# Patient Record
Sex: Male | Born: 1940
Health system: Southern US, Community
[De-identification: ages and names within clinical notes are randomized; demographics above are authoritative.]

## PROBLEM LIST (undated history)

## (undated) DIAGNOSIS — M1990 Unspecified osteoarthritis, unspecified site: Secondary | ICD-10-CM

## (undated) DIAGNOSIS — I3139 Other pericardial effusion (noninflammatory): Secondary | ICD-10-CM

## (undated) DIAGNOSIS — C61 Malignant neoplasm of prostate: Secondary | ICD-10-CM

## (undated) DIAGNOSIS — I471 Supraventricular tachycardia, unspecified: Secondary | ICD-10-CM

## (undated) DIAGNOSIS — I10 Essential (primary) hypertension: Secondary | ICD-10-CM

## (undated) DIAGNOSIS — E119 Type 2 diabetes mellitus without complications: Secondary | ICD-10-CM

## (undated) DIAGNOSIS — H409 Unspecified glaucoma: Secondary | ICD-10-CM

## (undated) DIAGNOSIS — Z87442 Personal history of urinary calculi: Secondary | ICD-10-CM

## (undated) DIAGNOSIS — R519 Headache, unspecified: Secondary | ICD-10-CM

## (undated) DIAGNOSIS — I480 Paroxysmal atrial fibrillation: Secondary | ICD-10-CM

## (undated) DIAGNOSIS — I313 Pericardial effusion (noninflammatory): Secondary | ICD-10-CM

## (undated) DIAGNOSIS — M316 Other giant cell arteritis: Secondary | ICD-10-CM

## (undated) DIAGNOSIS — I499 Cardiac arrhythmia, unspecified: Secondary | ICD-10-CM

## (undated) DIAGNOSIS — I2510 Atherosclerotic heart disease of native coronary artery without angina pectoris: Secondary | ICD-10-CM

## (undated) DIAGNOSIS — I214 Non-ST elevation (NSTEMI) myocardial infarction: Secondary | ICD-10-CM

## (undated) DIAGNOSIS — R51 Headache: Secondary | ICD-10-CM

## (undated) DIAGNOSIS — K219 Gastro-esophageal reflux disease without esophagitis: Secondary | ICD-10-CM

## (undated) DIAGNOSIS — M109 Gout, unspecified: Secondary | ICD-10-CM

## (undated) DIAGNOSIS — D649 Anemia, unspecified: Secondary | ICD-10-CM

## (undated) HISTORY — PX: FRACTURE SURGERY: SHX138

## (undated) HISTORY — DX: Pericardial effusion (noninflammatory): I31.3

## (undated) HISTORY — PX: KNEE ARTHROSCOPY: SHX127

## (undated) HISTORY — DX: Supraventricular tachycardia, unspecified: I47.10

## (undated) HISTORY — DX: Essential (primary) hypertension: I10

## (undated) HISTORY — DX: Paroxysmal atrial fibrillation: I48.0

## (undated) HISTORY — DX: Malignant neoplasm of prostate: C61

## (undated) HISTORY — DX: Other giant cell arteritis: M31.6

## (undated) HISTORY — DX: Other pericardial effusion (noninflammatory): I31.39

## (undated) HISTORY — DX: Atherosclerotic heart disease of native coronary artery without angina pectoris: I25.10

## (undated) HISTORY — DX: Supraventricular tachycardia: I47.1

---

## 2001-03-27 HISTORY — PX: ORIF TIBIA & FIBULA FRACTURES: SHX2131

## 2005-03-27 DIAGNOSIS — C61 Malignant neoplasm of prostate: Secondary | ICD-10-CM

## 2005-03-27 HISTORY — PX: INSERTION PROSTATE RADIATION SEED: SUR718

## 2005-03-27 HISTORY — PX: PERICARDIOCENTESIS: SHX2215

## 2005-03-27 HISTORY — DX: Malignant neoplasm of prostate: C61

## 2005-08-11 ENCOUNTER — Ambulatory Visit: Admission: RE | Admit: 2005-08-11 | Discharge: 2005-11-09 | Payer: Self-pay | Admitting: Radiation Oncology

## 2005-09-21 ENCOUNTER — Encounter: Admission: RE | Admit: 2005-09-21 | Discharge: 2005-09-21 | Payer: Self-pay | Admitting: Urology

## 2005-11-01 ENCOUNTER — Ambulatory Visit (HOSPITAL_BASED_OUTPATIENT_CLINIC_OR_DEPARTMENT_OTHER): Admission: RE | Admit: 2005-11-01 | Discharge: 2005-11-01 | Payer: Self-pay | Admitting: Urology

## 2005-11-23 ENCOUNTER — Ambulatory Visit: Admission: RE | Admit: 2005-11-23 | Discharge: 2005-12-29 | Payer: Self-pay | Admitting: Radiation Oncology

## 2006-03-14 ENCOUNTER — Ambulatory Visit: Payer: Self-pay | Admitting: Cardiology

## 2006-04-10 ENCOUNTER — Ambulatory Visit: Payer: Self-pay | Admitting: Cardiology

## 2006-04-27 ENCOUNTER — Ambulatory Visit: Payer: Self-pay | Admitting: Cardiology

## 2006-05-31 ENCOUNTER — Ambulatory Visit: Payer: Self-pay | Admitting: Cardiology

## 2007-02-08 ENCOUNTER — Ambulatory Visit: Payer: Self-pay | Admitting: Cardiology

## 2007-02-25 HISTORY — PX: PERICARDIAL WINDOW: SHX2213

## 2007-03-07 ENCOUNTER — Observation Stay (HOSPITAL_COMMUNITY): Admission: AD | Admit: 2007-03-07 | Discharge: 2007-03-09 | Payer: Self-pay | Admitting: Internal Medicine

## 2007-03-07 ENCOUNTER — Ambulatory Visit: Payer: Self-pay | Admitting: Internal Medicine

## 2007-03-07 ENCOUNTER — Ambulatory Visit: Payer: Self-pay

## 2007-03-07 ENCOUNTER — Encounter: Payer: Self-pay | Admitting: Internal Medicine

## 2007-03-08 ENCOUNTER — Encounter: Payer: Self-pay | Admitting: Cardiology

## 2007-03-12 ENCOUNTER — Ambulatory Visit: Payer: Self-pay | Admitting: Cardiology

## 2007-03-18 ENCOUNTER — Inpatient Hospital Stay (HOSPITAL_COMMUNITY): Admission: AD | Admit: 2007-03-18 | Discharge: 2007-03-26 | Payer: Self-pay | Admitting: Cardiology

## 2007-03-18 ENCOUNTER — Ambulatory Visit: Payer: Self-pay | Admitting: Internal Medicine

## 2007-03-18 ENCOUNTER — Ambulatory Visit: Payer: Self-pay | Admitting: Cardiology

## 2007-03-19 ENCOUNTER — Encounter: Payer: Self-pay | Admitting: Cardiology

## 2007-03-19 ENCOUNTER — Ambulatory Visit: Payer: Self-pay | Admitting: Cardiothoracic Surgery

## 2007-03-20 ENCOUNTER — Encounter: Payer: Self-pay | Admitting: Cardiothoracic Surgery

## 2007-03-20 ENCOUNTER — Encounter: Payer: Self-pay | Admitting: Cardiology

## 2007-03-22 ENCOUNTER — Encounter: Payer: Self-pay | Admitting: Cardiothoracic Surgery

## 2007-04-05 ENCOUNTER — Encounter: Admission: RE | Admit: 2007-04-05 | Discharge: 2007-04-05 | Payer: Self-pay | Admitting: Cardiothoracic Surgery

## 2007-04-05 ENCOUNTER — Ambulatory Visit: Payer: Self-pay | Admitting: Cardiology

## 2007-04-05 ENCOUNTER — Ambulatory Visit: Payer: Self-pay | Admitting: Cardiothoracic Surgery

## 2007-04-09 ENCOUNTER — Encounter: Payer: Self-pay | Admitting: Cardiology

## 2007-04-17 ENCOUNTER — Ambulatory Visit: Payer: Self-pay | Admitting: Internal Medicine

## 2007-06-19 ENCOUNTER — Ambulatory Visit: Payer: Self-pay | Admitting: Cardiology

## 2007-06-19 ENCOUNTER — Inpatient Hospital Stay (HOSPITAL_COMMUNITY): Admission: AD | Admit: 2007-06-19 | Discharge: 2007-06-20 | Payer: Self-pay | Admitting: Cardiovascular Disease

## 2007-06-19 ENCOUNTER — Ambulatory Visit: Payer: Self-pay | Admitting: Cardiothoracic Surgery

## 2007-06-19 ENCOUNTER — Ambulatory Visit: Payer: Self-pay | Admitting: Cardiovascular Disease

## 2007-06-19 ENCOUNTER — Encounter: Payer: Self-pay | Admitting: Cardiovascular Disease

## 2007-06-19 ENCOUNTER — Ambulatory Visit: Payer: Self-pay | Admitting: Oncology

## 2007-06-24 ENCOUNTER — Ambulatory Visit: Payer: Self-pay | Admitting: Cardiology

## 2007-07-10 ENCOUNTER — Ambulatory Visit: Payer: Self-pay | Admitting: Cardiology

## 2007-08-05 ENCOUNTER — Ambulatory Visit: Payer: Self-pay | Admitting: Cardiology

## 2007-09-12 ENCOUNTER — Ambulatory Visit: Payer: Self-pay | Admitting: Cardiology

## 2008-03-27 HISTORY — PX: FRACTURE SURGERY: SHX138

## 2008-05-04 ENCOUNTER — Ambulatory Visit: Payer: Self-pay | Admitting: Cardiology

## 2008-05-04 ENCOUNTER — Encounter: Payer: Self-pay | Admitting: Physician Assistant

## 2008-06-10 ENCOUNTER — Encounter: Payer: Self-pay | Admitting: Cardiology

## 2008-06-10 ENCOUNTER — Ambulatory Visit: Payer: Self-pay | Admitting: Cardiology

## 2008-11-19 ENCOUNTER — Encounter: Payer: Self-pay | Admitting: Cardiology

## 2008-11-23 ENCOUNTER — Encounter: Payer: Self-pay | Admitting: Cardiology

## 2008-12-25 ENCOUNTER — Ambulatory Visit: Payer: Self-pay | Admitting: Cardiology

## 2008-12-25 DIAGNOSIS — I10 Essential (primary) hypertension: Secondary | ICD-10-CM | POA: Insufficient documentation

## 2008-12-25 DIAGNOSIS — R002 Palpitations: Secondary | ICD-10-CM | POA: Insufficient documentation

## 2008-12-29 ENCOUNTER — Telehealth (INDEPENDENT_AMBULATORY_CARE_PROVIDER_SITE_OTHER): Payer: Self-pay | Admitting: *Deleted

## 2009-01-05 ENCOUNTER — Ambulatory Visit: Payer: Self-pay | Admitting: Cardiology

## 2009-01-06 ENCOUNTER — Encounter: Payer: Self-pay | Admitting: Cardiology

## 2009-01-08 ENCOUNTER — Encounter: Payer: Self-pay | Admitting: Physician Assistant

## 2009-02-03 ENCOUNTER — Encounter: Payer: Self-pay | Admitting: Cardiology

## 2009-07-08 ENCOUNTER — Encounter: Payer: Self-pay | Admitting: Cardiology

## 2009-08-18 ENCOUNTER — Ambulatory Visit: Payer: Self-pay | Admitting: Cardiology

## 2010-01-31 ENCOUNTER — Telehealth (INDEPENDENT_AMBULATORY_CARE_PROVIDER_SITE_OTHER): Payer: Self-pay | Admitting: *Deleted

## 2010-04-17 ENCOUNTER — Encounter: Payer: Self-pay | Admitting: Cardiothoracic Surgery

## 2010-04-26 NOTE — Letter (Signed)
Summary: Appointment- Rescheduled  [facility] [address]   [place], [place] [address]   Phone: [phone]  Fax: [phone]     July 08, 2009 MRN: [id number]     [name] [name] [address] [place], [place]  [address]     Dear [name],   Due to a change in our office schedule, your appointment on July 12, 2009 at  3:15pm must be changed.    Your new appointment will be Aug 18, 2009 @ 9:45am.  We look forward to participating in your health care needs.    Sincerely,  Glass blower/designer

## 2010-04-26 NOTE — Progress Notes (Signed)
Summary: fluid  Phone Note Other Incoming   Caller: [facility] Summary of Call: "Discuss my fluid."  Spoke with pt and thinks he may have issue with fluid.  C/O soreness in chest.  States not sure how to explain.  States that he does have OV with Dr. Reuel Boom tomorrow at 3:30.  Advised him to address issue with PMD first, if he feels need cardiac evaluation - can have him call for OV.  Patient verbalized understanding.  Initial call taken by: Hoover Brunette, LPN,  January 31, 2010 2:37 PM

## 2010-04-26 NOTE — Assessment & Plan Note (Signed)
Summary: 6 month fu -recv reminder vs   Visit Type:  Follow-up Primary Provider:  Dr. Garner Nash   History of Present Illness: the patient is a 70 year old male with history of idiopathic pericardial effusion as well as is her supraventricular tachycardia. The patient had an allergy to amiodarone and this was discontinued. He has not been placed on verapamil and has respondedd nicely to this therapy.the Holter monitor was obtained earlier this year which demonstrated normal sinus rhythm with no recurrent atrial arrhythmias or supraventricular tachycardia. The patient also has had no recurrence of his pericardial effusion.  He states is doing quite well. He denies any chest pain shortness of breath orthopnea PND. He reports no palpitations. He's wondering if he could have restless leg syndrome and reports some cramping at night. I pointed out to the patient that he has significant varicosities which are likely to improve his symptoms. His blood pressure is also somewhat poorly controlled in the office today.  Preventive Screening-Counseling & Management  Alcohol-Tobacco     Smoking Status: quit     Year Quit: 1967  Current Problems (verified): 1)  Pericardial Effusion  (ICD-423.9) 2)  Palpitations, Occasional  (ICD-785.1) 3)  Essential Hypertension, Benign  (ICD-401.1)  Current Medications (verified): 1)  Benazepril Hcl 20 Mg Tabs (Benazepril Hcl) .... Take 1 Tablet By Mouth Once A Day 2)  Metoprolol Succinate 50 Mg Xr24h-Tab (Metoprolol Succinate) .... Take 1 Tablet By Mouth Once A Day 3)  Uroxatral 10 Mg Xr24h-Tab (Alfuzosin Hcl) .... Take 1 Tablet By Mouth Once A Day At Bedtime 4)  Chlorthalidone 25 Mg Tabs (Chlorthalidone) .... Take 1 Tablet By Mouth Once A Day 5)  Multivitamins  Tabs (Multiple Vitamin) .... Take 1 Tablet By Mouth Once A Day 6)  Verapamil Hcl Cr 180 Mg Cr-Tabs (Verapamil Hcl) .... Take 1 Tablet By Mouth Once A Day 7)  Aspir-Low 81 Mg Tbec (Aspirin) .... Take 1 Tablet By  Mouth Once A Day 8)  Fish Oil Triple Strength 1360 Mg Caps (Omega-3 Fatty Acids) .... Take 1 Tablet By Mouth Once A Day 1400mg  Capsule  Allergies (verified): 1)  ! Amiodarone Hcl 2)  ! Asa  Comments:  Nurse/Medical Assistant: The patient's medications and allergies were reviewed with the patient and were updated in the Medication and Allergy Lists. List reviewed.  Past History:  Family History: Last updated: 08/18/2009 noncontributory  Social History: Last updated: 08/18/2009 nonsmoker  Risk Factors: Smoking Status: quit (08/18/2009)  Past Medical History: Reviewed history from 12/25/2008 and no changes required. 1. History of idiopathic, recurrent pericardial effusion.     a.     Status post pericardial window.     b.     Moderate-sized effusion by echocardiogram, June 2009. 2. History of supraventricular tachycardia, quiescent. 3. Normal LVF. 4. AMIODARONE allergy, secondary to rash. 5. ASPIRIN allergy, secondary to hives. 6. Hypertension.   Family History: Reviewed history and no changes required. noncontributory  Social History: Reviewed history and no changes required. nonsmoker  Vital Signs:  Patient profile:   70 year old male Height:      72 inches Weight:      225 pounds Pulse rate:   57 / minute BP sitting:   152 / 79  (left arm) Cuff size:   large  Vitals Entered By: Carlye Grippe (Aug 18, 2009 9:44 AM)   Physical Exam  Additional Exam:  General: Well-developed, well-nourished in no distress head: Normocephalic and atraumatic eyes PERRLA/EOMI intact, conjunctiva and lids normal nose: No  deformity or lesions mouth normal dentition, normal posterior pharynx neck: Supple, no JVD.  No masses, thyromegaly or abnormal cervical nodes lungs: Normal breath sounds bilaterally without wheezing.  Normal percussion heart: regular rate and rhythm with normal S1 and S2, no S3 or S4.  PMI is normal.  No pathological murmurs abdomen: Normal bowel sounds,  abdomen is soft and nontender without masses, organomegaly or hernias noted.  No hepatosplenomegaly musculoskeletal: Back normal, normal gait muscle strength and tone normal pulsus: Pulse is normal in all 4 extremities Extremities: No peripheral pitting edema neurologic: Alert and oriented x 3 skin: Intact without lesions or rashes cervical nodes: No significant adenopathy psychologic: Normal affect    Impression & Recommendations:  Problem # 1:  PERICARDIAL EFFUSION (ICD-423.9) no recurrence His updated medication list for this problem includes:    Benazepril Hcl 20 Mg Tabs (Benazepril hcl) .Marland Kitchen... Take 1 tablet by mouth once a day    Chlorthalidone 25 Mg Tabs (Chlorthalidone) .Marland Kitchen... Take 1 tablet by mouth once a day  Problem # 2:  PALPITATIONS, OCCASIONAL (ICD-785.1) patient is doing well on verapamil and will continue this. No indication for dose up titration. His updated medication list for this problem includes:    Benazepril Hcl 20 Mg Tabs (Benazepril hcl) .Marland Kitchen... Take 1 tablet by mouth once a day    Metoprolol Succinate 50 Mg Xr24h-tab (Metoprolol succinate) .Marland Kitchen... Take 1 tablet by mouth once a day    Verapamil Hcl Cr 180 Mg Cr-tabs (Verapamil hcl) .Marland Kitchen... Take 1 tablet by mouth once a day    Aspir-low 81 Mg Tbec (Aspirin) .Marland Kitchen... Take 1 tablet by mouth once a day  Problem # 3:  ESSENTIAL HYPERTENSION, BENIGN (ICD-401.1) I recommend to the patient to discontinue Lasix as he does not have any evidence of volume overload and really has no congestive heart failure. We will switch this to chlorthalidone which is a better antihypertensive agent and Lasix in the setting or tingling conjunction with  Benzapril and Verapamil His updated medication list for this problem includes:    Benazepril Hcl 20 Mg Tabs (Benazepril hcl) .Marland Kitchen... Take 1 tablet by mouth once a day    Metoprolol Succinate 50 Mg Xr24h-tab (Metoprolol succinate) .Marland Kitchen... Take 1 tablet by mouth once a day    Chlorthalidone 25 Mg Tabs  (Chlorthalidone) .Marland Kitchen... Take 1 tablet by mouth once a day    Verapamil Hcl Cr 180 Mg Cr-tabs (Verapamil hcl) .Marland Kitchen... Take 1 tablet by mouth once a day    Aspir-low 81 Mg Tbec (Aspirin) .Marland Kitchen... Take 1 tablet by mouth once a day  Patient Instructions: 1)  Change Lasix to Chlorthalidone 25mg  daily 2)  Follow up in  1 year Prescriptions: CHLORTHALIDONE 25 MG TABS (CHLORTHALIDONE) Take 1 tablet by mouth once a day  #30 x 6   Entered by:   Hoover Brunette, LPN   Authorized by:   Lewayne Bunting, MD, [facility]   Signed by:   Hoover Brunette, LPN on 02/77/4128   Method used:   Electronically to        [facility] (retail)       [address]       [place], [place]  [address]       Ph: [phone]       Fax: [phone]   RxID:   [id number]   Handout requested.

## 2010-07-30 ENCOUNTER — Other Ambulatory Visit: Payer: Self-pay | Admitting: Cardiology

## 2010-08-09 NOTE — Assessment & Plan Note (Signed)
[facility]                          [facility] NOTE   LEVEON, [name] [name]                        MRN:          [id number]  DATE:05/04/2008                            DOB:          Aug 21, 1940    REASON FOR VISIT:  Scheduled followup.   Mr. Rote reports no interim development of exertional angina pectoris,  dyspnea, or tachypalpitations.  He has done extremely well since his  last visit here in May of last year.  At that time, Dr. Andee Lineman referred  him for a surveillance echocardiogram, given his history of recurrent  pericardial effusion.  This echo yielded evidence of a moderate  pericardial effusion, but with no evidence of tamponade.  Left  ventricular function remained normal (EF 60-65%).   CURRENT MEDICATIONS:  1. Lasix 20 daily.  2. Metoprolol ER 50 daily.  3. Benazepril 10 daily.  4. Uroxatral 10 at bedtime.   PHYSICAL EXAMINATION:  VITAL SIGNS:  Blood pressure 152/73, pulse 66,  regular, weight 231 (up 5).  GENERAL:  A 70 year old male, sitting upright, no distress.  HEENT:  Normocephalic, atraumatic.  NECK:  Palpable bilateral carotid pulse without bruits; no JVD at 90  degrees.  LUNGS:  Clear to auscultation in all fields.  HEART:  Regular rate and rhythm.  No significant murmurs.  No rubs.  ABDOMEN:  Benign.  EXTREMITIES:  No edema.  NEURO:  No focal deficit.   IMPRESSION:  1. History of idiopathic, recurrent pericardial effusion.      a.     Status post pericardial window.      b.     Moderate-sized effusion by echocardiogram, June 2009.  2. History of supraventricular tachycardia, quiescent.  3. Normal LVF.  4. AMIODARONE allergy, secondary to rash.  5. ASPIRIN allergy, secondary to hives.  6. Hypertension.   PLAN:  1. Surveillance 2-D echocardiogram for reassessment of his pericardial      effusion, to ensure that this has decreased in size.  If, however,      this suggests a similar or larger sized effusion,  then we will need      to consider further evaluation.  2. The patient is advised to maintain regular followup with Dr.      Reuel Boom, his primary physician, particularly with respect to his      hypertension.  He was recently started on an ACE inhibitor,      and we will defer to Dr. Reuel Boom as to whether or not this needs to      be increased.  3. Schedule return clinic followup with myself and Dr. Andee Lineman in 6      months.      Gene Serpe, PA-C  Electronically Signed      Learta Codding, MD,FACC  Electronically Signed   GS/MedQ  DD: 05/04/2008  DT: 05/05/2008  Job #: [id number]   cc:   Donzetta Sprung

## 2010-08-09 NOTE — Cardiovascular Report (Signed)
[name] [name], [name] [name]                 ACCOUNT NO.:  [id number]   MEDICAL RECORD NO.:  [id number]          PATIENT TYPE:  INP   LOCATION:  2014                         FACILITY:  [facility]   PHYSICIAN:  Noralyn Pick. Eden Emms, MD, FACCDATE OF BIRTH:  05/20/1940   DATE OF PROCEDURE:  06/19/2007  DATE OF DISCHARGE:                            CARDIAC CATHETERIZATION   INDICATIONS:  Large pericardial effusion.  Check right heart pressures,  rule out tamponade.   [name] [name] is a 70 year old patient who has had a previous large  pericardial effusion with pericardiocentesis by Dr. Juanda Chance, I believe in  December 2008.  He subsequently had recurrence of his pericardial  effusion and had a pericardial window by Dr. Donata Clay.  He was  transferred down from [facility] for recurrence of his  pericardial effusion.  Dr. Andee Lineman requested right heart catheterization  to be done to assess right heart pressures.   We initially tried to do a right IJ catheter insertion, however, our  wire continually went out the right subclavian and axillary vein.  We  therefore put a 7-French sheath in right femoral vein.   The mean right atrial pressure was 7, RV pressure was 34/7, PA pressure  was 30/6, mean pulmonary capillary wedge pressure was 12.  At the time  of this dictation, Fick cardiac output was calculated at 5.08 with a  cardiac index of 2.25.   The patient received 4 mg Versed during procedure.   IMPRESSION:  The patient's right heart pressures are quite low.  During  his last pericardiocentesis, the post tap right atrial pressure was 10,  pretap was 16.  There does not appear to be any evidence of significant  tamponade at this point.  These findings will be discussed with Dr.  Andee Lineman and Dr. Donata Clay.      Noralyn Pick. Eden Emms, MD, [facility]  Electronically Signed     PCN/MEDQ  D:  06/19/2007  T:  06/20/2007  Job:  [id number]

## 2010-08-09 NOTE — Assessment & Plan Note (Signed)
[facility]                          [facility] NOTE   [name], [name]                        MRN:          [id number]  DATE:08/05/2007                            DOB:          April 05, 1940    REFERRING PHYSICIAN:  Donzetta Sprung   HISTORY OF PRESENT ILLNESS:  The patient is a 70 year old male with a  history of recurrent pericardial effusion.  The patient had a followup  echocardiogram done,which showed no evidence of tamponade physiology and  with some decrease in the effusion size.  The patient states that he is  overall doing extremely well.  He denies any chest pain, shortness of  breath, orthopnea, PND.  He has no palpitations or syncope.   MEDICATIONS:  1. Uroxatral 10 mg p.o. nightly.  2. Lasix 20 mg p.o. daily.  3. Metoprolol ER 50mg  p.o. daily.   PHYSICAL EXAMINATION:  VITAL SIGNS:  Blood pressure 139/77.  Heart rate  is 80 beats per minute.  Weight is 225 pounds.  NECK:  Normal carotid upstroke.  No carotid bruits.  LUNGS:  Clear breath sounds bilaterally.  HEART:  Regular rate and rhythm  with normal S1, S2.  ABDOMEN:  Soft.  EXTREMITY:  No cyanosis, clubbing or edema.   PROBLEM:  1. History of supraventricular tachycardia, quiescent.  2. Idiopathic recurrent pericardial effusion status post pericardial      window.  3. Normal left ventricular function.  4. Temporal arteritis.  5. History of rash secondary to amiodarone.   PLAN:  1. The patient is doing extremely well.  His echocardiographic study      appears to have improved.  2. I told him we will follow up with one more echocardiogram in one      month.  3. Patient can continue on Lasix, which he states has helped him quite      a bit.  4. Further followup will be Dr. Reuel Boom.    Learta Codding, MD,FACC  Electronically Signed   GED/MedQ  DD: 08/05/2007  DT: 08/05/2007  Job #: [id number]   cc:   Donzetta Sprung

## 2010-08-09 NOTE — Op Note (Signed)
[name], [name] [name]                 ACCOUNT NO.:  [id number]   MEDICAL RECORD NO.:  [id number]          PATIENT TYPE:  INP   LOCATION:  2550                         FACILITY:  [facility]   PHYSICIAN:  Guadalupe Maple, M.D.  DATE OF BIRTH:  1940/10/20   DATE OF PROCEDURE:  03/20/2007  DATE OF DISCHARGE:                               OPERATIVE REPORT   PROCEDURE:  Intraoperative transesophageal echocardiography.   [name] is a 70 year old white male with a history of idiopathic  pericarditis who is now scheduled to undergo pericardial window and  drainage by Kerin Perna, M.D.  The patient was brought to the  operating room at [facility].  General anesthesia was induced  without difficulty.  The trachea was intubated without difficulty.  The  transesophageal echocardiography probe was then inserted into the  esophagus without difficulty.   IMPRESSION:  1. Pericardium.  There was a moderate pericardial effusion which did      not appear to be producing any significant hemodynamic disturbance.      The largest extent of the effusion was 1.75 cm in diameter in the      anterior lateral aspects of the left ventricle, the fluid had      smaller depth in other areas.  There did not appear to be any      diastolic collapse of the right atrium, right ventricle.  2. Left ventricle.  The left ventricular contractility appeared      vigorous.  The ventricular cavity appeared small and slightly      underfilled, but there was vigorous contractility in all segments      interrogated with ejection fraction estimated at 60-65%.  3. Right ventricle.  The right ventricle appeared to be within normal      limits of size with good contractility of the right ventricular      free wall and no apparent diastolic collapse.  There was some      degree at times of invagination of the left ventricular free wall      slightly __________ , but no evidence of collapse.  4. Right atrium.  There was  no evidence of right atrial collapse.  5. Interatrial septum.  The interatrial septum was intact without      evidence of patent foramen ovale or atrial septum defect.  6. Left atrium.  There was no thrombus noted in the left atrium and      left atrial appendage.  7. Aortic valve.  The aortic valve was trileaflet and opened normally      without evidence of aortic insufficiency.  8. Mitral valve.  The mitral valve showed leaflets.  The leaflets do      not appear to be thickened.  There was trace mitral insufficiency      and there was some evidence of systolic anterior motion of the      mitral leaflets consistent with underfilling of the left ventricle.           ______________________________  Guadalupe Maple, M.D.     DCJ/MEDQ  D:  03/20/2007  T:  03/20/2007  Job:  [id number]   cc:   Guadalupe Maple, M.D.

## 2010-08-09 NOTE — Op Note (Signed)
NAMEMOISHE, SCHELLENBERG                 ACCOUNT NO.:  [id number]   MEDICAL RECORD NO.:  [id number]          PATIENT TYPE:  INP   LOCATION:  3305                         FACILITY:  [facility]   PHYSICIAN:  Duke Salvia, MD, FACCDATE OF BIRTH:  1940-10-30   DATE OF PROCEDURE:  03/25/2007  DATE OF DISCHARGE:  03/26/2007                               OPERATIVE REPORT   PREOPERATIVE DIAGNOSIS:  Supraventricular tachycardia.   POSTOPERATIVE DIAGNOSIS:  Atrial fibrillation.   Following obtaining informed consent, the patient was brought to the  electrophysiology laboratory and placed on the fluoroscopic table in  supine position.  After routine prep and drape, cardiac catheterization  was performed with local anesthesia and conscious sedation.  Noninvasive  blood pressure monitoring, transcutaneous oxygen saturation monitoring  and end-tidal CO2 monitoring were performed.   Following the procedure the catheters were removed, hemostasis was  obtained and the patient was transferred to the floor in stable  condition.   CATHETERS:  A 5-French quadripolar catheter was inserted via the femoral  vein to the AV junction.  A 5-French quadripolar catheter was inserted via the femoral vein to the  right ventricular apex.  A 6-French octapolar catheter was inserted via the right femoral vein to  the coronary sinus.   Surface leads 1, aVF and V1 were monitored continuously throughout the  procedure.  Initial measurements demonstrated sinus rhythm with an RR of  interval 72 and 9 msec, a PR interval of 105 msec, a QRS duration of 107  msec, a QT interval 374 msec, P-wave duration of 79 msec, the HV  interval was 45, the HV interval was 51.   During ventricular programmed stimulation to begin to look at retrograde  conduction, we found VA Wenckebach 300 msec.  However, the patient then  developed atrial fibrillation, which was not all that much of a surprise  in the context his pericarditis.  However,  following cardioversion he  had recurrence of atrial fibrillation precluding further evaluation and  the procedure was terminated at that point.  The catheters were removed,  the sheaths were removed, and the patient was transferred back to the  floor in stable condition.      Duke Salvia, MD, [facility]  Electronically Signed     SCK/MEDQ  D:  06/13/2007  T:  06/13/2007  Job:  [id number]   cc:   [facility] Niece Office

## 2010-08-09 NOTE — Discharge Summary (Signed)
NAMECORREY, WEIDNER                 ACCOUNT NO.:  [id number]   MEDICAL RECORD NO.:  [id number]          PATIENT TYPE:  INP   LOCATION:  2014                         FACILITY:  [facility]   PHYSICIAN:  Noralyn Pick. Eden Emms, MD, FACCDATE OF BIRTH:  1940/07/30   DATE OF ADMISSION:  06/19/2007  DATE OF DISCHARGE:  06/20/2007                               DISCHARGE SUMMARY   PROCEDURES:  1. Right heart catheterization.  2. 2D echocardiogram.  3. 2D chest x-ray.   PRIMARY FINAL DISCHARGE DIAGNOSIS:  Pericardial effusion, no tamponade  at this time.   SECONDARY DIAGNOSES:  1. Status post pericardial effusion, was diagnosed in December 2008,      treated with pericardiocentesis removing 1300 mL of fluid and      recurrence resulting in subxiphoid pericardial window in December      2008.  2. Supraventricular tachycardia with atrial fibrillation also seen.  3. 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  4. Obesity.  5. Hypertension.  6. Bradycardia.  7. Status post Cardiolite showing possible scar, but no evidence of      ischemia.  8. Family history of coronary artery disease in his mother with cancer      in 2 brothers and father.  9. History of intolerance to AMIODARONE with possible drug rash.  10.Hypokalemia.   TIME OF DISCHARGE:  39 minutes.   HOSPITAL COURSE:  Mr. Coonradt is a 70 year old male with a history of  pericardial effusion, first diagnosed in December, but recurrent in  December resulting in a pericardial window.  He had tachy palpitations  on the day of admission with a heart rate of 150-160.  He was given oral  Lopressor and came to the [facility] Emergency Room.  He had a chest x-ray  in the emergency room, which showed increased heart size and a stat  bedside echocardiogram showed a large pericardial effusion.  There was  concern for tamponade physiology, and he was transferred urgently to  [facility].   Mr. Mcinturff had a right heart catheterization which showed mean right  atrial pressure of 7 and RV pressure of 34/7 with PA pressures of 30/6  and, and mean pulmonary capillary wedge of 12.  The  Fick cardiac output  was 5.08 with an index of 2.25.  Dr. Eden Emms evaluated the findings and  felt that there was no significant tamponade.  The findings  were  discussed with Dr. Donata Clay and Dr. Andee Lineman.  Upon review of the  findings from the pericardial window previously placed, the only  abnormality was increased lymphocytes, and there was some concern for  lymphoma.  A hem consult was called.   Mr. Raether was seen by Dr. Darrold Span who felt that the cytology of the  fluid x2 as well as the pathology of the previous pericardial biopsy  were negative for malignancy.  There were no CT findings to suggest  lymphoma, and the most recent blood work did not suggest hematologic  problems.  Dr. Darrold Span felt that if he went for a repeat pericardial  window, the surgeon should give a history directly to the  pathologist.  Cytology has been done and would generally be appropriate for the  pericardial fluid.   A 2D echocardiogram was performed and this showed an EF of 65% with  moderately increased left ventricular wall thickness and abnormal mitral  inflow, respirophasic changes with a large pericardial effusion  circumferential to the heart with moderate right ventricular chamber  collapse.  On June 20, 2007, Dr. Eden Emms reviewed the echocardiogram and  felt that he had a moderate effusion, but no tamponade and the problems  were possible apical fat pad.  Because there was no tamponade physiology  seen and his right heart pressures were low, he was felt stable for  discharge and is to keep his followup appointment with Dr. Andee Lineman this  coming Monday and get a followup echocardiogram in 4 weeks or sooner if  needed.  Because of his tachy palpitations, Mr. Cueva has been placed on  a beta-blocker and is to continue this daily instead of only p.r.n.  For  now, his benazepril has  been discontinued but this can be restarted if  he needs better blood pressure control.  Dr. Eden Emms considered Mr. Graziani  stable for discharge on June 20, 2007.   DISCHARGE INSTRUCTIONS:  His activity level is to be increased slowly.  He is aware that if he has further tachy palpitations, he is to call 911  or to come directly to the emergency room.  He also understands that if  he develops increasing shortness of breath, swelling, or weight gain he  is to call our office immediately.  He is encouraged to stick to a low-  sodium heart-healthy diet.  He is to call our office for any problems  with right heart catheter site.  He is to keep his appointment on June 24, 2007, with Dr. Andee Lineman, and he is to follow up with Dr. Reuel Boom and  Dr. Donata Clay as scheduled.   DISCHARGE MEDICATIONS:  1. Metoprolol 25 mg b.i.d.  2. Benazepril is on hold.  3. Uroxatral 10 mg daily.  4. Vitamins as at home.      Theodore Demark, PA-C      Noralyn Pick. Eden Emms, MD, [facility]  Electronically Signed    RB/MEDQ  D:  06/20/2007  T:  06/21/2007  Job:  [id number]   cc:   Donzetta Sprung  Fax: [phone]   Kerin Perna, M.D.  [address]  [place]  [place] [address]   [facility]  [place], [place]

## 2010-08-09 NOTE — Cardiovascular Report (Signed)
[name] [name], [name] [name]                 ACCOUNT NO.:  [id number]   MEDICAL RECORD NO.:  [id number]          PATIENT TYPE:  INP   LOCATION:  2807                         FACILITY:  [facility]   PHYSICIAN:  Everardo Beals. Juanda Chance, MD, FACCDATE OF BIRTH:  08/29/1940   DATE OF PROCEDURE:  03/07/2007  DATE OF DISCHARGE:                            CARDIAC CATHETERIZATION   CLINICAL HISTORY:  Mr. Lauf is 70 year old and has a history of  supraventricular tachycardia.  He was referred today to Dr. Graciela Husbands for  evaluation of supraventricular tachycardia for a possible ablation and  complained of shortness of breath.  An echocardiogram was done which  showed a large circumferential pericardial effusion with right atrial  collapse, suggesting tamponade.  He was transferred directly over to  [facility] for a pericardiocentesis.   PROCEDURE:  The patient was reasonably comfortable and was able to lie  flat.  Right heart catheterization was performed percutaneously via the  right femoral vein using a venous sheath and Swan-Ganz thermodilution  catheter.  A pericardiocentesis was performed using the subxiphoid  approach.  We put a wedge under the patient's chest to elevate his  shoulders and back.  The subcutaneous tissue was anesthetized with 1%  local Xylocaine.  After obtaining an echocardiogram from the subxiphoid  approach to document there was a large amount of fluids that was  accessible by this approach, we used an 18-gauge needle with ECG  guidance and entered the pericardial space.  There was no injury current  seen.  We passed a wire through the needle and then advanced a catheter  over the wire into the pericardial space.  We measured pressure in the  pericardial space and in the right atrium simultaneously.  We then  removed approximately 1300 of yellow fluid.  The patient said he was not  feeling extremely bad prior to this, but said he realized that he felt  much better after removal of the fluid.   The fluid was sent for routine  and acid fast cultures, chemistries, cell count, and cytology.   HEMODYNAMIC DATA:  Before pericardiocentesis, the right atrial pressure  was 16.  The pulmonary artery pressure was 39/17 with a mean of 27.  The  pulmonary wedge pressure was 17 and the pulmonary artery saturation was  67%.  The pericardial pressure was 16 as well.  Following up  pericardiocentesis, the pericardial pressure fell to 0 and the right  atrial pressure fell to 10 and the pulmonary artery saturation improved  to 79%.   CONCLUSION:  1. Pericardial tamponade.  2. Pericardiocentesis with removal of 1300 mL of yellow fluid with a      fall in the right atrial pressure from 16 to 10, a fall in the      pericardial pressure from 16 to 0 and improvement in the pulmonary      artery saturation from 67% to 79%.   DISPOSITION:  The patient returned to the coronary care unit for further  observation.  The etiology of the effusion is not clear.  The patient  does have a history of prostate  cancer, but this would be an unusual  presentation for prostate cancer.  We will plan to get a CT of the chest  and then decide about further evaluation depending on the results of  this and our studies on the pericardial fluid.      Bruce Elvera Lennox Juanda Chance, MD, [facility]  Electronically Signed     BRB/MEDQ  D:  03/07/2007  T:  03/08/2007  Job:  [id number]   cc:   Noah Charon, MD,FACC  Duke Salvia, MD, [facility]  Noralyn Pick. Eden Emms, MD, [facility]  [facility]

## 2010-08-09 NOTE — Assessment & Plan Note (Signed)
[facility]                          [facility] OFFICE NOTE   [name] [name], [name] [name]                        MRN:          [id number]  DATE:06/24/2007                            DOB:          03/01/1941    REFERRING PHYSICIAN:  Dr. Garner Nash   HISTORY OF PRESENT ILLNESS:  The patient is a 70 year old male with a  history of idiopathic and  recurrent pericardial effusion.  The patient  also has a history of supraventricular tachycardia previously seen by  Dr. Graciela Husbands.  The patient presented to the emergency room a week ago with  palpitations.  I had recommended to take some additional beta-blocker,  by the time the patient got to the ER, he was back in normal sinus  rhythm, but on evaluation a chest x-ray demonstrated massive  cardiomegaly.  Bedside echo demonstrated a recurrent large pericardial  effusion.  Of note is that this patient had in December a pericardial  window placed by Dr. Donata Clay.  The patient was sent to [facility] for right heart catheterization to determine hemodynamic  significance of this effusion as there was suggestion of tamponade  physiology by echo.  Right heart cath pressures were not suggestive of  tamponade.  The patient was seen by Dr. Darrold Span for possible hematologic  malignancy.  He had a full body CT scan done which showed no evidence of  lymphoma.  The conclusion was that it was unlike this patient had a  malignant effusion.  Prior pericardiocentesis, and at the time of  window, no malignant cells were found either.  The patient now presents  for follow-up after his discharge . He had an echocardiographic study  done this morning which demonstrated persistent amount of __________  pericardial effusion, albeit with no definite hemodynamic compromise.  The patient did not have any recurrence of palpitations.   MEDICATIONS:  1. Uroxatral 10 mg p.o. q.h.s.  2. Metoprolol 25 mg p.o. b.i.d.   PHYSICAL  EXAMINATION:  VITAL SIGNS:  Blood pressure 134/74, heart rate  68, weights 226 pounds.  NECK:  Normal carotid stroke, no carotid bruits.  LUNGS:  Clear breath sounds bilaterally.  HEART:  Regular rate and rhythm.  Normal S1-S2 and no murmur, rubs or  gallops.  Heart sounds are somewhat distant.  ABDOMEN:  Soft, nontender.  No rebound or guarding.  Good bowel sounds.  Extremity exam no cyanosis, clubbing or edema.   PROBLEM LIST:  1. History of supraventricular tachycardia.  2. Idiopathic recurrent pericardial effusion status post pericardial      window.  3. Normal left ventricular function.  4. Temporal arthritis.  5. History of rash secondary to amiodarone.   PLAN:  1. The patient will need close surveillance and will repeat a 2-D      echocardiogram in 2 weeks to monitor his pericardial effusion.  2. The patient may require a repeat pericardial window and this will      be discussed with Dr. Donata Clay.  3. We will also consider referral to [facility] to rule out  possible rheumatologic causes for the patient's pericardial      effusion.  Of note is that the patient has a history of temporal      arthritis and may well have collagen vascular disease.  4. I have also switched the patient's metoprolol to metoprolol ER 50      mg p.o. daily.  The patient will follow up with [name] in 3-4 weeks or      earlier if he has a recurrent large effusion.     Learta Codding, MD,FACC  Electronically Signed    GED/MedQ  DD: 06/24/2007  DT: 06/24/2007  Job #: [id number]   cc:   Donzetta Sprung

## 2010-08-09 NOTE — Assessment & Plan Note (Signed)
OFFICE VISIT   [name] [name], [name] [name]  DOB:  Jan 30, 1941                                        April 05, 2007  CHART #:  [id number]   CURRENT PROBLEMS:  1. Status post subxiphoid pericardial window for a recurrent large      pericardial effusion, 03/20/2007.  2. History of SVT status post electrophysiology ablation.  3. Hypertension.  4. History of adenocarcinoma of the prostate.  5. History of temporal arteritis, resoled.   PRESENT ILLNESS:  [name] [name] returns for his first office visit after  being hospitalized for a large pericardial effusion and early signs of  tamponade, which was treated with a pericardiocentesis followed by a  subxiphoid pericardial window for evidence of recurrent effusion.  The  etiology of the effusion was probably post viral-idiopathic.  The  cultures and cytologies were negative and the appearance of the  epicardium and pericardium was fairly normal.  He has done well since  his hospital discharge.  He recently developed a rash and his amiodarone  was stopped by Dr. Andee Lineman.  He has had no significant pain over the  subxiphoid incision   PHYSICAL EXAMINATION:  VITAL SIGNS:  Blood pressure 120/70.  Pulse:  70.  Respirations:  18.  O2 saturation on room air 92%.  GENERAL:  He is alert and comfortable.  LUNGS:  Breath sounds are clear and equal.  HEART:  Cardiac rhythm is regular and there is no pericardial friction  rub.  The subxiphoid incision and chest tube site are healing well.  There is no peri phal edema.   LABORATORY DATA:  PA and lateral chest x-ray shows cardiomegaly but  clear lung fields with no pleural effusion.  No pulmonary infiltrates or  edema.   IMPRESSION/PLAN:  The patient has done well three weeks following  subxiphoid pericardial window.  I told him he could resume driving and  light activities, but to avoid heavy lifting or strenuous activity for  another three weeks.  He will return here as needed.   Kerin Perna, M.[name].  Electronically Signed   PV/MEDQ  [name]:  04/05/2007  T:  04/05/2007  Job:  [id number]   cc:   Learta Codding, MD,FACC

## 2010-08-09 NOTE — Assessment & Plan Note (Signed)
[facility]                          [facility] OFFICE NOTE   PEARSE, SHIFFLER                        MRN:          [id number]  DATE:04/05/2007                            DOB:          1940-08-04    REFERRING PHYSICIAN:  Dr. Reuel Boom   REASON FOR OFFICE VISIT:  Followup after recent hospitalization for  recurrent pericardial effusion.   HISTORY OF PRESENT ILLNESS:  The patient is a 70 year old male with a  history of supraventricular tachycardia.  The patient was last year  referred to Dr. Graciela Husbands for possible radiofrequency ablation.  During that  office visit, a routine echocardiographic study demonstrated a large  pericardial effusion.  The patient was referred for pericardiocentesis.  There was evidence of tamponade physiology by right heart hemodynamics.  Subsequently, the patient was treated with a pericardial drain and in  followup, he had a repeat echocardiographic study which showed the  recurrent small pericardial effusion.  He then developed again a larger  pericardial effusion and now required at the end of December, a  pericardial window performed by Dr. Donata Clay.  I reviewed the patient's  diagnostic studies including his rheumatoid factor, ANA and sed rate and  there were all within normal limits.  The pericardial effusion fluid  demonstrated a leukocytosis of 13,000, which were predominantly  lymphocytes.  The patient during that hospitalization was also started  on amiodarone as treatment for his supraventricular tachycardia.  The  patient had an electrophysiological study originally for AV nodal  reentrant tachycardia, but aborted secondary to recurrent atrial  fibrillation which relapsed despite multiple cardioversions and  therefore the initiation of amiodarone drug therapy.  The patient now  presents to the office and he states that he been doing well.  However,  he has developed a significant rash on the chest and buttocks  as well as  legs which is maculopapular in appearance and itching; he noticed this  after he started to amiodarone drug therapy.  He denies, however, any  fever or chills.   MEDICATIONS:  1. Uroxatral 10 mg p.o. nightly.  2. Pacerone 200 mg two in the morning and two in the evening.  3. Benazepril 10 mg p.o. daily.  4. Toprol was discontinued.   PHYSICAL EXAMINATION:  VITAL SIGNS:  Blood pressure is 127/67 mmHg,  heart rate 67 beats per minute, weight is 217 pounds.  Neck:  Normal carotid upstroke.  No carotid bruits.  LUNGS:  Clear breath sounds bilaterally.  HEART:  Regular rate and rhythm.  Normal S1, S2.  No murmur, rubs or  gallops.  ABDOMEN:  Soft, nontender.  No rebound or guarding.  Good bowel sounds.  EXTREMITIES:  No cyanosis, clubbing or edema.  SKIN:  Exam as outlined above.   PROBLEM LIST:  1. Rule out atrioventricular reentrant tachycardia (supraventricular      tachycardia).  2. Atrial fibrillation during electrophysiology study.  3. Amiodarone drug therapy secondary to number #2.  4. Currently normal sinus rhythm.  5. Pericardial effusion of unclear etiology, possibly idiopathic      connective tissue disease, workup negative  and no definite      infectious etiology.  6. Small scar by Cardiolite previously, but no ischemia.  7. Left ventricular hypertrophy.  8. History of hypertension.   PLAN:  1. The patient, from the perspective of pericardial effusion appears      to be doing well.  The etiology of this is not clear and it appears      to be idiopathic.  2. The patient has a significant skin rash which is quite extensive      and this may well be secondary to amiodarone.  We discontinued this      and gave him a Medrol Dosepak as well as Zyrtec and Benadryl cream      to apply.  3. The patient will follow up with me on Monday to review his rash.      If worsened, we will send to the dermatologist.  4. Amiodarone was discontinued and we need to follow the  patient      carefully in regards to recurrent atrial fibrillation.  He is      currently in normal sinus rhythm.     Learta Codding, MD,FACC  Electronically Signed    GED/MedQ  DD: 04/07/2007  DT: 04/08/2007  Job #: [id number]   cc:   Donzetta Sprung

## 2010-08-09 NOTE — Discharge Summary (Signed)
[name] [name], [name] [name]                 ACCOUNT NO.:  [id number]   MEDICAL RECORD NO.:  [id number]          PATIENT TYPE:  INP   LOCATION:  3305                         FACILITY:  [facility]   PHYSICIAN:  Maple Mirza, PA   DATE OF BIRTH:  02-28-1941   DATE OF ADMISSION:  03/18/2007  DATE OF DISCHARGE:  03/26/2007                               DISCHARGE SUMMARY   ALLERGIES:  Aspirin caused intolerance to discharge greater than 45  minutes.   FINAL DIAGNOSES:  1. Recurrent pericardial effusion, discharging day #6 status post      pericardial window by Cardiac Surgery.  2. Echocardiogram on March 19, 2007 with ejection fraction 70-80%      and small pericardial effusion.  3. Pericardial window pathology shows mild chronic inflammation and      fibrosis but no malignancy.  4. Bradycardia and junctional escape on Cardizem and metoprolol and      now both discontinued.  5. Paroxysmal atrial fibrillation, discharging day #1 status post      electrophysiology study for possible ablation of AVNRT, but atrial      fibrillation intervenes.  The study was aborted secondary to      recurrent persistent atrial fibrillation despite repeated      cardioversions.  The patient was started on intravenous amiodarone.      a.     Spontaneous conversion to sinus rhythm overnight on       amiodarone after electrophysiology study.      b.     Home on amiodarone oral.      c.     Amiodarone x4 weeks while pericardial inflammation resolves.   SECONDARY DIAGNOSES:  1. Hypertension.  2. History of supraventricular tachycardia (most probably AVNRT),      unable to ablate at electrophysiology study on March 25, 2007.  3. Pericardiocentesis on March 07, 2007 for pericardial effusion.      The patient presented with dyspnea.  4. History of prostate cancer.   PROCEDURES:  1. On March 19, 2007, a 2-D echocardiogram showed an ejection      fraction of 70-80% with pericardial effusion.  2. On March 20, 2007, pericardial window Dr. Kathlee Nations Trigt for      recurrent pericardial effusion - idiopathic.  3. On March 25, 2007, electrophysiology study aborted secondary to      recurrent persistent atrial fibrillation despite multiple attempts      at cardioversion.   BRIEF HISTORY:  [name] [name] is a 70 year old male.  He has been  hospitalized twice during the month of December, both times for  pericardial effusion.  He was originally at the office with Dr. Sherryl Manges on March 07, 2007 for a workup to have electrophysiology study  to ablate what was thought to be an A-V nodal reentry tachycardia.  The  patient complained at that time that he was getting progressively short  of breath.  A 2-D echocardiogram was expeditiously performed and it  showed a large pericardial effusion.  He was immediately transferred to  [facility] where he underwent pericardiocentesis.  He was  eventually discharged to return for a follow-up 2-D echocardiogram and,  I believe, this was scheduled for March 18, 2007.  At any rate, he  presented to [facility] in atrial fibrillation with shortness of  breath.  The 2-D echocardiogram showed recurrence of his pericardial  effusion and he was transferred to [facility].   HOSPITAL COURSE:  On transferring to [facility] on March 18, 2007 from [place], he was in atrial fibrillation with recurrence of  large pericardial effusion.  He was seen at [facility] by the  cardiovascular thoracic surgeons and scheduled for a pericardial window.  This was done on March 20, 2007 without complications.  The patient  did have medication therapy for rate control of his atrial fibrillation,  both metoprolol ER and Cardizem 60 mg q.i.d.  After the pericardial  window, he began having junctional rhythms and marked bradycardia.  Both  metoprolol and Cardizem were stopped as was Uroxatral, which was thought  to perhaps potentiate  both of those medications.  The patient's  Uroxatral was changed to Flomax and the patient was prepared for  electrophysiology study.  This was performed by Dr. Graciela Husbands on March 25, 2007.  The study could not go forward because the patient had  recurrent persistent atrial fibrillation despite cardioversion.  A  reentry mechanism was never identified.  The patient was started on IV  amiodarone.  In the overnight, the patient spontaneously converted to  sinus rhythm.  The patient goes home with oral amiodarone for four weeks  while a pericardial inflammation resolves.  He will see Dr. Graciela Husbands at the  end of that time for further medication adjustments and planning.   DISCHARGE MEDICATIONS:  The patient discharged on the following  medications:  1. Ultram 50 mg one or two tablets every 4 hours as needed for pain.  2. Amiodarone 200 mg tabs, two tablets in the morning and two tablets      in the evening from March 26, 2007 to April 08, 2007 and then      he is to start two tablets daily on April 09, 2007.  3. Flomax 0.4 mg daily at bedtime or he can to continue Uroxatral 10      mg at bedtime.  4. Benazepril 10 mg daily.  5. The patient was asked to stop metoprolol ER, stop Lotensin,      hydrochlorothiazide and stop Cardizem 60 mg q.i.d.   FOLLOWUP:  He follows up with Dr. Andee Lineman at [facility]  office on April 05, 2007 at 8:30.  He will present to [facility] for chest x-ray on April 05, 2007 at noon.  He sees Dr. Donata Clay on April 05, 2007 at 12:30 and he will see Dr. Graciela Husbands at [facility] in [place] on April 17, 2007 at 11:15.   LABORATORY STUDIES THIS ADMISSION:  His blood count on March 22, 2007  with hemoglobin 13.3, hematocrit 38.3, white cells 11.4 and platelets of  167.  Serum electrolytes on March 22, 2007 with sodium 136, potassium  3.8, chloride 100, carbonate 30, BUN is 13, creatinine 0.96, glucose of  127.  Protime on this  admission was 13.  INR was 1.  Alkaline  phosphatase on this admission was 71, SGOT 31, SGPT is 45.  Urinalysis  was negative.      Maple Mirza, PA     GM/MEDQ  D:  03/26/2007  T:  03/26/2007  Job:  [id number]   cc:   Learta Codding, MD,FACC  Duke Salvia, MD, [facility]  Donzetta Sprung

## 2010-08-09 NOTE — Consult Note (Signed)
[name] [name], [name] [name]                 ACCOUNT NO.:  [id number]   MEDICAL RECORD NO.:  [id number]          PATIENT TYPE:  INP   LOCATION:  2014                         FACILITY:  [facility]   PHYSICIAN:  Lennis P. Darrold Span, M.D.DATE OF BIRTH:  1940-12-19   DATE OF CONSULTATION:  06/19/2007  DATE OF DISCHARGE:                                 CONSULTATION   The patient is seen in consultation at the request of Dr. Donata Clay for  question of lymphoma or other hematologic disorder with recurrent  pericardial effusion.   The patient is a 70 year old gentleman who lives in [place] and has had a  history of chronic intermittent SVT for years.  He has been having some  progressive problems with this and was seen in 2008 by Dr. Sherryl Manges  for possible radiofrequency ablation.  Echocardiogram done as part of  that workup demonstrated a large pericardial effusion with tamponade.  The patient was hospitalized at [facility] from December 11 through  March 09, 2007 with a pericardiocentesis of 1300 mL, cytology being  negative for malignancy with lymphocytes and mesothelial cells noted.  Cultures were negative, ANA was negative, and CT of the chest done  March 07, 2007 at [facility] negative for adenopathy.  Lungs not remarkable  other than minimal compressive atelectasis and small bilateral pleural  effusions, tiny nonspecific area of the left lung and tiny areas in the  right and left lobes of the liver felt probably cysts.  He had  reaccumulation of pericardial effusion requiring pericardial window  March 20, 2007 by Dr. Donata Clay, 400 mL of fluid with cytology  showing only reactive mesothelial cells and lymphocytes with no  malignant cells.  Pericardial biopsy showed mild chronic inflammation  and fibrosis without malignancy.  Cultures including AFB were negative.  Course has been complicated by paroxysmal atrial tachycardia/atrial  fibrillation and a skin rash thought secondary to amiodarone which  has  improved.  The patient had been doing generally well at home since the  pericardial window, though he felt a little tired this past weekend.  On  the morning of admission, he developed tachycardia with rate of 150-160.  This did not respond to oral beta blocker at home, and the patient was  taken to the [facility] emergency room.  I believe that the SVT had  converted by the time that he arrived in the emergency room.  The chest  x-ray there showed increase in heart size.  The patient was seen by Dr.  Andee Lineman in the emergency department at [facility].  Bedside echocardiogram  showed large pericardial effusion circumferential with some  echocardiographic criteria for tamponade.  The patient was  hemodynamically stable.  He was transferred to [facility] where  a repeat echocardiogram has been done with that report pending and a  repeat chest x-ray is pending.  He has been seen by Dr. Donata Clay and is  scheduled for pericardial window on March 26.   Laboratories at [facility] today, sodium 141, potassium 4.5, chloride 108, CO2  28, glucose 94, BUN 7, creatinine 0.7, calcium 9.  White count 6.9,  hemoglobin 12.6, platelets 177.  INR 1.1, PTT 34.  Beta natriuretic 255  and troponin 0.07.   PAST HISTORY:  Intolerance to aspirin.  History of hypertension.  He had  prostate cancer treated with seed implants by Dr. Earlene Plater in 2007 with PSA  normal in December per Dr. Zenaida Niece Trigt's note.  He had temporal arteritis  on the right in 2004 treated in [place] by Dr. Garner Nash, history of  diverticulosis, history of paroxysmal atrial fibrillation/SVT.   FAMILY HISTORY:  Mother died of an MI in her 79s.  Father died with  prostate cancer, possibly other cancer.  Two brothers have prostate and  lung cancer, and one of those brothers also has colon cancer.   SOCIAL HISTORY:  He is married and lives with his wife in [place].  He has  not used tobacco in over 40 years.   PHYSICAL EXAMINATION:  No vitals are  presently in the computer.  The  patient is comfortable, fully alert, appropriate supine in bed on room  air.  No peripheral adenopathy.  Breath sounds clear bilaterally.  His abdomen is soft and nontender without hepatosplenomegaly.  LOWER EXTREMITIES:  Without cords or edema.   The patient also had CT chest, abdomen, and pelvis at [facility] prior to  transfer to [facility] today.  That report is of moderate to large  pericardial effusion.  No adenopathy.  Spleen negative.  Liver negative  except benign-appearing cyst and no bony abnormalities.  Last CBC with  differential in our system from December 24:  White count 6.8,  hemoglobin 13.3, platelets 182, segs 73, lymphs 16, mono 9, eosinophils  2, basophils 0.  Last CMET in our system December 26 included total bili  0.8, alk phos 71, GOT 31, GPT 45, total protein 5.9, albumin 2.8,  calcium 8.9. I do not find a LDH, and I have asked the lab to add  complete CMET and differential to the CBC and LDH to the blood already  drawn.   IMPRESSION/RECOMMENDATIONS:  1. Recurrent pericardial effusion post pericardial window:  Etiology      unclear.  Cytology of the fluid x2 and pathology of pericardial      biopsy in December 2008 were negative for malignancy.  He has no      adenopathy or other findings on the CT today suggesting lymphoma,      and most recent full blood work above does not suggest hematologic      problem.  The patient goes for repeat pericardial window, I would      suggest that surgeon give a history directly to the pathologist.      Cytology has been done and would generally be appropriate for the      pericardial fluid.   Will follow up the results and see the patient again if needed.  Thank  you for the consultation.      Lennis P. Darrold Span, M.D.  Electronically Signed     LPL/MEDQ  D:  06/19/2007  T:  06/20/2007  Job:  [id number]   cc:   Kerin Perna, M.D.  Learta Codding, MD,FACC  Donzetta Sprung

## 2010-08-09 NOTE — H&P (Signed)
[facility] ADMISSION   NAME:Fitchett, TREYSEN SUDBECK                  MRN:          [id number]  DATE:03/07/2007                            DOB:          03/21/1941    Please see the previously dictated note.  Mr. Maldonado was referred for  evaluation for supraventricular tachycardia.  Pre-procedural echo was  done demonstrating a large circumferential effusion with right atrial  and right ventricular collapse per Dr. Eden Emms.  Re-evaluation  demonstrated what I think is probably neck veins to the angle of the  jaw.  It was hard to tell because he was upright and I could not see for  sure.  His blood pressure was 180 with a pulse that started off at only  15.  Given the hemodynamic evidence of compromise based on the  echocardiographic interpretation, he is being referred to the hospital  for pericardiocentesis.     Duke Salvia, MD, [facility]  Electronically Signed    SCK/MedQ  DD: 03/07/2007  DT: 03/07/2007  Job #: [id number]

## 2010-08-09 NOTE — Consult Note (Signed)
[name] [name], [name] [name]                 ACCOUNT NO.:  [id number]   MEDICAL RECORD NO.:  [id number]          PATIENT TYPE:  INP   LOCATION:  3738                         FACILITY:  [facility]   PHYSICIAN:  Kerin Perna, M.D.  DATE OF BIRTH:  [birth year]   DATE OF CONSULTATION:  DATE OF DISCHARGE:                                 CONSULTATION   REASON FOR CONSULTATION:  Recurrent pericardial effusion from probable  idiopathic pericarditis.   CHIEF COMPLAINT:  Abnormal fluid around my heart.   PRESENT ILLNESS:  I was asked to evaluate this 70 year old white male  patient from [place] (Dr. Donzetta Sprung) who was admitted to [facility]  earlier this month (December 11) with a large pericardial effusion and  partial tamponade physiology.  He underwent pericardiocentesis by Dr.  Juanda Chance which removed 1200 mL of xanthochromic fluid which was positive  for multiple lymphocytes but otherwise negative for culture, malignancy  or sign of infection.  The patient had a past history of temporal  arteritis and his ANA level at the last admission was negative.  His  other antigens including CEA, PSA and alpha-fetoprotein are also  negative.  The patient was discharged home on ibuprofen and did well  until yesterday when he developed tachycardia with some chest tightness  and presyncope.  He is found to be PAT--atrial fibrillation and admitted  to [facility].  A Cardizem drip was started which controlled the  atrial fibrillation and his symptoms.  His cardiac enzymes were  negative.  A prior stress test was negative for ischemia with normal  LVEF of 55%.  A follow-up 2D echo was performed which showed recurrent  moderate to large pericardial effusion, so the patient was then  transferred to [facility] today.  This large effusion was confirmed  by chest wall echo performed here and read by Dr. Jens Som and a  subxiphoid pericardial window was recommended due to the recurrent  nature of the pericardial  effusion.   The patient denies any current symptoms of tamponade and his blood  pressures have been stable and his heart rate has been sinus bradycardia  this afternoon.   PAST MEDICAL HISTORY:  1. Hypertension.  2. History of paroxysmal atrial fibrillation--SVT.  3. History of adenocarcinoma of the prostate treated with radioactive      seed implantation by Dr. Earlene Plater.  4. History of temporal arteritis, resolved.  5. Fracture of the distal tibia/fibula in 2003.  6. Diverticulosis.   HOME MEDICATIONS:  Lotensin--ACT, Toprol XL, multivitamin, and Uroxatral  10 mg q.h.s.   ALLERGIES:  HE IS INTOLERANT TO ASPIRIN WITH GI SYMPTOMS.   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  He does  not smoke or use alcohol.   REVIEW OF SYSTEMS:  No fever, weight loss, night sweats.  No hematuria,  blood per rectum, productive cough or back pain.  No syncope, stroke or  seizure.  No diabetes, thyroid disease or renal insufficiency.   A CT scan of the chest and abdomen performed earlier this month showed  mild atherosclerotic disease of the  thoracic aorta without aneurysm and  clear lung fields and no significant adenopathy.   PHYSICAL EXAMINATION:  The patient is 6 feet tall and weighs 95 kg.  Blood pressure is 130/70, pulse 60 and sinus, temperature 98.3.  GENERAL APPEARANCE:  That of a pleasant middle-aged white male in no  distress.  HEENT:  Exam is normocephalic.  Dentition good.  NECK:  Supple without JVD, mass or bruit.  LYMPHATICS:  Reveal no palpable adenopathy.  LUNGS:  Breath sounds are clear and equal.  CARDIAC:  Exam is regular rhythm without S3 gallop or rub or murmur.  ABDOMINAL EXAM:  Soft, nontender.  EXTREMITIES:  Reveal no clubbing, cyanosis or edema.  Peripheral pulses  are 2+ in all extremities.  NEUROLOGIC:  Exam is intact.   IMPRESSION AND PLAN:  The patient has a moderate to large significant  recurrent pericardial effusion from probable idiopathic  pericarditis.  I  agree with the plan to proceed with subxiphoid pericardial window to  obtain tissue to help provide more information on the diagnosis and also  to help prevent recurrent episodes.  The procedure, incision,  anesthesia, and expected recovery were discussed with the patient and  family.  They also understand the associated risks and alternatives.  They agreed to proceed with the surgery planned for afternoon of  December 24.   Thank you for the consultation.      Kerin Perna, M.D.  Electronically Signed     PV/MEDQ  D:  03/19/2007  T:  03/20/2007  Job:  [id number]

## 2010-08-09 NOTE — Discharge Summary (Signed)
[name] [name], [name] [name]                 ACCOUNT NO.:  [id number]   MEDICAL RECORD NO.:  [id number]          PATIENT TYPE:  INP   LOCATION:  2022                         FAC[facility]   PHYSICIAN:  Rollene Rotunda, MD, FACCDATE OF BIRTH:  09-Oct-1940   DATE OF ADMISSION:  03/07/2007  DATE OF DISCHARGE:  03/09/2007                               DISCHARGE SUMMARY   CARDIOLOGIST:  Learta Codding, MD,FACC.   ELECTROPHYSIOLOGIST:  Duke Salvia, MD, [facility].   PRIMARY CARE PHYSICIAN:  Dr. Donzetta Sprung.   REASON FOR ADMISSION:  Pericardial tamponade.   DISCHARGE DIAGNOSES:  1. Pericardial tamponade, etiology unclear (question viral etiology).      a.     Status post pericardiocentesis, March 07, 2007 (1300 mL       of yellow fluid removed).  2. Symptomatic supraventricular tachycardia.  3. Hypertension.  4. Bradycardia.  5. History of small scar by Cardiolite but no definite ischemia.   ADMISSION HISTORY:  [name] [name] is a 70 year old male patient with a  history of recurrent SVT that is quite symptomatic.  He saw Dr. Andee Lineman  on February 08, 2007, in the [place] office.  He was referred for further  evaluation by electrophysiology.  He saw Dr. Graciela Husbands on March 07, 2007,  in the [place] office.  Apparently the patient was complaining of  some shortness of breath.  It was felt that the patient needed an  echocardiogram which was done that day.  This revealed a very large  circumferential pericardial effusion with RV and RA collapse and signs  of tamponade.  The patient was admitted to [facility] for  further evaluation and treatment.   HOSPITAL COURSE:  The patient was taken directly to the cardiac  catheterization lab where he underwent pericardiocentesis by Dr. Juanda Chance.  His right atrial pressure dropped from 16-10, and his pericardial  pressure dropped from 16 to zero with pericardiocentesis.  His pulmonary  artery saturation went from 67% to 79%.  Thirteen-hundred mL of  yellow  fluid was drained.  A chest CT was performed and this revealed tiny  bilateral pleural effusions with minimal compressive atelectasis in the  posterior lungs.  Single tiny nonspecific left upper lobe nodule, very  minimal pericardial thickening or fluid, minimal aneurysmal dilatation  posterior aspect of the aortic arch, low attenuation hepatic foci which  could represent tiny hepatic cysts.  Ultrasound characterization is  recommended by the radiologist.  Fluid studies were done.  The patient's  pericardial fluid revealed a glucose of 96, creatinine of 0.9, total  protein 6.1.  Amylase 39, LDH 164, and pH 7.71.  Cell count revealed  13,555 white blood cells.  Differential included 87% lymphocytes and 13%  monocytes.  There were normal and reactive mesothelial cells noted.  AFB  smear initially revealed no AFB seen and culture is currently pending.  On the date of discharge, the patient was interviewed and examined by  Dr. Antoine Poche who felt he was stable enough for discharge to home.   The patient will need a followup echocardiogram next week.  He will also  need close followup without Dr. Andee Lineman in the [place] office.  He will need  followup with Dr. Graciela Husbands for his SVT.  We will also arrange an outpatient  abdominal ultrasound as recommended by the radiologist.   LABORATORY/ACCESSORY DATA:  Chest CT as noted above.  White count 5100,  hemoglobin 13.1, hematocrit 38.1, platelet count 160,000.  Sed rate 20.  INR 1.1.  Sodium 140, potassium 3.6, BUN 10, creatinine 0.76, glucose  82, calcium 9.2.  TSH 2.57.  Alpha fetoprotein tumor marker 2.8 which  was within the normal range.  CEA 1.8 which is within the normal range.  PSA 0.17 which is within the normal range.  Fluid studies as noted  above.  Rheumatoid factor less than 20.  ANA negative.  Blood culture  pending.   DISCHARGE MEDICATIONS:  1. Metoprolol 25 mg daily p.r.n.  2. Uroxatral 10 mg q.h.s.  3. Benazepril HCTZ 10/12.5 mg  daily.  4. Multivitamin daily.  5. Ibuprofen 400 mg 3 times a day for 7 days.   DIET:  Low fat, low sodium diet.   WOUND CARE:  He needs to call for any redness, swelling, bleeding, or  fever.   ACTIVITY:  He is to increase activity slowly.   FOLLOWUP:  1. He will undergo an echocardiogram next week at [facility]      and the office will contact him with an appointment.  2.  Followup      is with Dr. Andee Lineman on December 30th at 1:30 p.m.  2. Our office will arrange followup with Dr. Graciela Husbands  3. We will also arrange outpatient abdominal ultrasound secondary to      findings noted on chest CT.   Total physician and P.A. time greater than 30 minutes for this  discharge.      Tereso Newcomer, PA-C      Rollene Rotunda, MD, [facility]  Electronically Signed    SW/MEDQ  D:  03/09/2007  T:  03/10/2007  Job:  [id number]   cc:   Donzetta Sprung

## 2010-08-09 NOTE — Op Note (Signed)
NAMECOALTON, ARCH                 ACCOUNT NO.:  [id number]   MEDICAL RECORD NO.:  [id number]          PATIENT TYPE:  INP   LOCATION:  2550                         FACILITY:  [facility]   PHYSICIAN:  Kerin Perna, M.D.  DATE OF BIRTH:  1940-04-01   DATE OF PROCEDURE:  03/20/2007  DATE OF DISCHARGE:                               OPERATIVE REPORT   OPERATION:  Subxiphoid pericardial window.   PREOPERATIVE DIAGNOSIS:  Recurrent moderate to large pericardial  effusion   POSTOPERATIVE DIAGNOSIS:  Recurrent moderate to large pericardial  effusion.   SURGEON:  Kerin Perna, M.D.   ASSISTANT:  Coral Ceo, P.A.-C.   ANESTHESIA:  General.   INDICATIONS:  The patient is a 70 year old male who had been  hospitalized on December 11 for symptoms of tamponade and a large  pericardial effusion which was drained by pericardial centesis in the  catheterization lab; 1200 mL of fluid was removed with cytology and  cultures negative.  The patient was reexamined with a follow-up  echocardiogram which showed a significant recurrent pericardial  effusion, and he was felt to be a candidate for pericardial window.  I  discussed the procedure subxiphoid pericardial window with the patient  and his family including indications, alternatives, benefits and  recovery.  I reviewed the risks of bleeding, infection and recurrent  effusion.  After our discussion, he demonstrated his understanding and  agreed to proceed with operation as planned.   PROCEDURE:  The patient was anesthetized for general anesthesia, and the  chest and abdomen were prepped and draped.  A small incision was made  and centered over the xiphoid.  The fascia was opened, and the tip of  the xiphoid was removed with a rongeur.  The chest elevating retractor  (Rule) retractor was positioned to elevate the xiphoid and the sternum.  The substernal tissue plane was dissected to expose the pericardium.  An  incision was made in the  pericardium with a 15 blade scalpel, and  immediately fluid under pressure was drained.  This was clear.  This was  collected and sent for the appropriate studies and cytology.  Next, a  large generous pericardial window of 3 cm in diameter was excised from  the anterior pericardium and submitted for culture and pathology.  The  pericardium was just minimally thickened.  The surface of the heart was  inspected, and there were no nodules or fibrinous exudate.  The  pericardium had no adhesions, and there was no bloody fluid.  The heart  appeared to be contracting normally from what I could see through the  opening.  Fluid of 400 mL was removed.  Next, a soft Bard Silastic drain  was placed in the pericardial space and brought out through a separate  incision and secured to the skin.  Next, after hemostasis was assured,  the fascia was closed with interrupted #1 Vicryl.  Subcutaneous was  closed with a running Vicryl and skin was closed with a subcuticular.  Sterile dressing was applied.  The patient was reversed from anesthesia  and returned to recovery room.  The patient's echocardiogram was documented by a TEE performed by the  anesthesiologist which showed a moderate to large pericardial effusion  located anterolaterally.   At the end of the procedure, all sponge, needle and instrument counts  were reported as being correct, and blood loss was minimal.      Kerin Perna, M.D.  Electronically Signed     PV/MEDQ  D:  03/20/2007  T:  03/20/2007  Job:  [id number]   cc:   [facility] Office  Madolyn Frieze. Jens Som, MD, [facility]

## 2010-08-09 NOTE — Letter (Signed)
April 17, 2007    Learta Codding, MD,FACC  [address]  [place], [place] [address]   RE:  AADITH, RAUDENBUSH  MRN:  [id number]  /  DOB:  08/26/1940   Dear Michelle Piper:   Mr. Brenneman is seen following hospitalization for effusion and then  subsequently, a window because of recurrent effusion.  I had seen him  initially for supraventricular tachycardia.  He has had no  supraventricular tachycardia since.  He is not on any beta-blocker.  He  apparently was put on Pacerone after his hospitalization because of  atrial fibrillation.  He developed some type of reaction and you stopped  it.   His current medications Guy, include Benazepril/hydrochlorothiazide  10/12.5 and Uroxatral.   On examination today his blood pressure is 120/80.  Pulse is 66.  His  lungs were clear.  Heart sounds were regular.  The extremities were  without edema.  The electrocardiogram was normal.   IMPRESSION:  1. Supraventricular tachycardia.  2. Paroxysmal atrial fibrillation.  3. Pericardial effusion status post drainage and then a window.  4. Normal left ventricular function.   Mr. Gomes would like to put off a procedure at this point until he has  recurrences following the effusion, and I think that this is reasonable  as the effusion may have been irritating and triggering something.   We will arrange to have him see you in two months in followup, and I  will see him again at your request.    Sincerely,      Duke Salvia, MD, [facility]  Electronically Signed    SCK/MedQ  DD: 04/17/2007  DT: 04/17/2007  Job #: [id number]

## 2010-08-09 NOTE — Discharge Summary (Signed)
[name] [name], [name] [name] NO.:  [id number]   MEDICAL RECORD NO.:  [id number]          PATIENT TYPE:  INP   LOCATION:  3305                         FACILITY:  [facility]   PHYSICIAN:  Kerin Perna, M.D.  DATE OF BIRTH:  1941-02-01   DATE OF ADMISSION:  03/18/2007  DATE OF DISCHARGE:  03/26/2007                               DISCHARGE SUMMARY   He has aspirin intolerance and was admitted December 22 and discharging  December 30.   FINAL DIAGNOSES:  1. Recurrent pericardial effusion, pericardial effusion discharging      day #6, status post pericardial window.  2. Echocardiogram March 19, 2007, ejection fraction 78% to 80%.  3. Pericardial window pathology, mild chronic inflammation and      fibrosis, no malignancy.  4. Bradycardia with junctional escape on Cardizem and metoprolol, now      discontinued.  5. Paroxysmal atrial fibrillation.  The patient is discharging day one      status post electrophysiology study, originally for AV nodal re-      entry tachycardia, but aborted secondary to recurrent atrial      fibrillation, which relapsed despite multiple cardioversions.   SECONDARY DIAGNOSES:  1. Hypertension.  2. 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  3. History of prostate cancer.  4. Pericardial effusion December 11 with pericardiocentesis.  The      patient presented with progressive dyspnea.   BRIEF HISTORY:  [name] [name] is a 70 year old male who originally presented  to [name] [name] office December 11 for consideration of his  electrophysiology study, radiofrequency catheter ablation of a SVT.  The  patient related to [name] [name] that he had been having some progressive  dyspnea.  A 2-D echocardiogram was [facility], and it showed that there was a large pericardial effusion.  He was  immediately transferred to [facility], where he underwent  pericardiocentesis by [name] [name],  eventually discharging home.  He  had follow-up 2-D echocardiogram scheduled for December 22, but actually  presented with irregular heart rate which was rapid and was found to be  in atrial fibrillation.  The 2-D echocardiogram did show that his  pericardial effusion had relapsed.  He was transferred then to [facility] from [facility] for a pericardial window.   HOSPITAL COURSE:  The patient admitted to [facility] December  22.  He was seen there in consultation by cardiac thoracic surgeons and  scheduled for pericardial window.  This was done without complications.  The patient was discharging 6 days later.  The pathology as reported  above was negative for malignancy.  The patient then underwent  electrophysiology study on December 29.  The re-entry tachycardia could  not be identified, due to recurrent atrial fibrillation.  The patient  was started on IV amiodarone and converted to sinus rhythm overnight.  Previously, his rate control had been in the hands of metoprolol and  Cardizem.  He had a junctional bradycardia on these medications, and  they were both discontinued.  Patient discharging on oral amiodarone.   DISCHARGE MEDICATIONS:  He goes home on the following medication:  1. Ultram 50 mg 1 to 2 tablets every 4 hours as needed for pain.  2. Amiodarone 200 mg tablets, 2 tablets in the morning and 2 tablets      in the evening from December 30 to April 08, 2007, and then  to      start 2 tablets daily, April 09, 2007.  3. Uroxatral 10 mg daily at bedtime.  4. Benazepril 10 mg daily.  He is to stop metoprolol, stop Lotensin, hydrochlorothiazide.   FOLLOWUP:  1. With [name] [name], [facility] office, January 9 at 8:30.  2. He presents to [facility] for chest x-ray January 9 at      noon.  He will see [name] [name] January 9 at 12:30.  He presents      to [name] [name] office, [facility] [place], January 21,       11:15.   The thought is that, as the patients pericardial inflammation resolves,  that further plans with be undertaken for [name] [name].      Kerin Perna, M.D.  Electronically Signed     Kerin Perna, M.D.  Electronically Signed    PV/MEDQ  D:  03/26/2007  T:  03/26/2007  Job:  [id number]   cc:   Kerin Perna, M.D.

## 2010-08-09 NOTE — Assessment & Plan Note (Signed)
[facility]                          [facility]ICE NOTE   [name] [name], [name] [name]                  MRN:          [id number]  DATE:02/08/2007                            DOB:          1940/04/25    HISTORY OF PRESENT ILLNESS:  The patient is a 70 year old male with  history of recurrent supraventricular tachycardia.  Previously, this was  documented in December of 2007 to be an AV nodal re-entry tachycardia.  Rhythm strips again during this office visit were reviewed from the  admission at [facility].  I last saw the patient in March of 2008.  He had  remained clinically stable with no recurrence of palpitations.  He also  underwent at that time stress testing with Cardiolite stress study which  demonstrated an ejection fraction of 59%.  There was no definite  ischemia but there was a small high lateral wall defect and a small area  of scar could not be ruled out.   Since his last office visit the patient now has had several episodes of  recurrent palpitations associated with symptoms of dizziness and near  syncope.  He reports that in July, September and October, each time he  had an episode lasting well over an hour.  The episodes in September  lasted approximately one hour and resolved with an extra dose of  metoprolol.  However, in October he had a very concerning episode for  which he called Dr. Reuel Boom and in particular the wife gives me a rather  vivid story of what happened on that day.  She states that her husband  suddenly became sweaty and clammy around 7:00 P.M.  His heart rate was  noted to be 140 and blood pressure dropped to 71/59.  She recalls these  events in quite some detail.  He also reported some left sided arm pain  associated with this.  The patient took a metoprolol 25 mg and it did  not resolve.  He then took another one approximately 15 to 20 minutes  later but got very nauseated.  He started vomiting and felt he was  going  to pass out.  Fortunately, he suddenly reverted back to normal sinus  rhythm and his symptoms rapidly resolved.  The whole episode lasted from  7:00 P.M. to 10:40 P.M.  Since that time he has had no further problems.  As a matter of fact, the patient is actually very active and he walks a  3 [place] without any symptoms  of substernal chest pain or shortness of breath.   MEDICATIONS:  1. Uroxatral 10 mg p.o. q.h.s.  2. Metoprolol ER 75 mg p.o. daily.  3. Benazepril/hydrochlorothiazide __________ /12.5 mg p.o. daily.   PHYSICAL EXAMINATION:  VITAL SIGNS:  Blood pressure 156/75, heart rate  54.  Weight is 219 pounds.  NECK:  Normal carotid upstroke, no carotid bruits.  LUNGS:  Clear breath sounds bilaterally.  HEART:  Regular rate and rhythm with normal S1 and S2.  No murmurs, rubs  or gallops.  ABDOMEN:  Soft, nontender. No rebound tenderness or guarding.  Good  bowel sounds.  EXTREMITIES:  No cyanosis, clubbing or edema.  NEUROLOGICAL:  Patient is alert, oriented and grossly nonfocal.   PROBLEM LIST:  1. Supraventricular tachycardia with recurrence in symptoms.  2. Left ventricular hypertrophy.  3. Small scar by Cardiolite but no definite ischemia.  4. History of hypertension.   PLAN:  1. The patient has now frequent recurrent episodes of what appears to      be similar to his presentation in December of 2007.  I pulled up      from the computer the rhythm strips from that admission to make      sure that he indeed had a narrow QRS tachycardia.  2. I have provided the rhythm strip and baseline electrocardiogram for      Dr. Graciela Husbands and Dr. Ladona Ridgel to review and I will send the patient to      [facility] for a second opinion for consideration of radiofrequency      catheter ablation.  3. Alternative treatment would be medical therapy with antiarrhythmic      drugs but I do not consider this an optimal option.  The patient      may  also need a formal EP study prior to consideration of      radiofrequency catheter ablation.     Learta Codding, MD,FACC  Electronically Signed    GED/MedQ  DD: 02/08/2007  DT: 02/09/2007  Job #: [id number]   cc:   Donzetta Sprung

## 2010-08-12 NOTE — Op Note (Signed)
[name] [name], [name]                 ACCOUNT NO.:  [id number]   MEDICAL RECORD NO.:  [id number]           PATIENT TYPE:   LOCATION:                                 FACILITY:   PHYSICIAN:  Ronald L. Earlene Plater, M.D.       DATE OF BIRTH:   DATE OF PROCEDURE:  11/01/2005  DATE OF DISCHARGE:                                 OPERATIVE REPORT   DIAGNOSIS:  Adenocarcinoma of the prostate.   OPERATIVE PROCEDURE:  1. Transperineal robotic arm Nucletron.  2. Seed implantation of iodine 125 seeds.  3. Flexible cystourethroscopy.   SURGEON:  Lucrezia Starch. Earlene Plater, M.D.   ANESTHESIA:  General endotracheal anesthesia.   ESTIMATED BLOOD LOSS:  10 mL.   TUBES:  A 16-French Foley catheter.   COMPLICATIONS:  None.   NEEDLES:  A total of 27 needles were utilized to implant 66 seeds at 28.3800  millicuries total p.r.n. activity.   INDICATIONS FOR PROCEDURE:  [name] [name] is a very nice 70 year old white male  who had an elevated PSA; and a family history of prostate cancer.  His PSA  was up to 7.1 and biopsy of the prostate revealed a Gleason score 6 which is  3 + 3, adenocarcinoma on the right side of the prostate in 10% of the  biopsies.  He has considered all options carefully.  His prostate was  somewhat enlarged; and he underwent downsizing; and after understanding the  risks, benefits, and alternatives, he has elected to proceed with seed  implantation.  He has been properly simulated, and properly informed.   PROCEDURE IN DETAIL:  The patient was placed in the supine position after  proper general endotracheal anesthesia; and was placed in the dorsal  lithotomy position and prepped and draped with Betadine in a sterile  fashion.  A 16-French Foley catheter was inserted into the bladder and the  bladder and the bladder was drained.  He was placed in the dorsal lithotomy  position, as noted.  The transrectal ultrasound probe with the BNK device  was placed and localized to coordinates.  Images were  obtained both axillary  and sagittally for 3-dimentional ultrasound and proper intraoperative  dosimetry was performed and the preplan was augmented so that we were very  comfortable with the dose volume histograms for the prostate, the rectum,  and the urethra.  Two holding needles were placed in the unused coordinates.  Utilizing both the physical and electronic grid and the automatic implanter,  initial base needle was placed and the seeds were implanted.   The seeds were serially implanted in the proper coordinates and total  implantation was then performed. A total of 27 needles were utilized to  implant 66 seeds with a total apparent activity of 28.3800 millicuries.  We  were very comfortable with the seed implantation.  All devices, needles,  holding needles, etcetera were removed and the wound was dressed sterilely.  The patient was placed in the supine position and Foley catheter was  removed, scanned for seeds; and there were none within it.  A static image  was  obtained, fluoroscopically, and we were very comfortable with the  location of the seeds.  A flexible cystourethroscopy was performed and there  was moderate trilobar hypertrophy noted and no seeds or spacers were noted  in the urethra or in the bladder. The bladder had grade 1 trabeculation.  An  efflux of clear urine was noted bilaterally.  A flexible  cystourethroscope  was visually removed.  A new 16-French catheter was placed.  The bladder was  drained.  The patient was taken to the recovery room stable.      Ronald L. Earlene Plater, M.D.  Electronically Signed     RLD/MEDQ  D:  11/01/2005  T:  11/01/2005  Job:  [id number]

## 2010-08-12 NOTE — Assessment & Plan Note (Signed)
[facility]                          [facility]ICE NOTE   [name], [name] [name] [name]                  MRN:          [id number]  DATE:05/31/2006                            DOB:          [birth year]    HISTORY OF PRESENT ILLNESS:  The patient is a 70 year old male with a  history of recurrent supraventricular tachycardia. This was felt to be  an AV nodal reeentrant tachycardia.  The patient, however, he has been  clinically stable. He has had no recurrence of palpitations. In the  interim since his last visit, he has undergone a Cardiolite stress  study. This demonstrated an ejection fraction of 59%. The perfusion data  demonstrated no definite ischemia. There was a small high lateral wall  defect, which could represent a small area of scar.   The patient, however, is asymptomatic. He reports no substernal chest  pain, shortness of breath, orthopnea, or PND.   MEDICATIONS:  1. UroXatral 10 mg nightly.  2. Metoprolol ER 50 mg in the morning, 25 mg in the evening.  3. Bisoprolol hydrochlorothiazide 10/12.5 mg daily.   PHYSICAL EXAMINATION:  VITAL SIGNS: Blood pressure 135/75 on repeat,  initially it was 157/76, heart rate 62, weight 123 pounds.  GENERAL: Well nourished white male in no apparent distress.  HEENT: Pupils equal, round, and reactive to light, conjunctiva clear.  NECK: Supple, no carotid upstrokes or bruits.  LUNGS: Clear breath sounds bilaterally.  HEART: Regular rate and rhythm, normal S1, S2.  ABDOMEN: Soft.  EXTREMITIES: No edema.   PROBLEM LIST:  1. Supraventricular tachycardia.      a.     A/V nodal range of tachycardia.      b.     Short R/V tachycardia.  2. Cardiac murmur with LVH, but no outflow gradient.  3. Recent non-specific troponin elevation.      a.     Cardiolite possibly consistent with small scar in the high       lateral wall, but no ischemia.  4. Low risk.  5. History of hypertension.   PLAN:  1. The  patient may well have underlying coronary artery disease based      on the Cardiolite study, however, he has no unstable symptoms.  2. Saw some evidence of ischemia on the scan, will continue to treat      him medically. The      patient will need to take an aspirin a day in addition to his      Metoprolol and Bisoprolol hydrochlorothiazide. He can be clinically      followed and we will see him back in one year.     Learta Codding, MD,FACC  Electronically Signed    GED/MedQ  DD: 06/03/2006  DT: 06/04/2006  Job #: [id number]   cc:   Donzetta Sprung

## 2010-08-12 NOTE — Assessment & Plan Note (Signed)
[facility]                          [facility]ICE NOTE   [name] [name], [name] [name]                          MRN:          [id number]  DATE:04/10/2006                            DOB:          [birth year]    REFERRING PHYSICIAN:  Dr. Reuel Boom.   HISTORY OF PRESENT ILLNESS:  The patient is a 70 year old male with a  history of recurrent supraventricular tachycardia, felt to be an AV  nodal reentry tachycardia.  The patient also has a known cardiac murmur.  He has significant LVH echocardiography.  The patient was recently  admitted for supraventricular tachycardia.  Lopressor was increased.  The patient states he has been doing well.  He has had a single  breakthrough episode of palpitations that he was able to abort with  Valsalva maneuver.  He has done otherwise well.  He denies any  substernal chest pain, shortness of breath, orthopnea, or PND.  He has  no recurrent palpitations, short of the ones mentioned above, and no  syncope.   MEDICATIONS:  1. UroXatral 10 mg p.o. nightly.  2. Metoprolol ER, 75 mg p.o. daily.  3. Benazepril hydrochlorothiazide 10/12.5 mg p.o. daily.  4. Metoprolol short-acting p.r.n. breakthrough tachycardia.   PHYSICAL EXAMINATION:  VITAL SIGNS:  Blood pressure 144/86, heart rate  63, weight 227 pounds.  NECK:  Normal carotid upstroke.  No carotid bruits.  LUNGS:  Clear breath sounds bilaterally.  HEART:  Regular rate and rhythm.  Normal S1, S2.  No murmurs, rubs, or  gallops.  ABDOMEN:  Soft.  EXTREMITIES:  No cyanosis, clubbing, or edema.   PROBLEM LIST:  1. Supraventricular tachycardia.      a.     Apparent AV nodal reentry tachycardia.      b.     Short RP tachycardia.  2. Cardiac murmur with significant left ventricular hypertrophy by      echo but no significant mitral regurgitation.  3. Recent nonspecific troponin elevation.  4. History of hypertension.   PLAN:  1. The patient is stable after a single  breakthrough episode of      palpitations.  I have again offered him the possibility of radio      frequency ablation, but the patient wants to defer this and      continue medical therapy first.  2. The patient will see [name] back in 3 months and we will make final      decisions regarding possible need for ablation if he has recurrence      on medications.  3. The patient with mildly elevated cardiac troponin status post      hospitalization and we will plan to get a Cardiolite stress study.     Learta Codding, MD,FACC  Electronically Signed    GED/MedQ  DD: 04/10/2006  DT: 04/10/2006  Job #: [id number]   cc:   Donzetta Sprung

## 2010-09-08 ENCOUNTER — Encounter: Payer: Self-pay | Admitting: Cardiology

## 2010-10-14 ENCOUNTER — Other Ambulatory Visit: Payer: Self-pay | Admitting: Cardiology

## 2010-10-17 ENCOUNTER — Ambulatory Visit (INDEPENDENT_AMBULATORY_CARE_PROVIDER_SITE_OTHER): Payer: Medicare Other | Admitting: Cardiology

## 2010-10-17 ENCOUNTER — Encounter: Payer: Self-pay | Admitting: Cardiology

## 2010-10-17 VITALS — BP 132/68 | HR 59 | Resp 18 | Ht 72.0 in | Wt 231.8 lb

## 2010-10-17 DIAGNOSIS — I498 Other specified cardiac arrhythmias: Secondary | ICD-10-CM

## 2010-10-17 DIAGNOSIS — I10 Essential (primary) hypertension: Secondary | ICD-10-CM

## 2010-10-17 DIAGNOSIS — I471 Supraventricular tachycardia: Secondary | ICD-10-CM | POA: Insufficient documentation

## 2010-10-17 DIAGNOSIS — R002 Palpitations: Secondary | ICD-10-CM

## 2010-10-17 DIAGNOSIS — I319 Disease of pericardium, unspecified: Secondary | ICD-10-CM

## 2010-10-17 NOTE — Assessment & Plan Note (Signed)
Blood pressure well controlled. Continue current medical therapy 

## 2010-10-17 NOTE — Assessment & Plan Note (Signed)
No significant recurrent pericardial effusion by bedside echocardiogram. Also preserved LV function

## 2010-10-17 NOTE — Assessment & Plan Note (Signed)
No recurrence. The patient remains on a calcium channel blocker. He has been intolerant to amiodarone in the past.

## 2010-10-17 NOTE — Progress Notes (Signed)
HPI The patient is a 70 year old male with a history of idiopathic pericardial effusion. He also has a history of supraventricular tachycardia without recurrence. A Holter monitor was performed in the recent past which showed no significant arrhythmias. The patient also reports no recurrent palpitations. The patient reports no orthopnea PND or syncope. I performed a limited bedside echocardiogram to reassess his pericardial effusion, and there was only a very tiny posterior pericardial effusion.  Allergies  Allergen Reactions  . Amiodarone Hcl     REACTION: rash  . Aspirin     REACTION: stomach upset    Current Outpatient Prescriptions on File Prior to Visit  Medication Sig Dispense Refill  . alfuzosin (UROXATRAL) 10 MG 24 hr tablet Take 10 mg by mouth at bedtime.        Marland Kitchen aspirin 81 MG EC tablet Take 81 mg by mouth daily.        . benazepril (LOTENSIN) 20 MG tablet Take 20 mg by mouth daily.        . chlorthalidone (HYGROTON) 25 MG tablet Take 25 mg by mouth daily.        . metoprolol (TOPROL-XL) 50 MG 24 hr tablet TAKE ONE TABLET BY MOUTH EVERY DAY  30 tablet  0  . Multiple Vitamin (MULTIVITAMIN) tablet Take 1 tablet by mouth daily.        . Omega-3 Fatty Acids (FISH OIL TRIPLE STRENGTH) 1360 MG CAPS Take 1 capsule by mouth daily. 1400 mg capsule       . verapamil (CALAN-SR) 180 MG CR tablet TAKE ONE TABLET BY MOUTH EVERY DAY  30 tablet  6    Past Medical History  Diagnosis Date  . Pericardial effusion     Hx of idiopathic, recurrent pericardial effusion. s/p pericardial window. moderate sized effusion by echocardiogram 6/09  . Supraventricular tachycardia     Hx of it. quiescent  . LVF (left ventricular failure)     normal  . Allergy history, drug     amiodarone - secondary to rash. aspirin- secondary to hives    No past surgical history on file.  No family history on file.  History   Social History  . Marital Status: Married    Spouse Name: N/A    Number of Children:  N/A  . Years of Education: N/A   Occupational History  . Not on file.   Social History Main Topics  . Smoking status: Never Smoker   . Smokeless tobacco: Not on file  . Alcohol Use: Not on file  . Drug Use: Not on file  . Sexually Active: Not on file   Other Topics Concern  . Not on file   Social History Narrative  . No narrative on file   [id number] positives as outlined above. The remainder of the 18  point review of systems is negative   PHYSICAL EXAM BP 132/68  Pulse 59  Resp 18  Ht 6' (1.829 m)  Wt 231 lb 12.8 oz (105.144 kg)  BMI 31.44 kg/m2  SpO2 96%  General: Well-developed, well-nourished in no distress Head: Normocephalic and atraumatic Eyes:PERRLA/EOMI intact, conjunctiva and lids normal Ears: No deformity or lesions Mouth:normal dentition, normal posterior pharynx Neck: Supple, no JVD.  No masses, thyromegaly or abnormal cervical nodes Lungs: Normal breath sounds bilaterally without wheezing.  Normal percussion Cardiac: regular rate and rhythm with normal S1 and S2, no S3 or S4.  PMI is normal.  No pathological murmurs Abdomen: Normal bowel sounds, abdomen is soft and nontender  without masses, organomegaly or hernias noted.  No hepatosplenomegaly MSK: Back normal, normal gait muscle strength and tone normal Vascular: Pulse is normal in all 4 extremities Extremities: No peripheral pitting edema Neurologic: Alert and oriented x 3 Skin: Intact without lesions or rashes Lymphatics: No significant adenopathy Psychologic: Normal affect  ECG: Normal sinus rhythm no acute ischemic changes.  Limited bedside echocardiogram: Normal LV size and contraction. No wall motion abnormalities. Apparently normal aortic and mitral valve. Small posterior pericardial effusion  ASSESSMENT AND PLAN

## 2010-10-17 NOTE — Patient Instructions (Signed)
Continue all current medications. Your physician wants you to follow up in:  1 year.  You will receive a reminder letter in the mail one-two months in advance.  If you don't receive a letter, please call our office to schedule the follow up appointment   

## 2010-11-21 ENCOUNTER — Other Ambulatory Visit: Payer: Self-pay | Admitting: Cardiology

## 2010-12-19 LAB — DIFFERENTIAL
Basophils Absolute: 0
Eosinophils Absolute: 0.1
Eosinophils Relative: 1
Lymphocytes Relative: 10 — ABNORMAL LOW
Lymphs Abs: 0.8
Monocytes Absolute: 0.7

## 2010-12-19 LAB — BASIC METABOLIC PANEL
BUN: 7
Calcium: 9
GFR calc non Af Amer: >60
Glucose, Bld: 94

## 2010-12-19 LAB — COMPREHENSIVE METABOLIC PANEL
ALT: 13
ALT: 18
AST: 24
Albumin: 3.3 — ABNORMAL LOW
Alkaline Phosphatase: 72
BUN: 7
CO2: 25
CO2: 28
Chloride: 105
Creatinine, Ser: 0.93
GFR calc Af Amer: >60
GFR calc non Af Amer: >60
GFR calc non Af Amer: >60
Glucose, Bld: 102 — ABNORMAL HIGH
Potassium: 3.3 — ABNORMAL LOW
Potassium: 3.6
Sodium: 137
Sodium: 140
Total Bilirubin: 0.7
Total Bilirubin: 0.8

## 2010-12-19 LAB — CROSSMATCH
ABO/RH(D): O POS
Antibody Screen: NEGATIVE

## 2010-12-19 LAB — CBC
HCT: 36.4 — ABNORMAL LOW
HCT: 36.5 — ABNORMAL LOW
Hemoglobin: 12.6 — ABNORMAL LOW
Hemoglobin: 12.6 — ABNORMAL LOW
Platelets: 174
RBC: 4.03 — ABNORMAL LOW
RBC: 4.04 — ABNORMAL LOW
RBC: 4.17 — ABNORMAL LOW
RDW: 14.3
RDW: 14.6
WBC: 7.7

## 2010-12-19 LAB — POCT I-STAT 3, VENOUS BLOOD GAS (G3P V)
Acid-base deficit: 2
Bicarbonate: 23.4
Operator id: 166961
pH, Ven: 7.348 — ABNORMAL HIGH

## 2010-12-19 LAB — LACTATE DEHYDROGENASE: LDH: 169

## 2010-12-19 LAB — SEDIMENTATION RATE: Sed Rate: 24 — ABNORMAL HIGH

## 2010-12-19 LAB — CARDIAC PANEL(CRET KIN+CKTOT+MB+TROPI)
CK, MB: 2.5
CK, MB: 2.7
Relative Index: 1.6
Total CK: 167
Troponin I: 0.04

## 2010-12-19 LAB — B-NATRIURETIC PEPTIDE (CONVERTED LAB): Pro B Natriuretic peptide (BNP): 255 — ABNORMAL HIGH

## 2010-12-19 LAB — APTT: aPTT: 34

## 2010-12-19 LAB — PROTIME-INR: Prothrombin Time: 14

## 2010-12-30 LAB — CBC
HCT: 38.3 — ABNORMAL LOW
HCT: 41.9
Hemoglobin: 13.3
Hemoglobin: 14.1
MCHC: 33.7
MCHC: 33.9
MCHC: 34.7
MCV: 86.7
MCV: 87.5
MCV: 87.7
Platelets: 167
Platelets: 182
Platelets: 183
RBC: 4.42
RBC: 4.46
RBC: 4.79
RDW: 15.3
RDW: 15.4
RDW: 15.8 — ABNORMAL HIGH
WBC: 11.4 — ABNORMAL HIGH
WBC: 12.1 — ABNORMAL HIGH

## 2010-12-30 LAB — BASIC METABOLIC PANEL
BUN: 10
BUN: 13
CO2: 26
CO2: 28
Calcium: 8.4
Calcium: 9
Chloride: 104
Chloride: 106
Creatinine, Ser: 0.76
Creatinine, Ser: 0.9
GFR calc Af Amer: >60
GFR calc non Af Amer: >60
Glucose, Bld: 105 — ABNORMAL HIGH
Glucose, Bld: 183 — ABNORMAL HIGH
Potassium: 3.5
Sodium: 136

## 2010-12-30 LAB — BLOOD GAS, ARTERIAL
Acid-Base Excess: 0.3
Bicarbonate: 24.6 — ABNORMAL HIGH
O2 Content: 2
O2 Saturation: 2
Patient temperature: 97.1
TCO2: 25.8
pCO2 arterial: 39.2
pH, Arterial: 7.409
pO2, Arterial: 101 — ABNORMAL HIGH

## 2010-12-30 LAB — DIFFERENTIAL
Basophils Absolute: 0
Basophils Relative: 0
Eosinophils Absolute: 0.1 — ABNORMAL LOW
Monocytes Relative: 9
Neutrophils Relative %: 73

## 2010-12-30 LAB — TISSUE CULTURE
Culture: NO GROWTH
Gram Stain: NONE SEEN

## 2010-12-30 LAB — COMPREHENSIVE METABOLIC PANEL
ALT: 45
AST: 31
Albumin: 2.8 — ABNORMAL LOW
Alkaline Phosphatase: 71
BUN: 13
CO2: 30
Calcium: 8.9
Chloride: 100
Creatinine, Ser: 0.96
GFR calc Af Amer: >60
GFR calc non Af Amer: >60
Glucose, Bld: 127 — ABNORMAL HIGH
Potassium: 3.8
Sodium: 136
Total Bilirubin: 0.8
Total Protein: 5.9 — ABNORMAL LOW

## 2010-12-30 LAB — URINALYSIS, ROUTINE W REFLEX MICROSCOPIC
Bilirubin Urine: NEGATIVE
Glucose, UA: NEGATIVE
Hgb urine dipstick: NEGATIVE
Ketones, ur: NEGATIVE
Nitrite: NEGATIVE
Protein, ur: NEGATIVE
Specific Gravity, Urine: 1.016
Urobilinogen, UA: 1
pH: 7.5

## 2010-12-30 LAB — ANAEROBIC CULTURE

## 2010-12-30 LAB — BODY FLUID CULTURE: Gram Stain: NONE SEEN

## 2010-12-30 LAB — TYPE AND SCREEN

## 2010-12-30 LAB — PROTIME-INR: INR: 1

## 2011-01-02 LAB — BASIC METABOLIC PANEL
CO2: 30
Calcium: 9.2
Chloride: 102
Creatinine, Ser: 0.79
GFR calc Af Amer: >60
GFR calc non Af Amer: >60
Glucose, Bld: 82
Potassium: 3.6
Potassium: 3.6
Sodium: 139
Sodium: 140

## 2011-01-02 LAB — AMYLASE, BODY FLUID: Amylase, Fluid: 39

## 2011-01-02 LAB — POCT I-STAT 3, VENOUS BLOOD GAS (G3P V)
Acid-Base Excess: 4 — ABNORMAL HIGH
O2 Saturation: 67
O2 Saturation: 79
Operator id: 284701
TCO2: 27
pCO2, Ven: 49.3
pH, Ven: 7.326 — ABNORMAL HIGH

## 2011-01-02 LAB — CBC
HCT: 36.8 — ABNORMAL LOW
HCT: 38.1 — ABNORMAL LOW
Hemoglobin: 12.6 — ABNORMAL LOW
Hemoglobin: 13.1
MCHC: 34.4
MCV: 86
Platelets: 160
RBC: 4.28
RDW: 16.4 — ABNORMAL HIGH
WBC: 5.1
WBC: 5.1

## 2011-01-02 LAB — LACTATE DEHYDROGENASE, PLEURAL OR PERITONEAL FLUID: LD, Fluid: 164 — ABNORMAL HIGH

## 2011-01-02 LAB — CEA: CEA: 1.8

## 2011-01-02 LAB — CULTURE, BLOOD (ROUTINE X 2)
Culture: NO GROWTH
Culture: NO GROWTH

## 2011-01-02 LAB — TSH: TSH: 2.57

## 2011-01-02 LAB — AFB CULTURE WITH SMEAR (NOT AT ARMC)

## 2011-01-02 LAB — RHEUMATOID FACTOR: Rhuematoid fact SerPl-aCnc: <20

## 2011-01-02 LAB — CULTURE, BODY FLUID W GRAM STAIN -BOTTLE

## 2011-01-02 LAB — BODY FLUID CELL COUNT WITH DIFFERENTIAL: Total Nucleated Cell Count, Fluid: 13555 — ABNORMAL HIGH

## 2011-01-02 LAB — APTT: aPTT: 32

## 2011-01-02 LAB — PROTEIN, BODY FLUID: Total protein, fluid: 6.1

## 2011-01-02 LAB — PH, BODY FLUID: pH, Fluid: 7.71

## 2011-01-02 LAB — SEDIMENTATION RATE: Sed Rate: 20 — ABNORMAL HIGH

## 2011-01-02 LAB — ANA: Anti Nuclear Antibody(ANA): NEGATIVE

## 2011-03-03 ENCOUNTER — Other Ambulatory Visit: Payer: Self-pay | Admitting: Cardiology

## 2011-04-26 DIAGNOSIS — K5730 Diverticulosis of large intestine without perforation or abscess without bleeding: Secondary | ICD-10-CM | POA: Diagnosis not present

## 2011-04-26 DIAGNOSIS — J309 Allergic rhinitis, unspecified: Secondary | ICD-10-CM | POA: Diagnosis not present

## 2011-04-26 DIAGNOSIS — M1990 Unspecified osteoarthritis, unspecified site: Secondary | ICD-10-CM | POA: Diagnosis not present

## 2011-04-26 DIAGNOSIS — I10 Essential (primary) hypertension: Secondary | ICD-10-CM | POA: Diagnosis not present

## 2011-04-26 DIAGNOSIS — E782 Mixed hyperlipidemia: Secondary | ICD-10-CM | POA: Diagnosis not present

## 2011-04-26 DIAGNOSIS — C61 Malignant neoplasm of prostate: Secondary | ICD-10-CM | POA: Diagnosis not present

## 2011-04-26 DIAGNOSIS — G2589 Other specified extrapyramidal and movement disorders: Secondary | ICD-10-CM | POA: Diagnosis not present

## 2011-04-26 DIAGNOSIS — M109 Gout, unspecified: Secondary | ICD-10-CM | POA: Diagnosis not present

## 2011-05-10 ENCOUNTER — Other Ambulatory Visit: Payer: Self-pay

## 2011-05-10 ENCOUNTER — Other Ambulatory Visit (INDEPENDENT_AMBULATORY_CARE_PROVIDER_SITE_OTHER): Payer: Medicare Other | Admitting: *Deleted

## 2011-05-10 ENCOUNTER — Ambulatory Visit (INDEPENDENT_AMBULATORY_CARE_PROVIDER_SITE_OTHER): Payer: Medicare Other | Admitting: Physician Assistant

## 2011-05-10 ENCOUNTER — Encounter: Payer: Self-pay | Admitting: Physician Assistant

## 2011-05-10 VITALS — BP 143/80 | HR 62 | Ht 72.0 in | Wt 234.0 lb

## 2011-05-10 DIAGNOSIS — I498 Other specified cardiac arrhythmias: Secondary | ICD-10-CM | POA: Diagnosis not present

## 2011-05-10 DIAGNOSIS — I319 Disease of pericardium, unspecified: Secondary | ICD-10-CM

## 2011-05-10 DIAGNOSIS — I10 Essential (primary) hypertension: Secondary | ICD-10-CM

## 2011-05-10 DIAGNOSIS — R002 Palpitations: Secondary | ICD-10-CM

## 2011-05-10 DIAGNOSIS — I471 Supraventricular tachycardia: Secondary | ICD-10-CM

## 2011-05-10 NOTE — Assessment & Plan Note (Signed)
Followed by Dr. Daniel 

## 2011-05-10 NOTE — Progress Notes (Signed)
HPI: Patient referred for evaluation of palpitations, per Dr. Reuel Boom.  He was last seen here in clinic, 7/12, by Dr. Andee Lineman. He has history of SVT and is intolerant to amiodarone. He also has history of recurrent pericardial effusion, status post pericardial window following his second presentation. Last echocardiogram in 2010 indicated normal LVF (EF 60-65%), with a small pericardial effusion, and a small PFO with left-right shunting.  Clinically, he denies any interim development of exertional CP. He has chronic, stable DOE with no recent exacerbation. His only complaint is that of a singular episode of "tickling", which occured 2 weeks ago, lasting 10 minutes, and while sitting. He states that it was not palpitations, which he has had in the past, and that it also was not frank CP. It was over the left upper chest region.  Allergies  Allergen Reactions  . Amiodarone Hcl     REACTION: rash    Current Outpatient Prescriptions  Medication Sig Dispense Refill  . allopurinol (ZYLOPRIM) 300 MG tablet Take 300 mg by mouth daily.      Marland Kitchen aspirin 81 MG EC tablet Take 81 mg by mouth daily.        . benazepril (LOTENSIN) 20 MG tablet Take 20 mg by mouth daily.        . calcium citrate-vitamin D 200-200 MG-UNIT TABS Take 1 tablet by mouth daily.      . chlorthalidone (HYGROTON) 25 MG tablet TAKE ONE TABLET BY MOUTH EVERY DAY  30 tablet  6  . colchicine 0.6 MG tablet Take 0.6 mg by mouth daily.      . fluticasone (FLONASE) 50 MCG/ACT nasal spray Place 2 sprays into the nose daily.      . metoprolol (TOPROL-XL) 50 MG 24 hr tablet TAKE ONE TABLET BY MOUTH EVERY DAY  30 tablet  6  . Omega-3 Fatty Acids (FISH OIL TRIPLE STRENGTH) 1360 MG CAPS Take 1 capsule by mouth daily. 1400 mg capsule       . verapamil (CALAN-SR) 180 MG CR tablet Take 1 tablet (180 mg total) by mouth daily.  30 tablet  6    Past Medical History  Diagnosis Date  . Pericardial effusion     Hx of idiopathic, recurrent pericardial  effusion. s/p pericardial window. moderate sized effusion by echocardiogram 6/09  . Supraventricular tachycardia     Hx of it. quiescent  . LVF (left ventricular failure)     normal  . Allergy history, drug     amiodarone - secondary to rash. aspirin- secondary to hives    No past surgical history on file.  History   Social History  . Marital Status: Married    Spouse Name: N/A    Number of Children: N/A  . Years of Education: N/A   Occupational History  . Not on file.   Social History Main Topics  . Smoking status: Former Smoker -- 0.3 packs/day for 3 years    Types: Cigarettes    Quit date: 03/27/1960  . Smokeless tobacco: Never Used  . Alcohol Use: Not on file  . Drug Use: Not on file  . Sexually Active: Not on file   Other Topics Concern  . Not on file   Social History Narrative  . No narrative on file    No family history on file.  ROS: no nausea, vomiting; no fever, chills; no melena, hematochezia; no claudication  PHYSICAL EXAM: BP 143/80  Pulse 62  Ht 6' (1.829 m)  Wt 234 lb (106.142  kg)  BMI 31.74 kg/m2  SpO2 97% GENERAL:71 year old male, sitting upright; NAD HEENT: NCAT, PERRLA, EOMI; sclera clear; no xanthelasma NECK: palpable bilateral carotid pulses, no bruits; no JVD; no TM LUNGS: CTA bilaterally CARDIAC: RRR (S1, S2); no significant murmurs; no rubs or gallops ABDOMEN: soft, non-tender; intact BS EXTREMETIES: intact distal pulses; no significant peripheral edema SKIN: warm/dry; no obvious rash/lesions MUSCULOSKELETAL: no joint deformity NEURO: no focal deficit; NL affect   EKG: reviewed and available in Electronic Records   ASSESSMENT & PLAN:

## 2011-05-10 NOTE — Patient Instructions (Signed)
Follow up as scheduled. Your physician recommends that you continue on your current medications as directed. Please refer to the Current Medication list given to you today. Your physician has requested that you have an echocardiogram. Echocardiography is a painless test that uses sound waves to create images of your heart. It provides your doctor with information about the size and shape of your heart and how well your heart's chambers and valves are working. This procedure takes approximately one hour. There are no restrictions for this procedure. Your physician recommends that you go to the [facility] for lab work: BMET/TSH

## 2011-05-10 NOTE — Assessment & Plan Note (Signed)
Patient denies any frank palpitations, and remains in NSR by EKG.

## 2011-05-10 NOTE — Assessment & Plan Note (Signed)
Will order a 2D echo to reassess LVF and r/o of any residual pericardial effusion, as well as r/o of severe valvular heart disease. His last echocardiogram in 2010 suggested a small pericardial effusion, as well as a small PFO with L-R shunt. Will arrange for early f/u in 2 weeks for review.

## 2011-05-12 ENCOUNTER — Encounter: Payer: Self-pay | Admitting: *Deleted

## 2011-05-29 ENCOUNTER — Encounter: Payer: Self-pay | Admitting: Physician Assistant

## 2011-05-29 ENCOUNTER — Ambulatory Visit (INDEPENDENT_AMBULATORY_CARE_PROVIDER_SITE_OTHER): Payer: Medicare Other | Admitting: Physician Assistant

## 2011-05-29 DIAGNOSIS — I10 Essential (primary) hypertension: Secondary | ICD-10-CM

## 2011-05-29 DIAGNOSIS — I319 Disease of pericardium, unspecified: Secondary | ICD-10-CM

## 2011-05-29 DIAGNOSIS — R002 Palpitations: Secondary | ICD-10-CM | POA: Diagnosis not present

## 2011-05-29 NOTE — Progress Notes (Signed)
HPI: Scheduled early followup.  Patient returns to discuss results of recent echocardiogram, which I ordered at time of recent scheduled followup. He presented with history of recurrent, idiopathic pericardial effusion, status post pericardial window. His previous echocardiogram in 2010 indicated residual, small pericardial effusion; small PFO (L.-R. shunting). Although he reported no CP or exacerbation of chronic DOE, he reported a singular episode of "tickling" overlying the left chest, but denied frank tachycardia palpitations.  I ordered a followup echo, which indicated continued, preserved LVF (EF 65-70%), with no focal WMAS, and a small pericardial effusion.  Clinically, patient continues to report no interim CP or significant DOE. He also reports no tachycardia palpitations.  Allergies  Allergen Reactions  . Amiodarone Hcl     REACTION: rash    Current Outpatient Prescriptions  Medication Sig Dispense Refill  . allopurinol (ZYLOPRIM) 300 MG tablet Take 300 mg by mouth daily.      Marland Kitchen aspirin 81 MG EC tablet Take 81 mg by mouth daily.        . benazepril (LOTENSIN) 20 MG tablet Take 20 mg by mouth daily.        . chlorthalidone (HYGROTON) 25 MG tablet TAKE ONE TABLET BY MOUTH EVERY DAY  30 tablet  6  . fluticasone (FLONASE) 50 MCG/ACT nasal spray Place 2 sprays into the nose daily.      . metoprolol (TOPROL-XL) 50 MG 24 hr tablet TAKE ONE TABLET BY MOUTH EVERY DAY  30 tablet  6  . Multiple Vitamin (MULTIVITAMIN) tablet Take 1 tablet by mouth daily.      . Omega-3 Fatty Acids (FISH OIL TRIPLE STRENGTH) 1360 MG CAPS Take 1 capsule by mouth daily. 1400 mg capsule       . verapamil (CALAN-SR) 180 MG CR tablet Take 1 tablet (180 mg total) by mouth daily.  30 tablet  6    Past Medical History  Diagnosis Date  . Pericardial effusion     Hx of idiopathic, recurrent pericardial effusion. s/p pericardial window. moderate sized effusion by echocardiogram 6/09  . Supraventricular tachycardia       Hx of it. quiescent  . LVF (left ventricular failure)     normal  . Allergy history, drug     amiodarone - secondary to rash. aspirin- secondary to hives    No past surgical history on file.  History   Social History  . Marital Status: Married    Spouse Name: N/A    Number of Children: N/A  . Years of Education: N/A   Occupational History  . Not on file.   Social History Main Topics  . Smoking status: Former Smoker -- 0.3 packs/day for 3 years    Types: Cigarettes    Quit date: 03/27/1960  . Smokeless tobacco: Never Used  . Alcohol Use: Not on file  . Drug Use: Not on file  . Sexually Active: Not on file   Other Topics Concern  . Not on file   Social History Narrative  . No narrative on file    No family history on file.  ROS: no nausea, vomiting; no fever, chills; no melena, hematochezia; no claudication  PHYSICAL EXAM: BP 150/70  Pulse 69  Ht 6' (1.829 m)  Wt 233 lb (105.688 kg)  BMI 31.60 kg/m6 GENERAL:71 year old male, sitting upright; NAD  HEENT: NCAT, PERRLA, EOMI; sclera clear; no xanthelasma  NECK: palpable bilateral carotid pulses, no bruits; no JVD; no TM  LUNGS: CTA bilaterally  CARDIAC: RRR (S1, S2); no significant  murmurs; no rubs or gallops  ABDOMEN: soft, non-tender; intact BS  EXTREMETIES: intact distal pulses; no significant peripheral edema  SKIN: warm/dry; no obvious rash/lesions  MUSCULOSKELETAL: no joint deformity  NEURO: no focal deficit; NL affect    EKG:   ASSESSMENT & PLAN:

## 2011-05-29 NOTE — Assessment & Plan Note (Addendum)
Quiescent on current dose of beta blocker; in NSR by recent EKG.

## 2011-05-29 NOTE — Assessment & Plan Note (Signed)
Followed by Dr. Daniel 

## 2011-05-29 NOTE — Assessment & Plan Note (Signed)
No further workup indicated. We'll plan a return clinic visit with Dr. Andee Lineman in 6 months.

## 2011-05-29 NOTE — Patient Instructions (Signed)
Your physician wants you to follow-up in: 6 months. You will receive a reminder letter in the mail one-two months in advance. If you don't receive a letter, please call our office to schedule the follow-up appointment. Your physician recommends that you continue on your current medications as directed. Please refer to the Current Medication list given to you today. 

## 2011-06-20 ENCOUNTER — Other Ambulatory Visit: Payer: Self-pay | Admitting: Cardiology

## 2011-07-28 DIAGNOSIS — L03039 Cellulitis of unspecified toe: Secondary | ICD-10-CM | POA: Diagnosis not present

## 2011-07-28 DIAGNOSIS — M109 Gout, unspecified: Secondary | ICD-10-CM | POA: Diagnosis not present

## 2011-07-28 DIAGNOSIS — L02619 Cutaneous abscess of unspecified foot: Secondary | ICD-10-CM | POA: Diagnosis not present

## 2011-08-14 DIAGNOSIS — C61 Malignant neoplasm of prostate: Secondary | ICD-10-CM | POA: Diagnosis not present

## 2011-08-16 DIAGNOSIS — M109 Gout, unspecified: Secondary | ICD-10-CM | POA: Diagnosis not present

## 2011-08-24 DIAGNOSIS — E7800 Pure hypercholesterolemia, unspecified: Secondary | ICD-10-CM | POA: Diagnosis not present

## 2011-08-24 DIAGNOSIS — M109 Gout, unspecified: Secondary | ICD-10-CM | POA: Diagnosis not present

## 2011-08-24 DIAGNOSIS — M1990 Unspecified osteoarthritis, unspecified site: Secondary | ICD-10-CM | POA: Diagnosis not present

## 2011-08-24 DIAGNOSIS — E782 Mixed hyperlipidemia: Secondary | ICD-10-CM | POA: Diagnosis not present

## 2011-08-24 DIAGNOSIS — I10 Essential (primary) hypertension: Secondary | ICD-10-CM | POA: Diagnosis not present

## 2011-08-25 DIAGNOSIS — M109 Gout, unspecified: Secondary | ICD-10-CM | POA: Diagnosis not present

## 2011-08-29 DIAGNOSIS — E782 Mixed hyperlipidemia: Secondary | ICD-10-CM | POA: Diagnosis not present

## 2011-08-29 DIAGNOSIS — I10 Essential (primary) hypertension: Secondary | ICD-10-CM | POA: Diagnosis not present

## 2011-08-29 DIAGNOSIS — M109 Gout, unspecified: Secondary | ICD-10-CM | POA: Diagnosis not present

## 2011-08-29 DIAGNOSIS — C61 Malignant neoplasm of prostate: Secondary | ICD-10-CM | POA: Diagnosis not present

## 2011-08-29 DIAGNOSIS — J309 Allergic rhinitis, unspecified: Secondary | ICD-10-CM | POA: Diagnosis not present

## 2011-08-29 DIAGNOSIS — K5730 Diverticulosis of large intestine without perforation or abscess without bleeding: Secondary | ICD-10-CM | POA: Diagnosis not present

## 2011-08-29 DIAGNOSIS — M1990 Unspecified osteoarthritis, unspecified site: Secondary | ICD-10-CM | POA: Diagnosis not present

## 2011-08-29 DIAGNOSIS — G2589 Other specified extrapyramidal and movement disorders: Secondary | ICD-10-CM | POA: Diagnosis not present

## 2011-09-25 ENCOUNTER — Other Ambulatory Visit: Payer: Self-pay | Admitting: Cardiology

## 2011-10-19 ENCOUNTER — Ambulatory Visit (INDEPENDENT_AMBULATORY_CARE_PROVIDER_SITE_OTHER): Payer: Medicare Other | Admitting: Cardiovascular Disease

## 2011-10-19 ENCOUNTER — Encounter: Payer: Self-pay | Admitting: Cardiovascular Disease

## 2011-10-19 VITALS — BP 117/71 | HR 59 | Ht 73.0 in | Wt 230.8 lb

## 2011-10-19 DIAGNOSIS — I10 Essential (primary) hypertension: Secondary | ICD-10-CM

## 2011-10-19 DIAGNOSIS — I319 Disease of pericardium, unspecified: Secondary | ICD-10-CM

## 2011-10-19 DIAGNOSIS — R002 Palpitations: Secondary | ICD-10-CM | POA: Diagnosis not present

## 2011-10-19 NOTE — Assessment & Plan Note (Signed)
No recurrent arrhythmia. Continue Toprol and verapamil.

## 2011-10-19 NOTE — Assessment & Plan Note (Signed)
The patient is doing very well and has no cardiac symptoms. Most recent echocardiogram in February showed small pericardial effusion. I recommend followup in 6 months with an echocardiogram repeated at that time.

## 2011-10-19 NOTE — Progress Notes (Signed)
HPI  The patient is a 71 year old male who is here today for a followup visit. He has a history of idiopathic pericardial effusion and paroxysmal supraventricular tachycardia without recurrence. He had pericardiocentesis done in 2007 but had recurrent effusion that ultimately required a pericardial window. Last echocardiogram was done in February of this year which showed small pericardial effusion with normal LV systolic function. The patient has been doing very well and denies any chest pain, dyspnea, orthopnea or palpitations.  Allergies  Allergen Reactions  . Amiodarone Hcl     REACTION: rash     Current Outpatient Prescriptions on File Prior to Visit  Medication Sig Dispense Refill  . allopurinol (ZYLOPRIM) 300 MG tablet Take 300 mg by mouth daily.      Marland Kitchen aspirin 81 MG EC tablet Take 81 mg by mouth daily.        . benazepril (LOTENSIN) 20 MG tablet Take 20 mg by mouth daily.        . chlorthalidone (HYGROTON) 25 MG tablet TAKE ONE TABLET BY MOUTH EVERY DAY  30 tablet  6  . fluticasone (FLONASE) 50 MCG/ACT nasal spray Place 2 sprays into the nose as needed.       . metoprolol succinate (TOPROL-XL) 50 MG 24 hr tablet TAKE ONE TABLET BY MOUTH EVERY DAY  30 tablet  6  . Multiple Vitamin (MULTIVITAMIN) tablet Take 1 tablet by mouth daily.      . Omega-3 Fatty Acids (FISH OIL TRIPLE STRENGTH) 1360 MG CAPS Take 1 capsule by mouth daily. 1400 mg capsule       . verapamil (CALAN-SR) 180 MG CR tablet TAKE ONE TABLET BY MOUTH EVERY DAY  30 tablet  6     Past Medical History  Diagnosis Date  . Pericardial effusion     Hx of idiopathic, recurrent pericardial effusion. s/p pericardial window. moderate sized effusion by echocardiogram 6/09  . Supraventricular tachycardia     Hx of it. quiescent  . LVF (left ventricular failure)     normal  . Allergy history, drug     amiodarone - secondary to rash. aspirin- secondary to hives     No past surgical history on file.   No family  history on file.   History   Social History  . Marital Status: Married    Spouse Name: N/A    Number of Children: N/A  . Years of Education: N/A   Occupational History  . Not on file.   Social History Main Topics  . Smoking status: Former Smoker -- 0.3 packs/day for 3 years    Types: Cigarettes    Quit date: 03/27/1960  . Smokeless tobacco: Never Used  . Alcohol Use: Not on file  . Drug Use: Not on file  . Sexually Active: Not on file   Other Topics Concern  . Not on file   Social History Narrative  . No narrative on file     PHYSICAL EXAM   BP 117/71  Pulse 59  Ht 6\' 1"  (1.854 m)  Wt 230 lb 12.8 oz (104.69 kg)  BMI 30.45 kg/m2  SpO2 95%  Constitutional: He is oriented to person, place, and time. He appears well-developed and well-nourished. No distress.  HENT: No nasal discharge.  Head: Normocephalic and atraumatic.  Eyes: Pupils are equal and round. Right eye exhibits no discharge. Left eye exhibits no discharge.  Neck: Normal range of motion. Neck supple. No JVD present. No thyromegaly present.  Cardiovascular: Normal rate, regular  rhythm, normal heart sounds and. Exam reveals no gallop and no friction rub. No murmur heard.  Pulmonary/Chest: Effort normal and breath sounds normal. No stridor. No respiratory distress. He has no wheezes. He has no rales. He exhibits no tenderness.  Abdominal: Soft. Bowel sounds are normal. He exhibits no distension. There is no tenderness. There is no rebound and no guarding.  Musculoskeletal: Normal range of motion. He exhibits no edema and no tenderness.  Neurological: He is alert and oriented to person, place, and time. Coordination normal.  Skin: Skin is warm and dry. No rash noted. He is not diaphoretic. No erythema. No pallor.  Psychiatric: He has a normal mood and affect. His behavior is normal. Judgment and thought content normal.        ASSESSMENT AND PLAN

## 2011-10-19 NOTE — Patient Instructions (Addendum)
Follow up in 6 months 

## 2011-10-19 NOTE — Assessment & Plan Note (Signed)
His blood pressure is well controlled. Continue current medications. 

## 2011-11-01 DIAGNOSIS — M25819 Other specified joint disorders, unspecified shoulder: Secondary | ICD-10-CM | POA: Diagnosis not present

## 2011-11-01 DIAGNOSIS — M25569 Pain in unspecified knee: Secondary | ICD-10-CM | POA: Diagnosis not present

## 2011-11-01 DIAGNOSIS — M25469 Effusion, unspecified knee: Secondary | ICD-10-CM | POA: Diagnosis not present

## 2011-11-02 DIAGNOSIS — M25469 Effusion, unspecified knee: Secondary | ICD-10-CM | POA: Diagnosis not present

## 2011-11-02 DIAGNOSIS — M25569 Pain in unspecified knee: Secondary | ICD-10-CM | POA: Diagnosis not present

## 2011-11-02 DIAGNOSIS — IMO0002 Reserved for concepts with insufficient information to code with codable children: Secondary | ICD-10-CM | POA: Diagnosis not present

## 2011-11-02 DIAGNOSIS — M1710 Unilateral primary osteoarthritis, unspecified knee: Secondary | ICD-10-CM | POA: Diagnosis not present

## 2011-11-08 DIAGNOSIS — M25469 Effusion, unspecified knee: Secondary | ICD-10-CM | POA: Diagnosis not present

## 2011-11-08 DIAGNOSIS — M23302 Other meniscus derangements, unspecified lateral meniscus, unspecified knee: Secondary | ICD-10-CM | POA: Diagnosis not present

## 2011-12-12 DIAGNOSIS — E7800 Pure hypercholesterolemia, unspecified: Secondary | ICD-10-CM | POA: Diagnosis not present

## 2011-12-12 DIAGNOSIS — E782 Mixed hyperlipidemia: Secondary | ICD-10-CM | POA: Diagnosis not present

## 2011-12-12 DIAGNOSIS — I10 Essential (primary) hypertension: Secondary | ICD-10-CM | POA: Diagnosis not present

## 2011-12-12 DIAGNOSIS — M109 Gout, unspecified: Secondary | ICD-10-CM | POA: Diagnosis not present

## 2011-12-19 DIAGNOSIS — Z23 Encounter for immunization: Secondary | ICD-10-CM | POA: Diagnosis not present

## 2011-12-19 DIAGNOSIS — M1990 Unspecified osteoarthritis, unspecified site: Secondary | ICD-10-CM | POA: Diagnosis not present

## 2011-12-19 DIAGNOSIS — J309 Allergic rhinitis, unspecified: Secondary | ICD-10-CM | POA: Diagnosis not present

## 2011-12-19 DIAGNOSIS — M109 Gout, unspecified: Secondary | ICD-10-CM | POA: Diagnosis not present

## 2011-12-19 DIAGNOSIS — C61 Malignant neoplasm of prostate: Secondary | ICD-10-CM | POA: Diagnosis not present

## 2011-12-19 DIAGNOSIS — G2589 Other specified extrapyramidal and movement disorders: Secondary | ICD-10-CM | POA: Diagnosis not present

## 2011-12-19 DIAGNOSIS — I10 Essential (primary) hypertension: Secondary | ICD-10-CM | POA: Diagnosis not present

## 2011-12-19 DIAGNOSIS — E782 Mixed hyperlipidemia: Secondary | ICD-10-CM | POA: Diagnosis not present

## 2012-01-20 ENCOUNTER — Other Ambulatory Visit: Payer: Self-pay | Admitting: Cardiology

## 2012-01-22 ENCOUNTER — Other Ambulatory Visit: Payer: Self-pay | Admitting: *Deleted

## 2012-01-22 MED ORDER — METOPROLOL SUCCINATE ER 50 MG PO TB24: 50.0000 mg | ORAL_TABLET | Freq: Every day | ORAL | Status: DC

## 2012-01-26 DIAGNOSIS — IMO0002 Reserved for concepts with insufficient information to code with codable children: Secondary | ICD-10-CM | POA: Diagnosis not present

## 2012-01-26 DIAGNOSIS — M1710 Unilateral primary osteoarthritis, unspecified knee: Secondary | ICD-10-CM | POA: Diagnosis not present

## 2012-01-26 DIAGNOSIS — S83289A Other tear of lateral meniscus, current injury, unspecified knee, initial encounter: Secondary | ICD-10-CM | POA: Diagnosis not present

## 2012-01-28 ENCOUNTER — Other Ambulatory Visit: Payer: Self-pay | Admitting: Cardiology

## 2012-02-15 DIAGNOSIS — H40019 Open angle with borderline findings, low risk, unspecified eye: Secondary | ICD-10-CM | POA: Diagnosis not present

## 2012-03-08 DIAGNOSIS — H40009 Preglaucoma, unspecified, unspecified eye: Secondary | ICD-10-CM | POA: Diagnosis not present

## 2012-04-03 DIAGNOSIS — J309 Allergic rhinitis, unspecified: Secondary | ICD-10-CM | POA: Diagnosis not present

## 2012-04-06 DIAGNOSIS — J0100 Acute maxillary sinusitis, unspecified: Secondary | ICD-10-CM | POA: Diagnosis not present

## 2012-04-06 DIAGNOSIS — J069 Acute upper respiratory infection, unspecified: Secondary | ICD-10-CM | POA: Diagnosis not present

## 2012-04-15 DIAGNOSIS — H40019 Open angle with borderline findings, low risk, unspecified eye: Secondary | ICD-10-CM | POA: Diagnosis not present

## 2012-04-17 DIAGNOSIS — I10 Essential (primary) hypertension: Secondary | ICD-10-CM | POA: Diagnosis not present

## 2012-04-17 DIAGNOSIS — G2589 Other specified extrapyramidal and movement disorders: Secondary | ICD-10-CM | POA: Diagnosis not present

## 2012-04-17 DIAGNOSIS — E782 Mixed hyperlipidemia: Secondary | ICD-10-CM | POA: Diagnosis not present

## 2012-04-17 DIAGNOSIS — M109 Gout, unspecified: Secondary | ICD-10-CM | POA: Diagnosis not present

## 2012-04-17 DIAGNOSIS — C61 Malignant neoplasm of prostate: Secondary | ICD-10-CM | POA: Diagnosis not present

## 2012-04-25 DIAGNOSIS — E7800 Pure hypercholesterolemia, unspecified: Secondary | ICD-10-CM | POA: Diagnosis not present

## 2012-05-13 ENCOUNTER — Other Ambulatory Visit: Payer: Self-pay | Admitting: Cardiology

## 2012-05-13 MED ORDER — VERAPAMIL HCL ER 180 MG PO TBCR: 180.0000 mg | EXTENDED_RELEASE_TABLET | Freq: Every day | ORAL | Status: DC

## 2012-06-27 ENCOUNTER — Telehealth: Payer: Self-pay | Admitting: Physician Assistant

## 2012-06-27 DIAGNOSIS — R072 Precordial pain: Secondary | ICD-10-CM | POA: Diagnosis not present

## 2012-06-27 DIAGNOSIS — I319 Disease of pericardium, unspecified: Secondary | ICD-10-CM | POA: Diagnosis not present

## 2012-06-27 DIAGNOSIS — I10 Essential (primary) hypertension: Secondary | ICD-10-CM | POA: Diagnosis not present

## 2012-06-27 DIAGNOSIS — R0609 Other forms of dyspnea: Secondary | ICD-10-CM | POA: Diagnosis not present

## 2012-06-27 DIAGNOSIS — Z7982 Long term (current) use of aspirin: Secondary | ICD-10-CM | POA: Diagnosis not present

## 2012-06-27 DIAGNOSIS — Z85828 Personal history of other malignant neoplasm of skin: Secondary | ICD-10-CM | POA: Diagnosis not present

## 2012-06-27 DIAGNOSIS — Z888 Allergy status to other drugs, medicaments and biological substances status: Secondary | ICD-10-CM | POA: Diagnosis not present

## 2012-06-27 DIAGNOSIS — E876 Hypokalemia: Secondary | ICD-10-CM | POA: Diagnosis not present

## 2012-06-27 DIAGNOSIS — M109 Gout, unspecified: Secondary | ICD-10-CM | POA: Diagnosis not present

## 2012-06-27 DIAGNOSIS — R079 Chest pain, unspecified: Secondary | ICD-10-CM | POA: Diagnosis not present

## 2012-06-27 DIAGNOSIS — I4891 Unspecified atrial fibrillation: Secondary | ICD-10-CM | POA: Diagnosis not present

## 2012-06-27 DIAGNOSIS — Z8546 Personal history of malignant neoplasm of prostate: Secondary | ICD-10-CM | POA: Diagnosis not present

## 2012-06-27 DIAGNOSIS — Z79899 Other long term (current) drug therapy: Secondary | ICD-10-CM | POA: Diagnosis not present

## 2012-06-27 NOTE — Telephone Encounter (Signed)
Last seen 09/2011, by Dr Kirke Corin, with recommendation to f/u in 6 months, preceded by 2D echo.

## 2012-06-27 NOTE — Telephone Encounter (Signed)
This morning at 5:45 BP was 113/80 R & 116/84 L - HR was 139.   At 6:00 - 133/75 HR 123  States wife called Dr. Reuel Boom & he advised him to take 50mg  Metoprolol this morning instead of 25mg .  Patient states that Dr. Reuel Boom thought that he was only on 25mg  daily, so he was advising to take and extra 25mg  this morning.    At 6:00 am after call above, patient went ahead and took all of his morning medications.  Including his Metoprolol which is a 50mg  daily.  At 6:30 BP 134/66  HR down to 65.    X last 3-4 days, c/o uncomfortable feeling in chest.  No real chest pain.  Did feel a little light headed yesterday.  Does state that he feels a little more fatigued than usual.  States he is feeling better now, but has just not felt like his usual self the last couple of days.    OV scheduled for Monday, 4/7 with Gene.

## 2012-06-27 NOTE — Telephone Encounter (Signed)
WIFE CALLING FOR HUSBAND  HEART RATE 139 AT 6:00. SPOKE TO DR Reuel Boom ADVISE HIM TO TAKE ANOTHER METOPROLOL 50MG .    WIFE DIDN'T KNOW WHAT ALL HE HAS TAKEN TODAY AS FAR AS MEDICATION   DR Reuel Boom WANTS HIM SEEN TODAY PER WIFE.

## 2012-06-27 NOTE — Telephone Encounter (Signed)
Per Gene Serpe, PA - currently admitted to [facility].

## 2012-06-27 NOTE — Telephone Encounter (Signed)
Recommend pt be brought in for RN visit with vitals/EKG, and then scheduled for 2D echo, before formal f/u with Dr Kirke Corin next week.

## 2012-06-28 DIAGNOSIS — M109 Gout, unspecified: Secondary | ICD-10-CM | POA: Diagnosis not present

## 2012-06-28 DIAGNOSIS — Z85828 Personal history of other malignant neoplasm of skin: Secondary | ICD-10-CM | POA: Diagnosis not present

## 2012-06-28 DIAGNOSIS — R079 Chest pain, unspecified: Secondary | ICD-10-CM | POA: Diagnosis not present

## 2012-06-28 DIAGNOSIS — I4891 Unspecified atrial fibrillation: Secondary | ICD-10-CM | POA: Diagnosis not present

## 2012-06-28 DIAGNOSIS — I319 Disease of pericardium, unspecified: Secondary | ICD-10-CM | POA: Diagnosis not present

## 2012-06-28 DIAGNOSIS — I10 Essential (primary) hypertension: Secondary | ICD-10-CM | POA: Diagnosis not present

## 2012-07-01 ENCOUNTER — Ambulatory Visit: Payer: Medicare Other | Admitting: Physician Assistant

## 2012-07-01 ENCOUNTER — Other Ambulatory Visit: Payer: Self-pay | Admitting: *Deleted

## 2012-07-01 DIAGNOSIS — I313 Pericardial effusion (noninflammatory): Secondary | ICD-10-CM

## 2012-07-11 ENCOUNTER — Other Ambulatory Visit (INDEPENDENT_AMBULATORY_CARE_PROVIDER_SITE_OTHER): Payer: Medicare Other

## 2012-07-11 ENCOUNTER — Other Ambulatory Visit: Payer: Self-pay

## 2012-07-11 DIAGNOSIS — I313 Pericardial effusion (noninflammatory): Secondary | ICD-10-CM

## 2012-07-11 DIAGNOSIS — I319 Disease of pericardium, unspecified: Secondary | ICD-10-CM | POA: Diagnosis not present

## 2012-07-15 ENCOUNTER — Ambulatory Visit (INDEPENDENT_AMBULATORY_CARE_PROVIDER_SITE_OTHER): Payer: Medicare Other | Admitting: Physician Assistant

## 2012-07-15 ENCOUNTER — Encounter: Payer: Self-pay | Admitting: Physician Assistant

## 2012-07-15 VITALS — BP 123/70 | HR 54 | Ht 72.0 in | Wt 237.8 lb

## 2012-07-15 DIAGNOSIS — I313 Pericardial effusion (noninflammatory): Secondary | ICD-10-CM

## 2012-07-15 DIAGNOSIS — I319 Disease of pericardium, unspecified: Secondary | ICD-10-CM

## 2012-07-15 DIAGNOSIS — I4891 Unspecified atrial fibrillation: Secondary | ICD-10-CM | POA: Diagnosis not present

## 2012-07-15 DIAGNOSIS — I10 Essential (primary) hypertension: Secondary | ICD-10-CM

## 2012-07-15 DIAGNOSIS — I480 Paroxysmal atrial fibrillation: Secondary | ICD-10-CM | POA: Insufficient documentation

## 2012-07-15 MED ORDER — ASPIRIN EC 325 MG PO TBEC: 325.0000 mg | DELAYED_RELEASE_TABLET | Freq: Every day | ORAL | Status: DC

## 2012-07-15 NOTE — Progress Notes (Signed)
Primary Cardiologist: Mady Gemma, MD   HPI: Post hospital followup from [facility], following presentation with recurrent PAF RVR. He was continued on beta blocker and calcium channel blocker for rate control. Serial troponins and TSH NL. Complete echocardiogram was ordered, results as follows:   - Echocardiogram, April 4: EF > 65%; moderate/large pericardial effusion (no definite tamponade physiology);mild MR  We recommended treating patient with combination therapy of colchicine, nonsteroidals, and PPI.  A followup outpatient echocardiogram was arranged for April 17, also reviewed by Dr. Diona Browner, which suggested that the posterior pericardial effusion had decreased somewhat.   Clinically, he reports feeling progressively better, since his recent hospitalization. He does not have that chest "soreness" with which he presented. He denies pleuritic CP He denies tachycardia palpitations. Of note, he did not fill the prescription for colchicine, citing a cost of approximately $300 a month.   12-lead EKG today, reviewed by me, indicates SB 53 bpm; NSST changes  Allergies  Allergen Reactions  . Amiodarone Hcl     REACTION: rash    Current Outpatient Prescriptions  Medication Sig Dispense Refill  . allopurinol (ZYLOPRIM) 300 MG tablet Take 450 mg by mouth daily.       . benazepril (LOTENSIN) 20 MG tablet Take 20 mg by mouth daily.        . bimatoprost (LUMIGAN) 0.03 % ophthalmic solution Place 1 drop into both eyes at bedtime.      . chlorthalidone (HYGROTON) 25 MG tablet TAKE ONE TABLET BY MOUTH EVERY DAY  30 tablet  5  . fluticasone (FLONASE) 50 MCG/ACT nasal spray Place 2 sprays into the nose as needed.       . metoprolol succinate (TOPROL-XL) 50 MG 24 hr tablet Take 1 tablet (50 mg total) by mouth daily. Take with or immediately following a meal.  30 tablet  6  . Multiple Vitamin (MULTIVITAMIN) tablet Take 1 tablet by mouth daily.      . Omega-3 Fatty Acids (FISH OIL TRIPLE STRENGTH) 1360  MG CAPS Take 1 capsule by mouth daily. 1400 mg capsule       . pantoprazole (PROTONIX) 40 MG tablet Take 40 mg by mouth daily.       . verapamil (CALAN-SR) 180 MG CR tablet Take 1 tablet (180 mg total) by mouth at bedtime.  30 tablet  6  . aspirin EC 325 MG tablet Take 1 tablet (325 mg total) by mouth daily.       No current facility-administered medications for this visit.    Past Medical History  Diagnosis Date  . Pericardial effusion     Hx of idiopathic, recurrent pericardial effusion. s/p pericardial window. moderate sized effusion by echocardiogram 6/09  . Supraventricular tachycardia     Hx of it. quiescent  . LVF (left ventricular failure)     normal  . Allergy history, drug     amiodarone - secondary to rash. aspirin- secondary to hives  . Paroxysmal atrial fibrillation     No past surgical history on file.  History   Social History  . Marital Status: Married    Spouse Name: N/A    Number of Children: N/A  . Years of Education: N/A   Occupational History  . Not on file.   Social History Main Topics  . Smoking status: Former Smoker -- 0.30 packs/day for 3 years    Types: Cigarettes    Quit date: 03/27/1960  . Smokeless tobacco: Never Used  . Alcohol Use: Not on file  .  Drug Use: Not on file  . Sexually Active: Not on file   Other Topics Concern  . Not on file   Social History Narrative  . No narrative on file    No family history on file.  ROS: no nausea, vomiting; no fever, chills; no melena, hematochezia; no claudication  PHYSICAL EXAM: BP 123/70  Pulse 54  Ht 6' (1.829 m)  Wt 237 lb 12.8 oz (107.865 kg)  BMI 32.24 kg/m2  SpO2 35% GENERAL: 72 year-old male; NAD HEENT: NCAT, PERRLA, EOMI; sclera clear; no xanthelasma NECK: palpable bilateral carotid pulses, no bruits; no JVD; no TM LUNGS: CTA bilaterally CARDIAC: RRR (S1, S2); no significant murmurs; no rubs or gallops ABDOMEN: soft, non-tender; intact BS EXTREMETIES: no significant  peripheral edema SKIN: warm/dry; no obvious rash/lesions MUSCULOSKELETAL: no joint deformity NEURO: no focal deficit; NL affect   EKG: reviewed and available in Electronic Records   ASSESSMENT & PLAN:  PERICARDIAL EFFUSION A recent followup complete echocardiogram suggested some decrease in the size of the effusion, although still quantified as moderate sized. Normal LVF (EF 65-70%), with no focal WMAs. Following review with Dr. Diona Browner, recommendation is as follows: Continue conservative management with ASA, which will be increased to 325 mg daily. The patient is unable to afford the cost of colchicine, which is the recommended treatment in this case. We will then repeat a complete echocardiogram in 3 months and, if there is no significant improvement, consider a CT scan of the chest. Additionally, we will then refer patient back to vascular surgery in [place], for consideration of a possible repeat pericardial window.   Paroxysmal atrial fibrillation Patient has spontaneously converted back to NSR, since recent hospitalization. This was initially diagnosed in 2008, when he was initially diagnosed with her cardial effusion. He is a CHADS of 1 (HTN), and we'll continue treatment with ASA, to be increased to full dose, as outlined above. Patient to remain on both verapamil and Toprol at current doses, as he had been in the past.  ESSENTIAL HYPERTENSION, BENIGN Well-controlled on current regimen    Gene Burhanuddin Kohlmann, PAC

## 2012-07-15 NOTE — Assessment & Plan Note (Signed)
Well-controlled on current regimen. ?

## 2012-07-15 NOTE — Patient Instructions (Signed)
   Increase Aspirin to 325mg  daily Continue all other current medications. Echo - do in 3 months just before next office visit Follow up in  3 months

## 2012-07-15 NOTE — Assessment & Plan Note (Signed)
Patient has spontaneously converted back to NSR, since recent hospitalization. This was initially diagnosed in 2008, when he was initially diagnosed with her cardial effusion. He is a CHADS of 1 (HTN), and we'll continue treatment with ASA, to be increased to full dose, as outlined above. Patient to remain on both verapamil and Toprol at current doses, as he had been in the past.

## 2012-07-15 NOTE — Assessment & Plan Note (Signed)
A recent followup complete echocardiogram suggested some decrease in the size of the effusion, although still quantified as moderate sized. Normal LVF (EF 65-70%), with no focal WMAs. Following review with Dr. Diona Browner, recommendation is as follows: Continue conservative management with ASA, which will be increased to 325 mg daily. The patient is unable to afford the cost of colchicine, which is the recommended treatment in this case. We will then repeat a complete echocardiogram in 3 months and, if there is no significant improvement, consider a CT scan of the chest. Additionally, we will then refer patient back to vascular surgery in [place], for consideration of a possible repeat pericardial window.

## 2012-07-28 DIAGNOSIS — I300 Acute nonspecific idiopathic pericarditis: Secondary | ICD-10-CM | POA: Diagnosis not present

## 2012-07-28 DIAGNOSIS — I4891 Unspecified atrial fibrillation: Secondary | ICD-10-CM | POA: Diagnosis not present

## 2012-08-12 ENCOUNTER — Other Ambulatory Visit: Payer: Self-pay | Admitting: Physician Assistant

## 2012-08-26 DIAGNOSIS — C61 Malignant neoplasm of prostate: Secondary | ICD-10-CM | POA: Diagnosis not present

## 2012-08-26 DIAGNOSIS — Z8546 Personal history of malignant neoplasm of prostate: Secondary | ICD-10-CM | POA: Diagnosis not present

## 2012-09-02 DIAGNOSIS — G2589 Other specified extrapyramidal and movement disorders: Secondary | ICD-10-CM | POA: Diagnosis not present

## 2012-09-02 DIAGNOSIS — K5730 Diverticulosis of large intestine without perforation or abscess without bleeding: Secondary | ICD-10-CM | POA: Diagnosis not present

## 2012-09-02 DIAGNOSIS — I10 Essential (primary) hypertension: Secondary | ICD-10-CM | POA: Diagnosis not present

## 2012-09-02 DIAGNOSIS — M109 Gout, unspecified: Secondary | ICD-10-CM | POA: Diagnosis not present

## 2012-09-02 DIAGNOSIS — C61 Malignant neoplasm of prostate: Secondary | ICD-10-CM | POA: Diagnosis not present

## 2012-09-02 DIAGNOSIS — E782 Mixed hyperlipidemia: Secondary | ICD-10-CM | POA: Diagnosis not present

## 2012-09-02 DIAGNOSIS — M1990 Unspecified osteoarthritis, unspecified site: Secondary | ICD-10-CM | POA: Diagnosis not present

## 2012-09-02 DIAGNOSIS — J309 Allergic rhinitis, unspecified: Secondary | ICD-10-CM | POA: Diagnosis not present

## 2012-09-23 ENCOUNTER — Other Ambulatory Visit: Payer: Self-pay | Admitting: Cardiology

## 2012-09-23 MED ORDER — PANTOPRAZOLE SODIUM 40 MG PO TBEC: 40.0000 mg | DELAYED_RELEASE_TABLET | Freq: Every day | ORAL | Status: DC

## 2012-10-03 ENCOUNTER — Other Ambulatory Visit: Payer: Self-pay

## 2012-10-03 ENCOUNTER — Other Ambulatory Visit (INDEPENDENT_AMBULATORY_CARE_PROVIDER_SITE_OTHER): Payer: Medicare Other

## 2012-10-03 DIAGNOSIS — I319 Disease of pericardium, unspecified: Secondary | ICD-10-CM | POA: Diagnosis not present

## 2012-10-03 DIAGNOSIS — I313 Pericardial effusion (noninflammatory): Secondary | ICD-10-CM

## 2012-10-04 ENCOUNTER — Telehealth: Payer: Self-pay | Admitting: *Deleted

## 2012-10-04 NOTE — Telephone Encounter (Signed)
Patient informed. 

## 2012-10-04 NOTE — Telephone Encounter (Signed)
Message copied by Eustace Moore on Fri Oct 04, 2012 11:26 AM ------      Message from: MCDOWELL, Illene Bolus      Created: Thu Oct 03, 2012  1:59 PM       Noted. Patient continues to have a moderate sized, circumferential pericardial effusion, but no tamponade physiology. Will review at office followup. ------

## 2012-10-14 DIAGNOSIS — D140 Benign neoplasm of middle ear, nasal cavity and accessory sinuses: Secondary | ICD-10-CM | POA: Diagnosis not present

## 2012-10-14 DIAGNOSIS — D485 Neoplasm of uncertain behavior of skin: Secondary | ICD-10-CM | POA: Diagnosis not present

## 2012-10-14 DIAGNOSIS — D367 Benign neoplasm of other specified sites: Secondary | ICD-10-CM | POA: Diagnosis not present

## 2012-10-15 ENCOUNTER — Ambulatory Visit (INDEPENDENT_AMBULATORY_CARE_PROVIDER_SITE_OTHER): Payer: Medicare Other | Admitting: Cardiology

## 2012-10-15 ENCOUNTER — Encounter: Payer: Self-pay | Admitting: Cardiology

## 2012-10-15 VITALS — BP 117/66 | HR 60 | Ht 72.0 in | Wt 232.0 lb

## 2012-10-15 DIAGNOSIS — I319 Disease of pericardium, unspecified: Secondary | ICD-10-CM | POA: Diagnosis not present

## 2012-10-15 DIAGNOSIS — I10 Essential (primary) hypertension: Secondary | ICD-10-CM

## 2012-10-15 DIAGNOSIS — I4891 Unspecified atrial fibrillation: Secondary | ICD-10-CM

## 2012-10-15 DIAGNOSIS — I480 Paroxysmal atrial fibrillation: Secondary | ICD-10-CM

## 2012-10-15 NOTE — Assessment & Plan Note (Signed)
Noted in the past, currently in sinus rhythm. Not anticoagulated.

## 2012-10-15 NOTE — Progress Notes (Signed)
Clinical Summary [name] is a 72 y.o.male last seen in April by Mr. Serpe PA-C. He has a history of recurrent, idiopathic pericardial effusion status post previous pericardial window in 2008. Symptomatically, he has been doing well, presently NYHA class II dyspnea, no chest pain or palpitations.  Recent echocardiogram in July showed moderate LVH with LVEF 65-70%, grade 1 diastolic dysfunction with increased filling pressures, moderate left atrial enlargement, mild right atrial enlargement, mildly dilated right ventricle, moderate pericardial effusion without tamponade physiology. I reviewed this with him today.  He reports compliance with his medications.   Allergies  Allergen Reactions  . Amiodarone Hcl     REACTION: rash    Current Outpatient Prescriptions  Medication Sig Dispense Refill  . allopurinol (ZYLOPRIM) 300 MG tablet Take 450 mg by mouth daily.       Marland Kitchen aspirin EC 325 MG tablet Take 1 tablet (325 mg total) by mouth daily.      . benazepril (LOTENSIN) 20 MG tablet Take 20 mg by mouth daily.        . bimatoprost (LUMIGAN) 0.03 % ophthalmic solution Place 1 drop into both eyes at bedtime.      . chlorthalidone (HYGROTON) 25 MG tablet TAKE ONE TABLET BY MOUTH EVERY DAY  30 tablet  6  . fluticasone (FLONASE) 50 MCG/ACT nasal spray Place 2 sprays into the nose as needed.       . metoprolol succinate (TOPROL-XL) 50 MG 24 hr tablet Take 1 tablet (50 mg total) by mouth daily. Take with or immediately following a meal.  30 tablet  6  . Multiple Vitamin (MULTIVITAMIN) tablet Take 1 tablet by mouth daily.      . Omega-3 Fatty Acids (FISH OIL TRIPLE STRENGTH) 1360 MG CAPS Take 1 capsule by mouth daily. 1400 mg capsule       . pantoprazole (PROTONIX) 40 MG tablet Take 1 tablet (40 mg total) by mouth daily.  30 tablet  3  . triamcinolone cream (KENALOG) 0.1 % Apply 1 application topically as needed.       . verapamil (CALAN-SR) 180 MG CR tablet Take 1 tablet (180 mg total) by mouth at  bedtime.  30 tablet  6   No current facility-administered medications for this visit.    Past Medical History  Diagnosis Date  . Pericardial effusion     Idiopathic, recurrent pericardial effusion s/p pericardial window.  . Supraventricular tachycardia   . Paroxysmal atrial fibrillation   . Temporal arteritis   . Leg fracture     Distal tibial/fibula 2003  . Prostate cancer     Seed implants 2007    Past Surgical History  Procedure Laterality Date  . Pericardial window  December 2008    Social History [name] reports that he quit smoking about 52 years ago. His smoking use included Cigarettes. He has a .9 pack-year smoking history. He has never used smokeless tobacco. [name] reports that he does not drink alcohol.  Review of Systems Negative except as outlined.  Physical Examination Filed Vitals:   10/15/12 0850  BP: 117/66  Pulse: 60   Filed Weights   10/15/12 0850  Weight: 232 lb (105.235 kg)   Overweight, comfortable at rest. HEENT: Conjunctiva and lids normal, oropharynx clear. Neck: Supple, no elevated JVP or carotid bruits, no thyromegaly. Lungs: Clear to auscultation, nonlabored breathing at rest. Cardiac: Regular rate and rhythm, no S3 or significant systolic murmur, no pericardial rub. Abdomen: Soft, nontender, bowel sounds present. Extremities: No  pitting edema, distal pulses 2+.   Problem List and Plan   PERICARDIAL EFFUSION Idiopathic, recurrent status post pericardial window. Moderate size without tamponade physiology by recent echocardiogram. Symptomatically stable, continue observation for now.  Paroxysmal atrial fibrillation Noted in the past, currently in sinus rhythm. Not anticoagulated.  Essential hypertension, benign Blood pressure is normal today.    Jonelle Sidle, M.D., F.A.C.C.

## 2012-10-15 NOTE — Assessment & Plan Note (Signed)
Blood pressure is normal today. 

## 2012-10-15 NOTE — Assessment & Plan Note (Signed)
Idiopathic, recurrent status post pericardial window. Moderate size without tamponade physiology by recent echocardiogram. Symptomatically stable, continue observation for now.

## 2012-10-15 NOTE — Patient Instructions (Addendum)

## 2012-11-22 DIAGNOSIS — J309 Allergic rhinitis, unspecified: Secondary | ICD-10-CM | POA: Diagnosis not present

## 2012-11-22 DIAGNOSIS — J069 Acute upper respiratory infection, unspecified: Secondary | ICD-10-CM | POA: Diagnosis not present

## 2012-12-30 DIAGNOSIS — M1990 Unspecified osteoarthritis, unspecified site: Secondary | ICD-10-CM | POA: Diagnosis not present

## 2012-12-30 DIAGNOSIS — I10 Essential (primary) hypertension: Secondary | ICD-10-CM | POA: Diagnosis not present

## 2012-12-30 DIAGNOSIS — I4891 Unspecified atrial fibrillation: Secondary | ICD-10-CM | POA: Diagnosis not present

## 2012-12-30 DIAGNOSIS — C61 Malignant neoplasm of prostate: Secondary | ICD-10-CM | POA: Diagnosis not present

## 2012-12-30 DIAGNOSIS — E782 Mixed hyperlipidemia: Secondary | ICD-10-CM | POA: Diagnosis not present

## 2012-12-30 DIAGNOSIS — M109 Gout, unspecified: Secondary | ICD-10-CM | POA: Diagnosis not present

## 2013-01-06 DIAGNOSIS — J309 Allergic rhinitis, unspecified: Secondary | ICD-10-CM | POA: Diagnosis not present

## 2013-01-06 DIAGNOSIS — Z23 Encounter for immunization: Secondary | ICD-10-CM | POA: Diagnosis not present

## 2013-01-06 DIAGNOSIS — G2589 Other specified extrapyramidal and movement disorders: Secondary | ICD-10-CM | POA: Diagnosis not present

## 2013-01-06 DIAGNOSIS — M1990 Unspecified osteoarthritis, unspecified site: Secondary | ICD-10-CM | POA: Diagnosis not present

## 2013-01-06 DIAGNOSIS — E782 Mixed hyperlipidemia: Secondary | ICD-10-CM | POA: Diagnosis not present

## 2013-01-06 DIAGNOSIS — C61 Malignant neoplasm of prostate: Secondary | ICD-10-CM | POA: Diagnosis not present

## 2013-01-06 DIAGNOSIS — M109 Gout, unspecified: Secondary | ICD-10-CM | POA: Diagnosis not present

## 2013-01-06 DIAGNOSIS — I10 Essential (primary) hypertension: Secondary | ICD-10-CM | POA: Diagnosis not present

## 2013-01-31 DIAGNOSIS — R918 Other nonspecific abnormal finding of lung field: Secondary | ICD-10-CM | POA: Diagnosis not present

## 2013-01-31 DIAGNOSIS — R300 Dysuria: Secondary | ICD-10-CM | POA: Diagnosis not present

## 2013-01-31 DIAGNOSIS — R1011 Right upper quadrant pain: Secondary | ICD-10-CM | POA: Diagnosis not present

## 2013-01-31 DIAGNOSIS — Z1331 Encounter for screening for depression: Secondary | ICD-10-CM | POA: Diagnosis not present

## 2013-01-31 DIAGNOSIS — K7689 Other specified diseases of liver: Secondary | ICD-10-CM | POA: Diagnosis not present

## 2013-01-31 DIAGNOSIS — N4283 Cyst of prostate: Secondary | ICD-10-CM | POA: Diagnosis not present

## 2013-02-01 ENCOUNTER — Other Ambulatory Visit: Payer: Self-pay | Admitting: Physician Assistant

## 2013-02-03 DIAGNOSIS — R1011 Right upper quadrant pain: Secondary | ICD-10-CM | POA: Diagnosis not present

## 2013-02-03 DIAGNOSIS — R300 Dysuria: Secondary | ICD-10-CM | POA: Diagnosis not present

## 2013-02-03 DIAGNOSIS — B0223 Postherpetic polyneuropathy: Secondary | ICD-10-CM | POA: Diagnosis not present

## 2013-02-27 DIAGNOSIS — Z1211 Encounter for screening for malignant neoplasm of colon: Secondary | ICD-10-CM | POA: Diagnosis not present

## 2013-03-11 DIAGNOSIS — Z79899 Other long term (current) drug therapy: Secondary | ICD-10-CM | POA: Diagnosis not present

## 2013-03-11 DIAGNOSIS — M109 Gout, unspecified: Secondary | ICD-10-CM | POA: Diagnosis not present

## 2013-03-11 DIAGNOSIS — Z7982 Long term (current) use of aspirin: Secondary | ICD-10-CM | POA: Diagnosis not present

## 2013-03-11 DIAGNOSIS — J449 Chronic obstructive pulmonary disease, unspecified: Secondary | ICD-10-CM | POA: Diagnosis not present

## 2013-03-11 DIAGNOSIS — Z1211 Encounter for screening for malignant neoplasm of colon: Secondary | ICD-10-CM | POA: Diagnosis not present

## 2013-03-11 DIAGNOSIS — I519 Heart disease, unspecified: Secondary | ICD-10-CM | POA: Diagnosis not present

## 2013-03-19 ENCOUNTER — Other Ambulatory Visit: Payer: Self-pay | Admitting: Physician Assistant

## 2013-04-21 DIAGNOSIS — J019 Acute sinusitis, unspecified: Secondary | ICD-10-CM | POA: Diagnosis not present

## 2013-04-21 DIAGNOSIS — E782 Mixed hyperlipidemia: Secondary | ICD-10-CM | POA: Diagnosis not present

## 2013-04-21 DIAGNOSIS — J45901 Unspecified asthma with (acute) exacerbation: Secondary | ICD-10-CM | POA: Diagnosis not present

## 2013-04-21 DIAGNOSIS — I10 Essential (primary) hypertension: Secondary | ICD-10-CM | POA: Diagnosis not present

## 2013-04-25 ENCOUNTER — Ambulatory Visit (INDEPENDENT_AMBULATORY_CARE_PROVIDER_SITE_OTHER): Payer: Medicare Other | Admitting: Cardiology

## 2013-04-25 ENCOUNTER — Encounter: Payer: Self-pay | Admitting: Cardiology

## 2013-04-25 VITALS — BP 127/68 | HR 63 | Ht 72.0 in | Wt 232.0 lb

## 2013-04-25 DIAGNOSIS — C61 Malignant neoplasm of prostate: Secondary | ICD-10-CM | POA: Diagnosis not present

## 2013-04-25 DIAGNOSIS — I3139 Other pericardial effusion (noninflammatory): Secondary | ICD-10-CM

## 2013-04-25 DIAGNOSIS — I319 Disease of pericardium, unspecified: Secondary | ICD-10-CM

## 2013-04-25 DIAGNOSIS — I4891 Unspecified atrial fibrillation: Secondary | ICD-10-CM

## 2013-04-25 DIAGNOSIS — E782 Mixed hyperlipidemia: Secondary | ICD-10-CM | POA: Diagnosis not present

## 2013-04-25 DIAGNOSIS — I10 Essential (primary) hypertension: Secondary | ICD-10-CM | POA: Diagnosis not present

## 2013-04-25 DIAGNOSIS — J309 Allergic rhinitis, unspecified: Secondary | ICD-10-CM | POA: Diagnosis not present

## 2013-04-25 DIAGNOSIS — G2589 Other specified extrapyramidal and movement disorders: Secondary | ICD-10-CM | POA: Diagnosis not present

## 2013-04-25 DIAGNOSIS — I480 Paroxysmal atrial fibrillation: Secondary | ICD-10-CM

## 2013-04-25 DIAGNOSIS — K5730 Diverticulosis of large intestine without perforation or abscess without bleeding: Secondary | ICD-10-CM | POA: Diagnosis not present

## 2013-04-25 DIAGNOSIS — I313 Pericardial effusion (noninflammatory): Secondary | ICD-10-CM

## 2013-04-25 DIAGNOSIS — M109 Gout, unspecified: Secondary | ICD-10-CM | POA: Diagnosis not present

## 2013-04-25 DIAGNOSIS — M1990 Unspecified osteoarthritis, unspecified site: Secondary | ICD-10-CM | POA: Diagnosis not present

## 2013-04-25 NOTE — Assessment & Plan Note (Signed)
Reported history, no obvious recurrences. He is not anticoagulated.

## 2013-04-25 NOTE — Patient Instructions (Signed)
Your physician wants you to follow-up in: 6 months You will receive a reminder letter in the mail two months in advance. If you don't receive a letter, please call our office to schedule the follow-up appointment.  Your physician has requested that you have an echocardiogram. Echocardiography is a painless test that uses sound waves to create images of your heart. It provides your doctor with information about the size and shape of your heart and how well your heart's chambers and valves are working. This procedure takes approximately one hour. There are no restrictions for this procedure. AT THE TIME OF YOUR 6 MONTH FOLLOW UP

## 2013-04-25 NOTE — Assessment & Plan Note (Signed)
Symptomatically stable, normal blood pressure and heart rate. Moderate sized by last echocardiogram. Followup study to be obtained for 6 months with clinical visit.

## 2013-04-25 NOTE — Assessment & Plan Note (Signed)
Good blood pressure control today.

## 2013-04-25 NOTE — Progress Notes (Signed)
Clinical Summary Mr. Spira is a 73 y.o.male last seen in July 2014. He indicates that he has been doing well, no progressive shortness of breath, palpitations, dizziness, or syncope. He remains active working on Equities trader.  Echocardiogram in July 2014 showed moderate LVH with LVEF 08-67%, grade 1 diastolic dysfunction with increased filling pressures, moderate left atrial enlargement, mild right atrial enlargement, mildly dilated right ventricle, moderate pericardial effusion without tamponade physiology. He has a history of recurrent, idiopathic pericardial effusion status post previous pericardial window in 2008.   Blood pressure and heart rate are good today. Examination is stable.   Allergies  Allergen Reactions  . Amiodarone Hcl     REACTION: rash    Current Outpatient Prescriptions  Medication Sig Dispense Refill  . allopurinol (ZYLOPRIM) 300 MG tablet Take 450 mg by mouth daily.       Marland Kitchen aspirin 81 MG tablet Take 81 mg by mouth daily.      . benazepril (LOTENSIN) 20 MG tablet Take 20 mg by mouth daily.        . bimatoprost (LUMIGAN) 0.03 % ophthalmic solution Place 1 drop into both eyes at bedtime.      . chlorthalidone (HYGROTON) 25 MG tablet TAKE ONE TABLET BY MOUTH ONCE DAILY  30 tablet  1  . fluticasone (FLONASE) 50 MCG/ACT nasal spray Place 2 sprays into the nose as needed.       Marland Kitchen levofloxacin (LEVAQUIN) 750 MG tablet       . metoprolol succinate (TOPROL-XL) 50 MG 24 hr tablet TAKE ONE TABLET BY MOUTH EVERY DAY  30 tablet  6  . Multiple Vitamin (MULTIVITAMIN) tablet Take 1 tablet by mouth daily.      . pantoprazole (PROTONIX) 40 MG tablet TAKE ONE TABLET BY MOUTH ONCE DAILY  30 tablet  6  . triamcinolone cream (KENALOG) 0.1 % Apply 1 application topically as needed.       . valACYclovir (VALTREX) 1000 MG tablet       . verapamil (CALAN-SR) 180 MG CR tablet Take 240 mg by mouth at bedtime.       No current facility-administered medications for this visit.     Past Medical History  Diagnosis Date  . Pericardial effusion     Idiopathic, recurrent pericardial effusion s/p pericardial window.  . Supraventricular tachycardia   . Paroxysmal atrial fibrillation   . Temporal arteritis   . Leg fracture     Distal tibial/fibula 2003  . Prostate cancer     Seed implants 2007    Social History Mr. Renne reports that he quit smoking about 53 years ago. His smoking use included Cigarettes. He has a .9 pack-year smoking history. He has never used smokeless tobacco. Mr. Mawson reports that he does not drink alcohol.  Review of Systems Recent sinus infection. Negative except as outlined.  Physical Examination Filed Vitals:   04/25/13 0807  BP: 127/68  Pulse: 63   Filed Weights   04/25/13 0807  Weight: 232 lb (105.235 kg)    Overweight, comfortable at rest.  HEENT: Conjunctiva and lids normal, oropharynx clear.  Neck: Supple, no elevated JVP or carotid bruits, no thyromegaly.  Lungs: Clear to auscultation, nonlabored breathing at rest.  Cardiac: Regular rate and rhythm, no S3 or significant systolic murmur, no pericardial rub.  Abdomen: Soft, nontender, bowel sounds present.  Extremities: No pitting edema, distal pulses 2+. Skin: Warm and dry. Musculoskeletal: No kyphosis. Neuropsychiatric: Alert and oriented x3, affect appropriate.   Problem  List and Plan   PERICARDIAL EFFUSION Symptomatically stable, normal blood pressure and heart rate. Moderate sized by last echocardiogram. Followup study to be obtained for 6 months with clinical visit.  Paroxysmal atrial fibrillation Reported history, no obvious recurrences. He is not anticoagulated.  Essential hypertension, benign Good blood pressure control today.    Satira Sark, M.D., F.A.C.C.

## 2013-05-03 DIAGNOSIS — K5289 Other specified noninfective gastroenteritis and colitis: Secondary | ICD-10-CM | POA: Diagnosis not present

## 2013-05-29 ENCOUNTER — Other Ambulatory Visit: Payer: Self-pay | Admitting: *Deleted

## 2013-05-29 MED ORDER — CHLORTHALIDONE 25 MG PO TABS: 25.0000 mg | ORAL_TABLET | Freq: Every day | ORAL | Status: DC

## 2013-07-01 DIAGNOSIS — H40019 Open angle with borderline findings, low risk, unspecified eye: Secondary | ICD-10-CM | POA: Diagnosis not present

## 2013-07-01 DIAGNOSIS — H2510 Age-related nuclear cataract, unspecified eye: Secondary | ICD-10-CM | POA: Diagnosis not present

## 2013-07-01 DIAGNOSIS — H524 Presbyopia: Secondary | ICD-10-CM | POA: Diagnosis not present

## 2013-07-01 DIAGNOSIS — H43399 Other vitreous opacities, unspecified eye: Secondary | ICD-10-CM | POA: Diagnosis not present

## 2013-07-02 DIAGNOSIS — M1990 Unspecified osteoarthritis, unspecified site: Secondary | ICD-10-CM | POA: Diagnosis not present

## 2013-07-11 DIAGNOSIS — H40019 Open angle with borderline findings, low risk, unspecified eye: Secondary | ICD-10-CM | POA: Diagnosis not present

## 2013-07-21 DIAGNOSIS — R05 Cough: Secondary | ICD-10-CM | POA: Diagnosis not present

## 2013-07-21 DIAGNOSIS — R059 Cough, unspecified: Secondary | ICD-10-CM | POA: Diagnosis not present

## 2013-07-21 DIAGNOSIS — R079 Chest pain, unspecified: Secondary | ICD-10-CM | POA: Diagnosis not present

## 2013-07-21 DIAGNOSIS — I10 Essential (primary) hypertension: Secondary | ICD-10-CM | POA: Diagnosis not present

## 2013-07-21 DIAGNOSIS — R002 Palpitations: Secondary | ICD-10-CM | POA: Diagnosis not present

## 2013-07-22 ENCOUNTER — Ambulatory Visit (INDEPENDENT_AMBULATORY_CARE_PROVIDER_SITE_OTHER): Payer: Medicare Other | Admitting: *Deleted

## 2013-07-22 ENCOUNTER — Telehealth: Payer: Self-pay | Admitting: *Deleted

## 2013-07-22 DIAGNOSIS — I319 Disease of pericardium, unspecified: Secondary | ICD-10-CM | POA: Diagnosis not present

## 2013-07-22 DIAGNOSIS — I313 Pericardial effusion (noninflammatory): Secondary | ICD-10-CM

## 2013-07-22 DIAGNOSIS — I3139 Other pericardial effusion (noninflammatory): Secondary | ICD-10-CM

## 2013-07-22 DIAGNOSIS — I4891 Unspecified atrial fibrillation: Secondary | ICD-10-CM

## 2013-07-22 DIAGNOSIS — R002 Palpitations: Secondary | ICD-10-CM | POA: Diagnosis not present

## 2013-07-22 DIAGNOSIS — I480 Paroxysmal atrial fibrillation: Secondary | ICD-10-CM

## 2013-07-22 NOTE — Telephone Encounter (Signed)
Addressed with patient at nurse visit.

## 2013-07-22 NOTE — Telephone Encounter (Signed)
Wife notified, he will be here this afternoon.

## 2013-07-22 NOTE — Progress Notes (Signed)
ECG reviewed, atrial fibrillation present with controlled ventricular response. He has a prior history of PAF, was in sinus rhythm by last ECG. Doubt that this explains his chest soreness however. He does have history of moderate pericardial effusion with previous window. Go ahead and proceed with a followup echocardiogram prior to his next visit. Would prefer not to anticoagulate him as yet pending further evaluation.

## 2013-07-22 NOTE — Telephone Encounter (Signed)
Patient called office c/o soreness in chest x 4-5 days.  Felt like he was out of rhythm on Sunday.  Now having a lot of weakness.  Did see his PMD yesterday.  No EKG was obtained as their machine was not working.  Labs normal (see scanned in).  Also, did chest x-ray - no results on this yet.  No c/o SOB or dizziness.  BP this morning - 136/62  HR - 74.  Patient informed message will be sent to provider for advice.  Looks like he is due for recall this July for 6 month follow up with Echo just prior to [place].

## 2013-07-22 NOTE — Telephone Encounter (Signed)
Patient saw his primary M.D. yesterday, heart rate listed in the 70s , no ECG obtained. Please have him stop by the office for an ECG to assess rhythm.

## 2013-07-22 NOTE — Progress Notes (Signed)
Patient in office for EKG per Dr. Domenic Polite.  See previous telephone note.    EKG reviewed by SM - did show atrial fib which is known.  Patient does continue to c/o soreness in chest today.   Per MD order - Echo to reassess pericardial effusion.  No medication changes currently.  Patient verbalized understanding.

## 2013-07-25 ENCOUNTER — Ambulatory Visit (HOSPITAL_COMMUNITY)
Admission: RE | Admit: 2013-07-25 | Discharge: 2013-07-25 | Disposition: A | Discharge status: Home / Self Care | Payer: Medicare Other | Source: Ambulatory Visit | Attending: Cardiology | Admitting: Cardiology

## 2013-07-25 DIAGNOSIS — I4891 Unspecified atrial fibrillation: Secondary | ICD-10-CM | POA: Diagnosis not present

## 2013-07-25 DIAGNOSIS — I10 Essential (primary) hypertension: Secondary | ICD-10-CM | POA: Diagnosis not present

## 2013-07-25 DIAGNOSIS — Z6831 Body mass index (BMI) 31.0-31.9, adult: Secondary | ICD-10-CM | POA: Insufficient documentation

## 2013-07-25 DIAGNOSIS — I313 Pericardial effusion (noninflammatory): Secondary | ICD-10-CM

## 2013-07-25 DIAGNOSIS — I3139 Other pericardial effusion (noninflammatory): Secondary | ICD-10-CM

## 2013-07-25 DIAGNOSIS — I319 Disease of pericardium, unspecified: Secondary | ICD-10-CM

## 2013-07-25 NOTE — Progress Notes (Signed)
  Echocardiogram 2D Echocardiogram has been performed.  [name] [name] 07/25/2013, 9:27 AM

## 2013-08-01 ENCOUNTER — Telehealth: Payer: Self-pay | Admitting: *Deleted

## 2013-08-01 NOTE — Telephone Encounter (Signed)
Notes Recorded by Laurine Blazer, LPN on 06/28/345 at 4:25 PM Patient notified and verbalized understanding. Has follow up scheduled for 11/03/2013 with Dr. Domenic Polite.

## 2013-08-01 NOTE — Telephone Encounter (Signed)
Message copied by Laurine Blazer on Fri Aug 01, 2013 11:46 AM ------      Message from: MCDOWELL, Aloha Gell      Created: Thu Jul 31, 2013  7:27 AM       As long as he remains symptomatically stable, that visit should be OK. Effusion is stable overall. ------

## 2013-08-01 NOTE — Telephone Encounter (Signed)
Notes Recorded by Laurine Blazer, LPN on 07/02/2498 at 37:04 AM Left message to return call. ------ Notes Recorded by Satira Sark, MD on 07/31/2013 at 7:27 AM As long as he remains symptomatically stable, that visit should be OK. Effusion is stable overall. ------ Notes Recorded by Laurine Blazer, LPN on 11/02/8914 at 9:45 PM Next visit scheduled for August. Is this okay? ------ Notes Recorded by Satira Sark, MD on 07/25/2013 at 11:42 AM I reviewed the study. There is moderate basal septal hypertrophy, LVEF approximately 65%. Circumferential pericardial effusion noted, moderate anteriorly, larger collection posterolaterally of 2.2-2.5 cm. There is very mild late diastolic right atrial compression, no RV compromise, mitral inflow pattern does not vary by 25% with respirations. IVC is normal. No convincing evidence of hemodynamic compromise. Can discuss further at his office visit.

## 2013-08-01 NOTE — Telephone Encounter (Signed)
Returning call will be home for about 40 minutes

## 2013-08-19 DIAGNOSIS — E782 Mixed hyperlipidemia: Secondary | ICD-10-CM | POA: Diagnosis not present

## 2013-08-19 DIAGNOSIS — C61 Malignant neoplasm of prostate: Secondary | ICD-10-CM | POA: Diagnosis not present

## 2013-08-19 DIAGNOSIS — I4891 Unspecified atrial fibrillation: Secondary | ICD-10-CM | POA: Diagnosis not present

## 2013-08-19 DIAGNOSIS — I10 Essential (primary) hypertension: Secondary | ICD-10-CM | POA: Diagnosis not present

## 2013-08-25 DIAGNOSIS — Z8546 Personal history of malignant neoplasm of prostate: Secondary | ICD-10-CM | POA: Diagnosis not present

## 2013-08-25 DIAGNOSIS — N390 Urinary tract infection, site not specified: Secondary | ICD-10-CM | POA: Diagnosis not present

## 2013-08-26 DIAGNOSIS — M109 Gout, unspecified: Secondary | ICD-10-CM | POA: Diagnosis not present

## 2013-08-26 DIAGNOSIS — M1990 Unspecified osteoarthritis, unspecified site: Secondary | ICD-10-CM | POA: Diagnosis not present

## 2013-08-26 DIAGNOSIS — E782 Mixed hyperlipidemia: Secondary | ICD-10-CM | POA: Diagnosis not present

## 2013-08-26 DIAGNOSIS — J309 Allergic rhinitis, unspecified: Secondary | ICD-10-CM | POA: Diagnosis not present

## 2013-08-26 DIAGNOSIS — C61 Malignant neoplasm of prostate: Secondary | ICD-10-CM | POA: Diagnosis not present

## 2013-08-26 DIAGNOSIS — G2589 Other specified extrapyramidal and movement disorders: Secondary | ICD-10-CM | POA: Diagnosis not present

## 2013-08-26 DIAGNOSIS — I10 Essential (primary) hypertension: Secondary | ICD-10-CM | POA: Diagnosis not present

## 2013-08-26 DIAGNOSIS — K5730 Diverticulosis of large intestine without perforation or abscess without bleeding: Secondary | ICD-10-CM | POA: Diagnosis not present

## 2013-09-08 ENCOUNTER — Other Ambulatory Visit: Payer: Self-pay | Admitting: *Deleted

## 2013-09-08 MED ORDER — METOPROLOL SUCCINATE ER 50 MG PO TB24: 50.0000 mg | ORAL_TABLET | Freq: Every day | ORAL | Status: DC

## 2013-09-08 MED ORDER — PANTOPRAZOLE SODIUM 40 MG PO TBEC: 40.0000 mg | DELAYED_RELEASE_TABLET | Freq: Every day | ORAL | Status: DC

## 2013-09-11 DIAGNOSIS — H40019 Open angle with borderline findings, low risk, unspecified eye: Secondary | ICD-10-CM | POA: Diagnosis not present

## 2013-10-13 DIAGNOSIS — L600 Ingrowing nail: Secondary | ICD-10-CM | POA: Diagnosis not present

## 2013-11-03 ENCOUNTER — Encounter: Payer: Self-pay | Admitting: Cardiology

## 2013-11-03 ENCOUNTER — Ambulatory Visit (INDEPENDENT_AMBULATORY_CARE_PROVIDER_SITE_OTHER): Payer: Medicare Other | Admitting: Cardiology

## 2013-11-03 VITALS — BP 146/64 | HR 71 | Ht 72.0 in | Wt 234.0 lb

## 2013-11-03 DIAGNOSIS — I319 Disease of pericardium, unspecified: Secondary | ICD-10-CM | POA: Diagnosis not present

## 2013-11-03 DIAGNOSIS — I480 Paroxysmal atrial fibrillation: Secondary | ICD-10-CM

## 2013-11-03 DIAGNOSIS — I10 Essential (primary) hypertension: Secondary | ICD-10-CM | POA: Diagnosis not present

## 2013-11-03 DIAGNOSIS — I4891 Unspecified atrial fibrillation: Secondary | ICD-10-CM

## 2013-11-03 NOTE — Assessment & Plan Note (Signed)
Based on history, no obvious recurrences. He is not anticoagulated.

## 2013-11-03 NOTE — Assessment & Plan Note (Signed)
Moderate to large without clear evidence of tamponade physiology by most recent echocardiogram. This is recurrent following pericardial window in December 2008. He is symptomatically stable, will continue observation for now. Followup in 6 months with a limited echocardiogram to reassess pericardial effusion.

## 2013-11-03 NOTE — Patient Instructions (Signed)
Your physician recommends that you schedule a follow-up appointment in: 6 months. You will receive a reminder letter in the mail in about 4 months reminding you to call and schedule your appointment. If you don't receive this letter, please contact our office. Your physician recommends that you continue on your current medications as directed. Please refer to the Current Medication list given to you today. Your physician has requested that you have a limited echocardiogram in 6 months just before your next visit. A limited echocardiography is a painless test that uses sound waves to create images of your heart. It provides your doctor with information about the size and shape of your heart and how well your heart's chambers and valves are working. This procedure takes approximately one hour. There are no restrictions for this procedure.

## 2013-11-03 NOTE — Progress Notes (Signed)
Clinical Summary Mr. Castell is a 73 y.o.male last seen in January. He reports no change in functional status since that time, specifically no exertional chest pain or shortness of breath beyond NYHA class II. No palpitations, dizziness or syncope.  Followup echocardiogram in May showed moderate basal septal hypertrophy with LVEF of 83-38%, grade 1 diastolic dysfunction, aortic valve sclerosis without stenosis, moderate left atrial enlargement, moderate anterior pericardial effusion with larger collection posterolaterally (2.2-2.5 cm), very mild late diastolic right atrial compression with no RV compromise, no clear evidence of Denied by respiratory variation in mitral inflow pattern and normal IVC.  Lab work from April showed hemoglobin 12.6, platelets 207, BUN 23, creatinine 1.0, potassium 3.7, normal LFTs.  Allergies  Allergen Reactions  . Amiodarone Hcl     REACTION: rash    Current Outpatient Prescriptions  Medication Sig Dispense Refill  . allopurinol (ZYLOPRIM) 300 MG tablet Take 450 mg by mouth daily.       Marland Kitchen aspirin 81 MG tablet Take 81 mg by mouth daily.      . benazepril (LOTENSIN) 20 MG tablet Take 20 mg by mouth daily.        . chlorthalidone (HYGROTON) 25 MG tablet Take 1 tablet (25 mg total) by mouth daily.  30 tablet  6  . fluticasone (FLONASE) 50 MCG/ACT nasal spray Place 2 sprays into the nose as needed.       . metoprolol succinate (TOPROL-XL) 50 MG 24 hr tablet Take 1 tablet (50 mg total) by mouth daily. Take with or immediately following a meal.  30 tablet  6  . Multiple Vitamin (MULTIVITAMIN) tablet Take 1 tablet by mouth daily.      . pantoprazole (PROTONIX) 40 MG tablet Take 1 tablet (40 mg total) by mouth daily.  30 tablet  6  . verapamil (CALAN-SR) 180 MG CR tablet Take 240 mg by mouth at bedtime.       No current facility-administered medications for this visit.    Past Medical History  Diagnosis Date  . Pericardial effusion     Idiopathic, recurrent  pericardial effusion s/p pericardial window.  . Supraventricular tachycardia   . Paroxysmal atrial fibrillation   . Temporal arteritis   . Leg fracture     Distal tibial/fibula 2003  . Prostate cancer     Seed implants 2007    Past Surgical History  Procedure Laterality Date  . Pericardial window  December 2008    Social History Mr. Bloyd reports that he quit smoking about 53 years ago. His smoking use included Cigarettes. He has a .9 pack-year smoking history. He has never used smokeless tobacco. Mr. Heslin reports that he does not drink alcohol.  Review of Systems Intermittent arthritis symptoms, no fevers or chills, stable appetite, no bleeding problems. Other systems reviewed and negative except as outlined.  Physical Examination Filed Vitals:   11/03/13 0811  BP: 146/64  Pulse: 71   Filed Weights   11/03/13 0811  Weight: 234 lb (106.142 kg)    Overweight, comfortable at rest.  HEENT: Conjunctiva and lids normal, oropharynx clear.  Neck: Supple, no elevated JVP or carotid bruits, no thyromegaly.  Lungs: Clear to auscultation, nonlabored breathing at rest.  Cardiac: Regular rate and rhythm, no S3 or significant systolic murmur, no pericardial rub.  Abdomen: Soft, nontender, bowel sounds present.  Extremities: No pitting edema, distal pulses 2+.  Skin: Warm and dry.  Musculoskeletal: No kyphosis.  Neuropsychiatric: Alert and oriented x3, affect appropriate.  Problem List and Plan   PERICARDIAL EFFUSION Moderate to large without clear evidence of tamponade physiology by most recent echocardiogram. This is recurrent following pericardial window in December 2008. He is symptomatically stable, will continue observation for now. Followup in 6 months with a limited echocardiogram to reassess pericardial effusion.  Paroxysmal atrial fibrillation Based on history, no obvious recurrences. He is not anticoagulated.  Essential hypertension, benign Blood pressure mildly  elevated today. Keep followup with Dr. [name].    Satira Sark, M.D., F.A.C.C.

## 2013-11-03 NOTE — Assessment & Plan Note (Signed)
Blood pressure mildly elevated today. Keep followup with Dr. [name].

## 2013-12-09 DIAGNOSIS — I4891 Unspecified atrial fibrillation: Secondary | ICD-10-CM | POA: Diagnosis not present

## 2013-12-09 DIAGNOSIS — I10 Essential (primary) hypertension: Secondary | ICD-10-CM | POA: Diagnosis not present

## 2013-12-09 DIAGNOSIS — E782 Mixed hyperlipidemia: Secondary | ICD-10-CM | POA: Diagnosis not present

## 2013-12-19 DIAGNOSIS — I10 Essential (primary) hypertension: Secondary | ICD-10-CM | POA: Diagnosis not present

## 2013-12-19 DIAGNOSIS — I4892 Unspecified atrial flutter: Secondary | ICD-10-CM | POA: Diagnosis not present

## 2013-12-19 DIAGNOSIS — E782 Mixed hyperlipidemia: Secondary | ICD-10-CM | POA: Diagnosis not present

## 2013-12-19 DIAGNOSIS — Z23 Encounter for immunization: Secondary | ICD-10-CM | POA: Diagnosis not present

## 2013-12-19 DIAGNOSIS — G2589 Other specified extrapyramidal and movement disorders: Secondary | ICD-10-CM | POA: Diagnosis not present

## 2013-12-19 DIAGNOSIS — C61 Malignant neoplasm of prostate: Secondary | ICD-10-CM | POA: Diagnosis not present

## 2013-12-19 DIAGNOSIS — Z0000 Encounter for general adult medical examination without abnormal findings: Secondary | ICD-10-CM | POA: Diagnosis not present

## 2013-12-19 DIAGNOSIS — E119 Type 2 diabetes mellitus without complications: Secondary | ICD-10-CM | POA: Diagnosis not present

## 2013-12-19 DIAGNOSIS — Z789 Other specified health status: Secondary | ICD-10-CM | POA: Diagnosis not present

## 2013-12-19 DIAGNOSIS — M316 Other giant cell arteritis: Secondary | ICD-10-CM | POA: Diagnosis not present

## 2013-12-29 DIAGNOSIS — I10 Essential (primary) hypertension: Secondary | ICD-10-CM | POA: Diagnosis present

## 2013-12-29 DIAGNOSIS — R21 Rash and other nonspecific skin eruption: Secondary | ICD-10-CM | POA: Diagnosis present

## 2013-12-29 DIAGNOSIS — I313 Pericardial effusion (noninflammatory): Secondary | ICD-10-CM | POA: Diagnosis not present

## 2013-12-29 DIAGNOSIS — Z801 Family history of malignant neoplasm of trachea, bronchus and lung: Secondary | ICD-10-CM

## 2013-12-29 DIAGNOSIS — Z8042 Family history of malignant neoplasm of prostate: Secondary | ICD-10-CM | POA: Diagnosis not present

## 2013-12-29 DIAGNOSIS — Z8546 Personal history of malignant neoplasm of prostate: Secondary | ICD-10-CM

## 2013-12-29 DIAGNOSIS — E876 Hypokalemia: Secondary | ICD-10-CM | POA: Diagnosis not present

## 2013-12-29 DIAGNOSIS — D72829 Elevated white blood cell count, unspecified: Secondary | ICD-10-CM | POA: Diagnosis not present

## 2013-12-29 DIAGNOSIS — R000 Tachycardia, unspecified: Secondary | ICD-10-CM | POA: Diagnosis not present

## 2013-12-29 DIAGNOSIS — I319 Disease of pericardium, unspecified: Secondary | ICD-10-CM | POA: Diagnosis not present

## 2013-12-29 DIAGNOSIS — R0989 Other specified symptoms and signs involving the circulatory and respiratory systems: Secondary | ICD-10-CM | POA: Diagnosis not present

## 2013-12-29 DIAGNOSIS — Z87891 Personal history of nicotine dependence: Secondary | ICD-10-CM

## 2013-12-29 DIAGNOSIS — I480 Paroxysmal atrial fibrillation: Principal | ICD-10-CM | POA: Diagnosis present

## 2013-12-29 DIAGNOSIS — I4891 Unspecified atrial fibrillation: Secondary | ICD-10-CM | POA: Diagnosis not present

## 2013-12-29 DIAGNOSIS — Z8249 Family history of ischemic heart disease and other diseases of the circulatory system: Secondary | ICD-10-CM | POA: Diagnosis not present

## 2013-12-29 DIAGNOSIS — R0789 Other chest pain: Secondary | ICD-10-CM | POA: Diagnosis not present

## 2013-12-29 DIAGNOSIS — R739 Hyperglycemia, unspecified: Secondary | ICD-10-CM | POA: Diagnosis present

## 2013-12-29 DIAGNOSIS — R002 Palpitations: Secondary | ICD-10-CM | POA: Diagnosis not present

## 2013-12-29 DIAGNOSIS — C61 Malignant neoplasm of prostate: Secondary | ICD-10-CM | POA: Diagnosis not present

## 2013-12-30 ENCOUNTER — Inpatient Hospital Stay (HOSPITAL_COMMUNITY)
Admission: EM | Admit: 2013-12-30 | Discharge: 2014-01-01 | DRG: 309 | Disposition: A | Discharge status: Home / Self Care | Payer: Medicare Other | Attending: Internal Medicine | Admitting: Internal Medicine

## 2013-12-30 ENCOUNTER — Encounter (HOSPITAL_COMMUNITY): Payer: Self-pay | Admitting: Emergency Medicine

## 2013-12-30 ENCOUNTER — Emergency Department (HOSPITAL_COMMUNITY): Payer: Medicare Other

## 2013-12-30 DIAGNOSIS — D72829 Elevated white blood cell count, unspecified: Secondary | ICD-10-CM

## 2013-12-30 DIAGNOSIS — Z8042 Family history of malignant neoplasm of prostate: Secondary | ICD-10-CM | POA: Diagnosis not present

## 2013-12-30 DIAGNOSIS — I480 Paroxysmal atrial fibrillation: Secondary | ICD-10-CM | POA: Diagnosis not present

## 2013-12-30 DIAGNOSIS — I471 Supraventricular tachycardia: Secondary | ICD-10-CM

## 2013-12-30 DIAGNOSIS — I319 Disease of pericardium, unspecified: Secondary | ICD-10-CM

## 2013-12-30 DIAGNOSIS — I4891 Unspecified atrial fibrillation: Secondary | ICD-10-CM

## 2013-12-30 DIAGNOSIS — I3139 Other pericardial effusion (noninflammatory): Secondary | ICD-10-CM

## 2013-12-30 DIAGNOSIS — I10 Essential (primary) hypertension: Secondary | ICD-10-CM

## 2013-12-30 DIAGNOSIS — R0989 Other specified symptoms and signs involving the circulatory and respiratory systems: Secondary | ICD-10-CM | POA: Diagnosis not present

## 2013-12-30 DIAGNOSIS — R21 Rash and other nonspecific skin eruption: Secondary | ICD-10-CM | POA: Diagnosis present

## 2013-12-30 DIAGNOSIS — R000 Tachycardia, unspecified: Secondary | ICD-10-CM | POA: Diagnosis not present

## 2013-12-30 DIAGNOSIS — Z8546 Personal history of malignant neoplasm of prostate: Secondary | ICD-10-CM | POA: Diagnosis not present

## 2013-12-30 DIAGNOSIS — C61 Malignant neoplasm of prostate: Secondary | ICD-10-CM | POA: Diagnosis not present

## 2013-12-30 DIAGNOSIS — Z87891 Personal history of nicotine dependence: Secondary | ICD-10-CM | POA: Diagnosis not present

## 2013-12-30 DIAGNOSIS — I313 Pericardial effusion (noninflammatory): Secondary | ICD-10-CM | POA: Diagnosis not present

## 2013-12-30 DIAGNOSIS — R739 Hyperglycemia, unspecified: Secondary | ICD-10-CM | POA: Diagnosis present

## 2013-12-30 DIAGNOSIS — E876 Hypokalemia: Secondary | ICD-10-CM | POA: Diagnosis not present

## 2013-12-30 DIAGNOSIS — Z8249 Family history of ischemic heart disease and other diseases of the circulatory system: Secondary | ICD-10-CM | POA: Diagnosis not present

## 2013-12-30 DIAGNOSIS — R0789 Other chest pain: Secondary | ICD-10-CM | POA: Diagnosis not present

## 2013-12-30 DIAGNOSIS — R002 Palpitations: Secondary | ICD-10-CM | POA: Diagnosis present

## 2013-12-30 DIAGNOSIS — Z801 Family history of malignant neoplasm of trachea, bronchus and lung: Secondary | ICD-10-CM | POA: Diagnosis not present

## 2013-12-30 LAB — MRSA PCR SCREENING: MRSA by PCR: NEGATIVE

## 2013-12-30 LAB — COMPREHENSIVE METABOLIC PANEL
ALT: 18 U/L (ref 0–53)
AST: 20 U/L (ref 0–37)
Albumin: 3.8 g/dL (ref 3.5–5.2)
Alkaline Phosphatase: 103 U/L (ref 39–117)
Anion gap: 15 (ref 5–15)
BUN: 27 mg/dL — AB (ref 6–23)
CHLORIDE: 97 meq/L (ref 96–112)
CO2: 24 mEq/L (ref 19–32)
Calcium: 9.6 mg/dL (ref 8.4–10.5)
Creatinine, Ser: 1.07 mg/dL (ref 0.50–1.35)
GFR calc Af Amer: 77 mL/min — ABNORMAL LOW (ref >90)
GFR, EST NON AFRICAN AMERICAN: 67 mL/min — AB (ref >90)
Glucose, Bld: 243 mg/dL — ABNORMAL HIGH (ref 70–99)
POTASSIUM: 3.5 meq/L — AB (ref 3.7–5.3)
Sodium: 136 mEq/L — ABNORMAL LOW (ref 137–147)
Total Bilirubin: 0.6 mg/dL (ref 0.3–1.2)
Total Protein: 7.7 g/dL (ref 6.0–8.3)

## 2013-12-30 LAB — CBC WITH DIFFERENTIAL/PLATELET
Basophils Absolute: 0.1 10*3/uL (ref 0.0–0.1)
Basophils Relative: 0 % (ref 0–1)
Eosinophils Absolute: 0.3 10*3/uL (ref 0.0–0.7)
Eosinophils Relative: 3 % (ref 0–5)
HCT: 39.5 % (ref 39.0–52.0)
HEMOGLOBIN: 13.4 g/dL (ref 13.0–17.0)
Lymphocytes Relative: 19 % (ref 12–46)
Lymphs Abs: 2.2 10*3/uL (ref 0.7–4.0)
MCH: 30 pg (ref 26.0–34.0)
MCHC: 33.9 g/dL (ref 30.0–36.0)
MCV: 88.4 fL (ref 78.0–100.0)
MONOS PCT: 11 % (ref 3–12)
Monocytes Absolute: 1.2 10*3/uL — ABNORMAL HIGH (ref 0.1–1.0)
NEUTROS ABS: 7.7 10*3/uL (ref 1.7–7.7)
Neutrophils Relative %: 67 % (ref 43–77)
Platelets: 207 10*3/uL (ref 150–400)
RBC: 4.47 MIL/uL (ref 4.22–5.81)
RDW: 14.3 % (ref 11.5–15.5)
WBC: 11.5 10*3/uL — AB (ref 4.0–10.5)

## 2013-12-30 LAB — HEMOGLOBIN A1C
HEMOGLOBIN A1C: 6.9 % — AB (ref <5.7)
Mean Plasma Glucose: 151 mg/dL — ABNORMAL HIGH (ref <117)

## 2013-12-30 LAB — TROPONIN I

## 2013-12-30 LAB — GLUCOSE, CAPILLARY
Glucose-Capillary: 159 mg/dL — ABNORMAL HIGH (ref 70–99)
Glucose-Capillary: 151 mg/dL — ABNORMAL HIGH (ref 70–99)
Glucose-Capillary: 118 mg/dL — ABNORMAL HIGH (ref 70–99)

## 2013-12-30 LAB — PROTIME-INR
INR: 1.12 (ref 0.00–1.49)
PROTHROMBIN TIME: 14.4 s (ref 11.6–15.2)

## 2013-12-30 LAB — TSH: TSH: 3.51 u[IU]/mL (ref 0.350–4.500)

## 2013-12-30 MED ORDER — PANTOPRAZOLE SODIUM 40 MG PO TBEC
40.0000 mg | DELAYED_RELEASE_TABLET | Freq: Every day | ORAL | Status: DC
  Administered 2013-12-30 – 2014-01-01 (×3): 40 mg via ORAL
  Filled 2013-12-30 (×3): qty 1

## 2013-12-30 MED ORDER — SODIUM CHLORIDE 0.9 % IJ SOLN
3.0000 mL | Freq: Two times a day (BID) | INTRAMUSCULAR | Status: DC
  Administered 2013-12-30 – 2014-01-01 (×4): 3 mL via INTRAVENOUS

## 2013-12-30 MED ORDER — SODIUM CHLORIDE 0.9 % IV SOLN: INTRAVENOUS | Status: AC

## 2013-12-30 MED ORDER — ASPIRIN 81 MG PO TABS
81.0000 mg | ORAL_TABLET | Freq: Every day | ORAL | Status: DC
  Filled 2013-12-30 (×2): qty 1

## 2013-12-30 MED ORDER — POTASSIUM CHLORIDE CRYS ER 20 MEQ PO TBCR
40.0000 meq | EXTENDED_RELEASE_TABLET | Freq: Once | ORAL | Status: AC
  Administered 2013-12-30: 40 meq via ORAL
  Filled 2013-12-30: qty 2

## 2013-12-30 MED ORDER — AMIODARONE HCL IN DEXTROSE 360-4.14 MG/200ML-% IV SOLN
60.0000 mg/h | INTRAVENOUS | Status: AC
  Administered 2013-12-30 (×2): 60 mg/h via INTRAVENOUS
  Filled 2013-12-30 (×2): qty 200

## 2013-12-30 MED ORDER — METOPROLOL SUCCINATE ER 50 MG PO TB24
50.0000 mg | ORAL_TABLET | Freq: Every day | ORAL | Status: DC
  Administered 2013-12-30 – 2013-12-31 (×2): 50 mg via ORAL
  Filled 2013-12-30 (×3): qty 1

## 2013-12-30 MED ORDER — ALLOPURINOL 300 MG PO TABS
450.0000 mg | ORAL_TABLET | Freq: Every day | ORAL | Status: DC
  Administered 2013-12-30 – 2014-01-01 (×3): 450 mg via ORAL
  Filled 2013-12-30 (×3): qty 2

## 2013-12-30 MED ORDER — DILTIAZEM LOAD VIA INFUSION
10.0000 mg | Freq: Once | INTRAVENOUS | Status: AC
  Administered 2013-12-30: 10 mg via INTRAVENOUS
  Filled 2013-12-30: qty 10

## 2013-12-30 MED ORDER — OXYCODONE-ACETAMINOPHEN 5-325 MG PO TABS
1.0000 | ORAL_TABLET | ORAL | Status: DC | PRN
Start: 2013-12-30 — End: 2014-01-01

## 2013-12-30 MED ORDER — DILTIAZEM HCL 100 MG IV SOLR
5.0000 mg/h | INTRAVENOUS | Status: DC
  Administered 2013-12-30: 5 mg/h via INTRAVENOUS
  Filled 2013-12-30: qty 100

## 2013-12-30 MED ORDER — INSULIN ASPART 100 UNIT/ML ~~LOC~~ SOLN
0.0000 [IU] | Freq: Three times a day (TID) | SUBCUTANEOUS | Status: DC
  Administered 2013-12-30 (×2): 2 [IU] via SUBCUTANEOUS
  Administered 2013-12-31 – 2014-01-01 (×4): 1 [IU] via SUBCUTANEOUS

## 2013-12-30 MED ORDER — AMIODARONE LOAD VIA INFUSION
150.0000 mg | Freq: Once | INTRAVENOUS | Status: AC
  Administered 2013-12-30: 150 mg via INTRAVENOUS
  Filled 2013-12-30: qty 83.34

## 2013-12-30 MED ORDER — DILTIAZEM HCL 30 MG PO TABS
30.0000 mg | ORAL_TABLET | Freq: Four times a day (QID) | ORAL | Status: DC
  Administered 2013-12-30 – 2013-12-31 (×4): 30 mg via ORAL
  Filled 2013-12-30 (×5): qty 1

## 2013-12-30 MED ORDER — ONDANSETRON HCL 4 MG/2ML IJ SOLN: 4.0000 mg | Freq: Four times a day (QID) | INTRAMUSCULAR | Status: DC | PRN

## 2013-12-30 MED ORDER — ACETAMINOPHEN 325 MG PO TABS
650.0000 mg | ORAL_TABLET | Freq: Four times a day (QID) | ORAL | Status: DC | PRN
Start: 2013-12-30 — End: 2014-01-01

## 2013-12-30 MED ORDER — ENOXAPARIN SODIUM 40 MG/0.4ML ~~LOC~~ SOLN
40.0000 mg | SUBCUTANEOUS | Status: DC
  Administered 2013-12-30 – 2014-01-01 (×3): 40 mg via SUBCUTANEOUS
  Filled 2013-12-30 (×3): qty 0.4

## 2013-12-30 MED ORDER — SODIUM CHLORIDE 0.9 % IV SOLN
INTRAVENOUS | Status: AC
  Administered 2013-12-30: 05:00:00 via INTRAVENOUS

## 2013-12-30 MED ORDER — ONDANSETRON HCL 4 MG PO TABS: 4.0000 mg | ORAL_TABLET | Freq: Four times a day (QID) | ORAL | Status: DC | PRN

## 2013-12-30 MED ORDER — AMIODARONE HCL IN DEXTROSE 360-4.14 MG/200ML-% IV SOLN
30.0000 mg/h | INTRAVENOUS | Status: DC
  Administered 2013-12-30: 60 mg/h via INTRAVENOUS
  Administered 2013-12-31: 30 mg/h via INTRAVENOUS
  Filled 2013-12-30: qty 200

## 2013-12-30 MED ORDER — ASPIRIN EC 81 MG PO TBEC
81.0000 mg | DELAYED_RELEASE_TABLET | Freq: Every day | ORAL | Status: DC
  Administered 2013-12-30 – 2014-01-01 (×3): 81 mg via ORAL
  Filled 2013-12-30 (×3): qty 1

## 2013-12-30 MED ORDER — BENZONATATE 100 MG PO CAPS
100.0000 mg | ORAL_CAPSULE | Freq: Three times a day (TID) | ORAL | Status: DC | PRN
  Administered 2013-12-30: 100 mg via ORAL
  Filled 2013-12-30 (×2): qty 1

## 2013-12-30 MED ORDER — DILTIAZEM HCL 100 MG IV SOLR
5.0000 mg/h | INTRAVENOUS | Status: DC
  Administered 2013-12-30: 10 mg/h via INTRAVENOUS
  Filled 2013-12-30: qty 100

## 2013-12-30 MED ORDER — MORPHINE SULFATE 2 MG/ML IJ SOLN: 1.0000 mg | INTRAMUSCULAR | Status: DC | PRN

## 2013-12-30 MED ORDER — ACETAMINOPHEN 650 MG RE SUPP: 650.0000 mg | Freq: Four times a day (QID) | RECTAL | Status: DC | PRN

## 2013-12-30 NOTE — Care Management Note (Addendum)
    Page 1 of 1   01/01/2014     2:52:42 PM CARE MANAGEMENT NOTE 01/01/2014  Patient:  [name] [name], [name] [name]   Account Number:  [id number]  Date Initiated:  12/30/2013  Documentation initiated by:  Theophilus Kinds  Subjective/Objective Assessment:   Pt admitted from home with a fib. Pt lives with his wife and will return home at discharge. Pt is independent with ADL's.     Action/Plan:   No CM needs anticipated.   Anticipated DC Date:  01/01/2014   Anticipated DC Plan:  [place]  CM consult      Choice offered to / List presented to:             Status of service:  Completed, signed off Medicare Important Message given?  YES (If response is "NO", the following Medicare IM given date fields will be blank) Date Medicare IM given:  01/01/2014 Medicare IM given by:  Vladimir Creeks Date Additional Medicare IM given:   Additional Medicare IM given by:    Discharge Disposition:  HOME/SELF CARE  Per UR Regulation:  Reviewed for med. necessity/level of care/duration of stay  If discussed at [place] of Stay Meetings, dates discussed:    Comments:  01/01/14 [place] RN/CM 12/30/13 [place], RN BSN CM

## 2013-12-30 NOTE — Consult Note (Signed)
The patient was seen and examined, and I agree with the assessment and plan as documented above, with modifications as noted below. Pt with h/o idiopathic pericardial effusion, HTN, and paroxysmal atrial fibrillation admitted with palpitations and found to be in rapid atrial fibrillation.  Takes Toprol-XL and verapamil as outpatient. CHADSVASC score of 2, not on anticoagulation. Last echo in 07/2013 demonstrated a circumferential pericardial effusion (largest posterolaterally) with very mild late diastolic right atrial compression, no RV compromise, mitral inflow pattern did not vary by 25% with respirations and IVC was normal. Hence, no convincing evidence of hemodynamic compromise.  Other than what is noted above, he has been dealing with seasonal allergies and shingles in the right infraaxillary region. He took Benadryl and this caused him to feel excited. He thinks he may be allergic to amiodarone, but is willing to try it again.  RECS: Will d/c IV diltiazem infusion and initiate short-acting oral diltiazem, along with Toprol-XL. Will also administer IV amiodarone loading and infusion. For the time being, no anticoagulation until pericardial effusion is reassessed.

## 2013-12-30 NOTE — Progress Notes (Signed)
  Echocardiogram 2D Echocardiogram has been performed.  [place], [place] 12/30/2013, 2:47 PM

## 2013-12-30 NOTE — Consult Note (Signed)
CARDIOLOGY CONSULT NOTE   Patient ID: [name] [name] MRN: [id number] DOB/AGE: 73/17/1942 73 y.o.  Admit Date: 12/30/2013 Referring Physician: PTH Primary Physician: Gar Ponto, MD Consulting Cardiologist: Kate Sable MD Primary Cardiologist: Rozann Lesches MD [facility] office) Reason for Consultation: Atrial fibrillation with RVR (hx of afib).   Clinical Summary [name] [name] is a 73 y.o.male with known history of atrial fib, (no anticoagulation) pericardial effusion requiring pericardial window in 2008, hypertension. He was in his usual state of health, when after church last evening he came home and rested. [place] had been at [place] and had not eaten dinner. He ate some crackers and rested in his chair. Felt asleep around 9:30 pm but awoke approx one hour later feeling his heart rate being rapid and irregular. He had low grade nausea. His wife, who is a CNA, took his BP and found it to be elevated in the 009'Q systolic with elevated HR, but irregular. She gave him on addition dose of metoprolol  50 mg as she had been told to give him during past episodes when HR became elevated. This did not help him and therefore they came to ER.   On arrival to ER, HR was still slightly elevated up to 130 bom, with BP 125/ 81. EKG confirmed HR of 164 bpm, with ST-T wave abnormalities in the lateral leads. He was started on diltiazem gtt at 5 mg/hr titrating up to 15 mg, and then back to 5 mg/hr,  after receiving bolus of 10 mg. Heart rate is now back to 90's but has gone up as high as 120 since admission. Review of labs demonstrate negative troponin X 2. Slight hypokalemia, leukocytosis WBC's 11.5,    He is currently feeling much better and wants to go home. He states that the day before, on Sunday, he had some sinus congestion, and burning in his throat. Took two benadryl. He states he made him "crazy" out of his head. He also has noticed mild weight gain without edema, but mild abdominal increase. No  chest pain or  DOE.     Allergies  Allergen Reactions  . Amiodarone Hcl     REACTION: rash    Medications Scheduled Medications: . sodium chloride   Intravenous STAT  . allopurinol  450 mg Oral Daily  . aspirin EC  81 mg Oral Daily  . enoxaparin (LOVENOX) injection  40 mg Subcutaneous Q24H  . insulin aspart  0-9 Units Subcutaneous TID WC  . metoprolol succinate  50 mg Oral Daily  . pantoprazole  40 mg Oral Daily  . sodium chloride  3 mL Intravenous Q12H    Infusions: . sodium chloride 75 mL/hr at 12/30/13 0523  . diltiazem (CARDIZEM) infusion 10 mg/hr (12/30/13 0616)    PRN Medications: acetaminophen, acetaminophen, morphine injection, ondansetron (ZOFRAN) IV, ondansetron   Past Medical History  Diagnosis Date  . Pericardial effusion     Idiopathic, recurrent pericardial effusion s/p pericardial window.  . Supraventricular tachycardia   . Paroxysmal atrial fibrillation   . Temporal arteritis   . Leg fracture     Distal tibial/fibula 2003  . Prostate cancer     Seed implants 2007    Past Surgical History  Procedure Laterality Date  . Pericardial window  December 2008    Family History  Problem Relation Age of Onset  . CAD Mother     MI in 27s  . Prostate cancer Father   . Prostate cancer Brother   . Lung cancer Brother  Social History [name] [name] reports that he quit smoking about 53 years ago. His smoking use included Cigarettes. He has a .9 pack-year smoking history. He has never used smokeless tobacco. [name] [name] reports that he does not drink alcohol.  Review of Systems Otherwise reviewed and negative except as outlined.  Physical Examination Blood pressure 104/55, pulse 80, temperature 98.5 F (36.9 C), temperature source Oral, resp. rate 17, height 6\' 2"  (1.88 m), weight 227 lb 8.2 oz (103.2 kg), SpO2 96.00%. No intake or output data in the 24 hours ending 12/30/13 0910  Telemetry:Atrial fib with labile rates from 80's to 115 bpm.    NWG:NFAOZHY comfortably HEENT: Conjunctiva and lids normal, oropharynx clear with moist mucosa. Neck: Supple, no elevated JVP or carotid bruits, no thyromegaly. Lungs: Clear to auscultation, nonlabored breathing at rest. Cardiac: Regular rate and rhythm, no S3 or significant systolic murmur, no pericardial rub. Abdomen: Soft, nontender, no hepatomegaly, bowel sounds present, no guarding or rebound. Extremities: No pitting edema, distal pulses 2+. Skin: Warm and dry. Musculoskeletal: No kyphosis. Neuropsychiatric: Alert and oriented x3, affect grossly appropriate.  Prior Cardiac Testing/Procedures 1.Echocardiogram 5.1.2015 Left ventricle: The cavity size was normal. Wall thickness was increased in a pattern of severe LVH. Systolic function was vigorous. The estimated ejection fraction was in the range of 65% to 70%. There was an increased relative contribution of atrial contraction to ventricular filling. Doppler parameters are consistent with abnormal left ventricular relaxation (grade 1 diastolic dysfunction). Doppler parameters are consistent with high ventricular filling pressure. - Aortic valve: Mildly to moderately calcified annulus. Mildly thickened leaflets. - Mitral valve: Calcified annulus. Moderately thickened leaflets . - Left atrium: The atrium was moderately dilated. - Pericardium, extracardiac: There is a large size circumferential pericardial effusion, measuring 2.52 cm in maximum diameter. There is a mild degree of right atrial free wall impingement, but no frank collapse of either the right atrium or ventricle. There is currently no evidence of tamponade physiology.  Pericardiocentesis 07/27/2010 CONCLUSION:  1. Pericardial tamponade.  2. Pericardiocentesis with removal of 1300 mL of yellow fluid with a  fall in the right atrial pressure from 16 to 10, a fall in the  pericardial pressure from 16 to 0 and improvement in the pulmonary  artery saturation from  67% to 79%.  Lab Results  Basic Metabolic Panel:  Recent Labs Lab 12/30/13 0014  NA 136*  K 3.5*  CL 97  CO2 24  GLUCOSE 243*  BUN 27*  CREATININE 1.07  CALCIUM 9.6    Liver Function Tests:  Recent Labs Lab 12/30/13 0014  AST 20  ALT 18  ALKPHOS 103  BILITOT 0.6  PROT 7.7  ALBUMIN 3.8    CBC:  Recent Labs Lab 12/30/13 0014  WBC 11.5*  NEUTROABS 7.7  HGB 13.4  HCT 39.5  MCV 88.4  PLT 207    Cardiac Enzymes:  Recent Labs Lab 12/30/13 0014 12/30/13 0530  TROPONINI <0.30 <0.30     Radiology: Dg Chest Port 1 View  12/30/2013   CLINICAL DATA:  Initial encounter for chest discomfort tachycardia and nausea beginning at 11 p.m. yesterday. Nonproductive cough for 2 days. Personal history of pericardial fusion. History of SVT in atrial fibrillation. Prostate cancer.  EXAM: PORTABLE CHEST - 1 VIEW  COMPARISON:  Two-view chest 07/21/2013.  FINDINGS: The heart is enlarged. Mild pulmonary vascular congestion is present. No focal airspace disease is evident. There is no frank edema or effusion to suggest failure. The lung volumes are low, exaggerating heart  size and interstitial markings.  IMPRESSION: 1. Mild cardiomegaly and pulmonary vascular congestion. 2. Low lung volumes.   Electronically Signed   By: Chanice Brenton Santiago M.D.   On: 12/30/2013 02:03     ECG: Atrial fib with NS ST-T abnormalities. HR of 162 bpm.    Impression and Recommendations  1.Atrial fib with RVR: Uncertain etiology. He has evidence of pulmonary vascular congestion on CXR and has been experiencing some wt gain, and increased abdominal girth, but only mild symptoms of this. He currently is on diltiazem gtt at 5 mg/hour which is maintaining his HR in the 90's but has bursts of elevated rates during assessment.   He is on metoprolol 50 mg XL at home. CHADS Vasc Score of 2, He is not currently anticoagulated at home. Potassium has been repleated and he has been placed back on metoprolol po. BP low  normal. Can transition to po diltiazem 30 mg Q 6 hours and watch HR control and BP response.  Consider starting anticoagulation therapy unless #2 is worsening requiring intervention.  2. Pericardial Effusion: Last echo in May of 2015 demonstrated normal EF of 65%, circumferential pericardial effusion was noted, modterate, anteriorly, larger collection posteriorly. No RV compromise. Will repeat echo for re-evaluation of this status.   3. Mild Pulmonary Vascular Congestion: Will check Pro-BNP. No overt fluid retention. Lung sounds are essentially clear except for the bases. He has mildly elevated WBC;s. Question rapid afib may have caused mild CHF. Can give one dose of lasix po, but breathing status is not compromised. Wt is unchanged since office visit documentation in July with Dr. Domenic Polite at 232 lbs.   4. Hypertension: He is on lisinopril at home at 20 mg daily. With institution of CCB, may not restart this unless echo demonstrates reduced EF and need to stop CCB.     Signed: Phill Myron. Laityn Bensen NP  12/30/2013, 9:10 AM Co-Sign MD

## 2013-12-30 NOTE — Progress Notes (Signed)
PT HAS NOT SHOWN ANY ADVERSE REACTION TO IV AMIODARONE.

## 2013-12-30 NOTE — Progress Notes (Signed)
UR chart review completed.  

## 2013-12-30 NOTE — ED Notes (Signed)
Patient states he was woke up by his heart feeling like it was going up and down.

## 2013-12-30 NOTE — ED Provider Notes (Signed)
CSN: [id number]     Arrival date & time 12/29/13  2359 History  This chart was scribed for Richarda Blade, MD by Tula Nakayama, ED Scribe. This patient was seen in room APA04/APA04 and the patient's care was started at 12:09 AM.   Chief Complaint  Patient presents with  . Palpitations   The history is provided by the patient. No language interpreter was used.   HPI Comments: [name] [name] is a 73 y.o. male who presents to the Emergency Department complaining of chest discomfort and racing heart palpitations that started earlier this afternoon. Pt was nauseated PTA in ED and states rhinnorhea and cough for two days as associated symptoms. He denies CP as an associated symptom. Pt has history of atrial fibrillation. Pt states that cardiologist said he had excess fluid in pericardial sac in 2008. He saw Dr. Domenic Polite 2 months ago who also stated he had fluid. Pt had hx of pericardial effusion 5 months ago. Pt takes Metoprolol daily. Pt's wife gave him one this morning and one PTA.    Domenic Polite is cardiologist  Past Medical History  Diagnosis Date  . Pericardial effusion     Idiopathic, recurrent pericardial effusion s/p pericardial window.  . Supraventricular tachycardia   . Paroxysmal atrial fibrillation   . Temporal arteritis   . Leg fracture     Distal tibial/fibula 2003  . Prostate cancer     Seed implants 2007   Past Surgical History  Procedure Laterality Date  . Pericardial window  December 2008   Family History  Problem Relation Age of Onset  . CAD Mother     MI in 48s  . Prostate cancer Father   . Prostate cancer Brother   . Lung cancer Brother    History  Substance Use Topics  . Smoking status: Former Smoker -- 0.30 packs/day for 3 years    Types: Cigarettes    Quit date: 03/27/1960  . Smokeless tobacco: Never Used  . Alcohol Use: No    Review of Systems  HENT: Positive for rhinorrhea.   Cardiovascular: Positive for palpitations. Negative for chest pain.   Gastrointestinal: Positive for nausea.  All other systems reviewed and are negative.  Allergies  Amiodarone hcl  Home Medications   Prior to Admission medications   Medication Sig Start Date End Date Taking? Authorizing Provider  allopurinol (ZYLOPRIM) 300 MG tablet Take 450 mg by mouth daily.     Historical Provider, MD  aspirin 81 MG tablet Take 81 mg by mouth daily.    Historical Provider, MD  benazepril (LOTENSIN) 20 MG tablet Take 20 mg by mouth daily.      Historical Provider, MD  chlorthalidone (HYGROTON) 25 MG tablet Take 1 tablet (25 mg total) by mouth daily. 05/29/13   Satira Sark, MD  fluticasone (FLONASE) 50 MCG/ACT nasal spray Place 2 sprays into the nose as needed.     Historical Provider, MD  metoprolol succinate (TOPROL-XL) 50 MG 24 hr tablet Take 1 tablet (50 mg total) by mouth daily. Take with or immediately following a meal. 09/08/13   Satira Sark, MD  Multiple Vitamin (MULTIVITAMIN) tablet Take 1 tablet by mouth daily.    Historical Provider, MD  oxyCODONE-acetaminophen (PERCOCET/ROXICET) 5-325 MG per tablet Take 1 tablet by mouth every 4 (four) hours as needed. 12/30/13   Richarda Blade, MD  pantoprazole (PROTONIX) 40 MG tablet Take 1 tablet (40 mg total) by mouth daily. 09/08/13   Satira Sark, MD  verapamil (CALAN-SR) 180 MG CR tablet Take 240 mg by mouth at bedtime. 05/13/12   Donney Dice, PA-C   BP 113/61  Pulse 80  Temp(Src) 98.9 F (37.2 C) (Oral)  Resp 16  Ht 6' (1.829 m)  Wt 232 lb (105.235 kg)  BMI 31.46 kg/m2  SpO2 92% Physical Exam  Nursing note and vitals reviewed. Constitutional: He is oriented to person, place, and time. He appears well-developed and well-nourished.  HENT:  Head: Normocephalic and atraumatic.  Right Ear: External ear normal.  Left Ear: External ear normal.  Eyes: Conjunctivae and EOM are normal. Pupils are equal, round, and reactive to light.  Neck: Normal range of motion and phonation normal. Neck supple.   Cardiovascular: Regular rhythm and normal heart sounds.   Tachycardia 160  Right radial pulse is 80  Pulmonary/Chest: Effort normal and breath sounds normal. No respiratory distress. He has no wheezes. He has no rales. He exhibits no bony tenderness.  Abdominal: Soft. There is no tenderness. There is no rebound and no guarding.  Musculoskeletal: Normal range of motion. He exhibits no edema and no tenderness.  Neurological: He is alert and oriented to person, place, and time. No cranial nerve deficit or sensory deficit. He exhibits normal muscle tone. Coordination normal.  Skin: Skin is warm, dry and intact.  Psychiatric: He has a normal mood and affect. His behavior is normal. Judgment and thought content normal.    ED Course  Procedures (including critical care time)  Medications  diltiazem (CARDIZEM) 1 mg/mL load via infusion 10 mg (0 mg Intravenous Stopped 12/30/13 0036)    And  diltiazem (CARDIZEM) 100 mg in dextrose 5 % 100 mL (1 mg/mL) infusion (15 mg/hr Intravenous Rate/Dose Change 12/30/13 0133)    Patient Vitals for the past 24 hrs:  BP Temp Temp src Pulse Resp SpO2 Height Weight  12/30/13 0230 113/61 mmHg - - 80 16 92 % - -  12/30/13 0208 125/77 mmHg - - 98 - 93 % - -  12/30/13 0130 115/71 mmHg - - 99 18 93 % - -  12/30/13 0100 111/72 mmHg - - 93 16 93 % - -  12/30/13 0035 - - - 118 - - - -  12/30/13 0004 125/81 mmHg 98.9 F (37.2 C) Oral 83 20 95 % 6' (1.829 m) 232 lb (105.235 kg)    3:18 AM Reevaluation with update and discussion. After initial assessment and treatment, an updated evaluation reveals he is more comfortable. Now, he remains in atrial fibrillation, with an improved rate. Findings discussed with patient and wife, all questions answered. [name] [name]   3:19 AM-Consult complete with . Patient case explained and discussed. He agrees to admit patient for further evaluation and treatment. Call ended at [place]: Oxygen Saturation is 95% on  RA, adequate by my interpretation.    COORDINATION OF CARE: 12:17 AM Will order chest x-ray. Discussed treatment plan with pt at bedside and pt agreed to plan.  CRITICAL CARE Performed by: Richarda Blade Total critical care time: 45 minutes Critical care time was exclusive of separately billable procedures and treating other patients. Critical care was necessary to treat or prevent imminent or life-threatening deterioration. Critical care was time spent personally by me on the following activities: development of treatment plan with patient and/or surrogate as well as nursing, discussions with consultants, evaluation of patient's response to treatment, examination of patient, obtaining history from patient or surrogate, ordering and performing treatments and interventions, ordering and review of  laboratory studies, ordering and review of radiographic studies, pulse oximetry and re-evaluation of patient's condition.   Labs Review Labs Reviewed  CBC WITH DIFFERENTIAL - Abnormal; Notable for the following:    WBC 11.5 (*)    Monocytes Absolute 1.2 (*)    All other components within normal limits  COMPREHENSIVE METABOLIC PANEL - Abnormal; Notable for the following:    Sodium 136 (*)    Potassium 3.5 (*)    Glucose, Bld 243 (*)    BUN 27 (*)    GFR calc non Af Amer 67 (*)    GFR calc Af Amer 77 (*)    All other components within normal limits  TROPONIN I  PROTIME-INR    Imaging Review Dg Chest Port 1 View  12/30/2013   CLINICAL DATA:  Initial encounter for chest discomfort tachycardia and nausea beginning at 11 p.m. yesterday. Nonproductive cough for 2 days. Personal history of pericardial fusion. History of SVT in atrial fibrillation. Prostate cancer.  EXAM: PORTABLE CHEST - 1 VIEW  COMPARISON:  Two-view chest 07/21/2013.  FINDINGS: The heart is enlarged. Mild pulmonary vascular congestion is present. No focal airspace disease is evident. There is no frank edema or effusion to suggest  failure. The lung volumes are low, exaggerating heart size and interstitial markings.  IMPRESSION: 1. Mild cardiomegaly and pulmonary vascular congestion. 2. Low lung volumes.   Electronically Signed   By: Lawrence Santiago M.D.   On: 12/30/2013 02:03     EKG Interpretation   Date/Time:  Tuesday December 30 2013 00:14:37 EDT Ventricular Rate:  163 PR Interval:    QRS Duration: 87 QT Interval:  297 QTC Calculation: 489 R Axis:   45 Text Interpretation:  Atrial fibrillation with rapid V-rate Abnormal  R-wave progression, early transition Repolarization abnormality, prob rate  related Since last tracing there is new rapid atrial fibrillation  Confirmed by Eulis Foster  MD, Vermon Grays [phone]) on 12/30/2013 3:20:56 AM       EKG Interpretation  Date/Time:  Tuesday December 30 2013 03:16:56 EDT Ventricular Rate:  100 PR Interval:    QRS Duration: 97 QT Interval:  365 QTC Calculation: 471 R Axis:   1 Text Interpretation:  Atrial fibrillation Low voltage, precordial leads RSR' in V1 or V2, right VCD or RVH Borderline T abnormalities, anterior leads Since last tracing of earlier today heart rate is improved Confirmed by Eulis Foster  MD, Akansha Wyche ([id number]) on 12/30/2013 3:22:42 AM       MDM   Final diagnoses:  Atrial fibrillation with RVR   Recurrent atrial fibrillation, with rapid ventricular rate. No apparent inciting cause. He is improved with rate control medications. There is no evidence for ACS, or pneumonia. I doubt PE. He will need to be admitted for further management and treatment.   Nursing Notes Reviewed/ Care Coordinated, and agree without changes. Applicable Imaging Reviewed.  Interpretation of Laboratory Data incorporated into ED treatment  Plan: Admit    I personally performed the services described in this documentation, which was scribed in my presence. The recorded information has been reviewed and is accurate.     Richarda Blade, MD 12/30/13 [phone]

## 2013-12-30 NOTE — Progress Notes (Signed)
Chart reviewed and patient examined.  Objective:  SBP range 100-132 HR range 46-91 resp 13 sats 100% on room air Afebrile   Subjective: Sitting up in bed eating breakfast. Reports feeling much better than when he came in. Denies pain/discomfort    PE: Gen: well nourished appears comfortable CV RRR no MGR No LE edema PPP Resp: normal effort BS clear bilaterally no crackles Abd: soft +BS non-tender to palpation Neuro: alert and oriented x3.   Assessment/plan #1 atrial fibrillation with RVR: Improve this am. Evaluated by cardiology who recommend transitioning to po cardizem. TSH pending as is  Echocardiogram. Continue with his beta blocker. Continue with aspirin. Anticoagulation will be deferred to cardiology.   #2 history of pericardial effusion status post window in 2008: He appears to have reaccumulated his effusion. Await echocardiogram..   #3 history of benign essential hypertension: Monitor blood pressures closely.  #4 hypokalemia: Potassium will be repleted. Will monitor  #5. Hyperglycemia: no hx diabetes. Will obtain A1c and monitor CBG's for optimal control.      Radene Gunning, NP

## 2013-12-30 NOTE — H&P (Signed)
[facility] History and Physical  [name]:[id number] DOB: 05/01/1940 DOA: 12/30/2013   PCP: Gar Ponto, MD  Specialists: He is followed by Dr. Domenic Polite, with cardiology  Chief Complaint: Palpitations  HPI: [name] [name] is a 73 y.o. male with a past medical history of paroxysmal atrial fibrillation, pericardial effusion status post pericardial window in 2008, hypertension, who was in his usual state of health last night when he was lying on his recliner and he felt as if his heart was racing. He felt uncomfortable. Denies any chest pain or shortness of breath. He felt nauseated, but denies any vomiting. No dizziness or lightheadedness. His wife checked his heart rate, and it was fluctuating between 120s to 160s. She gave him a dose of Toprol and he waited for a while without any improvement. He continued to feel nauseated and so decided to come in to the hospital. Patient says that he's had allergies with sneezing, runny nose, runny eyes for the last few days. He took two capsules of an allergy medicine, which he is unable to name. Otherwise, denies any fever or chills. No diarrhea. He states that he was admitted to [facility] earlier this year and at that time also he had atrial fibrillation. He denies taking any anticoagulation.   Home Medications: Prior to Admission medications   Medication Sig Start Date End Date Taking? Authorizing Provider  allopurinol (ZYLOPRIM) 300 MG tablet Take 450 mg by mouth daily.     Historical Provider, MD  aspirin 81 MG tablet Take 81 mg by mouth daily.    Historical Provider, MD  benazepril (LOTENSIN) 20 MG tablet Take 20 mg by mouth daily.      Historical Provider, MD  chlorthalidone (HYGROTON) 25 MG tablet Take 1 tablet (25 mg total) by mouth daily. 05/29/13   Satira Sark, MD  fluticasone (FLONASE) 50 MCG/ACT nasal spray Place 2 sprays into the nose as needed.     Historical Provider, MD  metoprolol succinate (TOPROL-XL) 50 MG 24  hr tablet Take 1 tablet (50 mg total) by mouth daily. Take with or immediately following a meal. 09/08/13   Satira Sark, MD  Multiple Vitamin (MULTIVITAMIN) tablet Take 1 tablet by mouth daily.    Historical Provider, MD  oxyCODONE-acetaminophen (PERCOCET/ROXICET) 5-325 MG per tablet Take 1 tablet by mouth every 4 (four) hours as needed. 12/30/13   Richarda Blade, MD  pantoprazole (PROTONIX) 40 MG tablet Take 1 tablet (40 mg total) by mouth daily. 09/08/13   Satira Sark, MD  verapamil (CALAN-SR) 180 MG CR tablet Take 240 mg by mouth at bedtime. 05/13/12   Donney Dice, PA-C    Allergies:  Allergies  Allergen Reactions  . Amiodarone Hcl     REACTION: rash    Past Medical History: Past Medical History  Diagnosis Date  . Pericardial effusion     Idiopathic, recurrent pericardial effusion s/p pericardial window.  . Supraventricular tachycardia   . Paroxysmal atrial fibrillation   . Temporal arteritis   . Leg fracture     Distal tibial/fibula 2003  . Prostate cancer     Seed implants 2007    Past Surgical History  Procedure Laterality Date  . Pericardial window  December 2008    Social History: He lives in [place] with his wife. He denies smoking, alcohol use or illicit drug use. Usually independent with daily activities.  Family History:  Family History  Problem Relation Age of Onset  . CAD Mother  MI in 21s  . Prostate cancer Father   . Prostate cancer Brother   . Lung cancer Brother      Review of Systems - History obtained from the patient General ROS: positive for  - fatigue Psychological ROS: negative Ophthalmic ROS: negative ENT ROS: negative Allergy and Immunology ROS: negative Hematological and Lymphatic ROS: negative Endocrine ROS: negative Respiratory ROS: no cough, shortness of breath, or wheezing Cardiovascular ROS: as in hpi Gastrointestinal ROS: no abdominal pain, change in bowel habits, or black or bloody stools Genito-Urinary ROS: no  dysuria, trouble voiding, or hematuria Musculoskeletal ROS: negative Neurological ROS: no TIA or stroke symptoms Dermatological ROS: negative  Physical Examination  Filed Vitals:   12/30/13 0130 12/30/13 0208 12/30/13 0230 12/30/13 0300  BP: 115/71 125/77 113/61 111/63  Pulse: 99 98 80 84  Temp:      TempSrc:      Resp: '18  16 16  ' Height:      Weight:      SpO2: 93% 93% 92% 93%    BP 111/63  Pulse 84  Temp(Src) 98.9 F (37.2 C) (Oral)  Resp 16  Ht 6' (1.829 m)  Wt 105.235 kg (232 lb)  BMI 31.46 kg/m2  SpO2 93%  General appearance: alert, cooperative, appears stated age and no distress Head: Normocephalic, without obvious abnormality, atraumatic Eyes: conjunctivae/corneas clear. PERRL, EOM's intact. Throat: lips, mucosa, and tongue normal; teeth and gums normal Neck: no adenopathy, no carotid bruit, no JVD, supple, symmetrical, trachea midline and thyroid not enlarged, symmetric, no tenderness/mass/nodules Resp: clear to auscultation bilaterally Cardio: S1 and S2 is irregularly irregular. No S3, S4. No rubs, murmurs, or bruit. No significant pedal edema GI: soft, non-tender; bowel sounds normal; no masses,  no organomegaly Extremities: extremities normal, atraumatic, no cyanosis or edema Pulses: 2+ and symmetric Skin: Skin color, texture, turgor normal. No rashes or lesions Neurologic: No focal neurological deficits are noted  Laboratory Data: Results for orders placed during the hospital encounter of 12/30/13 (from the past 48 hour(s))  CBC WITH DIFFERENTIAL     Status: Abnormal   Collection Time    12/30/13 12:14 AM      Result Value Ref Range   WBC 11.5 (*) 4.0 - 10.5 K/uL   RBC 4.47  4.22 - 5.81 MIL/uL   Hemoglobin 13.4  13.0 - 17.0 g/dL   HCT 39.5  39.0 - 52.0 %   MCV 88.4  78.0 - 100.0 fL   MCH 30.0  26.0 - 34.0 pg   MCHC 33.9  30.0 - 36.0 g/dL   RDW 14.3  11.5 - 15.5 %   Platelets 207  150 - 400 K/uL   Neutrophils Relative % 67  43 - 77 %   Neutro Abs  7.7  1.7 - 7.7 K/uL   Lymphocytes Relative 19  12 - 46 %   Lymphs Abs 2.2  0.7 - 4.0 K/uL   Monocytes Relative 11  3 - 12 %   Monocytes Absolute 1.2 (*) 0.1 - 1.0 K/uL   Eosinophils Relative 3  0 - 5 %   Eosinophils Absolute 0.3  0.0 - 0.7 K/uL   Basophils Relative 0  0 - 1 %   Basophils Absolute 0.1  0.0 - 0.1 K/uL  COMPREHENSIVE METABOLIC PANEL     Status: Abnormal   Collection Time    12/30/13 12:14 AM      Result Value Ref Range   Sodium 136 (*) 137 - 147 mEq/L   Potassium  3.5 (*) 3.7 - 5.3 mEq/L   Chloride 97  96 - 112 mEq/L   CO2 24  19 - 32 mEq/L   Glucose, Bld 243 (*) 70 - 99 mg/dL   BUN 27 (*) 6 - 23 mg/dL   Creatinine, Ser 1.07  0.50 - 1.35 mg/dL   Calcium 9.6  8.4 - 10.5 mg/dL   Total Protein 7.7  6.0 - 8.3 g/dL   Albumin 3.8  3.5 - 5.2 g/dL   AST 20  0 - 37 U/L   ALT 18  0 - 53 U/L   Alkaline Phosphatase 103  39 - 117 U/L   Total Bilirubin 0.6  0.3 - 1.2 mg/dL   GFR calc non Af Amer 67 (*) >90 mL/min   GFR calc Af Amer 77 (*) >90 mL/min   Comment: (NOTE)     The eGFR has been calculated using the CKD EPI equation.     This calculation has not been validated in all clinical situations.     eGFR's persistently <90 mL/min signify possible Chronic Kidney     Disease.   Anion gap 15  5 - 15  TROPONIN I     Status: None   Collection Time    12/30/13 12:14 AM      Result Value Ref Range   Troponin I <0.30  <0.30 ng/mL   Comment:            Due to the release kinetics of cTnI,     a negative result within the first hours     of the onset of symptoms does not rule out     myocardial infarction with certainty.     If myocardial infarction is still suspected,     repeat the test at appropriate intervals.  PROTIME-INR     Status: None   Collection Time    12/30/13 12:14 AM      Result Value Ref Range   Prothrombin Time 14.4  11.6 - 15.2 seconds   INR 1.12  0.00 - 1.49    Radiology Reports: Dg Chest Port 1 View  12/30/2013   CLINICAL DATA:  Initial encounter  for chest discomfort tachycardia and nausea beginning at 11 p.m. yesterday. Nonproductive cough for 2 days. Personal history of pericardial fusion. History of SVT in atrial fibrillation. Prostate cancer.  EXAM: PORTABLE CHEST - 1 VIEW  COMPARISON:  Two-view chest 07/21/2013.  FINDINGS: The heart is enlarged. Mild pulmonary vascular congestion is present. No focal airspace disease is evident. There is no frank edema or effusion to suggest failure. The lung volumes are low, exaggerating heart size and interstitial markings.  IMPRESSION: 1. Mild cardiomegaly and pulmonary vascular congestion. 2. Low lung volumes.   Electronically Signed   By: Lawrence Santiago M.D.   On: 12/30/2013 02:03    Electrocardiogram: Rapid atrial fibrillation at 160 beats per minute. Normal axis. Nonspecific ST and T wave changes. Repeat EKG shows A. fib at 100 beats per minute. Nonspecific T-wave changes are again noted.  Problem List  Principal Problem:   Atrial fibrillation with RVR Active Problems:   Essential hypertension, benign   Disease of pericardium   Assessment: This is a 73 year old, Caucasian male, who presents with palpitations and is found to have atrial fibrillation with rapid ventricular response. It is unclear what triggered this. He does have a history of pericardial effusion and has had a pericardial window placed in 2008. He had an echocardiogram back in May, which showed moderate pericardial  effusion at that time, and plan was for followup in 6 months. His recent allergy symptoms could have also contributed.  Plan: #1 atrial fibrillation with RVR: Heart rate is reasonably well controlled on and Cardizem infusion. This will be continued. He'll be admitted to the hospital. TSH will be checked. We will repeat another echocardiogram to see, if his effusion has increased in size. We will consult cardiology. We will monitor him in step down unit. At this time patient is hemodynamically stable. Continue with his  beta blocker. Hold off on his verapamil for now. Further management will be deferred to cardiology in the morning. Continue with aspirin. Anticoagulation will be deferred to cardiology.  #3 history of pericardial effusion status post window in 2008: He appears to have reaccumulated his effusion. Echocardiogram will show if the size has increased since May.  #3 history of benign essential hypertension: Monitor blood pressures closely.  #4 hypokalemia: Potassium will be repleted.   DVT Prophylaxis: Lovenox Code Status: Full code Family Communication: Discussed with the patient and his wife  Disposition Plan: Admit to step down   Further management decisions will depend on results of further testing and patient's response to treatment.   [facility]  [facility] Pager [phone]  If 7PM-7AM, please contact night-coverage [email or web address] Password [email or web address]  12/30/2013, 4:02 AM

## 2013-12-30 NOTE — ED Notes (Signed)
Pt reports being out of rhythm. States has happened before.

## 2013-12-30 NOTE — Progress Notes (Signed)
Inpatient Diabetes Program Recommendations  AACE/ADA: New Consensus Statement on Inpatient Glycemic Control (2013)  Target Ranges:  Prepandial:   less than 140 mg/dL      Peak postprandial:   less than 180 mg/dL (1-2 hours)      Critically ill patients:  140 - 180 mg/dL   Results for BUTLER, VEGH (MRN [id number]) as of 12/30/2013 07:29  Ref. Range 12/30/2013 00:14  Glucose Latest Range: 70-99 mg/dL 243 (H)   Diabetes history: No Outpatient Diabetes medications: NA Current orders for Inpatient glycemic control: None  Inpatient Diabetes Program Recommendations Correction (SSI): Patient has no history of diabetes. However, noted initial glucose was 243 mg/dl at 00:14. May want to consider ordering CBGs with Novolog correction if needed. HgbA1C: Please consider ordering an A1C to evaluate glycemic control over the past 2-3 months.  Thanks, Barnie Alderman, RN, MSN, CCRN Diabetes Coordinator Inpatient Diabetes Program [phone] (Team Pager) [phone] (AP office) [phone] [facility] office)

## 2013-12-30 NOTE — Progress Notes (Signed)
PT CONVERTED TO NSR.

## 2013-12-30 NOTE — Progress Notes (Signed)
Pt seen and examined. Agree with above assessment and plan by Ms. Black NP. Briefly, the patient presents with Afib RVR, requiring cardizem gtt on admission. The patient is now rate controlled. Plans to transition to PO cardizem with continued BB. Pending echo to re-eval pericardial effusion. Holding anticoagulation until plan is set forth regarding management of effusion.

## 2013-12-31 DIAGNOSIS — I313 Pericardial effusion (noninflammatory): Secondary | ICD-10-CM

## 2013-12-31 DIAGNOSIS — I3139 Other pericardial effusion (noninflammatory): Secondary | ICD-10-CM

## 2013-12-31 LAB — CBC
HCT: 36.5 % — ABNORMAL LOW (ref 39.0–52.0)
HEMOGLOBIN: 12.2 g/dL — AB (ref 13.0–17.0)
MCH: 29.7 pg (ref 26.0–34.0)
MCHC: 33.4 g/dL (ref 30.0–36.0)
MCV: 88.8 fL (ref 78.0–100.0)
Platelets: 214 10*3/uL (ref 150–400)
RBC: 4.11 MIL/uL — ABNORMAL LOW (ref 4.22–5.81)
RDW: 14.2 % (ref 11.5–15.5)
WBC: 8.2 10*3/uL (ref 4.0–10.5)

## 2013-12-31 LAB — COMPREHENSIVE METABOLIC PANEL
ALK PHOS: 84 U/L (ref 39–117)
ALT: 14 U/L (ref 0–53)
AST: 17 U/L (ref 0–37)
Albumin: 3.2 g/dL — ABNORMAL LOW (ref 3.5–5.2)
Anion gap: 13 (ref 5–15)
BUN: 18 mg/dL (ref 6–23)
CO2: 24 meq/L (ref 19–32)
Calcium: 9 mg/dL (ref 8.4–10.5)
Chloride: 103 mEq/L (ref 96–112)
Creatinine, Ser: 0.92 mg/dL (ref 0.50–1.35)
GFR, EST NON AFRICAN AMERICAN: 82 mL/min — AB (ref >90)
Glucose, Bld: 135 mg/dL — ABNORMAL HIGH (ref 70–99)
POTASSIUM: 3.7 meq/L (ref 3.7–5.3)
SODIUM: 140 meq/L (ref 137–147)
TOTAL PROTEIN: 6.7 g/dL (ref 6.0–8.3)
Total Bilirubin: 0.3 mg/dL (ref 0.3–1.2)

## 2013-12-31 LAB — HEMOGLOBIN A1C
HEMOGLOBIN A1C: 6.8 % — AB (ref <5.7)
Mean Plasma Glucose: 148 mg/dL — ABNORMAL HIGH (ref <117)

## 2013-12-31 LAB — GLUCOSE, CAPILLARY
GLUCOSE-CAPILLARY: 117 mg/dL — AB (ref 70–99)
GLUCOSE-CAPILLARY: 156 mg/dL — AB (ref 70–99)
Glucose-Capillary: 142 mg/dL — ABNORMAL HIGH (ref 70–99)
Glucose-Capillary: 136 mg/dL — ABNORMAL HIGH (ref 70–99)

## 2013-12-31 MED ORDER — HYDROCOD POLST-CHLORPHEN POLST 10-8 MG/5ML PO LQCR
5.0000 mL | Freq: Once | ORAL | Status: AC
  Administered 2013-12-31: 5 mL via ORAL
  Filled 2013-12-31: qty 5

## 2013-12-31 MED ORDER — VERAPAMIL HCL ER 240 MG PO TBCR
240.0000 mg | EXTENDED_RELEASE_TABLET | Freq: Every day | ORAL | Status: DC
  Administered 2013-12-31 – 2014-01-01 (×2): 240 mg via ORAL
  Filled 2013-12-31 (×2): qty 1

## 2013-12-31 MED ORDER — AMIODARONE HCL 200 MG PO TABS
200.0000 mg | ORAL_TABLET | Freq: Two times a day (BID) | ORAL | Status: DC
  Administered 2013-12-31 – 2014-01-01 (×3): 200 mg via ORAL
  Filled 2013-12-31 (×3): qty 1

## 2013-12-31 NOTE — Progress Notes (Signed)
Patient seen and examined. Agree with note by Dyanne Carrel. Here with a fib with RVR. HR appears controlled at present on IV amiodarone, PO diltiazem and PO metoprolol. Await cards input on possible [place] IV amio today. Pericardial effusion has decreased in size since last ECHO in 5/15. Hope for DC soon if HR remains controlled of IV amio.  Domingo Mend, MD [facility] Pager: [phone]

## 2013-12-31 NOTE — Progress Notes (Signed)
Patient has remained in NSR, with heart rate in 60's, since amiodarone gtt stopped at 1015 this am. Vital signs stable, patient ambulating in room, no complaints of pain.
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
## 2013-12-31 NOTE — Progress Notes (Signed)
The patient was seen and examined, and I agree with the assessment and plan as documented above, with modifications as noted below. Pt has since converted to normal sinus rhythm and is feeling very well. Currently on short-acting diltiazem q 6 hours and amiodarone drip along with metoprolol succinate. Pericardial effusion has decreased in size with no evidence for tamponade physiology.  RECS: Will switch short-acting diltiazem back to home dose of verapamil. Will switch IV amiodarone to oral amiodarone. Continue Toprol-XL. Will defer initiation of anticoagulation to outpatient setting with Dr. Domenic Polite.

## 2013-12-31 NOTE — Progress Notes (Signed)
Subjective:  Feeling much better. Wants to go home.  Objective:  Vital Signs in the last 24 hours: Temp:  [97.5 F (36.4 C)-98.9 F (37.2 C)] 97.5 F (36.4 C) (10/07 0800) Pulse Rate:  [58-109] 66 (10/07 0900) Resp:  [12-31] 18 (10/07 0900) BP: (75-134)/(49-108) 128/58 mmHg (10/07 0900) SpO2:  [89 %-100 %] 98 % (10/07 0900) Weight:  [235 lb 10.8 oz (106.9 kg)] 235 lb 10.8 oz (106.9 kg) (10/07 0432)  Intake/Output from previous day: 10/06 0701 - 10/07 0700 In: 2659.7 [P.O.:1680; I.V.:979.7] Out: 2550 [Urine:2550] Intake/Output from this shift: Total I/O In: 33.4 [I.V.:33.4] Out: -   Physical Exam: NECK: Without JVD, HJR, or bruit LUNGS: Clear anterior, posterior, lateral HEART: Regular rate and rhythm, 2/6 systolic murmur LSB and apex,no gallop, rub, bruit, thrill, or heave EXTREMITIES: Without cyanosis, clubbing, or edema   Lab Results:  Recent Labs  12/30/13 0014 12/31/13 0420  WBC 11.5* 8.2  HGB 13.4 12.2*  PLT 207 214    Recent Labs  12/30/13 0014 12/31/13 0420  NA 136* 140  K 3.5* 3.7  CL 97 103  CO2 24 24  GLUCOSE 243* 135*  BUN 27* 18  CREATININE 1.07 0.92    Recent Labs  12/30/13 0530 12/30/13 1058  TROPONINI <0.30 <0.30   Hepatic Function Panel  Recent Labs  12/31/13 0420  PROT 6.7  ALBUMIN 3.2*  AST 17  ALT 14  ALKPHOS 84  BILITOT 0.3   No results found for this basename: CHOL,  in the last 72 hours No results found for this basename: PROTIME,  in the last 72 hours  Imaging: Study Conclusions  - Left ventricle: The cavity size was below normal. Systolic   function was vigorous. The estimated ejection fraction was in the   range of 65% to 70%. Features are consistent with a pseudonormal   left ventricular filling pattern, with concomitant abnormal   relaxation and increased filling pressure (grade 2 diastolic   dysfunction). Doppler parameters are consistent with high   ventricular filling pressure. Moderate posterior wall  and severe   focal basal septal hypertrophy. - Aortic valve: Mildly calcified annulus. Trileaflet; mildly   thickened, mildly calcified leaflets. There was no stenosis. - Aorta: Mild aortic root dilatation. Aortic root dimension: 40 mm   (ED). - Mitral valve: Moderately calcified annulus. Mildly thickened   leaflets . - Left atrium: The atrium was moderately dilated. Volume/bsa, S:   37.3 ml/m^2. - Inferior vena cava: The vessel was normal in size. The   respirophasic diameter changes were in the normal range (>= 50%),   consistent with normal central venous pressure. - Pericardium, extracardiac: A moderate size circumferential   pericardial effusion is noted, with a maximum diameter of 1.56   cm. There is no RA/RV collapse with normal mitral inflow   variation. No suggestion of tamponade physiology.  Impressions:  - When compared to the most recent study dated 07/25/13, the   pericardial effusion has decreased in size. No evidence of   tamponade physiology.   Cardiac Studies:  Assessment/Plan:  1.Atrial fib with RVR: Converted to NSR He is on metoprolol 50 mg XL at home. CHADS Vasc Score of 2, He is not currently anticoagulated at home. Will switch to oral Amiodarone and DC IV. Was on verapamil 240mg  at home. Now on Cardizem 30mg  q6  2. Pericardial Effusion: Decrease in pericardial effusion on 2Decho. See above.  3. Mild Pulmonary Vascular Congestion: Most likely due to rapid atrial fib.Resolved.  4.  Hypertension: BP on low side. Continue to hold Lisinopril.   LOS: 1 day    Ermalinda Barrios 12/31/2013, 10:09 AM

## 2013-12-31 NOTE — Progress Notes (Signed)
Patient transferred to room 309. Vital signs stable, low fall risk. Report given to Laddie Aquas RN.

## 2014-01-01 DIAGNOSIS — R0989 Other specified symptoms and signs involving the circulatory and respiratory systems: Secondary | ICD-10-CM

## 2014-01-01 LAB — CBC
HCT: 37 % — ABNORMAL LOW (ref 39.0–52.0)
Hemoglobin: 12.4 g/dL — ABNORMAL LOW (ref 13.0–17.0)
MCH: 30 pg (ref 26.0–34.0)
MCHC: 33.5 g/dL (ref 30.0–36.0)
MCV: 89.6 fL (ref 78.0–100.0)
Platelets: 222 10*3/uL (ref 150–400)
RBC: 4.13 MIL/uL — ABNORMAL LOW (ref 4.22–5.81)
RDW: 14 % (ref 11.5–15.5)
WBC: 7.3 10*3/uL (ref 4.0–10.5)

## 2014-01-01 LAB — BASIC METABOLIC PANEL
ANION GAP: 11 (ref 5–15)
BUN: 16 mg/dL (ref 6–23)
CO2: 29 mEq/L (ref 19–32)
Calcium: 9.2 mg/dL (ref 8.4–10.5)
Chloride: 101 mEq/L (ref 96–112)
Creatinine, Ser: 1 mg/dL (ref 0.50–1.35)
GFR, EST AFRICAN AMERICAN: 84 mL/min — AB (ref >90)
GFR, EST NON AFRICAN AMERICAN: 73 mL/min — AB (ref >90)
Glucose, Bld: 118 mg/dL — ABNORMAL HIGH (ref 70–99)
POTASSIUM: 4.2 meq/L (ref 3.7–5.3)
SODIUM: 141 meq/L (ref 137–147)

## 2014-01-01 LAB — GLUCOSE, CAPILLARY
GLUCOSE-CAPILLARY: 132 mg/dL — AB (ref 70–99)
Glucose-Capillary: 133 mg/dL — ABNORMAL HIGH (ref 70–99)

## 2014-01-01 MED ORDER — OXYCODONE-ACETAMINOPHEN 5-325 MG PO TABS: 1.0000 | ORAL_TABLET | ORAL | Status: DC | PRN

## 2014-01-01 MED ORDER — AMIODARONE HCL 200 MG PO TABS: ORAL_TABLET | ORAL | Status: DC

## 2014-01-01 NOTE — Progress Notes (Signed)
SUBJECTIVE: Pt feeling well and denies chest pain, palpitations, and shortness of breath. Ready to go home.     Intake/Output Summary (Last 24 hours) at 01/01/14 0939 Last data filed at 12/31/13 1500  Gross per 24 hour  Intake      0 ml  Output    500 ml  Net   -500 ml    Current Facility-Administered Medications  Medication Dose Route Frequency Provider Last Rate Last Dose  . acetaminophen (TYLENOL) tablet 650 mg  650 mg Oral Q6H PRN Bonnielee Haff, MD       Or  . acetaminophen (TYLENOL) suppository 650 mg  650 mg Rectal Q6H PRN Bonnielee Haff, MD      . allopurinol (ZYLOPRIM) tablet 450 mg  450 mg Oral Daily Bonnielee Haff, MD   450 mg at 01/01/14 0853  . amiodarone (PACERONE) tablet 200 mg  200 mg Oral BID Herminio Commons, MD   200 mg at 01/01/14 0855  . aspirin EC tablet 81 mg  81 mg Oral Daily Bonnielee Haff, MD   81 mg at 01/01/14 0852  . benzonatate (TESSALON) capsule 100 mg  100 mg Oral TID PRN Donne Hazel, MD   100 mg at 12/30/13 1745  . enoxaparin (LOVENOX) injection 40 mg  40 mg Subcutaneous Q24H Bonnielee Haff, MD   40 mg at 01/01/14 0851  . insulin aspart (novoLOG) injection 0-9 Units  0-9 Units Subcutaneous TID WC Radene Gunning, NP   1 Units at 01/01/14 [phone]  . metoprolol succinate (TOPROL-XL) 24 hr tablet 50 mg  50 mg Oral Daily Bonnielee Haff, MD   50 mg at 12/31/13 0950  . morphine 2 MG/ML injection 1 mg  1 mg Intravenous Q3H PRN Bonnielee Haff, MD      . ondansetron (ZOFRAN) tablet 4 mg  4 mg Oral Q6H PRN Bonnielee Haff, MD       Or  . ondansetron (ZOFRAN) injection 4 mg  4 mg Intravenous Q6H PRN Bonnielee Haff, MD      . pantoprazole (PROTONIX) EC tablet 40 mg  40 mg Oral Daily Bonnielee Haff, MD   40 mg at 01/01/14 0855  . sodium chloride 0.9 % injection 3 mL  3 mL Intravenous Q12H Bonnielee Haff, MD   3 mL at 01/01/14 0856  . verapamil (CALAN-SR) CR tablet 240 mg  240 mg Oral Daily Herminio Commons, MD   240 mg at 01/01/14 0853    Filed  Vitals:   12/31/13 1300 12/31/13 1400 12/31/13 2100 01/01/14 0543  BP: 122/44 125/50 143/61 139/59  Pulse: 66 63 67 60  Temp:    98 F (36.7 C)  TempSrc:    Oral  Resp: 14 20 18 18   Height:      Weight:      SpO2: 99% 98% 98% 97%    PHYSICAL EXAM General: NAD HEENT: Normal. Neck: No JVD, no thyromegaly.  Lungs: Clear to auscultation bilaterally with normal respiratory effort. CV: Nondisplaced PMI.  Regular rate and rhythm, normal S1/S2, no S3/S4, no murmur.  No pretibial edema.  No carotid bruit.  Normal pedal pulses.  Abdomen: Soft, nontender, no hepatosplenomegaly, no distention.  Neurologic: Alert and oriented x 3.  Psych: Normal affect. Musculoskeletal: Normal range of motion. No gross deformities. Extremities: No clubbing or cyanosis.   TELEMETRY: Reviewed telemetry pt in NSR.  LABS: Basic Metabolic Panel:  Recent Labs  12/31/13 0420 01/01/14 0547  NA 140 141  K  3.7 4.2  CL 103 101  CO2 24 29  GLUCOSE 135* 118*  BUN 18 16  CREATININE 0.92 1.00  CALCIUM 9.0 9.2   Liver Function Tests:  Recent Labs  12/30/13 0014 12/31/13 0420  AST 20 17  ALT 18 14  ALKPHOS 103 84  BILITOT 0.6 0.3  PROT 7.7 6.7  ALBUMIN 3.8 3.2*   No results found for this basename: LIPASE, AMYLASE,  in the last 72 hours CBC:  Recent Labs  12/30/13 0014 12/31/13 0420 01/01/14 0850  WBC 11.5* 8.2 7.3  NEUTROABS 7.7  --   --   HGB 13.4 12.2* 12.4*  HCT 39.5 36.5* 37.0*  MCV 88.4 88.8 89.6  PLT 207 214 222   Cardiac Enzymes:  Recent Labs  12/30/13 0014 12/30/13 0530 12/30/13 1058  TROPONINI <0.30 <0.30 <0.30   BNP: No components found with this basename: POCBNP,  D-Dimer: No results found for this basename: DDIMER,  in the last 72 hours Hemoglobin A1C:  Recent Labs  12/31/13 0420  HGBA1C 6.8*   Fasting Lipid Panel: No results found for this basename: CHOL, HDL, LDLCALC, TRIG, CHOLHDL, LDLDIRECT,  in the last 72 hours Thyroid Function Tests:  Recent Labs   12/30/13 0530  TSH 3.510   Anemia Panel: No results found for this basename: VITAMINB12, FOLATE, FERRITIN, TIBC, IRON, RETICCTPCT,  in the last 72 hours  RADIOLOGY: Dg Chest Port 1 View  12/30/2013   CLINICAL DATA:  Initial encounter for chest discomfort tachycardia and nausea beginning at 11 p.m. yesterday. Nonproductive cough for 2 days. Personal history of pericardial fusion. History of SVT in atrial fibrillation. Prostate cancer.  EXAM: PORTABLE CHEST - 1 VIEW  COMPARISON:  Two-view chest 07/21/2013.  FINDINGS: The heart is enlarged. Mild pulmonary vascular congestion is present. No focal airspace disease is evident. There is no frank edema or effusion to suggest failure. The lung volumes are low, exaggerating heart size and interstitial markings.  IMPRESSION: 1. Mild cardiomegaly and pulmonary vascular congestion. 2. Low lung volumes.   Electronically Signed   By: Lawrence Santiago M.D.   On: 12/30/2013 02:03      ASSESSMENT AND PLAN: 1. Atrial fibrillation: Maintaining sinus rhythm on amiodarone, metoprolol, and verapamil. Would maintain on amiodarone 200 mg bid x 2 weeks, then 200 mg daily. This may not have to be used chronically. No anticoagulation for now, continue ASA 81 mg. Will defer anticoagulation decisions to Dr. Domenic Polite. Will arrange for outpt f/u within next few weeks. 2. Essential HTN: Reasonably controlled. 3. Pericardial effusion: Decreased in size, no tamponade physiology. 4. Pulmonary vascular congestion: Resolved with diuresis and HR control, caused by rapid atrial fib.  Dispo: Pt can be d/c from my standpoint. No further recommendations.  Kate Sable, M.D., F.A.C.C.

## 2014-01-01 NOTE — Discharge Summary (Signed)
Agree with above assessment. Patient to be discharged home today. He has converted to normal sinus rhythm and is on by mouth amiodarone, rate controlled on diltiazem and metoprolol. Has been seen by cardiology in the hospital with plans to defer plan of anticoagulation to his primary cardiologist.  Domingo Mend, MD [facility] Pager: [phone]

## 2014-01-01 NOTE — Progress Notes (Signed)
Discharge teaching done with paperwork with wife and pt, all questions answered.  Pt belongings collected and pt taken via wheelchair to main exit to meet wife with car.  Pt was stable at time of discharge.

## 2014-01-01 NOTE — Discharge Summary (Signed)
Physician Discharge Summary  [name] JKD:[id number] DOB: January 06, 1941 DOA: 12/30/2013  PCP: Gar Ponto, MD  Admit date: 12/30/2013 Discharge date: 01/01/2014  Time spent: 40 minutes  Recommendations for Outpatient Follow-up:  1. Dr McDowell's office will contact for appointment to be schedule for 2-3 weeks. 2. PCP 1-2 weeks follow up on rash.   Discharge Diagnoses:  Principal Problem:   Atrial fibrillation with RVR Active Problems:   Essential hypertension, benign   Hyperglycemia   Hypokalemia   Leukocytosis   Pericardial effusion without cardiac tamponade   Discharge Condition: stable  Diet recommendation: heart healthy  Filed Weights   12/30/13 0004 12/30/13 0500 12/31/13 0432  Weight: 105.235 kg (232 lb) 103.2 kg (227 lb 8.2 oz) 106.9 kg (235 lb 10.8 oz)    History of present illness:  [name] [name] is a 73 y.o. male with a past medical history of paroxysmal atrial fibrillation, pericardial effusion status post pericardial window in 2008, hypertension, who was in his usual state of health 10/5//15 when he was lying on his recliner and he felt as if his heart was racing. He felt uncomfortable. Denied any chest pain or shortness of breath. He felt nauseated, but denied any vomiting. No dizziness or lightheadedness. His wife checked his heart rate, and it was fluctuating between 120s to 160s. She gave him a dose of Toprol and he waited for a while without any improvement. He continued to feel nauseated and so decided to come in to the hospital. Patient said that he'd had allergies with sneezing, runny nose, runny eyes for the prior few days. He took two capsules of an allergy medicine, which he was unable to name. Otherwise, denied any fever or chills. No diarrhea. He stated that he was admitted to [facility] earlier this year and at that time also he had atrial fibrillation. He denied taking any anticoagulation.   Hospital Course:  Atrial fibrillation with RVR:  admitted to unit on diltiazem drip. HR difficult to control. Evaluated by cardiology who provideded  amiodarone drip, po diltiazem and po metoprolol. Converted to NSR 12/31/13. Will be discharged on po amiodarone. Defer  Anticoagulation to [facility]. Will continue 81mg  aspirin at discharge. Follow up to be arranged by cardiology for 2-3 weeks.  Pericardial effusion: repeat echo as below. Evaluated by cardiology who opine effusion decreased in size and no evidence tamponade.   Essential hypertension, controlled.  Hyperglycemia: fair control. A1c 6.9.   Hypokalemia: resolved at discharge  Leukocytosis: resolved at discharge  Rash: Back on right medial aspect at thorasic level.     Procedures: Echo 12/2103 The estimated ejection fraction was in the range of 65% to 70%. Features are consistent with a pseudonormal left ventricular filling pattern, with concomitant abnormal relaxation and increased filling pressure (grade 2 diastolic dysfunction). Doppler parameters are consistent with high ventricular filling pressure. Moderate posterior wall and severe focal basal septal hypertrophy.When compared to the most recent study dated 07/25/13, the pericardial effusion has decreased in size. No evidence of tamponade physiology   Consultations:  none  Discharge Exam: Filed Vitals:   01/01/14 0543  BP: 139/59  Pulse: 60  Temp: 98 F (36.7 C)  Resp: 18    General: sitting in chair eating lunch Cardiovascular: RRR No MGR No LE edema Respiratory: normal effort BS clear bilaterally no wheeze  Discharge Instructions You were cared for by a hospitalist during your hospital stay. If you have any questions about your discharge medications or the care you received while  you were in the hospital after you are discharged, you can call the unit and asked to speak with the hospitalist on call if the hospitalist that took care of you is not available. Once you are discharged, your primary care  physician will handle any further medical issues. Please note that NO REFILLS for any discharge medications will be authorized once you are discharged, as it is imperative that you return to your primary care physician (or establish a relationship with a primary care physician if you do not have one) for your aftercare needs so that they can reassess your need for medications and monitor your lab values.  Discharge Instructions   Diet - low sodium heart healthy    Complete by:  As directed      Discharge instructions    Complete by:  As directed   Take medications as directed. Dr McDowell's office will contact you for follow up appointment     Increase activity slowly    Complete by:  As directed           Current Discharge Medication List    START taking these medications   Details  amiodarone (PACERONE) 200 MG tablet Take 1 tab twice daily for 14 days then take 1 tab daily. Qty: 60 tablet, Refills: 0    oxyCODONE-acetaminophen (PERCOCET/ROXICET) 5-325 MG per tablet Take 1 tablet by mouth every 4 (four) hours as needed. Qty: 6 tablet, Refills: 0      CONTINUE these medications which have NOT CHANGED   Details  acetaminophen (TYLENOL) 500 MG tablet Take 1,000 mg by mouth every 6 (six) hours as needed for moderate pain.    allopurinol (ZYLOPRIM) 300 MG tablet Take 450 mg by mouth daily.     aspirin 81 MG tablet Take 81 mg by mouth daily.    fluticasone (FLONASE) 50 MCG/ACT nasal spray Place 2 sprays into the nose as needed for allergies or rhinitis.     metoprolol succinate (TOPROL-XL) 50 MG 24 hr tablet Take 1 tablet (50 mg total) by mouth daily. Take with or immediately following a meal. Qty: 30 tablet, Refills: 6    Multiple Vitamin (MULTIVITAMIN) tablet Take 1 tablet by mouth daily.    pantoprazole (PROTONIX) 40 MG tablet Take 1 tablet (40 mg total) by mouth daily. Qty: 30 tablet, Refills: 6    verapamil (CALAN-SR) 240 MG CR tablet Take 240 mg by mouth at bedtime.       STOP taking these medications     benazepril (LOTENSIN) 20 MG tablet      chlorthalidone (HYGROTON) 25 MG tablet        No Active Allergies Follow-up Information   Follow up with Rozann Lesches, MD. (office will contact you for follow up appointment)    Specialty:  Cardiology   Contact information:   [address] [place] [address] [phone]        The results of significant diagnostics from this hospitalization (including imaging, microbiology, ancillary and laboratory) are listed below for reference.    Significant Diagnostic Studies: Dg Chest Port 1 View  12/30/2013   CLINICAL DATA:  Initial encounter for chest discomfort tachycardia and nausea beginning at 11 p.m. yesterday. Nonproductive cough for 2 days. Personal history of pericardial fusion. History of SVT in atrial fibrillation. Prostate cancer.  EXAM: PORTABLE CHEST - 1 VIEW  COMPARISON:  Two-view chest 07/21/2013.  FINDINGS: The heart is enlarged. Mild pulmonary vascular congestion is present. No focal airspace disease is evident. There  is no frank edema or effusion to suggest failure. The lung volumes are low, exaggerating heart size and interstitial markings.  IMPRESSION: 1. Mild cardiomegaly and pulmonary vascular congestion. 2. Low lung volumes.   Electronically Signed   By: Lawrence Santiago M.D.   On: 12/30/2013 02:03    Microbiology: Recent Results (from the past 240 hour(s))  MRSA PCR SCREENING     Status: None   Collection Time    12/30/13  5:11 AM      Result Value Ref Range Status   MRSA by PCR NEGATIVE  NEGATIVE Final   Comment:            The GeneXpert MRSA Assay (FDA     approved for NASAL specimens     only), is one component of a     comprehensive MRSA colonization     surveillance program. It is not     intended to diagnose MRSA     infection nor to guide or     monitor treatment for     MRSA infections.     Labs: Basic Metabolic Panel:  Recent Labs Lab 12/30/13 0014  12/31/13 0420 01/01/14 0547  NA 136* 140 141  K 3.5* 3.7 4.2  CL 97 103 101  CO2 24 24 29   GLUCOSE 243* 135* 118*  BUN 27* 18 16  CREATININE 1.07 0.92 1.00  CALCIUM 9.6 9.0 9.2   Liver Function Tests:  Recent Labs Lab 12/30/13 0014 12/31/13 0420  AST 20 17  ALT 18 14  ALKPHOS 103 84  BILITOT 0.6 0.3  PROT 7.7 6.7  ALBUMIN 3.8 3.2*   No results found for this basename: LIPASE, AMYLASE,  in the last 168 hours No results found for this basename: AMMONIA,  in the last 168 hours CBC:  Recent Labs Lab 12/30/13 0014 12/31/13 0420 01/01/14 0850  WBC 11.5* 8.2 7.3  NEUTROABS 7.7  --   --   HGB 13.4 12.2* 12.4*  HCT 39.5 36.5* 37.0*  MCV 88.4 88.8 89.6  PLT 207 214 222   Cardiac Enzymes:  Recent Labs Lab 12/30/13 0014 12/30/13 0530 12/30/13 1058  TROPONINI <0.30 <0.30 <0.30   BNP: BNP (last 3 results) No results found for this basename: PROBNP,  in the last 8760 hours CBG:  Recent Labs Lab 12/31/13 1136 12/31/13 1637 12/31/13 2038 01/01/14 0741 01/01/14 1136  GLUCAP 136* 117* 156* 133* 132*       Signed:  Leylani Duley M  [facility] 01/01/2014, 12:30 PM

## 2014-01-05 ENCOUNTER — Emergency Department (HOSPITAL_COMMUNITY)
Admission: EM | Admit: 2014-01-05 | Discharge: 2014-01-05 | Disposition: A | Discharge status: Home / Self Care | Payer: Medicare Other | Attending: Emergency Medicine | Admitting: Emergency Medicine

## 2014-01-05 ENCOUNTER — Encounter (HOSPITAL_COMMUNITY): Payer: Self-pay | Admitting: Emergency Medicine

## 2014-01-05 ENCOUNTER — Emergency Department (HOSPITAL_COMMUNITY): Payer: Medicare Other

## 2014-01-05 ENCOUNTER — Telehealth: Payer: Self-pay | Admitting: *Deleted

## 2014-01-05 DIAGNOSIS — I9589 Other hypotension: Secondary | ICD-10-CM

## 2014-01-05 DIAGNOSIS — R42 Dizziness and giddiness: Secondary | ICD-10-CM

## 2014-01-05 DIAGNOSIS — Z8739 Personal history of other diseases of the musculoskeletal system and connective tissue: Secondary | ICD-10-CM | POA: Insufficient documentation

## 2014-01-05 DIAGNOSIS — Z7952 Long term (current) use of systemic steroids: Secondary | ICD-10-CM | POA: Insufficient documentation

## 2014-01-05 DIAGNOSIS — Z8781 Personal history of (healed) traumatic fracture: Secondary | ICD-10-CM | POA: Diagnosis not present

## 2014-01-05 DIAGNOSIS — I4891 Unspecified atrial fibrillation: Secondary | ICD-10-CM

## 2014-01-05 DIAGNOSIS — Z7982 Long term (current) use of aspirin: Secondary | ICD-10-CM | POA: Diagnosis not present

## 2014-01-05 DIAGNOSIS — R0789 Other chest pain: Secondary | ICD-10-CM | POA: Insufficient documentation

## 2014-01-05 DIAGNOSIS — Z87891 Personal history of nicotine dependence: Secondary | ICD-10-CM | POA: Insufficient documentation

## 2014-01-05 DIAGNOSIS — Z8546 Personal history of malignant neoplasm of prostate: Secondary | ICD-10-CM | POA: Diagnosis not present

## 2014-01-05 DIAGNOSIS — Z79899 Other long term (current) drug therapy: Secondary | ICD-10-CM | POA: Diagnosis not present

## 2014-01-05 DIAGNOSIS — I313 Pericardial effusion (noninflammatory): Secondary | ICD-10-CM

## 2014-01-05 DIAGNOSIS — R001 Bradycardia, unspecified: Secondary | ICD-10-CM | POA: Diagnosis not present

## 2014-01-05 DIAGNOSIS — I319 Disease of pericardium, unspecified: Secondary | ICD-10-CM

## 2014-01-05 DIAGNOSIS — R110 Nausea: Secondary | ICD-10-CM | POA: Diagnosis not present

## 2014-01-05 DIAGNOSIS — I3139 Other pericardial effusion (noninflammatory): Secondary | ICD-10-CM

## 2014-01-05 LAB — CBC WITH DIFFERENTIAL/PLATELET
BASOS PCT: 0 % (ref 0–1)
Basophils Absolute: 0 10*3/uL (ref 0.0–0.1)
EOS PCT: 1 % (ref 0–5)
Eosinophils Absolute: 0.1 10*3/uL (ref 0.0–0.7)
HCT: 37.1 % — ABNORMAL LOW (ref 39.0–52.0)
Hemoglobin: 12.6 g/dL — ABNORMAL LOW (ref 13.0–17.0)
Lymphocytes Relative: 13 % (ref 12–46)
Lymphs Abs: 1.2 10*3/uL (ref 0.7–4.0)
MCH: 30.1 pg (ref 26.0–34.0)
MCHC: 34 g/dL (ref 30.0–36.0)
MCV: 88.5 fL (ref 78.0–100.0)
Monocytes Absolute: 0.5 10*3/uL (ref 0.1–1.0)
Monocytes Relative: 5 % (ref 3–12)
NEUTROS PCT: 81 % — AB (ref 43–77)
Neutro Abs: 7.7 10*3/uL (ref 1.7–7.7)
PLATELETS: 265 10*3/uL (ref 150–400)
RBC: 4.19 MIL/uL — ABNORMAL LOW (ref 4.22–5.81)
RDW: 13.7 % (ref 11.5–15.5)
WBC: 9.5 10*3/uL (ref 4.0–10.5)

## 2014-01-05 LAB — URINALYSIS, ROUTINE W REFLEX MICROSCOPIC
Bilirubin Urine: NEGATIVE
Glucose, UA: NEGATIVE mg/dL
HGB URINE DIPSTICK: NEGATIVE
Ketones, ur: NEGATIVE mg/dL
LEUKOCYTES UA: NEGATIVE
NITRITE: NEGATIVE
PROTEIN: 30 mg/dL — AB
SPECIFIC GRAVITY, URINE: 1.01 (ref 1.005–1.030)
UROBILINOGEN UA: 1 mg/dL (ref 0.0–1.0)
pH: 6.5 (ref 5.0–8.0)

## 2014-01-05 LAB — URINE MICROSCOPIC-ADD ON

## 2014-01-05 LAB — TROPONIN I: Troponin I: <0.3 ng/mL (ref <0.30)

## 2014-01-05 LAB — COMPREHENSIVE METABOLIC PANEL
ALBUMIN: 3.7 g/dL (ref 3.5–5.2)
ALK PHOS: 94 U/L (ref 39–117)
ALT: 19 U/L (ref 0–53)
AST: 25 U/L (ref 0–37)
Anion gap: 13 (ref 5–15)
BUN: 22 mg/dL (ref 6–23)
CALCIUM: 9.5 mg/dL (ref 8.4–10.5)
CO2: 23 mEq/L (ref 19–32)
Chloride: 100 mEq/L (ref 96–112)
Creatinine, Ser: 1.33 mg/dL (ref 0.50–1.35)
GFR calc non Af Amer: 51 mL/min — ABNORMAL LOW (ref >90)
GFR, EST AFRICAN AMERICAN: 60 mL/min — AB (ref >90)
Glucose, Bld: 128 mg/dL — ABNORMAL HIGH (ref 70–99)
POTASSIUM: 4.2 meq/L (ref 3.7–5.3)
Sodium: 136 mEq/L — ABNORMAL LOW (ref 137–147)
Total Bilirubin: 0.2 mg/dL — ABNORMAL LOW (ref 0.3–1.2)
Total Protein: 7.4 g/dL (ref 6.0–8.3)

## 2014-01-05 NOTE — Telephone Encounter (Signed)
Patient informed and verbalized understanding of plan. 

## 2014-01-05 NOTE — Telephone Encounter (Addendum)
BP104/47 & HR 42. Patient c/o being very dizzy and sweating profusely. Patient is lying down now and since lying down the dizziness has improved. Patient shirt is soaking wet from the sweat. Patient felt fine before taking medications this morning. Today patient took metoprolol 50 mg and amiodarone 200 mg. Patient did eat and drink this morning. No chest pain or sob. Wife rechecked BP while on phone and BP113/51 HR 42. Nurse advised wife that message would be sent to MD for advice.

## 2014-01-05 NOTE — ED Provider Notes (Signed)
Patient walked around the emergency department without chest pain, dyspnea, confusion, neurodeficits.  Nat Christen, MD 01/05/14 1729

## 2014-01-05 NOTE — Discharge Instructions (Signed)
Bradycardia do not take amiodarone tonight. Stop verapamil indefinitely. Followup Dr. Domenic Polite this week. The office will call you. Return to the ED if you develop new or worsening symptoms.  Bradycardia is a term for a heart rate (pulse) that, in adults, is slower than 60 beats per minute. A normal rate is 60 to 100 beats per minute. A heart rate below 60 beats per minute may be normal for some adults with healthy hearts. If the rate is too slow, the heart may have trouble pumping the volume of blood the body needs. If the heart rate gets too low, blood flow to the brain may be decreased and may make you feel lightheaded, dizzy, or faint. The heart has a natural pacemaker in the top of the heart called the SA node (sinoatrial or sinus node). This pacemaker sends out regular electrical signals to the muscle of the heart, telling the heart muscle when to beat (contract). The electrical signal travels from the upper parts of the heart (atria) through the AV node (atrioventricular node), to the lower chambers of the heart (ventricles). The ventricles squeeze, pumping the blood from your heart to your lungs and to the rest of your body. CAUSES   Problem with the heart's electrical system.  Problem with the heart's natural pacemaker.  Heart disease, damage, or infection.  Medications.  Problems with minerals and salts (electrolytes). SYMPTOMS   Fainting (syncope).  Fatigue and weakness.  Shortness of breath (dyspnea).  Chest pain (angina).  Drowsiness.  Confusion. DIAGNOSIS   An electrocardiogram (ECG) can help your caregiver determine the type of slow heart rate you have.  If the cause is not seen on an ECG, you may need to wear a heart monitor that records your heart rhythm for several hours or days.  Blood tests. TREATMENT   Electrolyte supplements.  Medications.  Withholding medication which is causing a slow heart rate.  Pacemaker placement. SEEK IMMEDIATE MEDICAL CARE  IF:   You feel lightheaded or faint.  You develop an irregular heart rate.  You feel chest pain or have trouble breathing. MAKE SURE YOU:   Understand these instructions.  Will watch your condition.  Will get help right away if you are not doing well or get worse. Document Released: 12/03/2001 Document Revised: 06/05/2011 Document Reviewed: 06/18/2013 [facility] Patient Information 2015 [place], [place]. This information is not intended to replace advice given to you by your health care provider. Make sure you discuss any questions you have with your health care provider.

## 2014-01-05 NOTE — ED Notes (Signed)
Patient with no complaints at this time. Respirations even and unlabored. Skin warm/dry. Discharge instructions reviewed with patient at this time. Patient given opportunity to voice concerns/ask questions. Patient discharged at this time and left Emergency Department with steady gait.   

## 2014-01-05 NOTE — Consult Note (Signed)
Primary Physician: Primary Cardiologist:   HPI:  [name] is a 73 yo who is well known to cardiology  Followed by Dr [name] [name]   He was recently admitted to [facility] with atrial fibrillation.    Also has history of pericardial effusion and HTN  He converted to SR  Placed on amiodarone  No anticoagulation  Echo showed pericardial effusion was decreased in size  No tamponade      Today called  HR 42  BP 104/47  Dizzy and sweaty.    He had taken meds earlier.  Was doing ok until today        Past Medical History  Diagnosis Date  . Pericardial effusion     Idiopathic, recurrent pericardial effusion s/p pericardial window.  . Supraventricular tachycardia   . Paroxysmal atrial fibrillation   . Temporal arteritis   . Leg fracture     Distal tibial/fibula 2003  . Prostate cancer     Seed implants 2007     (Not in a hospital admission)     Infusions:   No Known Allergies  History   Social History  . Marital Status: Married    Spouse Name: N/A    Number of Children: N/A  . Years of Education: N/A   Occupational History  . Not on file.   Social History Main Topics  . Smoking status: Former Smoker -- 0.30 packs/day for 3 years    Types: Cigarettes    Quit date: 03/27/1960  . Smokeless tobacco: Never Used  . Alcohol Use: No  . Drug Use: No  . Sexual Activity: Not on file   Other Topics Concern  . Not on file   Social History Narrative  . No narrative on file    Family History  Problem Relation Age of Onset  . CAD Mother     MI in 80s  . Prostate cancer Father   . Prostate cancer Brother   . Lung cancer Brother     REVIEW OF SYSTEMS:  All systems reviewed  Negative to the above problem except as noted above.    PHYSICAL EXAM: Filed Vitals:   01/05/14 1500  BP: 119/65  Pulse: 46  Temp:   Resp: 15    No intake or output data in the 24 hours ending 01/05/14 1511  General:  Well appearing. No respiratory difficulty HEENT: normal Neck: supple. no  JVD. Carotids 2+ bilat; no bruits. No lymphadenopathy or thryomegaly appreciated. Cor: PMI nondisplaced. Regular rate & rhythm. No rubs, gallops or murmurs. Lungs: clear Abdomen: soft, nontender, nondistended. No hepatosplenomegaly. No bruits or masses. Good bowel sounds. Extremities: no cyanosis, clubbing, rash, edema Neuro: alert & oriented x 3, cranial nerves grossly intact. moves all 4 extremities w/o difficulty. Affect pleasant.  ECG:  Sinus 45 bpm  Incomplete RBBB  Results for orders placed during the hospital encounter of 01/05/14 (from the past 24 hour(s))  CBC WITH DIFFERENTIAL     Status: Abnormal   Collection Time    01/05/14  1:41 PM      Result Value Ref Range   WBC 9.5  4.0 - 10.5 K/uL   RBC 4.19 (*) 4.22 - 5.81 MIL/uL   Hemoglobin 12.6 (*) 13.0 - 17.0 g/dL   HCT 37.1 (*) 39.0 - 52.0 %   MCV 88.5  78.0 - 100.0 fL   MCH 30.1  26.0 - 34.0 pg   MCHC 34.0  30.0 - 36.0 g/dL   RDW 13.7  11.5 - 15.5 %  Platelets 265  150 - 400 K/uL   Neutrophils Relative % 81 (*) 43 - 77 %   Neutro Abs 7.7  1.7 - 7.7 K/uL   Lymphocytes Relative 13  12 - 46 %   Lymphs Abs 1.2  0.7 - 4.0 K/uL   Monocytes Relative 5  3 - 12 %   Monocytes Absolute 0.5  0.1 - 1.0 K/uL   Eosinophils Relative 1  0 - 5 %   Eosinophils Absolute 0.1  0.0 - 0.7 K/uL   Basophils Relative 0  0 - 1 %   Basophils Absolute 0.0  0.0 - 0.1 K/uL  COMPREHENSIVE METABOLIC PANEL     Status: Abnormal   Collection Time    01/05/14  1:41 PM      Result Value Ref Range   Sodium 136 (*) 137 - 147 mEq/L   Potassium 4.2  3.7 - 5.3 mEq/L   Chloride 100  96 - 112 mEq/L   CO2 23  19 - 32 mEq/L   Glucose, Bld 128 (*) 70 - 99 mg/dL   BUN 22  6 - 23 mg/dL   Creatinine, Ser 1.33  0.50 - 1.35 mg/dL   Calcium 9.5  8.4 - 10.5 mg/dL   Total Protein 7.4  6.0 - 8.3 g/dL   Albumin 3.7  3.5 - 5.2 g/dL   AST 25  0 - 37 U/L   ALT 19  0 - 53 U/L   Alkaline Phosphatase 94  39 - 117 U/L   Total Bilirubin 0.2 (*) 0.3 - 1.2 mg/dL   GFR calc  non Af Amer 51 (*) >90 mL/min   GFR calc Af Amer 60 (*) >90 mL/min   Anion gap 13  5 - 15  TROPONIN I     Status: None   Collection Time    01/05/14  1:41 PM      Result Value Ref Range   Troponin I <0.30  <0.30 ng/mL  URINALYSIS, ROUTINE W REFLEX MICROSCOPIC     Status: Abnormal   Collection Time    01/05/14  2:27 PM      Result Value Ref Range   Color, Urine YELLOW  YELLOW   APPearance CLEAR  CLEAR   Specific Gravity, Urine 1.010  1.005 - 1.030   pH 6.5  5.0 - 8.0   Glucose, UA NEGATIVE  NEGATIVE mg/dL   Hgb urine dipstick NEGATIVE  NEGATIVE   Bilirubin Urine NEGATIVE  NEGATIVE   Ketones, ur NEGATIVE  NEGATIVE mg/dL   Protein, ur 30 (*) NEGATIVE mg/dL   Urobilinogen, UA 1.0  0.0 - 1.0 mg/dL   Nitrite NEGATIVE  NEGATIVE   Leukocytes, UA NEGATIVE  NEGATIVE  URINE MICROSCOPIC-ADD ON     Status: Abnormal   Collection Time    01/05/14  2:27 PM      Result Value Ref Range   Squamous Epithelial / LPF RARE  RARE   WBC, UA 0-2  <3 WBC/hpf   RBC / HPF 0-2  <3 RBC/hpf   Bacteria, UA FEW (*) RARE   Casts HYALINE CASTS (*) NEGATIVE   Dg Chest Portable 1 View  01/05/2014   CLINICAL DATA:  Bradycardia, nausea and diaphoresis.  EXAM: PORTABLE CHEST - 1 VIEW  COMPARISON:  Single view of the chest 12/30/2013. PA and lateral chest 07/21/2013.  FINDINGS: There is cardiomegaly without edema. The lungs are clear. No pneumothorax or pleural effusion.  IMPRESSION: Cardiomegaly without acute disease.   Electronically Signed   By:  Inge Rise M.D.   On: 01/05/2014 14:15     ASSESSMENT: Patient is a 73 yo who presents with bradycardia and dizziness.  BP low at times  HR 40s with no signif conduction defects Most likely amiodarone has slowed HR and he needs to back down on there meds. REC  Ambulate and follow BP  If no signif dizziness would d/c on amiodarone 200 bid until 10/22.  (Hold tonight's amio) Stop verapamil  Continue Toprol ER 50   F/U in [facility] for EKG and BP check.    2.   Pericardial effusion  Will need to be followed  I am not convinced it has become symptomatic.    [facility] office will call patient with time.

## 2014-01-05 NOTE — ED Notes (Signed)
Pt states was just admitted here on Monday for irregular heart rhythm and discharged on Thursday. Pt states today around 1130 pt felt sweaty, weak and dizzy. Wife called PCP office and was told to come to Ed.

## 2014-01-05 NOTE — Telephone Encounter (Signed)
I think it might be best for him to go to the ER to have an ECG and make sure that he is not in heart block given significant bradycardia. His heart rate was in the 70s last time I saw him, and he was on the same medications at that time. Otherwise, it may be a matter of cutting back on either Toprol-XL or the Calan SR.

## 2014-01-05 NOTE — ED Provider Notes (Signed)
CSN: [id number]     Arrival date & time 01/05/14  1241 History   First MD Initiated Contact with Patient 01/05/14 1327     Chief Complaint  Patient presents with  . Bradycardia     (Consider location/radiation/quality/duration/timing/severity/associated sxs/prior Treatment) HPI Comments: Patient presents from home after episode of near syncope, diaphoresis and bradycardia. Recent admission for atrial fibrillation with RVR and amiodarone was added. He is also on metoprolol and verapamil. He states he is working in his wood shop when he all of a sudden became dizzy and sweaty. The room was not spinning but he felt lightheaded. He had to sit down and lay down for about an hour before symptoms passed. HIs wife counted his pulse and it was 42. He was feeling fine yesterday. Denies any chest pain or shortness of breath. Denies any palpitations. Referred to ED by a cardiologist.   The history is provided by the patient.    Past Medical History  Diagnosis Date  . Pericardial effusion     Idiopathic, recurrent pericardial effusion s/p pericardial window.  . Supraventricular tachycardia   . Paroxysmal atrial fibrillation   . Temporal arteritis   . Leg fracture     Distal tibial/fibula 2003  . Prostate cancer     Seed implants 2007   Past Surgical History  Procedure Laterality Date  . Pericardial window  December 2008   Family History  Problem Relation Age of Onset  . CAD Mother     MI in 27s  . Prostate cancer Father   . Prostate cancer Brother   . Lung cancer Brother    History  Substance Use Topics  . Smoking status: Former Smoker -- 0.30 packs/day for 3 years    Types: Cigarettes    Quit date: 03/27/1960  . Smokeless tobacco: Never Used  . Alcohol Use: No    Review of Systems  Constitutional: Negative for fever, activity change and appetite change.  HENT: Negative for congestion and rhinorrhea.   Respiratory: Positive for chest tightness. Negative for cough and shortness  of breath.   Cardiovascular: Negative for chest pain.  Gastrointestinal: Negative for nausea, vomiting and abdominal pain.  Genitourinary: Negative for dysuria, urgency and hematuria.  Musculoskeletal: Negative for arthralgias and myalgias.  Skin: Negative for wound.  Neurological: Positive for dizziness and light-headedness. Negative for weakness and headaches.  A complete 10 system review of systems was obtained and all systems are negative except as noted in the HPI and PMH.      Allergies  Review of patient's allergies indicates no known allergies.  Home Medications   Prior to Admission medications   Medication Sig Start Date End Date Taking? Authorizing Provider  acetaminophen (TYLENOL) 500 MG tablet Take 1,000 mg by mouth every 6 (six) hours as needed for moderate pain.   Yes Historical Provider, MD  allopurinol (ZYLOPRIM) 300 MG tablet Take 450 mg by mouth daily.    Yes Historical Provider, MD  amiodarone (PACERONE) 200 MG tablet Take 1 tab twice daily for 14 days then take 1 tab daily. 01/01/14  Yes Radene Gunning, NP  aspirin 81 MG tablet Take 81 mg by mouth daily.   Yes Historical Provider, MD  benazepril (LOTENSIN) 20 MG tablet Take 20 mg by mouth daily.   Yes Historical Provider, MD  chlorpheniramine-HYDROcodone (TUSSIONEX) 10-8 MG/5ML LQCR Take 2.5 mLs by mouth 2 (two) times daily. 01/02/14  Yes Historical Provider, MD  chlorthalidone (HYGROTON) 25 MG tablet Take 1 tablet by mouth  daily. 12/12/13  Yes Historical Provider, MD  fluticasone (FLONASE) 50 MCG/ACT nasal spray Place 2 sprays into the nose as needed for allergies or rhinitis.    Yes Historical Provider, MD  metoprolol succinate (TOPROL-XL) 50 MG 24 hr tablet Take 1 tablet (50 mg total) by mouth daily. Take with or immediately following a meal. 09/08/13  Yes Satira Sark, MD  Multiple Vitamin (MULTIVITAMIN) tablet Take 1 tablet by mouth daily.   Yes Historical Provider, MD  pantoprazole (PROTONIX) 40 MG tablet Take 1  tablet (40 mg total) by mouth daily. 09/08/13  Yes Satira Sark, MD  verapamil (CALAN-SR) 240 MG CR tablet Take 240 mg by mouth daily.    Yes Historical Provider, MD   BP 128/57  Pulse 44  Temp(Src) 97.9 F (36.6 C) (Oral)  Resp 10  Ht 6' (1.829 m)  Wt 232 lb (105.235 kg)  BMI 31.46 kg/m2  SpO2 96% Physical Exam  Nursing note and vitals reviewed. Constitutional: He is oriented to person, place, and time. He appears well-developed and well-nourished. No distress.  HENT:  Head: Normocephalic and atraumatic.  Mouth/Throat: Oropharynx is clear and moist. No oropharyngeal exudate.  Eyes: Conjunctivae and EOM are normal. Pupils are equal, round, and reactive to light.  Neck: Normal range of motion. Neck supple.  No meningismus.  Cardiovascular: Normal rate, normal heart sounds and intact distal pulses.   No murmur heard. bradycardia  Pulmonary/Chest: Effort normal and breath sounds normal. No respiratory distress.  Abdominal: Soft. There is no tenderness. There is no rebound and no guarding.  Musculoskeletal: Normal range of motion. He exhibits no edema and no tenderness.  Neurological: He is alert and oriented to person, place, and time. No cranial nerve deficit. He exhibits normal muscle tone. Coordination normal.  No ataxia on finger to nose bilaterally. No pronator drift. 5/5 strength throughout. CN 2-12 intact. Negative Romberg. Equal grip strength. Sensation intact. Gait is normal.   Skin: Skin is warm.  Psychiatric: He has a normal mood and affect. His behavior is normal.    ED Course  Procedures (including critical care time) Labs Review Labs Reviewed  CBC WITH DIFFERENTIAL - Abnormal; Notable for the following:    RBC 4.19 (*)    Hemoglobin 12.6 (*)    HCT 37.1 (*)    Neutrophils Relative % 81 (*)    All other components within normal limits  COMPREHENSIVE METABOLIC PANEL - Abnormal; Notable for the following:    Sodium 136 (*)    Glucose, Bld 128 (*)    Total  Bilirubin 0.2 (*)    GFR calc non Af Amer 51 (*)    GFR calc Af Amer 60 (*)    All other components within normal limits  URINALYSIS, ROUTINE W REFLEX MICROSCOPIC - Abnormal; Notable for the following:    Protein, ur 30 (*)    All other components within normal limits  URINE MICROSCOPIC-ADD ON - Abnormal; Notable for the following:    Bacteria, UA FEW (*)    Casts HYALINE CASTS (*)    All other components within normal limits  TROPONIN I  TROPONIN I    Imaging Review Dg Chest Portable 1 View  01/05/2014   CLINICAL DATA:  Bradycardia, nausea and diaphoresis.  EXAM: PORTABLE CHEST - 1 VIEW  COMPARISON:  Single view of the chest 12/30/2013. PA and lateral chest 07/21/2013.  FINDINGS: There is cardiomegaly without edema. The lungs are clear. No pneumothorax or pleural effusion.  IMPRESSION: Cardiomegaly without acute  disease.   Electronically Signed   By: Inge Rise M.D.   On: 01/05/2014 14:15     EKG Interpretation   Date/Time:  Monday January 05 2014 13:12:09 EDT Ventricular Rate:  45 PR Interval:  166 QRS Duration: 105 QT Interval:  505 QTC Calculation: 437 R Axis:   6 Text Interpretation:  Sinus bradycardia Consider left atrial enlargement  Low voltage, precordial leads Abnormal R-wave progression, early  transition Marked sinus bradycardia Confirmed by [place]  [phone]) on 01/05/2014 2:19:33 PM      MDM   Final diagnoses:  Symptomatic bradycardia   Episode of near syncope and bradycardia. EKG with sinus bradycardia, no heart block. Blood pressure and mental status stable.  Known pericardial effusion.   Likely medication related.  Labs unremarkable. Patient seen by Dr. Harrington Challenger. She recommends holding dose of amiodarone tonight. Stop verapamil indefinitely. Follow up in office this week.  Patient feeling better, ambulatory without symptoms.  Wishing to go home. No CP or SOB. Troponin negative x2.  HR remains sinus in 40s.  BP 128/57  Pulse 44   Temp(Src) 97.9 F (36.6 C) (Oral)  Resp 10  Ht 6' (1.829 m)  Wt 232 lb (105.235 kg)  BMI 31.46 kg/m2  SpO2 96%    Ezequiel Essex, MD 01/05/14 1732

## 2014-01-07 ENCOUNTER — Ambulatory Visit (INDEPENDENT_AMBULATORY_CARE_PROVIDER_SITE_OTHER): Payer: Medicare Other | Admitting: *Deleted

## 2014-01-07 VITALS — BP 125/69 | HR 54 | Ht 72.0 in | Wt 231.0 lb

## 2014-01-07 DIAGNOSIS — I480 Paroxysmal atrial fibrillation: Secondary | ICD-10-CM | POA: Diagnosis not present

## 2014-01-07 NOTE — Progress Notes (Signed)
Patient presents today for nurse visit per Dr. Harrington Challenger request at ED visit this week. EKG and vitals taken. Patient denies chest pain, sob or dizziness. Patient has taken all doses of mediations as prescribed without having any side effects. EKG reviewed by MD and advise patient to follow up next week at the [place] office. Appointment information given to patient for next Wednesday, 21st @1 :00pm with Dr. Domenic Polite.

## 2014-01-13 ENCOUNTER — Telehealth: Payer: Self-pay | Admitting: *Deleted

## 2014-01-13 DIAGNOSIS — H4011X2 Primary open-angle glaucoma, moderate stage: Secondary | ICD-10-CM | POA: Diagnosis not present

## 2014-01-13 NOTE — Telephone Encounter (Signed)
Pt is scheduled for apt with Dr Domenic Polite 01/14/14 at 1:00. He stopped taking amlodipine because he is allergic and has broke out in a rash, has not took it for two days. He is not having trouble breathing or any other symptoms. Advised patient to keep apt tomorrow and to not take amlodipine till he sees the doctor.

## 2014-01-14 ENCOUNTER — Encounter: Payer: Self-pay | Admitting: *Deleted

## 2014-01-14 ENCOUNTER — Ambulatory Visit: Payer: Medicare Other | Admitting: Cardiology

## 2014-01-14 ENCOUNTER — Encounter: Payer: Self-pay | Admitting: Cardiology

## 2014-01-14 ENCOUNTER — Ambulatory Visit (INDEPENDENT_AMBULATORY_CARE_PROVIDER_SITE_OTHER): Payer: Medicare Other | Admitting: Cardiology

## 2014-01-14 VITALS — BP 138/68 | HR 52 | Ht 72.0 in | Wt 234.0 lb

## 2014-01-14 DIAGNOSIS — I480 Paroxysmal atrial fibrillation: Secondary | ICD-10-CM

## 2014-01-14 DIAGNOSIS — I10 Essential (primary) hypertension: Secondary | ICD-10-CM

## 2014-01-14 DIAGNOSIS — I3139 Other pericardial effusion (noninflammatory): Secondary | ICD-10-CM

## 2014-01-14 DIAGNOSIS — I319 Disease of pericardium, unspecified: Secondary | ICD-10-CM

## 2014-01-14 DIAGNOSIS — I313 Pericardial effusion (noninflammatory): Secondary | ICD-10-CM

## 2014-01-14 DIAGNOSIS — R079 Chest pain, unspecified: Secondary | ICD-10-CM | POA: Diagnosis not present

## 2014-01-14 DIAGNOSIS — R001 Bradycardia, unspecified: Secondary | ICD-10-CM | POA: Insufficient documentation

## 2014-01-14 NOTE — Patient Instructions (Addendum)
Your physician recommends that you schedule a follow-up appointment in: 2-3 weeks with Dr. Domenic Polite  Your physician recommends that you continue on your current medications as directed. Please refer to the Current Medication list given to you today.  Please monitor your heart rate.  Your physician has requested that you have a lexiscan myoview. For further information please visit [email or web address]. Please follow instruction sheet, as given.  Thank you for choosing [facility]!!

## 2014-01-14 NOTE — Assessment & Plan Note (Signed)
Currently maintaining sinus rhythm. He has an allergy to amiodarone, rash noted on examination, has already stopped the medication earlier this week. For now the plan is to continue Toprol-XL, keep an eye on heart rate to see if he needs further medication adjustments. Multaq might be a possible replacement, although fairly expensive. He does not have any history of obstructive CAD or myocardial infarction. We will obtain a Lexiscan Cardiolite to assess for ischemia, and if low risk, might consider placing him on flecainide instead. Office followup arranged. As noted above, for now he is most comfortable staying on aspirin.

## 2014-01-14 NOTE — Assessment & Plan Note (Signed)
No change in current medical regimen.

## 2014-01-14 NOTE — Progress Notes (Signed)
Clinical Summary [name] [name] is a 73 y.o.male that I last saw in August of this year. In the interim he was admitted to [facility] and seen by Dr. Bronson Ing in consultation with atrial fibrillation. He was started on amiodarone load in addition to metoprolol and verapamil, and converted to sinus rhythm. He was not anticoagulated at the time, continued on aspirin. Subsequent communication with our office found that the patient was significantly bradycardic and symptomatic with heart rates in the 40s. He was ultimately seen by Dr. Harrington Challenger in the ER, found to be in sinus bradycardia. Recommendation was to continue amiodarone at 200 mg twice daily, however stop verapamil. He was seen for nurse check in [place], ECG showed sinus bradycardia, no heart block.  He comes in today with his wife for a followup visit. He states that he had to stop amiodarone given development of a rash on his trunk, thighs and arms. He states that he had similar problems with amiodarone years ago when Dr. Dannielle Burn tried to put him on it. Other than this he feels much better, remains in sinus rhythm.  Followup echocardiogram in early October reported LVEF 65-70% with grade 2 diastolic dysfunction, sclerotic aortic valve, mildly dilated aortic root, moderate left atrial enlargement, normal IVC, moderate circumferential pericardial effusion with a maximum dimension 1.5 cm, no right atrial or right ventricular collapse with normal mitral inflow pattern, smaller in size compared to prior study in May.  CHADSVASC score is 2 at this point. Annual risk of ischemic stroke approximately 2.3% on aspirin versus 0.8% on Eliquis (annual risk of major bleeding 1.1% on aspirin versus 2.6% on Eliquis). We discussed this today, and he voiced comfort remaining on aspirin for now.  LFTs and TSH normal in October.   Allergies  Allergen Reactions  . Amiodarone     rash    Current Outpatient Prescriptions  Medication Sig Dispense Refill  .  acetaminophen (TYLENOL) 500 MG tablet Take 1,000 mg by mouth every 6 (six) hours as needed for moderate pain.      Marland Kitchen allopurinol (ZYLOPRIM) 300 MG tablet Take 450 mg by mouth daily.       Marland Kitchen aspirin 81 MG tablet Take 81 mg by mouth daily.      . benazepril (LOTENSIN) 20 MG tablet Take 20 mg by mouth daily.      . chlorpheniramine-HYDROcodone (TUSSIONEX) 10-8 MG/5ML LQCR Take 2.5 mLs by mouth 2 (two) times daily.      . chlorthalidone (HYGROTON) 25 MG tablet Take 1 tablet by mouth daily.      . fluticasone (FLONASE) 50 MCG/ACT nasal spray Place 2 sprays into the nose as needed for allergies or rhinitis.       . metoprolol succinate (TOPROL-XL) 50 MG 24 hr tablet Take 1 tablet (50 mg total) by mouth daily. Take with or immediately following a meal.  30 tablet  6  . Multiple Vitamin (MULTIVITAMIN) tablet Take 1 tablet by mouth daily.      . pantoprazole (PROTONIX) 40 MG tablet Take 1 tablet (40 mg total) by mouth daily.  30 tablet  6   No current facility-administered medications for this visit.    Past Medical History  Diagnosis Date  . Pericardial effusion     Idiopathic, recurrent pericardial effusion s/p pericardial window.  . Supraventricular tachycardia   . Paroxysmal atrial fibrillation   . Temporal arteritis   . Leg fracture     Distal tibial/fibula 2003  . Prostate cancer  Seed implants 2007    Past Surgical History  Procedure Laterality Date  . Pericardial window  December 2008    Family History  Problem Relation Age of Onset  . CAD Mother     MI in 65s  . Prostate cancer Father   . Prostate cancer Brother   . Lung cancer Brother     Social History [name] reports that he quit smoking about 53 years ago. His smoking use included Cigarettes. He started smoking about 55 years ago. He has a .9 pack-year smoking history. He has never used smokeless tobacco. [name] reports that he does not drink alcohol.  Review of Systems Other systems reviewed and negative  except as outlined.  Physical Examination Filed Vitals:   01/14/14 1249  BP: 138/68  Pulse: 52   Filed Weights   01/14/14 1249  Weight: 234 lb (106.142 kg)    Overweight, comfortable at rest.  HEENT: Conjunctiva and lids normal, oropharynx clear.  Neck: Supple, no elevated JVP or carotid bruits, no thyromegaly.  Lungs: Clear to auscultation, nonlabored breathing at rest.  Cardiac: Regular rate and rhythm, no S3 or significant systolic murmur, no pericardial rub.  Abdomen: Soft, nontender, bowel sounds present.  Extremities: No pitting edema, distal pulses 2+.  Skin: Warm and dry. Maculopapular rash on lower back, thighs, and upper arms. Musculoskeletal: No kyphosis.  Neuropsychiatric: Alert and oriented x3, affect appropriate.   Problem List and Plan   Paroxysmal atrial fibrillation Currently maintaining sinus rhythm. He has an allergy to amiodarone, rash noted on examination, has already stopped the medication earlier this week. For now the plan is to continue Toprol-XL, keep an eye on heart rate to see if he needs further medication adjustments. Multaq might be a possible replacement, although fairly expensive. He does not have any history of obstructive CAD or myocardial infarction. We will obtain a Lexiscan Cardiolite to assess for ischemia, and if low risk, might consider placing him on flecainide instead. Office followup arranged. As noted above, for now he is most comfortable staying on aspirin.  Pericardial effusion Recently reassessed by followup echocardiogram in early October, moderate in size, circumferential, no clear evidence of hemodynamic significance. Continue observation.  Essential hypertension, benign No change in current medical regimen.  Sinus bradycardia Keep an eye on heart rate. He continues on Toprol-XL, although verapamil was discontinued recently. May need to readjust.    Satira Sark, M.D., F.A.C.C.

## 2014-01-14 NOTE — Assessment & Plan Note (Signed)
Keep an eye on heart rate. He continues on Toprol-XL, although verapamil was discontinued recently. May need to readjust.

## 2014-01-14 NOTE — Assessment & Plan Note (Signed)
Recently reassessed by followup echocardiogram in early October, moderate in size, circumferential, no clear evidence of hemodynamic significance. Continue observation. 

## 2014-01-16 ENCOUNTER — Other Ambulatory Visit: Payer: Self-pay | Admitting: *Deleted

## 2014-01-16 MED ORDER — CHLORTHALIDONE 25 MG PO TABS: 25.0000 mg | ORAL_TABLET | Freq: Every day | ORAL | Status: DC

## 2014-01-20 ENCOUNTER — Encounter (HOSPITAL_COMMUNITY): Payer: Self-pay

## 2014-01-20 ENCOUNTER — Encounter (HOSPITAL_COMMUNITY)
Admission: RE | Admit: 2014-01-20 | Discharge: 2014-01-20 | Disposition: A | Discharge status: Home / Self Care | Payer: Medicare Other | Source: Ambulatory Visit | Attending: Cardiology | Admitting: Cardiology

## 2014-01-20 ENCOUNTER — Ambulatory Visit (HOSPITAL_COMMUNITY)
Admission: RE | Admit: 2014-01-20 | Discharge: 2014-01-20 | Disposition: A | Discharge status: Home / Self Care | Payer: Medicare Other | Source: Ambulatory Visit | Attending: Cardiology | Admitting: Cardiology

## 2014-01-20 DIAGNOSIS — R001 Bradycardia, unspecified: Secondary | ICD-10-CM | POA: Insufficient documentation

## 2014-01-20 DIAGNOSIS — R079 Chest pain, unspecified: Secondary | ICD-10-CM | POA: Insufficient documentation

## 2014-01-20 DIAGNOSIS — I4891 Unspecified atrial fibrillation: Secondary | ICD-10-CM | POA: Insufficient documentation

## 2014-01-20 MED ORDER — REGADENOSON 0.4 MG/5ML IV SOLN
INTRAVENOUS | Status: AC
  Administered 2014-01-20: 0.4 mg via INTRAVENOUS
  Filled 2014-01-20: qty 5

## 2014-01-20 MED ORDER — TECHNETIUM TC 99M SESTAMIBI - CARDIOLITE
30.0000 | Freq: Once | INTRAVENOUS | Status: AC | PRN
  Administered 2014-01-20: 10:00:00 30 via INTRAVENOUS

## 2014-01-20 MED ORDER — REGADENOSON 0.4 MG/5ML IV SOLN
0.4000 mg | Freq: Once | INTRAVENOUS | Status: AC | PRN
  Administered 2014-01-20: 0.4 mg via INTRAVENOUS

## 2014-01-20 MED ORDER — TECHNETIUM TC 99M SESTAMIBI GENERIC - CARDIOLITE
10.0000 | Freq: Once | INTRAVENOUS | Status: AC | PRN
  Administered 2014-01-20: 10 via INTRAVENOUS

## 2014-01-20 MED ORDER — SODIUM CHLORIDE 0.9 % IJ SOLN
INTRAMUSCULAR | Status: AC
  Administered 2014-01-20: 10 mL via INTRAVENOUS
  Filled 2014-01-20: qty 10

## 2014-01-20 MED ORDER — SODIUM CHLORIDE 0.9 % IJ SOLN
10.0000 mL | INTRAMUSCULAR | Status: DC | PRN
  Administered 2014-01-20: 10 mL via INTRAVENOUS

## 2014-01-20 NOTE — Progress Notes (Signed)
Stress Lab Nurses Notes - [name] [name] 01/20/2014 Reason for doing test: PAFib & Bradycardia Type of test: Wille Glaser Nurse performing test: Gerrit Halls, RN Nuclear Medicine Tech: Redmond Baseman Echo Tech: Not Applicable MD performing test: S. McDowell/K.Purcell Nails NP Family MD: Maryla Morrow explained and consent signed: Yes.   IV started: Saline lock flushed, No redness or edema and Saline lock started in radiology Symptoms: none Treatment/Intervention: None Reason test stopped: protocol completed After recovery IV was: Discontinued via X-ray tech and No redness or edema Patient to return to Nuc. Med at : 10:45 Patient discharged: Home Patient's Condition upon discharge was: stable Comments: During test BP 148/62 & HR 75.  Recovery BP 122/60 & HR 63.  Symptoms resolved in recovery. Geanie Cooley T

## 2014-01-21 ENCOUNTER — Encounter: Payer: Self-pay | Admitting: Cardiology

## 2014-01-21 ENCOUNTER — Telehealth: Payer: Self-pay | Admitting: *Deleted

## 2014-01-21 DIAGNOSIS — I480 Paroxysmal atrial fibrillation: Secondary | ICD-10-CM

## 2014-01-21 DIAGNOSIS — R001 Bradycardia, unspecified: Secondary | ICD-10-CM

## 2014-01-21 DIAGNOSIS — I471 Supraventricular tachycardia: Secondary | ICD-10-CM

## 2014-01-21 NOTE — Telephone Encounter (Signed)
Message copied by Merlene Laughter on Wed Jan 21, 2014  2:41 PM ------      Message from: Satira Sark      Created: Tue Jan 20, 2014  1:07 PM       Reviewed report. Please let him know that the stress test was normal. This opens up other options for antiarrhythmic treatment of his paroxysmal atrial fibrillation. We did check into Multaq cost, and it is actually not very expensive. This might be an option, although with definite amiodarone allergy, with plan to initiate flecainide 50 mg twice daily insead, continue his Toprol-XL. He should have a GXT on full medical therapy in about 2 weeks to exclude proarrhythmia. ------

## 2014-01-28 ENCOUNTER — Encounter: Payer: Self-pay | Admitting: *Deleted

## 2014-01-28 ENCOUNTER — Encounter: Payer: Self-pay | Admitting: Cardiology

## 2014-01-28 ENCOUNTER — Ambulatory Visit (INDEPENDENT_AMBULATORY_CARE_PROVIDER_SITE_OTHER): Payer: Medicare Other | Admitting: Cardiology

## 2014-01-28 VITALS — BP 120/67 | HR 63 | Ht 72.0 in | Wt 233.0 lb

## 2014-01-28 DIAGNOSIS — I10 Essential (primary) hypertension: Secondary | ICD-10-CM | POA: Diagnosis not present

## 2014-01-28 DIAGNOSIS — I480 Paroxysmal atrial fibrillation: Secondary | ICD-10-CM

## 2014-01-28 MED ORDER — FLECAINIDE ACETATE 50 MG PO TABS: 50.0000 mg | ORAL_TABLET | Freq: Two times a day (BID) | ORAL | Status: DC

## 2014-01-28 MED ORDER — METOPROLOL TARTRATE 25 MG PO TABS: 25.0000 mg | ORAL_TABLET | Freq: Two times a day (BID) | ORAL | Status: DC

## 2014-01-28 NOTE — Patient Instructions (Signed)
Your physician recommends that you schedule a follow-up appointment in: 6 weeks. Your physician has recommended you make the following change in your medication:  Start flecainide 50 mg twice daily in the next 1-2 weeks after your rash has completely cleared up. Call our office when you start this new medications. Please call our office so that we can arrange for your GXT to be rescheduled for 2 weeks of starting flecainide.  Continue all other medications the same.

## 2014-01-28 NOTE — Assessment & Plan Note (Signed)
Would like for his rash to resolve from recent amiodarone prior to initiating flecainide 50 mg twice daily. Once this occurs, likely within the next week or 2, we will then plan a follow-up GXT after 2 weeks of being on flecainide. This GXT can be done on beta blocker. We will continue aspirin for now, not pursuing anticoagulation as has been already reviewed. Follow-up in 6 weeks.

## 2014-01-28 NOTE — Assessment & Plan Note (Signed)
Blood pressure is normal today. 

## 2014-01-28 NOTE — Progress Notes (Signed)
Reason for visit: Atrial fibrillation, pericardial effusion  Clinical Summary [name] [name] is a 73 y.o.male seen recently in October. Recent historical details are reviewed in the last note including documentation of amiodarone allergy.  He underwent a Lexiscan Cardiolite study in October showing no evidence of scar or ischemia with LVEF 64%. We did discuss flecainide beginning at 50 mg twice daily for rhythm suppression, although this has not yet been started.  He states that he has had no major breakthrough palpitations as yet. Rash from amiodarone has not yet resolved, but is improving steadily.  CHADSVASC score is 2 at this point. Annual risk of ischemic stroke approximately 2.3% on aspirin versus 0.8% on Eliquis (annual risk of major bleeding 1.1% on aspirin versus 2.6% on Eliquis). We discussed this today, and he voiced comfort remaining on aspirin for now.  Allergies  Allergen Reactions  . Amiodarone     rash    Current Outpatient Prescriptions  Medication Sig Dispense Refill  . acetaminophen (TYLENOL) 500 MG tablet Take 1,000 mg by mouth every 6 (six) hours as needed for moderate pain.    Marland Kitchen allopurinol (ZYLOPRIM) 300 MG tablet Take 450 mg by mouth daily.     Marland Kitchen aspirin 81 MG tablet Take 81 mg by mouth daily.    . benazepril (LOTENSIN) 20 MG tablet Take 20 mg by mouth daily.    . chlorpheniramine-HYDROcodone (TUSSIONEX) 10-8 MG/5ML LQCR Take 2.5 mLs by mouth 2 (two) times daily.    . chlorthalidone (HYGROTON) 25 MG tablet Take 1 tablet (25 mg total) by mouth daily. 30 tablet 6  . fluticasone (FLONASE) 50 MCG/ACT nasal spray Place 2 sprays into the nose as needed for allergies or rhinitis.     . metoprolol succinate (TOPROL-XL) 50 MG 24 hr tablet Take 1 tablet (50 mg total) by mouth daily. Take with or immediately following a meal. 30 tablet 6  . Multiple Vitamin (MULTIVITAMIN) tablet Take 1 tablet by mouth daily.    . pantoprazole (PROTONIX) 40 MG tablet Take 1 tablet (40 mg total)  by mouth daily. 30 tablet 6  . flecainide (TAMBOCOR) 50 MG tablet Take 1 tablet (50 mg total) by mouth 2 (two) times daily. 180 tablet 3  . metoprolol tartrate (LOPRESSOR) 25 MG tablet Take 1 tablet (25 mg total) by mouth 2 (two) times daily. 15 tablet 0   No current facility-administered medications for this visit.    Past Medical History  Diagnosis Date  . Pericardial effusion     Idiopathic, recurrent pericardial effusion s/p pericardial window.  . Supraventricular tachycardia   . Paroxysmal atrial fibrillation   . Temporal arteritis   . Leg fracture     Distal tibial/fibula 2003  . Prostate cancer     Seed implants 2007    Past Surgical History  Procedure Laterality Date  . Pericardial window  December 2008    Social History [name] [name] reports that he quit smoking about 53 years ago. His smoking use included Cigarettes. He started smoking about 55 years ago. He has a .9 pack-year smoking history. He has never used smokeless tobacco. [name] [name] reports that he does not drink alcohol.  Review of Systems Complete review of systems negative except as otherwise outlined in the clinical summary.  Physical Examination Filed Vitals:   01/28/14 1346  BP: 120/67  Pulse: 63   Filed Weights   01/28/14 1346  Weight: 233 lb (105.688 kg)    Overweight, comfortable at rest.  HEENT: Conjunctiva and lids  normal, oropharynx clear.  Neck: Supple, no elevated JVP or carotid bruits, no thyromegaly.  Lungs: Clear to auscultation, nonlabored breathing at rest.  Cardiac: Regular rate and rhythm, no S3 or significant systolic murmur, no pericardial rub.  Abdomen: Soft, nontender, bowel sounds present.  Extremities: No pitting edema, distal pulses 2+.    Problem List and Plan   Paroxysmal atrial fibrillation Would like for his rash to resolve from recent amiodarone prior to initiating flecainide 50 mg twice daily. Once this occurs, likely within the next week or 2, we will then  plan a follow-up GXT after 2 weeks of being on flecainide. This GXT can be done on beta blocker. We will continue aspirin for now, not pursuing anticoagulation as has been already reviewed. Follow-up in 6 weeks.  Essential hypertension, benign Blood pressure is normal today.    Satira Sark, M.D., F.A.C.C.

## 2014-01-29 DIAGNOSIS — H4011X1 Primary open-angle glaucoma, mild stage: Secondary | ICD-10-CM | POA: Diagnosis not present

## 2014-01-30 ENCOUNTER — Telehealth: Payer: Self-pay | Admitting: Cardiology

## 2014-01-30 MED ORDER — METOPROLOL TARTRATE 25 MG PO TABS: 25.0000 mg | ORAL_TABLET | Freq: Two times a day (BID) | ORAL | Status: DC

## 2014-01-30 NOTE — Telephone Encounter (Signed)
metoprolol tartrate (LOPRESSOR) 25 MG tablet  Medication   [facility] does not have RX.  Patient stated that he was told it would be sent

## 2014-01-30 NOTE — Telephone Encounter (Signed)
Call placed to [facility].  Stated they did get the Flecainide that was sent on 01/28/14, but did not receive the Metoprolol.  Verbal order given.    Patient notified.

## 2014-02-03 ENCOUNTER — Telehealth: Payer: Self-pay | Admitting: Cardiology

## 2014-02-03 NOTE — Telephone Encounter (Signed)
[name] [name] still has rash with amiodarone allergy. Patient is wanting to know when he is suppose to start new medication.

## 2014-02-04 ENCOUNTER — Encounter (HOSPITAL_COMMUNITY): Payer: Medicare Other

## 2014-02-04 NOTE — Telephone Encounter (Signed)
Patient informed that per office note, it may take about 2 weeks to clear up. Patient instructed to call next week with status of rash.

## 2014-02-12 ENCOUNTER — Encounter (HOSPITAL_COMMUNITY): Payer: Medicare Other

## 2014-03-05 ENCOUNTER — Telehealth: Payer: Self-pay | Admitting: *Deleted

## 2014-03-06 NOTE — Telephone Encounter (Signed)
Will forward to scheduler GXT in 2 weeks.

## 2014-03-06 NOTE — Telephone Encounter (Signed)
Patient's rash cleared up but he never started the flecainide because he said he felt great. Patient is scheduled for f/u on 03/16/14. Stress test never set up.

## 2014-03-06 NOTE — Telephone Encounter (Signed)
We are still in the same boat however. He may be feeling well when he is in sinus rhythm, but if atrial fibrillation recurs, we will still have to figure out how to manage it. If he is in agreement, would go ahead and start the flecainide as we had planned, and then do a GXT 2 weeks after being on the medication. Otherwise, we can discuss it again at his office follow-up.

## 2014-03-06 NOTE — Telephone Encounter (Signed)
Patient informed and agrees to starting the flecainide. Patient's appointment for 03/16/14 will be canceled by [facility] and rescheduled. Also GXT will be scheduled for 2 weeks after starting flecainide which will be after Christmas. Patient agrees to start flecainide tomorrow.

## 2014-03-12 DIAGNOSIS — H4011X1 Primary open-angle glaucoma, mild stage: Secondary | ICD-10-CM | POA: Diagnosis not present

## 2014-03-16 ENCOUNTER — Ambulatory Visit: Payer: Medicare Other | Admitting: Cardiology

## 2014-03-23 ENCOUNTER — Encounter (HOSPITAL_COMMUNITY): Payer: Self-pay

## 2014-03-23 ENCOUNTER — Ambulatory Visit (HOSPITAL_COMMUNITY)
Admission: RE | Admit: 2014-03-23 | Discharge: 2014-03-23 | Disposition: A | Discharge status: Home / Self Care | Payer: Medicare Other | Source: Ambulatory Visit | Attending: Cardiology | Admitting: Cardiology

## 2014-03-23 DIAGNOSIS — R0602 Shortness of breath: Secondary | ICD-10-CM | POA: Insufficient documentation

## 2014-03-23 DIAGNOSIS — R072 Precordial pain: Secondary | ICD-10-CM

## 2014-03-23 DIAGNOSIS — I4891 Unspecified atrial fibrillation: Secondary | ICD-10-CM

## 2014-03-23 DIAGNOSIS — R5383 Other fatigue: Secondary | ICD-10-CM | POA: Insufficient documentation

## 2014-03-23 NOTE — Progress Notes (Addendum)
Stress Lab Nurses Notes - [name] [name] 03/23/2014 Reason for doing test: Afib & started Flecainide 2 weeks ago.  Type of test: Regular GTX Nurse performing test: Gerrit Halls, RN Nuclear Medicine Tech: Not Applicable Echo Tech: Not Applicable MD performing test: Ross/K.Purcell Nails NP Family MD: Maryla Morrow explained and consent signed: Yes.   IV started: No IV started Symptoms: SOB Treatment/Intervention: None Reason test stopped: fatigue and SOB After recovery IV was: NA Patient to return to Nuc. Med at : NA Patient discharged: Home Patient's Condition upon discharge was: stable Comments: During test Peak BP 196/95 & HR 133.  Recovery BP 142/85 & HR 76.  Symptoms resolved in recovery.  Geanie Cooley T   Baseline EKG  SR 70 bpm Incomplete RBBB.  Patient exercised to a peak HR of 133 (90% predicted maximal)  Experienced no CP WIth exercise there were no ST changes to suggest ischemia Impression:  CLinically and electrically negative for ischemia

## 2014-03-31 ENCOUNTER — Encounter: Payer: Self-pay | Admitting: Cardiology

## 2014-03-31 ENCOUNTER — Ambulatory Visit (INDEPENDENT_AMBULATORY_CARE_PROVIDER_SITE_OTHER): Payer: Medicare Other | Admitting: Cardiology

## 2014-03-31 VITALS — BP 107/62 | HR 60 | Ht 72.0 in | Wt 237.0 lb

## 2014-03-31 DIAGNOSIS — I319 Disease of pericardium, unspecified: Secondary | ICD-10-CM

## 2014-03-31 DIAGNOSIS — I3139 Other pericardial effusion (noninflammatory): Secondary | ICD-10-CM

## 2014-03-31 DIAGNOSIS — I313 Pericardial effusion (noninflammatory): Secondary | ICD-10-CM

## 2014-03-31 DIAGNOSIS — I480 Paroxysmal atrial fibrillation: Secondary | ICD-10-CM

## 2014-03-31 NOTE — Progress Notes (Signed)
Reason for visit: Atrial fibrillation  Clinical Summary [name] [name] is a 74 y.o.male last seen in November 2015. Since that time he has been initiated on flecainide, and had a recent follow-up GXT that was negative for proarrhythmia.  He is here with his wife today, doing well without significant palpitations. He has tolerated flecainide.  CHADSVASC score is 2 at this point. Annual risk of ischemic stroke approximately 2.3% on aspirin versus 0.8% on Eliquis (annual risk of major bleeding 1.1% on aspirin versus 2.6% on Eliquis). We have discussed this and he voiced comfort remaining on aspirin.  He underwent a Lexiscan Cardiolite study in October showing no evidence of scar or ischemia with LVEF 64%.   Allergies  Allergen Reactions  . Amiodarone     rash    Current Outpatient Prescriptions  Medication Sig Dispense Refill  . acetaminophen (TYLENOL) 500 MG tablet Take 1,000 mg by mouth every 6 (six) hours as needed for moderate pain.    Marland Kitchen allopurinol (ZYLOPRIM) 300 MG tablet Take 450 mg by mouth daily.     Marland Kitchen aspirin 81 MG tablet Take 81 mg by mouth daily.    . benazepril (LOTENSIN) 20 MG tablet Take 20 mg by mouth daily.    . chlorpheniramine-HYDROcodone (TUSSIONEX) 10-8 MG/5ML LQCR Take 2.5 mLs by mouth 2 (two) times daily.    . chlorthalidone (HYGROTON) 25 MG tablet Take 1 tablet (25 mg total) by mouth daily. 30 tablet 6  . flecainide (TAMBOCOR) 50 MG tablet Take 1 tablet (50 mg total) by mouth 2 (two) times daily. 180 tablet 3  . fluticasone (FLONASE) 50 MCG/ACT nasal spray Place 2 sprays into the nose as needed for allergies or rhinitis.     . metoprolol succinate (TOPROL-XL) 50 MG 24 hr tablet Take 1 tablet (50 mg total) by mouth daily. Take with or immediately following a meal. 30 tablet 6  . metoprolol tartrate (LOPRESSOR) 25 MG tablet Take 1 tablet (25 mg total) by mouth 2 (two) times daily. 15 tablet 0  . Multiple Vitamin (MULTIVITAMIN) tablet Take 1 tablet by mouth daily.    .  pantoprazole (PROTONIX) 40 MG tablet Take 1 tablet (40 mg total) by mouth daily. 30 tablet 6   No current facility-administered medications for this visit.    Past Medical History  Diagnosis Date  . Pericardial effusion     Idiopathic, recurrent pericardial effusion s/p pericardial window.  . Supraventricular tachycardia   . Paroxysmal atrial fibrillation   . Temporal arteritis   . Leg fracture     Distal tibial/fibula 2003  . Prostate cancer     Seed implants 2007    Past Surgical History  Procedure Laterality Date  . Pericardial window  December 2008    Social History [name] [name] reports that he quit smoking about 54 years ago. His smoking use included Cigarettes. He started smoking about 55 years ago. He has a .9 pack-year smoking history. He has never used smokeless tobacco. [name] [name] reports that he does not drink alcohol.  Review of Systems Complete review of systems negative except as otherwise outlined in the clinical summary and also the following.  No falls or syncope. No chest pain.  Physical Examination Filed Vitals:   03/31/14 1403  BP: 107/62  Pulse: 60   Filed Weights   03/31/14 1403  Weight: 237 lb (107.502 kg)    Overweight, comfortable at rest.  HEENT: Conjunctiva and lids normal, oropharynx clear.  Neck: Supple, no elevated JVP or carotid  bruits, no thyromegaly.  Lungs: Clear to auscultation, nonlabored breathing at rest.  Cardiac: Regular rate and rhythm, no S3 or significant systolic murmur, no pericardial rub.  Abdomen: Soft, nontender, bowel sounds present.  Extremities: No pitting edema, distal pulses 2+.    Problem List and Plan   Paroxysmal atrial fibrillation Currently controlled on flecainide and beta blocker. Recent GXT noted. He prefers to stay on aspirin rather than anticoagulant. Follow-up arranged in 4 months.  Pericardial effusion Recently reassessed by followup echocardiogram in early October, moderate in size,  circumferential, no clear evidence of hemodynamic significance. Continue observation.    Satira Sark, M.D., F.A.C.C.

## 2014-03-31 NOTE — Assessment & Plan Note (Signed)
Recently reassessed by followup echocardiogram in early October, moderate in size, circumferential, no clear evidence of hemodynamic significance. Continue observation.

## 2014-03-31 NOTE — Assessment & Plan Note (Signed)
Currently controlled on flecainide and beta blocker. Recent GXT noted. He prefers to stay on aspirin rather than anticoagulant. Follow-up arranged in 4 months.

## 2014-03-31 NOTE — Patient Instructions (Signed)
Your physician recommends that you schedule a follow-up appointment in: 4 MONTHS WITH DR. MCDOWELL  Your physician recommends that you continue on your current medications as directed. Please refer to the Current Medication list given to you today.  Thank you for choosing [facility]!!    

## 2014-04-21 ENCOUNTER — Other Ambulatory Visit: Payer: Self-pay | Admitting: *Deleted

## 2014-04-21 MED ORDER — METOPROLOL SUCCINATE ER 50 MG PO TB24: 50.0000 mg | ORAL_TABLET | Freq: Every day | ORAL | Status: DC

## 2014-04-21 MED ORDER — PANTOPRAZOLE SODIUM 40 MG PO TBEC: 40.0000 mg | DELAYED_RELEASE_TABLET | Freq: Every day | ORAL | Status: DC

## 2014-04-22 DIAGNOSIS — I10 Essential (primary) hypertension: Secondary | ICD-10-CM | POA: Diagnosis not present

## 2014-04-22 DIAGNOSIS — C61 Malignant neoplasm of prostate: Secondary | ICD-10-CM | POA: Diagnosis not present

## 2014-04-22 DIAGNOSIS — E1165 Type 2 diabetes mellitus with hyperglycemia: Secondary | ICD-10-CM | POA: Diagnosis not present

## 2014-04-22 DIAGNOSIS — E782 Mixed hyperlipidemia: Secondary | ICD-10-CM | POA: Diagnosis not present

## 2014-04-22 DIAGNOSIS — I483 Typical atrial flutter: Secondary | ICD-10-CM | POA: Diagnosis not present

## 2014-04-24 DIAGNOSIS — I10 Essential (primary) hypertension: Secondary | ICD-10-CM | POA: Diagnosis not present

## 2014-04-24 DIAGNOSIS — E1165 Type 2 diabetes mellitus with hyperglycemia: Secondary | ICD-10-CM | POA: Diagnosis not present

## 2014-04-24 DIAGNOSIS — Z9189 Other specified personal risk factors, not elsewhere classified: Secondary | ICD-10-CM | POA: Diagnosis not present

## 2014-04-24 DIAGNOSIS — E782 Mixed hyperlipidemia: Secondary | ICD-10-CM | POA: Diagnosis not present

## 2014-04-24 DIAGNOSIS — G2589 Other specified extrapyramidal and movement disorders: Secondary | ICD-10-CM | POA: Diagnosis not present

## 2014-04-24 DIAGNOSIS — C61 Malignant neoplasm of prostate: Secondary | ICD-10-CM | POA: Diagnosis not present

## 2014-04-24 DIAGNOSIS — M1000 Idiopathic gout, unspecified site: Secondary | ICD-10-CM | POA: Diagnosis not present

## 2014-04-24 DIAGNOSIS — J301 Allergic rhinitis due to pollen: Secondary | ICD-10-CM | POA: Diagnosis not present

## 2014-04-24 DIAGNOSIS — Z1389 Encounter for screening for other disorder: Secondary | ICD-10-CM | POA: Diagnosis not present

## 2014-05-12 DIAGNOSIS — J019 Acute sinusitis, unspecified: Secondary | ICD-10-CM | POA: Diagnosis not present

## 2014-05-15 DIAGNOSIS — H4011X1 Primary open-angle glaucoma, mild stage: Secondary | ICD-10-CM | POA: Diagnosis not present

## 2014-06-12 DIAGNOSIS — H4011X1 Primary open-angle glaucoma, mild stage: Secondary | ICD-10-CM | POA: Diagnosis not present

## 2014-07-15 DIAGNOSIS — I10 Essential (primary) hypertension: Secondary | ICD-10-CM | POA: Diagnosis not present

## 2014-07-15 DIAGNOSIS — E1165 Type 2 diabetes mellitus with hyperglycemia: Secondary | ICD-10-CM | POA: Diagnosis not present

## 2014-07-15 DIAGNOSIS — E782 Mixed hyperlipidemia: Secondary | ICD-10-CM | POA: Diagnosis not present

## 2014-07-22 DIAGNOSIS — J301 Allergic rhinitis due to pollen: Secondary | ICD-10-CM | POA: Diagnosis not present

## 2014-07-22 DIAGNOSIS — G2581 Restless legs syndrome: Secondary | ICD-10-CM | POA: Diagnosis not present

## 2014-07-22 DIAGNOSIS — I480 Paroxysmal atrial fibrillation: Secondary | ICD-10-CM | POA: Diagnosis not present

## 2014-07-22 DIAGNOSIS — E1165 Type 2 diabetes mellitus with hyperglycemia: Secondary | ICD-10-CM | POA: Diagnosis not present

## 2014-07-22 DIAGNOSIS — M1000 Idiopathic gout, unspecified site: Secondary | ICD-10-CM | POA: Diagnosis not present

## 2014-07-22 DIAGNOSIS — E782 Mixed hyperlipidemia: Secondary | ICD-10-CM | POA: Diagnosis not present

## 2014-07-22 DIAGNOSIS — I10 Essential (primary) hypertension: Secondary | ICD-10-CM | POA: Diagnosis not present

## 2014-07-22 DIAGNOSIS — C61 Malignant neoplasm of prostate: Secondary | ICD-10-CM | POA: Diagnosis not present

## 2014-07-27 DIAGNOSIS — L570 Actinic keratosis: Secondary | ICD-10-CM | POA: Diagnosis not present

## 2014-08-14 DIAGNOSIS — H4011X1 Primary open-angle glaucoma, mild stage: Secondary | ICD-10-CM | POA: Diagnosis not present

## 2014-08-19 ENCOUNTER — Other Ambulatory Visit: Payer: Self-pay | Admitting: *Deleted

## 2014-08-19 MED ORDER — CHLORTHALIDONE 25 MG PO TABS: 25.0000 mg | ORAL_TABLET | Freq: Every day | ORAL | Status: DC

## 2014-08-31 DIAGNOSIS — Z8546 Personal history of malignant neoplasm of prostate: Secondary | ICD-10-CM | POA: Diagnosis not present

## 2014-08-31 DIAGNOSIS — C61 Malignant neoplasm of prostate: Secondary | ICD-10-CM | POA: Diagnosis not present

## 2014-09-03 NOTE — Patient Outreach (Signed)
[facility]) Care Management  09/03/2014  [name] [name] 1940-08-04 [id number]   Received referral from MD, assigned [name] Larsen, RN.  [name] [name]. [place], [place] Management [place] Assistant Phone: [phone] Fax: [phone]

## 2014-09-04 ENCOUNTER — Encounter: Payer: Self-pay | Admitting: *Deleted

## 2014-09-04 ENCOUNTER — Other Ambulatory Visit: Payer: Self-pay | Admitting: *Deleted

## 2014-09-04 NOTE — Patient Outreach (Signed)
09/04/14-  Referral received from primary care MD,  First attempt telephonic outreach to pt, HIPAA verfied,  [facility] program explained to pt,  Pt states " I'm doing really good and I don't need anything"  Pt not interested in [facility] program,  Pt does state he has all medications and can afford, pt still drives, has no problems getting to appointments, has had no falls, appetite good, has lost 25 pounds "on purpose cause I needed to" ,  Blood pressure "good" and pt checks several times per month, pt states he does not have formal diagnosis of diabetes.  Pt states he has no further issues to discuss and appreciative of call. In Basket sent to Lurline Del to close case. Letter faxed to Dr. [name] reporting pt refused services.  Jacqlyn Larsen [facility], [place] Coordinator [phone]

## 2014-09-07 ENCOUNTER — Telehealth: Payer: Self-pay | Admitting: Cardiology

## 2014-09-07 NOTE — Telephone Encounter (Signed)
Patient informed nurse that around 3:00 am this morning, he experienced a sharp pain on the left side of his chest that lasted about 20 minutes. Patient rated the pain #4 on scale of 1-10 (10 being the greatest). Patient said the area feels a little sore. No c/o sob or dizziness. Patient doesn't have nitroglycerin at home. Patient said he felt fine now with no more chest pain since early this morning. Patient was offered an appointment at the [facility] office with an extender this week and informed that if his symptoms got worse that he needed to proceed to the ED for an evaluation.  Patient has been scheduled to see Domenic Polite on 10/07/14 but declined an appointment in [place] this week. Patient verbalized understanding of plan.

## 2014-09-07 NOTE — Telephone Encounter (Signed)
[name] [name] called requesting an appointment to see Dr. Domenic Polite. States that he had  a sharp pain in his chest around 3AM. The area is very sore now.  He is concerned Please call him at home # .

## 2014-09-07 NOTE — Patient Outreach (Signed)
[facility]) Care Management  09/07/2014  [name] [name] Aug 29, 1940 [id number]   Notification from Jacqlyn Larsen, RN to close case as patient refused to participate in [place] Management services.  [name]. [place], [place] Management [place] Assistant Phone: [phone] Fax: [phone]

## 2014-10-07 ENCOUNTER — Encounter: Payer: Self-pay | Admitting: Cardiology

## 2014-10-07 ENCOUNTER — Ambulatory Visit (INDEPENDENT_AMBULATORY_CARE_PROVIDER_SITE_OTHER): Payer: Medicare Other | Admitting: Cardiology

## 2014-10-07 VITALS — BP 103/57 | HR 54 | Ht 72.0 in | Wt 212.8 lb

## 2014-10-07 DIAGNOSIS — I319 Disease of pericardium, unspecified: Secondary | ICD-10-CM | POA: Diagnosis not present

## 2014-10-07 DIAGNOSIS — I480 Paroxysmal atrial fibrillation: Secondary | ICD-10-CM

## 2014-10-07 DIAGNOSIS — I313 Pericardial effusion (noninflammatory): Secondary | ICD-10-CM

## 2014-10-07 DIAGNOSIS — I3139 Other pericardial effusion (noninflammatory): Secondary | ICD-10-CM

## 2014-10-07 NOTE — Progress Notes (Signed)
Cardiology Office Note  Date: 10/07/2014   ID: [name] [name], DOB February 14, 1941, MRN [id number]  PCP: Gar Ponto, MD  Primary Cardiologist: Rozann Lesches, MD   Chief Complaint  Patient presents with  . PAF  . Pericardial Effusion    History of Present Illness: [name] [name] is a 74 y.o. male last seen in January. He presents for a routine follow-up visit. He reports very good control of palpitations on current medical regimen. No unusual shortness of breath or chest pain. Remains functional in his ADLs, has been working in his garden over the summer.  CHADSVASC score is 2 at this point. Annual risk of ischemic stroke approximately 2.3% on aspirin versus 0.8% on Eliquis (annual risk of major bleeding 1.1% on aspirin versus 2.6% on Eliquis). We have discussed this and he voiced comfort remaining on aspirin.  Echocardiogram from October of last year is reviewed below.  Past Medical History  Diagnosis Date  . Pericardial effusion     Idiopathic, recurrent pericardial effusion s/p pericardial window.  . Supraventricular tachycardia   . Paroxysmal atrial fibrillation   . Temporal arteritis   . Leg fracture     Distal tibial/fibula 2003  . Prostate cancer     Seed implants 2007    Current Outpatient Prescriptions  Medication Sig Dispense Refill  . acetaminophen (TYLENOL) 500 MG tablet Take 1,000 mg by mouth every 6 (six) hours as needed for moderate pain.    Marland Kitchen allopurinol (ZYLOPRIM) 300 MG tablet Take 450 mg by mouth daily.     Marland Kitchen aspirin 81 MG tablet Take 81 mg by mouth daily.    . benazepril (LOTENSIN) 20 MG tablet Take 20 mg by mouth daily.    . chlorpheniramine-HYDROcodone (TUSSIONEX) 10-8 MG/5ML LQCR Take 2.5 mLs by mouth as needed.     . chlorthalidone (HYGROTON) 25 MG tablet Take 1 tablet (25 mg total) by mouth daily. 30 tablet 6  . flecainide (TAMBOCOR) 50 MG tablet Take 1 tablet (50 mg total) by mouth 2 (two) times daily. 180 tablet 3  . fluticasone (FLONASE) 50  MCG/ACT nasal spray Place 2 sprays into the nose as needed for allergies or rhinitis.     . metoprolol succinate (TOPROL-XL) 50 MG 24 hr tablet Take 1 tablet (50 mg total) by mouth daily. Take with or immediately following a meal. 30 tablet 6  . metoprolol tartrate (LOPRESSOR) 25 MG tablet Take 1 tablet (25 mg total) by mouth 2 (two) times daily. (Patient taking differently: Take 25 mg by mouth as needed. ) 15 tablet 0  . Multiple Vitamin (MULTIVITAMIN) tablet Take 1 tablet by mouth daily.    . pantoprazole (PROTONIX) 40 MG tablet Take 1 tablet (40 mg total) by mouth daily. (Patient taking differently: Take 40 mg by mouth every other day. ) 30 tablet 6   No current facility-administered medications for this visit.    Allergies:  Amiodarone   Social History: The patient  reports that he quit smoking about 54 years ago. His smoking use included Cigarettes. He started smoking about 56 years ago. He has a .9 pack-year smoking history. He has never used smokeless tobacco. He reports that he does not drink alcohol or use illicit drugs.   ROS:  Please see the history of present illness. Otherwise, complete review of systems is positive for none.  All other systems are reviewed and negative.   Physical Exam: VS:  BP 103/57 mmHg  Pulse 54  Ht 6' (1.829 m)  Wt 212 lb 12.8 oz (96.525 kg)  BMI 28.85 kg/m2  SpO2 96%, BMI Body mass index is 28.85 kg/(m^2).  Wt Readings from Last 3 Encounters:  10/07/14 212 lb 12.8 oz (96.525 kg)  03/31/14 237 lb (107.502 kg)  01/28/14 233 lb (105.688 kg)     Overweight, comfortable at rest.  HEENT: Conjunctiva and lids normal, oropharynx clear.  Neck: Supple, no elevated JVP or carotid bruits, no thyromegaly.  Lungs: Clear to auscultation, nonlabored breathing at rest.  Cardiac: Regular rate and rhythm, no S3 or significant systolic murmur, no pericardial rub.  Abdomen: Soft, nontender, bowel sounds present.  Extremities: No pitting edema, distal pulses  2+.    ECG: ECG is ordered today and shows sinus bradycardia with QRS 90 ms, low voltage.   Recent Labwork: 12/30/2013: TSH 3.510 01/05/2014: ALT 19; AST 25; BUN 22; Creatinine, Ser 1.33; Hemoglobin 12.6*; Platelets 265; Potassium 4.2; Sodium 136*   Other Studies Reviewed Today:  Echocardiogram 12/30/2013: Study Conclusions  - Left ventricle: The cavity size was below normal. Systolic function was vigorous. The estimated ejection fraction was in the range of 65% to 70%. Features are consistent with a pseudonormal left ventricular filling pattern, with concomitant abnormal relaxation and increased filling pressure (grade 2 diastolic dysfunction). Doppler parameters are consistent with high ventricular filling pressure. Moderate posterior wall and severe focal basal septal hypertrophy. - Aortic valve: Mildly calcified annulus. Trileaflet; mildly thickened, mildly calcified leaflets. There was no stenosis. - Aorta: Mild aortic root dilatation. Aortic root dimension: 40 mm (ED). - Mitral valve: Moderately calcified annulus. Mildly thickened leaflets . - Left atrium: The atrium was moderately dilated. Volume/bsa, S: 37.3 ml/m^2. - Inferior vena cava: The vessel was normal in size. The respirophasic diameter changes were in the normal range (>= 50%), consistent with normal central venous pressure. - Pericardium, extracardiac: A moderate size circumferential pericardial effusion is noted, with a maximum diameter of 1.56 cm. There is no RA/RV collapse with normal mitral inflow variation. No suggestion of tamponade physiology.  Impressions:  - When compared to the most recent study dated 07/25/13, the pericardial effusion has decreased in size. No evidence of tamponade physiology.  Lexiscan Cardiolite 01/20/2014: FINDINGS: Pharmacological stress  Baseline EKG shows sinus bradycardia. After injection heart rate increased from 54 beats per min  up to 77 beats per min, and blood pressure increased from 118/58 up to 148/62. The test was stopped after injection was complete, the patient did not experience any chest pain. Post-injection EKG showed no specific ischemic changes and no significant arrhythmias.  Perfusion: There is a moderate-sized mild intensity inferior wall defect seen in the pre injection images. A similar slightly less intense defect is seen in the post-injection images. The inferior wall has normal wall motion, overall findings most consistent with sub- diaphragmatic attenuation. There are no other myocardial perfusion defects.  Wall Motion: Normal left ventricular wall motion. No left ventricular dilation.  Left Ventricular Ejection Fraction: 64 %  End diastolic volume 65 ml  End systolic volume 23 ml  IMPRESSION: 1. No reversible ischemia or infarction.  2. Normal left ventricular wall motion.  3. Left ventricular ejection fraction 64%  4. Low-risk stress test findings*.  Assessment and Plan:  1. Paroxysmal atrial fibrillation, maintaining sinus rhythm on beta blocker and flecainide. He remains on aspirin, declines anticoagulation so far. Ischemic workup from last year was negative.  2. Recurrent pericardial effusion status post previous window. Follow-up echocardiogram will be obtained, previously in the moderate range by last study.  He is asymptomatic.  Current medicines were reviewed with the patient today.   Orders Placed This Encounter  Procedures  . ECHOCARDIOGRAM COMPLETE    Disposition: FU with me in 6 months.   Signed, Satira Sark, MD, [facility] 10/07/2014 11:43 AM    [place] at [place], [place], [place] [address] Phone: [phone]; Fax: [phone]

## 2014-10-07 NOTE — Patient Instructions (Signed)

## 2014-11-04 ENCOUNTER — Ambulatory Visit (INDEPENDENT_AMBULATORY_CARE_PROVIDER_SITE_OTHER): Payer: Medicare Other

## 2014-11-04 ENCOUNTER — Other Ambulatory Visit: Payer: Self-pay

## 2014-11-04 DIAGNOSIS — I319 Disease of pericardium, unspecified: Secondary | ICD-10-CM

## 2014-11-04 DIAGNOSIS — I313 Pericardial effusion (noninflammatory): Secondary | ICD-10-CM

## 2014-11-04 DIAGNOSIS — I3139 Other pericardial effusion (noninflammatory): Secondary | ICD-10-CM

## 2014-11-05 ENCOUNTER — Telehealth: Payer: Self-pay | Admitting: *Deleted

## 2014-11-05 NOTE — Telephone Encounter (Signed)
-----   Message from Satira Sark, MD sent at 11/04/2014  8:13 PM EDT ----- Reviewed report. Pericardial effusion is now small. Continue regular followup as scheduled.

## 2014-11-05 NOTE — Telephone Encounter (Signed)
Wife informed

## 2014-11-25 ENCOUNTER — Other Ambulatory Visit: Payer: Self-pay | Admitting: Cardiology

## 2014-12-01 DIAGNOSIS — E782 Mixed hyperlipidemia: Secondary | ICD-10-CM | POA: Diagnosis not present

## 2014-12-01 DIAGNOSIS — E1165 Type 2 diabetes mellitus with hyperglycemia: Secondary | ICD-10-CM | POA: Diagnosis not present

## 2014-12-01 DIAGNOSIS — I10 Essential (primary) hypertension: Secondary | ICD-10-CM | POA: Diagnosis not present

## 2014-12-01 DIAGNOSIS — Z9189 Other specified personal risk factors, not elsewhere classified: Secondary | ICD-10-CM | POA: Diagnosis not present

## 2014-12-08 DIAGNOSIS — E782 Mixed hyperlipidemia: Secondary | ICD-10-CM | POA: Diagnosis not present

## 2014-12-08 DIAGNOSIS — E1122 Type 2 diabetes mellitus with diabetic chronic kidney disease: Secondary | ICD-10-CM | POA: Diagnosis not present

## 2014-12-08 DIAGNOSIS — J301 Allergic rhinitis due to pollen: Secondary | ICD-10-CM | POA: Diagnosis not present

## 2014-12-08 DIAGNOSIS — M1000 Idiopathic gout, unspecified site: Secondary | ICD-10-CM | POA: Diagnosis not present

## 2014-12-08 DIAGNOSIS — N182 Chronic kidney disease, stage 2 (mild): Secondary | ICD-10-CM | POA: Diagnosis not present

## 2014-12-08 DIAGNOSIS — Z23 Encounter for immunization: Secondary | ICD-10-CM | POA: Diagnosis not present

## 2014-12-08 DIAGNOSIS — I10 Essential (primary) hypertension: Secondary | ICD-10-CM | POA: Diagnosis not present

## 2014-12-08 DIAGNOSIS — G2581 Restless legs syndrome: Secondary | ICD-10-CM | POA: Diagnosis not present

## 2014-12-08 DIAGNOSIS — C61 Malignant neoplasm of prostate: Secondary | ICD-10-CM | POA: Diagnosis not present

## 2014-12-08 DIAGNOSIS — I480 Paroxysmal atrial fibrillation: Secondary | ICD-10-CM | POA: Diagnosis not present

## 2015-01-15 DIAGNOSIS — H401131 Primary open-angle glaucoma, bilateral, mild stage: Secondary | ICD-10-CM | POA: Diagnosis not present

## 2015-03-11 ENCOUNTER — Other Ambulatory Visit: Payer: Self-pay | Admitting: *Deleted

## 2015-03-11 MED ORDER — FLECAINIDE ACETATE 50 MG PO TABS: 50.0000 mg | ORAL_TABLET | Freq: Two times a day (BID) | ORAL | Status: DC

## 2015-03-25 DIAGNOSIS — N182 Chronic kidney disease, stage 2 (mild): Secondary | ICD-10-CM | POA: Diagnosis not present

## 2015-03-25 DIAGNOSIS — I10 Essential (primary) hypertension: Secondary | ICD-10-CM | POA: Diagnosis not present

## 2015-03-25 DIAGNOSIS — E1165 Type 2 diabetes mellitus with hyperglycemia: Secondary | ICD-10-CM | POA: Diagnosis not present

## 2015-03-25 DIAGNOSIS — E782 Mixed hyperlipidemia: Secondary | ICD-10-CM | POA: Diagnosis not present

## 2015-03-27 ENCOUNTER — Other Ambulatory Visit: Payer: Self-pay | Admitting: Cardiology

## 2015-04-01 DIAGNOSIS — M1000 Idiopathic gout, unspecified site: Secondary | ICD-10-CM | POA: Diagnosis not present

## 2015-04-01 DIAGNOSIS — I10 Essential (primary) hypertension: Secondary | ICD-10-CM | POA: Diagnosis not present

## 2015-04-01 DIAGNOSIS — J301 Allergic rhinitis due to pollen: Secondary | ICD-10-CM | POA: Diagnosis not present

## 2015-04-01 DIAGNOSIS — Z0001 Encounter for general adult medical examination with abnormal findings: Secondary | ICD-10-CM | POA: Diagnosis not present

## 2015-04-01 DIAGNOSIS — E1122 Type 2 diabetes mellitus with diabetic chronic kidney disease: Secondary | ICD-10-CM | POA: Diagnosis not present

## 2015-04-01 DIAGNOSIS — Z23 Encounter for immunization: Secondary | ICD-10-CM | POA: Diagnosis not present

## 2015-04-01 DIAGNOSIS — G2581 Restless legs syndrome: Secondary | ICD-10-CM | POA: Diagnosis not present

## 2015-04-01 DIAGNOSIS — N182 Chronic kidney disease, stage 2 (mild): Secondary | ICD-10-CM | POA: Diagnosis not present

## 2015-04-01 DIAGNOSIS — E782 Mixed hyperlipidemia: Secondary | ICD-10-CM | POA: Diagnosis not present

## 2015-04-07 ENCOUNTER — Ambulatory Visit (INDEPENDENT_AMBULATORY_CARE_PROVIDER_SITE_OTHER): Payer: Medicare Other | Admitting: Cardiology

## 2015-04-07 ENCOUNTER — Encounter: Payer: Self-pay | Admitting: *Deleted

## 2015-04-07 ENCOUNTER — Encounter: Payer: Self-pay | Admitting: Cardiology

## 2015-04-07 VITALS — BP 132/62 | HR 55 | Ht 72.0 in | Wt 225.0 lb

## 2015-04-07 DIAGNOSIS — M7062 Trochanteric bursitis, left hip: Secondary | ICD-10-CM | POA: Diagnosis not present

## 2015-04-07 DIAGNOSIS — I480 Paroxysmal atrial fibrillation: Secondary | ICD-10-CM | POA: Diagnosis not present

## 2015-04-07 DIAGNOSIS — I313 Pericardial effusion (noninflammatory): Secondary | ICD-10-CM

## 2015-04-07 DIAGNOSIS — I319 Disease of pericardium, unspecified: Secondary | ICD-10-CM | POA: Diagnosis not present

## 2015-04-07 DIAGNOSIS — I3139 Other pericardial effusion (noninflammatory): Secondary | ICD-10-CM

## 2015-04-07 NOTE — Progress Notes (Signed)
Cardiology Office Note  Date: 04/07/2015   ID: [name] [name], DOB [birth year], MRN [id number]  PCP: Gar Ponto, MD  Primary Cardiologist: Rozann Lesches, MD   Chief Complaint  Patient presents with  . Pericardial Effusion  . PAF    History of Present Illness: [name] [name] is a 75 y.o. male last seen in July 2016. He presents for a routine follow-up visit. From a cardiac perspective , he has not had any significant palpitations or chest pain. States that he is very pleased with how he has done on the current cardiac regimen.  We reviewed his medications which are outlined below. Cardiac regimen includes aspirin, Lotensin, flecainide, and Toprol-XL. He reports compliance and no obvious intolerances.  CHADSVASC score is 1 (age) and he continues on aspirin.  He states that he had recent lab work with Dr. [name] earlier in January. ECG today shows sinus bradycardia with nonspecific ST segment changes and QRS 103 ms.   Past Medical History  Diagnosis Date  . Pericardial effusion     Idiopathic, recurrent pericardial effusion s/p pericardial window.  . Supraventricular tachycardia ([place])   . Paroxysmal atrial fibrillation ([facility])   . Temporal arteritis ([place])   . Leg fracture     Distal tibial/fibula 2003  . Prostate cancer [facility])     Seed implants 2007    Current Outpatient Prescriptions  Medication Sig Dispense Refill  . acetaminophen (TYLENOL) 500 MG tablet Take 1,000 mg by mouth every 6 (six) hours as needed for moderate pain.    Marland Kitchen allopurinol (ZYLOPRIM) 300 MG tablet Take 450 mg by mouth daily.     Marland Kitchen aspirin 81 MG tablet Take 81 mg by mouth daily.    . benazepril (LOTENSIN) 20 MG tablet Take 20 mg by mouth daily.    . chlorpheniramine-HYDROcodone (TUSSIONEX) 10-8 MG/5ML LQCR Take 2.5 mLs by mouth as needed.     . chlorthalidone (HYGROTON) 25 MG tablet Take 1 tablet (25 mg total) by mouth daily. 30 tablet 3  . flecainide (TAMBOCOR) 50 MG tablet Take 1 tablet (50 mg total)  by mouth 2 (two) times daily. 180 tablet 3  . fluticasone (FLONASE) 50 MCG/ACT nasal spray Place 2 sprays into the nose as needed for allergies or rhinitis.     . metoprolol succinate (TOPROL-XL) 50 MG 24 hr tablet TAKE ONE TABLET BY MOUTH ONCE DAILY WITH FOOD OR IMMEDIATELY FOLLOWING A MEAL 30 tablet 6  . Multiple Vitamin (MULTIVITAMIN) tablet Take 1 tablet by mouth daily.    . pantoprazole (PROTONIX) 40 MG tablet TAKE ONE TABLET BY MOUTH ONCE DAILY 30 tablet 3   No current facility-administered medications for this visit.   Allergies:  Amiodarone   Social History: The patient  reports that he quit smoking about 55 years ago. His smoking use included Cigarettes. He started smoking about 56 years ago. He has a .9 pack-year smoking history. He has never used smokeless tobacco. He reports that he does not drink alcohol or use illicit drugs.   ROS:  Please see the history of present illness. Otherwise, complete review of systems is positive for left hip and leg pain , sounds potentially related to the sciatic nerve based on description. No obvious injuries. All other systems are reviewed and negative.   Physical Exam: VS:  BP 132/62 mmHg  Pulse 55  Ht 6' (1.829 m)  Wt 225 lb (102.059 kg)  BMI 30.51 kg/m2  SpO2 98%, BMI Body mass index is 30.51 kg/(m^2).  Wt Readings from Last 3 Encounters:  04/07/15 225 lb (102.059 kg)  10/07/14 212 lb 12.8 oz (96.525 kg)  03/31/14 237 lb (107.502 kg)    General: Elderly male, appears comfortable at rest. HEENT: Conjunctiva and lids normal, oropharynx clear. Neck: Supple, no elevated JVP or carotid bruits, no thyromegaly. Lungs: Clear to auscultation, nonlabored breathing at rest. Cardiac: Regular rate and rhythm, no S3 or significant systolic murmur, no pericardial rub. Abdomen: Soft, nontender, bowel sounds present, no guarding or rebound. Extremities: Trace ankle edema, distal pulses 2+.  ECG: Tracing from 10/07/2014 showed normal sinus rhythm with  low voltage, R' in lead V1, QRS 90 ms..  Other Studies Reviewed Today:  Echocardiogram 11/04/2014: Study Conclusions  - Left ventricle: The cavity size was normal. Wall thickness was increased in a pattern of severe LVH. Systolic function was vigorous. The estimated ejection fraction was in the range of 65% to 70%. Wall motion was normal; there were no regional wall motion abnormalities. Doppler parameters are consistent with abnormal left ventricular relaxation (grade 1 diastolic dysfunction). - Aortic valve: Mildly calcified annulus. Trileaflet; mildly thickened leaflets. Valve area (VTI): 3.57 cm^2. Valve area (Vmax): 3.7 cm^2. Valve area (Vmean): 3.3 cm^2. - Mitral valve: Mildly calcified annulus. Normal thickness leaflets . - Left atrium: The atrium was severely dilated. - Right atrium: The atrium was mildly dilated. - Pericardium: There is a small circumferential pericardial effusion.  Assessment and Plan:  1. Paroxysmal atrial fibrillation, symptomatically well controlled on current regimen which includes aspirin, Toprol-XL, and flecainide. ECG reviewed and stable. CHADSVASC score is 1.  2.  History of idiopathic pericardial effusion with previous pericardial window 2008 and subsequent recurrence. Most recent echocardiogram in August 2016 showed small residual pericardial effusion that is asymptomatic.  Current medicines were reviewed with the patient today.   Orders Placed This Encounter  Procedures  . EKG 12-Lead    Disposition: FU with me in 6 months.   Signed, Satira Sark, MD, [facility] 04/07/2015 8:24 AM    [facility] at [place], [place], [place] [address] Phone: [phone]; Fax: [phone]

## 2015-04-07 NOTE — Patient Instructions (Signed)
Your physician recommends that you continue on your current medications as directed. Please refer to the Current Medication list given to you today. Your physician recommends that you schedule a follow-up appointment in: 6 months. You will receive a reminder letter in the mail in about 4 months reminding you to call and schedule your appointment. If you don't receive this letter, please contact our office. 

## 2015-04-12 DIAGNOSIS — M5432 Sciatica, left side: Secondary | ICD-10-CM | POA: Diagnosis not present

## 2015-05-24 DIAGNOSIS — H25813 Combined forms of age-related cataract, bilateral: Secondary | ICD-10-CM | POA: Diagnosis not present

## 2015-05-24 DIAGNOSIS — H25812 Combined forms of age-related cataract, left eye: Secondary | ICD-10-CM | POA: Diagnosis not present

## 2015-05-24 DIAGNOSIS — H401134 Primary open-angle glaucoma, bilateral, indeterminate stage: Secondary | ICD-10-CM | POA: Diagnosis not present

## 2015-05-24 DIAGNOSIS — H353131 Nonexudative age-related macular degeneration, bilateral, early dry stage: Secondary | ICD-10-CM | POA: Diagnosis not present

## 2015-06-01 NOTE — Patient Instructions (Signed)
Your procedure is scheduled on:   06/07/2015              Report to [facility] at   6:30  AM.  Call this number if you have problems the morning of surgery: [phone]   Remember:   Do not eat or drink :After Midnight.    Take these medicines the morning of surgery with A SIP OF WATER:       Benazepril, Metoprolol, and protonix     Do not wear jewelry, make-up or nail polish.  Do not wear lotions, powders, or perfumes. You may wear deodorant.  Do not bring valuables to the hospital.  Contacts, dentures or bridgework may not be worn into surgery.  Patients discharged the day of surgery will not be allowed to drive home.  Name and phone number of your driver:    [email or web address] @ Cataract Surgery  A cataract is a clouding of the lens of the eye. When a lens becomes cloudy, vision is reduced based on the degree and nature of the clouding. Surgery may be needed to improve vision. Surgery removes the cloudy lens and usually replaces it with a substitute lens (intraocular lens, IOL). LET YOUR EYE DOCTOR KNOW ABOUT:  Allergies to food or medicine.   Medicines taken including herbs, eyedrops, over-the-counter medicines, and creams.   Use of steroids (by mouth or creams).   Previous problems with anesthetics or numbing medicine.   History of bleeding problems or blood clots.   Previous surgery.   Other health problems, including diabetes and kidney problems.   Possibility of pregnancy, if this applies.  RISKS AND COMPLICATIONS  Infection.   Inflammation of the eyeball (endophthalmitis) that can spread to both eyes (sympathetic ophthalmia).   Poor wound healing.   If an IOL is inserted, it can later fall out of proper position. This is very uncommon.   Clouding of the part of your eye that holds an IOL in place. This is called an "after-cataract." These are uncommon, but easily treated.  BEFORE THE PROCEDURE  Do not eat or drink anything except small amounts of water for 8  to 12 before your surgery, or as directed by your caregiver.   Unless you are told otherwise, continue any eyedrops you have been prescribed.   Talk to your primary caregiver about all other medicines that you take (both prescription and non-prescription). In some cases, you may need to stop or change medicines near the time of your surgery. This is most important if you are taking blood-thinning medicine.Do not stop medicines unless you are told to do so.   Arrange for someone to drive you to and from the procedure.   Do not put contact lenses in either eye on the day of your surgery.  PROCEDURE There is more than one method for safely removing a cataract. Your doctor can explain the differences and help determine which is best for you. Phacoemulsification surgery is the most common form of cataract surgery.  An injection is given behind the eye or eyedrops are given to make this a painless procedure.   A small cut (incision) is made on the edge of the clear, dome-shaped surface that covers the front of the eye (cornea).   A tiny probe is painlessly inserted into the eye. This device gives off ultrasound waves that soften and break up the cloudy center of the lens. This makes it easier for the cloudy lens to be removed by suction.   An IOL  may be implanted.   The normal lens of the eye is covered by a clear capsule. Part of that capsule is intentionally left in the eye to support the IOL.   Your surgeon may or may not use stitches to close the incision.  There are other forms of cataract surgery that require a larger incision and stiches to close the eye. This approach is taken in cases where the doctor feels that the cataract cannot be easily removed using phacoemulsification. AFTER THE PROCEDURE  When an IOL is implanted, it does not need care. It becomes a permanent part of your eye and cannot be seen or felt.   Your doctor will schedule follow-up exams to check on your progress.     Review your other medicines with your doctor to see which can be resumed after surgery.   Use eyedrops or take medicine as prescribed by your doctor.  Document Released: 03/02/2011 Document Reviewed: 02/27/2011 [facility] Patient Information 2012 [place].  .Cataract Surgery Care After Refer to this sheet in the next few weeks. These instructions provide you with information on caring for yourself after your procedure. Your caregiver may also give you more specific instructions. Your treatment has been planned according to current medical practices, but problems sometimes occur. Call your caregiver if you have any problems or questions after your procedure.  HOME CARE INSTRUCTIONS   Avoid strenuous activities as directed by your caregiver.   Ask your caregiver when you can resume driving.   Use eyedrops or other medicines to help healing and control pressure inside your eye as directed by your caregiver.   Only take over-the-counter or prescription medicines for pain, discomfort, or fever as directed by your caregiver.   Do not to touch or rub your eyes.   You may be instructed to use a protective shield during the first few days and nights after surgery. If not, wear sunglasses to protect your eyes. This is to protect the eye from pressure or from being accidentally bumped.   Keep the area around your eye clean and dry. Avoid swimming or allowing water to hit you directly in the face while showering. Keep soap and shampoo out of your eyes.   Do not bend or lift heavy objects. Bending increases pressure in the eye. You can walk, climb stairs, and do light household chores.   Do not put a contact lens into the eye that had surgery until your caregiver says it is okay to do so.   Ask your doctor when you can return to work. This will depend on the kind of work that you do. If you work in a dusty environment, you may be advised to wear protective eyewear for a period of time.    Ask your caregiver when it will be safe to engage in sexual activity.   Continue with your regular eye exams as directed by your caregiver.  What to expect:  It is normal to feel itching and mild discomfort for a few days after cataract surgery. Some fluid discharge is also common, and your eye may be sensitive to light and touch.   After 1 to 2 days, even moderate discomfort should disappear. In most cases, healing will take about 6 weeks.   If you received an intraocular lens (IOL), you may notice that colors are very bright or have a blue tinge. Also, if you have been in bright sunlight, everything may appear reddish for a few hours. If you see these color tinges,  it is because your lens is clear and no longer cloudy. Within a few months after receiving an IOL, these extra colors should go away. When you have healed, you will probably need new glasses.  SEEK MEDICAL CARE IF:   You have increased bruising around your eye.   You have discomfort not helped by medicine.  SEEK IMMEDIATE MEDICAL CARE IF:   You have a fever.   You have a worsening or sudden vision loss.   You have redness, swelling, or increasing pain in the eye.   You have a thick discharge from the eye that had surgery.  MAKE SURE YOU:  Understand these instructions.   Will watch your condition.   Will get help right away if you are not doing well or get worse.  Document Released: 09/30/2004 Document Revised: 03/02/2011 Document Reviewed: 11/04/2010 [facility] Patient Information 2012 [place].    Monitored Anesthesia Care  Monitored anesthesia care is an anesthesia service for a medical procedure. Anesthesia is the loss of the ability to feel pain. It is produced by medications called anesthetics. It may affect a small area of your body (local anesthesia), a large area of your body (regional anesthesia), or your entire body (general anesthesia). The need for monitored anesthesia care depends your  procedure, your condition, and the potential need for regional or general anesthesia. It is often provided during procedures where:   General anesthesia may be needed if there are complications. This is because you need special care when you are under general anesthesia.   You will be under local or regional anesthesia. This is so that you are able to have higher levels of anesthesia if needed.   You will receive calming medications (sedatives). This is especially the case if sedatives are given to put you in a semi-conscious state of relaxation (deep sedation). This is because the amount of sedative needed to produce this state can be hard to predict. Too much of a sedative can produce general anesthesia. Monitored anesthesia care is performed by one or more caregivers who have special training in all types of anesthesia. You will need to meet with these caregivers before your procedure. During this meeting, they will ask you about your medical history. They will also give you instructions to follow. (For example, you will need to stop eating and drinking before your procedure. You may also need to stop or change medications you are taking.) During your procedure, your caregivers will stay with you. They will:   Watch your condition. This includes watching you blood pressure, breathing, and level of pain.   Diagnose and treat problems that occur.   Give medications if they are needed. These may include calming medications (sedatives) and anesthetics.   Make sure you are comfortable.  Having monitored anesthesia care does not necessarily mean that you will be under anesthesia. It does mean that your caregivers will be able to manage anesthesia if you need it or if it occurs. It also means that you will be able to have a different type of anesthesia than you are having if you need it. When your procedure is complete, your caregivers will continue to watch your condition. They will make sure any  medications wear off before you are allowed to go home.  Document Released: 12/07/2004 Document Revised: 07/08/2012 Document Reviewed: 04/24/2012 [facility] Patient Information 2014 [place], [place].

## 2015-06-02 ENCOUNTER — Encounter (HOSPITAL_COMMUNITY)
Admission: RE | Admit: 2015-06-02 | Discharge: 2015-06-02 | Disposition: A | Discharge status: Home / Self Care | Payer: Medicare Other | Source: Ambulatory Visit | Attending: Ophthalmology | Admitting: Ophthalmology

## 2015-06-02 ENCOUNTER — Encounter (HOSPITAL_COMMUNITY): Payer: Self-pay

## 2015-06-02 DIAGNOSIS — Z01812 Encounter for preprocedural laboratory examination: Secondary | ICD-10-CM | POA: Insufficient documentation

## 2015-06-02 LAB — CBC
HCT: 40.7 % (ref 39.0–52.0)
Hemoglobin: 13.5 g/dL (ref 13.0–17.0)
MCH: 30.9 pg (ref 26.0–34.0)
MCHC: 33.2 g/dL (ref 30.0–36.0)
MCV: 93.1 fL (ref 78.0–100.0)
PLATELETS: 181 10*3/uL (ref 150–400)
RBC: 4.37 MIL/uL (ref 4.22–5.81)
RDW: 14.5 % (ref 11.5–15.5)
WBC: 8.2 10*3/uL (ref 4.0–10.5)

## 2015-06-02 LAB — BASIC METABOLIC PANEL
Anion gap: 6 (ref 5–15)
BUN: 31 mg/dL — AB (ref 6–20)
CALCIUM: 9.1 mg/dL (ref 8.9–10.3)
CO2: 27 mmol/L (ref 22–32)
Chloride: 105 mmol/L (ref 101–111)
Creatinine, Ser: 1.22 mg/dL (ref 0.61–1.24)
GFR calc Af Amer: >60 mL/min (ref >60)
GFR, EST NON AFRICAN AMERICAN: 57 mL/min — AB (ref >60)
GLUCOSE: 111 mg/dL — AB (ref 65–99)
Potassium: 3.9 mmol/L (ref 3.5–5.1)
SODIUM: 138 mmol/L (ref 135–145)

## 2015-06-07 ENCOUNTER — Encounter (HOSPITAL_COMMUNITY)
Admission: RE | Disposition: A | Discharge status: Home / Self Care | Payer: Self-pay | Source: Ambulatory Visit | Attending: Ophthalmology

## 2015-06-07 ENCOUNTER — Ambulatory Visit (HOSPITAL_COMMUNITY): Payer: Medicare Other | Admitting: Anesthesiology

## 2015-06-07 ENCOUNTER — Encounter (HOSPITAL_COMMUNITY): Payer: Self-pay | Admitting: *Deleted

## 2015-06-07 ENCOUNTER — Ambulatory Visit (HOSPITAL_COMMUNITY)
Admission: RE | Admit: 2015-06-07 | Discharge: 2015-06-07 | Disposition: A | Discharge status: Home / Self Care | Payer: Medicare Other | Source: Ambulatory Visit | Attending: Ophthalmology | Admitting: Ophthalmology

## 2015-06-07 DIAGNOSIS — I4891 Unspecified atrial fibrillation: Secondary | ICD-10-CM | POA: Insufficient documentation

## 2015-06-07 DIAGNOSIS — Z87891 Personal history of nicotine dependence: Secondary | ICD-10-CM | POA: Diagnosis not present

## 2015-06-07 DIAGNOSIS — Z923 Personal history of irradiation: Secondary | ICD-10-CM | POA: Diagnosis not present

## 2015-06-07 DIAGNOSIS — I10 Essential (primary) hypertension: Secondary | ICD-10-CM | POA: Insufficient documentation

## 2015-06-07 DIAGNOSIS — H2512 Age-related nuclear cataract, left eye: Secondary | ICD-10-CM | POA: Diagnosis not present

## 2015-06-07 DIAGNOSIS — H25812 Combined forms of age-related cataract, left eye: Secondary | ICD-10-CM | POA: Diagnosis not present

## 2015-06-07 DIAGNOSIS — H269 Unspecified cataract: Secondary | ICD-10-CM | POA: Diagnosis not present

## 2015-06-07 DIAGNOSIS — Z8546 Personal history of malignant neoplasm of prostate: Secondary | ICD-10-CM | POA: Diagnosis not present

## 2015-06-07 HISTORY — PX: CATARACT EXTRACTION W/PHACO: SHX586

## 2015-06-07 SURGERY — PHACOEMULSIFICATION, CATARACT, WITH IOL INSERTION
Anesthesia: Monitor Anesthesia Care | Site: Eye | Laterality: Left

## 2015-06-07 MED ORDER — TETRACAINE HCL 0.5 % OP SOLN
1.0000 [drp] | OPHTHALMIC | Status: AC
  Administered 2015-06-07 (×3): 1 [drp] via OPHTHALMIC

## 2015-06-07 MED ORDER — LACTATED RINGERS IV SOLN
INTRAVENOUS | Status: DC
  Administered 2015-06-07: 1000 mL via INTRAVENOUS

## 2015-06-07 MED ORDER — NEOMYCIN-POLYMYXIN-DEXAMETH 3.5-10000-0.1 OP SUSP
OPHTHALMIC | Status: DC | PRN
  Administered 2015-06-07: 2 [drp] via OPHTHALMIC

## 2015-06-07 MED ORDER — FENTANYL CITRATE (PF) 100 MCG/2ML IJ SOLN
25.0000 ug | INTRAMUSCULAR | Status: AC
  Administered 2015-06-07 (×2): 25 ug via INTRAVENOUS

## 2015-06-07 MED ORDER — BSS IO SOLN
INTRAOCULAR | Status: DC | PRN
  Administered 2015-06-07: 15 mL

## 2015-06-07 MED ORDER — PHENYLEPHRINE HCL 2.5 % OP SOLN
1.0000 [drp] | OPHTHALMIC | Status: AC
  Administered 2015-06-07 (×3): 1 [drp] via OPHTHALMIC

## 2015-06-07 MED ORDER — MIDAZOLAM HCL 2 MG/2ML IJ SOLN
INTRAMUSCULAR | Status: AC
Start: 2015-06-07 — End: 2015-06-07
  Filled 2015-06-07: qty 2

## 2015-06-07 MED ORDER — MIDAZOLAM HCL 2 MG/2ML IJ SOLN
1.0000 mg | INTRAMUSCULAR | Status: DC | PRN
  Administered 2015-06-07: 2 mg via INTRAVENOUS

## 2015-06-07 MED ORDER — EPINEPHRINE HCL 1 MG/ML IJ SOLN
INTRAMUSCULAR | Status: AC
  Filled 2015-06-07: qty 1

## 2015-06-07 MED ORDER — PROVISC 10 MG/ML IO SOLN
INTRAOCULAR | Status: DC | PRN
  Administered 2015-06-07: 0.85 mL via INTRAOCULAR

## 2015-06-07 MED ORDER — EPINEPHRINE HCL 1 MG/ML IJ SOLN
INTRAOCULAR | Status: DC | PRN
  Administered 2015-06-07: 500 mL

## 2015-06-07 MED ORDER — FENTANYL CITRATE (PF) 100 MCG/2ML IJ SOLN
INTRAMUSCULAR | Status: AC
  Filled 2015-06-07: qty 2

## 2015-06-07 MED ORDER — CYCLOPENTOLATE-PHENYLEPHRINE 0.2-1 % OP SOLN
1.0000 [drp] | OPHTHALMIC | Status: AC
  Administered 2015-06-07 (×3): 1 [drp] via OPHTHALMIC

## 2015-06-07 MED ORDER — POVIDONE-IODINE 5 % OP SOLN
OPHTHALMIC | Status: DC | PRN
  Administered 2015-06-07: 1 via OPHTHALMIC

## 2015-06-07 MED ORDER — LIDOCAINE HCL 3.5 % OP GEL
1.0000 "application " | Freq: Once | OPHTHALMIC | Status: AC
  Administered 2015-06-07: 1 via OPHTHALMIC

## 2015-06-07 MED ORDER — LIDOCAINE HCL (PF) 1 % IJ SOLN
INTRAOCULAR | Status: DC | PRN
  Administered 2015-06-07: .9 mL via OPHTHALMIC

## 2015-06-07 SURGICAL SUPPLY — 12 items
CLOTH BEACON ORANGE TIMEOUT ST (SAFETY) ×1 IMPLANT
EYE SHIELD UNIVERSAL CLEAR (GAUZE/BANDAGES/DRESSINGS) ×1 IMPLANT
GLOVE BIOGEL PI IND STRL 6.5 (GLOVE) IMPLANT
GLOVE BIOGEL PI IND STRL 7.0 (GLOVE) IMPLANT
GLOVE BIOGEL PI INDICATOR 6.5 (GLOVE) ×1
GLOVE BIOGEL PI INDICATOR 7.0 (GLOVE) ×1
PAD ARMBOARD 7.5X6 YLW CONV (MISCELLANEOUS) ×1 IMPLANT
SIGHTPATH CAT PROC W REG LENS (Ophthalmic Related) ×2 IMPLANT
SYRINGE LUER LOK 1CC (MISCELLANEOUS) ×1 IMPLANT
TAPE SURG TRANSPORE 1 IN (GAUZE/BANDAGES/DRESSINGS) IMPLANT
TAPE SURGICAL TRANSPORE 1 IN (GAUZE/BANDAGES/DRESSINGS) ×1
WATER STERILE IRR 250ML POUR (IV SOLUTION) ×1 IMPLANT

## 2015-06-07 NOTE — H&P (Signed)
I have reviewed the H&P, the patient was re-examined, and I have identified no interval changes in medical condition and plan of care since the history and physical of record  

## 2015-06-07 NOTE — Anesthesia Preprocedure Evaluation (Signed)
Anesthesia Evaluation  Patient identified by MRN, date of birth, ID band Patient awake    Reviewed: Allergy & Precautions, NPO status , Patient's Chart, lab work & pertinent test results  Airway Mallampati: II  TM Distance: >3 FB     Dental  (+) Teeth Intact, Partial Upper   Pulmonary former smoker,    breath sounds clear to auscultation       Cardiovascular hypertension, Pt. on medications + dysrhythmias Atrial Fibrillation and Supra Ventricular Tachycardia  Rhythm:Regular Rate:Normal  Pericardial window for effusion.   Neuro/Psych    GI/Hepatic negative GI ROS,   Endo/Other    Renal/GU      Musculoskeletal   Abdominal   Peds  Hematology   Anesthesia Other Findings   Reproductive/Obstetrics                             Anesthesia Physical Anesthesia Plan  ASA: III  Anesthesia Plan: MAC   Post-op Pain Management:    Induction: Intravenous  Airway Management Planned: Nasal Cannula  Additional Equipment:   Intra-op Plan:   Post-operative Plan:   Informed Consent: I have reviewed the patients History and Physical, chart, labs and discussed the procedure including the risks, benefits and alternatives for the proposed anesthesia with the patient or authorized representative who has indicated his/her understanding and acceptance.     Plan Discussed with:   Anesthesia Plan Comments:         Anesthesia Quick Evaluation

## 2015-06-07 NOTE — Op Note (Signed)
Date of Admission: 06/07/2015  Date of Surgery: 06/07/2015   Pre-Op Dx: Cataract Left Eye  Post-Op Dx: Senile Combined Cataract Left  Eye,  Dx Code [id number]  Surgeon: Tonny Branch, M.D.  Assistants: None  Anesthesia: Topical with MAC  Indications: Painless, progressive loss of vision with compromise of daily activities.  Surgery: Cataract Extraction with Intraocular lens Implant Left Eye  Discription: The patient had dilating drops and viscous lidocaine placed into the Left eye in the pre-op holding area. After transfer to the operating room, a time out was performed. The patient was then prepped and draped. Beginning with a 72 degree blade a paracentesis port was made at the surgeon's 2 o'clock position. The anterior chamber was then filled with 1% non-preserved lidocaine with epinepherine. This was followed by filling the anterior chamber with Provisc.  A 2.33mm keratome blade was used to make a clear corneal incision at the temporal limbus.  A bent cystatome needle was used to create a continuous tear capsulotomy. Hydrodissection was performed with balanced salt solution on a Fine canula. The lens nucleus was then removed using the phacoemulsification handpiece. Residual cortex was removed with the I&A handpiece. The anterior chamber and capsular bag were refilled with Provisc. A posterior chamber intraocular lens was placed into the capsular bag with it's injector. The implant was positioned with the Kuglan hook. The Provisc was then removed from the anterior chamber and capsular bag with the I&A handpiece. Stromal hydration of the main incision and paracentesis port was performed with BSS on a Fine canula. The wounds were tested for leak which was negative. The patient tolerated the procedure well. There were no operative complications. The patient was then transferred to the recovery room in stable condition.  Complications: None  Specimen: None  EBL: None  Prosthetic device: Hoya iSert  250, power 20.5 D, SN [id number].

## 2015-06-07 NOTE — Discharge Instructions (Signed)
Anesthesia, Adult, Care After °Refer to this sheet in the next few weeks. These instructions provide you with information on caring for yourself after your procedure. Your health care provider may also give you more specific instructions. Your treatment has been planned according to current medical practices, but problems sometimes occur. Call your health care provider if you have any problems or questions after your procedure. °WHAT TO EXPECT AFTER THE PROCEDURE °After the procedure, it is typical to experience: °· Sleepiness. °· Nausea and vomiting. °HOME CARE INSTRUCTIONS °· For the first 24 hours after general anesthesia: °¨ Have a responsible person with you. °¨ Do not drive a car. If you are alone, do not take public transportation. °¨ Do not drink alcohol. °¨ Do not take medicine that has not been prescribed by your health care provider. °¨ Do not sign important papers or make important decisions. °¨ You may resume a normal diet and activities as directed by your health care provider. °· Change bandages (dressings) as directed. °· If you have questions or problems that seem related to general anesthesia, call the hospital and ask for the anesthetist or anesthesiologist on call. °SEEK MEDICAL CARE IF: °· You have nausea and vomiting that continue the day after anesthesia. °· You develop a rash. °SEEK IMMEDIATE MEDICAL CARE IF:  °· You have difficulty breathing. °· You have chest pain. °· You have any allergic problems. °  °This information is not intended to replace advice given to you by your health care provider. Make sure you discuss any questions you have with your health care provider. °  °Document Released: 06/19/2000 Document Revised: 04/03/2014 Document Reviewed: 07/12/2011 °Elsevier Interactive Patient Education ©2016 Elsevier Inc. ° °

## 2015-06-07 NOTE — Transfer of Care (Signed)
Immediate Anesthesia Transfer of Care Note  Patient: [name] [name]  Procedure(s) Performed: Procedure(s) (LRB): CATARACT EXTRACTION PHACO AND INTRAOCULAR LENS PLACEMENT LEFT EYE CDE=8.00 (Left)  Patient Location: Shortstay  Anesthesia Type: MAC  Level of Consciousness: awake  Airway & Oxygen Therapy: Patient Spontanous Breathing   Post-op Assessment: Report given to PACU RN, Post -op Vital signs reviewed and stable and Patient moving all extremities  Post vital signs: Reviewed and stable  Complications: No apparent anesthesia complications

## 2015-06-07 NOTE — Anesthesia Procedure Notes (Signed)
Procedure Name: MAC Date/Time: 06/07/2015 8:41 AM Performed by: Vista Deck Pre-anesthesia Checklist: Patient identified, Emergency Drugs available, Suction available, Timeout performed and Patient being monitored Patient Re-evaluated:Patient Re-evaluated prior to inductionOxygen Delivery Method: Nasal Cannula

## 2015-06-07 NOTE — Anesthesia Postprocedure Evaluation (Signed)
  Anesthesia Post-op Note  Patient: [name] [name]  Procedure(s) Performed: Procedure(s) (LRB): CATARACT EXTRACTION PHACO AND INTRAOCULAR LENS PLACEMENT LEFT EYE CDE=8.00 (Left)  Patient Location:  Short Stay  Anesthesia Type: MAC  Level of Consciousness: awake  Airway and Oxygen Therapy: Patient Spontanous Breathing  Post-op Pain: none  Post-op Assessment: Post-op Vital signs reviewed, Patient's Cardiovascular Status Stable, Respiratory Function Stable, Patent Airway, No signs of Nausea or vomiting and Pain level controlled  Post-op Vital Signs: Reviewed and stable  Complications: No apparent anesthesia complications

## 2015-06-08 ENCOUNTER — Encounter (HOSPITAL_COMMUNITY): Payer: Self-pay | Admitting: Ophthalmology

## 2015-06-14 DIAGNOSIS — H25811 Combined forms of age-related cataract, right eye: Secondary | ICD-10-CM | POA: Diagnosis not present

## 2015-06-14 DIAGNOSIS — Z961 Presence of intraocular lens: Secondary | ICD-10-CM | POA: Diagnosis not present

## 2015-06-15 ENCOUNTER — Encounter (HOSPITAL_COMMUNITY)
Admission: RE | Admit: 2015-06-15 | Discharge: 2015-06-15 | Disposition: A | Discharge status: Home / Self Care | Payer: Medicare Other | Source: Ambulatory Visit | Attending: Ophthalmology | Admitting: Ophthalmology

## 2015-06-15 ENCOUNTER — Encounter (HOSPITAL_COMMUNITY): Payer: Self-pay

## 2015-06-17 ENCOUNTER — Ambulatory Visit (HOSPITAL_COMMUNITY)
Admission: RE | Admit: 2015-06-17 | Discharge: 2015-06-17 | Disposition: A | Discharge status: Home / Self Care | Payer: Medicare Other | Source: Ambulatory Visit | Attending: Ophthalmology | Admitting: Ophthalmology

## 2015-06-17 ENCOUNTER — Encounter (HOSPITAL_COMMUNITY)
Admission: RE | Disposition: A | Discharge status: Home / Self Care | Payer: Self-pay | Source: Ambulatory Visit | Attending: Ophthalmology

## 2015-06-17 ENCOUNTER — Ambulatory Visit (HOSPITAL_COMMUNITY): Payer: Medicare Other | Admitting: Anesthesiology

## 2015-06-17 ENCOUNTER — Encounter (HOSPITAL_COMMUNITY): Payer: Self-pay | Admitting: *Deleted

## 2015-06-17 DIAGNOSIS — H25811 Combined forms of age-related cataract, right eye: Secondary | ICD-10-CM | POA: Insufficient documentation

## 2015-06-17 DIAGNOSIS — Z79899 Other long term (current) drug therapy: Secondary | ICD-10-CM | POA: Insufficient documentation

## 2015-06-17 DIAGNOSIS — I4891 Unspecified atrial fibrillation: Secondary | ICD-10-CM | POA: Insufficient documentation

## 2015-06-17 DIAGNOSIS — I10 Essential (primary) hypertension: Secondary | ICD-10-CM | POA: Diagnosis not present

## 2015-06-17 DIAGNOSIS — Z7982 Long term (current) use of aspirin: Secondary | ICD-10-CM | POA: Diagnosis not present

## 2015-06-17 DIAGNOSIS — H269 Unspecified cataract: Secondary | ICD-10-CM | POA: Diagnosis not present

## 2015-06-17 DIAGNOSIS — Z87891 Personal history of nicotine dependence: Secondary | ICD-10-CM | POA: Diagnosis not present

## 2015-06-17 HISTORY — PX: CATARACT EXTRACTION W/PHACO: SHX586

## 2015-06-17 SURGERY — PHACOEMULSIFICATION, CATARACT, WITH IOL INSERTION
Anesthesia: Monitor Anesthesia Care | Site: Eye | Laterality: Right

## 2015-06-17 MED ORDER — FENTANYL CITRATE (PF) 100 MCG/2ML IJ SOLN
25.0000 ug | INTRAMUSCULAR | Status: AC
  Administered 2015-06-17: 25 ug via INTRAVENOUS

## 2015-06-17 MED ORDER — LIDOCAINE HCL (PF) 1 % IJ SOLN
INTRAOCULAR | Status: DC | PRN
  Administered 2015-06-17: .7 mL via OPHTHALMIC

## 2015-06-17 MED ORDER — EPINEPHRINE HCL 1 MG/ML IJ SOLN
INTRAMUSCULAR | Status: AC
  Filled 2015-06-17: qty 1

## 2015-06-17 MED ORDER — MIDAZOLAM HCL 2 MG/2ML IJ SOLN
1.0000 mg | INTRAMUSCULAR | Status: DC | PRN
  Administered 2015-06-17: 2 mg via INTRAVENOUS

## 2015-06-17 MED ORDER — EPINEPHRINE HCL 1 MG/ML IJ SOLN
INTRAOCULAR | Status: DC | PRN
  Administered 2015-06-17: 500 mL

## 2015-06-17 MED ORDER — FENTANYL CITRATE (PF) 100 MCG/2ML IJ SOLN
INTRAMUSCULAR | Status: AC
  Filled 2015-06-17: qty 2

## 2015-06-17 MED ORDER — BSS IO SOLN
INTRAOCULAR | Status: DC | PRN
  Administered 2015-06-17: 15 mL

## 2015-06-17 MED ORDER — LIDOCAINE 3.5 % OP GEL OPTIME - NO CHARGE
OPHTHALMIC | Status: DC | PRN
  Administered 2015-06-17: 2 [drp] via OPHTHALMIC

## 2015-06-17 MED ORDER — PHENYLEPHRINE HCL 2.5 % OP SOLN
1.0000 [drp] | OPHTHALMIC | Status: AC
Start: 2015-06-17 — End: 2015-06-17
  Administered 2015-06-17 (×3): 1 [drp] via OPHTHALMIC

## 2015-06-17 MED ORDER — LACTATED RINGERS IV SOLN
INTRAVENOUS | Status: DC
  Administered 2015-06-17: 11:00:00 via INTRAVENOUS

## 2015-06-17 MED ORDER — LIDOCAINE HCL 3.5 % OP GEL
1.0000 | Freq: Once | OPHTHALMIC | Status: AC
Start: 2015-06-17 — End: 2015-06-17
  Administered 2015-06-17: 1 via OPHTHALMIC

## 2015-06-17 MED ORDER — MIDAZOLAM HCL 2 MG/2ML IJ SOLN
INTRAMUSCULAR | Status: AC
  Filled 2015-06-17: qty 2

## 2015-06-17 MED ORDER — TETRACAINE HCL 0.5 % OP SOLN
1.0000 [drp] | OPHTHALMIC | Status: AC
Start: 2015-06-17 — End: 2015-06-17
  Administered 2015-06-17 (×3): 1 [drp] via OPHTHALMIC

## 2015-06-17 MED ORDER — PHENYLEPHRINE-KETOROLAC 1-0.3 % IO SOLN
INTRAOCULAR | Status: AC
  Filled 2015-06-17: qty 4

## 2015-06-17 MED ORDER — CYCLOPENTOLATE-PHENYLEPHRINE 0.2-1 % OP SOLN
1.0000 [drp] | OPHTHALMIC | Status: AC
  Administered 2015-06-17 (×3): 1 [drp] via OPHTHALMIC

## 2015-06-17 MED ORDER — NEOMYCIN-POLYMYXIN-DEXAMETH 3.5-10000-0.1 OP SUSP
OPHTHALMIC | Status: DC | PRN
  Administered 2015-06-17: 2 [drp] via OPHTHALMIC

## 2015-06-17 MED ORDER — PHENYLEPHRINE HCL 2.5 % OP SOLN
OPHTHALMIC | Status: AC
  Filled 2015-06-17: qty 15

## 2015-06-17 MED ORDER — POVIDONE-IODINE 5 % OP SOLN
OPHTHALMIC | Status: DC | PRN
  Administered 2015-06-17: 1 via OPHTHALMIC

## 2015-06-17 MED ORDER — PROVISC 10 MG/ML IO SOLN
INTRAOCULAR | Status: DC | PRN
  Administered 2015-06-17: 0.85 mL via INTRAOCULAR

## 2015-06-17 SURGICAL SUPPLY — 11 items
CLOTH BEACON ORANGE TIMEOUT ST (SAFETY) ×1 IMPLANT
EYE SHIELD UNIVERSAL CLEAR (GAUZE/BANDAGES/DRESSINGS) ×1 IMPLANT
GLOVE BIOGEL PI IND STRL 6.5 (GLOVE) IMPLANT
GLOVE BIOGEL PI INDICATOR 6.5 (GLOVE) ×1
GLOVE EXAM NITRILE MD LF STRL (GLOVE) ×1 IMPLANT
PAD ARMBOARD 7.5X6 YLW CONV (MISCELLANEOUS) ×1 IMPLANT
SIGHTPATH CAT PROC W REG LENS (Ophthalmic Related) ×2 IMPLANT
SYRINGE LUER LOK 1CC (MISCELLANEOUS) ×1 IMPLANT
TAPE SURG TRANSPORE 1 IN (GAUZE/BANDAGES/DRESSINGS) IMPLANT
TAPE SURGICAL TRANSPORE 1 IN (GAUZE/BANDAGES/DRESSINGS) ×1
WATER STERILE IRR 250ML POUR (IV SOLUTION) ×1 IMPLANT

## 2015-06-17 NOTE — Discharge Instructions (Signed)
Cataract Surgery, Care After °Refer to this sheet in the next few weeks. These instructions provide you with information on caring for yourself after your procedure. Your caregiver may also give you more specific instructions. Your treatment has been planned according to current medical practices, but problems sometimes occur. Call your caregiver if you have any problems or questions after your procedure.  °HOME CARE INSTRUCTIONS  °· Avoid strenuous activities as directed by your caregiver. °· Ask your caregiver when you can resume driving. °· Use eyedrops or other medicines to help healing and control pressure inside your eye as directed by your caregiver. °· Only take over-the-counter or prescription medicines for pain, discomfort, or fever as directed by your caregiver. °· Do not to touch or rub your eyes. °· You may be instructed to use a protective shield during the first few days and nights after surgery. If not, wear sunglasses to protect your eyes. This is to protect the eye from pressure or from being accidentally bumped. °· Keep the area around your eye clean and dry. Avoid swimming or allowing water to hit you directly in the face while showering. Keep soap and shampoo out of your eyes. °· Do not bend or lift heavy objects. Bending increases pressure in the eye. You can walk, climb stairs, and do light household chores. °· Do not put a contact lens into the eye that had surgery until your caregiver says it is okay to do so. °· Ask your doctor when you can return to work. This will depend on the kind of work that you do. If you work in a dusty environment, you may be advised to wear protective eyewear for a period of time. °· Ask your caregiver when it will be safe to engage in sexual activity. °· Continue with your regular eye exams as directed by your caregiver. °What to expect: °· It is normal to feel itching and mild discomfort for a few days after cataract surgery. Some fluid discharge is also common,  and your eye may be sensitive to light and touch. °· After 1 to 2 days, even moderate discomfort should disappear. In most cases, healing will take about 6 weeks. °· If you received an intraocular lens (IOL), you may notice that colors are very bright or have a blue tinge. Also, if you have been in bright sunlight, everything may appear reddish for a few hours. If you see these color tinges, it is because your lens is clear and no longer cloudy. Within a few months after receiving an IOL, these extra colors should go away. When you have healed, you will probably need new glasses. °SEEK MEDICAL CARE IF:  °· You have increased bruising around your eye. °· You have discomfort not helped by medicine. °SEEK IMMEDIATE MEDICAL CARE IF:  °· You have a  fever. °· You have a worsening or sudden vision loss. °· You have redness, swelling, or increasing pain in the eye. °· You have a thick discharge from the eye that had surgery. °MAKE SURE YOU: °· Understand these instructions. °· Will watch your condition. °· Will get help right away if you are not doing well or get worse. °  °This information is not intended to replace advice given to you by your health care provider. Make sure you discuss any questions you have with your health care provider. °  °Document Released: 09/30/2004 Document Revised: 04/03/2014 Document Reviewed: 11/04/2010 °Elsevier Interactive Patient Education ©2016 Elsevier Inc. ° °

## 2015-06-17 NOTE — Anesthesia Preprocedure Evaluation (Signed)
Anesthesia Evaluation  Patient identified by MRN, date of birth, ID band Patient awake    Reviewed: Allergy & Precautions, NPO status , Patient's Chart, lab work & pertinent test results  Airway Mallampati: II  TM Distance: >3 FB     Dental  (+) Teeth Intact, Partial Upper   Pulmonary former smoker,    breath sounds clear to auscultation       Cardiovascular hypertension, Pt. on medications + dysrhythmias Atrial Fibrillation and Supra Ventricular Tachycardia  Rhythm:Regular Rate:Normal  Pericardial window for effusion.   Neuro/Psych    GI/Hepatic negative GI ROS,   Endo/Other    Renal/GU      Musculoskeletal   Abdominal   Peds  Hematology   Anesthesia Other Findings   Reproductive/Obstetrics                             Anesthesia Physical Anesthesia Plan  ASA: III  Anesthesia Plan: MAC   Post-op Pain Management:    Induction: Intravenous  Airway Management Planned: Nasal Cannula  Additional Equipment:   Intra-op Plan:   Post-operative Plan:   Informed Consent: I have reviewed the patients History and Physical, chart, labs and discussed the procedure including the risks, benefits and alternatives for the proposed anesthesia with the patient or authorized representative who has indicated his/her understanding and acceptance.     Plan Discussed with:   Anesthesia Plan Comments:         Anesthesia Quick Evaluation

## 2015-06-17 NOTE — H&P (Signed)
I have reviewed the H&P, the patient was re-examined, and I have identified no interval changes in medical condition and plan of care since the history and physical of record  

## 2015-06-17 NOTE — Transfer of Care (Signed)
Immediate Anesthesia Transfer of Care Note  Patient: [name] [name]  Procedure(s) Performed: Procedure(s): CATARACT EXTRACTION PHACO AND INTRAOCULAR LENS PLACEMENT RIGHT EYE CDE=11.09 (Right)  Patient Location: Short Stay  Anesthesia Type:MAC  Level of Consciousness: awake, alert , oriented and patient cooperative  Airway & Oxygen Therapy: Patient Spontanous Breathing  Post-op Assessment: Report given to RN, Post -op Vital signs reviewed and stable and Patient moving all extremities  Post vital signs: Reviewed and stable  Last Vitals:  Filed Vitals:   06/17/15 1140 06/17/15 1145  BP:  124/57  Pulse:    Temp:    Resp: 11 11    Complications: No apparent anesthesia complications

## 2015-06-17 NOTE — Op Note (Signed)
Date of Admission: 06/17/2015  Date of Surgery: 06/17/2015   Pre-Op Dx: Cataract Right Eye  Post-Op Dx: Senile Combined Cataract Right  Eye,  Dx Code [id number]  Surgeon: Tonny Branch, M.D.  Assistants: None  Anesthesia: Topical with MAC  Indications: Painless, progressive loss of vision with compromise of daily activities.  Surgery: Cataract Extraction with Intraocular lens Implant Right Eye  Discription: The patient had dilating drops and viscous lidocaine placed into the Right eye in the pre-op holding area. After transfer to the operating room, a time out was performed. The patient was then prepped and draped. Omidria was added to the infusion bag.  Beginning with a 103 degree blade a paracentesis port was made at the surgeon's 2 o'clock position. The anterior chamber was then filled with 1% non-preserved lidocaine with epinepherine. This was followed by filling the anterior chamber with Provisc.  A 2.32mm keratome blade was used to make a clear corneal incision at the temporal limbus.  A bent cystatome needle was used to create a continuous tear capsulotomy. Hydrodissection was performed with balanced salt solution on a Fine canula. The lens nucleus was then removed using the phacoemulsification handpiece. Residual cortex was removed with the I&A handpiece. The anterior chamber and capsular bag were refilled with Provisc. A posterior chamber intraocular lens was placed into the capsular bag with it's injector. The implant was positioned with the Kuglan hook. The Provisc was then removed from the anterior chamber and capsular bag with the I&A handpiece. Stromal hydration of the main incision and paracentesis port was performed with BSS on a Fine canula. The wounds were tested for leak which was negative. The patient tolerated the procedure well. There were no operative complications. The patient was then transferred to the recovery room in stable condition.  Complications: None  Specimen:  None  EBL: None  Prosthetic device: Hoya iSert 250, power 21.0 D, SN [id number].

## 2015-06-17 NOTE — Anesthesia Postprocedure Evaluation (Signed)
Anesthesia Post Note  Patient: [name] [name]  Procedure(s) Performed: Procedure(s) (LRB): CATARACT EXTRACTION PHACO AND INTRAOCULAR LENS PLACEMENT RIGHT EYE CDE=11.09 (Right)  Patient location during evaluation: Short Stay Anesthesia Type: MAC Level of consciousness: awake and alert, oriented and patient cooperative Pain management: pain level controlled Vital Signs Assessment: post-procedure vital signs reviewed and stable Respiratory status: spontaneous breathing and nonlabored ventilation Cardiovascular status: stable Postop Assessment: no signs of nausea or vomiting Anesthetic complications: no    Last Vitals:  Filed Vitals:   06/17/15 1140 06/17/15 1145  BP:  124/57  Pulse:    Temp:    Resp: 11 11    Last Pain: There were no vitals filed for this visit.               Kiree Dejarnette J

## 2015-06-18 ENCOUNTER — Encounter (HOSPITAL_COMMUNITY): Payer: Self-pay | Admitting: Ophthalmology

## 2015-06-29 ENCOUNTER — Other Ambulatory Visit: Payer: Self-pay | Admitting: Cardiology

## 2015-08-03 DIAGNOSIS — I10 Essential (primary) hypertension: Secondary | ICD-10-CM | POA: Diagnosis not present

## 2015-08-03 DIAGNOSIS — E782 Mixed hyperlipidemia: Secondary | ICD-10-CM | POA: Diagnosis not present

## 2015-08-03 DIAGNOSIS — E1122 Type 2 diabetes mellitus with diabetic chronic kidney disease: Secondary | ICD-10-CM | POA: Diagnosis not present

## 2015-08-03 DIAGNOSIS — M1000 Idiopathic gout, unspecified site: Secondary | ICD-10-CM | POA: Diagnosis not present

## 2015-08-07 ENCOUNTER — Other Ambulatory Visit: Payer: Self-pay | Admitting: Cardiology

## 2015-08-12 DIAGNOSIS — J301 Allergic rhinitis due to pollen: Secondary | ICD-10-CM | POA: Diagnosis not present

## 2015-08-12 DIAGNOSIS — I10 Essential (primary) hypertension: Secondary | ICD-10-CM | POA: Diagnosis not present

## 2015-08-12 DIAGNOSIS — E782 Mixed hyperlipidemia: Secondary | ICD-10-CM | POA: Diagnosis not present

## 2015-08-12 DIAGNOSIS — M1000 Idiopathic gout, unspecified site: Secondary | ICD-10-CM | POA: Diagnosis not present

## 2015-08-12 DIAGNOSIS — E1122 Type 2 diabetes mellitus with diabetic chronic kidney disease: Secondary | ICD-10-CM | POA: Diagnosis not present

## 2015-08-12 DIAGNOSIS — G2581 Restless legs syndrome: Secondary | ICD-10-CM | POA: Diagnosis not present

## 2015-09-17 DIAGNOSIS — Z8546 Personal history of malignant neoplasm of prostate: Secondary | ICD-10-CM | POA: Diagnosis not present

## 2015-09-17 DIAGNOSIS — C61 Malignant neoplasm of prostate: Secondary | ICD-10-CM | POA: Diagnosis not present

## 2015-10-08 ENCOUNTER — Other Ambulatory Visit: Payer: Self-pay | Admitting: Cardiology

## 2015-10-17 NOTE — Progress Notes (Signed)
Cardiology Office Note  Date: 10/18/2015   ID: [name] [name], DOB 1940/06/21, MRN [id number]  PCP: Gar Ponto, MD  Primary Cardiologist: Rozann Lesches, MD   Chief Complaint  Patient presents with  . Paroxysmal atrial fibrillation  . Pericardial Effusion    History of Present Illness: [name] [name] is a 75 y.o. male last seen in January.He presents for a routine visit. Reports no palpitations or chest pain in the interim, has been pleased with his medical therapy and control of atrial fibrillation.  I reviewed interval records, he underwent bilateral cataract surgery back in March. Reports improved vision.  Blood pressure was elevated today more than usual. States that he had "rushing around." I asked him to keep an eye on blood pressure readings at home in case he needs further adjustments. He continues to follow with Dr. [name].  Most recent cardiac structural and ischemic testing is outlined below. He continues to walk for exercise, goes down to the [place] near the [place]. This is about a 3-1/2 mile walk.  I reviewed his ECG today which shows sinus bradycardia with low voltage and QRS 100 ms, normal QTc.  Past Medical History:  Diagnosis Date  . Leg fracture    Distal tibial/fibula 2003  . Paroxysmal atrial fibrillation (HCC)   . Pericardial effusion    Idiopathic, recurrent pericardial effusion s/p pericardial window.  . Prostate cancer ([place])    Seed implants 2007  . Supraventricular tachycardia ([place])   . Temporal arteritis [facility])     Past Surgical History:  Procedure Laterality Date  . CATARACT EXTRACTION W/PHACO Left 06/07/2015   Procedure: CATARACT EXTRACTION PHACO AND INTRAOCULAR LENS PLACEMENT LEFT EYE CDE=8.00;  Surgeon: Tonny Branch, MD;  Location: [facility];  Service: Ophthalmology;  Laterality: Left;  . CATARACT EXTRACTION W/PHACO Right 06/17/2015   Procedure: CATARACT EXTRACTION PHACO AND INTRAOCULAR LENS PLACEMENT RIGHT EYE CDE=11.09;  Surgeon: Tonny Branch,  MD;  Location: [facility];  Service: Ophthalmology;  Laterality: Right;  . PERICARDIAL WINDOW  December 2008    Current Outpatient Prescriptions  Medication Sig Dispense Refill  . acetaminophen (TYLENOL) 500 MG tablet Take 1,000 mg by mouth every 6 (six) hours as needed for moderate pain.    Marland Kitchen allopurinol (ZYLOPRIM) 300 MG tablet Take 450 mg by mouth daily.     Marland Kitchen aspirin 81 MG tablet Take 81 mg by mouth daily.    . benazepril (LOTENSIN) 20 MG tablet Take 20 mg by mouth daily.    . chlorpheniramine-HYDROcodone (TUSSIONEX) 10-8 MG/5ML LQCR Take 2.5 mLs by mouth as needed.     . chlorthalidone (HYGROTON) 25 MG tablet TAKE ONE TABLET BY MOUTH ONCE DAILY 30 tablet 3  . flecainide (TAMBOCOR) 50 MG tablet Take 1 tablet (50 mg total) by mouth 2 (two) times daily. 180 tablet 3  . latanoprost (XALATAN) 0.005 % ophthalmic solution Place 1 drop into both eyes at bedtime.    . metoprolol succinate (TOPROL-XL) 50 MG 24 hr tablet TAKE ONE TABLET BY MOUTH ONCE DAILY WITH FOOD OR IMMEDIATELY FOLLOWING A MEAL 30 tablet 6  . Multiple Vitamin (MULTIVITAMIN) tablet Take 1 tablet by mouth daily.    . pantoprazole (PROTONIX) 40 MG tablet TAKE ONE TABLET BY MOUTH ONCE DAILY 30 tablet 3   No current facility-administered medications for this visit.    Allergies:  Amiodarone   Social History: The patient  reports that he quit smoking about 55 years ago. His smoking use included Cigarettes. He started smoking about 54  years ago. He has a 0.90 pack-year smoking history. He has never used smokeless tobacco. He reports that he does not drink alcohol or use drugs.   ROS:  Please see the history of present illness. Otherwise, complete review of systems is positive for NYHA class II dyspnea.  All other systems are reviewed and negative.   Physical Exam: VS:  BP (!) 164/75   Pulse (!) 53   Ht 6' (1.829 m)   Wt 219 lb (99.3 kg)   BMI 29.70 kg/m , BMI Body mass index is 29.7 kg/m.  Wt Readings from Last 3 Encounters:    10/18/15 219 lb (99.3 kg)  06/17/15 221 lb (100.2 kg)  06/02/15 221 lb (100.2 kg)    General: Elderly male, appears comfortable at rest. HEENT: Conjunctiva and lids normal, oropharynx clear. Neck: Supple, no elevated JVP or carotid bruits, no thyromegaly. Lungs: Clear to auscultation, nonlabored breathing at rest. Cardiac: Regular rate and rhythm, no S3, soft systolic murmur, no pericardial rub. Abdomen: Soft, nontender, bowel sounds present, no guarding or rebound. Extremities: Trace ankle edema, distal pulses 2+. Skin: Warm and dry. Musculoskeletal: No kyphosis. Neuropsychiatric: Alert and oriented 3, affect appropriate.  ECG: I personally reviewed the tracing from 04/07/2015 which showed sinus bradycardia with nonspecific ST changes.  Recent Labwork: 06/02/2015: BUN 31; Creatinine, Ser 1.22; Hemoglobin 13.5; Platelets 181; Potassium 3.9; Sodium 138   Other Studies Reviewed Today:  Echocardiogram 11/04/2014: Study Conclusions  - Left ventricle: The cavity size was normal. Wall thickness was increased in a pattern of severe LVH. Systolic function was vigorous. The estimated ejection fraction was in the range of 65% to 70%. Wall motion was normal; there were no regional wall motion abnormalities. Doppler parameters are consistent with abnormal left ventricular relaxation (grade 1 diastolic dysfunction). - Aortic valve: Mildly calcified annulus. Trileaflet; mildly thickened leaflets. Valve area (VTI): 3.57 cm^2. Valve area (Vmax): 3.7 cm^2. Valve area (Vmean): 3.3 cm^2. - Mitral valve: Mildly calcified annulus. Normal thickness leaflets . - Left atrium: The atrium was severely dilated. - Right atrium: The atrium was mildly dilated. - Pericardium: There is a small circumferential pericardial effusion.  Lexiscan Cardiolite 01/20/2014: FINDINGS: Pharmacological stress  Baseline EKG shows sinus bradycardia. After injection heart rate increased from 54 beats  per min up to 77 beats per min, and blood pressure increased from 118/58 up to 148/62. The test was stopped after injection was complete, the patient did not experience any chest pain. Post-injection EKG showed no specific ischemic changes and no significant arrhythmias.  Perfusion: There is a moderate-sized mild intensity inferior wall defect seen in the pre injection images. A similar slightly less intense defect is seen in the post-injection images. The inferior wall has normal wall motion, overall findings most consistent with sub- diaphragmatic attenuation. There are no other myocardial perfusion defects.  Wall Motion: Normal left ventricular wall motion. No left ventricular dilation.  Left Ventricular Ejection Fraction: 64 %  End diastolic volume 65 ml  End systolic volume 23 ml  IMPRESSION: 1. No reversible ischemia or infarction.  2. Normal left ventricular wall motion.  3. Left ventricular ejection fraction 64%  4. Low-risk stress test findings*.  Assessment and Plan:  1. Paroxysmal atrial fibrillation with CHADSVASC score of 2. He is symptomatically doing quite well on flecainide and Toprol-XL, no obvious recurrences. He continues to prefer aspirin to anticoagulation. We will continue to address this over time.  2. Elevated blood pressure. Higher than usual. I asked him to keep a close  eye on this, also discussed limiting sodium intake if possible. He is already on Toprol-XL, Lotensin, and chlorthalidone. Keep follow-up with Dr. [name].  3. History of pericardial effusion status post previous pericardial window, small residual by echocardiogram in August 2016. He is asymptomatic.  4. Chronic sinus bradycardia on medical therapy, not clearly symptomatic. ECG today reviewed.  Current medicines were reviewed with the patient today.   Orders Placed This Encounter  Procedures  . EKG 12-Lead    Disposition: Follow-up with me in 6  months.  Signed, Satira Sark, MD, [facility] 10/18/2015 8:29 AM    [facility] [place], [place], [place] [address] Phone: [phone]; Fax: [phone]

## 2015-10-18 ENCOUNTER — Ambulatory Visit (INDEPENDENT_AMBULATORY_CARE_PROVIDER_SITE_OTHER): Payer: Medicare Other | Admitting: Cardiology

## 2015-10-18 ENCOUNTER — Encounter: Payer: Self-pay | Admitting: Cardiology

## 2015-10-18 VITALS — BP 164/75 | HR 53 | Ht 72.0 in | Wt 219.0 lb

## 2015-10-18 DIAGNOSIS — I319 Disease of pericardium, unspecified: Secondary | ICD-10-CM

## 2015-10-18 DIAGNOSIS — I3139 Other pericardial effusion (noninflammatory): Secondary | ICD-10-CM

## 2015-10-18 DIAGNOSIS — I10 Essential (primary) hypertension: Secondary | ICD-10-CM | POA: Diagnosis not present

## 2015-10-18 DIAGNOSIS — R001 Bradycardia, unspecified: Secondary | ICD-10-CM

## 2015-10-18 DIAGNOSIS — I480 Paroxysmal atrial fibrillation: Secondary | ICD-10-CM

## 2015-10-18 DIAGNOSIS — I313 Pericardial effusion (noninflammatory): Secondary | ICD-10-CM

## 2015-10-18 NOTE — Patient Instructions (Signed)

## 2015-10-20 DIAGNOSIS — H401131 Primary open-angle glaucoma, bilateral, mild stage: Secondary | ICD-10-CM | POA: Diagnosis not present

## 2015-11-23 ENCOUNTER — Other Ambulatory Visit: Payer: Self-pay

## 2015-12-08 DIAGNOSIS — E1165 Type 2 diabetes mellitus with hyperglycemia: Secondary | ICD-10-CM | POA: Insufficient documentation

## 2015-12-08 DIAGNOSIS — IMO0002 Reserved for concepts with insufficient information to code with codable children: Secondary | ICD-10-CM | POA: Insufficient documentation

## 2015-12-09 DIAGNOSIS — E1165 Type 2 diabetes mellitus with hyperglycemia: Secondary | ICD-10-CM | POA: Diagnosis not present

## 2015-12-09 DIAGNOSIS — E782 Mixed hyperlipidemia: Secondary | ICD-10-CM | POA: Diagnosis not present

## 2015-12-09 DIAGNOSIS — I10 Essential (primary) hypertension: Secondary | ICD-10-CM | POA: Diagnosis not present

## 2015-12-09 DIAGNOSIS — C61 Malignant neoplasm of prostate: Secondary | ICD-10-CM | POA: Diagnosis not present

## 2015-12-09 DIAGNOSIS — E1122 Type 2 diabetes mellitus with diabetic chronic kidney disease: Secondary | ICD-10-CM | POA: Diagnosis not present

## 2015-12-14 DIAGNOSIS — M1000 Idiopathic gout, unspecified site: Secondary | ICD-10-CM | POA: Diagnosis not present

## 2015-12-14 DIAGNOSIS — I10 Essential (primary) hypertension: Secondary | ICD-10-CM | POA: Diagnosis not present

## 2015-12-14 DIAGNOSIS — G2581 Restless legs syndrome: Secondary | ICD-10-CM | POA: Diagnosis not present

## 2015-12-14 DIAGNOSIS — E782 Mixed hyperlipidemia: Secondary | ICD-10-CM | POA: Diagnosis not present

## 2015-12-14 DIAGNOSIS — E1122 Type 2 diabetes mellitus with diabetic chronic kidney disease: Secondary | ICD-10-CM | POA: Diagnosis not present

## 2015-12-14 DIAGNOSIS — Z6830 Body mass index (BMI) 30.0-30.9, adult: Secondary | ICD-10-CM | POA: Diagnosis not present

## 2015-12-14 DIAGNOSIS — Z23 Encounter for immunization: Secondary | ICD-10-CM | POA: Diagnosis not present

## 2015-12-14 DIAGNOSIS — J301 Allergic rhinitis due to pollen: Secondary | ICD-10-CM | POA: Diagnosis not present

## 2015-12-29 DIAGNOSIS — M25531 Pain in right wrist: Secondary | ICD-10-CM | POA: Diagnosis not present

## 2015-12-29 DIAGNOSIS — M19031 Primary osteoarthritis, right wrist: Secondary | ICD-10-CM | POA: Diagnosis not present

## 2016-01-13 DIAGNOSIS — H401131 Primary open-angle glaucoma, bilateral, mild stage: Secondary | ICD-10-CM | POA: Diagnosis not present

## 2016-01-19 ENCOUNTER — Other Ambulatory Visit: Payer: Self-pay | Admitting: Cardiology

## 2016-03-30 ENCOUNTER — Other Ambulatory Visit: Payer: Self-pay | Admitting: Cardiology

## 2016-03-31 ENCOUNTER — Other Ambulatory Visit: Payer: Self-pay | Admitting: Cardiology

## 2016-04-11 DIAGNOSIS — E1122 Type 2 diabetes mellitus with diabetic chronic kidney disease: Secondary | ICD-10-CM | POA: Diagnosis not present

## 2016-04-11 DIAGNOSIS — Z9189 Other specified personal risk factors, not elsewhere classified: Secondary | ICD-10-CM | POA: Diagnosis not present

## 2016-04-11 DIAGNOSIS — E782 Mixed hyperlipidemia: Secondary | ICD-10-CM | POA: Diagnosis not present

## 2016-04-11 DIAGNOSIS — C61 Malignant neoplasm of prostate: Secondary | ICD-10-CM | POA: Diagnosis not present

## 2016-04-12 DIAGNOSIS — E1122 Type 2 diabetes mellitus with diabetic chronic kidney disease: Secondary | ICD-10-CM | POA: Insufficient documentation

## 2016-04-13 DIAGNOSIS — I10 Essential (primary) hypertension: Secondary | ICD-10-CM | POA: Diagnosis not present

## 2016-04-13 DIAGNOSIS — Z6831 Body mass index (BMI) 31.0-31.9, adult: Secondary | ICD-10-CM | POA: Diagnosis not present

## 2016-04-13 DIAGNOSIS — Z1212 Encounter for screening for malignant neoplasm of rectum: Secondary | ICD-10-CM | POA: Diagnosis not present

## 2016-04-13 DIAGNOSIS — M1000 Idiopathic gout, unspecified site: Secondary | ICD-10-CM | POA: Diagnosis not present

## 2016-04-13 DIAGNOSIS — G2581 Restless legs syndrome: Secondary | ICD-10-CM | POA: Diagnosis not present

## 2016-04-13 DIAGNOSIS — E782 Mixed hyperlipidemia: Secondary | ICD-10-CM | POA: Diagnosis not present

## 2016-04-13 DIAGNOSIS — Z0001 Encounter for general adult medical examination with abnormal findings: Secondary | ICD-10-CM | POA: Diagnosis not present

## 2016-04-13 DIAGNOSIS — E1122 Type 2 diabetes mellitus with diabetic chronic kidney disease: Secondary | ICD-10-CM | POA: Diagnosis not present

## 2016-04-14 ENCOUNTER — Other Ambulatory Visit: Payer: Self-pay | Admitting: Cardiology

## 2016-04-26 NOTE — Progress Notes (Signed)
Cardiology Office Note  Date: 04/27/2016   ID: [name] [name], DOB [birth year], MRN [id number]  PCP: Gar Ponto, MD  Primary Cardiologist: Rozann Lesches, MD   Chief Complaint  Patient presents with  . PAF    History of Present Illness: [name] [name] is a 76 y.o. male last seen in July 2017. He presents for a follow-up visit. States that he feels well, continues to walk regularly for exercise, usually 3 miles at a time. He does not report any exertional chest pain or palpitations. His atrial fibrillation has been well controlled on flecainide.  Echocardiogram from 2016 is outlined below. I reviewed his ECG today which shows sinus bradycardia with low voltage, R' in lead V1 and V2, poor R-wave progression, QRS duration 104 ms.  CHADSVASC score is 2, he has preferred aspirin to anticoagulation. This remains the case today.  He had a physical recently with Dr. [name], we are requesting lab work to review.  Past Medical History:  Diagnosis Date  . Leg fracture    Distal tibial/fibula 2003  . Paroxysmal atrial fibrillation (HCC)   . Pericardial effusion    Idiopathic, recurrent pericardial effusion s/p pericardial window.  . Prostate cancer ([place])    Seed implants 2007  . Supraventricular tachycardia ([place])   . Temporal arteritis [facility])     Past Surgical History:  Procedure Laterality Date  . CATARACT EXTRACTION W/PHACO Left 06/07/2015   Procedure: CATARACT EXTRACTION PHACO AND INTRAOCULAR LENS PLACEMENT LEFT EYE CDE=8.00;  Surgeon: Tonny Branch, MD;  Location: [facility];  Service: Ophthalmology;  Laterality: Left;  . CATARACT EXTRACTION W/PHACO Right 06/17/2015   Procedure: CATARACT EXTRACTION PHACO AND INTRAOCULAR LENS PLACEMENT RIGHT EYE CDE=11.09;  Surgeon: Tonny Branch, MD;  Location: [facility];  Service: Ophthalmology;  Laterality: Right;  . PERICARDIAL WINDOW  December 2008    Current Outpatient Prescriptions  Medication Sig Dispense Refill  . acetaminophen (TYLENOL) 500 MG  tablet Take 1,000 mg by mouth every 6 (six) hours as needed for moderate pain.    Marland Kitchen allopurinol (ZYLOPRIM) 300 MG tablet Take 450 mg by mouth daily.     Marland Kitchen aspirin 81 MG tablet Take 81 mg by mouth daily.    . benazepril (LOTENSIN) 20 MG tablet Take 20 mg by mouth daily.    . chlorpheniramine-HYDROcodone (TUSSIONEX) 10-8 MG/5ML LQCR Take 2.5 mLs by mouth as needed.     . chlorthalidone (HYGROTON) 25 MG tablet TAKE ONE TABLET BY MOUTH ONCE DAILY 30 tablet 3  . flecainide (TAMBOCOR) 50 MG tablet TAKE ONE TABLET BY MOUTH TWICE DAILY 180 tablet 0  . flecainide (TAMBOCOR) 50 MG tablet TAKE ONE TABLET BY MOUTH TWICE DAILY 180 tablet 3  . latanoprost (XALATAN) 0.005 % ophthalmic solution Place 1 drop into both eyes at bedtime.    . metoprolol succinate (TOPROL-XL) 50 MG 24 hr tablet TAKE ONE TABLET BY MOUTH ONCE DAILY WITH FOOD OR IMMEDIATELY FOLLOWING A MEAL 30 tablet 6  . Multiple Vitamin (MULTIVITAMIN) tablet Take 1 tablet by mouth daily.    . pantoprazole (PROTONIX) 40 MG tablet TAKE ONE TABLET BY MOUTH ONCE DAILY 30 tablet 3   No current facility-administered medications for this visit.    Allergies:  Amiodarone   Social History: The patient  reports that he quit smoking about 56 years ago. His smoking use included Cigarettes. He started smoking about 57 years ago. He has a 0.90 pack-year smoking history. He has never used smokeless tobacco. He reports that he does not  drink alcohol or use drugs.   ROS:  Please see the history of present illness. Otherwise, complete review of systems is positive for none.  All other systems are reviewed and negative.   Physical Exam: VS:  BP (!) 150/82   Pulse (!) 55   Ht 6\' 1"  (1.854 m)   Wt 224 lb (101.6 kg)   SpO2 95%   BMI 29.55 kg/m , BMI Body mass index is 29.55 kg/m.  Wt Readings from Last 3 Encounters:  04/27/16 224 lb (101.6 kg)  10/18/15 219 lb (99.3 kg)  06/17/15 221 lb (100.2 kg)    General: Elderly male, appears comfortable at  rest. HEENT: Conjunctiva and lids normal, oropharynx clear. Neck: Supple, no elevated JVP or carotid bruits, no thyromegaly. Lungs: Clear to auscultation, nonlabored breathing at rest. Cardiac: Regular rate and rhythm, no S3, 2/6 systolic murmur, no pericardial rub. Abdomen: Soft, nontender, bowel sounds present, no guarding or rebound. Extremities: Trace ankle edema, distal pulses 2+. Skin: Warm and dry. Musculoskeletal: No kyphosis. Neuropsychiatric: Alert and oriented 3, affect appropriate.  ECG: I personally reviewed the tracing from 10/18/2015 which showed sinus bradycardia with low voltage and nonspecific ST changes. QRS duration 100 ms.  Recent Labwork: 06/02/2015: BUN 31; Creatinine, Ser 1.22; Hemoglobin 13.5; Platelets 181; Potassium 3.9; Sodium 138   Other Studies Reviewed Today:  Echocardiogram 11/04/2014: Study Conclusions  - Left ventricle: The cavity size was normal. Wall thickness was increased in a pattern of severe LVH. Systolic function was vigorous. The estimated ejection fraction was in the range of 65% to 70%. Wall motion was normal; there were no regional wall motion abnormalities. Doppler parameters are consistent with abnormal left ventricular relaxation (grade 1 diastolic dysfunction). - Aortic valve: Mildly calcified annulus. Trileaflet; mildly thickened leaflets. Valve area (VTI): 3.57 cm^2. Valve area (Vmax): 3.7 cm^2. Valve area (Vmean): 3.3 cm^2. - Mitral valve: Mildly calcified annulus. Normal thickness leaflets. - Left atrium: The atrium was severely dilated. - Right atrium: The atrium was mildly dilated. - Pericardium: There is a small circumferential pericardial effusion.  Lexiscan Cardiolite 01/20/2014: FINDINGS: Pharmacological stress  Baseline EKG shows sinus bradycardia. After injection heart rate increased from 54 beats per min up to 77 beats per min, and blood pressure increased from 118/58 up to 148/62. The test was  stopped after injection was complete, the patient did not experience any chest pain. Post-injection EKG showed no specific ischemic changes and no significant arrhythmias.  Perfusion: There is a moderate-sized mild intensity inferior wall defect seen in the pre injection images. A similar slightly less intense defect is seen in the post-injection images. The inferior wall has normal wall motion, overall findings most consistent with sub- diaphragmatic attenuation. There are no other myocardial perfusion defects.  Wall Motion: Normal left ventricular wall motion. No left ventricular dilation.  Left Ventricular Ejection Fraction: 64 %  End diastolic volume 65 ml  End systolic volume 23 ml  IMPRESSION: 1. No reversible ischemia or infarction.  2. Normal left ventricular wall motion.  3. Left ventricular ejection fraction 64%  4. Low-risk stress test findings*.  Assessment and Plan:  1. Paroxysmal atrial fibrillation, symptomatically well controlled on flecainide and Toprol-XL. He has declined anticoagulation so far, CHADSVASC score is 2. He continues on aspirin. ECG reviewed today. Requesting most recent lab work from Dr. [name] to review.  2. Idiopathic pericardial effusion status post previous pericardial window. Small residual effusion by last echocardiogram. He is asymptomatic.  3. Elevated blood pressure. He is on Lotensin  and Toprol-XL. Continue walking regimen, sodium restriction.  4. Sinus bradycardia on beta blocker, asymptomatic.  Current medicines were reviewed with the patient today.   Orders Placed This Encounter  Procedures  . EKG 12-Lead    Disposition: Follow-up in 6 months.  Signed, Satira Sark, MD, [facility] 04/27/2016 8:17 AM    [facility] [place], [place], [place] [address] Phone: [phone]; Fax: [phone]

## 2016-04-27 ENCOUNTER — Encounter: Payer: Self-pay | Admitting: Cardiology

## 2016-04-27 ENCOUNTER — Ambulatory Visit (INDEPENDENT_AMBULATORY_CARE_PROVIDER_SITE_OTHER): Payer: Medicare Other | Admitting: Cardiology

## 2016-04-27 VITALS — BP 150/82 | HR 55 | Ht 73.0 in | Wt 224.0 lb

## 2016-04-27 DIAGNOSIS — I313 Pericardial effusion (noninflammatory): Secondary | ICD-10-CM | POA: Diagnosis not present

## 2016-04-27 DIAGNOSIS — R001 Bradycardia, unspecified: Secondary | ICD-10-CM

## 2016-04-27 DIAGNOSIS — I10 Essential (primary) hypertension: Secondary | ICD-10-CM

## 2016-04-27 DIAGNOSIS — I3139 Other pericardial effusion (noninflammatory): Secondary | ICD-10-CM

## 2016-04-27 DIAGNOSIS — I480 Paroxysmal atrial fibrillation: Secondary | ICD-10-CM | POA: Diagnosis not present

## 2016-04-27 NOTE — Patient Instructions (Signed)
Your physician wants you to follow-up in: 6 months with Dr. Mcdowell. You will receive a reminder letter in the mail two months in advance. If you don't receive a letter, please call our office to schedule the follow-up appointment.   Your physician recommends that you continue on your current medications as directed. Please refer to the Current Medication list given to you today.    Thank you for choosing [facility]!!       

## 2016-07-13 DIAGNOSIS — J0101 Acute recurrent maxillary sinusitis: Secondary | ICD-10-CM | POA: Diagnosis not present

## 2016-07-13 DIAGNOSIS — R05 Cough: Secondary | ICD-10-CM | POA: Diagnosis not present

## 2016-07-13 DIAGNOSIS — Z6831 Body mass index (BMI) 31.0-31.9, adult: Secondary | ICD-10-CM | POA: Diagnosis not present

## 2016-07-25 DIAGNOSIS — L03031 Cellulitis of right toe: Secondary | ICD-10-CM | POA: Diagnosis not present

## 2016-07-25 DIAGNOSIS — M79674 Pain in right toe(s): Secondary | ICD-10-CM | POA: Diagnosis not present

## 2016-07-25 DIAGNOSIS — M79671 Pain in right foot: Secondary | ICD-10-CM | POA: Diagnosis not present

## 2016-07-25 DIAGNOSIS — L600 Ingrowing nail: Secondary | ICD-10-CM | POA: Diagnosis not present

## 2016-08-07 DIAGNOSIS — I10 Essential (primary) hypertension: Secondary | ICD-10-CM | POA: Diagnosis not present

## 2016-08-07 DIAGNOSIS — E1122 Type 2 diabetes mellitus with diabetic chronic kidney disease: Secondary | ICD-10-CM | POA: Diagnosis not present

## 2016-08-07 DIAGNOSIS — Z9189 Other specified personal risk factors, not elsewhere classified: Secondary | ICD-10-CM | POA: Diagnosis not present

## 2016-08-07 DIAGNOSIS — E782 Mixed hyperlipidemia: Secondary | ICD-10-CM | POA: Diagnosis not present

## 2016-08-07 DIAGNOSIS — E1165 Type 2 diabetes mellitus with hyperglycemia: Secondary | ICD-10-CM | POA: Diagnosis not present

## 2016-08-09 DIAGNOSIS — M1000 Idiopathic gout, unspecified site: Secondary | ICD-10-CM | POA: Diagnosis not present

## 2016-08-09 DIAGNOSIS — G2581 Restless legs syndrome: Secondary | ICD-10-CM | POA: Diagnosis not present

## 2016-08-09 DIAGNOSIS — Z6830 Body mass index (BMI) 30.0-30.9, adult: Secondary | ICD-10-CM | POA: Diagnosis not present

## 2016-08-09 DIAGNOSIS — J301 Allergic rhinitis due to pollen: Secondary | ICD-10-CM | POA: Diagnosis not present

## 2016-08-09 DIAGNOSIS — I10 Essential (primary) hypertension: Secondary | ICD-10-CM | POA: Diagnosis not present

## 2016-08-09 DIAGNOSIS — E782 Mixed hyperlipidemia: Secondary | ICD-10-CM | POA: Diagnosis not present

## 2016-08-09 DIAGNOSIS — E1122 Type 2 diabetes mellitus with diabetic chronic kidney disease: Secondary | ICD-10-CM | POA: Diagnosis not present

## 2016-08-15 DIAGNOSIS — M79671 Pain in right foot: Secondary | ICD-10-CM | POA: Diagnosis not present

## 2016-08-15 DIAGNOSIS — L03031 Cellulitis of right toe: Secondary | ICD-10-CM | POA: Diagnosis not present

## 2016-08-15 DIAGNOSIS — L600 Ingrowing nail: Secondary | ICD-10-CM | POA: Diagnosis not present

## 2016-08-15 DIAGNOSIS — M79674 Pain in right toe(s): Secondary | ICD-10-CM | POA: Diagnosis not present

## 2016-08-23 ENCOUNTER — Other Ambulatory Visit: Payer: Self-pay | Admitting: Cardiology

## 2016-10-24 ENCOUNTER — Encounter: Payer: Self-pay | Admitting: *Deleted

## 2016-10-24 NOTE — Progress Notes (Signed)
Cardiology Office Note  Date: 10/25/2016   ID: [name] [name], DOB 1940-11-18, MRN [id number]  PCP: Caryl Bis, MD  Primary Cardiologist: Rozann Lesches, MD   Chief Complaint  Patient presents with  . Atrial Fibrillation    History of Present Illness: [name] [name] is a 76 y.o. male last seen in February. He presents for a routine follow-up visit. Tells me that he has been doing well. He walks 6 days a week on the [place], reports no palpitations or exertional chest pain.  CHADSVASC score is 3, he has preferred aspirin to anticoagulation.   I reviewed his medications. Current regimen includes aspirin, Lotensin, chlorthalidone, flecainide, and Toprol-XL.  I reviewed interval lab work from January per Dr. [name].  Past Medical History:  Diagnosis Date  . Hypertension   . Leg fracture    Distal tibial/fibula 2003  . Paroxysmal atrial fibrillation (HCC)   . Pericardial effusion    Idiopathic, recurrent pericardial effusion s/p pericardial window.  . Prostate cancer ([place])    Seed implants 2007  . Supraventricular tachycardia ([place])   . Temporal arteritis [facility])     Past Surgical History:  Procedure Laterality Date  . CATARACT EXTRACTION W/PHACO Left 06/07/2015   Procedure: CATARACT EXTRACTION PHACO AND INTRAOCULAR LENS PLACEMENT LEFT EYE CDE=8.00;  Surgeon: Tonny Branch, MD;  Location: [facility];  Service: Ophthalmology;  Laterality: Left;  . CATARACT EXTRACTION W/PHACO Right 06/17/2015   Procedure: CATARACT EXTRACTION PHACO AND INTRAOCULAR LENS PLACEMENT RIGHT EYE CDE=11.09;  Surgeon: Tonny Branch, MD;  Location: [facility];  Service: Ophthalmology;  Laterality: Right;  . PERICARDIAL WINDOW  December 2008    Current Outpatient Prescriptions  Medication Sig Dispense Refill  . acetaminophen (TYLENOL) 500 MG tablet Take 1,000 mg by mouth every 6 (six) hours as needed for moderate pain.    Marland Kitchen allopurinol (ZYLOPRIM) 300 MG tablet Take 450 mg by mouth daily.     Marland Kitchen aspirin 81 MG tablet  Take 81 mg by mouth daily.    . benazepril (LOTENSIN) 20 MG tablet Take 20 mg by mouth daily.    . chlorpheniramine-HYDROcodone (TUSSIONEX) 10-8 MG/5ML LQCR Take 2.5 mLs by mouth as needed.     . chlorthalidone (HYGROTON) 25 MG tablet TAKE ONE TABLET BY MOUTH ONCE DAILY 30 tablet 3  . flecainide (TAMBOCOR) 50 MG tablet TAKE ONE TABLET BY MOUTH TWICE DAILY 180 tablet 3  . latanoprost (XALATAN) 0.005 % ophthalmic solution Place 1 drop into both eyes at bedtime.    . metoprolol succinate (TOPROL-XL) 50 MG 24 hr tablet TAKE ONE TABLET BY MOUTH ONCE DAILY WITH FOOD OR  IMMEDIATELY  FOLLOWING  A  MEAL 30 tablet 6  . Multiple Vitamin (MULTIVITAMIN) tablet Take 1 tablet by mouth daily.    . pantoprazole (PROTONIX) 40 MG tablet TAKE ONE TABLET BY MOUTH ONCE DAILY 30 tablet 3   No current facility-administered medications for this visit.    Allergies:  Amiodarone   Social History: The patient  reports that he quit smoking about 56 years ago. His smoking use included Cigarettes. He started smoking about 58 years ago. He has a 0.90 pack-year smoking history. He has never used smokeless tobacco. He reports that he does not drink alcohol or use drugs.   ROS:  Please see the history of present illness. Otherwise, complete review of systems is positive for none.  All other systems are reviewed and negative.   Physical Exam: VS:  BP 138/70   Pulse 60  Ht 6\' 1"  (1.854 m)   Wt 220 lb (99.8 kg)   SpO2 96%   BMI 29.03 kg/m , BMI Body mass index is 29.03 kg/m.  Wt Readings from Last 3 Encounters:  10/25/16 220 lb (99.8 kg)  04/27/16 224 lb (101.6 kg)  10/18/15 219 lb (99.3 kg)    General: Elderly male, appears comfortable at rest. HEENT: Conjunctiva and lids normal, oropharynx clear. Neck: Supple, no elevated JVP or carotid bruits, no thyromegaly. Lungs: Clear to auscultation, nonlabored breathing at rest. Cardiac: Regular rate and rhythm, no S3, 2/6 systolic murmur, no pericardial rub. Abdomen:  Soft, nontender, bowel sounds present, no guarding or rebound. Extremities: Trace ankle edema, distal pulses 2+. Skin: Warm and dry. Musculoskeletal: No kyphosis. Neuropsychiatric: Alert and oriented 3, affect appropriate.  ECG: I personally reviewed the tracing from 04/27/2016 which showed sinus bradycardia with low voltage, incomplete right bundle-branch block and poor R-wave progression.  Recent Labwork:  January 2018: Cholesterol 162, triglycerides 71, HDL 40, LDL 108, BUN 17, creatinine 0.9, potassium 4.6, AST 23, ALT 20, hemoglobin 13.6, platelets 204, hemoglobin A1c 6.2  Other Studies Reviewed Today:  Echocardiogram 11/04/2014: Study Conclusions  - Left ventricle: The cavity size was normal. Wall thickness was increased in a pattern of severe LVH. Systolic function was vigorous. The estimated ejection fraction was in the range of 65% to 70%. Wall motion was normal; there were no regional wall motion abnormalities. Doppler parameters are consistent with abnormal left ventricular relaxation (grade 1 diastolic dysfunction). - Aortic valve: Mildly calcified annulus. Trileaflet; mildly thickened leaflets. Valve area (VTI): 3.57 cm^2. Valve area (Vmax): 3.7 cm^2. Valve area (Vmean): 3.3 cm^2. - Mitral valve: Mildly calcified annulus. Normal thickness leaflets. - Left atrium: The atrium was severely dilated. - Right atrium: The atrium was mildly dilated. - Pericardium: There is a small circumferential pericardial effusion.  Lexiscan Cardiolite 01/20/2014: FINDINGS: Pharmacological stress  Baseline EKG shows sinus bradycardia. After injection heart rate increased from 54 beats per min up to 77 beats per min, and blood pressure increased from 118/58 up to 148/62. The test was stopped after injection was complete, the patient did not experience any chest pain. Post-injection EKG showed no specific ischemic changes and no significant arrhythmias.  Perfusion:  There is a moderate-sized mild intensity inferior wall defect seen in the pre injection images. A similar slightly less intense defect is seen in the post-injection images. The inferior wall has normal wall motion, overall findings most consistent with sub- diaphragmatic attenuation. There are no other myocardial perfusion defects.  Wall Motion: Normal left ventricular wall motion. No left ventricular dilation.  Left Ventricular Ejection Fraction: 64 %  End diastolic volume 65 ml  End systolic volume 23 ml  IMPRESSION: 1. No reversible ischemia or infarction.  2. Normal left ventricular wall motion.  3. Left ventricular ejection fraction 64%  4. Low-risk stress test findings*.  Assessment and Plan:  1. Paroxysmal atrial fibrillation, doing well on flecainide and Toprol-XL. He reports no palpitations. Still prefers aspirin to anticoagulation. CHADSVASC score is 3.  2. History of idiopathic pericardial effusion status post pericardial window. He is asymptomatic at this time with small residual effusion by last echocardiogram.  3. Mild hypertension, he continues on Lotensin and Toprol-XL with follow-up per Dr. [name].  Current medicines were reviewed with the patient today.  Disposition: Follow-up in 6 months.  Signed, Satira Sark, MD, [facility] 10/25/2016 11:05 AM    [facility] [place], [place],  [place] [address] Phone: [phone]; Fax: [phone]

## 2016-10-25 ENCOUNTER — Encounter: Payer: Self-pay | Admitting: Cardiology

## 2016-10-25 ENCOUNTER — Ambulatory Visit (INDEPENDENT_AMBULATORY_CARE_PROVIDER_SITE_OTHER): Payer: Medicare Other | Admitting: Cardiology

## 2016-10-25 VITALS — BP 138/70 | HR 60 | Ht 73.0 in | Wt 220.0 lb

## 2016-10-25 DIAGNOSIS — I313 Pericardial effusion (noninflammatory): Secondary | ICD-10-CM | POA: Diagnosis not present

## 2016-10-25 DIAGNOSIS — I10 Essential (primary) hypertension: Secondary | ICD-10-CM

## 2016-10-25 DIAGNOSIS — I480 Paroxysmal atrial fibrillation: Secondary | ICD-10-CM | POA: Diagnosis not present

## 2016-10-25 DIAGNOSIS — I3139 Other pericardial effusion (noninflammatory): Secondary | ICD-10-CM

## 2016-10-25 NOTE — Patient Instructions (Signed)

## 2016-11-14 DIAGNOSIS — L03031 Cellulitis of right toe: Secondary | ICD-10-CM | POA: Diagnosis not present

## 2016-11-14 DIAGNOSIS — M79671 Pain in right foot: Secondary | ICD-10-CM | POA: Diagnosis not present

## 2016-11-14 DIAGNOSIS — M79674 Pain in right toe(s): Secondary | ICD-10-CM | POA: Diagnosis not present

## 2016-11-14 DIAGNOSIS — L600 Ingrowing nail: Secondary | ICD-10-CM | POA: Diagnosis not present

## 2016-11-16 DIAGNOSIS — H401131 Primary open-angle glaucoma, bilateral, mild stage: Secondary | ICD-10-CM | POA: Diagnosis not present

## 2016-11-16 DIAGNOSIS — Z961 Presence of intraocular lens: Secondary | ICD-10-CM | POA: Diagnosis not present

## 2016-11-16 DIAGNOSIS — H524 Presbyopia: Secondary | ICD-10-CM | POA: Diagnosis not present

## 2016-11-20 DIAGNOSIS — Z6831 Body mass index (BMI) 31.0-31.9, adult: Secondary | ICD-10-CM | POA: Diagnosis not present

## 2016-11-20 DIAGNOSIS — J0101 Acute recurrent maxillary sinusitis: Secondary | ICD-10-CM | POA: Diagnosis not present

## 2016-12-01 ENCOUNTER — Other Ambulatory Visit: Payer: Self-pay | Admitting: Cardiology

## 2016-12-11 DIAGNOSIS — C61 Malignant neoplasm of prostate: Secondary | ICD-10-CM | POA: Diagnosis not present

## 2016-12-11 DIAGNOSIS — Z9189 Other specified personal risk factors, not elsewhere classified: Secondary | ICD-10-CM | POA: Diagnosis not present

## 2016-12-11 DIAGNOSIS — I480 Paroxysmal atrial fibrillation: Secondary | ICD-10-CM | POA: Diagnosis not present

## 2016-12-11 DIAGNOSIS — E782 Mixed hyperlipidemia: Secondary | ICD-10-CM | POA: Diagnosis not present

## 2016-12-11 DIAGNOSIS — E1165 Type 2 diabetes mellitus with hyperglycemia: Secondary | ICD-10-CM | POA: Diagnosis not present

## 2016-12-11 DIAGNOSIS — G2581 Restless legs syndrome: Secondary | ICD-10-CM | POA: Diagnosis not present

## 2016-12-11 DIAGNOSIS — E1122 Type 2 diabetes mellitus with diabetic chronic kidney disease: Secondary | ICD-10-CM | POA: Diagnosis not present

## 2016-12-11 DIAGNOSIS — N182 Chronic kidney disease, stage 2 (mild): Secondary | ICD-10-CM | POA: Diagnosis not present

## 2016-12-15 DIAGNOSIS — I10 Essential (primary) hypertension: Secondary | ICD-10-CM | POA: Diagnosis not present

## 2016-12-15 DIAGNOSIS — M1000 Idiopathic gout, unspecified site: Secondary | ICD-10-CM | POA: Diagnosis not present

## 2016-12-15 DIAGNOSIS — D692 Other nonthrombocytopenic purpura: Secondary | ICD-10-CM | POA: Diagnosis not present

## 2016-12-15 DIAGNOSIS — E782 Mixed hyperlipidemia: Secondary | ICD-10-CM | POA: Diagnosis not present

## 2016-12-15 DIAGNOSIS — Z23 Encounter for immunization: Secondary | ICD-10-CM | POA: Diagnosis not present

## 2016-12-15 DIAGNOSIS — Z6830 Body mass index (BMI) 30.0-30.9, adult: Secondary | ICD-10-CM | POA: Diagnosis not present

## 2016-12-15 DIAGNOSIS — G2581 Restless legs syndrome: Secondary | ICD-10-CM | POA: Diagnosis not present

## 2016-12-15 DIAGNOSIS — E1122 Type 2 diabetes mellitus with diabetic chronic kidney disease: Secondary | ICD-10-CM | POA: Diagnosis not present

## 2017-01-29 DIAGNOSIS — M79671 Pain in right foot: Secondary | ICD-10-CM | POA: Diagnosis not present

## 2017-01-29 DIAGNOSIS — L600 Ingrowing nail: Secondary | ICD-10-CM | POA: Diagnosis not present

## 2017-01-29 DIAGNOSIS — L03031 Cellulitis of right toe: Secondary | ICD-10-CM | POA: Diagnosis not present

## 2017-01-29 DIAGNOSIS — M79674 Pain in right toe(s): Secondary | ICD-10-CM | POA: Diagnosis not present

## 2017-02-12 DIAGNOSIS — M79672 Pain in left foot: Secondary | ICD-10-CM | POA: Diagnosis not present

## 2017-02-12 DIAGNOSIS — L600 Ingrowing nail: Secondary | ICD-10-CM | POA: Diagnosis not present

## 2017-02-12 DIAGNOSIS — L03032 Cellulitis of left toe: Secondary | ICD-10-CM | POA: Diagnosis not present

## 2017-02-12 DIAGNOSIS — M79675 Pain in left toe(s): Secondary | ICD-10-CM | POA: Diagnosis not present

## 2017-03-06 DIAGNOSIS — L600 Ingrowing nail: Secondary | ICD-10-CM | POA: Diagnosis not present

## 2017-03-06 DIAGNOSIS — L03031 Cellulitis of right toe: Secondary | ICD-10-CM | POA: Diagnosis not present

## 2017-03-06 DIAGNOSIS — M79671 Pain in right foot: Secondary | ICD-10-CM | POA: Diagnosis not present

## 2017-03-06 DIAGNOSIS — M79674 Pain in right toe(s): Secondary | ICD-10-CM | POA: Diagnosis not present

## 2017-03-14 DIAGNOSIS — H401131 Primary open-angle glaucoma, bilateral, mild stage: Secondary | ICD-10-CM | POA: Diagnosis not present

## 2017-03-29 DIAGNOSIS — L03032 Cellulitis of left toe: Secondary | ICD-10-CM | POA: Diagnosis not present

## 2017-03-29 DIAGNOSIS — M79675 Pain in left toe(s): Secondary | ICD-10-CM | POA: Diagnosis not present

## 2017-03-29 DIAGNOSIS — M79672 Pain in left foot: Secondary | ICD-10-CM | POA: Diagnosis not present

## 2017-04-02 ENCOUNTER — Other Ambulatory Visit: Payer: Self-pay | Admitting: Cardiology

## 2017-04-06 ENCOUNTER — Other Ambulatory Visit: Payer: Self-pay | Admitting: Cardiology

## 2017-04-12 DIAGNOSIS — E782 Mixed hyperlipidemia: Secondary | ICD-10-CM | POA: Diagnosis not present

## 2017-04-12 DIAGNOSIS — C61 Malignant neoplasm of prostate: Secondary | ICD-10-CM | POA: Diagnosis not present

## 2017-04-12 DIAGNOSIS — M79672 Pain in left foot: Secondary | ICD-10-CM | POA: Diagnosis not present

## 2017-04-12 DIAGNOSIS — I10 Essential (primary) hypertension: Secondary | ICD-10-CM | POA: Diagnosis not present

## 2017-04-12 DIAGNOSIS — L03032 Cellulitis of left toe: Secondary | ICD-10-CM | POA: Diagnosis not present

## 2017-04-12 DIAGNOSIS — N182 Chronic kidney disease, stage 2 (mild): Secondary | ICD-10-CM | POA: Diagnosis not present

## 2017-04-12 DIAGNOSIS — E1122 Type 2 diabetes mellitus with diabetic chronic kidney disease: Secondary | ICD-10-CM | POA: Diagnosis not present

## 2017-04-12 DIAGNOSIS — E1165 Type 2 diabetes mellitus with hyperglycemia: Secondary | ICD-10-CM | POA: Diagnosis not present

## 2017-04-12 DIAGNOSIS — Z9189 Other specified personal risk factors, not elsewhere classified: Secondary | ICD-10-CM | POA: Diagnosis not present

## 2017-04-16 DIAGNOSIS — E1122 Type 2 diabetes mellitus with diabetic chronic kidney disease: Secondary | ICD-10-CM | POA: Diagnosis not present

## 2017-04-16 DIAGNOSIS — E782 Mixed hyperlipidemia: Secondary | ICD-10-CM | POA: Diagnosis not present

## 2017-04-16 DIAGNOSIS — M1000 Idiopathic gout, unspecified site: Secondary | ICD-10-CM | POA: Diagnosis not present

## 2017-04-16 DIAGNOSIS — G2581 Restless legs syndrome: Secondary | ICD-10-CM | POA: Diagnosis not present

## 2017-04-16 DIAGNOSIS — Z0001 Encounter for general adult medical examination with abnormal findings: Secondary | ICD-10-CM | POA: Diagnosis not present

## 2017-04-16 DIAGNOSIS — Z23 Encounter for immunization: Secondary | ICD-10-CM | POA: Diagnosis not present

## 2017-04-16 DIAGNOSIS — J301 Allergic rhinitis due to pollen: Secondary | ICD-10-CM | POA: Diagnosis not present

## 2017-04-16 DIAGNOSIS — D692 Other nonthrombocytopenic purpura: Secondary | ICD-10-CM | POA: Diagnosis not present

## 2017-04-16 DIAGNOSIS — Z1212 Encounter for screening for malignant neoplasm of rectum: Secondary | ICD-10-CM | POA: Diagnosis not present

## 2017-04-16 DIAGNOSIS — I10 Essential (primary) hypertension: Secondary | ICD-10-CM | POA: Diagnosis not present

## 2017-04-16 NOTE — Progress Notes (Signed)
Cardiology Office Note  Date: 04/17/2017   ID: [name] [name], DOB Aug 19, 1940, MRN [id number]  PCP: Caryl Bis, MD  Primary Cardiologist: Rozann Lesches, MD   Chief Complaint  Patient presents with  . PAF    History of Present Illness: [name] [name] is a 77 y.o. male last seen in August 2018. He presents for a routine follow-up visit. Since last assessment he does not report any palpitations or chest pain. States he has had some trouble with an infected toenail on his left foot that limited his regular walking regimen, but he is getting back to it now.  CHADSVASC score is 3, he haspreferred aspirin to anticoagulation.  We have discussed this again. I personally reviewed his ECG today which shows sinus bradycardia, QRS duration 102 ms and normal QT interval. He is tolerating flecainide and Toprol-XL with good rhythm control.  Last echocardiogram was in 2016 at which point there was a small residual pericardial effusion. He reports no increased shortness of breath, lightheadedness, or syncope.  Past Medical History:  Diagnosis Date  . Hypertension   . Leg fracture    Distal tibial/fibula 2003  . Paroxysmal atrial fibrillation (HCC)   . Pericardial effusion    Idiopathic, recurrent pericardial effusion s/p pericardial window.  . Prostate cancer ([place])    Seed implants 2007  . Supraventricular tachycardia ([place])   . Temporal arteritis [facility])     Past Surgical History:  Procedure Laterality Date  . CATARACT EXTRACTION W/PHACO Left 06/07/2015   Procedure: CATARACT EXTRACTION PHACO AND INTRAOCULAR LENS PLACEMENT LEFT EYE CDE=8.00;  Surgeon: Tonny Branch, MD;  Location: [facility];  Service: Ophthalmology;  Laterality: Left;  . CATARACT EXTRACTION W/PHACO Right 06/17/2015   Procedure: CATARACT EXTRACTION PHACO AND INTRAOCULAR LENS PLACEMENT RIGHT EYE CDE=11.09;  Surgeon: Tonny Branch, MD;  Location: [facility];  Service: Ophthalmology;  Laterality: Right;  . PERICARDIAL WINDOW  December 2008     Current Outpatient Medications  Medication Sig Dispense Refill  . acetaminophen (TYLENOL) 500 MG tablet Take 1,000 mg by mouth every 6 (six) hours as needed for moderate pain.    Marland Kitchen allopurinol (ZYLOPRIM) 300 MG tablet Take 450 mg by mouth daily.     Marland Kitchen aspirin 81 MG tablet Take 81 mg by mouth daily.    . benazepril (LOTENSIN) 20 MG tablet Take 20 mg by mouth daily.    . chlorthalidone (HYGROTON) 25 MG tablet TAKE ONE TABLET BY MOUTH ONCE DAILY 30 tablet 3  . flecainide (TAMBOCOR) 50 MG tablet TAKE ONE TABLET BY MOUTH TWICE DAILY 180 tablet 3  . metoprolol succinate (TOPROL-XL) 50 MG 24 hr tablet TAKE 1 TABLET BY MOUTH ONCE DAILY WITH FOOD OR  IMMEDIATELY  FOLLOWING  A  MEAL 90 tablet 1  . Multiple Vitamin (MULTIVITAMIN) tablet Take 1 tablet by mouth daily.    . pantoprazole (PROTONIX) 40 MG tablet Take 40 mg by mouth every other day.     No current facility-administered medications for this visit.    Allergies:  Amiodarone   Social History: The patient  reports that he quit smoking about 57 years ago. His smoking use included cigarettes. He started smoking about 58 years ago. He has a 0.90 pack-year smoking history. he has never used smokeless tobacco. He reports that he does not drink alcohol or use drugs.  ROS:  Please see the history of present illness. Otherwise, complete review of systems is positive for URI over the holidays.  All other systems are  reviewed and negative.   Physical Exam: VS:  BP 130/70   Pulse (!) 58   Ht 6\' 1"  (1.854 m)   Wt 230 lb 12.8 oz (104.7 kg)   SpO2 98%   BMI 30.45 kg/m , BMI Body mass index is 30.45 kg/m.  Wt Readings from Last 3 Encounters:  04/17/17 230 lb 12.8 oz (104.7 kg)  10/25/16 220 lb (99.8 kg)  04/27/16 224 lb (101.6 kg)    General: Elderly male, appears comfortable at rest. HEENT: Conjunctiva and lids normal, oropharynx clear Neck: Supple, no elevated JVP or carotid bruits, no thyromegaly. Lungs: Clear to auscultation, nonlabored  breathing at rest. Cardiac: Regular rate and rhythm, no S3, 2/6 systolic murmur, no pericardial rub. Abdomen: Soft, nontender, bowel sounds present. Extremities: Trace ankle edema, distal pulses 2+. Skin: Warm and dry. Musculoskeletal: No kyphosis. Neuropsychiatric: Alert and oriented x3, affect grossly appropriate.  ECG: I personally reviewed the tracing from 04/27/2016 which showed sinus bradycardia with low voltage, R' in lead V1 and V2.  Recent Labwork:  January 2018: Cholesterol 162, triglycerides 71, HDL 40, LDL 108, BUN 17, creatinine 0.9, potassium 4.6, AST 23, ALT 20, hemoglobin 13.6, platelets 204, hemoglobin A1c 6.2  Other Studies Reviewed Today:  Echocardiogram 11/04/2014: Study Conclusions  - Left ventricle: The cavity size was normal. Wall thickness was increased in a pattern of severe LVH. Systolic function was vigorous. The estimated ejection fraction was in the range of 65% to 70%. Wall motion was normal; there were no regional wall motion abnormalities. Doppler parameters are consistent with abnormal left ventricular relaxation (grade 1 diastolic dysfunction). - Aortic valve: Mildly calcified annulus. Trileaflet; mildly thickened leaflets. Valve area (VTI): 3.57 cm^2. Valve area (Vmax): 3.7 cm^2. Valve area (Vmean): 3.3 cm^2. - Mitral valve: Mildly calcified annulus. Normal thickness leaflets. - Left atrium: The atrium was severely dilated. - Right atrium: The atrium was mildly dilated. - Pericardium: There is a small circumferential pericardial effusion.  Lexiscan Cardiolite 01/20/2014: FINDINGS: Pharmacological stress  Baseline EKG shows sinus bradycardia. After injection heart rate increased from 54 beats per min up to 77 beats per min, and blood pressure increased from 118/58 up to 148/62. The test was stopped after injection was complete, the patient did not experience any chest pain. Post-injection EKG showed no specific ischemic  changes and no significant arrhythmias.  Perfusion: There is a moderate-sized mild intensity inferior wall defect seen in the pre injection images. A similar slightly less intense defect is seen in the post-injection images. The inferior wall has normal wall motion, overall findings most consistent with sub- diaphragmatic attenuation. There are no other myocardial perfusion defects.  Wall Motion: Normal left ventricular wall motion. No left ventricular dilation.  Left Ventricular Ejection Fraction: 64 %  End diastolic volume 65 ml  End systolic volume 23 ml  IMPRESSION: 1. No reversible ischemia or infarction.  2. Normal left ventricular wall motion.  3. Left ventricular ejection fraction 64%  4. Low-risk stress test findings*.  Assessment and Plan:  1. Paroxysmal atrial fibrillation, symptomatically well controlled on flecainide and Toprol-XL. He is in sinus rhythm today by ECG with stable intervals.CHADSVASC score is 3, he prefers to hold off anticoagulation and continues on aspirin.  2. History of idiopathic pericardial effusion status post pericardial window. He has had no obvious symptoms in the interim and last assessment showed small stable residual pericardial effusion in 2016.  3. Essential hypertension, no changes made present regimen. Keep follow-up with Dr. [name].  Current medicines were reviewed  with the patient today.   Orders Placed This Encounter  Procedures  . EKG 12-Lead    Disposition: Follow-up in 6 months.  Signed, Satira Sark, MD, [facility] 04/17/2017 8:53 AM    [facility] at [place], [place], [place] [address] Phone: [phone]; Fax: [phone]

## 2017-04-17 ENCOUNTER — Encounter: Payer: Self-pay | Admitting: Cardiology

## 2017-04-17 ENCOUNTER — Ambulatory Visit: Payer: Medicare Other | Admitting: Cardiology

## 2017-04-17 VITALS — BP 130/70 | HR 58 | Ht 73.0 in | Wt 230.8 lb

## 2017-04-17 DIAGNOSIS — I3139 Other pericardial effusion (noninflammatory): Secondary | ICD-10-CM

## 2017-04-17 DIAGNOSIS — I480 Paroxysmal atrial fibrillation: Secondary | ICD-10-CM

## 2017-04-17 DIAGNOSIS — I10 Essential (primary) hypertension: Secondary | ICD-10-CM | POA: Diagnosis not present

## 2017-04-17 DIAGNOSIS — I313 Pericardial effusion (noninflammatory): Secondary | ICD-10-CM

## 2017-04-17 NOTE — Patient Instructions (Signed)

## 2017-05-23 DIAGNOSIS — M79671 Pain in right foot: Secondary | ICD-10-CM | POA: Diagnosis not present

## 2017-05-23 DIAGNOSIS — L600 Ingrowing nail: Secondary | ICD-10-CM | POA: Diagnosis not present

## 2017-05-23 DIAGNOSIS — M79674 Pain in right toe(s): Secondary | ICD-10-CM | POA: Diagnosis not present

## 2017-05-23 DIAGNOSIS — L03031 Cellulitis of right toe: Secondary | ICD-10-CM | POA: Diagnosis not present

## 2017-06-14 DIAGNOSIS — H9191 Unspecified hearing loss, right ear: Secondary | ICD-10-CM | POA: Diagnosis not present

## 2017-06-14 DIAGNOSIS — Z6832 Body mass index (BMI) 32.0-32.9, adult: Secondary | ICD-10-CM | POA: Diagnosis not present

## 2017-08-13 DIAGNOSIS — C61 Malignant neoplasm of prostate: Secondary | ICD-10-CM | POA: Diagnosis not present

## 2017-08-13 DIAGNOSIS — E1165 Type 2 diabetes mellitus with hyperglycemia: Secondary | ICD-10-CM | POA: Diagnosis not present

## 2017-08-13 DIAGNOSIS — Z9189 Other specified personal risk factors, not elsewhere classified: Secondary | ICD-10-CM | POA: Diagnosis not present

## 2017-08-13 DIAGNOSIS — I10 Essential (primary) hypertension: Secondary | ICD-10-CM | POA: Diagnosis not present

## 2017-08-13 DIAGNOSIS — E782 Mixed hyperlipidemia: Secondary | ICD-10-CM | POA: Diagnosis not present

## 2017-08-13 DIAGNOSIS — M1000 Idiopathic gout, unspecified site: Secondary | ICD-10-CM | POA: Diagnosis not present

## 2017-08-13 DIAGNOSIS — N182 Chronic kidney disease, stage 2 (mild): Secondary | ICD-10-CM | POA: Diagnosis not present

## 2017-08-13 DIAGNOSIS — E1122 Type 2 diabetes mellitus with diabetic chronic kidney disease: Secondary | ICD-10-CM | POA: Diagnosis not present

## 2017-08-13 DIAGNOSIS — I480 Paroxysmal atrial fibrillation: Secondary | ICD-10-CM | POA: Diagnosis not present

## 2017-08-16 DIAGNOSIS — G2581 Restless legs syndrome: Secondary | ICD-10-CM | POA: Diagnosis not present

## 2017-08-16 DIAGNOSIS — Z6831 Body mass index (BMI) 31.0-31.9, adult: Secondary | ICD-10-CM | POA: Diagnosis not present

## 2017-08-16 DIAGNOSIS — I10 Essential (primary) hypertension: Secondary | ICD-10-CM | POA: Diagnosis not present

## 2017-08-16 DIAGNOSIS — E782 Mixed hyperlipidemia: Secondary | ICD-10-CM | POA: Diagnosis not present

## 2017-08-16 DIAGNOSIS — J301 Allergic rhinitis due to pollen: Secondary | ICD-10-CM | POA: Diagnosis not present

## 2017-08-16 DIAGNOSIS — D692 Other nonthrombocytopenic purpura: Secondary | ICD-10-CM | POA: Diagnosis not present

## 2017-08-16 DIAGNOSIS — M1000 Idiopathic gout, unspecified site: Secondary | ICD-10-CM | POA: Diagnosis not present

## 2017-08-16 DIAGNOSIS — E1122 Type 2 diabetes mellitus with diabetic chronic kidney disease: Secondary | ICD-10-CM | POA: Diagnosis not present

## 2017-08-23 DIAGNOSIS — R69 Illness, unspecified: Secondary | ICD-10-CM | POA: Diagnosis not present

## 2017-08-31 DIAGNOSIS — R42 Dizziness and giddiness: Secondary | ICD-10-CM | POA: Diagnosis not present

## 2017-08-31 DIAGNOSIS — H9191 Unspecified hearing loss, right ear: Secondary | ICD-10-CM | POA: Diagnosis not present

## 2017-09-24 DIAGNOSIS — D649 Anemia, unspecified: Secondary | ICD-10-CM | POA: Diagnosis not present

## 2017-09-24 DIAGNOSIS — R110 Nausea: Secondary | ICD-10-CM | POA: Diagnosis not present

## 2017-09-24 DIAGNOSIS — D529 Folate deficiency anemia, unspecified: Secondary | ICD-10-CM | POA: Diagnosis not present

## 2017-09-24 DIAGNOSIS — Z6832 Body mass index (BMI) 32.0-32.9, adult: Secondary | ICD-10-CM | POA: Diagnosis not present

## 2017-09-24 DIAGNOSIS — R05 Cough: Secondary | ICD-10-CM | POA: Diagnosis not present

## 2017-09-24 DIAGNOSIS — D519 Vitamin B12 deficiency anemia, unspecified: Secondary | ICD-10-CM | POA: Diagnosis not present

## 2017-09-25 DIAGNOSIS — R69 Illness, unspecified: Secondary | ICD-10-CM | POA: Diagnosis not present

## 2017-09-26 DIAGNOSIS — R110 Nausea: Secondary | ICD-10-CM | POA: Diagnosis not present

## 2017-09-26 DIAGNOSIS — R109 Unspecified abdominal pain: Secondary | ICD-10-CM | POA: Diagnosis not present

## 2017-09-26 DIAGNOSIS — R197 Diarrhea, unspecified: Secondary | ICD-10-CM | POA: Diagnosis not present

## 2017-09-26 DIAGNOSIS — R509 Fever, unspecified: Secondary | ICD-10-CM | POA: Diagnosis not present

## 2017-10-01 DIAGNOSIS — Z6831 Body mass index (BMI) 31.0-31.9, adult: Secondary | ICD-10-CM | POA: Diagnosis not present

## 2017-10-01 DIAGNOSIS — K279 Peptic ulcer, site unspecified, unspecified as acute or chronic, without hemorrhage or perforation: Secondary | ICD-10-CM | POA: Diagnosis not present

## 2017-10-01 DIAGNOSIS — D500 Iron deficiency anemia secondary to blood loss (chronic): Secondary | ICD-10-CM | POA: Diagnosis not present

## 2017-10-18 ENCOUNTER — Other Ambulatory Visit: Payer: Self-pay | Admitting: Cardiology

## 2017-10-23 DIAGNOSIS — H35039 Hypertensive retinopathy, unspecified eye: Secondary | ICD-10-CM | POA: Diagnosis not present

## 2017-10-23 DIAGNOSIS — H524 Presbyopia: Secondary | ICD-10-CM | POA: Diagnosis not present

## 2017-10-23 DIAGNOSIS — E1165 Type 2 diabetes mellitus with hyperglycemia: Secondary | ICD-10-CM | POA: Diagnosis not present

## 2017-10-23 DIAGNOSIS — I10 Essential (primary) hypertension: Secondary | ICD-10-CM | POA: Diagnosis not present

## 2017-10-23 DIAGNOSIS — K219 Gastro-esophageal reflux disease without esophagitis: Secondary | ICD-10-CM | POA: Diagnosis not present

## 2017-10-23 DIAGNOSIS — I482 Chronic atrial fibrillation: Secondary | ICD-10-CM | POA: Diagnosis not present

## 2017-10-23 NOTE — Progress Notes (Signed)
Cardiology Office Note  Date: 10/24/2017   ID: [name] [name], DOB 1941-02-13, MRN [id number]  PCP: Caryl Bis, MD  Primary Cardiologist: Rozann Lesches, MD   Chief Complaint  Patient presents with  . Atrial Fibrillation    History of Present Illness: [name] [name] is a 77 y.o. male last seen in January.  He is here for a routine visit.  States that he has had recent trouble with a "stomach infection," completed a course of antibiotics per PCP.  He was also placed on low-dose amlodipine for blood pressure control, has had some recent leg swelling which could be a side effect.  His last echocardiogram was in 2016 at which point LVEF was 65 to 70% with grade 1 diastolic dysfunction and a small pericardial effusion.  CHADSVASC score is 3.  He has declined anticoagulation, continues on aspirin daily.  He does not report any palpitations and has had generally good rhythm control on combination of flecainide and Toprol-XL.  He does not report any exertional chest pain.  Past Medical History:  Diagnosis Date  . Hypertension   . Leg fracture    Distal tibial/fibula 2003  . Paroxysmal atrial fibrillation (HCC)   . Pericardial effusion    Idiopathic, recurrent pericardial effusion s/p pericardial window.  . Prostate cancer ([place])    Seed implants 2007  . Supraventricular tachycardia ([place])   . Temporal arteritis [facility])     Past Surgical History:  Procedure Laterality Date  . CATARACT EXTRACTION W/PHACO Left 06/07/2015   Procedure: CATARACT EXTRACTION PHACO AND INTRAOCULAR LENS PLACEMENT LEFT EYE CDE=8.00;  Surgeon: Tonny Branch, MD;  Location: [facility];  Service: Ophthalmology;  Laterality: Left;  . CATARACT EXTRACTION W/PHACO Right 06/17/2015   Procedure: CATARACT EXTRACTION PHACO AND INTRAOCULAR LENS PLACEMENT RIGHT EYE CDE=11.09;  Surgeon: Tonny Branch, MD;  Location: [facility];  Service: Ophthalmology;  Laterality: Right;  . PERICARDIAL WINDOW  December 2008    Current Outpatient  Medications  Medication Sig Dispense Refill  . acetaminophen (TYLENOL) 500 MG tablet Take 1,000 mg by mouth every 6 (six) hours as needed for moderate pain.    Marland Kitchen allopurinol (ZYLOPRIM) 300 MG tablet Take 450 mg by mouth daily.     Marland Kitchen amLODipine (NORVASC) 2.5 MG tablet Take 2.5 mg by mouth daily.    Marland Kitchen aspirin 81 MG tablet Take 81 mg by mouth daily.    . benazepril (LOTENSIN) 20 MG tablet Take 20 mg by mouth daily.    . ferrous sulfate 325 (65 FE) MG tablet Take 325 mg by mouth 2 (two) times daily.    . flecainide (TAMBOCOR) 50 MG tablet TAKE ONE TABLET BY MOUTH TWICE DAILY 180 tablet 3  . metoprolol succinate (TOPROL-XL) 50 MG 24 hr tablet TAKE 1 TABLET BY MOUTH ONCE DAILY WITH FOOD OR  IMMEDIATELY  FOLLOWING  A  MEAL 90 tablet 0  . Multiple Vitamin (MULTIVITAMIN) tablet Take 1 tablet by mouth daily.    . pantoprazole (PROTONIX) 40 MG tablet Take 40 mg by mouth daily.     . chlorthalidone (HYGROTON) 25 MG tablet TAKE ONE TABLET BY MOUTH ONCE DAILY 30 tablet 3   No current facility-administered medications for this visit.    Allergies:  Amiodarone   Social History: The patient  reports that he quit smoking about 57 years ago. His smoking use included cigarettes. He started smoking about 59 years ago. He has a 0.90 pack-year smoking history. He has never used smokeless tobacco. He reports  that he does not drink alcohol or use drugs.   ROS:  Please see the history of present illness. Otherwise, complete review of systems is positive for hearing loss.  All other systems are reviewed and negative.   Physical Exam: VS:  BP (!) 154/71   Pulse 71   Ht 6' (1.829 m)   Wt 227 lb 6.4 oz (103.1 kg)   SpO2 96%   BMI 30.84 kg/m , BMI Body mass index is 30.84 kg/m.  Wt Readings from Last 3 Encounters:  10/24/17 227 lb 6.4 oz (103.1 kg)  04/17/17 230 lb 12.8 oz (104.7 kg)  10/25/16 220 lb (99.8 kg)    General: Elderly male, appears comfortable at rest. HEENT: Conjunctiva and lids normal,  oropharynx clear. Neck: Supple, no elevated JVP or carotid bruits, no thyromegaly. Lungs: Clear to auscultation, nonlabored breathing at rest. Cardiac: Regular rate and rhythm, no S3, 2/6 systolic murmur, no pericardial rub. Abdomen: Soft, nontender, bowel sounds present. Extremities: 1-2+ ankle edema, distal pulses 2+. Skin: Warm and dry. Musculoskeletal: No kyphosis. Neuropsychiatric: Alert and oriented x3, affect grossly appropriate.  ECG: I personally reviewed the tracing from 04/17/2017 which showed sinus bradycardia, QRS duration 102 ms, normal QT interval.  Recent Labwork:  January 2018: Cholesterol 162, triglycerides 71, HDL 40, LDL 108, BUN 17, creatinine 0.9, potassium 4.6, AST 23, ALT 20, hemoglobin 13.6, platelets204, hemoglobin A1c 6.2  Other Studies Reviewed Today:  Echocardiogram 11/04/2014: Study Conclusions  - Left ventricle: The cavity size was normal. Wall thickness was increased in a pattern of severe LVH. Systolic function was vigorous. The estimated ejection fraction was in the range of 65% to 70%. Wall motion was normal; there were no regional wall motion abnormalities. Doppler parameters are consistent with abnormal left ventricular relaxation (grade 1 diastolic dysfunction). - Aortic valve: Mildly calcified annulus. Trileaflet; mildly thickened leaflets. Valve area (VTI): 3.57 cm^2. Valve area (Vmax): 3.7 cm^2. Valve area (Vmean): 3.3 cm^2. - Mitral valve: Mildly calcified annulus. Normal thickness leaflets. - Left atrium: The atrium was severely dilated. - Right atrium: The atrium was mildly dilated. - Pericardium: There is a small circumferential pericardial effusion.  Lexiscan Cardiolite 01/20/2014: FINDINGS: Pharmacological stress  Baseline EKG shows sinus bradycardia. After injection heart rate increased from 54 beats per min up to 77 beats per min, and blood pressure increased from 118/58 up to 148/62. The test was  stopped after injection was complete, the patient did not experience any chest pain. Post-injection EKG showed no specific ischemic changes and no significant arrhythmias.  Perfusion: There is a moderate-sized mild intensity inferior wall defect seen in the pre injection images. A similar slightly less intense defect is seen in the post-injection images. The inferior wall has normal wall motion, overall findings most consistent with sub- diaphragmatic attenuation. There are no other myocardial perfusion defects.  Wall Motion: Normal left ventricular wall motion. No left ventricular dilation.  Left Ventricular Ejection Fraction: 64 %  End diastolic volume 65 ml  End systolic volume 23 ml  IMPRESSION: 1. No reversible ischemia or infarction.  2. Normal left ventricular wall motion.  3. Left ventricular ejection fraction 64%  4. Low-risk stress test findings*.  Assessment and Plan:  1.  Paroxysmal atrial fibrillation with CHADSVASC score of 3.  He declines anticoagulation, continues on aspirin.  Rhythm control has been good on combination of flecainide and Toprol-XL which we will continue for now.  2.  Leg swelling, possibly side effect of Norvasc.  He has not had follow-up assessment  of LVEF since 2016, echocardiogram will be arranged.  3.  Hypertension, now on low-dose Norvasc, Lotensin, and Toprol-XL with follow-up per Dr. [name].  4.  History of idiopathic pericardial effusion status post pericardial window.  This will also be reassessed by follow-up echocardiogram, small residual collection noted as of 2016.  Current medicines were reviewed with the patient today.   Orders Placed This Encounter  Procedures  . ECHOCARDIOGRAM COMPLETE    Disposition: Follow-up in 6 months.  Signed, Satira Sark, MD, [facility] 10/24/2017 9:49 AM    [facility] at [place], [place], [place] [address] Phone: [phone]; Fax: [phone]

## 2017-10-24 ENCOUNTER — Encounter: Payer: Self-pay | Admitting: Cardiology

## 2017-10-24 ENCOUNTER — Ambulatory Visit: Payer: Medicare HMO | Admitting: Cardiology

## 2017-10-24 VITALS — BP 154/71 | HR 71 | Ht 72.0 in | Wt 227.4 lb

## 2017-10-24 DIAGNOSIS — I3139 Other pericardial effusion (noninflammatory): Secondary | ICD-10-CM

## 2017-10-24 DIAGNOSIS — R600 Localized edema: Secondary | ICD-10-CM

## 2017-10-24 DIAGNOSIS — I313 Pericardial effusion (noninflammatory): Secondary | ICD-10-CM | POA: Diagnosis not present

## 2017-10-24 DIAGNOSIS — I10 Essential (primary) hypertension: Secondary | ICD-10-CM | POA: Diagnosis not present

## 2017-10-24 DIAGNOSIS — I480 Paroxysmal atrial fibrillation: Secondary | ICD-10-CM | POA: Diagnosis not present

## 2017-10-24 NOTE — Patient Instructions (Signed)

## 2017-10-30 DIAGNOSIS — L570 Actinic keratosis: Secondary | ICD-10-CM | POA: Diagnosis not present

## 2017-10-30 DIAGNOSIS — L820 Inflamed seborrheic keratosis: Secondary | ICD-10-CM | POA: Diagnosis not present

## 2017-10-31 ENCOUNTER — Other Ambulatory Visit: Payer: Self-pay

## 2017-10-31 ENCOUNTER — Ambulatory Visit (INDEPENDENT_AMBULATORY_CARE_PROVIDER_SITE_OTHER): Payer: Medicare HMO

## 2017-10-31 DIAGNOSIS — I480 Paroxysmal atrial fibrillation: Secondary | ICD-10-CM | POA: Diagnosis not present

## 2017-11-01 ENCOUNTER — Telehealth: Payer: Self-pay

## 2017-11-01 NOTE — Telephone Encounter (Signed)
-----   Message from Erma Heritage, [place] sent at 10/31/2017  7:58 PM EDT ----- Covering for Dr. Domenic Polite - Please let the patient know that his echocardiogram showed normal pumping function of the heart with mild thickness of the heart muscle which is similar to prior imaging in 2016 and likely due to his history of HTN. He does have trivial leakage along his aortic valve.  Also noted to have a small pericardial effusion which was again noted in 2016 and appears unchanged when compared to prior imaging. Please forward a copy to Caryl Bis, MD.

## 2017-11-01 NOTE — Telephone Encounter (Signed)
Patient notified. Routed to PCP 

## 2017-11-14 DIAGNOSIS — D519 Vitamin B12 deficiency anemia, unspecified: Secondary | ICD-10-CM | POA: Diagnosis not present

## 2017-11-14 DIAGNOSIS — D529 Folate deficiency anemia, unspecified: Secondary | ICD-10-CM | POA: Diagnosis not present

## 2017-11-14 DIAGNOSIS — D649 Anemia, unspecified: Secondary | ICD-10-CM | POA: Diagnosis not present

## 2017-12-03 DIAGNOSIS — Z8546 Personal history of malignant neoplasm of prostate: Secondary | ICD-10-CM | POA: Diagnosis not present

## 2017-12-03 DIAGNOSIS — C61 Malignant neoplasm of prostate: Secondary | ICD-10-CM | POA: Diagnosis not present

## 2017-12-12 ENCOUNTER — Other Ambulatory Visit: Payer: Self-pay | Admitting: Cardiology

## 2017-12-13 DIAGNOSIS — I10 Essential (primary) hypertension: Secondary | ICD-10-CM | POA: Diagnosis not present

## 2017-12-13 DIAGNOSIS — N182 Chronic kidney disease, stage 2 (mild): Secondary | ICD-10-CM | POA: Diagnosis not present

## 2017-12-13 DIAGNOSIS — E1165 Type 2 diabetes mellitus with hyperglycemia: Secondary | ICD-10-CM | POA: Diagnosis not present

## 2017-12-13 DIAGNOSIS — D649 Anemia, unspecified: Secondary | ICD-10-CM | POA: Diagnosis not present

## 2017-12-13 DIAGNOSIS — I482 Chronic atrial fibrillation: Secondary | ICD-10-CM | POA: Diagnosis not present

## 2017-12-13 DIAGNOSIS — E782 Mixed hyperlipidemia: Secondary | ICD-10-CM | POA: Diagnosis not present

## 2017-12-13 DIAGNOSIS — K219 Gastro-esophageal reflux disease without esophagitis: Secondary | ICD-10-CM | POA: Diagnosis not present

## 2017-12-17 DIAGNOSIS — G2581 Restless legs syndrome: Secondary | ICD-10-CM | POA: Diagnosis not present

## 2017-12-17 DIAGNOSIS — Z6831 Body mass index (BMI) 31.0-31.9, adult: Secondary | ICD-10-CM | POA: Diagnosis not present

## 2017-12-17 DIAGNOSIS — E1169 Type 2 diabetes mellitus with other specified complication: Secondary | ICD-10-CM | POA: Diagnosis not present

## 2017-12-17 DIAGNOSIS — M1000 Idiopathic gout, unspecified site: Secondary | ICD-10-CM | POA: Diagnosis not present

## 2017-12-17 DIAGNOSIS — I10 Essential (primary) hypertension: Secondary | ICD-10-CM | POA: Diagnosis not present

## 2017-12-17 DIAGNOSIS — E782 Mixed hyperlipidemia: Secondary | ICD-10-CM | POA: Diagnosis not present

## 2017-12-17 DIAGNOSIS — Z23 Encounter for immunization: Secondary | ICD-10-CM | POA: Diagnosis not present

## 2017-12-17 DIAGNOSIS — D692 Other nonthrombocytopenic purpura: Secondary | ICD-10-CM | POA: Diagnosis not present

## 2017-12-20 DIAGNOSIS — H26493 Other secondary cataract, bilateral: Secondary | ICD-10-CM | POA: Diagnosis not present

## 2017-12-28 DIAGNOSIS — Z6831 Body mass index (BMI) 31.0-31.9, adult: Secondary | ICD-10-CM | POA: Diagnosis not present

## 2017-12-28 DIAGNOSIS — I10 Essential (primary) hypertension: Secondary | ICD-10-CM | POA: Diagnosis not present

## 2017-12-31 ENCOUNTER — Inpatient Hospital Stay (HOSPITAL_COMMUNITY)
Admission: EM | Admit: 2017-12-31 | Discharge: 2018-01-03 | DRG: 247 | Disposition: A | Discharge status: Home / Self Care | Payer: Medicare HMO | Attending: Cardiology | Admitting: Cardiology

## 2017-12-31 ENCOUNTER — Encounter (HOSPITAL_COMMUNITY): Payer: Self-pay | Admitting: Emergency Medicine

## 2017-12-31 ENCOUNTER — Telehealth: Payer: Self-pay | Admitting: Cardiology

## 2017-12-31 DIAGNOSIS — I25110 Atherosclerotic heart disease of native coronary artery with unstable angina pectoris: Secondary | ICD-10-CM | POA: Diagnosis present

## 2017-12-31 DIAGNOSIS — E785 Hyperlipidemia, unspecified: Secondary | ICD-10-CM | POA: Diagnosis present

## 2017-12-31 DIAGNOSIS — I10 Essential (primary) hypertension: Secondary | ICD-10-CM | POA: Diagnosis present

## 2017-12-31 DIAGNOSIS — Z8249 Family history of ischemic heart disease and other diseases of the circulatory system: Secondary | ICD-10-CM

## 2017-12-31 DIAGNOSIS — Z801 Family history of malignant neoplasm of trachea, bronchus and lung: Secondary | ICD-10-CM | POA: Diagnosis not present

## 2017-12-31 DIAGNOSIS — I480 Paroxysmal atrial fibrillation: Secondary | ICD-10-CM | POA: Insufficient documentation

## 2017-12-31 DIAGNOSIS — I313 Pericardial effusion (noninflammatory): Secondary | ICD-10-CM

## 2017-12-31 DIAGNOSIS — Z8042 Family history of malignant neoplasm of prostate: Secondary | ICD-10-CM

## 2017-12-31 DIAGNOSIS — E7800 Pure hypercholesterolemia, unspecified: Secondary | ICD-10-CM | POA: Diagnosis not present

## 2017-12-31 DIAGNOSIS — I160 Hypertensive urgency: Secondary | ICD-10-CM | POA: Diagnosis not present

## 2017-12-31 DIAGNOSIS — I214 Non-ST elevation (NSTEMI) myocardial infarction: Principal | ICD-10-CM

## 2017-12-31 DIAGNOSIS — I4510 Unspecified right bundle-branch block: Secondary | ICD-10-CM | POA: Diagnosis present

## 2017-12-31 DIAGNOSIS — Z955 Presence of coronary angioplasty implant and graft: Secondary | ICD-10-CM | POA: Diagnosis not present

## 2017-12-31 DIAGNOSIS — Z8546 Personal history of malignant neoplasm of prostate: Secondary | ICD-10-CM | POA: Diagnosis not present

## 2017-12-31 DIAGNOSIS — I252 Old myocardial infarction: Secondary | ICD-10-CM | POA: Diagnosis present

## 2017-12-31 DIAGNOSIS — I2510 Atherosclerotic heart disease of native coronary artery without angina pectoris: Secondary | ICD-10-CM | POA: Diagnosis not present

## 2017-12-31 DIAGNOSIS — I169 Hypertensive crisis, unspecified: Secondary | ICD-10-CM | POA: Diagnosis present

## 2017-12-31 DIAGNOSIS — Z7982 Long term (current) use of aspirin: Secondary | ICD-10-CM

## 2017-12-31 DIAGNOSIS — I5030 Unspecified diastolic (congestive) heart failure: Secondary | ICD-10-CM | POA: Diagnosis not present

## 2017-12-31 DIAGNOSIS — E782 Mixed hyperlipidemia: Secondary | ICD-10-CM

## 2017-12-31 HISTORY — DX: Headache: R51

## 2017-12-31 HISTORY — DX: Headache, unspecified: R51.9

## 2017-12-31 HISTORY — DX: Personal history of urinary calculi: Z87.442

## 2017-12-31 HISTORY — DX: Non-ST elevation (NSTEMI) myocardial infarction: I21.4

## 2017-12-31 HISTORY — DX: Anemia, unspecified: D64.9

## 2017-12-31 HISTORY — DX: Gout, unspecified: M10.9

## 2017-12-31 HISTORY — DX: Unspecified osteoarthritis, unspecified site: M19.90

## 2017-12-31 LAB — APTT: aPTT: 52 seconds — ABNORMAL HIGH (ref 24–36)

## 2017-12-31 LAB — BRAIN NATRIURETIC PEPTIDE: B Natriuretic Peptide: 210 pg/mL — ABNORMAL HIGH (ref 0.0–100.0)

## 2017-12-31 LAB — PROTIME-INR
INR: 1.07
Prothrombin Time: 13.8 seconds (ref 11.4–15.2)

## 2017-12-31 LAB — CBC WITH DIFFERENTIAL/PLATELET
BASOS PCT: 1 %
Basophils Absolute: 0 10*3/uL (ref 0.0–0.1)
Eosinophils Absolute: 0.1 10*3/uL (ref 0.0–0.7)
Eosinophils Relative: 2 %
HEMATOCRIT: 38.3 % — AB (ref 39.0–52.0)
Hemoglobin: 12.1 g/dL — ABNORMAL LOW (ref 13.0–17.0)
LYMPHS ABS: 1.6 10*3/uL (ref 0.7–4.0)
Lymphocytes Relative: 25 %
MCH: 26.5 pg (ref 26.0–34.0)
MCHC: 31.6 g/dL (ref 30.0–36.0)
MCV: 83.8 fL (ref 78.0–100.0)
MONO ABS: 0.4 10*3/uL (ref 0.1–1.0)
MONOS PCT: 6 %
NEUTROS ABS: 4 10*3/uL (ref 1.7–7.7)
Neutrophils Relative %: 66 %
Platelets: 174 10*3/uL (ref 150–400)
RBC: 4.57 MIL/uL (ref 4.22–5.81)
RDW: 18.5 % — AB (ref 11.5–15.5)
WBC: 6.1 10*3/uL (ref 4.0–10.5)

## 2017-12-31 LAB — BASIC METABOLIC PANEL
Anion gap: 8 (ref 5–15)
BUN: 16 mg/dL (ref 8–23)
CALCIUM: 8.9 mg/dL (ref 8.9–10.3)
CO2: 26 mmol/L (ref 22–32)
CREATININE: 1.15 mg/dL (ref 0.61–1.24)
Chloride: 104 mmol/L (ref 98–111)
GFR calc non Af Amer: 60 mL/min — ABNORMAL LOW (ref >60)
GLUCOSE: 149 mg/dL — AB (ref 70–99)
Potassium: 3.7 mmol/L (ref 3.5–5.1)
Sodium: 138 mmol/L (ref 135–145)

## 2017-12-31 LAB — HEPARIN LEVEL (UNFRACTIONATED): Heparin Unfractionated: 0.12 IU/mL — ABNORMAL LOW (ref 0.30–0.70)

## 2017-12-31 LAB — TROPONIN I: Troponin I: 0.42 ng/mL (ref <0.03)

## 2017-12-31 LAB — HEMOGLOBIN A1C
Hgb A1c MFr Bld: 7.5 % — ABNORMAL HIGH (ref 4.8–5.6)
Mean Plasma Glucose: 168.55 mg/dL

## 2017-12-31 MED ORDER — PANTOPRAZOLE SODIUM 40 MG PO TBEC
40.0000 mg | DELAYED_RELEASE_TABLET | Freq: Two times a day (BID) | ORAL | Status: DC
  Administered 2017-12-31 – 2018-01-03 (×6): 40 mg via ORAL
  Filled 2017-12-31 (×6): qty 1

## 2017-12-31 MED ORDER — SODIUM CHLORIDE 0.9 % WEIGHT BASED INFUSION
3.0000 mL/kg/h | INTRAVENOUS | Status: DC
  Administered 2018-01-01: 3 mL/kg/h via INTRAVENOUS

## 2017-12-31 MED ORDER — BENAZEPRIL HCL 20 MG PO TABS
40.0000 mg | ORAL_TABLET | Freq: Every day | ORAL | Status: DC
  Administered 2018-01-01 – 2018-01-03 (×3): 40 mg via ORAL
  Filled 2017-12-31 (×2): qty 2
  Filled 2017-12-31: qty 4

## 2017-12-31 MED ORDER — NITROGLYCERIN 0.4 MG SL SUBL: 0.4000 mg | SUBLINGUAL_TABLET | SUBLINGUAL | Status: DC | PRN

## 2017-12-31 MED ORDER — FERROUS SULFATE 325 (65 FE) MG PO TABS
325.0000 mg | ORAL_TABLET | Freq: Two times a day (BID) | ORAL | Status: DC
  Administered 2017-12-31 – 2018-01-03 (×6): 325 mg via ORAL
  Filled 2017-12-31 (×6): qty 1

## 2017-12-31 MED ORDER — SODIUM CHLORIDE 0.9% FLUSH: 3.0000 mL | INTRAVENOUS | Status: DC | PRN

## 2017-12-31 MED ORDER — ACETAMINOPHEN 325 MG PO TABS
650.0000 mg | ORAL_TABLET | Freq: Four times a day (QID) | ORAL | Status: DC | PRN
  Administered 2018-01-01: 650 mg via ORAL
  Filled 2017-12-31 (×2): qty 2

## 2017-12-31 MED ORDER — ASPIRIN 81 MG PO CHEW
324.0000 mg | CHEWABLE_TABLET | Freq: Once | ORAL | Status: AC
  Administered 2017-12-31: 324 mg via ORAL
  Filled 2017-12-31: qty 4

## 2017-12-31 MED ORDER — SODIUM CHLORIDE 0.9% FLUSH
3.0000 mL | Freq: Two times a day (BID) | INTRAVENOUS | Status: DC
  Administered 2017-12-31: 3 mL via INTRAVENOUS

## 2017-12-31 MED ORDER — NITROGLYCERIN IN D5W 200-5 MCG/ML-% IV SOLN
5.0000 ug/min | Freq: Once | INTRAVENOUS | Status: DC
  Filled 2017-12-31 (×2): qty 250

## 2017-12-31 MED ORDER — ASPIRIN 81 MG PO CHEW
81.0000 mg | CHEWABLE_TABLET | ORAL | Status: AC
  Administered 2018-01-01: 81 mg via ORAL
  Filled 2017-12-31: qty 1

## 2017-12-31 MED ORDER — HEPARIN BOLUS VIA INFUSION
4000.0000 [IU] | Freq: Once | INTRAVENOUS | Status: AC
  Administered 2017-12-31: 4000 [IU] via INTRAVENOUS

## 2017-12-31 MED ORDER — SODIUM CHLORIDE 0.9 % WEIGHT BASED INFUSION
1.0000 mL/kg/h | INTRAVENOUS | Status: DC
  Administered 2018-01-01: 1 mL/kg/h via INTRAVENOUS

## 2017-12-31 MED ORDER — ASPIRIN EC 81 MG PO TBEC: 81.0000 mg | DELAYED_RELEASE_TABLET | Freq: Every day | ORAL | Status: DC

## 2017-12-31 MED ORDER — METOPROLOL SUCCINATE ER 50 MG PO TB24
50.0000 mg | ORAL_TABLET | Freq: Every day | ORAL | Status: DC
  Administered 2018-01-02 – 2018-01-03 (×2): 50 mg via ORAL
  Filled 2017-12-31 (×3): qty 1

## 2017-12-31 MED ORDER — ASPIRIN 81 MG PO CHEW: 324.0000 mg | CHEWABLE_TABLET | ORAL | Status: DC

## 2017-12-31 MED ORDER — HEPARIN (PORCINE) IN NACL 100-0.45 UNIT/ML-% IJ SOLN
1300.0000 [IU]/h | INTRAMUSCULAR | Status: DC
  Administered 2018-01-01: 1300 [IU]/h via INTRAVENOUS
  Filled 2017-12-31 (×3): qty 250

## 2017-12-31 MED ORDER — SODIUM CHLORIDE 0.9 % IV SOLN: 250.0000 mL | INTRAVENOUS | Status: DC | PRN

## 2017-12-31 MED ORDER — ATORVASTATIN CALCIUM 80 MG PO TABS
80.0000 mg | ORAL_TABLET | Freq: Every day | ORAL | Status: DC
  Administered 2018-01-01 – 2018-01-02 (×2): 80 mg via ORAL
  Filled 2017-12-31 (×2): qty 1

## 2017-12-31 MED ORDER — ASPIRIN 300 MG RE SUPP: 300.0000 mg | RECTAL | Status: DC

## 2017-12-31 MED ORDER — ASPIRIN 81 MG PO TABS: 81.0000 mg | ORAL_TABLET | Freq: Every day | ORAL | Status: DC

## 2017-12-31 MED ORDER — NITROGLYCERIN IN D5W 200-5 MCG/ML-% IV SOLN: 0.0000 ug/min | INTRAVENOUS | Status: DC

## 2017-12-31 MED ORDER — HYDRALAZINE HCL 20 MG/ML IJ SOLN
10.0000 mg | INTRAMUSCULAR | Status: AC
  Administered 2017-12-31: 10 mg via INTRAVENOUS
  Filled 2017-12-31: qty 1

## 2017-12-31 MED ORDER — ONDANSETRON HCL 4 MG/2ML IJ SOLN: 4.0000 mg | Freq: Four times a day (QID) | INTRAMUSCULAR | Status: DC | PRN

## 2017-12-31 NOTE — ED Notes (Signed)
CRITICAL VALUE ALERT  Critical Value:  Troponin 0.42  Date & Time Notied:  10/072019, Provider Notified: Dr. Sabra Heck   Orders Received/Actions taken: see chart

## 2017-12-31 NOTE — ED Notes (Signed)
[facility] in to transfer pt 

## 2017-12-31 NOTE — Progress Notes (Signed)
ANTICOAGULATION CONSULT NOTE - Initial Consult  Pharmacy Consult for heparin gtt  Indication: chest pain/ACS  Allergies  Allergen Reactions  . Amiodarone     rash    Patient Measurements: Height: 6' (182.9 cm) Weight: 227 lb 4.7 oz (103.1 kg) IBW/kg (Calculated) : 77.6 Heparin Dosing Weight: HEPARIN DW (KG): 98.8   Vital Signs: Temp: 98 F (36.7 C) (10/07 1313) Temp Source: Oral (10/07 1313) BP: 211/79 (10/07 1330) Pulse Rate: 53 (10/07 1330)  Labs: Recent Labs    12/31/17 1400  HGB 12.1*  HCT 38.3*  PLT 174  CREATININE 1.15  TROPONINI 0.42*    Estimated Creatinine Clearance: 66.8 mL/min (by C-G formula based on SCr of 1.15 mg/dL).   Medical History: Past Medical History:  Diagnosis Date  . Hypertension   . Leg fracture    Distal tibial/fibula 2003  . Paroxysmal atrial fibrillation (HCC)   . Pericardial effusion    Idiopathic, recurrent pericardial effusion s/p pericardial window.  . Prostate cancer ([place])    Seed implants 2007  . Supraventricular tachycardia ([place])   . Temporal arteritis (HCC)     Medications:   (Not in a hospital admission) Scheduled:  . heparin  4,000 Units Intravenous Once   Infusions:  . heparin     PRN: nitroGLYCERIN Anti-infectives (From admission, onward)   None      Assessment: [name] [name] a 77 y.o. male requires anticoagulation with a heparin iv infusion for the indication of  chest pain/ACS. Heparin gtt will be started following pharmacy protocol per pharmacy consult. Patient is not on previous oral anticoagulant that will require aPTT/HL correlation before transitioning to only HL monitoring.   Goal of Therapy:  Heparin level 0.3-0.7 units/ml Monitor platelets by anticoagulation protocol: Yes   Plan:  Give 4000 units bolus x 1 Start heparin infusion at 1000 units/hr Check anti-Xa level in 8 hours and daily while on heparin Continue to monitor H&H and platelets  Heparin level to be drawn in8 hours for  patients >60 years old or crcl < 44ml/min  [name] 12/31/2017,3:29 PM

## 2017-12-31 NOTE — H&P (Signed)
Admit date: 12/31/2017 Referring Physician [facility] Dr. Noemi Chapel Primary Cardiologist:  Dr. Johnny Bridge Chief complaint/reason for admission:chest pain  HPI: [name] [name] is a 77 y.o. male who is being seen today for the evaluation of chest pain at the request of Noemi Chapel, MD [facility].   Is a very pleasant 77 year old male has a history of hypertension, paroxysmal atrial fibrillation, idiopathic recurrent pericardial effusion status post pericardial window 2008 and SVT.  He recently started having exertional chest pain.  Patient states that usually he walks 4 miles 3 times per week but recently has not been able to do this because he is developing chest pain when he exerts himself.  He says now his chest pain comes on with very minimal exertion.  Tells me that he had pain yesterday while in church and then again later in the evening that occurred while he was laying down and lasted for an hour.  He says the pain over the past 24 to 48 hours is been coming and going even at night and is not been able to sleep because of the pain.  He has noted some dyspnea on exertion as well.    He is also had some problems recently with poorly controlled hypertension with systolic blood pressure as high as 200 mmHg.  Due to lower extremity edema his amlodipine was stopped and he was started on HCTZ by his family doctor.  Last 2D echocardiogram in August 2019 showed EF of 70 to 75% with grade 1 diastolic dysfunction and a small pericardial effusion.  ER troponin was mildly elevated at 0.42 and he is now transferred to [facility] for further evaluation with cardiac catheterization in the a.m.  Currently denies any chest pain and is on IV heparin as well as nitroglycerin drips.   PMH:    Past Medical History:  Diagnosis Date  . Hypertension   . Leg fracture    Distal tibial/fibula 2003  . Paroxysmal atrial fibrillation (HCC)   . Pericardial effusion    Idiopathic, recurrent pericardial effusion s/p  pericardial window.  . Prostate cancer ([place])    Seed implants 2007  . Supraventricular tachycardia ([place])   . Temporal arteritis (HCC)     PSH:    Past Surgical History:  Procedure Laterality Date  . CATARACT EXTRACTION W/PHACO Left 06/07/2015   Procedure: CATARACT EXTRACTION PHACO AND INTRAOCULAR LENS PLACEMENT LEFT EYE CDE=8.00;  Surgeon: Tonny Branch, MD;  Location: [facility];  Service: Ophthalmology;  Laterality: Left;  . CATARACT EXTRACTION W/PHACO Right 06/17/2015   Procedure: CATARACT EXTRACTION PHACO AND INTRAOCULAR LENS PLACEMENT RIGHT EYE CDE=11.09;  Surgeon: Tonny Branch, MD;  Location: [facility];  Service: Ophthalmology;  Laterality: Right;  . PERICARDIAL WINDOW  December 2008    ALLERGIES:   Amiodarone  Prior to Admit Meds:   Medications Prior to Admission  Medication Sig Dispense Refill Last Dose  . allopurinol (ZYLOPRIM) 300 MG tablet Take 450 mg by mouth daily.    12/31/2017 at Unknown time  . aspirin 81 MG tablet Take 81 mg by mouth daily.   12/31/2017 at Unknown time  . benazepril (LOTENSIN) 20 MG tablet Take 20 mg by mouth daily.   12/31/2017 at Unknown time  . ferrous sulfate 325 (65 FE) MG tablet Take 325 mg by mouth 2 (two) times daily.   12/31/2017 at Unknown time  . flecainide (TAMBOCOR) 50 MG tablet TAKE ONE TABLET BY MOUTH TWICE DAILY 180 tablet 3 12/31/2017 at Unknown time  .  metoprolol succinate (TOPROL-XL) 50 MG 24 hr tablet TAKE 1 TABLET BY MOUTH ONCE DAILY WITH FOOD OR  IMMEDIATELY  FOLLOWING  A  MEAL 90 tablet 0 12/31/2017 at 645 AM  . Multiple Vitamin (MULTIVITAMIN) tablet Take 1 tablet by mouth daily.   12/31/2017 at Unknown time  . pantoprazole (PROTONIX) 40 MG tablet TAKE 1 TABLET BY MOUTH ONCE DAILY (Patient taking differently: Take 40 mg by mouth 2 (two) times daily. ) 90 tablet 3 12/31/2017 at Unknown time  . vitamin C (ASCORBIC ACID) 500 MG tablet Take 500 mg by mouth daily.   12/31/2017 at Unknown time  . chlorthalidone (HYGROTON) 25 MG tablet TAKE ONE TABLET BY MOUTH  ONCE DAILY 30 tablet 3 Unknown  . hydrochlorothiazide (HYDRODIURIL) 12.5 MG tablet Take 12.5 mg by mouth daily. for high blood pressure  1    Family HX:    Family History  Problem Relation Age of Onset  . CAD Mother        MI in 1s  . Prostate cancer Father   . Prostate cancer Brother   . Lung cancer Brother    Social HX:    Social History   Socioeconomic History  . Marital status: Married    Spouse name: Not on file  . Number of children: Not on file  . Years of education: Not on file  . Highest education level: Not on file  Occupational History  . Not on file  Social Needs  . Financial resource strain: Not on file  . Food insecurity:    Worry: Not on file    Inability: Not on file  . Transportation needs:    Medical: Not on file    Non-medical: Not on file  Tobacco Use  . Smoking status: Former Smoker    Packs/day: 0.30    Years: 3.00    Pack years: 0.90    Types: Cigarettes    Start date: 08/06/1958    Last attempt to quit: 03/27/1960    Years since quitting: 57.8  . Smokeless tobacco: Never Used  Substance and Sexual Activity  . Alcohol use: No    Alcohol/week: 0.0 standard drinks  . Drug use: No  . Sexual activity: Not on file  Lifestyle  . Physical activity:    Days per week: Not on file    Minutes per session: Not on file  . Stress: Not on file  Relationships  . Social connections:    Talks on phone: Not on file    Gets together: Not on file    Attends religious service: Not on file    Active member of club or organization: Not on file    Attends meetings of clubs or organizations: Not on file    Relationship status: Not on file  . Intimate partner violence:    Fear of current or ex partner: Not on file    Emotionally abused: Not on file    Physically abused: Not on file    Forced sexual activity: Not on file  Other Topics Concern  . Not on file  Social History Narrative  . Not on file     ROS:  All ROS were addressed and are negative except  what is stated in the HPI  PHYSICAL EXAM Vitals:   12/31/17 1630 12/31/17 1827  BP: 138/65 (!) 182/71  Pulse: (!) 51 (!) 53  Resp: 14 16  Temp:    SpO2: 96% 97%   General: Well developed, well nourished, in no  acute distress Head: Eyes PERRLA, No xanthomas.   Normal cephalic and atramatic  Lungs:   Clear bilaterally to auscultation and percussion. Heart:   HRRR S1 S2 Pulses are 2+ & equal.            No carotid bruit. No JVD.  No abdominal bruits. No femoral bruits. Abdomen: Bowel sounds are positive, abdomen soft and non-tender without masses or                  Hernia's noted. Msk:  Back normal, normal gait. Normal strength and tone for age. Extremities:   No clubbing, cyanosis or edema.  DP +1 Neuro: Alert and oriented X 3. Psych:  Good affect, responds appropriately   Labs:   Lab Results  Component Value Date   WBC 6.1 12/31/2017   HGB 12.1 (L) 12/31/2017   HCT 38.3 (L) 12/31/2017   MCV 83.8 12/31/2017   PLT 174 12/31/2017    Recent Labs  Lab 12/31/17 1400  NA 138  K 3.7  CL 104  CO2 26  BUN 16  CREATININE 1.15  CALCIUM 8.9  GLUCOSE 149*   Lab Results  Component Value Date   CKTOTAL 155 06/20/2007   CKMB 2.5 06/20/2007   TROPONINI 0.42 (HH) 12/31/2017   No results found for: PTT Lab Results  Component Value Date   INR 1.12 12/30/2013   INR 1.1 06/20/2007   INR 1.1 06/19/2007    No results found for: CHOL No results found for: HDL No results found for: LDLCALC No results found for: TRIG No results found for: CHOLHDL No results found for: LDLDIRECT    Radiology:  No results found.   Telemetry    Normal sinus rhythm- Personally Reviewed  ECG    On his bradycardia 55 bpm with nonspecific ST-T wave of normality and incomplete right bundle branch block possible old inferior infarct- Personally Reviewed   ASSESSMENT/PLAN:   1.  NSTEMI/crescendo angina -has had increasing chest pain with exertion over the past couple of weeks that is now  occurring with minimal exertion. -Troponin mildly elevated in the ER at 0.43 -He is currently chest pain-free -2D echo August 2019 showed hyperdynamic LV function with EF 70 to 75% with grade 1 diastolic dysfunction.  There is also a small pericardial effusion. -EKG shows nonspecific ST-T wave of abnormality in the setting of incomplete right bundle branch block.  This is new from prior EKG in January 2019 -Troponin elevation could be due to hypertensive urgency but his symptoms are consistent with ACS -Admit to stepdown unit -Continue IV heparin and nitroglycerin drips and titrate nitro for pain-free -Aggressive blood pressure control. -Cannot increase beta-blockers due to baseline bradycardia -Continue to cycle troponin until it peaks -aspirin 81 mg daily and high-dose statin with Lipitor 80 mg daily -Repeat 2D echocardiogram in the a.m. to assess for progression of pericardial effusion although his symptoms are more consistent with an ACS -NPO after midnight for cardiac catheterization in the a.m. -Cardiac catheterization was discussed with the patient fully. The patient understands that risks include but are not limited to stroke (1 in 1000), death (1 in 47), kidney failure [usually temporary] (1 in 500), bleeding (1 in 200), allergic reaction [possibly serious] (1 in 200).  The patient understands and is willing to proceed.    2.  History of idiopathic recurrent pericardial effusion with pericardial window in 2008 -Did have a small pericardial effusion noted on echo in August so I will repeat echo to make  sure this is not gotten bigger  3.  Hypertensive urgency -BP was 211/79 mmHg on admission to ER today. -Needs aggressive blood pressure control. -He received 10 mg of IV hydralazine and started on nitro drip at any pain ER -His last BP was 181/72 mmHg. -Continue IV NTG drip and titrate to keep systolic blood pressure less than 150 mmHg -Cannot increase beta-blocker further due to  bradycardia. -Will continue on home dose of Toprol 50 mg daily -Hold HCTZ and chlorthalidone for cath in the a.m. -Increase Lotensin to 40 mg daily  -Avoid amlodipine due to history of lower extremity edema  4.  History of SVT/PAF -he has not had any recent palpitations -Continue flecainide 50 mg twice daily along with metoprolol succinate 50 mg daily. -CHADS2VASC score is 3 and has refused anticoagulation in the past but is open to consider post cath.    Fransico Him, MD  12/31/2017  7:53 PM

## 2017-12-31 NOTE — ED Provider Notes (Signed)
[facility] EMERGENCY DEPARTMENT Provider Note   CSN: [id number] Arrival date & time: 12/31/17  1300     History   Chief Complaint Chief Complaint  Patient presents with  . Chest Pain    HPI [name] [name] is a 77 y.o. male.  HPI  The pt is a 77 y/o male - followed by Dr. Domenic Polite, presents with increased chest pain on exertion - states he usually walks 4 miles 3 times per week - recently has had difficulty with this b/c of CP on exertion - no fevers but has had swelling of the legs after amlodipine - currently asymptomatic.  He does have hx ofSVT,  Paroxysmal A fib, pericardial effusion s/p window and Prostate CA with implants.  Sx are now coming on with very little exertion.  He reports that he has been having severe hypertension recently with systolic blood pressures higher than 200.  He recently had his amlodipine discontinued and started hydrochlorothiazide within the last 3 days by his family doctor.  Symptoms are mild but worsening with exertion Symptoms are currently resolved Symptoms are not associated with fevers or swelling of the legs Symptoms are not similar to prior symptoms.  Medical record review show that the patient had his last echocardiogram in August 2019, ejection fraction was 70 to 75%, there was grade 1 diastolic dysfunction with abnormal left ventricular relaxation.  Trivial aortic regurgitation, small pericardial effusion was identified at the time.    Last Stress was 10/15, normal at that time.  Past Medical History:  Diagnosis Date  . Hypertension   . Leg fracture    Distal tibial/fibula 2003  . Paroxysmal atrial fibrillation (HCC)   . Pericardial effusion    Idiopathic, recurrent pericardial effusion s/p pericardial window.  . Prostate cancer ([place])    Seed implants 2007  . Supraventricular tachycardia ([place])   . Temporal arteritis [facility])     Patient Active Problem List   Diagnosis Date Noted  . NSTEMI (non-ST elevated myocardial infarction)  ([place]) 12/31/2017  . Sinus bradycardia 01/14/2014  . Pericardial effusion 12/31/2013  . Paroxysmal atrial fibrillation (HCC)   . Supraventricular tachycardia ([place]) 10/17/2010    Past Surgical History:  Procedure Laterality Date  . CATARACT EXTRACTION W/PHACO Left 06/07/2015   Procedure: CATARACT EXTRACTION PHACO AND INTRAOCULAR LENS PLACEMENT LEFT EYE CDE=8.00;  Surgeon: Tonny Branch, MD;  Location: [facility];  Service: Ophthalmology;  Laterality: Left;  . CATARACT EXTRACTION W/PHACO Right 06/17/2015   Procedure: CATARACT EXTRACTION PHACO AND INTRAOCULAR LENS PLACEMENT RIGHT EYE CDE=11.09;  Surgeon: Tonny Branch, MD;  Location: [facility];  Service: Ophthalmology;  Laterality: Right;  . PERICARDIAL WINDOW  December 2008        Home Medications    Prior to Admission medications   Medication Sig Start Date End Date Taking? Authorizing Provider  allopurinol (ZYLOPRIM) 300 MG tablet Take 450 mg by mouth daily.    Yes [provider]  aspirin 81 MG tablet Take 81 mg by mouth daily.   Yes [provider]  benazepril (LOTENSIN) 20 MG tablet Take 20 mg by mouth daily.   Yes [provider]  ferrous sulfate 325 (65 FE) MG tablet Take 325 mg by mouth 2 (two) times daily.   Yes [provider]  flecainide (TAMBOCOR) 50 MG tablet TAKE ONE TABLET BY MOUTH TWICE DAILY 04/09/17  Yes Satira Sark, MD  metoprolol succinate (TOPROL-XL) 50 MG 24 hr tablet TAKE 1 TABLET BY MOUTH ONCE DAILY WITH FOOD OR  IMMEDIATELY  FOLLOWING  A  MEAL 10/18/17  Yes Satira Sark, MD  Multiple Vitamin (MULTIVITAMIN) tablet Take 1 tablet by mouth daily.   Yes [provider]  pantoprazole (PROTONIX) 40 MG tablet TAKE 1 TABLET BY MOUTH ONCE DAILY Patient taking differently: Take 40 mg by mouth 2 (two) times daily.  12/12/17  Yes Satira Sark, MD  vitamin C (ASCORBIC ACID) 500 MG tablet Take 500 mg by mouth daily.   Yes [provider]  chlorthalidone (HYGROTON) 25 MG  tablet TAKE ONE TABLET BY MOUTH ONCE DAILY 08/09/15   Satira Sark, MD  hydrochlorothiazide (HYDRODIURIL) 12.5 MG tablet Take 12.5 mg by mouth daily. for high blood pressure 12/28/17   [provider]    Family History Family History  Problem Relation Age of Onset  . CAD Mother        MI in 21s  . Prostate cancer Father   . Prostate cancer Brother   . Lung cancer Brother     Social History Social History   Tobacco Use  . Smoking status: Former Smoker    Packs/day: 0.30    Years: 3.00    Pack years: 0.90    Types: Cigarettes    Start date: 08/06/1958    Last attempt to quit: 03/27/1960    Years since quitting: 57.8  . Smokeless tobacco: Never Used  Substance Use Topics  . Alcohol use: No    Alcohol/week: 0.0 standard drinks  . Drug use: No     Allergies   Amiodarone   Review of Systems Review of Systems  All other systems reviewed and are negative.    Physical Exam Updated Vital Signs BP (!) 211/79   Pulse (!) 53   Temp 98 F (36.7 C) (Oral)   Resp 14   Ht 1.829 m (6')   Wt 103.1 kg   SpO2 97%   BMI 30.83 kg/m   Physical Exam  Constitutional: He appears well-developed and well-nourished. No distress.  HENT:  Head: Normocephalic and atraumatic.  Mouth/Throat: Oropharynx is clear and moist. No oropharyngeal exudate.  Eyes: Pupils are equal, round, and reactive to light. Conjunctivae and EOM are normal. Right eye exhibits no discharge. Left eye exhibits no discharge. No scleral icterus.  Neck: Normal range of motion. Neck supple. No JVD present. No thyromegaly present.  Cardiovascular: Normal rate, regular rhythm, normal heart sounds and intact distal pulses. Exam reveals no gallop and no friction rub.  No murmur heard. Pulmonary/Chest: Effort normal and breath sounds normal. No respiratory distress. He has no wheezes. He has no rales.  Abdominal: Soft. Bowel sounds are normal. He exhibits no distension and no mass. There is no tenderness.    Musculoskeletal: Normal range of motion. He exhibits edema ( Scant pitting edema at the ankles bilaterally). He exhibits no tenderness.  Lymphadenopathy:    He has no cervical adenopathy.  Neurological: He is alert. Coordination normal.  Skin: Skin is warm and dry. No rash noted. No erythema.  Psychiatric: He has a normal mood and affect. His behavior is normal.  Nursing note and vitals reviewed.    ED Treatments / Results  Labs (all labs ordered are listed, but only abnormal results are displayed) Labs Reviewed  TROPONIN I - Abnormal; Notable for the following components:      Result Value   Troponin I 0.42 (*)    All other components within normal limits  CBC WITH DIFFERENTIAL/PLATELET - Abnormal; Notable for the following components:  Hemoglobin 12.1 (*)    HCT 38.3 (*)    RDW 18.5 (*)    All other components within normal limits  BASIC METABOLIC PANEL - Abnormal; Notable for the following components:   Glucose, Bld 149 (*)    GFR calc non Af Amer 60 (*)    All other components within normal limits  BRAIN NATRIURETIC PEPTIDE - Abnormal; Notable for the following components:   B Natriuretic Peptide 210.0 (*)    All other components within normal limits  HEPARIN LEVEL (UNFRACTIONATED)    EKG EKG Interpretation  Date/Time:  Monday December 31 2017 13:09:28 EDT Ventricular Rate:  55 PR Interval:    QRS Duration: 106 QT Interval:  461 QTC Calculation: 441 R Axis:   -42 Text Interpretation:  Sinus rhythm Probable left atrial enlargement RSR' in V1 or V2, right VCD or RVH Inferior infarct, old since last tracing no significant change Confirmed by Noemi Chapel [phone]) on 12/31/2017 1:21:58 PM   Radiology No results found.  Procedures .Critical Care Performed by: Noemi Chapel, MD Authorized by: Noemi Chapel, MD   Critical care provider statement:    Critical care time (minutes):  35   Critical care time was exclusive of:  Separately billable procedures and  treating other patients and teaching time   Critical care was necessary to treat or prevent imminent or life-threatening deterioration of the following conditions:  Sepsis   Critical care was time spent personally by me on the following activities:  Blood draw for specimens, development of treatment plan with patient or surrogate, discussions with consultants, evaluation of patient's response to treatment, examination of patient, obtaining history from patient or surrogate, ordering and performing treatments and interventions, ordering and review of laboratory studies, ordering and review of radiographic studies, pulse oximetry, re-evaluation of patient's condition and review of old charts   (including critical care time)  Medications Ordered in ED Medications  nitroGLYCERIN (NITROSTAT) SL tablet 0.4 mg (has no administration in time range)  heparin bolus via infusion 4,000 Units (has no administration in time range)  heparin ADULT infusion 100 units/mL (25000 units/224mL sodium chloride 0.45%) (has no administration in time range)  hydrALAZINE (APRESOLINE) injection 10 mg (10 mg Intravenous Given 12/31/17 1408)  aspirin chewable tablet 324 mg (324 mg Oral Given 12/31/17 1406)  nitroGLYCERIN 50 mg in dextrose 5 % 250 mL (0.2 mg/mL) infusion (5 mcg/min Intravenous New Bag/Given 12/31/17 1521)     Initial Impression / Assessment and Plan / ED Course  I have reviewed the triage vital signs and the nursing notes.  Pertinent labs & imaging results that were available during my care of the patient were reviewed by me and considered in my medical decision making (see chart for details).    The EKG does not show any signs of acute ischemia however the patient is having exertional symptoms which are coming on with less and less exertion raising suspicion for unstable angina.  With a blood pressure that is well over 200 this may be part of the reason as well.  At this point I will discuss with cardiology,  he likely needs to have inpatient evaluation with blood pressure control and cardiac rule out.  I do not think this is consistent with pneumothorax dissection or pulmonary embolism.  The patient will be given hydralazine, nitroglycerin, aspirin and repeat evaluation.  The patient is critically ill, he has a severe hypertension and unfortunately an elevated troponin at 0.42.  I have discussed his care with the local  cardiology team, the physician assistant recommends that we send to [facility] given the elevated troponin and ongoing active mild chest pain.  The patient will be given heparin, nitroglycerin drip, aspirin, likely transport to [facility], waiting for cardiology at [facility] to respond for transfer.  Discussed with Dr. Harrell Gave, she has kindly accepted the patient to the stepdown unit.  Patient is currently on a nitroglycerin drip.   Final Clinical Impressions(s) / ED Diagnoses   Final diagnoses:  NSTEMI (non-ST elevated myocardial infarction) [facility])  Hypertensive crisis    ED Discharge Orders    None       Noemi Chapel, MD 12/31/17 1531

## 2017-12-31 NOTE — ED Triage Notes (Signed)
Pt reports chest pain since Thursday intermittently with exertion.  Pt states he normally walks 3 miles 4 days a week and on Thursday he was unable to finish his walk due to chest pain and left arm pain.  Went to pcp and was given new meds but still is having exertional pain.  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  Called cardiology and told to come to ed.  Pt has mild swelling in legs, but reports this is much better than usual.

## 2017-12-31 NOTE — Telephone Encounter (Signed)
Spoke with patient who c/o elevated BP 200/87 this am, Chest tightness and tingling in arm at times . Pt denies SOB. States that he was seen at PCP office and they are unable to control BP. Current BP is 225/99 HR is 57. No other complaints at this time. Pt encouraged to be seen in the ER.

## 2017-12-31 NOTE — Telephone Encounter (Signed)
Patient feels tightness and tingling in his chest when he walks and  BP has been sky high past couple weeks.  PCP can not get BP to come down and he is scared that he has a blockage again.

## 2017-12-31 NOTE — Progress Notes (Signed)
Cardiology [facility] called and updated with BP at 182/71. New order to uptitrate Ntg to control BP. Tylenol 650 mg q 6H PRN for headache.

## 2018-01-01 ENCOUNTER — Other Ambulatory Visit: Payer: Self-pay

## 2018-01-01 ENCOUNTER — Encounter (HOSPITAL_COMMUNITY): Payer: Self-pay | Admitting: General Practice

## 2018-01-01 ENCOUNTER — Inpatient Hospital Stay (HOSPITAL_COMMUNITY): Payer: Medicare HMO

## 2018-01-01 ENCOUNTER — Inpatient Hospital Stay (HOSPITAL_COMMUNITY)
Admission: EM | Disposition: A | Discharge status: Home / Self Care | Payer: Self-pay | Source: Home / Self Care | Attending: Cardiology

## 2018-01-01 DIAGNOSIS — I2510 Atherosclerotic heart disease of native coronary artery without angina pectoris: Secondary | ICD-10-CM

## 2018-01-01 HISTORY — PX: CORONARY STENT INTERVENTION: CATH118234

## 2018-01-01 HISTORY — PX: LEFT HEART CATH AND CORONARY ANGIOGRAPHY: CATH118249

## 2018-01-01 HISTORY — PX: CORONARY ANGIOPLASTY WITH STENT PLACEMENT: SHX49

## 2018-01-01 LAB — COMPREHENSIVE METABOLIC PANEL
ALK PHOS: 74 U/L (ref 38–126)
ALT: 18 U/L (ref 0–44)
AST: 23 U/L (ref 15–41)
Albumin: 3.7 g/dL (ref 3.5–5.0)
Anion gap: 8 (ref 5–15)
BUN: 16 mg/dL (ref 8–23)
CALCIUM: 9.5 mg/dL (ref 8.9–10.3)
CHLORIDE: 104 mmol/L (ref 98–111)
CO2: 26 mmol/L (ref 22–32)
CREATININE: 1.13 mg/dL (ref 0.61–1.24)
GFR calc Af Amer: >60 mL/min (ref >60)
Glucose, Bld: 112 mg/dL — ABNORMAL HIGH (ref 70–99)
Potassium: 4 mmol/L (ref 3.5–5.1)
Sodium: 138 mmol/L (ref 135–145)
Total Bilirubin: 0.3 mg/dL (ref 0.3–1.2)
Total Protein: 6.6 g/dL (ref 6.5–8.1)

## 2018-01-01 LAB — BASIC METABOLIC PANEL
ANION GAP: 7 (ref 5–15)
BUN: 17 mg/dL (ref 8–23)
CHLORIDE: 106 mmol/L (ref 98–111)
CO2: 22 mmol/L (ref 22–32)
Calcium: 8.7 mg/dL — ABNORMAL LOW (ref 8.9–10.3)
Creatinine, Ser: 0.87 mg/dL (ref 0.61–1.24)
GFR calc Af Amer: >60 mL/min (ref >60)
Glucose, Bld: 131 mg/dL — ABNORMAL HIGH (ref 70–99)
POTASSIUM: 4 mmol/L (ref 3.5–5.1)
SODIUM: 135 mmol/L (ref 135–145)

## 2018-01-01 LAB — LIPID PANEL
Cholesterol: 142 mg/dL (ref 0–200)
HDL: 34 mg/dL — AB (ref >40)
LDL Cholesterol: 93 mg/dL (ref 0–99)
TRIGLYCERIDES: 73 mg/dL (ref <150)
Total CHOL/HDL Ratio: 4.2 RATIO
VLDL: 15 mg/dL (ref 0–40)

## 2018-01-01 LAB — TSH: TSH: 5.639 u[IU]/mL — ABNORMAL HIGH (ref 0.350–4.500)

## 2018-01-01 LAB — MRSA PCR SCREENING: MRSA BY PCR: NEGATIVE

## 2018-01-01 LAB — TROPONIN I
TROPONIN I: 0.38 ng/mL — AB (ref <0.03)
Troponin I: 0.48 ng/mL (ref <0.03)
Troponin I: 0.36 ng/mL (ref <0.03)

## 2018-01-01 LAB — MAGNESIUM: Magnesium: 2.1 mg/dL (ref 1.7–2.4)

## 2018-01-01 LAB — POCT ACTIVATED CLOTTING TIME
ACTIVATED CLOTTING TIME: 153 s
ACTIVATED CLOTTING TIME: 389 s

## 2018-01-01 LAB — HEPARIN LEVEL (UNFRACTIONATED): HEPARIN UNFRACTIONATED: 0.44 [IU]/mL (ref 0.30–0.70)

## 2018-01-01 SURGERY — LEFT HEART CATH AND CORONARY ANGIOGRAPHY
Anesthesia: LOCAL

## 2018-01-01 MED ORDER — VERAPAMIL HCL 2.5 MG/ML IV SOLN
INTRAVENOUS | Status: DC | PRN
  Administered 2018-01-01: 10 mL via INTRA_ARTERIAL

## 2018-01-01 MED ORDER — HEPARIN SODIUM (PORCINE) 1000 UNIT/ML IJ SOLN
INTRAMUSCULAR | Status: AC
  Filled 2018-01-01: qty 1

## 2018-01-01 MED ORDER — ANGIOPLASTY BOOK
Freq: Once | Status: DC
  Filled 2018-01-01: qty 1

## 2018-01-01 MED ORDER — MIDAZOLAM HCL 2 MG/2ML IJ SOLN
INTRAMUSCULAR | Status: AC
  Filled 2018-01-01: qty 2

## 2018-01-01 MED ORDER — SODIUM CHLORIDE 0.9 % IV SOLN
INTRAVENOUS | Status: DC | PRN
  Administered 2018-01-01: 4 ug/kg/min via INTRAVENOUS

## 2018-01-01 MED ORDER — LIDOCAINE HCL (PF) 1 % IJ SOLN
INTRAMUSCULAR | Status: AC
  Filled 2018-01-01: qty 30

## 2018-01-01 MED ORDER — HYDRALAZINE HCL 20 MG/ML IJ SOLN
5.0000 mg | INTRAMUSCULAR | Status: AC | PRN
  Administered 2018-01-01: 5 mg via INTRAVENOUS
  Filled 2018-01-01: qty 1

## 2018-01-01 MED ORDER — LABETALOL HCL 5 MG/ML IV SOLN: 10.0000 mg | INTRAVENOUS | Status: AC | PRN

## 2018-01-01 MED ORDER — IOHEXOL 350 MG/ML SOLN
INTRAVENOUS | Status: DC | PRN
  Administered 2018-01-01: 165 mL via INTRA_ARTERIAL

## 2018-01-01 MED ORDER — SODIUM CHLORIDE 0.9% FLUSH
3.0000 mL | Freq: Two times a day (BID) | INTRAVENOUS | Status: DC
  Administered 2018-01-01 – 2018-01-02 (×3): 3 mL via INTRAVENOUS

## 2018-01-01 MED ORDER — HEART ATTACK BOUNCING BOOK
Freq: Once | Status: DC
  Filled 2018-01-01: qty 1

## 2018-01-01 MED ORDER — TICAGRELOR 90 MG PO TABS
ORAL_TABLET | ORAL | Status: DC | PRN
  Administered 2018-01-01: 180 mg via ORAL

## 2018-01-01 MED ORDER — SODIUM CHLORIDE 0.9 % IV SOLN
4.0000 ug/kg/min | INTRAVENOUS | Status: AC
  Administered 2018-01-01: 17:00:00 4 ug/kg/min via INTRAVENOUS
  Filled 2018-01-01 (×2): qty 50

## 2018-01-01 MED ORDER — NITROGLYCERIN 1 MG/10 ML FOR IR/CATH LAB
INTRA_ARTERIAL | Status: AC
  Filled 2018-01-01: qty 10

## 2018-01-01 MED ORDER — MIDAZOLAM HCL 2 MG/2ML IJ SOLN
INTRAMUSCULAR | Status: DC | PRN
  Administered 2018-01-01 (×2): 1 mg via INTRAVENOUS

## 2018-01-01 MED ORDER — HEPARIN (PORCINE) IN NACL 1000-0.9 UT/500ML-% IV SOLN
INTRAVENOUS | Status: DC | PRN
  Administered 2018-01-01 (×2): 500 mL

## 2018-01-01 MED ORDER — ACETAMINOPHEN 325 MG PO TABS: 650.0000 mg | ORAL_TABLET | ORAL | Status: DC | PRN

## 2018-01-01 MED ORDER — FENTANYL CITRATE (PF) 100 MCG/2ML IJ SOLN
INTRAMUSCULAR | Status: AC
  Filled 2018-01-01: qty 2

## 2018-01-01 MED ORDER — SODIUM CHLORIDE 0.9 % IV SOLN: 250.0000 mL | INTRAVENOUS | Status: DC | PRN

## 2018-01-01 MED ORDER — SODIUM CHLORIDE 0.9 % IV SOLN: INTRAVENOUS | Status: AC

## 2018-01-01 MED ORDER — LIDOCAINE HCL (PF) 1 % IJ SOLN
INTRAMUSCULAR | Status: DC | PRN
  Administered 2018-01-01: 2 mL

## 2018-01-01 MED ORDER — HEPARIN BOLUS VIA INFUSION
3000.0000 [IU] | Freq: Once | INTRAVENOUS | Status: AC
  Administered 2018-01-01: 3000 [IU] via INTRAVENOUS
  Filled 2018-01-01: qty 3000

## 2018-01-01 MED ORDER — VERAPAMIL HCL 2.5 MG/ML IV SOLN
INTRAVENOUS | Status: AC
  Filled 2018-01-01: qty 2

## 2018-01-01 MED ORDER — ASPIRIN 81 MG PO CHEW
81.0000 mg | CHEWABLE_TABLET | Freq: Every day | ORAL | Status: DC
  Administered 2018-01-02 – 2018-01-03 (×2): 81 mg via ORAL
  Filled 2018-01-01 (×2): qty 1

## 2018-01-01 MED ORDER — HEPARIN SODIUM (PORCINE) 1000 UNIT/ML IJ SOLN
INTRAMUSCULAR | Status: DC | PRN
  Administered 2018-01-01: 6000 [IU] via INTRAVENOUS
  Administered 2018-01-01: 8000 [IU] via INTRAVENOUS

## 2018-01-01 MED ORDER — SODIUM CHLORIDE 0.9% FLUSH: 3.0000 mL | INTRAVENOUS | Status: DC | PRN

## 2018-01-01 MED ORDER — FENTANYL CITRATE (PF) 100 MCG/2ML IJ SOLN
INTRAMUSCULAR | Status: DC | PRN
  Administered 2018-01-01 (×2): 25 ug via INTRAVENOUS

## 2018-01-01 MED ORDER — CANGRELOR TETRASODIUM 50 MG IV SOLR
INTRAVENOUS | Status: AC
  Filled 2018-01-01: qty 50

## 2018-01-01 MED ORDER — TICAGRELOR 90 MG PO TABS
90.0000 mg | ORAL_TABLET | Freq: Two times a day (BID) | ORAL | Status: DC
  Administered 2018-01-02 – 2018-01-03 (×4): 90 mg via ORAL
  Filled 2018-01-01 (×4): qty 1

## 2018-01-01 MED ORDER — TICAGRELOR 90 MG PO TABS
ORAL_TABLET | ORAL | Status: AC
  Filled 2018-01-01: qty 2

## 2018-01-01 MED ORDER — ONDANSETRON HCL 4 MG/2ML IJ SOLN: 4.0000 mg | Freq: Four times a day (QID) | INTRAMUSCULAR | Status: DC | PRN

## 2018-01-01 MED ORDER — CANGRELOR BOLUS VIA INFUSION
INTRAVENOUS | Status: DC | PRN
  Administered 2018-01-01: 2979 ug via INTRAVENOUS

## 2018-01-01 MED ORDER — SODIUM CHLORIDE 0.9 % IV SOLN
INTRAVENOUS | Status: DC | PRN
  Administered 2018-01-01: 250 mL via INTRAVENOUS

## 2018-01-01 MED ORDER — HEPARIN (PORCINE) IN NACL 1000-0.9 UT/500ML-% IV SOLN
INTRAVENOUS | Status: AC
  Filled 2018-01-01: qty 1000

## 2018-01-01 SURGICAL SUPPLY — 27 items
BALLN SAPPHIRE 2.5X10 (BALLOONS) ×2
BALLN SAPPHIRE 2.5X15 (BALLOONS) ×2
BALLN SAPPHIRE ~~LOC~~ 3.5X10 (BALLOONS) ×1 IMPLANT
BALLOON SAPPHIRE 2.5X10 (BALLOONS) IMPLANT
BALLOON SAPPHIRE 2.5X15 (BALLOONS) IMPLANT
CATH 5FR JL3.5 JR4 ANG PIG MP (CATHETERS) ×1 IMPLANT
CATH INFINITI 5 FR 3DRC (CATHETERS) ×1 IMPLANT
CATH INFINITI 5FR AL1 (CATHETERS) ×1 IMPLANT
CATH LAUNCHER 5F AL1 (CATHETERS) IMPLANT
CATH LAUNCHER 6FR EBU3.5 (CATHETERS) ×1 IMPLANT
CATHETER LAUNCHER 5F AL1 (CATHETERS) ×2
DEVICE RAD COMP TR BAND LRG (VASCULAR PRODUCTS) ×1 IMPLANT
GLIDESHEATH SLEND A-KIT 6F 22G (SHEATH) ×1 IMPLANT
GLIDESHEATH SLEND SS 6F .021 (SHEATH) ×1 IMPLANT
GUIDEWIRE .025 260CM (WIRE) ×1 IMPLANT
GUIDEWIRE INQWIRE 1.5J.035X260 (WIRE) IMPLANT
INQWIRE 1.5J .035X260CM (WIRE) ×2
KIT ENCORE 26 ADVANTAGE (KITS) ×1 IMPLANT
KIT HEART LEFT (KITS) ×2 IMPLANT
KIT HEMO VALVE WATCHDOG (MISCELLANEOUS) ×1 IMPLANT
PACK CARDIAC CATHETERIZATION (CUSTOM PROCEDURE TRAY) ×2 IMPLANT
STENT SIERRA 3.25 X 15 MM (Permanent Stent) ×1 IMPLANT
TRANSDUCER W/STOPCOCK (MISCELLANEOUS) ×2 IMPLANT
TUBING CIL FLEX 10 FLL-RA (TUBING) ×2 IMPLANT
WIRE ASAHI PROWATER 180CM (WIRE) ×2 IMPLANT
WIRE HI TORQ BMW 190CM (WIRE) ×1 IMPLANT
WIRE HI TORQ VERSACORE-J 145CM (WIRE) ×1 IMPLANT

## 2018-01-01 NOTE — Progress Notes (Signed)
Progress Note  Patient Name: [name] [name] Date of Encounter: 01/01/2018  Primary Cardiologist: Rozann Lesches, MD   Subjective   Feels well this am. Chest pain resolved. No dyspnea.  Inpatient Medications    Scheduled Meds: . [START ON 01/02/2018] aspirin EC  81 mg Oral Daily  . atorvastatin  80 mg Oral q1800  . benazepril  40 mg Oral Daily  . ferrous sulfate  325 mg Oral BID WC  . metoprolol succinate  50 mg Oral Daily  . pantoprazole  40 mg Oral BID  . sodium chloride flush  3 mL Intravenous Q12H   Continuous Infusions: . sodium chloride    . sodium chloride 1 mL/kg/hr (01/01/18 0733)  . heparin 1,300 Units/hr (01/01/18 0729)  . nitroGLYCERIN 20 mcg/min (12/31/17 2313)   PRN Meds: sodium chloride, acetaminophen, nitroGLYCERIN, ondansetron (ZOFRAN) IV, sodium chloride flush   Vital Signs    Vitals:   01/01/18 0425 01/01/18 0455 01/01/18 0526 01/01/18 0555  BP: 140/60 (!) 156/64 (!) 160/70 (!) 148/109  Pulse:   (!) 50   Resp:      Temp:   98.3 F (36.8 C)   TempSrc:   Oral   SpO2:   95%   Weight:   99.3 kg   Height:        Intake/Output Summary (Last 24 hours) at 01/01/2018 0806 Last data filed at 01/01/2018 0731 Gross per 24 hour  Intake 902.87 ml  Output 840 ml  Net 62.87 ml   Filed Weights   12/31/17 1315 12/31/17 1827 01/01/18 0526  Weight: 103.1 kg 99.3 kg 99.3 kg    Telemetry    Sinus brady - Personally Reviewed  ECG    Sinus brady, LAD.  - Personally Reviewed  Physical Exam   GEN: No acute distress.   Neck: No JVD Cardiac: RRR, no murmurs, rubs, or gallops.  Respiratory: Clear to auscultation bilaterally. GI: Soft, nontender, non-distended  MS: No edema; No deformity. Neuro:  Nonfocal  Psych: Normal affect   Labs    Chemistry Recent Labs  Lab 12/31/17 1400 12/31/17 2315  NA 138 138  K 3.7 4.0  CL 104 104  CO2 26 26  GLUCOSE 149* 112*  BUN 16 16  CREATININE 1.15 1.13  CALCIUM 8.9 9.5  PROT  --  6.6  ALBUMIN  --  3.7    AST  --  23  ALT  --  18  ALKPHOS  --  74  BILITOT  --  0.3  GFRNONAA 60* >60  GFRAA >60 >60  ANIONGAP 8 8     Hematology Recent Labs  Lab 12/31/17 1400  WBC 6.1  RBC 4.57  HGB 12.1*  HCT 38.3*  MCV 83.8  MCH 26.5  MCHC 31.6  RDW 18.5*  PLT 174    Cardiac Enzymes Recent Labs  Lab 12/31/17 1400 12/31/17 2315  TROPONINI 0.42* 0.48*   No results for input(s): TROPIPOC in the last 168 hours.   BNP Recent Labs  Lab 12/31/17 1358  BNP 210.0*     DDimer No results for input(s): DDIMER in the last 168 hours.   Radiology    No results found.  Cardiac Studies   Echo 10/31/17: Study Conclusions  - Left ventricle: The cavity size was normal. Wall thickness was   increased in a pattern of mild concentric and moderate basal   septal LVH. Systolic function was vigorous. The estimated   ejection fraction was in the range of 70% to 75%.  Doppler   parameters are consistent with abnormal left ventricular   relaxation (grade 1 diastolic dysfunction). Doppler parameters   are consistent with high ventricular filling pressure. - Aortic valve: Mildly calcified annulus. Trileaflet. There was   trivial regurgitation. - Aorta: Aortic root dimension: 38 mm (ED). - Mitral valve: Mildly calcified annulus. Normal thickness leaflets   . - Pulmonic valve: There was mild regurgitation. - Pericardium, extracardiac: A small circumferential pericardial   effusion was identified.  Patient Profile     77 y.o. 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 Presents with new onset chest pain and elevated troponin.  Assessment & Plan    1. NSTEMI. Troponin 0.42>0.48. Initial Ecg showed subtle ST depression. Suspect ACS although could be demand ischemia in setting of uncontrolled HTN. Doubt recurrent pericardial disease. On IV Heparin and Ntg. Repeat Echo. Plan cardiac cath  today. On ASA and high dose statin. Optimize BP control 2. History of idiopathic and recurrent pericarditis s/p window in 2008.  3. Hypertensive urgency. On maximal tolerated beta blocker dose. Lotensin increased to 40 mg daily. Diuretic on hold for cath. Would then resume chlorthalidone. On IV Ntg. Apparently had edema on amlodipine. Potentially add hydralazine.  4. History of SVT/PAF- well controlled on tambecor. If he has significant CAD will need to reconsider antiarrhythmic therapy since flecainide contraindicated in setting of CAD. [place] vasc score of 3. May need to readdress anticoagulation.   For questions or updates, please contact [employer] Please consult [email or web address] for contact info under        Signed, Peter Martinique, MD  01/01/2018, 8:06 AM

## 2018-01-01 NOTE — Progress Notes (Signed)
ANTICOAGULATION CONSULT NOTE - Follow Up Consult  Pharmacy Consult for heparin Indication: NSTEMI/angina  Labs: Recent Labs    12/31/17 1400 12/31/17 2315  HGB 12.1*  --   HCT 38.3*  --   PLT 174  --   APTT  --  52*  LABPROT  --  13.8  INR  --  1.07  HEPARINUNFRC  --  0.12*  CREATININE 1.15  --   TROPONINI 0.42*  --     Assessment: 77yo male subtherapeutic on heparin with initial dosing for CP/ACS.  Goal of Therapy:  Heparin level 0.3-0.7 units/ml   Plan:  Will rebolus with heparin 3000 units and increase heparin gtt by 3 units/kg/hr to 1300 units/hr and check level in 8 hours.    Wynona Neat, PharmD, BCPS  01/01/2018,12:08 AM

## 2018-01-01 NOTE — Care Management (Signed)
#   Clay @ [facility] [facility] # [phone]  BRILINTA   90 MG BID COVER- YES CO-PAY- $ 47.00 TIER- 3 DRUG PRIOR APPROVAL- NO  PREFERRED PHARMACY  : [facility]

## 2018-01-01 NOTE — H&P (View-Only) (Signed)
Progress Note  Patient Name: [name] [name] Date of Encounter: 01/01/2018  Primary Cardiologist: Rozann Lesches, MD   Subjective   Feels well this am. Chest pain resolved. No dyspnea.  Inpatient Medications    Scheduled Meds: . [START ON 01/02/2018] aspirin EC  81 mg Oral Daily  . atorvastatin  80 mg Oral q1800  . benazepril  40 mg Oral Daily  . ferrous sulfate  325 mg Oral BID WC  . metoprolol succinate  50 mg Oral Daily  . pantoprazole  40 mg Oral BID  . sodium chloride flush  3 mL Intravenous Q12H   Continuous Infusions: . sodium chloride    . sodium chloride 1 mL/kg/hr (01/01/18 0733)  . heparin 1,300 Units/hr (01/01/18 0729)  . nitroGLYCERIN 20 mcg/min (12/31/17 2313)   PRN Meds: sodium chloride, acetaminophen, nitroGLYCERIN, ondansetron (ZOFRAN) IV, sodium chloride flush   Vital Signs    Vitals:   01/01/18 0425 01/01/18 0455 01/01/18 0526 01/01/18 0555  BP: 140/60 (!) 156/64 (!) 160/70 (!) 148/109  Pulse:   (!) 50   Resp:      Temp:   98.3 F (36.8 C)   TempSrc:   Oral   SpO2:   95%   Weight:   99.3 kg   Height:        Intake/Output Summary (Last 24 hours) at 01/01/2018 0806 Last data filed at 01/01/2018 0731 Gross per 24 hour  Intake 902.87 ml  Output 840 ml  Net 62.87 ml   Filed Weights   12/31/17 1315 12/31/17 1827 01/01/18 0526  Weight: 103.1 kg 99.3 kg 99.3 kg    Telemetry    Sinus brady - Personally Reviewed  ECG    Sinus brady, LAD.  - Personally Reviewed  Physical Exam   GEN: No acute distress.   Neck: No JVD Cardiac: RRR, no murmurs, rubs, or gallops.  Respiratory: Clear to auscultation bilaterally. GI: Soft, nontender, non-distended  MS: No edema; No deformity. Neuro:  Nonfocal  Psych: Normal affect   Labs    Chemistry Recent Labs  Lab 12/31/17 1400 12/31/17 2315  NA 138 138  K 3.7 4.0  CL 104 104  CO2 26 26  GLUCOSE 149* 112*  BUN 16 16  CREATININE 1.15 1.13  CALCIUM 8.9 9.5  PROT  --  6.6  ALBUMIN  --  3.7    AST  --  23  ALT  --  18  ALKPHOS  --  74  BILITOT  --  0.3  GFRNONAA 60* >60  GFRAA >60 >60  ANIONGAP 8 8     Hematology Recent Labs  Lab 12/31/17 1400  WBC 6.1  RBC 4.57  HGB 12.1*  HCT 38.3*  MCV 83.8  MCH 26.5  MCHC 31.6  RDW 18.5*  PLT 174    Cardiac Enzymes Recent Labs  Lab 12/31/17 1400 12/31/17 2315  TROPONINI 0.42* 0.48*   No results for input(s): TROPIPOC in the last 168 hours.   BNP Recent Labs  Lab 12/31/17 1358  BNP 210.0*     DDimer No results for input(s): DDIMER in the last 168 hours.   Radiology    No results found.  Cardiac Studies   Echo 10/31/17: Study Conclusions  - Left ventricle: The cavity size was normal. Wall thickness was   increased in a pattern of mild concentric and moderate basal   septal LVH. Systolic function was vigorous. The estimated   ejection fraction was in the range of 70% to 75%.  Doppler   parameters are consistent with abnormal left ventricular   relaxation (grade 1 diastolic dysfunction). Doppler parameters   are consistent with high ventricular filling pressure. - Aortic valve: Mildly calcified annulus. Trileaflet. There was   trivial regurgitation. - Aorta: Aortic root dimension: 38 mm (ED). - Mitral valve: Mildly calcified annulus. Normal thickness leaflets   . - Pulmonic valve: There was mild regurgitation. - Pericardium, extracardiac: A small circumferential pericardial   effusion was identified.  Patient Profile     77 y.o. 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 Presents with new onset chest pain and elevated troponin.  Assessment & Plan    1. NSTEMI. Troponin 0.42>0.48. Initial Ecg showed subtle ST depression. Suspect ACS although could be demand ischemia in setting of uncontrolled HTN. Doubt recurrent pericardial disease. On IV Heparin and Ntg. Repeat Echo. Plan cardiac cath  today. On ASA and high dose statin. Optimize BP control 2. History of idiopathic and recurrent pericarditis s/p window in 2008.  3. Hypertensive urgency. On maximal tolerated beta blocker dose. Lotensin increased to 40 mg daily. Diuretic on hold for cath. Would then resume chlorthalidone. On IV Ntg. Apparently had edema on amlodipine. Potentially add hydralazine.  4. History of SVT/PAF- well controlled on tambecor. If he has significant CAD will need to reconsider antiarrhythmic therapy since flecainide contraindicated in setting of CAD. [place] vasc score of 3. May need to readdress anticoagulation.   For questions or updates, please contact [place] Please consult [email or web address] for contact info under        Signed, Peter Martinique, MD  01/01/2018, 8:06 AM

## 2018-01-01 NOTE — Progress Notes (Signed)
Inpatient Diabetes Program Recommendations  AACE/ADA: New Consensus Statement on Inpatient Glycemic Control (2015)  Target Ranges:  Prepandial:   less than 140 mg/dL      Peak postprandial:   less than 180 mg/dL (1-2 hours)      Critically ill patients:  140 - 180 mg/dL   Lab Results  Component Value Date   GLUCAP 132 (H) 01/01/2014   HGBA1C 7.5 (H) 12/31/2017    Review of Glycemic Control  Diabetes history: None  Inpatient Diabetes Program Recommendations:    A1c this admission 7.5%, meeting ADA criteria for New DM diagnosis. If patient is to be diagnosed this admission, please place consult for DM coordinator for lifestyle modification education to begin. Patient would benefit from Metformin 500 mg BID at time of d/c.  Thanks,  Tama Headings RN, MSN, BC-ADM Inpatient Diabetes Coordinator Team Pager [phone] (8a-5p)

## 2018-01-01 NOTE — Progress Notes (Signed)
Troponin level 0.48, cardiology on call paged

## 2018-01-01 NOTE — Research (Signed)
XIENCE 28 Research study: Spoke with patient and family about participating in the XIENCE 28 research study> ICF left for review questions encouraged and answered. Research department will follow up tomorrow.

## 2018-01-01 NOTE — Progress Notes (Signed)
[place] for heparin  Indication: chest pain/ACS  Allergies  Allergen Reactions  . Amiodarone     rash    Patient Measurements: Height: 6' (182.9 cm) Weight: 218 lb 14.4 oz (99.3 kg) IBW/kg (Calculated) : 77.6   Vital Signs: Temp: 98 F (36.7 C) (10/08 0816) Temp Source: Oral (10/08 0816) BP: 179/74 (10/08 0816) Pulse Rate: 54 (10/08 0920)  Labs: Recent Labs    12/31/17 1400 12/31/17 2315 01/01/18 0626 01/01/18 0842  HGB 12.1*  --   --   --   HCT 38.3*  --   --   --   PLT 174  --   --   --   APTT  --  52*  --   --   LABPROT  --  13.8  --   --   INR  --  1.07  --   --   HEPARINUNFRC  --  0.12*  --  0.44  CREATININE 1.15 1.13 0.87  --   TROPONINI 0.42* 0.48* 0.36* 0.38*    Estimated Creatinine Clearance: 86.8 mL/min (by C-G formula based on SCr of 0.87 mg/dL).   Assessment: 77 yo male with NSTEMI on heparin. Plans for cath today -heparin level is at goal on 1300 units/hr  Goal of Therapy:  Heparin level 0.3-0.7 units/ml Monitor platelets by anticoagulation protocol: Yes   Plan:  -no heparin changes needed -Will follow plans post cath  Hildred Laser, PharmD Clinical Pharmacist Please check Amion for pharmacy contact number

## 2018-01-01 NOTE — Interval H&P Note (Signed)
Cath Lab Visit (complete for each Cath Lab visit)  Clinical Evaluation Leading to the Procedure:   ACS: Yes.    Non-ACS:    Anginal Classification: CCS IV  Anti-ischemic medical therapy: Minimal Therapy (1 class of medications)  Non-Invasive Test Results: No non-invasive testing performed  Prior CABG: No previous CABG      History and Physical Interval Note:  01/01/2018 1:16 PM  [name] [name]  has presented today for surgery, with the diagnosis of NSTEMI  The various methods of treatment have been discussed with the patient and family. After consideration of risks, benefits and other options for treatment, the patient has consented to  Procedure(s): LEFT HEART CATH AND CORONARY ANGIOGRAPHY (N/A) as a surgical intervention .  The patient's history has been reviewed, patient examined, no change in status, stable for surgery.  I have reviewed the patient's chart and labs.  Questions were answered to the patient's satisfaction.     Larae Grooms

## 2018-01-02 ENCOUNTER — Encounter (HOSPITAL_COMMUNITY): Payer: Self-pay | Admitting: Interventional Cardiology

## 2018-01-02 ENCOUNTER — Inpatient Hospital Stay (HOSPITAL_COMMUNITY): Payer: Medicare HMO

## 2018-01-02 DIAGNOSIS — I5030 Unspecified diastolic (congestive) heart failure: Secondary | ICD-10-CM

## 2018-01-02 DIAGNOSIS — E7800 Pure hypercholesterolemia, unspecified: Secondary | ICD-10-CM

## 2018-01-02 DIAGNOSIS — E782 Mixed hyperlipidemia: Secondary | ICD-10-CM

## 2018-01-02 DIAGNOSIS — E785 Hyperlipidemia, unspecified: Secondary | ICD-10-CM

## 2018-01-02 LAB — CBC
HEMATOCRIT: 36.3 % — AB (ref 39.0–52.0)
Hemoglobin: 11.1 g/dL — ABNORMAL LOW (ref 13.0–17.0)
MCH: 25.5 pg — ABNORMAL LOW (ref 26.0–34.0)
MCHC: 30.6 g/dL (ref 30.0–36.0)
MCV: 83.3 fL (ref 80.0–100.0)
NRBC: 0 % (ref 0.0–0.2)
PLATELETS: 182 10*3/uL (ref 150–400)
RBC: 4.36 MIL/uL (ref 4.22–5.81)
RDW: 18.7 % — ABNORMAL HIGH (ref 11.5–15.5)
WBC: 6.7 10*3/uL (ref 4.0–10.5)

## 2018-01-02 LAB — BASIC METABOLIC PANEL
ANION GAP: 8 (ref 5–15)
BUN: 13 mg/dL (ref 8–23)
CHLORIDE: 106 mmol/L (ref 98–111)
CO2: 22 mmol/L (ref 22–32)
Calcium: 8.7 mg/dL — ABNORMAL LOW (ref 8.9–10.3)
Creatinine, Ser: 0.84 mg/dL (ref 0.61–1.24)
GFR calc non Af Amer: >60 mL/min (ref >60)
Glucose, Bld: 113 mg/dL — ABNORMAL HIGH (ref 70–99)
Potassium: 3.4 mmol/L — ABNORMAL LOW (ref 3.5–5.1)
Sodium: 136 mmol/L (ref 135–145)

## 2018-01-02 LAB — ECHOCARDIOGRAM COMPLETE
HEIGHTINCHES: 72 in
Weight: 3552.05 oz

## 2018-01-02 MED ORDER — LIVING WELL WITH DIABETES BOOK
Freq: Once | Status: AC
  Administered 2018-01-02: 13:00:00 1
  Filled 2018-01-02: qty 1

## 2018-01-02 MED ORDER — BENAZEPRIL HCL 40 MG PO TABS: 20.0000 mg | ORAL_TABLET | Freq: Every day | ORAL | 1 refills | Status: DC

## 2018-01-02 MED ORDER — POTASSIUM CHLORIDE CRYS ER 20 MEQ PO TBCR
40.0000 meq | EXTENDED_RELEASE_TABLET | Freq: Once | ORAL | Status: AC
  Administered 2018-01-02: 11:00:00 40 meq via ORAL
  Filled 2018-01-02: qty 2

## 2018-01-02 MED ORDER — MAGNESIUM HYDROXIDE 400 MG/5ML PO SUSP
15.0000 mL | Freq: Every day | ORAL | Status: DC | PRN
  Administered 2018-01-03: 04:00:00 15 mL via ORAL
  Filled 2018-01-02: qty 30

## 2018-01-02 MED ORDER — HYDRALAZINE HCL 20 MG/ML IJ SOLN
10.0000 mg | Freq: Four times a day (QID) | INTRAMUSCULAR | Status: DC | PRN
  Administered 2018-01-02: 18:00:00 10 mg via INTRAVENOUS
  Filled 2018-01-02: qty 1

## 2018-01-02 MED ORDER — HYDROCHLOROTHIAZIDE 12.5 MG PO CAPS
12.5000 mg | ORAL_CAPSULE | Freq: Every day | ORAL | Status: DC
  Administered 2018-01-02 – 2018-01-03 (×2): 12.5 mg via ORAL
  Filled 2018-01-02 (×2): qty 1

## 2018-01-02 MED ORDER — HYDRALAZINE HCL 50 MG PO TABS
50.0000 mg | ORAL_TABLET | Freq: Three times a day (TID) | ORAL | Status: DC
  Administered 2018-01-02 – 2018-01-03 (×2): 50 mg via ORAL
  Filled 2018-01-02 (×2): qty 1

## 2018-01-02 MED ORDER — ATORVASTATIN CALCIUM 80 MG PO TABS: 80.0000 mg | ORAL_TABLET | Freq: Every day | ORAL | 1 refills | Status: DC

## 2018-01-02 MED ORDER — NITROGLYCERIN 0.4 MG SL SUBL: 0.4000 mg | SUBLINGUAL_TABLET | SUBLINGUAL | 1 refills | Status: DC | PRN

## 2018-01-02 MED ORDER — TICAGRELOR 90 MG PO TABS: 90.0000 mg | ORAL_TABLET | Freq: Two times a day (BID) | ORAL | 6 refills | Status: DC

## 2018-01-02 MED ORDER — HYDRALAZINE HCL 25 MG PO TABS: 25.0000 mg | ORAL_TABLET | Freq: Two times a day (BID) | ORAL | 1 refills | Status: DC

## 2018-01-02 MED ORDER — SALINE SPRAY 0.65 % NA SOLN
1.0000 | NASAL | Status: DC | PRN
  Administered 2018-01-03: 1 via NASAL
  Filled 2018-01-02: qty 44

## 2018-01-02 MED ORDER — ANGIOPLASTY BOOK
Freq: Once | Status: AC
  Administered 2018-01-03: 04:00:00
  Filled 2018-01-02: qty 1

## 2018-01-02 MED ORDER — HYDRALAZINE HCL 50 MG PO TABS: 50.0000 mg | ORAL_TABLET | Freq: Three times a day (TID) | ORAL | Status: DC

## 2018-01-02 MED ORDER — HYDRALAZINE HCL 50 MG PO TABS
50.0000 mg | ORAL_TABLET | Freq: Once | ORAL | Status: AC
  Administered 2018-01-02: 14:00:00 50 mg via ORAL
  Filled 2018-01-02: qty 1

## 2018-01-02 MED FILL — Nitroglycerin IV Soln 100 MCG/ML in D5W: INTRA_ARTERIAL | Qty: 10 | Status: AC

## 2018-01-02 MED FILL — NITROGLYCERIN 0.4 MG TAB SL: 0.4 | 25 days supply | Qty: 25 | Fill #0 | Status: TO

## 2018-01-02 MED FILL — BRILINTA 90 MG TABLET: 90 | 30 days supply | Qty: 60 | Fill #0 | Status: TO

## 2018-01-02 MED FILL — BENAZEPRIL HCL 40 MG TABLET: 40 | 60 days supply | Qty: 30 | Fill #0 | Status: TO

## 2018-01-02 MED FILL — ATORVASTATIN CALCIUM 80 MG: 80 | 30 days supply | Qty: 30 | Fill #0 | Status: TO

## 2018-01-02 NOTE — Progress Notes (Signed)
MD states patient to stay overnight for observation due to elevated blood pressure.  See NO.  Patient not symptomatic.

## 2018-01-02 NOTE — Progress Notes (Signed)
Inpatient Diabetes Program Recommendations  AACE/ADA: New Consensus Statement on Inpatient Glycemic Control (2015)  Target Ranges:  Prepandial:   less than 140 mg/dL      Peak postprandial:   less than 180 mg/dL (1-2 hours)      Critically ill patients:  140 - 180 mg/dL   Spoke with patient and wife about new diabetes diagnosis.  PAteint reports his PCP has been getting his to check his glucose at home for a little over a year now. Discussed A1C results (7.5% this admission) and explained what an A1C is. Discussed basic pathophysiology of DM Type 2, basic home care, importance of checking CBGs and maintaining good CBG control to prevent long-term and short-term complications. Patient reports one of hid parents had and his sister has DM. Reviewed glucose and A1C goals. Discussed impact of nutrition, exercise, stress, sickness, and medications on diabetes control. Ordered Living Well with diabetes booklet and encouraged patient to read through entire book. Discussed diet modifications and exercise regimen. Patient reviewed examples of meals and portions sizes that he currently eats in addition to beverages that he consumes. Patient follows a balanced healthy DM diet already. Patient also reports walking 12 miles a week.    Encouraged patient to continue to check his glucose. Also brought up he may need to be placed on DM medications. Described to patient to possibly walk after a large meal to utilize extra glucose.  Patient and wife verbalized understanding of information discussed and had questions about diet. Will follow closely with his PCP.   Thanks, Tama Headings RN, MSN, BC-ADM Inpatient Diabetes Coordinator Team Pager [phone] (8a-5p)

## 2018-01-02 NOTE — Progress Notes (Signed)
CARDIAC REHAB PHASE I   PRE:  Rate/Rhythm: 70 SR    BP: sitting 179/81    SaO2:   MODE:  Ambulation: 450 ft   POST:  Rate/Rhythm: 84 SR    BP: sitting 174/60     SaO2:   Pt stiff initially. Used handheld assist for most of walk as he has feet problems and is better in his shoes. Steadier toward end of walk and was able to walk independently.  BP elevated before and after. No angina. Ed completed with pt and wife. Good reception. Understands the importance of Brilinta and it sounds like they are planning to do the study. Informed pt of DM and discussed diet. He is good about walking 3 miles a day and we discussed this. Will refer to [facility]. Discussed NTG as well. Good reception. [id number]  Garrett, ACSM 01/02/2018 9:38 AM

## 2018-01-02 NOTE — Discharge Summary (Signed)
Discharge Summary    Patient ID: [name] [name],  MRN: [id number], DOB/AGE: 77-Apr-1942 77 y.o.  Admit date: 12/31/2017 Discharge date: 01/02/2018  Primary Care Provider: Caryl Bis Primary Cardiologist: Dr. Domenic Polite   Discharge Diagnoses    Active Problems:   NSTEMI (non-ST elevated myocardial infarction) [facility])   Hypertensive urgency   Hyperlipidemia   Allergies Allergies  Allergen Reactions  . Amiodarone     rash    Diagnostic Studies/Procedures    Cath: 01/01/18   Mid Cx lesion is 95% stenosed.  Prox LAD lesion is 25% stenosed.  Mid LAD lesion is 30% stenosed.  Prox RCA to Mid RCA lesion is 50% stenosed.  The left ventricular systolic function is normal.  LV end diastolic pressure is normal. LVEDP 12 mm Hg.  The left ventricular ejection fraction is 50-55% by visual estimate.  There is no aortic valve stenosis.  Severe tortuosity in the right subclavian which makes torquing catheters difficult. Would consider femoral approach in the future, particularly if emergent cardiac cath was needed.  A drug-eluting stent was successfully placed using a STENT SIERRA 3.25 X 15 MM.  Post intervention, there is a 0% residual stenosis.   Recommend uninterrupted dual antiplatelet therapy with Aspirin 81mg  daily and Ticagrelor 90mg  twice daily for a minimum of 12 months (ACS - Class I recommendation).   Continue aggressive secondary prevention.  IV Cangrelor for 2 hours.  Cangrelor was used in the event there was any bleeding issue in the forearm, it could be turned off and would wear off quickly. 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  He recently started having exertional chest pain.  Patient stated that usually he walks 4 miles 3 times per week but recently had not been able to do this  because he was developing chest pain when he exerts himself.  He reported his chest pain comes on with very minimal exertion.  Had an episode while in church and then again later in the evening that occurred while he was laying down and lasted for an hour.  He said the pain over the past 24 to 48 hours had been coming and going even at night and was able to sleep because of the pain.  He has noted some dyspnea on exertion as well.   He also had some problems recently with poorly controlled hypertension with systolic blood pressure as high as 200 mmHg.  Due to lower extremity edema his amlodipine was stopped and he was started on HCTZ by his family doctor.  Last 2D echocardiogram in August 2019 showed EF of 70 to 75% with grade 1 diastolic dysfunction and a small pericardial effusion.  ER troponin was mildly elevated at 0.42 and he was transferred to [facility] for further evaluation with cardiac catheterization.   Hospital Course     Underwent cardiac cath noted above with PCI/DESx1 to the mLcx with residual disease in the RCA (50%), and mLAD (30%). Plan for DAPT with ASA/Brilinta for at least one year. He was continued on Cangrelor for 2 hours post cath. Added high dose statin. LDL noted at 93. He was noted to be hypertensive this admission. Continued on BB and ACEI, added back his HCTZ post cath. Also added hydralazine 25mg  BID prior to discharge.  Instructed to follow up his blood pressures as an outpatient.  Reports his PCP has been following his blood pressures and adjusting medications. Hgb A1c noted at 7.5, he was instructed to follow up closely with his PCP regarding further management. Worked well with cardiac rehab without recurrent chest pain. Patient agreed to enroll in Xience study prior to discharge, and will be followed up in the clinic with Dr. Lia Foyer.   General: Well developed, well nourished, male appearing in no acute distress. Head: Normocephalic, atraumatic.  Neck: Supple  without bruits, JVD. Lungs:  Resp regular and unlabored, CTA. Heart: RRR, S1, S2, no S3, S4, or murmur; no rub. Abdomen: Soft, non-tender, non-distended with normoactive bowel sounds. No hepatomegaly. No rebound/guarding. No obvious abdominal masses. Extremities: No clubbing, cyanosis, edema. Distal pedal pulses are 2+ bilaterally. R radial cath site stable without bruising or hematoma Neuro: Alert and oriented X 3. Moves all extremities spontaneously. Psych: Normal affect.  [name] [name] was seen by Dr. Martinique and determined stable for discharge home. Follow up in the office has been arranged. Medications are listed below.   _____________  Discharge Vitals Blood pressure (!) 164/66, pulse 67, temperature 98.2 F (36.8 C), resp. rate (!) 22, height 6' (1.829 m), weight 100.7 kg, SpO2 97 %.  Filed Weights   12/31/17 1827 01/01/18 0526 01/02/18 0605  Weight: 99.3 kg 99.3 kg 100.7 kg    Labs & Radiologic Studies    CBC Recent Labs    12/31/17 1400 01/02/18 0223  WBC 6.1 6.7  NEUTROABS 4.0  --   HGB 12.1* 11.1*  HCT 38.3* 36.3*  MCV 83.8 83.3  PLT 174 481   Basic Metabolic Panel Recent Labs    12/31/17 2315 01/01/18 0626 01/02/18 0223  NA 138 135 136  K 4.0 4.0 3.4*  CL 104 106 106  CO2 26 22 22   GLUCOSE 112* 131* 113*  BUN 16 17 13   CREATININE 1.13 0.87 0.84  CALCIUM 9.5 8.7* 8.7*  MG 2.1  --   --    Liver Function Tests Recent Labs    12/31/17 2315  AST 23  ALT 18  ALKPHOS 74  BILITOT 0.3  PROT 6.6  ALBUMIN 3.7   No results for input(s): LIPASE, AMYLASE in the last 72 hours. Cardiac Enzymes Recent Labs    12/31/17 2315 01/01/18 0626 01/01/18 0842  TROPONINI 0.48* 0.36* 0.38*   BNP Invalid input(s): POCBNP D-Dimer No results for input(s): DDIMER in the last 72 hours. Hemoglobin A1C Recent Labs    12/31/17 2315  HGBA1C 7.5*   Fasting Lipid Panel Recent Labs    01/01/18 0626  CHOL 142  HDL 34*  LDLCALC 93  TRIG 73  CHOLHDL 4.2    Thyroid Function Tests Recent Labs    12/31/17 2315  TSH 5.639*   _____________  No results found. Disposition   Pt is being discharged home today in good condition.  Follow-up Plans & Appointments    Follow-up Information    Satira Sark, MD Follow up on 01/18/2018.   Specialty:  Cardiology Why:  at 10am for follow up appt.  Contact information: [place] [phone]          Discharge Instructions    Amb Referral to Cardiac Rehabilitation   Complete by:  As directed    Diagnosis:   Coronary Stents PTCA NSTEMI     Call MD for:  redness, tenderness, or signs of infection (pain, swelling, redness, odor or green/yellow discharge around incision site)   Complete by:  As directed    Diet - low sodium heart healthy   Complete by:  As directed    Discharge instructions   Complete by:  As directed    Radial Site Care Refer to this sheet in the next few weeks. These instructions provide you with information on caring for yourself after your procedure. Your caregiver may also give you more specific instructions. Your treatment has been planned according to current medical practices, but problems sometimes occur. Call your caregiver if you have any problems or questions after your procedure. HOME CARE INSTRUCTIONS You may shower the day after the procedure.Remove the bandage (dressing) and gently wash the site with plain soap and water.Gently pat the site dry.  Do not apply powder or lotion to the site.  Do not submerge the affected site in water for 3 to 5 days.  Inspect the site at least twice daily.  Do not flex or bend the affected arm for 24 hours.  No lifting over 5 pounds (2.3 kg) for 5 days after your procedure.  Do not drive home if you are discharged the same day of the procedure. Have someone else drive you.  You may drive 24 hours after the procedure unless otherwise instructed by your caregiver.  What to expect: Any  bruising will usually fade within 1 to 2 weeks.  Blood that collects in the tissue (hematoma) may be painful to the touch. It should usually decrease in size and tenderness within 1 to 2 weeks.  SEEK IMMEDIATE MEDICAL CARE IF: You have unusual pain at the radial site.  You have redness, warmth, swelling, or pain at the radial site.  You have drainage (other than a small amount of blood on the dressing).  You have chills.  You have a fever or persistent symptoms for more than 72 hours.  You have a fever and your symptoms suddenly get worse.  Your arm becomes pale, cool, tingly, or numb.  You have heavy bleeding from the site. Hold pressure on the site.   PLEASE DO NOT MISS ANY DOSES OF YOUR BRILINTA!!!!! Also keep a log of you blood pressures and bring back to your follow up appt. Please call the office with any questions.   Patients taking blood thinners should generally stay away from medicines like ibuprofen, Advil, Motrin, naproxen, and Aleve due to risk of stomach bleeding. You may take Tylenol as directed or talk to your primary doctor about alternatives.   Increase activity slowly   Complete by:  As directed        Discharge Medications     Medication List    STOP taking these medications   flecainide 50 MG tablet Commonly known as:  TAMBOCOR     TAKE these medications   allopurinol 300 MG tablet Commonly known as:  ZYLOPRIM Take 450 mg by mouth daily.   aspirin 81 MG tablet Take 81 mg by mouth daily.   atorvastatin 80 MG tablet Commonly known as:  LIPITOR Take 1 tablet (80 mg total) by mouth daily at 6 PM.   benazepril 40 MG tablet Commonly known as:  LOTENSIN Take 0.5 tablets (20 mg total) by mouth daily. What changed:  medication strength   ferrous sulfate 325 (65 FE) MG tablet Take 325 mg by mouth 2 (two) times daily.   hydrALAZINE 25 MG tablet Commonly known as:  APRESOLINE Take 1 tablet (25 mg total) by mouth 2 (two) times daily.     hydrochlorothiazide 12.5 MG tablet Commonly known  as:  HYDRODIURIL Take 12.5 mg by mouth daily. for high blood pressure   metoprolol succinate 50 MG 24 hr tablet Commonly known as:  TOPROL-XL TAKE 1 TABLET BY MOUTH ONCE DAILY WITH FOOD OR  IMMEDIATELY  FOLLOWING  A  MEAL   multivitamin tablet Take 1 tablet by mouth daily.   nitroGLYCERIN 0.4 MG SL tablet Commonly known as:  NITROSTAT Place 1 tablet (0.4 mg total) under the tongue every 5 (five) minutes x 3 doses as needed for chest pain.   pantoprazole 40 MG tablet Commonly known as:  PROTONIX TAKE 1 TABLET BY MOUTH ONCE DAILY What changed:  when to take this   ticagrelor 90 MG Tabs tablet Commonly known as:  BRILINTA Take 1 tablet (90 mg total) by mouth 2 (two) times daily.   vitamin C 500 MG tablet Commonly known as:  ASCORBIC ACID Take 500 mg by mouth daily.        Acute coronary syndrome (MI, NSTEMI, STEMI, etc) this admission?: Yes.     AHA/ACC Clinical Performance & Quality Measures: 1. Aspirin prescribed? - Yes 2. ADP Receptor Inhibitor (Plavix/Clopidogrel, Brilinta/Ticagrelor or Effient/Prasugrel) prescribed (includes medically managed patients)? - Yes 3. Beta Blocker prescribed? - Yes 4. High Intensity Statin (Lipitor 40-80mg  or Crestor 20-40mg ) prescribed? - Yes 5. EF assessed during THIS hospitalization? - Yes 6. For EF <40%, was ACEI/ARB prescribed? - Not Applicable (EF >/= 35%) 7. For EF <40%, Aldosterone Antagonist (Spironolactone or Eplerenone) prescribed? - Not Applicable (EF >/= 00%) 8. Cardiac Rehab Phase II ordered (Included Medically managed Patients)? - Yes      Outstanding Labs/Studies   FLP/LFTs in 6 weeks.   Duration of Discharge Encounter   Greater than 30 minutes including physician time.  Signed, Reino Bellis NP-C 01/02/2018, 1:03 PM

## 2018-01-02 NOTE — Progress Notes (Signed)
Patient continues with elevated blood pressure despite administering ordered dose of hydralazine at 1358 as per MD.  Echocardiogram done as requested.  Dr. Martinique notified of elevated blood pressure.

## 2018-01-02 NOTE — Progress Notes (Signed)
2D Echocardiogram has been performed.  [name] [name] 01/02/2018, 3:01 PM

## 2018-01-02 NOTE — Progress Notes (Signed)
TR BAND REMOVAL  LOCATION:    right radial  DEFLATED PER PROTOCOL:    Yes.    TIME BAND OFF / DRESSING APPLIED:    2300   SITE UPON ARRIVAL:    Level 0  SITE AFTER BAND REMOVAL:    Level 1  CIRCULATION SENSATION AND MOVEMENT:    Within Normal Limits   Yes.    COMMENTS:  Post TR band instructions given, dsd applied, good cap refill, bruise noted.

## 2018-01-02 NOTE — Research (Addendum)
XIENCE 28 Informed Consent   Subject Name: BRIX BREARLEY  Subject met inclusion and exclusion criteria.  The informed consent form, study requirements and expectations were reviewed with the subject and questions and concerns were addressed prior to the signing of the consent form.  The subject verbalized understanding of the trail requirements.  The subject agreed to participate in the XIENCE 28 trial and signed the informed consent.  The informed consent was obtained prior to performance of any protocol-specific procedures for the subject.  A copy of the signed informed consent was given to the subject and a copy was placed in the subject's medical record.  Hedrick,Tamilyn Lupien W 01/02/2018, 1100

## 2018-01-02 NOTE — Care Management Note (Signed)
Case Management Note  Patient Details  Name: [name] [name] MRN: [id number] Date of Birth: 14-Feb-1941  Subjective/Objective:  From home with spouse, s/p stent intervention, will be on brilinta, NCM spoke with patient and wife, informed them of the co pay amt for brilinta.  RN will give them the brilinta coupon for the 30 days free.  Also wife states patient may only  Be on brilinta for 30 days, they are not sure.                    Action/Plan: DC home when ready.   Expected Discharge Date:                  Expected Discharge Plan:  Home/Self Care  In-House Referral:     Discharge planning Services  CM Consult, Medication Assistance  Post Acute Care Choice:    Choice offered to:     DME Arranged:    DME Agency:     HH Arranged:    HH Agency:     Status of Service:  Completed, signed off  If discussed at [employer] of Stay Meetings, dates discussed:    Additional Comments:  Zenon Mayo, RN 01/02/2018, 11:33 AM

## 2018-01-03 ENCOUNTER — Telehealth: Payer: Self-pay | Admitting: Interventional Cardiology

## 2018-01-03 ENCOUNTER — Telehealth: Payer: Self-pay

## 2018-01-03 DIAGNOSIS — Z955 Presence of coronary angioplasty implant and graft: Secondary | ICD-10-CM

## 2018-01-03 MED ORDER — HYDRALAZINE HCL 50 MG PO TABS: 50.0000 mg | ORAL_TABLET | Freq: Three times a day (TID) | ORAL | 1 refills | Status: DC

## 2018-01-03 NOTE — Progress Notes (Signed)
CARDIAC REHAB PHASE I   PRE:  Rate/Rhythm: 68 SR    BP: sitting 138/68    SaO2:   MODE:  Ambulation: 1000 ft   POST:  Rate/Rhythm: 86 SR    BP: sitting 162/59     SaO2:   Tolerated well, no c/o. Increased distance. BP more controlled. No more questions.  Wing, ACSM 01/03/2018 8:35 AM

## 2018-01-03 NOTE — Discharge Summary (Signed)
Discharge Summary    Patient ID: [name] [name],  MRN: [id number], DOB/AGE: Nov 14, 1940 77 y.o.  Admit date: 12/31/2017 Discharge date: 01/03/2018  Primary Care Provider: Caryl Bis Primary Cardiologist: Dr. Domenic Polite   Discharge Diagnoses    Active Problems:   NSTEMI (non-ST elevated myocardial infarction) [facility])   Hypertensive urgency   Hyperlipidemia   Allergies Allergies  Allergen Reactions  . Amiodarone     rash    Diagnostic Studies/Procedures    Cath: 01/01/18   Mid Cx lesion is 95% stenosed.  Prox LAD lesion is 25% stenosed.  Mid LAD lesion is 30% stenosed.  Prox RCA to Mid RCA lesion is 50% stenosed.  The left ventricular systolic function is normal.  LV end diastolic pressure is normal. LVEDP 12 mm Hg.  The left ventricular ejection fraction is 50-55% by visual estimate.  There is no aortic valve stenosis.  Severe tortuosity in the right subclavian which makes torquing catheters difficult. Would consider femoral approach in the future, particularly if emergent cardiac cath was needed.  A drug-eluting stent was successfully placed using a STENT SIERRA 3.25 X 15 MM.  Post intervention, there is a 0% residual stenosis.  Recommend uninterrupted dual antiplatelet therapy with Aspirin 81mg  daily and Ticagrelor 90mg  twice dailyfor a minimum of 12 months (ACS - Class I recommendation).  Continue aggressive secondary prevention.  IV Cangrelor for 2 hours. Cangrelor was used in the event there was any bleeding issue in the forearm, it could be turned off and would wear off quickly.  TTE: 01/02/18  Study Conclusions  - Left ventricle: The cavity size was normal. There was severe   focal basal hypertrophy. Systolic function was normal. The   estimated ejection fraction was in the range of 55% to 60%. Wall   motion was normal; there were no regional wall motion   abnormalities. There was an increased relative contribution of   atrial  contraction to ventricular filling. Doppler parameters are   consistent with abnormal left ventricular relaxation (grade 1   diastolic dysfunction). - Mitral valve: Calcified annulus. Valve area by pressure   half-time: 1.59 cm^2. - Left atrium: The atrium was mildly dilated. - Pulmonic valve: There was trivial regurgitation. - Pulmonary arteries: Systolic pressure could not be accurately   estimated. - Pericardium, extracardiac: A trivial pericardial effusion was   identified posterior to the heart. 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 He recently started having exertional chest pain. Patient stated that usually he walks 4 miles 3 times per week but recently had not been able to do this because he was developing chest pain when he exerts himself. He reported his chest pain comes on with very minimal exertion. Had an episode while in church and then again later in the evening that occurred while he was laying down and lasted for an hour. He said the pain over the past 24 to 48 hours had been coming and going even at night and was able to sleep because of the pain. He has noted some dyspnea on exertion as well.   He also had some problems recently with poorly controlled hypertension with systolic blood pressure as high as 200 mmHg. Due to lower extremity edema his amlodipine was stopped and he was started on HCTZ by his family doctor. Last 2D echocardiogram in August 2019 showed  EF of 70 to 75% with grade 1 diastolic dysfunction and a small pericardial effusion. ER troponin was mildly elevated at 0.42 and he was transferred to [facility] for further evaluation with cardiac catheterization.   Hospital Course     Underwent cardiac cath noted above with PCI/DESx1 to the mLcx with residual disease in the  RCA (50%), and mLAD (30%). Plan for DAPT with ASA/Brilinta for at least one year. He was continued on Cangrelor for 2 hours post cath. Added high dose statin. LDL noted at 93. He was noted to be hypertensive this admission. Continued on BB and ACEI, added back his HCTZ post cath. Also added hydralazine 50mg  TID prior to discharge. Instructed to follow up his blood pressures as an outpatient. Reports his PCP has been following his blood pressures and adjusting medications. Hgb A1c noted at 7.5, he was instructed to follow up closely with his PCP regarding further management. Worked well with cardiac rehab without recurrent chest pain. Patient agreed to enroll in Xience study prior to discharge, and will be followed up in the clinic with Dr. Lia Foyer.   General: Well developed, well nourished, male appearing in no acute distress. Head: Normocephalic, atraumatic.  Neck: Supple without bruits, JVD. Lungs:  Resp regular and unlabored, CTA. Heart: RRR, S1, S2, no S3, S4, or murmur; no rub. Abdomen: Soft, non-tender, non-distended with normoactive bowel sounds.  Extremities: No clubbing, cyanosis, edema. Distal pedal pulses are 2+ bilaterally. R radial cath site stable without bruising or hematoma Neuro: Alert and oriented X 3. Moves all extremities spontaneously. Psych: Normal affect.  Genevieve Ritzel [name] was seen by Dr. Martinique and determined stable for discharge home. Follow up in the office has been arranged. Medications are listed below.   _____________  Discharge Vitals Blood pressure 138/68, pulse 72, temperature (!) 97.3 F (36.3 C), temperature source Oral, resp. rate 15, height 6' (1.829 m), weight 100.2 kg, SpO2 96 %.  Filed Weights   01/01/18 0526 01/02/18 0605 01/03/18 0536  Weight: 99.3 kg 100.7 kg 100.2 kg    Labs & Radiologic Studies    CBC Recent Labs    12/31/17 1400 01/02/18 0223  WBC 6.1 6.7  NEUTROABS 4.0  --   HGB 12.1* 11.1*  HCT 38.3* 36.3*  MCV 83.8 83.3  PLT 174 628    Basic Metabolic Panel Recent Labs    12/31/17 2315 01/01/18 0626 01/02/18 0223  NA 138 135 136  K 4.0 4.0 3.4*  CL 104 106 106  CO2 26 22 22   GLUCOSE 112* 131* 113*  BUN 16 17 13   CREATININE 1.13 0.87 0.84  CALCIUM 9.5 8.7* 8.7*  MG 2.1  --   --    Liver Function Tests Recent Labs    12/31/17 2315  AST 23  ALT 18  ALKPHOS 74  BILITOT 0.3  PROT 6.6  ALBUMIN 3.7   No results for input(s): LIPASE, AMYLASE in the last 72 hours. Cardiac Enzymes Recent Labs    12/31/17 2315 01/01/18 0626 01/01/18 0842  TROPONINI 0.48* 0.36* 0.38*   BNP Invalid input(s): POCBNP D-Dimer No results for input(s): DDIMER in the last 72 hours. Hemoglobin A1C Recent Labs    12/31/17 2315  HGBA1C 7.5*   Fasting Lipid Panel Recent Labs    01/01/18 0626  CHOL 142  HDL 34*  LDLCALC 93  TRIG 73  CHOLHDL 4.2   Thyroid Function Tests Recent Labs    12/31/17 2315  TSH 5.639*   _____________  No  results found. Disposition   Pt is being discharged home today in good condition.  Follow-up Plans & Appointments    Follow-up Information    Satira Sark, MD Follow up on 01/18/2018.   Specialty:  Cardiology Why:  at 10am for follow up appt.  Contact information: [place] [id number] [phone]          Discharge Instructions    Amb Referral to Cardiac Rehabilitation   Complete by:  As directed    Diagnosis:   Coronary Stents PTCA NSTEMI     Call MD for:  redness, tenderness, or signs of infection (pain, swelling, redness, odor or green/yellow discharge around incision site)   Complete by:  As directed    Diet - low sodium heart healthy   Complete by:  As directed    Discharge instructions   Complete by:  As directed    Radial Site Care Refer to this sheet in the next few weeks. These instructions provide you with information on caring for yourself after your procedure. Your caregiver may also give you more specific instructions. Your  treatment has been planned according to current medical practices, but problems sometimes occur. Call your caregiver if you have any problems or questions after your procedure. HOME CARE INSTRUCTIONS You may shower the day after the procedure.Remove the bandage (dressing) and gently wash the site with plain soap and water.Gently pat the site dry.  Do not apply powder or lotion to the site.  Do not submerge the affected site in water for 3 to 5 days.  Inspect the site at least twice daily.  Do not flex or bend the affected arm for 24 hours.  No lifting over 5 pounds (2.3 kg) for 5 days after your procedure.  Do not drive home if you are discharged the same day of the procedure. Have someone else drive you.  You may drive 24 hours after the procedure unless otherwise instructed by your caregiver.  What to expect: Any bruising will usually fade within 1 to 2 weeks.  Blood that collects in the tissue (hematoma) may be painful to the touch. It should usually decrease in size and tenderness within 1 to 2 weeks.  SEEK IMMEDIATE MEDICAL CARE IF: You have unusual pain at the radial site.  You have redness, warmth, swelling, or pain at the radial site.  You have drainage (other than a small amount of blood on the dressing).  You have chills.  You have a fever or persistent symptoms for more than 72 hours.  You have a fever and your symptoms suddenly get worse.  Your arm becomes pale, cool, tingly, or numb.  You have heavy bleeding from the site. Hold pressure on the site.   PLEASE DO NOT MISS ANY DOSES OF YOUR BRILINTA!!!!! Also keep a log of you blood pressures and bring back to your follow up appt. Please call the office with any questions.   Patients taking blood thinners should generally stay away from medicines like ibuprofen, Advil, Motrin, naproxen, and Aleve due to risk of stomach bleeding. You may take Tylenol as directed or talk to your primary doctor about alternatives.   Increase  activity slowly   Complete by:  As directed        Discharge Medications     Medication List    STOP taking these medications   flecainide 50 MG tablet Commonly known as:  TAMBOCOR     TAKE these medications   allopurinol  300 MG tablet Commonly known as:  ZYLOPRIM Take 450 mg by mouth daily.   aspirin 81 MG tablet Take 81 mg by mouth daily.   atorvastatin 80 MG tablet Commonly known as:  LIPITOR Take 1 tablet (80 mg total) by mouth daily at 6 PM.   benazepril 40 MG tablet Commonly known as:  LOTENSIN Take 0.5 tablets (20 mg total) by mouth daily. What changed:  medication strength   ferrous sulfate 325 (65 FE) MG tablet Take 325 mg by mouth 2 (two) times daily.   hydrALAZINE 50 MG tablet Commonly known as:  APRESOLINE Take 1 tablet (50 mg total) by mouth every 8 (eight) hours.   hydrochlorothiazide 12.5 MG tablet Commonly known as:  HYDRODIURIL Take 12.5 mg by mouth daily. for high blood pressure   metoprolol succinate 50 MG 24 hr tablet Commonly known as:  TOPROL-XL TAKE 1 TABLET BY MOUTH ONCE DAILY WITH FOOD OR  IMMEDIATELY  FOLLOWING  A  MEAL   multivitamin tablet Take 1 tablet by mouth daily.   nitroGLYCERIN 0.4 MG SL tablet Commonly known as:  NITROSTAT Place 1 tablet (0.4 mg total) under the tongue every 5 (five) minutes x 3 doses as needed for chest pain.   pantoprazole 40 MG tablet Commonly known as:  PROTONIX TAKE 1 TABLET BY MOUTH ONCE DAILY What changed:  when to take this   ticagrelor 90 MG Tabs tablet Commonly known as:  BRILINTA Take 1 tablet (90 mg total) by mouth 2 (two) times daily.   vitamin C 500 MG tablet Commonly known as:  ASCORBIC ACID Take 500 mg by mouth daily.        Acute coronary syndrome (MI, NSTEMI, STEMI, etc) this admission?: Yes.     AHA/ACC Clinical Performance & Quality Measures: 1. Aspirin prescribed? - Yes 2. ADP Receptor Inhibitor (Plavix/Clopidogrel, Brilinta/Ticagrelor or Effient/Prasugrel) prescribed  (includes medically managed patients)? - Yes 3. Beta Blocker prescribed? - Yes 4. High Intensity Statin (Lipitor 40-80mg  or Crestor 20-40mg ) prescribed? - Yes 5. EF assessed during THIS hospitalization? - Yes 6. For EF <40%, was ACEI/ARB prescribed? - Yes 7. For EF <40%, Aldosterone Antagonist (Spironolactone or Eplerenone) prescribed? - Not Applicable (EF >/= 68%) 8. Cardiac Rehab Phase II ordered (Included Medically managed Patients)? - Yes     Outstanding Labs/Studies   FLP/LFTs in 6 weeks if tolerating statin.   Duration of Discharge Encounter   Greater than 30 minutes including physician time.  Signed, Reino Bellis NP-C 01/03/2018, 8:40 AM

## 2018-01-03 NOTE — Telephone Encounter (Signed)
-----   Message from Satira Sark, MD sent at 01/03/2018  8:13 AM EDT ----- Regarding: RE: flecainide 50 MG BID Thank you for the update.  Actually, per the discharge summary, flecainide was discontinued at discharge from the hospital. This is appropriate in light of obstructive CAD recently documented.  I will have nursing contact the patient to make sure that he has stopped flecainide.  We can readdress need for antiarrhythmic therapy and anticoagulation at office follow-up.  ----- Message ----- From: Karilyn Cota, RN Sent: 01/02/2018  12:31 PM EDT To: Cheryln Manly, NP, Satira Sark, MD, # Subject: flecainide 50 MG BID                           Hey Dr. Lia Foyer  wanted to make you aware that this patient is on flecainide 50 MG BID and now has CAD.  I did enroll him in XIENCE 28 Research study and @ day so he will stop Brilinta and continue ASA 81 alone (per protocol)    Thanks Tammy

## 2018-01-03 NOTE — Telephone Encounter (Signed)
Attempt to reach, [id number] 

## 2018-01-03 NOTE — Progress Notes (Signed)
Patient and spouse given d/c instructions and verablizes understanding. PIV's d/c'd and patient taken to car via [place] with volunteer.

## 2018-01-03 NOTE — Telephone Encounter (Signed)
XIENCE 28  Lesion Complexity []  A []  B1 []  B2 [x] C   Thrombus Present  []  Yes [x]  No      Bifurcation Lesion  [x]  Yes []  No       If yes, indicate Medina Classification []  111 []  110 []  101 []  011 [x]  100 []  010 []  001   Lesion Length  __10___mm      Reference Vessel Diameter (Visual Estimation) _3.25____mm      Final In-Stent % Diameter Stenosis __0__ %      Residual dissection is not NHLBI grade >= type B [x]  Yes []  No       No residual dissection  Jettie Booze, MD

## 2018-01-03 NOTE — Telephone Encounter (Signed)
I spoke with patient and wife and they stated they understood that pt is to stop flecainide. Wife questioned lisinopril dose, and according to D/C instructions, pt is to take 0.5 mg of the 40 mg tablet daily

## 2018-01-04 NOTE — Telephone Encounter (Signed)
I have sent staff message to Reino Bellis NP, to clarify for wife

## 2018-01-10 NOTE — Research (Signed)
GENERAL INCLUSIONS:  [x]  YES   []  NO 1. Subject is considered at high risk for bleeding (HBR), defined as meeting one or more of the following criteria at the time of registration and in the opinion of the referring physician, the risk of major bleeding with > 46-month DAPT outweighs the benefit:  a) ? 77 years of age.  b) Clinical indication for chronic (at least 6 months) or lifelong anticoagulation therapy.  c) History of major bleeding which required medical attention within 12 months of the index procedure.  d) History of stroke (ischemic or hemorrhagic).  e) Renal insufficiency (creatinine ? 2.0 mg/dl) or failure (dialysis dependent).  f) Systemic conditions associated with an increased bleeding risk (e.g. hematological disorders, including a history of or current thrombocytopenia defined as a platelet count <100,000/mm3, or any known coagulation disorder associated with increased bleeding risk).  g) Anemia with hemoglobin < 11g/dl.   [x]  YES   []  NO 2. Subject must be at least 77 years of age  [x]  YES   []  NO 3. Subject must provide written informed consent as approved by the Institutional Review Board (IRB) of the respective clinical site prior to any trial related procedure.   [x]  YES   []  NO 4. Subject is willing to comply with all protocol requirements, including agreement to stop taking P2Y12 inhibitor at 1 month, if eligible per protocol.   [x]  YES   []  NO 5. Subject must agree not to participate in any other clinical trial for a period of one year following the index procedure, except for cases where subject is transferred to the XIENCE 90 study after the 23-month visit assessment.      GENERAL EXCLUSIONS:   []  YES   [x]  NO 1. Subject with an indication for the index procedure of acute ST-segment elevation MI (STEMI).   []  YES   [x]  NO 2. Subject has a known hypersensitivity or contraindication to aspirin, heparin/bivalirudin, P2Y12 inhibitors (clopidogrel/prasugrel/ticagrelor),  everolimus, cobalt, chromium, nickel, tungsten, acrylic and fluoro polymers or contrast sensitivity that cannot be adequately pre-medicated.   []  YES   [x]  NO 3. Subject with implantation of another drug-eluting stent (other than XIENCE) within 12 months prior to index procedure.   []  YES   [x]  NO 4. Subject has a known left ventricular ejection fraction (LVEF) <30%.   []  YES   [x]  NO 5. Subject judged by physician as inappropriate for discontinuation from P2Y12 inhibitor use at 1 month, due to another condition requiring chronic P2Y12 inhibitor use.   []  YES   [x]  NO 6. Subject with planned surgery or procedure necessitating discontinuation of P2Y12 inhibitor within 1 month following index procedure.   []  YES   [x]  NO 7. Subject with a current medical condition with a life expectancy of less than 12 months.   []  YES   [x]  NO 8. Subject intends to participate in an investigational drug or device trial within 12 months following the index procedure. Transferring to the XIENCE 90 study will not be an exlusion criterion.   []  YES   [x]  NO 9. Pregnant or nursing subjects and those who plan pregnancy in the period up to 1 year following index procedure. Male subjects of child-bearing potential must have a negative pregnancy test done within 7 days prior to the index procedure per site standard test.   Note: Male subjects of childbearing potential should be instructed to use safe contraception (e.g., intrauterine devices, hormonal contraceptives: contraceptive pills, implants, transdermal patches, hormonal  vaginal devices, injections with prolonged release.) It is accepted, in certain cases, to include subjects having a sterilised regular partner or subjects using a double barrier contraceptive method. However, this should be explicitly justified in special circumstances arising from the trial design, product characteristics and/or trial population.  []  YES   [x]  NO 10. Presence of other anatomic or comorbid  conditions, or other medical, social, or psychological conditions that, in the investigator's opinion, could limit the subject's ability to participate in the clinical investigation or to comply with follow-up requirements, or impact the scientific soundness of the clinical investigation results.   []  YES   [x]  NO 11. Subject is currently participating in another clinical trial that has not yet completed its primary endpoint.         ANGIOGRAPHIC INCLUSION CRITERIA  [x]  YES    []  NO 1. Up to three target lesions with a maximum of two target lesions per epicardial vessel. Note:  . The definition of epicardial vessels means left anterior descending coronary artery (LAD), left circumflex coronary artery (LCX) and right coronary artery (RCA) and their branches. For example, the subject must not have >2 lesions requiring treatment within both the LAD and a diagonal branch in total.  . If there are two target lesions within the same epicardial vessel, the two target lesions must be at least 15 mm apart per visual estimation; otherwise this is considered as a single target lesion.   [x]  YES    []  NO 2. Target lesion must be located in a native coronary artery with visually estimated reference vessel diameter between 2.25 mm and 4.25 mm.   [x]  YES    []  NO 3. Exclusive use of XIENCE family of stent systems during the index procedure.   []  YES    []  NO 4. Target lesion has been treated successfully, which is defined as achievement of a final in-stent residual diameter stenosis of <20% with final TIMI-3 flow assessed by online quantitative angiography or visual estimation, with no residual dissection NHLBI grade ? type B, and no transient or sustained angiographic complications (e.g., distal embolization, side branch closure), no chest pain lasting > 5 minutes, and no ST segment elevation > 0.88mm or depression lasting > 5 minutes.      ANGIOGRAPHIC EXCLUSION CRITERIA  []  YES    [x]  NO 1. Target lesion is in a  left main location.  []  YES    [x]  NO 2. Target lesion is located within an arterial or saphenous vein graft.  []  YES    [x]  NO 3. Target lesion is restenotic from a previous stent implantation.  []  YES    [x]  NO 4. Target lesion is a chronic total occlusion (CTO, defined as lesion with TIMI flow 0 for at least 3 months).  []  YES    [x]  NO 5. Target lesion is implanted with overlapping stents, whether planned or for bailout.

## 2018-01-14 DIAGNOSIS — R69 Illness, unspecified: Secondary | ICD-10-CM | POA: Diagnosis not present

## 2018-01-17 ENCOUNTER — Encounter: Payer: Self-pay | Admitting: Cardiology

## 2018-01-17 NOTE — Progress Notes (Signed)
Cardiology Office Note  Date: 01/18/2018   ID: [name] [name], DOB 11-28-1940, MRN [id number]  PCP: Caryl Bis, MD  Primary Cardiologist: Rozann Lesches, MD   Chief Complaint  Patient presents with  . Coronary Artery Disease  . PAF    History of Present Illness: [name] [name] is a 77 y.o. male that I last saw in July.  In the interim he was admitted to [facility] in October with progressive chest pain and ultimately evidence of NSTEMI with troponin I elevation up to 0.48.  He underwent cardiac catheterization with Dr. Irish Lack revealing a 95% mid circumflex stenosis that was managed with DES, otherwise nonobstructive disease.   He is here with his wife for a follow-up visit.  He states that he feels better, no chest tightness or unusual breathlessness.  He started walking again for exercise.  He does not report any palpitations, dizziness, or syncope.  No bleeding problems on dual antiplatelet therapy.  He has a history of paroxysmal atrial fibrillation, of note he was taken off flecainide at recent hospital discharge due to diagnosis of ischemic heart disease.  He has a CHADSVASC score of 3 and has declined anticoagulation.  He continues on beta-blocker.  We went over his medications and also home blood pressure checks which are generally much better than he was having prior to his coronary intervention.  He would like to try to simplify his regimen if possible, and we talked about some adjustments today.  Past Medical History:  Diagnosis Date  . Anemia   . Arthritis   . CAD (coronary artery disease)    DES to circumflex 12/2017  . Gout   . Headache   . History of kidney stones   . Hypertension   . NSTEMI (non-ST elevated myocardial infarction) ([place]) 12/31/2017  . Paroxysmal atrial fibrillation (HCC)   . Pericardial effusion    Idiopathic, recurrent pericardial effusion s/p pericardial window.  . Prostate cancer ([place]) 2007   S/P Seed implants   .  Supraventricular tachycardia ([place])   . Temporal arteritis [facility])     Past Surgical History:  Procedure Laterality Date  . CATARACT EXTRACTION W/PHACO Left 06/07/2015   Procedure: CATARACT EXTRACTION PHACO AND INTRAOCULAR LENS PLACEMENT LEFT EYE CDE=8.00;  Surgeon: Tonny Branch, MD;  Location: [facility];  Service: Ophthalmology;  Laterality: Left;  . CATARACT EXTRACTION W/PHACO Right 06/17/2015   Procedure: CATARACT EXTRACTION PHACO AND INTRAOCULAR LENS PLACEMENT RIGHT EYE CDE=11.09;  Surgeon: Tonny Branch, MD;  Location: [facility];  Service: Ophthalmology;  Laterality: Right;  . CORONARY ANGIOPLASTY WITH STENT PLACEMENT  01/01/2018  . CORONARY STENT INTERVENTION N/A 01/01/2018   Procedure: CORONARY STENT INTERVENTION;  Surgeon: Jettie Booze, MD;  Location: [place] [facility];  Service: Cardiovascular;  Laterality: N/A;  . FRACTURE SURGERY    . INSERTION PROSTATE RADIATION SEED  2007  . KNEE ARTHROSCOPY Left   . LEFT HEART CATH AND CORONARY ANGIOGRAPHY N/A 01/01/2018   Procedure: LEFT HEART CATH AND CORONARY ANGIOGRAPHY;  Surgeon: Jettie Booze, MD;  Location: [place] [facility];  Service: Cardiovascular;  Laterality: N/A;  . ORIF TIBIA & FIBULA FRACTURES Left 2003   Distal tibial/fibula   . PERICARDIAL WINDOW  December 2008  . PERICARDIOCENTESIS  2007   hx/notes 10/19/2011    Current Outpatient Medications  Medication Sig Dispense Refill  . allopurinol (ZYLOPRIM) 300 MG tablet Take 450 mg by mouth daily.     Marland Kitchen aspirin 81 MG tablet Take  81 mg by mouth daily.    Marland Kitchen atorvastatin (LIPITOR) 80 MG tablet Take 1 tablet (80 mg total) by mouth daily at 6 PM. 30 tablet 1  . benazepril (LOTENSIN) 40 MG tablet Take 1 tablet (40 mg total) by mouth daily. 30 tablet 3  . ferrous sulfate 325 (65 FE) MG tablet Take 325 mg by mouth 2 (two) times daily.    . hydrochlorothiazide (HYDRODIURIL) 12.5 MG tablet Take 12.5 mg by mouth daily. for high blood pressure  1  . metoprolol succinate (TOPROL-XL) 50 MG  24 hr tablet TAKE 1 TABLET BY MOUTH ONCE DAILY WITH FOOD OR  IMMEDIATELY  FOLLOWING  A  MEAL 90 tablet 0  . Multiple Vitamin (MULTIVITAMIN) tablet Take 1 tablet by mouth daily.    . nitroGLYCERIN (NITROSTAT) 0.4 MG SL tablet Place 1 tablet (0.4 mg total) under the tongue every 5 (five) minutes x 3 doses as needed for chest pain. 25 tablet 1  . pantoprazole (PROTONIX) 40 MG tablet TAKE 1 TABLET BY MOUTH ONCE DAILY (Patient taking differently: Take 40 mg by mouth 2 (two) times daily. ) 90 tablet 3  . ticagrelor (BRILINTA) 90 MG TABS tablet Take 1 tablet (90 mg total) by mouth 2 (two) times daily. 60 tablet 6  . vitamin C (ASCORBIC ACID) 500 MG tablet Take 500 mg by mouth daily.    . hydrALAZINE (APRESOLINE) 25 MG tablet Take 1 tablet (25 mg total) by mouth 2 (two) times daily. 180 tablet 3   No current facility-administered medications for this visit.    Allergies:  Amiodarone   Social History: The patient  reports that he quit smoking about 51 years ago. His smoking use included cigarettes. He started smoking about 59 years ago. He has a 3.00 pack-year smoking history. He has never used smokeless tobacco. He reports that he drank alcohol. He reports that he does not use drugs.   ROS:  Please see the history of present illness. Otherwise, complete review of systems is positive for none.  All other systems are reviewed and negative.   Physical Exam: VS:  BP (!) 128/58 (BP Location: Right Arm)   Pulse 67   Ht 6\' 1"  (1.854 m)   Wt 223 lb (101.2 kg)   SpO2 97%   BMI 29.42 kg/m , BMI Body mass index is 29.42 kg/m.  Wt Readings from Last 3 Encounters:  01/18/18 223 lb (101.2 kg)  01/03/18 220 lb 14.4 oz (100.2 kg)  10/24/17 227 lb 6.4 oz (103.1 kg)    General: Patient appears comfortable at rest. HEENT: Conjunctiva and lids normal, oropharynx clear. Neck: Supple, no elevated JVP or carotid bruits, no thyromegaly. Lungs: Clear to auscultation, nonlabored breathing at rest. Cardiac: Regular  rate and rhythm, no S3, 3/6 systolic murmur, no pericardial rub. Abdomen: Soft, nontender, bowel sounds present. Extremities: Mild ankle edema, distal pulses 2+. Skin: Warm and dry. Musculoskeletal: No kyphosis. Neuropsychiatric: Alert and oriented x3, affect grossly appropriate.  ECG: I personally reviewed the tracing from 01/01/2018 which showed sinus bradycardia with leftward axis.  Recent Labwork: 12/31/2017: ALT 18; AST 23; B Natriuretic Peptide 210.0; Magnesium 2.1; TSH 5.639 01/02/2018: BUN 13; Creatinine, Ser 0.84; Hemoglobin 11.1; Platelets 182; Potassium 3.4; Sodium 136     Component Value Date/Time   CHOL 142 01/01/2018 0626   TRIG 73 01/01/2018 0626   HDL 34 (L) 01/01/2018 0626   CHOLHDL 4.2 01/01/2018 0626   VLDL 15 01/01/2018 0626   LDLCALC 93 01/01/2018 0626  Other Studies Reviewed Today:  Cardiac catheterization and PCI 01/01/2018:  Mid Cx lesion is 95% stenosed.  Prox LAD lesion is 25% stenosed.  Mid LAD lesion is 30% stenosed.  Prox RCA to Mid RCA lesion is 50% stenosed.  The left ventricular systolic function is normal.  LV end diastolic pressure is normal. LVEDP 12 mm Hg.  The left ventricular ejection fraction is 50-55% by visual estimate.  There is no aortic valve stenosis.  Severe tortuosity in the right subclavian which makes torquing catheters difficult. Would consider femoral approach in the future, particularly if emergent cardiac cath was needed.  A drug-eluting stent was successfully placed using a STENT SIERRA 3.25 X 15 MM.  Post intervention, there is a 0% residual stenosis.   Recommend uninterrupted dual antiplatelet therapy with Aspirin 81mg  daily and Ticagrelor 90mg  twice daily for a minimum of 12 months (ACS - Class I recommendation).   Echocardiogram 01/02/2018: Study Conclusions  - Left ventricle: The cavity size was normal. There was severe   focal basal hypertrophy. Systolic function was normal. The   estimated ejection  fraction was in the range of 55% to 60%. Wall   motion was normal; there were no regional wall motion   abnormalities. There was an increased relative contribution of   atrial contraction to ventricular filling. Doppler parameters are   consistent with abnormal left ventricular relaxation (grade 1   diastolic dysfunction). - Mitral valve: Calcified annulus. Valve area by pressure   half-time: 1.59 cm^2. - Left atrium: The atrium was mildly dilated. - Pulmonic valve: There was trivial regurgitation. - Pulmonary arteries: Systolic pressure could not be accurately   estimated. - Pericardium, extracardiac: A trivial pericardial effusion was   identified posterior to the heart.  Assessment and Plan:  1.  CAD status post recent NSTEMI with subsequent placement of DES to the mid circumflex and otherwise nonobstructive disease.  LVEF normal by echocardiogram.  We discussed the importance of continuing dual antiplatelet therapy.  He reports no active angina at this time.  Continue walking for exercise.  2.  Paroxysmal atrial fibrillation, currently in sinus rhythm.  He has prior intolerance to amiodarone and was recently taken off flecainide in light of new diagnosis of ischemic heart disease.  He has also declined anticoagulation long-term.  Continue antiplatelet regimen and beta-blocker for now.  3.  Heart murmur in the setting of vigorous LV systolic function as well as aortic valve sclerosis.  No obvious stenosis by recent echocardiograms.  4.  Essential hypertension.  Plan at this time is to increase benazepril to 40 mg daily with follow-up BME T in 2 weeks.  Concurrently reduce hydralazine 25 mg twice daily with plan to discontinue with possible depending on blood pressure control.  They will continue to check blood pressure regularly at home.  5.  Mixed hyperlipidemia, on Lipitor.  Recent LDL 93.  Will reassess over time.  Current medicines were reviewed with the patient today.   Orders  Placed This Encounter  Procedures  . Basic Metabolic Panel (BMET)    Disposition: Follow up in 8 weeks in the [place] office.  Signed, Satira Sark, MD, [facility] 01/18/2018 10:20 AM    [facility] [address] [place], [place] [address] Phone: [phone]; Fax: [phone]

## 2018-01-18 ENCOUNTER — Encounter: Payer: Self-pay | Admitting: Cardiology

## 2018-01-18 ENCOUNTER — Ambulatory Visit: Payer: Medicare HMO | Admitting: Cardiology

## 2018-01-18 VITALS — BP 128/58 | HR 67 | Ht 73.0 in | Wt 223.0 lb

## 2018-01-18 DIAGNOSIS — E782 Mixed hyperlipidemia: Secondary | ICD-10-CM | POA: Diagnosis not present

## 2018-01-18 DIAGNOSIS — I25119 Atherosclerotic heart disease of native coronary artery with unspecified angina pectoris: Secondary | ICD-10-CM

## 2018-01-18 DIAGNOSIS — I480 Paroxysmal atrial fibrillation: Secondary | ICD-10-CM | POA: Diagnosis not present

## 2018-01-18 DIAGNOSIS — I10 Essential (primary) hypertension: Secondary | ICD-10-CM

## 2018-01-18 DIAGNOSIS — R011 Cardiac murmur, unspecified: Secondary | ICD-10-CM | POA: Diagnosis not present

## 2018-01-18 MED ORDER — HYDRALAZINE HCL 25 MG PO TABS: 25.0000 mg | ORAL_TABLET | Freq: Two times a day (BID) | ORAL | 3 refills | Status: DC

## 2018-01-18 MED ORDER — BENAZEPRIL HCL 40 MG PO TABS: 40.0000 mg | ORAL_TABLET | Freq: Every day | ORAL | 3 refills | Status: DC

## 2018-01-18 NOTE — Patient Instructions (Signed)
Medication Instructions:  DECREASE HYDRALAZINE TO 25 MG - TWO TIMES DAILY   INCREASE LOTENSIN TO 40 MG DAILY   If you need a refill on your cardiac medications before your next appointment, please call your pharmacy.   Lab work: 2 WEEKS -  BMET   Testing/Procedures: NONE  Follow-Up: At [facility], you and your health needs are our priority.  As part of our continuing mission to provide you with exceptional heart care, we have created designated Provider Care Teams.  These Care Teams include your primary Cardiologist (physician) and Advanced Practice Providers (APPs -  Physician Assistants and Nurse Practitioners) who all work together to provide you with the care you need, when you need it. You will need a follow up appointment in 8 weeks.  Please call our office 2 months in advance to schedule this appointment.  You may see Rozann Lesches, MD or one of the following Advanced Practice Providers on your designated Care Team:   Bernerd Pho, PA-C [facility]) . Ermalinda Barrios, PA-C ([place])

## 2018-01-23 DIAGNOSIS — I2510 Atherosclerotic heart disease of native coronary artery without angina pectoris: Secondary | ICD-10-CM | POA: Diagnosis not present

## 2018-01-23 DIAGNOSIS — E782 Mixed hyperlipidemia: Secondary | ICD-10-CM | POA: Diagnosis not present

## 2018-01-23 DIAGNOSIS — I10 Essential (primary) hypertension: Secondary | ICD-10-CM | POA: Diagnosis not present

## 2018-01-23 DIAGNOSIS — E1165 Type 2 diabetes mellitus with hyperglycemia: Secondary | ICD-10-CM | POA: Diagnosis not present

## 2018-01-24 ENCOUNTER — Other Ambulatory Visit: Payer: Self-pay | Admitting: Cardiology

## 2018-01-31 ENCOUNTER — Encounter (HOSPITAL_COMMUNITY): Payer: Self-pay | Admitting: Interventional Cardiology

## 2018-01-31 DIAGNOSIS — E1165 Type 2 diabetes mellitus with hyperglycemia: Secondary | ICD-10-CM | POA: Diagnosis not present

## 2018-01-31 DIAGNOSIS — I25119 Atherosclerotic heart disease of native coronary artery with unspecified angina pectoris: Secondary | ICD-10-CM | POA: Diagnosis not present

## 2018-01-31 DIAGNOSIS — D519 Vitamin B12 deficiency anemia, unspecified: Secondary | ICD-10-CM | POA: Diagnosis not present

## 2018-01-31 DIAGNOSIS — D649 Anemia, unspecified: Secondary | ICD-10-CM | POA: Diagnosis not present

## 2018-01-31 DIAGNOSIS — D529 Folate deficiency anemia, unspecified: Secondary | ICD-10-CM | POA: Diagnosis not present

## 2018-01-31 DIAGNOSIS — I480 Paroxysmal atrial fibrillation: Secondary | ICD-10-CM | POA: Diagnosis not present

## 2018-02-01 ENCOUNTER — Telehealth: Payer: Self-pay

## 2018-02-01 ENCOUNTER — Encounter: Payer: Medicare HMO | Admitting: *Deleted

## 2018-02-01 ENCOUNTER — Other Ambulatory Visit: Payer: Self-pay

## 2018-02-01 VITALS — BP 148/52 | HR 98 | Resp 14 | Wt 221.8 lb

## 2018-02-01 DIAGNOSIS — Z006 Encounter for examination for normal comparison and control in clinical research program: Secondary | ICD-10-CM

## 2018-02-01 MED ORDER — CLOPIDOGREL BISULFATE 75 MG PO TABS: 75.0000 mg | ORAL_TABLET | Freq: Every day | ORAL | 3 refills | Status: DC

## 2018-02-01 NOTE — Research (Addendum)
Xience 1 month visit  Pt here today to see Dr Lia Foyer. He is 1 month post PCI for non-STEMI.   Pt hasn't fallen in the past, is not unsteady, has no history of GI bleeding, and no family history of bleeding.  Only high risk is age of 26; has paroxsymal AFIB but not on anticoagulation.  Here to decide on DAPT or stop DAPT per Xience-28 protocol.   Dr. Lia Foyer has seen patient, and assessed that risk of ischemia at present exceeds risk of bleeding.  EKG today read NSR.     Wife is concerned Brilinta and the price, will reach out to Dr Domenic Polite to see if he is ok with switching to Plavix.  Wife states that pt only has enough Brilinta to last till Saturday.   Will call for next phone visit.

## 2018-02-01 NOTE — Telephone Encounter (Signed)
-----   Message from Satira Sark, MD sent at 02/01/2018 12:00 PM EST ----- Regarding: RE: Brilinta/plavix Will forward to our nursing staff here in [place] so that they can contact the patient and make arrangements to have Plavix called into his pharmacy to replace [place].  ----- Message ----- From: Patton Salles, RN Sent: 02/01/2018  11:56 AM EST To: Satira Sark, MD, Karilyn Cota, RN Subject: Brilinta/plavix                                Happy Friday,  We saw patient in office today for his research visit. While here the decision was made for patient to stay on DAPT instead of coming off at 30 days. Pt wife is concerned due to Kary Kos is expensive and was wondering if pt could come off Brilinta and start Plavix. Wife stated that he only has enough Brilinta to last till Saturday. If you could please address. Thanks so much!! Joelene Millin :)

## 2018-02-01 NOTE — Research (Signed)
Patient seen and evaluated.  Here for Xience 28 30 day visit.  Patient qualifies due to age 77, but has no other major bleeding risk factors.  No history of falls, excessive bleeding with cuts, GI bleed.  No prior history of bleeding. Patient ambulates without difficulty.  Patient presented with non STEMI with positive enzymes, and currently treated with Ticagrelor and ASA, guidelines for one year DAPT.  Based on patient risk assessment of ischemic risk versus bleeding risk, will continue DAPT at present.    ECG WNL   [name] [name]. Lia Foyer, MD, [facility], [place] Director, [facility].

## 2018-02-01 NOTE — Telephone Encounter (Signed)
I spoke with wife, patient will stop Brilinta and start plavix 75 mg daily

## 2018-02-06 DIAGNOSIS — R195 Other fecal abnormalities: Secondary | ICD-10-CM | POA: Diagnosis not present

## 2018-02-08 NOTE — Research (Signed)
See my note created under [name] [name] Patient seen and evaluated, decision made as noted to continue on DAPT as prescribed.   Addison Lank, MD, [facility], [place] Director, [facility]

## 2018-02-13 DIAGNOSIS — Z8546 Personal history of malignant neoplasm of prostate: Secondary | ICD-10-CM | POA: Diagnosis not present

## 2018-02-13 DIAGNOSIS — I10 Essential (primary) hypertension: Secondary | ICD-10-CM | POA: Diagnosis not present

## 2018-02-13 DIAGNOSIS — I2510 Atherosclerotic heart disease of native coronary artery without angina pectoris: Secondary | ICD-10-CM | POA: Diagnosis not present

## 2018-02-13 DIAGNOSIS — M109 Gout, unspecified: Secondary | ICD-10-CM | POA: Diagnosis not present

## 2018-02-13 DIAGNOSIS — M1990 Unspecified osteoarthritis, unspecified site: Secondary | ICD-10-CM | POA: Diagnosis not present

## 2018-02-13 DIAGNOSIS — Z79899 Other long term (current) drug therapy: Secondary | ICD-10-CM | POA: Diagnosis not present

## 2018-02-13 DIAGNOSIS — J449 Chronic obstructive pulmonary disease, unspecified: Secondary | ICD-10-CM | POA: Diagnosis not present

## 2018-02-13 DIAGNOSIS — R195 Other fecal abnormalities: Secondary | ICD-10-CM | POA: Diagnosis not present

## 2018-02-13 DIAGNOSIS — D649 Anemia, unspecified: Secondary | ICD-10-CM | POA: Diagnosis not present

## 2018-02-13 DIAGNOSIS — Z7902 Long term (current) use of antithrombotics/antiplatelets: Secondary | ICD-10-CM | POA: Diagnosis not present

## 2018-02-13 DIAGNOSIS — R198 Other specified symptoms and signs involving the digestive system and abdomen: Secondary | ICD-10-CM | POA: Diagnosis not present

## 2018-02-20 ENCOUNTER — Other Ambulatory Visit: Payer: Self-pay | Admitting: Cardiology

## 2018-02-26 DIAGNOSIS — R195 Other fecal abnormalities: Secondary | ICD-10-CM | POA: Diagnosis not present

## 2018-03-04 DIAGNOSIS — R69 Illness, unspecified: Secondary | ICD-10-CM | POA: Diagnosis not present

## 2018-03-12 DIAGNOSIS — D509 Iron deficiency anemia, unspecified: Secondary | ICD-10-CM | POA: Diagnosis not present

## 2018-03-12 DIAGNOSIS — D649 Anemia, unspecified: Secondary | ICD-10-CM | POA: Diagnosis not present

## 2018-03-15 ENCOUNTER — Ambulatory Visit: Payer: Medicare HMO | Admitting: Cardiology

## 2018-03-15 ENCOUNTER — Encounter: Payer: Self-pay | Admitting: Cardiology

## 2018-03-15 VITALS — BP 128/56 | HR 68 | Ht 72.0 in | Wt 225.0 lb

## 2018-03-15 DIAGNOSIS — I25119 Atherosclerotic heart disease of native coronary artery with unspecified angina pectoris: Secondary | ICD-10-CM

## 2018-03-15 DIAGNOSIS — I480 Paroxysmal atrial fibrillation: Secondary | ICD-10-CM

## 2018-03-15 DIAGNOSIS — E782 Mixed hyperlipidemia: Secondary | ICD-10-CM | POA: Diagnosis not present

## 2018-03-15 DIAGNOSIS — I10 Essential (primary) hypertension: Secondary | ICD-10-CM | POA: Diagnosis not present

## 2018-03-15 NOTE — Patient Instructions (Addendum)
Medication Instructions:   Your physician recommends that you continue on your current medications as directed. Please refer to the Current Medication list given to you today.  Labwork:  Your physician recommends that you return for a FASTING lipid profile: in 6 months just before your next visit. We will notify you when this is due.  Testing/Procedures:  NONE  Follow-Up:  Your physician recommends that you schedule a follow-up appointment in: 6 months. You will receive a reminder letter in the mail in about 4 months reminding you to call and schedule your appointment. If you don't receive this letter, please contact our office.  Any Other Special Instructions Will Be Listed Below (If Applicable).  If you need a refill on your cardiac medications before your next appointment, please call your pharmacy.

## 2018-03-15 NOTE — Progress Notes (Signed)
Cardiology Office Note  Date: 03/15/2018   ID: [name] [name], DOB 1940/08/22, MRN [id number]  PCP: Caryl Bis, MD  Primary Cardiologist: Rozann Lesches, MD   Chief Complaint  Patient presents with  . Coronary Artery Disease    History of Present Illness: [name] [name] is a 77 y.o. male last seen in October.  He is here for a routine visit with his wife.  He reports no angina symptoms and states that he has gotten back to walking regularly for exercise.  Generally goes anywhere from 1 to 2 miles at a time.  He reports NYHA class II dyspnea.  No palpitations or syncope.  At the last visit benazepril was increased to 40 mg daily.  He has tolerated this and blood pressure looks well controlled today.  I reviewed his interval research clinic visit note, he was changed from Brilinta to Plavix.  He tells me that he had a colonoscopy per Dr. Anthony Sar with history of heme positive stool, no acute findings however.  Past Medical History:  Diagnosis Date  . Anemia   . Arthritis   . CAD (coronary artery disease)    DES to circumflex 12/2017  . Gout   . Headache   . History of kidney stones   . Hypertension   . NSTEMI (non-ST elevated myocardial infarction) ([place]) 12/31/2017  . Paroxysmal atrial fibrillation (HCC)   . Pericardial effusion    Idiopathic, recurrent pericardial effusion s/p pericardial window.  . Prostate cancer ([place]) 2007   S/P Seed implants   . Supraventricular tachycardia ([place])   . Temporal arteritis [facility])     Past Surgical History:  Procedure Laterality Date  . CATARACT EXTRACTION W/PHACO Left 06/07/2015   Procedure: CATARACT EXTRACTION PHACO AND INTRAOCULAR LENS PLACEMENT LEFT EYE CDE=8.00;  Surgeon: Tonny Branch, MD;  Location: [facility];  Service: Ophthalmology;  Laterality: Left;  . CATARACT EXTRACTION W/PHACO Right 06/17/2015   Procedure: CATARACT EXTRACTION PHACO AND INTRAOCULAR LENS PLACEMENT RIGHT EYE CDE=11.09;  Surgeon: Tonny Branch, MD;  Location: [facility];   Service: Ophthalmology;  Laterality: Right;  . CORONARY ANGIOPLASTY WITH STENT PLACEMENT  01/01/2018  . CORONARY STENT INTERVENTION N/A 01/01/2018   Procedure: CORONARY STENT INTERVENTION;  Surgeon: Jettie Booze, MD;  Location: [place] [facility];  Service: Cardiovascular;  Laterality: N/A;  . FRACTURE SURGERY    . INSERTION PROSTATE RADIATION SEED  2007  . KNEE ARTHROSCOPY Left   . LEFT HEART CATH AND CORONARY ANGIOGRAPHY N/A 01/01/2018   Procedure: LEFT HEART CATH AND CORONARY ANGIOGRAPHY;  Surgeon: Jettie Booze, MD;  Location: [place] [facility];  Service: Cardiovascular;  Laterality: N/A;  . ORIF TIBIA & FIBULA FRACTURES Left 2003   Distal tibial/fibula   . PERICARDIAL WINDOW  December 2008  . PERICARDIOCENTESIS  2007   hx/notes 10/19/2011    Current Outpatient Medications  Medication Sig Dispense Refill  . allopurinol (ZYLOPRIM) 300 MG tablet Take 450 mg by mouth daily.     Marland Kitchen aspirin 81 MG tablet Take 81 mg by mouth daily.    Marland Kitchen atorvastatin (LIPITOR) 80 MG tablet TAKE 1 TABLET BY MOUTH ONCE DAILY AT  6PM 30 tablet 6  . benazepril (LOTENSIN) 40 MG tablet Take 1 tablet (40 mg total) by mouth daily. 30 tablet 3  . clopidogrel (PLAVIX) 75 MG tablet Take 1 tablet (75 mg total) by mouth daily. 90 tablet 3  . ferrous sulfate 325 (65 FE) MG tablet Take 325 mg by mouth 2 (  two) times daily.    . hydrALAZINE (APRESOLINE) 25 MG tablet Take 1 tablet (25 mg total) by mouth 2 (two) times daily. 180 tablet 3  . hydrochlorothiazide (HYDRODIURIL) 12.5 MG tablet Take 12.5 mg by mouth daily. for high blood pressure  1  . metoprolol succinate (TOPROL-XL) 50 MG 24 hr tablet TAKE 1 TABLET BY MOUTH ONCE DAILY WITH FOOD OR  IMMEDIATELY  FOLLOWING  THE  SAME  MEAL 90 tablet 3  . Multiple Vitamin (MULTIVITAMIN) tablet Take 1 tablet by mouth daily.    . nitroGLYCERIN (NITROSTAT) 0.4 MG SL tablet Place 1 tablet (0.4 mg total) under the tongue every 5 (five) minutes x 3 doses as needed for chest  pain. 25 tablet 1  . pantoprazole (PROTONIX) 40 MG tablet TAKE 1 TABLET BY MOUTH ONCE DAILY (Patient taking differently: Take 40 mg by mouth 2 (two) times daily. ) 90 tablet 3  . vitamin C (ASCORBIC ACID) 500 MG tablet Take 500 mg by mouth daily.     No current facility-administered medications for this visit.    Allergies:  Amiodarone   Social History: The patient  reports that he quit smoking about 52 years ago. His smoking use included cigarettes. He started smoking about 59 years ago. He has a 3.00 pack-year smoking history. He has never used smokeless tobacco. He reports previous alcohol use. He reports that he does not use drugs.   ROS:  Please see the history of present illness. Otherwise, complete review of systems is positive for hearing loss.  All other systems are reviewed and negative.   Physical Exam: VS:  BP (!) 128/56   Pulse 68   Ht 6' (1.829 m)   Wt 225 lb (102.1 kg)   SpO2 97%   BMI 30.52 kg/m , BMI Body mass index is 30.52 kg/m.  Wt Readings from Last 3 Encounters:  03/15/18 225 lb (102.1 kg)  02/01/18 221 lb 12.8 oz (100.6 kg)  01/18/18 223 lb (101.2 kg)    General: Patient appears comfortable at rest. HEENT: Conjunctiva and lids normal, oropharynx clear. Neck: Supple, no elevated JVP or carotid bruits, no thyromegaly. Lungs: Clear to auscultation, nonlabored breathing at rest. Cardiac: Regular rate and rhythm, no S3, 3/6 systolic murmur. Abdomen: Soft, nontender, bowel sounds present. Extremities: Trace ankle edema, distal pulses 2+. Skin: Warm and dry. Musculoskeletal: No kyphosis. Neuropsychiatric: Alert and oriented x3, affect grossly appropriate.  ECG: I personally reviewed the tracing from 02/01/2018 which showed normal sinus rhythm.  Recent Labwork: 12/31/2017: ALT 18; AST 23; B Natriuretic Peptide 210.0; Magnesium 2.1; TSH 5.639 01/02/2018: BUN 13; Creatinine, Ser 0.84; Hemoglobin 11.1; Platelets 182; Potassium 3.4; Sodium 136     Component Value  Date/Time   CHOL 142 01/01/2018 0626   TRIG 73 01/01/2018 0626   HDL 34 (L) 01/01/2018 0626   CHOLHDL 4.2 01/01/2018 0626   VLDL 15 01/01/2018 0626   LDLCALC 93 01/01/2018 0626    Other Studies Reviewed Today:  Cardiac catheterization and PCI 01/01/2018:  Mid Cx lesion is 95% stenosed.  Prox LAD lesion is 25% stenosed.  Mid LAD lesion is 30% stenosed.  Prox RCA to Mid RCA lesion is 50% stenosed.  The left ventricular systolic function is normal.  LV end diastolic pressure is normal. LVEDP 12 mm Hg.  The left ventricular ejection fraction is 50-55% by visual estimate.  There is no aortic valve stenosis.  Severe tortuosity in the right subclavian which makes torquing catheters difficult. Would consider femoral approach in  the future, particularly if emergent cardiac cath was needed.  A drug-eluting stent was successfully placed using a STENT SIERRA 3.25 X 15 MM.  Post intervention, there is a 0% residual stenosis. Recommend uninterrupted dual antiplatelet therapy with Aspirin 81mg  daily and Ticagrelor 90mg  twice daily for a minimum of 12 months (ACS - Class I recommendation).   Echocardiogram 01/02/2018:  Study Conclusions  - Left ventricle: The cavity size was normal. There was severe  focal basal hypertrophy. Systolic function was normal. The  estimated ejection fraction was in the range of 55% to 60%. Wall  motion was normal; there were no regional wall motion  abnormalities. There was an increased relative contribution of  atrial contraction to ventricular filling. Doppler parameters are  consistent with abnormal left ventricular relaxation (grade 1  diastolic dysfunction).  - Mitral valve: Calcified annulus. Valve area by pressure  half-time: 1.59 cm^2.  - Left atrium: The atrium was mildly dilated.  - Pulmonic valve: There was trivial regurgitation.  - Pulmonary arteries: Systolic pressure could not be accurately  estimated.  - Pericardium, extracardiac: A trivial  pericardial effusion was  identified posterior to the heart.   Assessment and Plan:  1.  CAD status post DES to the mid circumflex with otherwise nonobstructive disease as outlined above.  He is doing well, now on aspirin and Plavix (taken off Brilinta due to cost constraints).  He is symptomatically stable, I encouraged his walking plan for exercise.  2.  History of paroxysmal atrial fibrillation.  Now off flecainide in the setting of ischemic heart disease.  He declines anticoagulation.  Continue observation.  He is on a beta-blocker.  3.  Mixed hyperlipidemia, on Lipitor.  Follow-up FLP for next visit.  4.  Essential hypertension, blood pressure is adequately controlled on current regimen.  Current medicines were reviewed with the patient today.  Disposition: Follow-up in 6 months.  Signed, Satira Sark, MD, [facility] 03/15/2018 2:09 PM    [place] at [place], [place],  [address] Phone: [phone]; Fax: [phone]

## 2018-03-26 ENCOUNTER — Encounter (HOSPITAL_COMMUNITY): Payer: Self-pay | Admitting: *Deleted

## 2018-03-28 ENCOUNTER — Other Ambulatory Visit: Payer: Self-pay | Admitting: Cardiology

## 2018-03-28 DIAGNOSIS — Z006 Encounter for examination for normal comparison and control in clinical research program: Secondary | ICD-10-CM

## 2018-03-28 NOTE — Research (Signed)
   Contact Type:  []  Office/hospital [x]  Phone  3 month follow up with patient regarding Xience 28 trial. No problems at this time, patient states "he is feeling great and even walking more" Research will continue to follow up with patient.  Current Outpatient Medications on File Prior to Visit  Medication Sig Dispense Refill  . allopurinol (ZYLOPRIM) 300 MG tablet Take 450 mg by mouth daily.     Marland Kitchen aspirin 81 MG tablet Take 81 mg by mouth daily.    Marland Kitchen atorvastatin (LIPITOR) 80 MG tablet TAKE 1 TABLET BY MOUTH ONCE DAILY AT  6PM 30 tablet 6  . benazepril (LOTENSIN) 40 MG tablet Take 1 tablet (40 mg total) by mouth daily. 30 tablet 3  . clopidogrel (PLAVIX) 75 MG tablet Take 1 tablet (75 mg total) by mouth daily. 90 tablet 3  . ferrous sulfate 325 (65 FE) MG tablet Take 325 mg by mouth 2 (two) times daily.    . hydrALAZINE (APRESOLINE) 25 MG tablet Take 1 tablet (25 mg total) by mouth 2 (two) times daily. 180 tablet 3  . hydrochlorothiazide (HYDRODIURIL) 12.5 MG tablet Take 12.5 mg by mouth daily. for high blood pressure  1  . metoprolol succinate (TOPROL-XL) 50 MG 24 hr tablet TAKE 1 TABLET BY MOUTH ONCE DAILY WITH FOOD OR  IMMEDIATELY  FOLLOWING  THE  SAME  MEAL 90 tablet 3  . Multiple Vitamin (MULTIVITAMIN) tablet Take 1 tablet by mouth daily.    . nitroGLYCERIN (NITROSTAT) 0.4 MG SL tablet Place 1 tablet (0.4 mg total) under the tongue every 5 (five) minutes x 3 doses as needed for chest pain. 25 tablet 1  . pantoprazole (PROTONIX) 40 MG tablet TAKE 1 TABLET BY MOUTH ONCE DAILY (Patient taking differently: Take 40 mg by mouth 2 (two) times daily. ) 90 tablet 3  . vitamin C (ASCORBIC ACID) 500 MG tablet Take 500 mg by mouth daily.     No current facility-administered medications on file prior to visit.

## 2018-04-15 DIAGNOSIS — H40013 Open angle with borderline findings, low risk, bilateral: Secondary | ICD-10-CM | POA: Diagnosis not present

## 2018-04-16 DIAGNOSIS — N182 Chronic kidney disease, stage 2 (mild): Secondary | ICD-10-CM | POA: Diagnosis not present

## 2018-04-16 DIAGNOSIS — E782 Mixed hyperlipidemia: Secondary | ICD-10-CM | POA: Diagnosis not present

## 2018-04-16 DIAGNOSIS — E1165 Type 2 diabetes mellitus with hyperglycemia: Secondary | ICD-10-CM | POA: Diagnosis not present

## 2018-04-16 DIAGNOSIS — Z9189 Other specified personal risk factors, not elsewhere classified: Secondary | ICD-10-CM | POA: Diagnosis not present

## 2018-04-16 DIAGNOSIS — I10 Essential (primary) hypertension: Secondary | ICD-10-CM | POA: Diagnosis not present

## 2018-04-16 DIAGNOSIS — E1122 Type 2 diabetes mellitus with diabetic chronic kidney disease: Secondary | ICD-10-CM | POA: Diagnosis not present

## 2018-04-16 DIAGNOSIS — K219 Gastro-esophageal reflux disease without esophagitis: Secondary | ICD-10-CM | POA: Diagnosis not present

## 2018-04-17 ENCOUNTER — Other Ambulatory Visit: Payer: Self-pay | Admitting: Cardiology

## 2018-04-18 DIAGNOSIS — I10 Essential (primary) hypertension: Secondary | ICD-10-CM | POA: Diagnosis not present

## 2018-04-18 DIAGNOSIS — Z6831 Body mass index (BMI) 31.0-31.9, adult: Secondary | ICD-10-CM | POA: Diagnosis not present

## 2018-04-18 DIAGNOSIS — Z0001 Encounter for general adult medical examination with abnormal findings: Secondary | ICD-10-CM | POA: Diagnosis not present

## 2018-04-18 DIAGNOSIS — Z23 Encounter for immunization: Secondary | ICD-10-CM | POA: Diagnosis not present

## 2018-04-24 DIAGNOSIS — R69 Illness, unspecified: Secondary | ICD-10-CM | POA: Diagnosis not present

## 2018-04-25 DIAGNOSIS — I10 Essential (primary) hypertension: Secondary | ICD-10-CM | POA: Diagnosis not present

## 2018-04-25 DIAGNOSIS — E782 Mixed hyperlipidemia: Secondary | ICD-10-CM | POA: Diagnosis not present

## 2018-04-25 DIAGNOSIS — R69 Illness, unspecified: Secondary | ICD-10-CM | POA: Diagnosis not present

## 2018-05-01 DIAGNOSIS — E1169 Type 2 diabetes mellitus with other specified complication: Secondary | ICD-10-CM | POA: Diagnosis not present

## 2018-05-01 DIAGNOSIS — D529 Folate deficiency anemia, unspecified: Secondary | ICD-10-CM | POA: Diagnosis not present

## 2018-05-01 DIAGNOSIS — D519 Vitamin B12 deficiency anemia, unspecified: Secondary | ICD-10-CM | POA: Diagnosis not present

## 2018-05-01 DIAGNOSIS — D500 Iron deficiency anemia secondary to blood loss (chronic): Secondary | ICD-10-CM | POA: Diagnosis not present

## 2018-05-01 DIAGNOSIS — D649 Anemia, unspecified: Secondary | ICD-10-CM | POA: Diagnosis not present

## 2018-05-01 DIAGNOSIS — N182 Chronic kidney disease, stage 2 (mild): Secondary | ICD-10-CM | POA: Diagnosis not present

## 2018-05-01 DIAGNOSIS — R42 Dizziness and giddiness: Secondary | ICD-10-CM | POA: Diagnosis not present

## 2018-05-01 DIAGNOSIS — K219 Gastro-esophageal reflux disease without esophagitis: Secondary | ICD-10-CM | POA: Diagnosis not present

## 2018-05-01 DIAGNOSIS — I10 Essential (primary) hypertension: Secondary | ICD-10-CM | POA: Diagnosis not present

## 2018-05-18 ENCOUNTER — Other Ambulatory Visit (HOSPITAL_COMMUNITY): Payer: Self-pay | Admitting: Cardiology

## 2018-05-20 ENCOUNTER — Other Ambulatory Visit: Payer: Self-pay | Admitting: *Deleted

## 2018-05-20 MED ORDER — HYDRALAZINE HCL 25 MG PO TABS: 25.0000 mg | ORAL_TABLET | Freq: Two times a day (BID) | ORAL | 3 refills | Status: DC

## 2018-05-24 DIAGNOSIS — I10 Essential (primary) hypertension: Secondary | ICD-10-CM | POA: Diagnosis not present

## 2018-05-24 DIAGNOSIS — E1165 Type 2 diabetes mellitus with hyperglycemia: Secondary | ICD-10-CM | POA: Diagnosis not present

## 2018-06-06 DIAGNOSIS — Z9841 Cataract extraction status, right eye: Secondary | ICD-10-CM | POA: Diagnosis not present

## 2018-06-06 DIAGNOSIS — Z9842 Cataract extraction status, left eye: Secondary | ICD-10-CM | POA: Diagnosis not present

## 2018-06-06 DIAGNOSIS — H401112 Primary open-angle glaucoma, right eye, moderate stage: Secondary | ICD-10-CM | POA: Diagnosis not present

## 2018-06-06 DIAGNOSIS — Z961 Presence of intraocular lens: Secondary | ICD-10-CM | POA: Diagnosis not present

## 2018-06-06 DIAGNOSIS — H401121 Primary open-angle glaucoma, left eye, mild stage: Secondary | ICD-10-CM | POA: Diagnosis not present

## 2018-06-11 DIAGNOSIS — R69 Illness, unspecified: Secondary | ICD-10-CM | POA: Diagnosis not present

## 2018-06-18 DIAGNOSIS — Z006 Encounter for examination for normal comparison and control in clinical research program: Secondary | ICD-10-CM

## 2018-06-18 NOTE — Research (Signed)
XIENCE 28 Research Study 6 Month Follow-up: 10:15  Spoke with patient and his wife. No chest pain or shortness of breath with the patient, he has had an increase in BP over the past several days. Family MD added Spironolactone 25 MG 2 x day to medication regimen and increased Hydralazine to 50 MG 2 x day per patient. Patient states will follow up with Dr Domenic Polite with Cardiology 06/26/2018.  I will follow up with the patient on 06/27/2018.

## 2018-06-24 ENCOUNTER — Encounter: Payer: Self-pay | Admitting: *Deleted

## 2018-06-24 ENCOUNTER — Telehealth: Payer: Self-pay | Admitting: *Deleted

## 2018-06-24 DIAGNOSIS — E1122 Type 2 diabetes mellitus with diabetic chronic kidney disease: Secondary | ICD-10-CM | POA: Diagnosis not present

## 2018-06-24 DIAGNOSIS — I10 Essential (primary) hypertension: Secondary | ICD-10-CM | POA: Diagnosis not present

## 2018-06-24 DIAGNOSIS — E782 Mixed hyperlipidemia: Secondary | ICD-10-CM | POA: Diagnosis not present

## 2018-06-24 NOTE — Telephone Encounter (Signed)
Attempted to reach patient for review of chart for virtual visit (telephone) on Wednesday, 06/26/2018 with Dr. Domenic Polite at 9:00 am.    Left message to return call.

## 2018-06-24 NOTE — Telephone Encounter (Signed)
Chart reviewed with patient.  Patient reminded to have weight and vitals ready before the phone call begins.    The patient verbally consented for a telehealth phone visit with [facility] and understands that his/her insurance company will be billed for the encounter.  Patient stated that he did have labs since last OV with pcp - requested now.  No hospital visits.

## 2018-06-25 NOTE — Progress Notes (Signed)
Virtual Visit via Telephone Note    Evaluation Performed:  Follow-up visit  This visit type was conducted due to national recommendations for restrictions regarding the COVID-19 Pandemic (e.g. social distancing).  This format is felt to be most appropriate for this patient at this time.  All issues noted in this document were discussed and addressed.  No physical exam was performed (except for noted visual exam findings with Video Visits).  The patient verbally consented for a telehealth phone visit today with [facility] and understands that their insurance company will be billed for the encounter.  Date:  06/26/2018   ID:  [name] [name], DOB [birth year], MRN [id number]  Patient Location:  [address] [place], [place] [address]  Provider location:   [facility] [place], [place], [place] [address] Phone: [phone]; Fax: [phone]  PCP:  Caryl Bis, MD  Cardiologist:  Rozann Lesches, MD  Chief Complaint: Follow-up angina control  History of Present Illness:    [name] [name] is a 78 y.o. male who presents via audio conferencing for a telehealth visit today.  He was last seen in December 2019.  I spoke with him on the phone with his wife as well.  He tells me that he is doing fine, reports no angina symptoms or nitroglycerin use.  He is walking 3 miles a day, 6 days a week.  He reports NYHA class II dyspnea, no palpitations or syncope.  Patient recently had hydralazine increased to 50 mg twice daily with addition of Aldactone 25 mg twice daily by PCP.  I reviewed recent home blood pressure and heart rate checks as detailed below.  Recent lab work from February is outlined below.  Cholesterol has been well controlled.  The patient does not have symptoms concerning for COVID-19 infection (fever, chills, cough, or new shortness of breath).  We discussed general protective measures and need for social distancing.  Prior CV studies:   The  following studies were reviewed today:  Echocardiogram 01/02/2018: Study Conclusions  - Left ventricle: The cavity size was normal. There was severe   focal basal hypertrophy. Systolic function was normal. The   estimated ejection fraction was in the range of 55% to 60%. Wall   motion was normal; there were no regional wall motion   abnormalities. There was an increased relative contribution of   atrial contraction to ventricular filling. Doppler parameters are   consistent with abnormal left ventricular relaxation (grade 1   diastolic dysfunction). - Mitral valve: Calcified annulus. Valve area by pressure   half-time: 1.59 cm^2. - Left atrium: The atrium was mildly dilated. - Pulmonic valve: There was trivial regurgitation. - Pulmonary arteries: Systolic pressure could not be accurately   estimated. - Pericardium, extracardiac: A trivial pericardial effusion was   identified posterior to the heart.  Cardiac catheterization 01/01/2018:  Mid Cx lesion is 95% stenosed.  Prox LAD lesion is 25% stenosed.  Mid LAD lesion is 30% stenosed.  Prox RCA to Mid RCA lesion is 50% stenosed.  The left ventricular systolic function is normal.  LV end diastolic pressure is normal. LVEDP 12 mm Hg.  The left ventricular ejection fraction is 50-55% by visual estimate.  There is no aortic valve stenosis.  Severe tortuosity in the right subclavian which makes torquing catheters difficult. Would consider femoral approach in the future, particularly if emergent cardiac cath was needed.  A drug-eluting stent was successfully placed using a STENT SIERRA 3.25  X 15 MM.  Post intervention, there is a 0% residual stenosis.   Recommend uninterrupted dual antiplatelet therapy with Aspirin 81mg  daily and Ticagrelor 90mg  twice daily for a minimum of 12 months (ACS - Class I recommendation).   ECG 02/01/2018: I personally reviewed the tracing which shows normal sinus rhythm.  Past Medical History:   Diagnosis Date  . Anemia   . Arthritis   . CAD (coronary artery disease)    DES to circumflex 12/2017  . Gout   . Headache   . History of kidney stones   . Hypertension   . NSTEMI (non-ST elevated myocardial infarction) ([facility]) 12/31/2017  . Paroxysmal atrial fibrillation ([facility])   . Pericardial effusion    Idiopathic, recurrent pericardial effusion s/p pericardial window.  . Prostate cancer ([facility]) 2007   S/P Seed implants   . Supraventricular tachycardia ([facility])   . Temporal arteritis [facility])    Past Surgical History:  Procedure Laterality Date  . CATARACT EXTRACTION W/PHACO Left 06/07/2015   Procedure: CATARACT EXTRACTION PHACO AND INTRAOCULAR LENS PLACEMENT LEFT EYE CDE=8.00;  Surgeon: Tonny Branch, MD;  Location: [facility];  Service: Ophthalmology;  Laterality: Left;  . CATARACT EXTRACTION W/PHACO Right 06/17/2015   Procedure: CATARACT EXTRACTION PHACO AND INTRAOCULAR LENS PLACEMENT RIGHT EYE CDE=11.09;  Surgeon: Tonny Branch, MD;  Location: [facility];  Service: Ophthalmology;  Laterality: Right;  . CORONARY ANGIOPLASTY WITH STENT PLACEMENT  01/01/2018  . CORONARY STENT INTERVENTION N/A 01/01/2018   Procedure: CORONARY STENT INTERVENTION;  Surgeon: Jettie Booze, MD;  Location: [facility];  Service: Cardiovascular;  Laterality: N/A;  . FRACTURE SURGERY    . INSERTION PROSTATE RADIATION SEED  2007  . KNEE ARTHROSCOPY Left   . LEFT HEART CATH AND CORONARY ANGIOGRAPHY N/A 01/01/2018   Procedure: LEFT HEART CATH AND CORONARY ANGIOGRAPHY;  Surgeon: Jettie Booze, MD;  Location: [facility];  Service: Cardiovascular;  Laterality: N/A;  . ORIF TIBIA & FIBULA FRACTURES Left 2003   Distal tibial/fibula   . PERICARDIAL WINDOW  December 2008  . PERICARDIOCENTESIS  2007   hx/notes 10/19/2011     Current Meds  Medication Sig  . allopurinol (ZYLOPRIM) 300 MG tablet Take 450 mg by mouth daily.   Marland Kitchen aspirin 81 MG tablet Take 81 mg by mouth daily.  Marland Kitchen atorvastatin (LIPITOR) 80 MG  tablet TAKE 1 TABLET BY MOUTH ONCE DAILY AT  6PM  . benazepril (LOTENSIN) 40 MG tablet Take 40 mg by mouth daily.  . clopidogrel (PLAVIX) 75 MG tablet Take 1 tablet (75 mg total) by mouth daily.  . ferrous sulfate 325 (65 FE) MG tablet Take 325 mg by mouth 2 (two) times daily.  . hydrALAZINE (APRESOLINE) 100 MG tablet Take 100 mg by mouth 2 (two) times daily.  . hydrochlorothiazide (HYDRODIURIL) 12.5 MG tablet Take 12.5 mg by mouth daily. for high blood pressure  . metoprolol succinate (TOPROL-XL) 50 MG 24 hr tablet TAKE 1 TABLET BY MOUTH ONCE DAILY WITH FOOD OR  IMMEDIATELY  FOLLOWING  THE  SAME  MEAL  . Multiple Vitamin (MULTIVITAMIN) tablet Take 1 tablet by mouth daily.  . nitroGLYCERIN (NITROSTAT) 0.4 MG SL tablet Place 1 tablet (0.4 mg total) under the tongue every 5 (five) minutes x 3 doses as needed for chest pain.  . pantoprazole (PROTONIX) 40 MG tablet TAKE 1 TABLET BY MOUTH ONCE DAILY (Patient taking differently: Take 40 mg by mouth 2 (two) times daily. )  . spironolactone (ALDACTONE) 25 MG tablet Take  25 mg by mouth 2 (two) times daily.  . vitamin C (ASCORBIC ACID) 500 MG tablet Take 500 mg by mouth 2 (two) times daily.      Allergies:   Amiodarone    Social History   Tobacco Use  . Smoking status: Former Smoker    Packs/day: 0.30    Years: 10.00    Pack years: 3.00    Types: Cigarettes    Start date: 08/06/1958    Last attempt to quit: 1968    Years since quitting: 52.2  . Smokeless tobacco: Never Used  Substance Use Topics  . Alcohol use: Not Currently  . Drug use: Never     Family Hx: The patient's family history includes CAD in his mother; Lung cancer in his brother; Prostate cancer in his brother and father.  ROS:   Please see the history of present illness.    All other systems reviewed and are negative.   Labs/Other Tests and Data Reviewed:    Recent Labs: 12/31/2017: ALT 18; B Natriuretic Peptide 210.0; Magnesium 2.1; TSH 5.639 01/02/2018: BUN 13;  Creatinine, Ser 0.84; Hemoglobin 11.1; Platelets 182; Potassium 3.4; Sodium 136   February 2020: Hemoglobin 11.9, platelets 179, cholesterol 94, triglycerides 54, HDL 41, LDL 42, BUN 14, creatinine 0.97, potassium 4.3, AST 32, ALT 27, hemoglobin A1c 6.7%, TSH 3.5  Wt Readings from Last 3 Encounters:  06/26/18 211 lb (95.7 kg)  03/15/18 225 lb (102.1 kg)  02/01/18 221 lb 12.8 oz (100.6 kg)     Objective:    Vital Signs:  BP 134/69   Pulse 63   Ht 6' (1.829 m)   Wt 211 lb (95.7 kg)   BMI 28.62 kg/m    Measurements 2 days prior to today: BP 133/64  HR 70 BP 138/68  HR 65  ASSESSMENT & PLAN:    1.  CAD status post DES to the mid circumflex in October 2019.  He is doing well without active angina and reports good exercise tolerance, walking 3 miles most days of the week.  Continue current medical regimen including dual antiplatelet therapy.  2.  Mixed hyperlipidemia, recent LDL 42 on Lipitor.  LFTs normal.  3.  Essential hypertension, medication adjustments are noted above.  Recent blood pressure control is adequate.  4.  History of paroxysmal atrial fibrillation.  He is no longer on antiarrhythmic therapy, reports no palpitations, continues on beta-blocker.  He has declined anticoagulation so far.  COVID-19 Education: The signs and symptoms of COVID-19 were discussed with the patient and how to seek care for testing (follow up with PCP or arrange E-visit).  The importance of social distancing was discussed today.  Patient Risk:   After full review of this patient's clinical status, I feel that they are at least moderate risk at this time.  Time:   Today, I have spent 6 minutes with the patient with telehealth technology discussing control of cardiac symptoms, medical regimen.     Medication Adjustments/Labs and Tests Ordered: Current medicines are reviewed at length with the patient today.  Concerns regarding medicines are outlined above.  Tests Ordered: No orders of the  defined types were placed in this encounter.  Medication Changes: No orders of the defined types were placed in this encounter.   Disposition:  Follow up 6 months  Signed, Rozann Lesches, MD  06/26/2018 9:21 AM    [facility]

## 2018-06-26 ENCOUNTER — Telehealth (INDEPENDENT_AMBULATORY_CARE_PROVIDER_SITE_OTHER): Payer: Medicare HMO | Admitting: Cardiology

## 2018-06-26 VITALS — BP 134/69 | HR 63 | Ht 72.0 in | Wt 211.0 lb

## 2018-06-26 DIAGNOSIS — I10 Essential (primary) hypertension: Secondary | ICD-10-CM | POA: Diagnosis not present

## 2018-06-26 DIAGNOSIS — Z79899 Other long term (current) drug therapy: Secondary | ICD-10-CM | POA: Diagnosis not present

## 2018-06-26 DIAGNOSIS — I480 Paroxysmal atrial fibrillation: Secondary | ICD-10-CM

## 2018-06-26 DIAGNOSIS — E782 Mixed hyperlipidemia: Secondary | ICD-10-CM | POA: Diagnosis not present

## 2018-06-26 DIAGNOSIS — I2510 Atherosclerotic heart disease of native coronary artery without angina pectoris: Secondary | ICD-10-CM

## 2018-06-26 DIAGNOSIS — Z8679 Personal history of other diseases of the circulatory system: Secondary | ICD-10-CM | POA: Diagnosis not present

## 2018-06-26 DIAGNOSIS — I25119 Atherosclerotic heart disease of native coronary artery with unspecified angina pectoris: Secondary | ICD-10-CM

## 2018-06-26 NOTE — Patient Instructions (Signed)

## 2018-07-01 ENCOUNTER — Other Ambulatory Visit: Payer: Self-pay | Admitting: Cardiovascular Disease

## 2018-07-01 MED ORDER — ATORVASTATIN CALCIUM 80 MG PO TABS: ORAL_TABLET | ORAL | 1 refills | Status: DC

## 2018-07-01 MED ORDER — BENAZEPRIL HCL 40 MG PO TABS: 40.0000 mg | ORAL_TABLET | Freq: Every day | ORAL | 1 refills | Status: DC

## 2018-07-01 NOTE — Telephone Encounter (Signed)
Medication sent to pharmacy  

## 2018-07-01 NOTE — Telephone Encounter (Signed)
°*  STAT* If patient is at the pharmacy, call can be transferred to refill team.   1. Which medications need to be refilled?benazepril (LOTENSIN) 40 MG tablet   atorvastatin (LIPITOR) 80 MG tablet     2. Which pharmacy/location (including street and city if local pharmacy) is medication to be sent to? [facility] in [place]   3. Do they need a 30 day or 90 day supply? [place]

## 2018-07-03 DIAGNOSIS — D649 Anemia, unspecified: Secondary | ICD-10-CM | POA: Diagnosis not present

## 2018-07-03 DIAGNOSIS — R739 Hyperglycemia, unspecified: Secondary | ICD-10-CM | POA: Diagnosis not present

## 2018-07-03 DIAGNOSIS — N182 Chronic kidney disease, stage 2 (mild): Secondary | ICD-10-CM | POA: Diagnosis not present

## 2018-07-03 DIAGNOSIS — D519 Vitamin B12 deficiency anemia, unspecified: Secondary | ICD-10-CM | POA: Diagnosis not present

## 2018-07-03 DIAGNOSIS — E1165 Type 2 diabetes mellitus with hyperglycemia: Secondary | ICD-10-CM | POA: Diagnosis not present

## 2018-07-03 DIAGNOSIS — D529 Folate deficiency anemia, unspecified: Secondary | ICD-10-CM | POA: Diagnosis not present

## 2018-07-24 ENCOUNTER — Other Ambulatory Visit: Payer: Self-pay

## 2018-07-24 ENCOUNTER — Encounter (HOSPITAL_COMMUNITY): Payer: Self-pay | Admitting: Emergency Medicine

## 2018-07-24 ENCOUNTER — Emergency Department (HOSPITAL_COMMUNITY)
Admission: EM | Admit: 2018-07-24 | Discharge: 2018-07-25 | Disposition: A | Discharge status: Home / Self Care | Payer: Medicare HMO | Attending: Emergency Medicine | Admitting: Emergency Medicine

## 2018-07-24 DIAGNOSIS — D649 Anemia, unspecified: Secondary | ICD-10-CM | POA: Diagnosis not present

## 2018-07-24 DIAGNOSIS — I480 Paroxysmal atrial fibrillation: Secondary | ICD-10-CM | POA: Insufficient documentation

## 2018-07-24 DIAGNOSIS — I252 Old myocardial infarction: Secondary | ICD-10-CM | POA: Diagnosis not present

## 2018-07-24 DIAGNOSIS — I10 Essential (primary) hypertension: Secondary | ICD-10-CM | POA: Diagnosis not present

## 2018-07-24 DIAGNOSIS — Z7982 Long term (current) use of aspirin: Secondary | ICD-10-CM | POA: Diagnosis not present

## 2018-07-24 DIAGNOSIS — R7309 Other abnormal glucose: Secondary | ICD-10-CM

## 2018-07-24 DIAGNOSIS — Z7902 Long term (current) use of antithrombotics/antiplatelets: Secondary | ICD-10-CM | POA: Insufficient documentation

## 2018-07-24 DIAGNOSIS — I259 Chronic ischemic heart disease, unspecified: Secondary | ICD-10-CM | POA: Insufficient documentation

## 2018-07-24 DIAGNOSIS — I4892 Unspecified atrial flutter: Secondary | ICD-10-CM | POA: Diagnosis not present

## 2018-07-24 DIAGNOSIS — R739 Hyperglycemia, unspecified: Secondary | ICD-10-CM | POA: Diagnosis not present

## 2018-07-24 DIAGNOSIS — Z87891 Personal history of nicotine dependence: Secondary | ICD-10-CM | POA: Insufficient documentation

## 2018-07-24 DIAGNOSIS — R079 Chest pain, unspecified: Secondary | ICD-10-CM | POA: Diagnosis present

## 2018-07-24 DIAGNOSIS — Z79899 Other long term (current) drug therapy: Secondary | ICD-10-CM | POA: Insufficient documentation

## 2018-07-24 DIAGNOSIS — E1122 Type 2 diabetes mellitus with diabetic chronic kidney disease: Secondary | ICD-10-CM | POA: Diagnosis not present

## 2018-07-24 DIAGNOSIS — I2510 Atherosclerotic heart disease of native coronary artery without angina pectoris: Secondary | ICD-10-CM | POA: Diagnosis not present

## 2018-07-24 LAB — CBC WITH DIFFERENTIAL/PLATELET
Abs Immature Granulocytes: 0.03 10*3/uL (ref 0.00–0.07)
Basophils Absolute: 0.1 10*3/uL (ref 0.0–0.1)
Basophils Relative: 1 %
Eosinophils Absolute: 0.1 10*3/uL (ref 0.0–0.5)
Eosinophils Relative: 2 %
HCT: 33.1 % — ABNORMAL LOW (ref 39.0–52.0)
Hemoglobin: 10.8 g/dL — ABNORMAL LOW (ref 13.0–17.0)
Immature Granulocytes: 1 %
Lymphocytes Relative: 27 %
Lymphs Abs: 1.7 10*3/uL (ref 0.7–4.0)
MCH: 30.1 pg (ref 26.0–34.0)
MCHC: 32.6 g/dL (ref 30.0–36.0)
MCV: 92.2 fL (ref 80.0–100.0)
Monocytes Absolute: 0.7 10*3/uL (ref 0.1–1.0)
Monocytes Relative: 11 %
Neutro Abs: 3.6 10*3/uL (ref 1.7–7.7)
Neutrophils Relative %: 58 %
Platelets: 180 10*3/uL (ref 150–400)
RBC: 3.59 MIL/uL — ABNORMAL LOW (ref 4.22–5.81)
RDW: 16.2 % — ABNORMAL HIGH (ref 11.5–15.5)
WBC: 6.1 10*3/uL (ref 4.0–10.5)
nRBC: 0 % (ref 0.0–0.2)

## 2018-07-24 MED ORDER — PROPOFOL 10 MG/ML IV BOLUS
0.5000 mg/kg | Freq: Once | INTRAVENOUS | Status: AC
  Administered 2018-07-25: 48.8 mg via INTRAVENOUS
  Filled 2018-07-24: qty 20

## 2018-07-24 MED ORDER — APIXABAN 5 MG PO TABS
5.0000 mg | ORAL_TABLET | Freq: Once | ORAL | Status: AC
  Administered 2018-07-25: 5 mg via ORAL
  Filled 2018-07-24: qty 1

## 2018-07-24 NOTE — ED Triage Notes (Signed)
Pt CO "not feeling right for about 1 week." Tonight pt states he was laying down to go to sleep and "it felt like my heart was racing." Pt denies chest pain at this time. Pt states this has happened before "years ago."

## 2018-07-24 NOTE — ED Provider Notes (Addendum)
[name] EMERGENCY DEPARTMENT Provider Note   CSN: [id number] Arrival date & time: 07/24/18  2307    History   Chief Complaint Chief Complaint  Patient presents with   Chest Pain    HPI [name] [name] is a 78 y.o. male.   The history is provided by the patient.  Chest Pain  He has history of hypertension, coronary artery disease, paroxysmal atrial fibrillation and comes in because of rapid heart rate.  He states that he had been doing some work in the yard today and he was a little bit more fatigued than normal.  This evening, he noted that his heart was racing.  He checked his blood pressure and his heart rate was noted to be over 130.  He denies dyspnea and denies chest pain.  He is not currently on any anticoagulants.  Past Medical History:  Diagnosis Date   Anemia    Arthritis    CAD (coronary artery disease)    DES to circumflex 12/2017   Gout    Headache    History of kidney stones    Hypertension    NSTEMI (non-ST elevated myocardial infarction) ([place]) 12/31/2017   Paroxysmal atrial fibrillation (HCC)    Pericardial effusion    Idiopathic, recurrent pericardial effusion s/p pericardial window.   Prostate cancer ([place]) 2007   S/P Seed implants    Supraventricular tachycardia ([place])    Temporal arteritis [facility])     Patient Active Problem List   Diagnosis Date Noted   Status post coronary artery stent placement    Hyperlipidemia 01/02/2018   NSTEMI (non-ST elevated myocardial infarction) ([place]) 12/31/2017   Hypertensive urgency    PAF (paroxysmal atrial fibrillation) (HCC)    Sinus bradycardia 01/14/2014   Pericardial effusion 12/31/2013   Paroxysmal atrial fibrillation (HCC)    Supraventricular tachycardia ([place]) 10/17/2010    Past Surgical History:  Procedure Laterality Date   CATARACT EXTRACTION W/PHACO Left 06/07/2015   Procedure: CATARACT EXTRACTION PHACO AND INTRAOCULAR LENS PLACEMENT LEFT EYE CDE=8.00;  Surgeon: Tonny Branch, MD;   Location: [facility];  Service: Ophthalmology;  Laterality: Left;   CATARACT EXTRACTION W/PHACO Right 06/17/2015   Procedure: CATARACT EXTRACTION PHACO AND INTRAOCULAR LENS PLACEMENT RIGHT EYE CDE=11.09;  Surgeon: Tonny Branch, MD;  Location: [facility];  Service: Ophthalmology;  Laterality: Right;   CORONARY ANGIOPLASTY WITH STENT PLACEMENT  01/01/2018   CORONARY STENT INTERVENTION N/A 01/01/2018   Procedure: CORONARY STENT INTERVENTION;  Surgeon: Jettie Booze, MD;  Location: [place] CV LAB;  Service: Cardiovascular;  Laterality: N/A;   FRACTURE SURGERY     INSERTION PROSTATE RADIATION SEED  2007   KNEE ARTHROSCOPY Left    LEFT HEART CATH AND CORONARY ANGIOGRAPHY N/A 01/01/2018   Procedure: LEFT HEART CATH AND CORONARY ANGIOGRAPHY;  Surgeon: Jettie Booze, MD;  Location: [place] CV LAB;  Service: Cardiovascular;  Laterality: N/A;   ORIF TIBIA & FIBULA FRACTURES Left 2003   Distal tibial/fibula    PERICARDIAL WINDOW  December 2008   PERICARDIOCENTESIS  2007   hx/notes 10/19/2011        Home Medications    Prior to Admission medications   Medication Sig Start Date End Date Taking? Authorizing Provider  allopurinol (ZYLOPRIM) 300 MG tablet Take 450 mg by mouth daily.    Yes [provider]  aspirin 81 MG tablet Take 81 mg by mouth daily.   Yes [provider]  atorvastatin (LIPITOR) 80 MG tablet TAKE 1 TABLET BY MOUTH ONCE DAILY  AT  6PM 07/01/18  Yes Satira Sark, MD  benazepril (LOTENSIN) 40 MG tablet Take 1 tablet (40 mg total) by mouth daily. 07/01/18  Yes Satira Sark, MD  clopidogrel (PLAVIX) 75 MG tablet Take 1 tablet (75 mg total) by mouth daily. 02/01/18  Yes Satira Sark, MD  ferrous sulfate 325 (65 FE) MG tablet Take 325 mg by mouth 2 (two) times daily.   Yes [provider]  hydrALAZINE (APRESOLINE) 100 MG tablet Take 100 mg by mouth 2 (two) times daily.   Yes [provider]  hydrochlorothiazide  (HYDRODIURIL) 12.5 MG tablet Take 12.5 mg by mouth daily. for high blood pressure 12/28/17  Yes [provider]  metoprolol succinate (TOPROL-XL) 50 MG 24 hr tablet TAKE 1 TABLET BY MOUTH ONCE DAILY WITH FOOD OR  IMMEDIATELY  FOLLOWING  THE  SAME  MEAL 01/24/18  Yes Satira Sark, MD  Multiple Vitamin (MULTIVITAMIN) tablet Take 1 tablet by mouth daily.   Yes [provider]  pantoprazole (PROTONIX) 40 MG tablet TAKE 1 TABLET BY MOUTH ONCE DAILY Patient taking differently: Take 40 mg by mouth 2 (two) times daily.  12/12/17  Yes Satira Sark, MD  spironolactone (ALDACTONE) 25 MG tablet Take 25 mg by mouth 2 (two) times daily.   Yes [provider]  vitamin C (ASCORBIC ACID) 500 MG tablet Take 500 mg by mouth 2 (two) times daily.    Yes [provider]  nitroGLYCERIN (NITROSTAT) 0.4 MG SL tablet Place 1 tablet (0.4 mg total) under the tongue every 5 (five) minutes x 3 doses as needed for chest pain. 01/02/18   Cheryln Manly, NP    Family History Family History  Problem Relation Age of Onset   CAD Mother        MI in 30s   Prostate cancer Father    Prostate cancer Brother    Lung cancer Brother     Social History Social History   Tobacco Use   Smoking status: Former Smoker    Packs/day: 0.30    Years: 10.00    Pack years: 3.00    Types: Cigarettes    Start date: 08/06/1958    Last attempt to quit: 1968    Years since quitting: 52.3   Smokeless tobacco: Never Used  Substance Use Topics   Alcohol use: Not Currently   Drug use: Never     Allergies   Amiodarone   Review of Systems Review of Systems  Cardiovascular: Positive for chest pain.  All other systems reviewed and are negative.    Physical Exam Updated Vital Signs BP 116/77 (BP Location: Right Arm)    Pulse (!) 116    Temp 98.6 F (37 C) (Oral)    Resp 15    SpO2 96%   Physical Exam Vitals signs and nursing note reviewed.    78 year old male, resting  comfortably and in no acute distress. Vital signs are significant for elevated heart rate. Oxygen saturation is 96%, which is normal. Head is normocephalic and atraumatic. PERRLA, EOMI. Oropharynx is clear. Neck is nontender and supple without adenopathy or JVD. Back is nontender and there is no CVA tenderness. Lungs are clear without rales, wheezes, or rhonchi. Chest is nontender. Heart is tachycardic and irregular without murmur. Abdomen is soft, flat, nontender without masses or hepatosplenomegaly and peristalsis is normoactive. Extremities have no cyanosis or edema, full range of motion is present. Skin is warm and dry without rash. Neurologic:  Mental status is normal, cranial nerves are intact, there are no motor or sensory deficits.  ED Treatments / Results  Labs (all labs ordered are listed, but only abnormal results are displayed) Labs Reviewed  BASIC METABOLIC PANEL - Abnormal; Notable for the following components:      Result Value   Glucose, Bld 173 (*)    BUN 31 (*)    All other components within normal limits  CBC WITH DIFFERENTIAL/PLATELET - Abnormal; Notable for the following components:   RBC 3.59 (*)    Hemoglobin 10.8 (*)    HCT 33.1 (*)    RDW 16.2 (*)    All other components within normal limits  MAGNESIUM    EKG EKG Interpretation  Date/Time:  Wednesday July 24 2018 23:20:08 EDT Ventricular Rate:  137 PR Interval:    QRS Duration: 96 QT Interval:  328 QTC Calculation: 496 R Axis:   28 Text Interpretation:  Atrial fibrillation Borderline low voltage, extremity leads Abnormal R-wave progression, early transition ST depression, probably rate related Borderline prolonged QT interval When compared with ECG of 01/03/2018, Atrial fibrillation with rapid ventricular response has replaced Sinus rhythm Nonspecific T wave abnormality is no longer present Confirmed by Delora Fuel ([id number]) on 07/24/2018 11:37:14 PM   EKG Interpretation  Date/Time:  Thursday July 25 2018 00:48:46 EDT Ventricular Rate:  75 PR Interval:    QRS Duration: 100 QT Interval:  374 QTC Calculation: 418 R Axis:   -6 Text Interpretation:  Sinus rhythm Probable left atrial enlargement Low voltage, precordial leads Abnormal R-wave progression, early transition When compared with ECG of 07/24/2018, Sinus rhythm has replaced Atrial fibrillation with rapid ventricular response ST depression is no longer present Confirmed by Delora Fuel ([id number]) on 07/25/2018 12:55:22 AM       Procedures .Sedation Date/Time: 07/25/2018 12:45 AM Performed by: Delora Fuel, MD Authorized by: Delora Fuel, MD   Consent:    Consent obtained:  Verbal   Consent given by:  Patient   Risks discussed:  Allergic reaction, dysrhythmia, inadequate sedation, nausea, prolonged hypoxia resulting in organ damage, prolonged sedation necessitating reversal, respiratory compromise necessitating ventilatory assistance and intubation and vomiting   Alternatives discussed:  Analgesia without sedation, anxiolysis and regional anesthesia Universal protocol:    Procedure explained and questions answered to patient or proxy's satisfaction: yes     Relevant documents present and verified: yes     Test results available and properly labeled: yes     Imaging studies available: yes     Required blood products, implants, devices, and special equipment available: yes     Site/side marked: yes     Immediately prior to procedure a time out was called: yes     Patient identity confirmation method:  Verbally with patient Indications:    Procedure performed:  Cardioversion   Procedure necessitating sedation performed by:  Physician performing sedation Pre-sedation assessment:    Time since last food or drink:  4 hours   ASA classification: class 2 - patient with mild systemic disease     Neck mobility: normal     Mouth opening:  3 or more finger widths   Thyromental distance:  4 finger widths   Mallampati score:  I - soft  palate, uvula, fauces, pillars visible   Pre-sedation assessments completed and reviewed: airway patency, cardiovascular function, hydration status, mental status, nausea/vomiting, pain level, respiratory function and temperature   Immediate pre-procedure details:    Reassessment: Patient reassessed immediately prior to procedure  Reviewed: vital signs, relevant labs/tests and NPO status     Verified: bag valve mask available, emergency equipment available, intubation equipment available, IV patency confirmed, oxygen available and suction available   Procedure details (see MAR for exact dosages):    Preoxygenation:  Nasal cannula   Sedation:  Propofol   Intra-procedure monitoring:  Blood pressure monitoring, cardiac monitor, continuous pulse oximetry, frequent LOC assessments, frequent vital sign checks and continuous capnometry   Intra-procedure events: none     Total Provider sedation time (minutes):  30 Post-procedure details:    Post-sedation assessment completed:  07/25/2018 1:15 AM   Attendance: Constant attendance by certified staff until patient recovered     Recovery: Patient returned to pre-procedure baseline     Post-sedation assessments completed and reviewed: airway patency, cardiovascular function, hydration status, mental status, nausea/vomiting, pain level, respiratory function and temperature     Patient is stable for discharge or admission: yes     Patient tolerance:  Tolerated well, no immediate complications .Cardioversion Date/Time: 07/25/2018 12:51 AM Performed by: Delora Fuel, MD Authorized by: Delora Fuel, MD   Consent:    Consent obtained:  Written   Consent given by:  Patient   Risks discussed:  Cutaneous burn, death and induced arrhythmia   Alternatives discussed:  Rate-control medication Pre-procedure details:    Cardioversion basis:  Emergent   Rhythm:  Atrial fibrillation   Electrode placement:  Anterior-posterior Patient sedated: Yes. Refer to  sedation procedure documentation for details of sedation.  Attempt one:    Cardioversion mode:  Synchronous   Waveform:  Biphasic   Shock (joules) attempt one: 120.   Shock outcome:  Conversion to normal sinus rhythm Post-procedure details:    Patient status:  Awake   Patient tolerance of procedure:  Tolerated well, no immediate complications    CRITICAL CARE Performed by: Delora Fuel Total critical care time: 35 minutes Critical care time was exclusive of separately billable procedures and treating other patients. Critical care was necessary to treat or prevent imminent or life-threatening deterioration. Critical care was time spent personally by me on the following activities: development of treatment plan with patient and/or surrogate as well as nursing, discussions with consultants, evaluation of patient's response to treatment, examination of patient, obtaining history from patient or surrogate, ordering and performing treatments and interventions, ordering and review of laboratory studies, ordering and review of radiographic studies, pulse oximetry and re-evaluation of patient's condition.  Medications Ordered in ED Medications  propofol (DIPRIVAN) 10 mg/mL bolus/IV push 48.8 mg (48.8 mg Intravenous Given 07/25/18 0021)  apixaban (ELIQUIS) tablet 5 mg (5 mg Oral Given 07/25/18 0020)  propofol (DIPRIVAN) 10 mg/mL bolus/IV push (48.75 mg Intravenous Given 07/25/18 0049)     Initial Impression / Assessment and Plan / ED Course  I have reviewed the triage vital signs and the nursing notes.  Pertinent labs & imaging results that were available during my care of the patient were reviewed by me and considered in my medical decision making (see chart for details).  Paroxysmal atrial fibrillation.  Per patient's history, rapid heart rate started tonight, so he is a candidate for immediate cardioversion.  Old records are reviewed, and he had been admitted to the hospital in 2015 with  diagnosis of atrial fibrillation.  Decision was made to use aspirin rather than anticoagulation at that time based on relative risk of stroke versus bleeding complications.  CHA2DS2/VAS score was 2 at that point.  CHA2DS2/VAS score now is 5, so he clearly needs to  be on anticoagulation.  He is started on apixaban.  Labs show stable anemia.  Glucose is elevated slightly higher than baseline, will need to be followed as an outpatient.  He was successfully cardioverted to sinus rhythm.  He will be observed in the ED with plan to discharge home with ambulatory referral to atrial fibrillation clinic.  Heart rhythm has been stable, and he is discharged.  CHA2DS2/VAS Stroke Risk Points  Current as of 7 minutes ago     5 >= 2 Points: High Risk  1 - 1.99 Points: Medium Risk  0 Points: Low Risk    The previous score was 4 on 01/07/2018.:  Last Change:     Details    This score determines the patient's risk of having a stroke if the  patient has atrial fibrillation.       Points Metrics  1 Has Congestive Heart Failure:  Yes    Current as of 7 minutes ago  1 Has Vascular Disease:  Yes    Current as of 7 minutes ago  1 Has Hypertension:  Yes    Current as of 7 minutes ago  2 Age:  23    Current as of 7 minutes ago  0 Has Diabetes:  No    Current as of 7 minutes ago  0 Had Stroke:  No  Had TIA:  No  Had thromboembolism:  No    Current as of 7 minutes ago  0 Male:  No    Current as of 7 minutes ago            Final Clinical Impressions(s) / ED Diagnoses   Final diagnoses:  Paroxysmal atrial fibrillation with rapid ventricular response (HCC)  Normochromic normocytic anemia  Elevated glucose level    ED Discharge Orders         Ordered    apixaban (ELIQUIS) 5 MG TABS tablet  2 times daily     07/25/18 0141    Amb referral to AFIB Clinic     07/24/18 9983           Delora Fuel, MD 38/25/05 [id number]    Delora Fuel, MD 73/41/93 [phone]

## 2018-07-25 ENCOUNTER — Telehealth: Payer: Self-pay | Admitting: *Deleted

## 2018-07-25 ENCOUNTER — Telehealth: Payer: Self-pay | Admitting: Cardiology

## 2018-07-25 DIAGNOSIS — I4891 Unspecified atrial fibrillation: Secondary | ICD-10-CM | POA: Diagnosis not present

## 2018-07-25 DIAGNOSIS — Z6830 Body mass index (BMI) 30.0-30.9, adult: Secondary | ICD-10-CM | POA: Diagnosis not present

## 2018-07-25 LAB — BASIC METABOLIC PANEL
Anion gap: 10 (ref 5–15)
BUN: 31 mg/dL — ABNORMAL HIGH (ref 8–23)
CO2: 22 mmol/L (ref 22–32)
Calcium: 9.1 mg/dL (ref 8.9–10.3)
Chloride: 106 mmol/L (ref 98–111)
Creatinine, Ser: 1.05 mg/dL (ref 0.61–1.24)
GFR calc Af Amer: >60 mL/min (ref >60)
GFR calc non Af Amer: >60 mL/min (ref >60)
Glucose, Bld: 173 mg/dL — ABNORMAL HIGH (ref 70–99)
Potassium: 3.7 mmol/L (ref 3.5–5.1)
Sodium: 138 mmol/L (ref 135–145)

## 2018-07-25 LAB — MAGNESIUM: Magnesium: 2.2 mg/dL (ref 1.7–2.4)

## 2018-07-25 MED ORDER — PROPOFOL 10 MG/ML IV BOLUS
INTRAVENOUS | Status: AC | PRN
  Administered 2018-07-25: 48.75 mg via INTRAVENOUS

## 2018-07-25 MED ORDER — APIXABAN 5 MG PO TABS: 5.0000 mg | ORAL_TABLET | Freq: Two times a day (BID) | ORAL | 0 refills | Status: DC

## 2018-07-25 NOTE — Telephone Encounter (Signed)
Since he was converted to sinus rhythm in the ER, probably does not need an ECG now unless heart rate goes back up.  He should have a 2-week telehealth visit.

## 2018-07-25 NOTE — Telephone Encounter (Signed)
I reviewed the chart.  He was seen in the ER last night in rapid atrial fibrillation and was actually electrically cardioverted and started on Eliquis by ER staff.  Because he was cardioverted, we should continue anticoagulation for now, we had even discussed using this longer term previously in light of his elevated stroke risk with PAF, but he had declined.  DES intervention was in October 2019.  I do see in the cardiac catheterization report mention made to shortening DAPT if needed.  I would recommend stopping Plavix and continuing on aspirin with his Eliquis for now.  Will inform the research team as well.  If they have other specific recommendations, these should be made clear.

## 2018-07-25 NOTE — Discharge Instructions (Addendum)
Your blood sugar was a little high tonight - 173. Your primary care provider will have to monitor it. If it stays high, you may need to be started on medicine for diabetes.

## 2018-07-25 NOTE — Telephone Encounter (Signed)
Patient was seen in the [place] 07/24/2018 . Patient was advised to start Eliquis but he is already on a blood thinner.

## 2018-07-25 NOTE — Telephone Encounter (Signed)
Wife concerned about patient taking eliquis, aspirin and plavix. Wife says that patient is in a study through the [place] office to take aspirin and plavix for one year. Please advise.

## 2018-07-25 NOTE — Telephone Encounter (Signed)
Open by error

## 2018-07-25 NOTE — Telephone Encounter (Signed)
Patient informed and verbalized understanding of plan. 

## 2018-07-25 NOTE — Telephone Encounter (Signed)
Patient informed and verbalized understanding.  Patient verbally consented for telehealth visits with [facility] and understands that his insurance company will be billed for the encounter.

## 2018-07-28 ENCOUNTER — Emergency Department (HOSPITAL_COMMUNITY): Payer: Medicare HMO

## 2018-07-28 ENCOUNTER — Inpatient Hospital Stay (HOSPITAL_COMMUNITY)
Admission: EM | Admit: 2018-07-28 | Discharge: 2018-07-31 | DRG: 310 | Disposition: A | Discharge status: Home / Self Care | Payer: Medicare HMO | Attending: Internal Medicine | Admitting: Internal Medicine

## 2018-07-28 ENCOUNTER — Other Ambulatory Visit: Payer: Self-pay

## 2018-07-28 ENCOUNTER — Encounter (HOSPITAL_COMMUNITY): Payer: Self-pay | Admitting: *Deleted

## 2018-07-28 DIAGNOSIS — I480 Paroxysmal atrial fibrillation: Secondary | ICD-10-CM | POA: Diagnosis present

## 2018-07-28 DIAGNOSIS — I2510 Atherosclerotic heart disease of native coronary artery without angina pectoris: Secondary | ICD-10-CM | POA: Diagnosis present

## 2018-07-28 DIAGNOSIS — I471 Supraventricular tachycardia: Secondary | ICD-10-CM | POA: Diagnosis present

## 2018-07-28 DIAGNOSIS — Z87442 Personal history of urinary calculi: Secondary | ICD-10-CM

## 2018-07-28 DIAGNOSIS — Z8042 Family history of malignant neoplasm of prostate: Secondary | ICD-10-CM

## 2018-07-28 DIAGNOSIS — I482 Chronic atrial fibrillation, unspecified: Principal | ICD-10-CM | POA: Diagnosis present

## 2018-07-28 DIAGNOSIS — Z8546 Personal history of malignant neoplasm of prostate: Secondary | ICD-10-CM

## 2018-07-28 DIAGNOSIS — I4891 Unspecified atrial fibrillation: Secondary | ICD-10-CM | POA: Diagnosis not present

## 2018-07-28 DIAGNOSIS — Z801 Family history of malignant neoplasm of trachea, bronchus and lung: Secondary | ICD-10-CM

## 2018-07-28 DIAGNOSIS — D509 Iron deficiency anemia, unspecified: Secondary | ICD-10-CM | POA: Diagnosis present

## 2018-07-28 DIAGNOSIS — Z7982 Long term (current) use of aspirin: Secondary | ICD-10-CM

## 2018-07-28 DIAGNOSIS — Z955 Presence of coronary angioplasty implant and graft: Secondary | ICD-10-CM

## 2018-07-28 DIAGNOSIS — Z888 Allergy status to other drugs, medicaments and biological substances status: Secondary | ICD-10-CM

## 2018-07-28 DIAGNOSIS — I252 Old myocardial infarction: Secondary | ICD-10-CM

## 2018-07-28 DIAGNOSIS — Z79899 Other long term (current) drug therapy: Secondary | ICD-10-CM

## 2018-07-28 DIAGNOSIS — I10 Essential (primary) hypertension: Secondary | ICD-10-CM | POA: Diagnosis present

## 2018-07-28 DIAGNOSIS — M109 Gout, unspecified: Secondary | ICD-10-CM | POA: Diagnosis present

## 2018-07-28 DIAGNOSIS — Z8249 Family history of ischemic heart disease and other diseases of the circulatory system: Secondary | ICD-10-CM

## 2018-07-28 DIAGNOSIS — R000 Tachycardia, unspecified: Secondary | ICD-10-CM | POA: Diagnosis not present

## 2018-07-28 DIAGNOSIS — Z87891 Personal history of nicotine dependence: Secondary | ICD-10-CM

## 2018-07-28 DIAGNOSIS — E785 Hyperlipidemia, unspecified: Secondary | ICD-10-CM | POA: Diagnosis present

## 2018-07-28 DIAGNOSIS — E782 Mixed hyperlipidemia: Secondary | ICD-10-CM | POA: Diagnosis present

## 2018-07-28 DIAGNOSIS — Z7902 Long term (current) use of antithrombotics/antiplatelets: Secondary | ICD-10-CM

## 2018-07-28 LAB — CBC
HCT: 35.6 % — ABNORMAL LOW (ref 39.0–52.0)
Hemoglobin: 11.4 g/dL — ABNORMAL LOW (ref 13.0–17.0)
MCH: 29.8 pg (ref 26.0–34.0)
MCHC: 32 g/dL (ref 30.0–36.0)
MCV: 93.2 fL (ref 80.0–100.0)
Platelets: 199 10*3/uL (ref 150–400)
RBC: 3.82 MIL/uL — ABNORMAL LOW (ref 4.22–5.81)
RDW: 16.2 % — ABNORMAL HIGH (ref 11.5–15.5)
WBC: 7.8 10*3/uL (ref 4.0–10.5)
nRBC: 0 % (ref 0.0–0.2)

## 2018-07-28 LAB — DIFFERENTIAL
Basophils Absolute: 0.1 10*3/uL (ref 0.0–0.1)
Basophils Relative: 1 %
Eosinophils Absolute: 0.1 10*3/uL (ref 0.0–0.5)
Eosinophils Relative: 2 %
Lymphocytes Relative: 29 %
Lymphs Abs: 2.3 10*3/uL (ref 0.7–4.0)
Monocytes Absolute: 0.8 10*3/uL (ref 0.1–1.0)
Monocytes Relative: 10 %
Neutro Abs: 4.5 10*3/uL (ref 1.7–7.7)
Neutrophils Relative %: 58 %

## 2018-07-28 MED ORDER — SODIUM CHLORIDE 0.9% FLUSH
3.0000 mL | Freq: Once | INTRAVENOUS | Status: AC
  Administered 2018-07-28: 3 mL via INTRAVENOUS

## 2018-07-28 MED ORDER — DILTIAZEM HCL 100 MG IV SOLR
5.0000 mg/h | INTRAVENOUS | Status: DC
  Administered 2018-07-28: 5 mg/h via INTRAVENOUS
  Filled 2018-07-28: qty 100

## 2018-07-28 MED ORDER — DILTIAZEM HCL 25 MG/5ML IV SOLN
10.0000 mg | Freq: Once | INTRAVENOUS | Status: AC
  Administered 2018-07-28: 10 mg via INTRAVENOUS
  Filled 2018-07-28: qty 5

## 2018-07-28 NOTE — ED Triage Notes (Signed)
Pt c/o sudden onset of fast heartbeat that started tonight.

## 2018-07-28 NOTE — ED Notes (Signed)
X-Ray at bedside.

## 2018-07-29 DIAGNOSIS — I252 Old myocardial infarction: Secondary | ICD-10-CM | POA: Diagnosis not present

## 2018-07-29 DIAGNOSIS — I480 Paroxysmal atrial fibrillation: Secondary | ICD-10-CM | POA: Diagnosis not present

## 2018-07-29 DIAGNOSIS — Z8249 Family history of ischemic heart disease and other diseases of the circulatory system: Secondary | ICD-10-CM | POA: Diagnosis not present

## 2018-07-29 DIAGNOSIS — Z8042 Family history of malignant neoplasm of prostate: Secondary | ICD-10-CM | POA: Diagnosis not present

## 2018-07-29 DIAGNOSIS — I10 Essential (primary) hypertension: Secondary | ICD-10-CM | POA: Diagnosis not present

## 2018-07-29 DIAGNOSIS — I2510 Atherosclerotic heart disease of native coronary artery without angina pectoris: Secondary | ICD-10-CM | POA: Diagnosis not present

## 2018-07-29 DIAGNOSIS — Z7902 Long term (current) use of antithrombotics/antiplatelets: Secondary | ICD-10-CM | POA: Diagnosis not present

## 2018-07-29 DIAGNOSIS — Z888 Allergy status to other drugs, medicaments and biological substances status: Secondary | ICD-10-CM | POA: Diagnosis not present

## 2018-07-29 DIAGNOSIS — Z955 Presence of coronary angioplasty implant and graft: Secondary | ICD-10-CM | POA: Diagnosis not present

## 2018-07-29 DIAGNOSIS — Z8546 Personal history of malignant neoplasm of prostate: Secondary | ICD-10-CM | POA: Diagnosis not present

## 2018-07-29 DIAGNOSIS — Z7982 Long term (current) use of aspirin: Secondary | ICD-10-CM | POA: Diagnosis not present

## 2018-07-29 DIAGNOSIS — Z87891 Personal history of nicotine dependence: Secondary | ICD-10-CM | POA: Diagnosis not present

## 2018-07-29 DIAGNOSIS — I4891 Unspecified atrial fibrillation: Secondary | ICD-10-CM

## 2018-07-29 DIAGNOSIS — Z87442 Personal history of urinary calculi: Secondary | ICD-10-CM | POA: Diagnosis not present

## 2018-07-29 DIAGNOSIS — Z801 Family history of malignant neoplasm of trachea, bronchus and lung: Secondary | ICD-10-CM | POA: Diagnosis not present

## 2018-07-29 DIAGNOSIS — Z79899 Other long term (current) drug therapy: Secondary | ICD-10-CM | POA: Diagnosis not present

## 2018-07-29 DIAGNOSIS — M109 Gout, unspecified: Secondary | ICD-10-CM | POA: Diagnosis present

## 2018-07-29 DIAGNOSIS — E785 Hyperlipidemia, unspecified: Secondary | ICD-10-CM | POA: Diagnosis present

## 2018-07-29 DIAGNOSIS — D509 Iron deficiency anemia, unspecified: Secondary | ICD-10-CM | POA: Diagnosis not present

## 2018-07-29 DIAGNOSIS — I471 Supraventricular tachycardia: Secondary | ICD-10-CM | POA: Diagnosis present

## 2018-07-29 DIAGNOSIS — E782 Mixed hyperlipidemia: Secondary | ICD-10-CM | POA: Diagnosis not present

## 2018-07-29 DIAGNOSIS — I482 Chronic atrial fibrillation, unspecified: Secondary | ICD-10-CM | POA: Diagnosis not present

## 2018-07-29 LAB — COMPREHENSIVE METABOLIC PANEL
ALT: 22 U/L (ref 0–44)
AST: 22 U/L (ref 15–41)
Albumin: 3.4 g/dL — ABNORMAL LOW (ref 3.5–5.0)
Alkaline Phosphatase: 92 U/L (ref 38–126)
Anion gap: 7 (ref 5–15)
BUN: 25 mg/dL — ABNORMAL HIGH (ref 8–23)
CO2: 24 mmol/L (ref 22–32)
Calcium: 9.1 mg/dL (ref 8.9–10.3)
Chloride: 108 mmol/L (ref 98–111)
Creatinine, Ser: 0.83 mg/dL (ref 0.61–1.24)
GFR calc Af Amer: >60 mL/min (ref >60)
GFR calc non Af Amer: >60 mL/min (ref >60)
Glucose, Bld: 155 mg/dL — ABNORMAL HIGH (ref 70–99)
Potassium: 4 mmol/L (ref 3.5–5.1)
Sodium: 139 mmol/L (ref 135–145)
Total Bilirubin: 0.4 mg/dL (ref 0.3–1.2)
Total Protein: 6.4 g/dL — ABNORMAL LOW (ref 6.5–8.1)

## 2018-07-29 LAB — BASIC METABOLIC PANEL
Anion gap: 9 (ref 5–15)
BUN: 25 mg/dL — ABNORMAL HIGH (ref 8–23)
CO2: 26 mmol/L (ref 22–32)
Calcium: 9.6 mg/dL (ref 8.9–10.3)
Chloride: 105 mmol/L (ref 98–111)
Creatinine, Ser: 1.01 mg/dL (ref 0.61–1.24)
GFR calc Af Amer: >60 mL/min (ref >60)
GFR calc non Af Amer: >60 mL/min (ref >60)
Glucose, Bld: 191 mg/dL — ABNORMAL HIGH (ref 70–99)
Potassium: 4 mmol/L (ref 3.5–5.1)
Sodium: 140 mmol/L (ref 135–145)

## 2018-07-29 LAB — CBC
HCT: 32.7 % — ABNORMAL LOW (ref 39.0–52.0)
Hemoglobin: 10.5 g/dL — ABNORMAL LOW (ref 13.0–17.0)
MCH: 30.1 pg (ref 26.0–34.0)
MCHC: 32.1 g/dL (ref 30.0–36.0)
MCV: 93.7 fL (ref 80.0–100.0)
Platelets: 192 10*3/uL (ref 150–400)
RBC: 3.49 MIL/uL — ABNORMAL LOW (ref 4.22–5.81)
RDW: 16.2 % — ABNORMAL HIGH (ref 11.5–15.5)
WBC: 7.9 10*3/uL (ref 4.0–10.5)
nRBC: 0 % (ref 0.0–0.2)

## 2018-07-29 LAB — MRSA PCR SCREENING: MRSA by PCR: NEGATIVE

## 2018-07-29 LAB — TROPONIN I: Troponin I: <0.03 ng/mL (ref <0.03)

## 2018-07-29 MED ORDER — ACETAMINOPHEN 325 MG PO TABS: 650.0000 mg | ORAL_TABLET | Freq: Four times a day (QID) | ORAL | Status: DC | PRN

## 2018-07-29 MED ORDER — ONDANSETRON HCL 4 MG PO TABS: 4.0000 mg | ORAL_TABLET | Freq: Four times a day (QID) | ORAL | Status: DC | PRN

## 2018-07-29 MED ORDER — SODIUM CHLORIDE 0.9 % IV SOLN
INTRAVENOUS | Status: DC
  Administered 2018-07-29: 03:00:00 via INTRAVENOUS

## 2018-07-29 MED ORDER — ALLOPURINOL 300 MG PO TABS
450.0000 mg | ORAL_TABLET | Freq: Every day | ORAL | Status: DC
  Administered 2018-07-29 – 2018-07-31 (×3): 450 mg via ORAL
  Filled 2018-07-29 (×3): qty 2

## 2018-07-29 MED ORDER — METOPROLOL SUCCINATE ER 50 MG PO TB24
75.0000 mg | ORAL_TABLET | Freq: Every day | ORAL | Status: DC
  Administered 2018-07-30 – 2018-07-31 (×2): 75 mg via ORAL
  Filled 2018-07-29 (×2): qty 1

## 2018-07-29 MED ORDER — NITROGLYCERIN 0.4 MG SL SUBL: 0.4000 mg | SUBLINGUAL_TABLET | SUBLINGUAL | Status: DC | PRN

## 2018-07-29 MED ORDER — ATORVASTATIN CALCIUM 40 MG PO TABS
80.0000 mg | ORAL_TABLET | Freq: Every day | ORAL | Status: DC
  Administered 2018-07-29 – 2018-07-30 (×2): 80 mg via ORAL
  Filled 2018-07-29 (×2): qty 2

## 2018-07-29 MED ORDER — METOPROLOL SUCCINATE ER 50 MG PO TB24
50.0000 mg | ORAL_TABLET | Freq: Every day | ORAL | Status: DC
  Administered 2018-07-29: 50 mg via ORAL
  Filled 2018-07-29: qty 1

## 2018-07-29 MED ORDER — ONDANSETRON HCL 4 MG/2ML IJ SOLN: 4.0000 mg | Freq: Four times a day (QID) | INTRAMUSCULAR | Status: DC | PRN

## 2018-07-29 MED ORDER — VITAMIN C 500 MG PO TABS
500.0000 mg | ORAL_TABLET | Freq: Two times a day (BID) | ORAL | Status: DC
  Administered 2018-07-29 – 2018-07-31 (×5): 500 mg via ORAL
  Filled 2018-07-29 (×5): qty 1

## 2018-07-29 MED ORDER — DILTIAZEM HCL 60 MG PO TABS
60.0000 mg | ORAL_TABLET | Freq: Three times a day (TID) | ORAL | Status: DC
  Administered 2018-07-29 – 2018-07-31 (×5): 60 mg via ORAL
  Filled 2018-07-29 (×5): qty 1

## 2018-07-29 MED ORDER — APIXABAN 5 MG PO TABS
5.0000 mg | ORAL_TABLET | Freq: Two times a day (BID) | ORAL | Status: DC
  Administered 2018-07-29 – 2018-07-31 (×6): 5 mg via ORAL
  Filled 2018-07-29 (×6): qty 1

## 2018-07-29 MED ORDER — ADULT MULTIVITAMIN W/MINERALS CH
1.0000 | ORAL_TABLET | Freq: Every day | ORAL | Status: DC
  Administered 2018-07-29 – 2018-07-31 (×3): 1 via ORAL
  Filled 2018-07-29 (×3): qty 1

## 2018-07-29 MED ORDER — FERROUS SULFATE 325 (65 FE) MG PO TABS
325.0000 mg | ORAL_TABLET | Freq: Two times a day (BID) | ORAL | Status: DC
  Administered 2018-07-29 – 2018-07-31 (×5): 325 mg via ORAL
  Filled 2018-07-29 (×5): qty 1

## 2018-07-29 MED ORDER — ACETAMINOPHEN 650 MG RE SUPP: 650.0000 mg | Freq: Four times a day (QID) | RECTAL | Status: DC | PRN

## 2018-07-29 MED ORDER — ASPIRIN EC 81 MG PO TBEC
81.0000 mg | DELAYED_RELEASE_TABLET | Freq: Every day | ORAL | Status: DC
  Administered 2018-07-29 – 2018-07-31 (×3): 81 mg via ORAL
  Filled 2018-07-29 (×3): qty 1

## 2018-07-29 MED ORDER — PANTOPRAZOLE SODIUM 40 MG PO TBEC
40.0000 mg | DELAYED_RELEASE_TABLET | Freq: Two times a day (BID) | ORAL | Status: DC
  Administered 2018-07-29 – 2018-07-31 (×5): 40 mg via ORAL
  Filled 2018-07-29 (×5): qty 1

## 2018-07-29 NOTE — TOC Benefit Eligibility Note (Signed)
Transition of Care [facility]) Benefit Eligibility Note    Patient Details  Name: [name] [name] MRN: [id number] Date of Birth: 1941/02/18   Medication/Dose: Eliquis 20m twice a day  Covered?: Yes  Tier: 3 Drug  Prescription Coverage [facility] [address] [place] Person/Company/Phone Number:: SMinda Dittowith [employer] at [phone] 2  Co-Pay: $47/30 day supply  Prior Approval: No  Deductible: Met       JTommy MedalPhone Number: 07/29/2018, 12:58 PM

## 2018-07-29 NOTE — H&P (Signed)
TRH H&P    Patient Demographics:    [name] [name], is a 78 y.o. male  MRN: [id number]  DOB - 01/16/1941  Admit Date - 07/28/2018  Referring MD/NP/PA: Milton Ferguson  Outpatient Primary MD for the patient is Caryl Bis, MD  Patient coming from: Home  Chief complaint-palpitations   HPI:    [name] [name]  is a 78 y.o. male, with history of hypertension, coronary artery disease,Status post PCI with DES to circumflex, paroxysmal atrial fibrillation who was recently seen in ED on 07/24/2018. At that time patient was found to be in atrial fibrillation, underwent  electrical cardioversion, he was seen by cardiology in a.m. and found to be in normal sinus rhythm.  Patient was discharged home on aspirin and Eliquis.  Patient's Plavix was discontinued.  Patient said that he could not get Eliquis filled by pharmacy as it was expensive.  Today came to hospital with palpitations, and he says that he knew that he was in A. fib again. He denies chest pain or shortness of breath. Denies nausea vomiting or diarrhea. Denies abdominal pain, dysuria.  In the ED, patient was started on Cardizem gtt     Review of systems:    In addition to the HPI above,  .  All other systems reviewed and are negative.    Past History of the following :    Past Medical History:  Diagnosis Date  . Anemia   . Arthritis   . CAD (coronary artery disease)    DES to circumflex 12/2017  . Gout   . Headache   . History of kidney stones   . Hypertension   . NSTEMI (non-ST elevated myocardial infarction) ([place]) 12/31/2017  . Paroxysmal atrial fibrillation (HCC)   . Pericardial effusion    Idiopathic, recurrent pericardial effusion s/p pericardial window.  . Prostate cancer ([place]) 2007   S/P Seed implants   . Supraventricular tachycardia ([place])   . Temporal arteritis [facility])       Past Surgical History:  Procedure Laterality Date  .  CATARACT EXTRACTION W/PHACO Left 06/07/2015   Procedure: CATARACT EXTRACTION PHACO AND INTRAOCULAR LENS PLACEMENT LEFT EYE CDE=8.00;  Surgeon: Tonny Branch, MD;  Location: [facility];  Service: Ophthalmology;  Laterality: Left;  . CATARACT EXTRACTION W/PHACO Right 06/17/2015   Procedure: CATARACT EXTRACTION PHACO AND INTRAOCULAR LENS PLACEMENT RIGHT EYE CDE=11.09;  Surgeon: Tonny Branch, MD;  Location: [facility];  Service: Ophthalmology;  Laterality: Right;  . CORONARY ANGIOPLASTY WITH STENT PLACEMENT  01/01/2018  . CORONARY STENT INTERVENTION N/A 01/01/2018   Procedure: CORONARY STENT INTERVENTION;  Surgeon: Jettie Booze, MD;  Location: [place] [facility];  Service: Cardiovascular;  Laterality: N/A;  . FRACTURE SURGERY    . INSERTION PROSTATE RADIATION SEED  2007  . KNEE ARTHROSCOPY Left   . LEFT HEART CATH AND CORONARY ANGIOGRAPHY N/A 01/01/2018   Procedure: LEFT HEART CATH AND CORONARY ANGIOGRAPHY;  Surgeon: Jettie Booze, MD;  Location: [place] [facility];  Service: Cardiovascular;  Laterality: N/A;  . ORIF TIBIA & FIBULA FRACTURES  Left 2003   Distal tibial/fibula   . PERICARDIAL WINDOW  December 2008  . PERICARDIOCENTESIS  2007   hx/notes 10/19/2011      Social History:      Social History   Tobacco Use  . Smoking status: Former Smoker    Packs/day: 0.30    Years: 10.00    Pack years: 3.00    Types: Cigarettes    Start date: 08/06/1958    Last attempt to quit: 1968    Years since quitting: 52.3  . Smokeless tobacco: Never Used  Substance Use Topics  . Alcohol use: Not Currently       Family History :     Family History  Problem Relation Age of Onset  . CAD Mother        MI in 18s  . Prostate cancer Father   . Prostate cancer Brother   . Lung cancer Brother       Home Medications:   Prior to Admission medications   Medication Sig Start Date End Date Taking? Authorizing Provider  allopurinol (ZYLOPRIM) 300 MG tablet Take 450 mg by mouth daily.     [provider]  apixaban (ELIQUIS) 5 MG TABS tablet Take 1 tablet (5 mg total) by mouth 2 (two) times daily. 1/61/09   Delora Fuel, MD  aspirin 81 MG tablet Take 81 mg by mouth daily.    [provider]  atorvastatin (LIPITOR) 80 MG tablet TAKE 1 TABLET BY MOUTH ONCE DAILY AT  [facility] 07/01/18   Satira Sark, MD  benazepril (LOTENSIN) 40 MG tablet Take 1 tablet (40 mg total) by mouth daily. 07/01/18   Satira Sark, MD  ferrous sulfate 325 (65 FE) MG tablet Take 325 mg by mouth 2 (two) times daily.    [provider]  hydrALAZINE (APRESOLINE) 100 MG tablet Take 100 mg by mouth 2 (two) times daily.    [provider]  hydrochlorothiazide (HYDRODIURIL) 12.5 MG tablet Take 12.5 mg by mouth daily. for high blood pressure 12/28/17   [provider]  metoprolol succinate (TOPROL-XL) 50 MG 24 hr tablet TAKE 1 TABLET BY MOUTH ONCE DAILY WITH FOOD OR  IMMEDIATELY  FOLLOWING  THE  SAME  MEAL 01/24/18   Satira Sark, MD  Multiple Vitamin (MULTIVITAMIN) tablet Take 1 tablet by mouth daily.    [provider]  nitroGLYCERIN (NITROSTAT) 0.4 MG SL tablet Place 1 tablet (0.4 mg total) under the tongue every 5 (five) minutes x 3 doses as needed for chest pain. 01/02/18   Cheryln Manly, NP  pantoprazole (PROTONIX) 40 MG tablet TAKE 1 TABLET BY MOUTH ONCE DAILY Patient taking differently: Take 40 mg by mouth 2 (two) times daily.  12/12/17   Satira Sark, MD  spironolactone (ALDACTONE) 25 MG tablet Take 25 mg by mouth 2 (two) times daily.    [provider]  vitamin C (ASCORBIC ACID) 500 MG tablet Take 500 mg by mouth 2 (two) times daily.     [provider]     Allergies:     Allergies  Allergen Reactions  . Amiodarone     rash     Physical Exam:   Vitals  Blood pressure 121/74, pulse (!) 113, temperature 98.1 F (36.7 C), temperature source Oral, resp. rate (!) 23, height 6' (1.829 m), weight 97.5 kg, SpO2 98 %.  1.   General: Appears in no acute distress  2. Psychiatric: Alert, oriented x3, intact insight and judgment  3. Neurologic: Cranial nerves II - 12 grossly intact, no focal deficit noted.  4. HEENMT:  Atraumatic normocephalic extraocular muscles intact, oral mucosa is pink and moist  5. Respiratory : Clear to auscultation bilaterally, no wheezing or crackles  6. Cardiovascular : S1-S2, regular, no murmur auscultated  7. Gastrointestinal:  Abdomen is soft, nontender, no organomegaly      Data Review:    CBC Recent Labs  Lab 07/24/18 2347 07/28/18 2328  WBC 6.1 7.8  HGB 10.8* 11.4*  HCT 33.1* 35.6*  PLT 180 199  MCV 92.2 93.2  MCH 30.1 29.8  MCHC 32.6 32.0  RDW 16.2* 16.2*  LYMPHSABS 1.7 2.3  MONOABS 0.7 0.8  EOSABS 0.1 0.1  BASOSABS 0.1 0.1   ------------------------------------------------------------------------------------------------------------------  Results for orders placed or performed during the hospital encounter of 07/28/18 (from the past 48 hour(s))  Basic metabolic panel     Status: Abnormal   Collection Time: 07/28/18 11:28 PM  Result Value Ref Range   Sodium 140 135 - 145 mmol/L   Potassium 4.0 3.5 - 5.1 mmol/L   Chloride 105 98 - 111 mmol/L   CO2 26 22 - 32 mmol/L   Glucose, Bld 191 (H) 70 - 99 mg/dL   BUN 25 (H) 8 - 23 mg/dL   Creatinine, Ser 1.01 0.61 - 1.24 mg/dL   Calcium 9.6 8.9 - 10.3 mg/dL   GFR calc non Af Amer >60 >60 mL/min   GFR calc Af Amer >60 >60 mL/min   Anion gap 9 5 - 15    Comment: Performed at [facility], [address] [place], [place] [address]  CBC     Status: Abnormal   Collection Time: 07/28/18 11:28 PM  Result Value Ref Range   WBC 7.8 4.0 - 10.5 K/uL   RBC 3.82 (L) 4.22 - 5.81 MIL/uL   Hemoglobin 11.4 (L) 13.0 - 17.0 g/dL   HCT 35.6 (L) 39.0 - 52.0 %   MCV 93.2 80.0 - 100.0 fL   MCH 29.8 26.0 - 34.0 pg   MCHC 32.0 30.0 - 36.0 g/dL   RDW 16.2 (H) 11.5 - 15.5 %   Platelets 199 150 - 400 K/uL   nRBC 0.0 0.0 -  0.2 %    Comment: Performed at [facility], [address], [place], [place] [address]  Troponin I - ONCE - STAT     Status: None   Collection Time: 07/28/18 11:28 PM  Result Value Ref Range   Troponin I <0.03 <0.03 ng/mL    Comment: Performed at [facility], [address] [place], [place] [address]  Differential     Status: None   Collection Time: 07/28/18 11:28 PM  Result Value Ref Range   Neutrophils Relative % 58 %   Neutro Abs 4.5 1.7 - 7.7 K/uL   Lymphocytes Relative 29 %   Lymphs Abs 2.3 0.7 - 4.0 K/uL   Monocytes Relative 10 %   Monocytes Absolute 0.8 0.1 - 1.0 K/uL   Eosinophils Relative 2 %   Eosinophils Absolute 0.1 0.0 - 0.5 K/uL   Basophils Relative 1 %   Basophils Absolute 0.1 0.0 - 0.1 K/uL    Comment: Performed at [facility], [address], [place], [place] [address]    Chemistries  Recent Labs  Lab 07/24/18 2347 07/28/18 2328  NA 138 140  K 3.7 4.0  CL 106 105  CO2 22 26  GLUCOSE 173* 191*  BUN 31* 25*  CREATININE 1.05 1.01  CALCIUM  9.1 9.6  MG 2.2  --    ------------------------------------------------------------------------------------------------------------------  ------------------------------------------------------------------------------------------------------------------ GFR: Estimated Creatinine Clearance: 74.2 mL/min (by C-G formula based on SCr of 1.01 mg/dL). Liver Function Tests: No results for input(s): AST, ALT, ALKPHOS, BILITOT, PROT, ALBUMIN in the last 168 hours. No results for input(s): LIPASE, AMYLASE in the last 168 hours. No results for input(s): AMMONIA in the last 168 hours. Coagulation Profile: No results for input(s): INR, PROTIME in the last 168 hours. Cardiac Enzymes: Recent Labs  Lab 07/28/18 2328  TROPONINI <0.03   BNP (last 3 results) No results for input(s): PROBNP in the last 8760 hours. HbA1C: No results for input(s): HGBA1C in the last 72 hours. CBG: No results for input(s): GLUCAP in the last 168  hours. Lipid Profile: No results for input(s): CHOL, HDL, LDLCALC, TRIG, CHOLHDL, LDLDIRECT in the last 72 hours. Thyroid Function Tests: No results for input(s): TSH, T4TOTAL, FREET4, T3FREE, THYROIDAB in the last 72 hours. Anemia Panel: No results for input(s): VITAMINB12, FOLATE, FERRITIN, TIBC, IRON, RETICCTPCT in the last 72 hours.  --------------------------------------------------------------------------------------------------------------- Urine analysis:    Component Value Date/Time   COLORURINE YELLOW 01/05/2014 1427   APPEARANCEUR CLEAR 01/05/2014 1427   LABSPEC 1.010 01/05/2014 1427   PHURINE 6.5 01/05/2014 1427   GLUCOSEU NEGATIVE 01/05/2014 1427   HGBUR NEGATIVE 01/05/2014 1427   BILIRUBINUR NEGATIVE 01/05/2014 1427   KETONESUR NEGATIVE 01/05/2014 1427   PROTEINUR 30 (A) 01/05/2014 1427   UROBILINOGEN 1.0 01/05/2014 1427   NITRITE NEGATIVE 01/05/2014 1427   LEUKOCYTESUR NEGATIVE 01/05/2014 1427      Imaging Results:    Dg Chest Portable 1 View  Result Date: 07/28/2018 CLINICAL DATA:  Tachycardia EXAM: PORTABLE CHEST 1 VIEW COMPARISON:  January 05, 2014 FINDINGS: The heart size and mediastinal contours are within normal limits. Both lungs are clear. The visualized skeletal structures are unremarkable. IMPRESSION: No active disease. Electronically Signed   By: Dorise Bullion III M.D   On: 07/28/2018 23:56    My personal review of EKG: Rhythm atrial fibrillation with RVR   Assessment & Plan:    Active Problems:   Atrial fibrillation with RVR (HCC)   1. Atrial fibrillation with RVR-patient has history of proximal atrial fibrillation, CHA2DS2VASc score is 5.  Patient was started on Eliquis, will continue with Eliquis.  He has been started on Cardizem gtt. in the ED.  Continue with IV Cardizem.  Will consult cardiology in a.m. for medication readjustment, as patient could not afford Eliquis.  2. Hypertension-blood pressure stable, will hold home antihypertensive  medications except Toprol-XL 50 mg daily.  3. CAD status post DES to circumflex-continue aspirin, Lipitor, Toprol-XL   DVT Prophylaxis-   Eliquis  AM Labs Ordered, also please review Full Orders  Family Communication: Admission, patients condition and plan of care including tests being ordered have been discussed with the patient  who indicate understanding and agree with the plan and Code Status.  Code Status: Full code  Admission status: Inpatient: Based on patients clinical presentation and evaluation of above clinical data, I have made determination that patient meets Inpatient criteria at this time.  Time spent in minutes : 60 minutes   Oswald Hillock M.D on 07/29/2018 at 1:44 AM

## 2018-07-29 NOTE — Care Management Important Message (Signed)
Important Message  Patient Details  Name: [name] [name] MRN: [id number] Date of Birth: 1940-09-17   Medicare Important Message Given:  Yes    Tommy Medal 07/29/2018, 2:38 PM

## 2018-07-29 NOTE — Progress Notes (Addendum)
CROSS COVERAGE NOTE   07/29/2018 9:25 AM  [name] [name] was seen and examined in stepdown ICU.  His heart rate is well controlled on IV diltiazem infusion.  Pt scheduled to start oral toprol XL later this morning.  There was a question about patient being able to afford apixaban.  He says that it would be expensive for him to take it but he is willing to sacrifice so that he will be able to take it at this time.  I will consult TOC team to help him with medications. He would like to see his cardiologists.  I told him that they would see him later today.  He verbalized understanding.  The H&P by the admitting provider, orders, imaging was reviewed.  Please see new orders.  Will continue to follow.   Vitals:   07/29/18 0615 07/29/18 0731  BP:    Pulse: 72 87  Resp: 14 14  Temp:  98.5 F (36.9 C)  SpO2: 97% 96%    Results for orders placed or performed during the hospital encounter of 07/28/18  MRSA PCR Screening  Result Value Ref Range   MRSA by PCR NEGATIVE NEGATIVE  Basic metabolic panel  Result Value Ref Range   Sodium 140 135 - 145 mmol/L   Potassium 4.0 3.5 - 5.1 mmol/L   Chloride 105 98 - 111 mmol/L   CO2 26 22 - 32 mmol/L   Glucose, Bld 191 (H) 70 - 99 mg/dL   BUN 25 (H) 8 - 23 mg/dL   Creatinine, Ser 1.01 0.61 - 1.24 mg/dL   Calcium 9.6 8.9 - 10.3 mg/dL   GFR calc non Af Amer >60 >60 mL/min   GFR calc Af Amer >60 >60 mL/min   Anion gap 9 5 - 15  CBC  Result Value Ref Range   WBC 7.8 4.0 - 10.5 K/uL   RBC 3.82 (L) 4.22 - 5.81 MIL/uL   Hemoglobin 11.4 (L) 13.0 - 17.0 g/dL   HCT 35.6 (L) 39.0 - 52.0 %   MCV 93.2 80.0 - 100.0 fL   MCH 29.8 26.0 - 34.0 pg   MCHC 32.0 30.0 - 36.0 g/dL   RDW 16.2 (H) 11.5 - 15.5 %   Platelets 199 150 - 400 K/uL   nRBC 0.0 0.0 - 0.2 %  Troponin I - ONCE - STAT  Result Value Ref Range   Troponin I <0.03 <0.03 ng/mL  Differential  Result Value Ref Range   Neutrophils Relative % 58 %   Neutro Abs 4.5 1.7 - 7.7 K/uL   Lymphocytes Relative  29 %   Lymphs Abs 2.3 0.7 - 4.0 K/uL   Monocytes Relative 10 %   Monocytes Absolute 0.8 0.1 - 1.0 K/uL   Eosinophils Relative 2 %   Eosinophils Absolute 0.1 0.0 - 0.5 K/uL   Basophils Relative 1 %   Basophils Absolute 0.1 0.0 - 0.1 K/uL  CBC  Result Value Ref Range   WBC 7.9 4.0 - 10.5 K/uL   RBC 3.49 (L) 4.22 - 5.81 MIL/uL   Hemoglobin 10.5 (L) 13.0 - 17.0 g/dL   HCT 32.7 (L) 39.0 - 52.0 %   MCV 93.7 80.0 - 100.0 fL   MCH 30.1 26.0 - 34.0 pg   MCHC 32.1 30.0 - 36.0 g/dL   RDW 16.2 (H) 11.5 - 15.5 %   Platelets 192 150 - 400 K/uL   nRBC 0.0 0.0 - 0.2 %  Comprehensive metabolic panel  Result Value Ref Range  Sodium 139 135 - 145 mmol/L   Potassium 4.0 3.5 - 5.1 mmol/L   Chloride 108 98 - 111 mmol/L   CO2 24 22 - 32 mmol/L   Glucose, Bld 155 (H) 70 - 99 mg/dL   BUN 25 (H) 8 - 23 mg/dL   Creatinine, Ser 0.83 0.61 - 1.24 mg/dL   Calcium 9.1 8.9 - 10.3 mg/dL   Total Protein 6.4 (L) 6.5 - 8.1 g/dL   Albumin 3.4 (L) 3.5 - 5.0 g/dL   AST 22 15 - 41 U/L   ALT 22 0 - 44 U/L   Alkaline Phosphatase 92 38 - 126 U/L   Total Bilirubin 0.4 0.3 - 1.2 mg/dL   GFR calc non Af Amer >60 >60 mL/min   GFR calc Af Amer >60 >60 mL/min   Anion gap 7 5 - 15   C. Wynetta Emery, MD [facility]   07/28/2018 11:19 PM How to contact the [facility] Attending or Consulting provider [place] or covering provider during after hours [place], for this patient?  1. Check the care team in [facility] and look for a) attending/consulting TR[facility] provider listed and b) the [facility] team listed 2. Log into [email or web address] and use [name]'s universal password to access. If you do not have the password, please contact the hospital operator. 3. Locate the [facility] provider you are looking for under [facility] and page to a number that you can be directly reached. 4. If you still have difficulty reaching the provider, please page the [facility] (Director on Call) for the Hospitalists listed on amion for assistance.

## 2018-07-29 NOTE — ED Notes (Signed)
ED Provider at bedside. 

## 2018-07-29 NOTE — Consult Note (Signed)
Cardiology Consultation:   Patient ID: [name] [name] MRN: [id number]; DOB: 04/06/1940  Admit date: 07/28/2018 Date of Consult: 07/29/2018  Primary Care Provider: Caryl Bis, MD Primary Cardiologist: Rozann Lesches, MD     Patient Profile:   [name] [name] is a 78 y.o. male with a hx of CAD  who is being seen today for the evaluation of atrial fibrllation at the request of Dr Darrick Meigs  History of Present Illness:   [name] [name] is a 78 yo with hx of HTN, CAD (cath iin Oct 2019 with intervention (see below0  Also a hx of  PAF.  He last had a televisit with S McDOwell on 06/26/18   DOing goodat that time   Seen in ED on 4/29.20 with afib.   Cardioverted in ED and   Sent home.  Next day Plavix stopped and pt ptlaced on  ASA and Eliquis The patient says he was doing good until yesterday   Denied SOB  No CP   No palpitations    He got to go to bedroom  Felt a little lght headed, heart was racing   Came to ED    Here in ED he was found to be back in afib with RVR    He was treated with IV diltiazem  The pt says he is taking most of his meds   He has not been taking Eliquis however due to cost and he got the impression from his PCP that it was not essential     He is now in ICU   Says he is breathing OK  DOes not sense palpitations.  No CP  Past Medical History:  Diagnosis Date  . Anemia   . Arthritis   . CAD (coronary artery disease)    DES to circumflex 12/2017  . Gout   . Headache   . History of kidney stones   . Hypertension   . NSTEMI (non-ST elevated myocardial infarction) ([place]) 12/31/2017  . Paroxysmal atrial fibrillation (HCC)   . Pericardial effusion    Idiopathic, recurrent pericardial effusion s/p pericardial window.  . Prostate cancer ([place]) 2007   S/P Seed implants   . Supraventricular tachycardia ([place])   . Temporal arteritis [facility])     Past Surgical History:  Procedure Laterality Date  . CATARACT EXTRACTION W/PHACO Left 06/07/2015   Procedure: CATARACT EXTRACTION PHACO  AND INTRAOCULAR LENS PLACEMENT LEFT EYE CDE=8.00;  Surgeon: Tonny Branch, MD;  Location: [facility];  Service: Ophthalmology;  Laterality: Left;  . CATARACT EXTRACTION W/PHACO Right 06/17/2015   Procedure: CATARACT EXTRACTION PHACO AND INTRAOCULAR LENS PLACEMENT RIGHT EYE CDE=11.09;  Surgeon: Tonny Branch, MD;  Location: [facility];  Service: Ophthalmology;  Laterality: Right;  . CORONARY ANGIOPLASTY WITH STENT PLACEMENT  01/01/2018  . CORONARY STENT INTERVENTION N/A 01/01/2018   Procedure: CORONARY STENT INTERVENTION;  Surgeon: Jettie Booze, MD;  Location: [place] [facility];  Service: Cardiovascular;  Laterality: N/A;  . FRACTURE SURGERY    . INSERTION PROSTATE RADIATION SEED  2007  . KNEE ARTHROSCOPY Left   . LEFT HEART CATH AND CORONARY ANGIOGRAPHY N/A 01/01/2018   Procedure: LEFT HEART CATH AND CORONARY ANGIOGRAPHY;  Surgeon: Jettie Booze, MD;  Location: [place] [facility];  Service: Cardiovascular;  Laterality: N/A;  . ORIF TIBIA & FIBULA FRACTURES Left 2003   Distal tibial/fibula   . PERICARDIAL WINDOW  December 2008  . PERICARDIOCENTESIS  2007   hx/notes 10/19/2011      Inpatient Medications: Scheduled  Meds: . allopurinol  450 mg Oral Daily  . apixaban  5 mg Oral BID  . aspirin EC  81 mg Oral Daily  . atorvastatin  80 mg Oral q1800  . ferrous sulfate  325 mg Oral BID  . metoprolol succinate  50 mg Oral Daily  . multivitamin with minerals  1 tablet Oral Daily  . pantoprazole  40 mg Oral BID  . vitamin C  500 mg Oral BID   Continuous Infusions: . sodium chloride 10 mL/hr at 07/29/18 1123  . diltiazem (CARDIZEM) infusion Stopped (07/29/18 0949)   PRN Meds: acetaminophen **OR** acetaminophen, nitroGLYCERIN, ondansetron **OR** ondansetron (ZOFRAN) IV  Allergies:    Allergies  Allergen Reactions  . Amiodarone     rash    Social History:   Social History   Socioeconomic History  . Marital status: Married    Spouse name: Not on file  . Number of children: Not on  file  . Years of education: Not on file  . Highest education level: Not on file  Occupational History  . Not on file  Social Needs  . Financial resource strain: Not on file  . Food insecurity:    Worry: Not on file    Inability: Not on file  . Transportation needs:    Medical: Not on file    Non-medical: Not on file  Tobacco Use  . Smoking status: Former Smoker    Packs/day: 0.30    Years: 10.00    Pack years: 3.00    Types: Cigarettes    Start date: 08/06/1958    Last attempt to quit: 1968    Years since quitting: 52.3  . Smokeless tobacco: Never Used  Substance and Sexual Activity  . Alcohol use: Not Currently  . Drug use: Never  . Sexual activity: Not on file  Lifestyle  . Physical activity:    Days per week: Not on file    Minutes per session: Not on file  . Stress: Not on file  Relationships  . Social connections:    Talks on phone: Not on file    Gets together: Not on file    Attends religious service: Not on file    Active member of club or organization: Not on file    Attends meetings of clubs or organizations: Not on file    Relationship status: Not on file  . Intimate partner violence:    Fear of current or ex partner: Not on file    Emotionally abused: Not on file    Physically abused: Not on file    Forced sexual activity: Not on file  Other Topics Concern  . Not on file  Social History Narrative  . Not on file    Family History:   Family History  Problem Relation Age of Onset  . CAD Mother        MI in 19s  . Prostate cancer Father   . Prostate cancer Brother   . Lung cancer Brother      ROS:  Please see the history of present illness.  All other ROS reviewed and negative.     Physical Exam/Data:   Vitals:   07/29/18 0731 07/29/18 0930 07/29/18 1122 07/29/18 1128  BP:  108/68    Pulse: 87 92 84 79  Resp: 14 12    Temp: 98.5 F (36.9 C)   98.2 F (36.8 C)  TempSrc: Oral   Oral  SpO2: 96% 97% 97% 97%  Weight:  Height:         Intake/Output Summary (Last 24 hours) at 07/29/2018 1343 Last data filed at 07/29/2018 1123 Gross per 24 hour  Intake 148.49 ml  Output 1350 ml  Net -1201.51 ml   Last 3 Weights 07/29/2018 07/29/2018 07/28/2018  Weight (lbs) 217 lb 2.5 oz 217 lb 2.5 oz 214 lb 15.2 oz  Weight (kg) 98.5 kg 98.5 kg 97.5 kg     Body mass index is 29.45 kg/m.  General:  Well nourished, well developed, in no acute distres HEENT: normal Neck: no JVD  Vascular: No carotid bruits; FA pulses 2+ bilaterally Cardiac:  Irreg irreg   S1, S2; RRR; no murmur  Lungs:  clear to auscultation bilaterally, no wheezing, rhonchi or rales  Abd: soft, nontender, no hepatomegaly  Ext: no edema Musculoskeletal:  No deformities, BUE and BLE strength normal and equal Skin: warm and dry  Neuro:  CNs 2-12 intact, no focal abnormalities noted Psych:  Normal affect   EKG:  The EKG was personally reviewed and demonstrates:  Afib with RVR  156 bpm   Telemetry:  Telemetry was personally reviewed and demonstrates:  Afib 80s    Relevant CV Studies: Echocardiogram 01/02/2018: Study Conclusions  - Left ventricle: The cavity size was normal. There was severe focal basal hypertrophy. Systolic function was normal. The estimated ejection fraction was in the range of 55% to 60%. Wall motion was normal; there were no regional wall motion abnormalities. There was an increased relative contribution of atrial contraction to ventricular filling. Doppler parameters are consistent with abnormal left ventricular relaxation (grade 1 diastolic dysfunction). - Mitral valve: Calcified annulus. Valve area by pressure half-time: 1.59 cm^2. - Left atrium: The atrium was mildly dilated. - Pulmonic valve: There was trivial regurgitation. - Pulmonary arteries: Systolic pressure could not be accurately estimated. - Pericardium, extracardiac: A trivial pericardial effusion was identified posterior to the heart.  Cardiac  catheterization 01/01/2018:  Mid Cx lesion is 95% stenosed.  Prox LAD lesion is 25% stenosed.  Mid LAD lesion is 30% stenosed.  Prox RCA to Mid RCA lesion is 50% stenosed.  The left ventricular systolic function is normal.  LV end diastolic pressure is normal. LVEDP 12 mm Hg.  The left ventricular ejection fraction is 50-55% by visual estimate.  There is no aortic valve stenosis.  Severe tortuosity in the right subclavian which makes torquing catheters difficult. Would consider femoral approach in the future, particularly if emergent cardiac cath was needed.  A drug-eluting stent was successfully placed using a STENT SIERRA 3.25 X 15 MM.  Post intervention, there is a 0% residual stenosis.  Recommend uninterrupted dual antiplatelet therapy with Aspirin 81mg  daily and Ticagrelor 90mg  twice daily for a minimum of 12 months (ACS - Class I recommendation).  Laboratory Data:  Chemistry Recent Labs  Lab 07/24/18 2347 07/28/18 2328 07/29/18 0410  NA 138 140 139  K 3.7 4.0 4.0  CL 106 105 108  CO2 22 26 24   GLUCOSE 173* 191* 155*  BUN 31* 25* 25*  CREATININE 1.05 1.01 0.83  CALCIUM 9.1 9.6 9.1  GFRNONAA >60 >60 >60  GFRAA >60 >60 >60  ANIONGAP 10 9 7     Recent Labs  Lab 07/29/18 0410  PROT 6.4*  ALBUMIN 3.4*  AST 22  ALT 22  ALKPHOS 92  BILITOT 0.4   Hematology Recent Labs  Lab 07/24/18 2347 07/28/18 2328 07/29/18 0410  WBC 6.1 7.8 7.9  RBC 3.59* 3.82* 3.49*  HGB 10.8* 11.4* 10.5*  HCT 33.1* 35.6* 32.7*  MCV 92.2 93.2 93.7  MCH 30.1 29.8 30.1  MCHC 32.6 32.0 32.1  RDW 16.2* 16.2* 16.2*  PLT 180 199 192   Cardiac Enzymes Recent Labs  Lab 07/28/18 2328  TROPONINI <0.03   No results for input(s): TROPIPOC in the last 168 hours.  BNPNo results for input(s): BNP, PROBNP in the last 168 hours.  DDimer No results for input(s): DDIMER in the last 168 hours.  Radiology/Studies:  Dg Chest Portable 1 View  Result Date: 07/28/2018 CLINICAL DATA:   Tachycardia EXAM: PORTABLE CHEST 1 VIEW COMPARISON:  January 05, 2014 FINDINGS: The heart size and mediastinal contours are within normal limits. Both lungs are clear. The visualized skeletal structures are unremarkable. IMPRESSION: No active disease. Electronically Signed   By: Dorise Bullion III M.D   On: 07/28/2018 23:56    Assessment and Plan:   PT is a 78 yo with hx of CAD (s/p PTCA/DES in October 2019)   ALso hx of PAF   REcently presented with afib with RVR   (06/2018)   Cardioverted.  Placed on NOAC    Presents today with palpitations   Found to be in Afib with RVR  1   PAF  The patients rates are now better  He is feeling OK at rest.    Note that he has not taking any anticoag since d/c   Couldn't afford It sounds like afib may have started last night but he does not sense now when rates better    FOr now would recomm rate control   WIll increase toprol and add diltiazem   Follow BP    Will review with pharmacy anticoagulation options/cost of these (Eliuqis vs Xarelto vs coumadin)  On Eliquis now.    2   CAD  PT with intervention with stent in OCtober 2019   No symtpoms of angina   Keep on ASA and anticoagulatoin    3   HL  Keep on statin   For questions or updates, please contact [place] Please consult [email or web address] for contact info under     Signed, Dorris Carnes, MD  07/29/2018 1:43 PM

## 2018-07-29 NOTE — ED Provider Notes (Signed)
[facility] EMERGENCY DEPARTMENT Provider Note   CSN: [id number] Arrival date & time: 07/28/18  2316    History   Chief Complaint Chief Complaint  Patient presents with  . Tachycardia    HPI [name] [name] is a 78 y.o. male.     Patient complains of palpitations.   Palpitations  Palpitations quality:  Irregular Onset quality:  Sudden Timing:  Constant Chronicity:  Recurrent Context: not anxiety   Relieved by:  Nothing Worsened by:  Nothing Ineffective treatments:  None tried Associated symptoms: no back pain, no chest pain and no cough   Risk factors: no diabetes mellitus     Past Medical History:  Diagnosis Date  . Anemia   . Arthritis   . CAD (coronary artery disease)    DES to circumflex 12/2017  . Gout   . Headache   . History of kidney stones   . Hypertension   . NSTEMI (non-ST elevated myocardial infarction) ([place]) 12/31/2017  . Paroxysmal atrial fibrillation (HCC)   . Pericardial effusion    Idiopathic, recurrent pericardial effusion s/p pericardial window.  . Prostate cancer ([place]) 2007   S/P Seed implants   . Supraventricular tachycardia ([place])   . Temporal arteritis [facility])     Patient Active Problem List   Diagnosis Date Noted  . Status post coronary artery stent placement   . Hyperlipidemia 01/02/2018  . NSTEMI (non-ST elevated myocardial infarction) ([place]) 12/31/2017  . Hypertensive urgency   . PAF (paroxysmal atrial fibrillation) ([place])   . Sinus bradycardia 01/14/2014  . Pericardial effusion 12/31/2013  . Paroxysmal atrial fibrillation (HCC)   . Supraventricular tachycardia ([place]) 10/17/2010    Past Surgical History:  Procedure Laterality Date  . CATARACT EXTRACTION W/PHACO Left 06/07/2015   Procedure: CATARACT EXTRACTION PHACO AND INTRAOCULAR LENS PLACEMENT LEFT EYE CDE=8.00;  Surgeon: Tonny Branch, MD;  Location: [facility];  Service: Ophthalmology;  Laterality: Left;  . CATARACT EXTRACTION W/PHACO Right 06/17/2015   Procedure: CATARACT  EXTRACTION PHACO AND INTRAOCULAR LENS PLACEMENT RIGHT EYE CDE=11.09;  Surgeon: Tonny Branch, MD;  Location: [facility];  Service: Ophthalmology;  Laterality: Right;  . CORONARY ANGIOPLASTY WITH STENT PLACEMENT  01/01/2018  . CORONARY STENT INTERVENTION N/A 01/01/2018   Procedure: CORONARY STENT INTERVENTION;  Surgeon: Jettie Booze, MD;  Location: [place] [facility];  Service: Cardiovascular;  Laterality: N/A;  . FRACTURE SURGERY    . INSERTION PROSTATE RADIATION SEED  2007  . KNEE ARTHROSCOPY Left   . LEFT HEART CATH AND CORONARY ANGIOGRAPHY N/A 01/01/2018   Procedure: LEFT HEART CATH AND CORONARY ANGIOGRAPHY;  Surgeon: Jettie Booze, MD;  Location: [place] [facility];  Service: Cardiovascular;  Laterality: N/A;  . ORIF TIBIA & FIBULA FRACTURES Left 2003   Distal tibial/fibula   . PERICARDIAL WINDOW  December 2008  . PERICARDIOCENTESIS  2007   hx/notes 10/19/2011        Home Medications    Prior to Admission medications   Medication Sig Start Date End Date Taking? Authorizing Provider  allopurinol (ZYLOPRIM) 300 MG tablet Take 450 mg by mouth daily.     [provider]  apixaban (ELIQUIS) 5 MG TABS tablet Take 1 tablet (5 mg total) by mouth 2 (two) times daily. 04/23/76   Delora Fuel, MD  aspirin 81 MG tablet Take 81 mg by mouth daily.    [provider]  atorvastatin (LIPITOR) 80 MG tablet TAKE 1 TABLET BY MOUTH ONCE DAILY AT  6PM 07/01/18   Satira Sark,  MD  benazepril (LOTENSIN) 40 MG tablet Take 1 tablet (40 mg total) by mouth daily. 07/01/18   Satira Sark, MD  ferrous sulfate 325 (65 FE) MG tablet Take 325 mg by mouth 2 (two) times daily.    [provider]  hydrALAZINE (APRESOLINE) 100 MG tablet Take 100 mg by mouth 2 (two) times daily.    [provider]  hydrochlorothiazide (HYDRODIURIL) 12.5 MG tablet Take 12.5 mg by mouth daily. for high blood pressure 12/28/17   [provider]  metoprolol succinate (TOPROL-XL) 50 MG  24 hr tablet TAKE 1 TABLET BY MOUTH ONCE DAILY WITH FOOD OR  IMMEDIATELY  FOLLOWING  THE  SAME  MEAL 01/24/18   Satira Sark, MD  Multiple Vitamin (MULTIVITAMIN) tablet Take 1 tablet by mouth daily.    [provider]  nitroGLYCERIN (NITROSTAT) 0.4 MG SL tablet Place 1 tablet (0.4 mg total) under the tongue every 5 (five) minutes x 3 doses as needed for chest pain. 01/02/18   Cheryln Manly, NP  pantoprazole (PROTONIX) 40 MG tablet TAKE 1 TABLET BY MOUTH ONCE DAILY Patient taking differently: Take 40 mg by mouth 2 (two) times daily.  12/12/17   Satira Sark, MD  spironolactone (ALDACTONE) 25 MG tablet Take 25 mg by mouth 2 (two) times daily.    [provider]  vitamin C (ASCORBIC ACID) 500 MG tablet Take 500 mg by mouth 2 (two) times daily.     [provider]    Family History Family History  Problem Relation Age of Onset  . CAD Mother        MI in 60s  . Prostate cancer Father   . Prostate cancer Brother   . Lung cancer Brother     Social History Social History   Tobacco Use  . Smoking status: Former Smoker    Packs/day: 0.30    Years: 10.00    Pack years: 3.00    Types: Cigarettes    Start date: 08/06/1958    Last attempt to quit: 1968    Years since quitting: 52.3  . Smokeless tobacco: Never Used  Substance Use Topics  . Alcohol use: Not Currently  . Drug use: Never     Allergies   Amiodarone   Review of Systems Review of Systems  Constitutional: Negative for appetite change and fatigue.  HENT: Negative for congestion, ear discharge and sinus pressure.   Eyes: Negative for discharge.  Respiratory: Negative for cough.   Cardiovascular: Positive for palpitations. Negative for chest pain.  Gastrointestinal: Negative for abdominal pain and diarrhea.  Genitourinary: Negative for frequency and hematuria.  Musculoskeletal: Negative for back pain.  Skin: Negative for rash.  Neurological: Negative for seizures and headaches.   Psychiatric/Behavioral: Negative for hallucinations.     Physical Exam Updated Vital Signs BP 132/72   Pulse 100   Temp 98.1 F (36.7 C) (Oral)   Resp 15   Ht 6' (1.829 m)   Wt 97.5 kg   SpO2 94%   BMI 29.15 kg/m   Physical Exam Vitals signs and nursing note reviewed.  Constitutional:      Appearance: He is well-developed.  HENT:     Head: Normocephalic.     Nose: Nose normal.  Eyes:     General: No scleral icterus.    Conjunctiva/sclera: Conjunctivae normal.  Neck:     Musculoskeletal: Neck supple.     Thyroid: No thyromegaly.  Cardiovascular:     Heart sounds: No murmur.  No friction rub. No gallop.      Comments: Rapid irregular heartbeat Pulmonary:     Breath sounds: No stridor. No wheezing or rales.  Chest:     Chest wall: No tenderness.  Abdominal:     General: There is no distension.     Tenderness: There is no abdominal tenderness. There is no rebound.  Musculoskeletal: Normal range of motion.  Lymphadenopathy:     Cervical: No cervical adenopathy.  Skin:    Findings: No erythema or rash.  Neurological:     Mental Status: He is oriented to person, place, and time.     Motor: No abnormal muscle tone.     Coordination: Coordination normal.  Psychiatric:        Behavior: Behavior normal.      ED Treatments / Results  Labs (all labs ordered are listed, but only abnormal results are displayed) Labs Reviewed  BASIC METABOLIC PANEL - Abnormal; Notable for the following components:      Result Value   Glucose, Bld 191 (*)    BUN 25 (*)    All other components within normal limits  CBC - Abnormal; Notable for the following components:   RBC 3.82 (*)    Hemoglobin 11.4 (*)    HCT 35.6 (*)    RDW 16.2 (*)    All other components within normal limits  TROPONIN I  DIFFERENTIAL    EKG None  Radiology Dg Chest Portable 1 View  Result Date: 07/28/2018 CLINICAL DATA:  Tachycardia EXAM: PORTABLE CHEST 1 VIEW COMPARISON:  January 05, 2014 FINDINGS:  The heart size and mediastinal contours are within normal limits. Both lungs are clear. The visualized skeletal structures are unremarkable. IMPRESSION: No active disease. Electronically Signed   By: Dorise Bullion III M.D   On: 07/28/2018 23:56    Procedures Procedures (including critical care time)  Medications Ordered in ED Medications  diltiazem (CARDIZEM) 100 mg in dextrose 5 % 100 mL (1 mg/mL) infusion (5 mg/hr Intravenous New Bag/Given 07/28/18 2343)  sodium chloride flush (NS) 0.9 % injection 3 mL (3 mLs Intravenous Given 07/28/18 2334)  diltiazem (CARDIZEM) injection 10 mg (10 mg Intravenous Given 07/28/18 2340)     Initial Impression / Assessment and Plan / ED Course  I have reviewed the triage vital signs and the nursing notes.  Pertinent labs & imaging results that were available during my care of the patient were reviewed by me and considered in my medical decision making (see chart for details).    .edcr CRITICAL CARE Performed by: Milton Ferguson Total critical care time:45 minutes Critical care time was exclusive of separately billable procedures and treating other patients. Critical care was necessary to treat or prevent imminent or life-threatening deterioration. Critical care was time spent personally by me on the following activities: development of treatment plan with patient and/or surrogate as well as nursing, discussions with consultants, evaluation of patient's response to treatment, examination of patient, obtaining history from patient or surrogate, ordering and performing treatments and interventions, ordering and review of laboratory studies, ordering and review of radiographic studies, pulse oximetry and re-evaluation of patient's condition.    Patient with rapid atrial fib that was controlled with Cardizem drip.  He will be admitted to medicine with cardiology consult in the a.m. . Final Clinical Impressions(s) / ED Diagnoses   Final diagnoses:  Atrial  fibrillation with RVR [facility])    ED Discharge Orders    None       Mariachristina Holle,  Broadus John, MD 07/29/18 9292

## 2018-07-29 NOTE — TOC Initial Note (Signed)
Transition of Care [facility]) - Initial/Assessment Note    Patient Details  Name: [name] [name] MRN: [id number] Date of Birth: 1940-07-22  Transition of Care [facility]) CM/SW Contact:    Shade Flood, LCSW Phone Number: 07/29/2018, 1:19 PM  Clinical Narrative:                  Received consult to assist pt with Eliquis costs. Met with pt who states that it was going to cost him $297 to fill the Rx so he didn't fill it. TOC benefits check indicated that pt should have a $47 copay for this tier medication. Insurance indicated pt deductible has been met. Since this was only four days ago that pt tried to fill it, it's possible pt could be in donut hole with Medicare.   Provided Eliquis coupon to pt for 30 day free supply. Will follow up with pharmacy at dc before pt leaves to have them run the Rx again to see if the copay remains that high.  Pt aware LCSW will follow up tomorrow.   Expected Discharge Plan: Home/Self Care Barriers to Discharge: No Barriers Identified   Patient Goals and CMS Choice        Expected Discharge Plan and Services Expected Discharge Plan: Home/Self Care       Living arrangements for the past 2 months: Single Family Home                                      Prior Living Arrangements/Services Living arrangements for the past 2 months: Single Family Home Lives with:: Spouse Patient language and need for interpreter reviewed:: Yes Do you feel safe going back to the place where you live?: Yes      Need for Family Participation in Patient Care: No (Comment) Care giver support system in place?: Yes (comment)   Criminal Activity/Legal Involvement Pertinent to Current Situation/Hospitalization: No - Comment as needed  Activities of Daily Living Home Assistive Devices/Equipment: Blood pressure cuff, Eyeglasses, Hearing aid ADL Screening (condition at time of admission) Patient's cognitive ability adequate to safely complete daily activities?: Yes Is  the patient deaf or have difficulty hearing?: No Does the patient have difficulty seeing, even when wearing glasses/contacts?: No Does the patient have difficulty concentrating, remembering, or making decisions?: No Patient able to express need for assistance with ADLs?: Yes Does the patient have difficulty dressing or bathing?: No Independently performs ADLs?: Yes (appropriate for developmental age) Does the patient have difficulty walking or climbing stairs?: No Weakness of Legs: None Weakness of Arms/Hands: None  Permission Sought/Granted                  Emotional Assessment Appearance:: Appears stated age Attitude/Demeanor/Rapport: Engaged Affect (typically observed): Pleasant, Calm Orientation: : Oriented to Self, Oriented to Place, Oriented to  Time, Oriented to Situation Alcohol / Substance Use: Not Applicable Psych Involvement: No (comment)  Admission diagnosis:  Atrial fibrillation with RVR ([place]) [I48.91] Patient Active Problem List   Diagnosis Date Noted  . Atrial fibrillation with RVR ([place]) 07/29/2018  . CAD (coronary artery disease) 07/29/2018  . Status post coronary artery stent placement   . Hyperlipidemia 01/02/2018  . NSTEMI (non-ST elevated myocardial infarction) ([place]) 12/31/2017  . Hypertensive urgency   . PAF (paroxysmal atrial fibrillation) ([place])   . Sinus bradycardia 01/14/2014  . Pericardial effusion 12/31/2013  . Paroxysmal atrial fibrillation (HCC)   . Supraventricular tachycardia (  [facility]) 10/17/2010   PCP:  Caryl Bis, MD Pharmacy:   [facility] [address] [place] [address] Phone: [phone] Fax: [phone]  [facility] [place] [place] - [address] [place] [address] Phone: [phone] Fax: [phone]     Social Determinants of Health (SDOH) Interventions    Readmission Risk Interventions No flowsheet data found.

## 2018-07-30 DIAGNOSIS — Z955 Presence of coronary angioplasty implant and graft: Secondary | ICD-10-CM

## 2018-07-30 DIAGNOSIS — E782 Mixed hyperlipidemia: Secondary | ICD-10-CM

## 2018-07-30 DIAGNOSIS — I480 Paroxysmal atrial fibrillation: Secondary | ICD-10-CM

## 2018-07-30 NOTE — Progress Notes (Signed)
Progress Note  Patient Name: [name] [name] Date of Encounter: 07/30/2018  Primary Cardiologist: Rozann Lesches, MD    Subjective   Still with some palpitations no chest pain or dyspnea   Inpatient Medications    Scheduled Meds: . allopurinol  450 mg Oral Daily  . apixaban  5 mg Oral BID  . aspirin EC  81 mg Oral Daily  . atorvastatin  80 mg Oral q1800  . diltiazem  60 mg Oral Q8H  . ferrous sulfate  325 mg Oral BID  . metoprolol succinate  75 mg Oral Daily  . multivitamin with minerals  1 tablet Oral Daily  . pantoprazole  40 mg Oral BID  . vitamin C  500 mg Oral BID   Continuous Infusions: . sodium chloride Stopped (07/29/18 1243)   PRN Meds: acetaminophen **OR** acetaminophen, nitroGLYCERIN, ondansetron **OR** ondansetron (ZOFRAN) IV   Vital Signs    Vitals:   07/30/18 0600 07/30/18 0612 07/30/18 0615 07/30/18 0719  BP: (!) 78/57 (!) 131/96 (!) 131/96   Pulse: 94  (!) 171 (!) 31  Resp: 16  17 10   Temp:    98.2 F (36.8 C)  TempSrc:    Oral  SpO2: 94%  97% 98%  Weight:      Height:        Intake/Output Summary (Last 24 hours) at 07/30/2018 0800 Last data filed at 07/30/2018 0600 Gross per 24 hour  Intake 320.34 ml  Output 2000 ml  Net -1679.66 ml   Last 3 Weights 07/30/2018 07/29/2018 07/29/2018  Weight (lbs) 212 lb 15.4 oz 217 lb 2.5 oz 217 lb 2.5 oz  Weight (kg) 96.6 kg 98.5 kg 98.5 kg      Telemetry    afib rates 100  - Personally Reviewed  ECG    afib nonspecific ST changes  - Personally Reviewed  Physical Exam   GEN: No acute distress.   Neck: No JVD Cardiac: RRR, no murmurs, rubs, or gallops.  Respiratory: Clear to auscultation bilaterally. GI: Soft, nontender, non-distended  MS: No edema; No deformity. Neuro:  Nonfocal  Psych: Normal affect   Labs    Chemistry Recent Labs  Lab 07/24/18 2347 07/28/18 2328 07/29/18 0410  NA 138 140 139  K 3.7 4.0 4.0  CL 106 105 108  CO2 22 26 24   GLUCOSE 173* 191* 155*  BUN 31* 25* 25*   CREATININE 1.05 1.01 0.83  CALCIUM 9.1 9.6 9.1  PROT  --   --  6.4*  ALBUMIN  --   --  3.4*  AST  --   --  22  ALT  --   --  22  ALKPHOS  --   --  92  BILITOT  --   --  0.4  GFRNONAA >60 >60 >60  GFRAA >60 >60 >60  ANIONGAP 10 9 7      Hematology Recent Labs  Lab 07/24/18 2347 07/28/18 2328 07/29/18 0410  WBC 6.1 7.8 7.9  RBC 3.59* 3.82* 3.49*  HGB 10.8* 11.4* 10.5*  HCT 33.1* 35.6* 32.7*  MCV 92.2 93.2 93.7  MCH 30.1 29.8 30.1  MCHC 32.6 32.0 32.1  RDW 16.2* 16.2* 16.2*  PLT 180 199 192    Cardiac Enzymes Recent Labs  Lab 07/28/18 2328  TROPONINI <0.03   No results for input(s): TROPIPOC in the last 168 hours.   BNPNo results for input(s): BNP, PROBNP in the last 168 hours.   DDimer No results for input(s): DDIMER in the last  168 hours.   Radiology    Dg Chest Portable 1 View  Result Date: 07/28/2018 CLINICAL DATA:  Tachycardia EXAM: PORTABLE CHEST 1 VIEW COMPARISON:  January 05, 2014 FINDINGS: The heart size and mediastinal contours are within normal limits. Both lungs are clear. The visualized skeletal structures are unremarkable. IMPRESSION: No active disease. Electronically Signed   By: Dorise Bullion III M.D   On: 07/28/2018 23:56    Cardiac Studies   Echo 01/02/18 EF 55-60% mild LAE Cath 01/01/18 DES to circumflex moderate proximal RCA disease   Patient Profile     78 y.o. male with CAD DES to circumflex 12/2017 EF preserved PAF post [facility] Not on anticoagulation admitted with rapid afib  Assessment & Plan    1. Afib:  Increase Toprol to 100 mg continue cardizem 60 q8. Ok to transfer to floor His eliquis will be $50/month after deductible so and d/c on it. If rates better controlled d/c in am in Toprol 100 mg daily Cardizem 240 mg CD daily in afternoon and eliquis 5 bid will arrange outpatient f/u with Dr Domenic Polite Consider [facility] in 3 weeks if he does not convert on his own Only mild LAE on echo 2. CAD: no angina DES to circumflex 12/2017 did not have DAT  for 12 months admitted on ASA ? Should be no ASA And plavix 75 mg instead will f/u with Dr Domenic Polite to discuss       For questions or updates, please contact [facility] Please consult [email or web address] for contact info under        Signed, Jenkins Rouge, MD  07/30/2018, 8:00 AM

## 2018-07-30 NOTE — Discharge Instructions (Signed)
Your insurance coverage with [employer] has a $250 deductible per plan year for Tier 3 drugs. Eliquis is a Tier 3 drug. Since you don't take any other Tier 3 drugs, this is the first time you are getting a medicine in this tier and so the first time you fill it, you will pay the $250 deductible plus the $47 co-pay that is required for Tier 3 drugs. For the rest of the year, your re-fills will cost $47. If you continue on this medication, remember that the first time you fill it in each new plan year, you will be paying whatever the deductible is for that year plus the required co-pay for that medication.

## 2018-07-30 NOTE — Progress Notes (Addendum)
PROGRESS NOTE  [name] [name]  SFK:[id number]  DOB: 08-May-1940  DOA: 07/28/2018 PCP: Caryl Bis, MD  Brief Admission Hx: 78 year old gentleman with hypertension, CAD, status post PCI with DES to circumflex, PAF status post recent electrical cardioversion 07/24/2018.  The patient discharged home after that on aspirin and apixaban.  Unfortunately he had a difficult time getting his apixaban prescription filled due to cost.  He presented to the ED with recurrent palpitations and noted to be in A. fib with RVR.  MDM/Assessment & Plan:   1. Atrial fibrillation with RVR- recurrent episodes of paroxysmal atrial fibrillation.  He is on apixaban for full anticoagulation.  The social worker has spoken with him and he will be able to afford the cost of the apixaban.  I have spoken with him and confirmed that he will be able to afford the medication.  Cardiology has seen him.  He has been weaned off the Cardizem infusion.  He is on oral Toprol-XL and Cardizem.  His medications have been adjusted this morning.  The plan is for him to transfer to the telemetry unit and possibly discharge home 07/31/2018 if stable.  I really appreciate the assistance of the inpatient cardiology service.   The patient is being monitored further on telemetry to be sure he has no further episodes of bradycardia and tolerates new doses of his cardiac medications.   2. Essential hypertension- his blood pressures are controlled on Toprol-XL 75 mg daily plus diltiazem 60 mg 3 times daily.  Plan is to transition him over to Cardizem CD 240 mg daily 07/31/2018. 3. CAD - Pt is on aspirin for antiplatelet therapy.   4. Dyslipidemia - continue atorvastatin 80 mg daily.  5. Iron Deficiency Anemia - He was resumed on home ferrous sulfate daily.   DVT prophylaxis: apixaban  Code Status: Full  Family Communication: call to wife  Disposition Plan: transfer to telemetry, continue inpatient treatments  Consultants:  Cardiology   Procedures:     Antimicrobials:     Subjective: Pt denies palpitations, chest pain and SOB.    Objective: Vitals:   07/30/18 0800 07/30/18 1010 07/30/18 1100 07/30/18 1113  BP: 130/89 133/65 116/83   Pulse: (!) 104 (!) 102 77 65  Resp: 19 16 13 19   Temp:    98.5 F (36.9 C)  TempSrc:    Oral  SpO2: 97% 97% 98% 97%  Weight:      Height:        Intake/Output Summary (Last 24 hours) at 07/30/2018 1147 Last data filed at 07/30/2018 0600 Gross per 24 hour  Intake 253.34 ml  Output 2000 ml  Net -1746.66 ml   Filed Weights   07/29/18 0233 07/29/18 0500 07/30/18 0500  Weight: 98.5 kg 98.5 kg 96.6 kg   REVIEW OF SYSTEMS  As per history otherwise all reviewed and reported negative  Exam:  General exam: awake, alert, NAD, cooperative.  Respiratory system: Clear. No increased work of breathing. Cardiovascular system: normal S1 & S2 heard. No JVD, murmurs, gallops, clicks or pedal edema. Gastrointestinal system: Abdomen is nondistended, soft and nontender. Normal bowel sounds heard. Central nervous system: Alert and oriented. No focal neurological deficits. Extremities: no CCE.  Data Reviewed: Basic Metabolic Panel: Recent Labs  Lab 07/24/18 2347 07/28/18 2328 07/29/18 0410  NA 138 140 139  K 3.7 4.0 4.0  CL 106 105 108  CO2 22 26 24   GLUCOSE 173* 191* 155*  BUN 31* 25* 25*  CREATININE 1.05 1.01 0.83  CALCIUM 9.1 9.6 9.1  MG 2.2  --   --    Liver Function Tests: Recent Labs  Lab 07/29/18 0410  AST 22  ALT 22  ALKPHOS 92  BILITOT 0.4  PROT 6.4*  ALBUMIN 3.4*   No results for input(s): LIPASE, AMYLASE in the last 168 hours. No results for input(s): AMMONIA in the last 168 hours. CBC: Recent Labs  Lab 07/24/18 2347 07/28/18 2328 07/29/18 0410  WBC 6.1 7.8 7.9  NEUTROABS 3.6 4.5  --   HGB 10.8* 11.4* 10.5*  HCT 33.1* 35.6* 32.7*  MCV 92.2 93.2 93.7  PLT 180 199 192   Cardiac Enzymes: Recent Labs  Lab 07/28/18 2328  TROPONINI <0.03   CBG (last 3)  No  results for input(s): GLUCAP in the last 72 hours. Recent Results (from the past 240 hour(s))  MRSA PCR Screening     Status: None   Collection Time: 07/29/18  2:20 AM  Result Value Ref Range Status   MRSA by PCR NEGATIVE NEGATIVE Final    Comment:        The GeneXpert MRSA Assay (FDA approved for NASAL specimens only), is one component of a comprehensive MRSA colonization surveillance program. It is not intended to diagnose MRSA infection nor to guide or monitor treatment for MRSA infections. Performed at [facility], [address] [place], [place] [address]     Studies: Dg Chest Portable 1 View  Result Date: 07/28/2018 CLINICAL DATA:  Tachycardia EXAM: PORTABLE CHEST 1 VIEW COMPARISON:  January 05, 2014 FINDINGS: The heart size and mediastinal contours are within normal limits. Both lungs are clear. The visualized skeletal structures are unremarkable. IMPRESSION: No active disease. Electronically Signed   By: Dorise Bullion III M.D   On: 07/28/2018 23:56   Scheduled Meds: . allopurinol  450 mg Oral Daily  . apixaban  5 mg Oral BID  . aspirin EC  81 mg Oral Daily  . atorvastatin  80 mg Oral q1800  . diltiazem  60 mg Oral Q8H  . ferrous sulfate  325 mg Oral BID  . metoprolol succinate  75 mg Oral Daily  . multivitamin with minerals  1 tablet Oral Daily  . pantoprazole  40 mg Oral BID  . vitamin C  500 mg Oral BID   Continuous Infusions: . sodium chloride Stopped (07/29/18 1243)   Active Problems:   Paroxysmal atrial fibrillation (HCC)   Hyperlipidemia   Status post coronary artery stent placement   Atrial fibrillation with RVR (HCC)   CAD (coronary artery disease)  Critical care Time spent: 31 mins  Irwin Brakeman, MD [facility] 07/30/2018, 11:47 AM    LOS: 1 day  How to contact the [facility] Attending or Consulting provider [place] or covering provider during after hours [place], for this patient?  1. Check the care team in [facility] and look for a)  attending/consulting TRH provider listed and b) the [facility] team listed 2. Log into [email or web address] and use [place]'s universal password to access. If you do not have the password, please contact the hospital operator. 3. Locate the [facility] provider you are looking for under [facility] and page to a number that you can be directly reached. 4. If you still have difficulty reaching the provider, please page the [facility] (Director on Call) for the Hospitalists listed on amion for assistance.

## 2018-07-30 NOTE — TOC Progression Note (Signed)
Transition of Care [facility]) - Progression Note    Patient Details  Name: [name] [name] MRN: [id number] Date of Birth: 1940/10/09  Transition [facility] Care [facility]) CM/SW Contact  Shade Flood, LCSW Phone Number: 07/30/2018, 11:37 AM  Clinical Narrative:     Followed up with pt's insurance and pharmacy today to get more detailed information on cost of Eliquis. Was informed by pt's insurance that pt has a $250 deductible that has not yet been met so the first time he fills Eliquis, pt's cost will be $250 plus the usual $47 co-pay. Any time he fills it the rest of the year, it will only be the $47 co-pay. Explained this to patient and he indicated that he could manage that cost.   Pt also mentioned he would be on a new medication at dc and he was wondering the cost of this medication. Upon review of pt's chart, it appears he is referring to the Cardizem. Looking at [employer] plan information on line, it appears that the generic co-pay would be $5. Explained to LCSW's best understanding what it appears this medication will cost. Pt very appreciative. Pt does not feel he will have any other TOC needs for dc.   TOC will be available if needs arise.  Expected Discharge Plan: Home/Self Care Barriers to Discharge: No Barriers Identified  Expected Discharge Plan and Services Expected Discharge Plan: Home/Self Care       Living arrangements for the past 2 months: Single Family Home                                       Social Determinants of Health (SDOH) Interventions    Readmission Risk Interventions Readmission Risk Prevention Plan 07/30/2018  Medication Screening Complete  Transportation Screening Complete  Some recent data might be hidden

## 2018-07-31 ENCOUNTER — Telehealth: Payer: Self-pay | Admitting: Cardiology

## 2018-07-31 DIAGNOSIS — I2510 Atherosclerotic heart disease of native coronary artery without angina pectoris: Secondary | ICD-10-CM

## 2018-07-31 MED ORDER — CLOPIDOGREL BISULFATE 75 MG PO TABS
75.0000 mg | ORAL_TABLET | Freq: Every day | ORAL | Status: DC
  Administered 2018-07-31: 75 mg via ORAL
  Filled 2018-07-31: qty 1

## 2018-07-31 MED ORDER — DILTIAZEM HCL ER COATED BEADS 180 MG PO CP24
180.0000 mg | ORAL_CAPSULE | Freq: Every day | ORAL | Status: DC
  Administered 2018-07-31: 180 mg via ORAL
  Filled 2018-07-31: qty 1

## 2018-07-31 MED ORDER — CLOPIDOGREL BISULFATE 75 MG PO TABS: 75.0000 mg | ORAL_TABLET | Freq: Every day | ORAL | 0 refills | Status: DC

## 2018-07-31 MED ORDER — METOPROLOL SUCCINATE ER 100 MG PO TB24: 100.0000 mg | ORAL_TABLET | Freq: Every day | ORAL | 1 refills | Status: DC

## 2018-07-31 MED ORDER — METOPROLOL SUCCINATE ER 50 MG PO TB24: 100.0000 mg | ORAL_TABLET | Freq: Every day | ORAL | Status: DC

## 2018-07-31 MED ORDER — DILTIAZEM HCL ER COATED BEADS 180 MG PO CP24: 180.0000 mg | ORAL_CAPSULE | Freq: Every day | ORAL | 1 refills | Status: DC

## 2018-07-31 NOTE — Telephone Encounter (Signed)
Called pt's wife. No answer. Left message for her to return call. While looking through pt chart, I saw a phone note from 07/25/2018 that stated to DC plavix and just stay on eiquis & aspirin for now. Is this still what I should advise pt on. Please advise.

## 2018-07-31 NOTE — Progress Notes (Signed)
Progress Note  Patient Name: [name] [name] Date of Encounter: 07/31/2018  Primary Cardiologist: Rozann Lesches, MD    Subjective   Ready for d/c Discussed potential need for [facility] in 3 weeks if he does not convert spontaneously   Inpatient Medications    Scheduled Meds: . allopurinol  450 mg Oral Daily  . apixaban  5 mg Oral BID  . aspirin EC  81 mg Oral Daily  . atorvastatin  80 mg Oral q1800  . diltiazem  60 mg Oral Q8H  . ferrous sulfate  325 mg Oral BID  . metoprolol succinate  75 mg Oral Daily  . multivitamin with minerals  1 tablet Oral Daily  . pantoprazole  40 mg Oral BID  . vitamin C  500 mg Oral BID   Continuous Infusions: . sodium chloride Stopped (07/29/18 1243)   PRN Meds: acetaminophen **OR** acetaminophen, nitroGLYCERIN, ondansetron **OR** ondansetron (ZOFRAN) IV   Vital Signs    Vitals:   07/30/18 2039 07/30/18 2225 07/31/18 0524 07/31/18 0525  BP:  131/67 95/83 95/83   Pulse:  98 66 69  Resp:  20    Temp:  98.4 F (36.9 C) 98.3 F (36.8 C) 98.3 F (36.8 C)  TempSrc:  Oral Oral Oral  SpO2: 96% 98%  (!) 87%  Weight:      Height:        Intake/Output Summary (Last 24 hours) at 07/31/2018 0854 Last data filed at 07/30/2018 2244 Gross per 24 hour  Intake 480 ml  Output 900 ml  Net -420 ml   Last 3 Weights 07/30/2018 07/29/2018 07/29/2018  Weight (lbs) 212 lb 15.4 oz 217 lb 2.5 oz 217 lb 2.5 oz  Weight (kg) 96.6 kg 98.5 kg 98.5 kg      Telemetry    afib rates 80 s   - Personally Reviewed  ECG    afib nonspecific ST changes  - Personally Reviewed  Physical Exam   GEN: No acute distress.   Neck: No JVD Cardiac: RRR, no murmurs, rubs, or gallops.  Respiratory: Clear to auscultation bilaterally. GI: Soft, nontender, non-distended  MS: No edema; No deformity. Neuro:  Nonfocal  Psych: Normal affect   Labs    Chemistry Recent Labs  Lab 07/24/18 2347 07/28/18 2328 07/29/18 0410  NA 138 140 139  K 3.7 4.0 4.0  CL 106 105 108  CO2 22 26  24   GLUCOSE 173* 191* 155*  BUN 31* 25* 25*  CREATININE 1.05 1.01 0.83  CALCIUM 9.1 9.6 9.1  PROT  --   --  6.4*  ALBUMIN  --   --  3.4*  AST  --   --  22  ALT  --   --  22  ALKPHOS  --   --  92  BILITOT  --   --  0.4  GFRNONAA >60 >60 >60  GFRAA >60 >60 >60  ANIONGAP 10 9 7      Hematology Recent Labs  Lab 07/24/18 2347 07/28/18 2328 07/29/18 0410  WBC 6.1 7.8 7.9  RBC 3.59* 3.82* 3.49*  HGB 10.8* 11.4* 10.5*  HCT 33.1* 35.6* 32.7*  MCV 92.2 93.2 93.7  MCH 30.1 29.8 30.1  MCHC 32.6 32.0 32.1  RDW 16.2* 16.2* 16.2*  PLT 180 199 192    Cardiac Enzymes Recent Labs  Lab 07/28/18 2328  TROPONINI <0.03   No results for input(s): TROPIPOC in the last 168 hours.   BNPNo results for input(s): BNP, PROBNP in the last 168  hours.   DDimer No results for input(s): DDIMER in the last 168 hours.   Radiology    No results found.  Cardiac Studies   Echo 01/02/18 EF 55-60% mild LAE Cath 01/01/18 DES to circumflex moderate proximal RCA disease   Patient Profile     78 y.o. male with CAD DES to circumflex 12/2017 EF preserved PAF post [facility] Not on anticoagulation admitted with rapid afib  Assessment & Plan    1. Afib:  Rates improved in 80's with Toprol 100 mg daily Cardizem 240 mg CD daily in afternoon and eliquis 5 bid will arrange outpatient f/u with Dr Domenic Polite Consider [facility] in 3 weeks if he does not convert on his own Only mild LAE on echo 2. CAD: no angina DES to circumflex 12/2017 did not have DAT for 12 months admitted on ASA ? Should be no ASA And plavix 75 mg instead will f/u with Dr Domenic Polite to discuss     Will sign off   For questions or updates, please contact [facility] Please consult [email or web address] for contact info under        Signed, Jenkins Rouge, MD  07/31/2018, 8:54 AM

## 2018-07-31 NOTE — Discharge Summary (Addendum)
Physician Discharge Summary  [name] [name]:[id number] DOB: September 24, 1940 DOA: 07/28/2018  PCP: Caryl Bis, MD  Admit date: 07/28/2018 Discharge date: 07/31/2018  Admitted From: Home Disposition:  Home   Recommendations for Outpatient Follow-up:  1. Follow up with PCP in 1-2 weeks 2. Please obtain BMP/CBC in one week     Discharge Condition: Stable CODE STATUS: FULL Diet recommendation: Heart Healthy   Brief/Interim Summary: 78 year old gentleman with hypertension, CAD, cath Oct 2019 status post PCI with DES to circumflex, PAF status post recent electrical cardioversion 07/24/2018 in ED.  The patient discharged home after that on aspirin and apixaban.  Unfortunately he had a difficult time getting his apixaban prescription filled due to cost.  He presented to the ED with recurrent palpitations and noted to be in A. fib with RVR. He was initially started on IV diltiazem.  Cardiology was consulted to assist.    Discharge Diagnoses:  1. Paroxsymal Atrial fibrillation with RVR- recurrent episodes of paroxysmal atrial fibrillation.  He is on apixaban for full anticoagulation.  The social worker has spoken with him and he will be able to afford the cost of the apixaban.  I have spoken with him and confirmed that he will be able to afford the medication.  Cardiology has seen him.  He has been weaned off the Cardizem infusion.  He was transitioned  on oral Toprol-XL and Cardizem 60 mg q 8 hours.  His medications have been titrated by cardiology.    I really appreciate the assistance of the inpatient cardiology service.   The patient will go home with metoprolol succinate 100 mg daily and cardizem CD 180 mg daily which is the dose the patient has been receiving the last 24 hours prior to his d/c.  HR 70-80 on day of d/c Consider [place] in 3 weeks if he does not convert on his own   2. Essential hypertension- his blood pressures are controlled on Toprol-XL 75 mg daily plus diltiazem CD 180 mg daily.    Discontinue spironolactone, HCTZ, hydralazine, benazepril 3. CAD - Pt is on aspirin and plavix for antiplatelet therapy which is what patient states he has been taking prior to admission   4. Dyslipidemia - continue atorvastatin 80 mg daily.  5. Iron Deficiency Anemia - He was resumed on home ferrous sulfate daily.    Discharge Instructions   Allergies as of 07/31/2018      Reactions   Amiodarone    rash      Medication List    STOP taking these medications   benazepril 40 MG tablet Commonly known as:  LOTENSIN   hydrALAZINE 100 MG tablet Commonly known as:  APRESOLINE   hydrochlorothiazide 12.5 MG tablet Commonly known as:  HYDRODIURIL   nitroGLYCERIN 0.4 MG SL tablet Commonly known as:  NITROSTAT   spironolactone 25 MG tablet Commonly known as:  ALDACTONE     TAKE these medications   allopurinol 300 MG tablet Commonly known as:  ZYLOPRIM Take 450 mg by mouth daily.   apixaban 5 MG Tabs tablet Commonly known as:  ELIQUIS Take 1 tablet (5 mg total) by mouth 2 (two) times daily.   aspirin 81 MG tablet Take 81 mg by mouth daily.   atorvastatin 80 MG tablet Commonly known as:  LIPITOR TAKE 1 TABLET BY MOUTH ONCE DAILY AT  6PM   clopidogrel 75 MG tablet Commonly known as:  PLAVIX Take 1 tablet (75 mg total) by mouth daily.   diltiazem 180 MG 24  hr capsule Commonly known as:  CARDIZEM CD Take 1 capsule (180 mg total) by mouth daily.   ferrous sulfate 325 (65 FE) MG tablet Take 325 mg by mouth 2 (two) times daily.   metoprolol succinate 100 MG 24 hr tablet Commonly known as:  TOPROL-XL Take 1 tablet (100 mg total) by mouth daily. Take with or immediately following a meal. Start taking on:  Aug 01, 2018 What changed:    medication strength  See the new instructions.   multivitamin tablet Take 1 tablet by mouth daily.   pantoprazole 40 MG tablet Commonly known as:  PROTONIX TAKE 1 TABLET BY MOUTH ONCE DAILY What changed:  when to take this   vitamin  C 500 MG tablet Commonly known as:  ASCORBIC ACID Take 500 mg by mouth 2 (two) times daily.       Allergies  Allergen Reactions  . Amiodarone     rash    Consultations:  cardiology   Procedures/Studies: Dg Chest Portable 1 View  Result Date: 07/28/2018 CLINICAL DATA:  Tachycardia EXAM: PORTABLE CHEST 1 VIEW COMPARISON:  January 05, 2014 FINDINGS: The heart size and mediastinal contours are within normal limits. Both lungs are clear. The visualized skeletal structures are unremarkable. IMPRESSION: No active disease. Electronically Signed   By: Dorise Bullion III M.D   On: 07/28/2018 23:56        Discharge Exam: Vitals:   07/31/18 0524 07/31/18 0525  BP: 95/83 95/83  Pulse: 66 69  Resp:    Temp: 98.3 F (36.8 C) 98.3 F (36.8 C)  SpO2:  (!) 87%   Vitals:   07/30/18 2039 07/30/18 2225 07/31/18 0524 07/31/18 0525  BP:  131/67 95/83 95/83   Pulse:  98 66 69  Resp:  20    Temp:  98.4 F (36.9 C) 98.3 F (36.8 C) 98.3 F (36.8 C)  TempSrc:  Oral Oral Oral  SpO2: 96% 98%  (!) 87%  Weight:      Height:        General: Pt is alert, awake, not in acute distress Cardiovascular: IRRR, S1/S2 +, no rubs, no gallops Respiratory: CTA bilaterally, no wheezing, no rhonchi Abdominal: Soft, NT, ND, bowel sounds + Extremities: no edema, no cyanosis   The results of significant diagnostics from this hospitalization (including imaging, microbiology, ancillary and laboratory) are listed below for reference.    Significant Diagnostic Studies: Dg Chest Portable 1 View  Result Date: 07/28/2018 CLINICAL DATA:  Tachycardia EXAM: PORTABLE CHEST 1 VIEW COMPARISON:  January 05, 2014 FINDINGS: The heart size and mediastinal contours are within normal limits. Both lungs are clear. The visualized skeletal structures are unremarkable. IMPRESSION: No active disease. Electronically Signed   By: Dorise Bullion III M.D   On: 07/28/2018 23:56     Microbiology: Recent Results (from the  past 240 hour(s))  MRSA PCR Screening     Status: None   Collection Time: 07/29/18  2:20 AM  Result Value Ref Range Status   MRSA by PCR NEGATIVE NEGATIVE Final    Comment:        The GeneXpert MRSA Assay (FDA approved for NASAL specimens only), is one component of a comprehensive MRSA colonization surveillance program. It is not intended to diagnose MRSA infection nor to guide or monitor treatment for MRSA infections. Performed at [facility], [address] [place], [place] [address]      Labs: Basic Metabolic Panel: Recent Labs  Lab 07/24/18 2347 07/28/18 2328 07/29/18 0410  NA  138 140 139  K 3.7 4.0 4.0  CL 106 105 108  CO2 22 26 24   GLUCOSE 173* 191* 155*  BUN 31* 25* 25*  CREATININE 1.05 1.01 0.83  CALCIUM 9.1 9.6 9.1  MG 2.2  --   --    Liver Function Tests: Recent Labs  Lab 07/29/18 0410  AST 22  ALT 22  ALKPHOS 92  BILITOT 0.4  PROT 6.4*  ALBUMIN 3.4*   No results for input(s): LIPASE, AMYLASE in the last 168 hours. No results for input(s): AMMONIA in the last 168 hours. CBC: Recent Labs  Lab 07/24/18 2347 07/28/18 2328 07/29/18 0410  WBC 6.1 7.8 7.9  NEUTROABS 3.6 4.5  --   HGB 10.8* 11.4* 10.5*  HCT 33.1* 35.6* 32.7*  MCV 92.2 93.2 93.7  PLT 180 199 192   Cardiac Enzymes: Recent Labs  Lab 07/28/18 2328  TROPONINI <0.03   BNP: Invalid input(s): POCBNP CBG: No results for input(s): GLUCAP in the last 168 hours.  Time coordinating discharge:  36 minutes  Signed:  Orson Eva, DO [facility] Pager: [phone] 07/31/2018, 10:12 AM

## 2018-07-31 NOTE — Telephone Encounter (Signed)
Pt's wife has some questions regarding his medications-- he's taking plavix, eliquis and an aspirin and wants to confirm this is correct.

## 2018-07-31 NOTE — Progress Notes (Signed)
Patient discharged home today per MD orders. Patient vital signs WDL. IV removed and site WDL. Discharge Instructions including follow up appointments, medications, and education reviewed with patient. Patient verbalizes understanding. Patient is transported out via wheelchair.  

## 2018-07-31 NOTE — Telephone Encounter (Signed)
Dr. Johnsie Cancel saw patient in consultation recently at [facility] and questioned why he was not still on ASA and Plavix after DES in 12/2017. Actually this has already been addressed - see my notes in chart. PCI note specifically mentioned the possibility of stopping DAPT early if needed, and with need to use Eliquis now, I recommended that he stop Plavix. This was also cleared with the research team. So at this point would continue ASA and Eliquis, but not resume Plavix.

## 2018-07-31 NOTE — Plan of Care (Signed)
  Problem: Education: Goal: Knowledge of General Education information will improve Description Including pain rating scale, medication(s)/side effects and non-pharmacologic comfort measures Outcome: Adequate for Discharge   Problem: Health Behavior/Discharge Planning: Goal: Ability to manage health-related needs will improve Outcome: Adequate for Discharge   Problem: Clinical Measurements: Goal: Ability to maintain clinical measurements within normal limits will improve Outcome: Adequate for Discharge   Problem: Activity: Goal: Risk for activity intolerance will decrease Outcome: Adequate for Discharge   Problem: Nutrition: Goal: Adequate nutrition will be maintained Outcome: Adequate for Discharge   Problem: Coping: Goal: Level of anxiety will decrease Outcome: Adequate for Discharge   Problem: Elimination: Goal: Will not experience complications related to bowel motility Outcome: Adequate for Discharge   Problem: Pain Managment: Goal: General experience of comfort will improve Outcome: Adequate for Discharge   Problem: Safety: Goal: Ability to remain free from injury will improve Outcome: Adequate for Discharge   Problem: Skin Integrity: Goal: Risk for impaired skin integrity will decrease Outcome: Adequate for Discharge   Problem: Education: Goal: Knowledge of disease or condition will improve Outcome: Adequate for Discharge   Problem: Activity: Goal: Ability to tolerate increased activity will improve Outcome: Adequate for Discharge   Problem: Cardiac: Goal: Ability to achieve and maintain adequate cardiopulmonary perfusion will improve Outcome: Adequate for Discharge   Problem: Health Behavior/Discharge Planning: Goal: Ability to safely manage health-related needs after discharge will improve Outcome: Adequate for Discharge

## 2018-08-01 NOTE — Telephone Encounter (Signed)
I spoke with wife and patient, they understand to Stop plavix, and to continue ASA and Eliquis

## 2018-08-05 ENCOUNTER — Telehealth: Payer: Self-pay | Admitting: Cardiology

## 2018-08-05 MED ORDER — METOPROLOL SUCCINATE ER 50 MG PO TB24: ORAL_TABLET | ORAL | 3 refills | Status: DC

## 2018-08-05 NOTE — Telephone Encounter (Signed)
Wife will reduce dose to 75 mg daily and record heart rates and call back

## 2018-08-05 NOTE — Telephone Encounter (Signed)
Per phone call from pt's wife-- Pt was admitted last week due to Afib and came home Wednesday, he was back in rhythm on Friday but now his heart rate is in the 40's-50's since Friday.  States he feels ok, no CP

## 2018-08-05 NOTE — Telephone Encounter (Signed)
Since conversion into SR, wife says patients HR has been as low as 47, but remains in the 50's.His BP since Friday has been 140's/65   She says he feels good but wanted you to be aware of HR

## 2018-08-05 NOTE — Telephone Encounter (Signed)
Suspect that his heart rate is slower if he did convert back to sinus rhythm from atrial fibrillation.  He was started on Cardizem CD most recently and his Toprol-XL was also uptitrated.  If he still has some of the old pills, consider cutting Toprol-XL back to 75 mg daily, we might even go lower depending on what his heart rate does.

## 2018-08-07 DIAGNOSIS — D509 Iron deficiency anemia, unspecified: Secondary | ICD-10-CM | POA: Diagnosis not present

## 2018-08-07 DIAGNOSIS — R42 Dizziness and giddiness: Secondary | ICD-10-CM | POA: Diagnosis not present

## 2018-08-07 DIAGNOSIS — I4891 Unspecified atrial fibrillation: Secondary | ICD-10-CM | POA: Diagnosis not present

## 2018-08-07 DIAGNOSIS — R5383 Other fatigue: Secondary | ICD-10-CM | POA: Diagnosis not present

## 2018-08-07 DIAGNOSIS — Z6831 Body mass index (BMI) 31.0-31.9, adult: Secondary | ICD-10-CM | POA: Diagnosis not present

## 2018-08-07 DIAGNOSIS — E1122 Type 2 diabetes mellitus with diabetic chronic kidney disease: Secondary | ICD-10-CM | POA: Diagnosis not present

## 2018-08-07 DIAGNOSIS — D649 Anemia, unspecified: Secondary | ICD-10-CM | POA: Diagnosis not present

## 2018-08-07 DIAGNOSIS — K210 Gastro-esophageal reflux disease with esophagitis: Secondary | ICD-10-CM | POA: Diagnosis not present

## 2018-08-07 DIAGNOSIS — D529 Folate deficiency anemia, unspecified: Secondary | ICD-10-CM | POA: Diagnosis not present

## 2018-08-07 DIAGNOSIS — I10 Essential (primary) hypertension: Secondary | ICD-10-CM | POA: Diagnosis not present

## 2018-08-08 ENCOUNTER — Telehealth (INDEPENDENT_AMBULATORY_CARE_PROVIDER_SITE_OTHER): Payer: Medicare HMO | Admitting: Student

## 2018-08-08 ENCOUNTER — Encounter: Payer: Self-pay | Admitting: Student

## 2018-08-08 VITALS — BP 141/64 | HR 45 | Ht 72.0 in | Wt 218.0 lb

## 2018-08-08 DIAGNOSIS — I2510 Atherosclerotic heart disease of native coronary artery without angina pectoris: Secondary | ICD-10-CM | POA: Diagnosis not present

## 2018-08-08 DIAGNOSIS — Z7189 Other specified counseling: Secondary | ICD-10-CM

## 2018-08-08 DIAGNOSIS — E785 Hyperlipidemia, unspecified: Secondary | ICD-10-CM

## 2018-08-08 DIAGNOSIS — I480 Paroxysmal atrial fibrillation: Secondary | ICD-10-CM

## 2018-08-08 DIAGNOSIS — I10 Essential (primary) hypertension: Secondary | ICD-10-CM

## 2018-08-08 MED ORDER — METOPROLOL SUCCINATE ER 50 MG PO TB24: 50.0000 mg | ORAL_TABLET | Freq: Every day | ORAL | 3 refills | Status: DC

## 2018-08-08 MED ORDER — BENAZEPRIL HCL 20 MG PO TABS: 20.0000 mg | ORAL_TABLET | Freq: Every day | ORAL | 3 refills | Status: DC

## 2018-08-08 NOTE — Patient Instructions (Signed)
Medication Instructions:  Your physician has recommended you make the following change in your medication:   Decrease Toprol XL to 50 mg Daily   Start Benazepril 20 mg Daily  Labwork: NONE   Testing/Procedures: NONE  Follow-Up: Your physician recommends that you schedule a follow-up appointment in: 4-6 Weeks with Dr. Domenic Polite   Any Other Special Instructions Will Be Listed Below (If Applicable).  Your physician has requested that you regularly monitor and record your blood pressure readings at home for one week and call to the office. Please use the same machine at the same time of day to check your readings and record them.     If you need a refill on your cardiac medications before your next appointment, please call your pharmacy.  Thank you for choosing [facility]!

## 2018-08-08 NOTE — Progress Notes (Signed)
Virtual Visit via Video Note   This visit type was conducted due to national recommendations for restrictions regarding the COVID-19 Pandemic (e.g. social distancing) in an effort to limit this patient's exposure and mitigate transmission in our community.  Due to his co-morbid illnesses, this patient is at least at moderate risk for complications without adequate follow up.  This format is felt to be most appropriate for this patient at this time.  All issues noted in this document were discussed and addressed.  A limited physical exam was performed with this format.  Please refer to the patient's chart for his consent to telehealth for [facility].   Date:  08/09/2018   ID:  Cala Bradford, DOB [birth year], MRN [id number]  Patient Location: Home Provider Location: Home  PCP:  Caryl Bis, MD  Cardiologist:  Rozann Lesches, MD  Electrophysiologist:  None   Evaluation Performed:  Follow-Up Visit  Chief Complaint:  Fatigue  History of Present Illness:    [name] [name] is a 78 y.o. male with past medical history of CAD (s/p DES to LCx in 12/2017), PAF, HTN, and HLD who presents for a telehealth visit for hospital follow-up.  He had a telehealth visit with Dr. Domenic Polite on 06/26/2018 and was overall doing well at that time. Was walking 3 miles per day without any anginal symptoms. BP had been elevated and his PCP had recently initiated Spironolactone and titrated his Hydralazine to 50mg  BID. No further medication changes were made at that time.   In the interim, he presented to [facility] ED on 07/24/2018 for evaluation of palpitations and was found to atrial fibrillation with RVR. He underwent DCCV while in the ED given recent onset of symptoms and converted back to NSR. Was started on Eliquis 5mg  BID. Anticoagulation and antiplatelets were reviewed with Dr. Domenic Polite the following day and it was recommended to stop Plavix and continue ASA and Eliquis.   He presented back to the ED on  07/28/2018 with recurrent symptoms and found to be back in atrial fibrillation with RVR and was admitted for further evaluation. Was initially placed on IV Cardizem and this was continued back to a PO regimen of Toprol-XL 100mg  daily and Cardizem CD 180mg  daily with consideration of outpatient DCCV if he did not convert in the interim. His wife called the office on 5/11 reporting his HR was now in the 40's to 90's and it was recommended he reduce Toprol-XL to 75mg  daily.   In talking with the patient and his wife today, he denies any recurrent palpitations. Says he has been in NSR but HR has been in the mid-40's to 50's. Denies any dizziness or presyncope but feels fatigued. No recent chest pain, dyspnea on exertion, orthopnea, PND, or edema.   He reports good compliance with Eliquis and denies missing any recent doses. No evidence of melena, hematochezia, or hematuria. BP has started to increase, with SBP typically in the 140's to 150's over the past several days.   The patient does not have symptoms concerning for COVID-19 infection (fever, chills, cough, or new shortness of breath).    Past Medical History:  Diagnosis Date  . Anemia   . Arthritis   . CAD (coronary artery disease)    DES to circumflex 12/2017  . Gout   . Headache   . History of kidney stones   . Hypertension   . NSTEMI (non-ST elevated myocardial infarction) ([place]) 12/31/2017  . Paroxysmal atrial fibrillation (HCC)   .  Pericardial effusion    Idiopathic, recurrent pericardial effusion s/p pericardial window.  . Prostate cancer ([place]) 2007   S/P Seed implants   . Supraventricular tachycardia ([place])   . Temporal arteritis [facility])    Past Surgical History:  Procedure Laterality Date  . CATARACT EXTRACTION W/PHACO Left 06/07/2015   Procedure: CATARACT EXTRACTION PHACO AND INTRAOCULAR LENS PLACEMENT LEFT EYE CDE=8.00;  Surgeon: Tonny Branch, MD;  Location: [facility];  Service: Ophthalmology;  Laterality: Left;  . CATARACT EXTRACTION  W/PHACO Right 06/17/2015   Procedure: CATARACT EXTRACTION PHACO AND INTRAOCULAR LENS PLACEMENT RIGHT EYE CDE=11.09;  Surgeon: Tonny Branch, MD;  Location: [facility];  Service: Ophthalmology;  Laterality: Right;  . CORONARY ANGIOPLASTY WITH STENT PLACEMENT  01/01/2018  . CORONARY STENT INTERVENTION N/A 01/01/2018   Procedure: CORONARY STENT INTERVENTION;  Surgeon: Jettie Booze, MD;  Location: [facility];  Service: Cardiovascular;  Laterality: N/A;  . FRACTURE SURGERY    . INSERTION PROSTATE RADIATION SEED  2007  . KNEE ARTHROSCOPY Left   . LEFT HEART CATH AND CORONARY ANGIOGRAPHY N/A 01/01/2018   Procedure: LEFT HEART CATH AND CORONARY ANGIOGRAPHY;  Surgeon: Jettie Booze, MD;  Location: [facility];  Service: Cardiovascular;  Laterality: N/A;  . ORIF TIBIA & FIBULA FRACTURES Left 2003   Distal tibial/fibula   . PERICARDIAL WINDOW  December 2008  . PERICARDIOCENTESIS  2007   hx/notes 10/19/2011     Current Meds  Medication Sig  . allopurinol (ZYLOPRIM) 300 MG tablet Take 450 mg by mouth daily.   Marland Kitchen apixaban (ELIQUIS) 5 MG TABS tablet Take 1 tablet (5 mg total) by mouth 2 (two) times daily.  Marland Kitchen aspirin 81 MG tablet Take 81 mg by mouth daily.  Marland Kitchen atorvastatin (LIPITOR) 80 MG tablet TAKE 1 TABLET BY MOUTH ONCE DAILY AT  6PM  . diltiazem (CARDIZEM CD) 180 MG 24 hr capsule Take 1 capsule (180 mg total) by mouth daily.  . ferrous sulfate 325 (65 FE) MG tablet Take 325 mg by mouth 2 (two) times daily.  . Multiple Vitamin (MULTIVITAMIN) tablet Take 1 tablet by mouth daily.  . pantoprazole (PROTONIX) 40 MG tablet TAKE 1 TABLET BY MOUTH ONCE DAILY (Patient taking differently: Take 40 mg by mouth 2 (two) times daily. )  . vitamin C (ASCORBIC ACID) 500 MG tablet Take 500 mg by mouth 2 (two) times daily.   . [DISCONTINUED] metoprolol succinate (TOPROL-XL) 50 MG 24 hr tablet Take 75 mg daily ( 1 1/2 tablets)     Allergies:   Amiodarone   Social History   Tobacco Use  . Smoking  status: Former Smoker    Packs/day: 0.30    Years: 10.00    Pack years: 3.00    Types: Cigarettes    Start date: 08/06/1958    Last attempt to quit: 1968    Years since quitting: 52.4  . Smokeless tobacco: Never Used  Substance Use Topics  . Alcohol use: Not Currently  . Drug use: Never     Family Hx: The patient's family history includes CAD in his mother; Lung cancer in his brother; Prostate cancer in his brother and father.  ROS:   Please see the history of present illness.     All other systems reviewed and are negative.   Prior CV studies:   The following studies were reviewed today:  Cardiac Catheterization: 12/2017  Mid Cx lesion is 95% stenosed.  Prox LAD lesion is 25% stenosed.  Mid LAD lesion is  30% stenosed.  Prox RCA to Mid RCA lesion is 50% stenosed.  The left ventricular systolic function is normal.  LV end diastolic pressure is normal. LVEDP 12 mm Hg.  The left ventricular ejection fraction is 50-55% by visual estimate.  There is no aortic valve stenosis.  Severe tortuosity in the right subclavian which makes torquing catheters difficult. Would consider femoral approach in the future, particularly if emergent cardiac cath was needed.  A drug-eluting stent was successfully placed using a STENT SIERRA 3.25 X 15 MM.  Post intervention, there is a 0% residual stenosis.  Recommend uninterrupted dual antiplatelet therapy with Aspirin 81mg  daily and Ticagrelor 90mg  twice daily for a minimum of 12 months (ACS - Class I recommendation).   Continue aggressive secondary prevention.  IV Cangrelor for 2 hours.  Cangrelor was used in the event there was any bleeding issue in the forearm, it could be turned off and would wear off quickly.  Addendum: The patient is a candidate for the [employer] short DAPT study due to his age.  If he chooses to participate in the study and his bleeding risk is considered higher than his ischemic risk, I think it would be  reasonable to shorten his DAPT duration to 30 days.    Echocardiogram: 12/2017 Study Conclusions  - Left ventricle: The cavity size was normal. There was severe   focal basal hypertrophy. Systolic function was normal. The   estimated ejection fraction was in the range of 55% to 60%. Wall   motion was normal; there were no regional wall motion   abnormalities. There was an increased relative contribution of   atrial contraction to ventricular filling. Doppler parameters are   consistent with abnormal left ventricular relaxation (grade 1   diastolic dysfunction). - Mitral valve: Calcified annulus. Valve area by pressure   half-time: 1.59 cm^2. - Left atrium: The atrium was mildly dilated. - Pulmonic valve: There was trivial regurgitation. - Pulmonary arteries: Systolic pressure could not be accurately   estimated. - Pericardium, extracardiac: A trivial pericardial effusion was   identified posterior to the heart.  Labs/Other Tests and Data Reviewed:    EKG:  An ECG dated 07/28/2018 was personally reviewed today and demonstrated:  atrial fibrillation with RVR, HR 156.  Recent Labs: 12/31/2017: B Natriuretic Peptide 210.0; TSH 5.639 07/24/2018: Magnesium 2.2 07/29/2018: ALT 22; BUN 25; Creatinine, Ser 0.83; Hemoglobin 10.5; Platelets 192; Potassium 4.0; Sodium 139   Recent Lipid Panel Lab Results  Component Value Date/Time   CHOL 142 01/01/2018 06:26 AM   TRIG 73 01/01/2018 06:26 AM   HDL 34 (L) 01/01/2018 06:26 AM   CHOLHDL 4.2 01/01/2018 06:26 AM   LDLCALC 93 01/01/2018 06:26 AM    Wt Readings from Last 3 Encounters:  08/08/18 218 lb (98.9 kg)  07/30/18 212 lb 15.4 oz (96.6 kg)  07/24/18 215 lb (97.5 kg)     Objective:    Vital Signs:  BP (!) 141/64   Pulse (!) 45   Ht 6' (1.829 m)   Wt 218 lb (98.9 kg) Comment: per pt  BMI 29.57 kg/m    General: Pleasant elderly Caucasian male appearing in NAD Psych: Normal affect. Neuro: Alert and oriented X 3. Moves all  extremities spontaneously. HEENT: Normal in appearance.   Lungs:  Resp regular and unlabored by video assessment.   ASSESSMENT & PLAN:    1. CAD - s/p DES to LCx in 12/2017. He denies any recent chest pain and his dyspnea has now resolved since  returning back to NSR.  - continue ASA, statin, and BB therapy. No longer on Plavix given the need for anticoagulation as previously discussed in prior notes.   2. Paroxysmal Atrial Fibrillation - given significant improvement in his rates and resolution of palpitations, it is likely he has converted back to NSR since recent admission. He is currently on Toprol-XL 75mg  daily and Cardizem CD 180mg  daily with HR in the mid-40's to 50's. Will further reduce Toprol-XL to 50mg  daily which may need to be further adjusted pending HR response.  - he denies any evidence of active bleeding. Continue Eliquis 5mg  BID for anticoagulation.   3. HTN - BP has started to become elevated with dose reduction of Toprol-XL. He was previously on Benazepril 40mg  daily, Hydralazine 100mg  BID, HCTZ 12.5mg  daily, and Spironolactone 25mg  BID but these were discontinued during his admission earlier this month when he was started on Cardizem CD and Toprol-XL was titrated. Given further reduction of Toprol-XL due to bradycardia, will restart Benazepril at a lower dose of 20mg  daily which can be further titrated in 2 weeks if BP remains elevated. He has upcoming labs with his PCP but if BMET is not obtained within 2-3 weeks, would draw before further titration of ACE-I. Continue Cardizem CD.   4. HLD - followed by PCP. LDL at 42 when checked in 03/2018. Continue Atorvastatin 80mg  daily.   5. COVID-19 Education: - The signs and symptoms of COVID-19 were discussed with the patient. The importance of social distancing was discussed today.  Time:   Today, I have spent 26 minutes with the patient with telehealth technology discussing the above problems.     Medication Adjustments/Labs  and Tests Ordered: Current medicines are reviewed at length with the patient today.  Concerns regarding medicines are outlined above.   Tests Ordered: No orders of the defined types were placed in this encounter.   Medication Changes: Meds ordered this encounter  Medications  . metoprolol succinate (TOPROL-XL) 50 MG 24 hr tablet    Sig: Take 1 tablet (50 mg total) by mouth daily. Take with or immediately following a meal.    Dispense:  90 tablet    Refill:  3  . benazepril (LOTENSIN) 20 MG tablet    Sig: Take 1 tablet (20 mg total) by mouth daily.    Dispense:  90 tablet    Refill:  3    Disposition:  Follow up with Dr. Domenic Polite in 4-6 weeks.   Signed, Erma Heritage, PA-C  08/09/2018 9:01 AM    [place]

## 2018-08-15 ENCOUNTER — Telehealth: Payer: Self-pay | Admitting: Cardiology

## 2018-08-15 NOTE — Telephone Encounter (Signed)
5/15- 136/862 HR 46 5/16- 151/67 HR 47 5/17- 150/65 HR 51 5/18- 147/64 HR 53 5/19- 171/73 HR 50 5/20- 179/72 HR 52 5/21- 164/65 HR 10  PT's wife stated [place], [place] asked them to call this week with an update on BP - numbers listed above.

## 2018-08-15 NOTE — Telephone Encounter (Signed)
Pt's wife called to give BP readings for the week

## 2018-08-20 MED ORDER — BENAZEPRIL HCL 40 MG PO TABS: 40.0000 mg | ORAL_TABLET | Freq: Every day | ORAL | 3 refills | Status: DC

## 2018-08-20 NOTE — Telephone Encounter (Signed)
Spoke with wife who states lab was completed by PCP. Will request results. Wife informed of medication changes. Wife voiced understanding.

## 2018-08-20 NOTE — Telephone Encounter (Signed)
    I apologize for the delay as I was out last week and it does not look like this was addressed by the covering provider. HR remains low-normal but is improving. Would recommend increasing Benazepril to his previous dose of 40mg  daily. He needs a repeat BMET within the next 1-2 weeks if this has not yet been obtained by his PCP. Continue to follow HR and BP as we may need to further reduce his Toprol-XL or Cardizem CD in the future.   Signed, Erma Heritage, PA-C 08/20/2018, 8:08 AM Pager: [phone]

## 2018-08-24 DIAGNOSIS — E1165 Type 2 diabetes mellitus with hyperglycemia: Secondary | ICD-10-CM | POA: Diagnosis not present

## 2018-08-24 DIAGNOSIS — I10 Essential (primary) hypertension: Secondary | ICD-10-CM | POA: Diagnosis not present

## 2018-08-30 DIAGNOSIS — E1122 Type 2 diabetes mellitus with diabetic chronic kidney disease: Secondary | ICD-10-CM | POA: Diagnosis not present

## 2018-08-30 DIAGNOSIS — I4891 Unspecified atrial fibrillation: Secondary | ICD-10-CM | POA: Diagnosis not present

## 2018-08-30 DIAGNOSIS — D509 Iron deficiency anemia, unspecified: Secondary | ICD-10-CM | POA: Diagnosis not present

## 2018-09-19 ENCOUNTER — Encounter: Payer: Self-pay | Admitting: Student

## 2018-09-19 ENCOUNTER — Other Ambulatory Visit: Payer: Self-pay

## 2018-09-19 ENCOUNTER — Ambulatory Visit: Payer: Medicare HMO | Admitting: Student

## 2018-09-19 VITALS — BP 166/70 | HR 68 | Temp 98.3°F | Ht 72.0 in | Wt 221.0 lb

## 2018-09-19 DIAGNOSIS — I480 Paroxysmal atrial fibrillation: Secondary | ICD-10-CM | POA: Diagnosis not present

## 2018-09-19 DIAGNOSIS — E785 Hyperlipidemia, unspecified: Secondary | ICD-10-CM

## 2018-09-19 DIAGNOSIS — I10 Essential (primary) hypertension: Secondary | ICD-10-CM

## 2018-09-19 DIAGNOSIS — I2510 Atherosclerotic heart disease of native coronary artery without angina pectoris: Secondary | ICD-10-CM

## 2018-09-19 DIAGNOSIS — D519 Vitamin B12 deficiency anemia, unspecified: Secondary | ICD-10-CM | POA: Diagnosis not present

## 2018-09-19 DIAGNOSIS — D649 Anemia, unspecified: Secondary | ICD-10-CM | POA: Diagnosis not present

## 2018-09-19 MED ORDER — HYDRALAZINE HCL 50 MG PO TABS: 50.0000 mg | ORAL_TABLET | Freq: Two times a day (BID) | ORAL | 3 refills | Status: DC

## 2018-09-19 NOTE — Progress Notes (Signed)
Cardiology Office Note    Date:  09/19/2018   ID:  [name] [name], DOB 11/04/1940, MRN [id number]  PCP:  Caryl Bis, MD  Cardiologist: Rozann Lesches, MD    Chief Complaint  Patient presents with  . Follow-up    6 week visit    History of Present Illness:    [name] [name] is a 78 y.o. male with past medical history of CAD (s/p DES to LCx in 12/2017), PAF (s/p DCCV in 06/2018 with recurrent atrial fibrillation in 07/2018), HTN, and HLD who presents to the office today for 6 week follow-up.   He most recently had a follow-up telehealth visit with myself on 08/08/2018 following his recent hospitalization for atrial fibrillation with RVR. He had required IV Cardizem during admission and was continued on Toprol-XL with plans for an outpatient DCCV if he remained in atrial fibrillation.  At the time of his visit, he reported overall doing well and his heart rate had been in the 40's to 50's and he felt like he was back in normal sinus rhythm. He was taking Toprol-XL 75 mg daily and Cardizem CD 180 mg daily and given his bradycardia, Toprol-XL was reduced to 50 mg daily.  Was restarted on Benzapril 20 mg daily given his elevated BP.  He called on 08/15/2018 and heart rate was still in the 50's but BP was elevated, therefore Benzapril was further titrated to 40 mg daily.  In talking with the patient today, he reports overall doing well since his last telehealth visit. He denies any recent palpitations. Has been walking 2 to 3 miles with his wife in the mornings along with performing routine yard work without any recent chest pain or dyspnea on exertion. Denies any recent orthopnea, PND, or lower extremity edema.  His blood pressure continues to be variable at home and he brings with him today a detailed blood pressure log and systolic readings have averaged in the 140's to 170's. HR has been variable in the high-40's to 60's. He denies any recent lightheadedness, dizziness, or presyncope.    Past Medical History:  Diagnosis Date  . Anemia   . Arthritis   . CAD (coronary artery disease)    DES to circumflex 12/2017  . Gout   . Headache   . History of kidney stones   . Hypertension   . NSTEMI (non-ST elevated myocardial infarction) ([place]) 12/31/2017  . Paroxysmal atrial fibrillation (HCC)   . Pericardial effusion    Idiopathic, recurrent pericardial effusion s/p pericardial window.  . Prostate cancer ([place]) 2007   S/P Seed implants   . Supraventricular tachycardia ([place])   . Temporal arteritis [facility])     Past Surgical History:  Procedure Laterality Date  . CATARACT EXTRACTION W/PHACO Left 06/07/2015   Procedure: CATARACT EXTRACTION PHACO AND INTRAOCULAR LENS PLACEMENT LEFT EYE CDE=8.00;  Surgeon: Tonny Branch, MD;  Location: [facility];  Service: Ophthalmology;  Laterality: Left;  . CATARACT EXTRACTION W/PHACO Right 06/17/2015   Procedure: CATARACT EXTRACTION PHACO AND INTRAOCULAR LENS PLACEMENT RIGHT EYE CDE=11.09;  Surgeon: Tonny Branch, MD;  Location: [facility];  Service: Ophthalmology;  Laterality: Right;  . CORONARY ANGIOPLASTY WITH STENT PLACEMENT  01/01/2018  . CORONARY STENT INTERVENTION N/A 01/01/2018   Procedure: CORONARY STENT INTERVENTION;  Surgeon: Jettie Booze, MD;  Location: [place] [facility];  Service: Cardiovascular;  Laterality: N/A;  . FRACTURE SURGERY    . INSERTION PROSTATE RADIATION SEED  2007  . KNEE ARTHROSCOPY Left   .  LEFT HEART CATH AND CORONARY ANGIOGRAPHY N/A 01/01/2018   Procedure: LEFT HEART CATH AND CORONARY ANGIOGRAPHY;  Surgeon: Jettie Booze, MD;  Location: [place] [facility];  Service: Cardiovascular;  Laterality: N/A;  . ORIF TIBIA & FIBULA FRACTURES Left 2003   Distal tibial/fibula   . PERICARDIAL WINDOW  December 2008  . PERICARDIOCENTESIS  2007   hx/notes 10/19/2011    Current Medications: Outpatient Medications Prior to Visit  Medication Sig Dispense Refill  . allopurinol (ZYLOPRIM) 300 MG tablet Take 450 mg by mouth daily.      Marland Kitchen apixaban (ELIQUIS) 5 MG TABS tablet Take 1 tablet (5 mg total) by mouth 2 (two) times daily. 60 tablet 0  . aspirin 81 MG tablet Take 81 mg by mouth daily.    Marland Kitchen atorvastatin (LIPITOR) 80 MG tablet TAKE 1 TABLET BY MOUTH ONCE DAILY AT  6PM 90 tablet 1  . benazepril (LOTENSIN) 40 MG tablet Take 1 tablet (40 mg total) by mouth daily. 90 tablet 3  . diltiazem (CARDIZEM CD) 180 MG 24 hr capsule Take 1 capsule (180 mg total) by mouth daily. 30 capsule 1  . ferrous sulfate 325 (65 FE) MG tablet Take 325 mg by mouth 2 (two) times daily.    . metoprolol succinate (TOPROL-XL) 50 MG 24 hr tablet Take 1 tablet (50 mg total) by mouth daily. Take with or immediately following a meal. 90 tablet 3  . Multiple Vitamin (MULTIVITAMIN) tablet Take 1 tablet by mouth daily.    . pantoprazole (PROTONIX) 40 MG tablet TAKE 1 TABLET BY MOUTH ONCE DAILY (Patient taking differently: Take 40 mg by mouth 2 (two) times daily. ) 90 tablet 3  . vitamin C (ASCORBIC ACID) 500 MG tablet Take 500 mg by mouth 2 (two) times daily.      No facility-administered medications prior to visit.      Allergies:   Amiodarone   Social History   Socioeconomic History  . Marital status: Married    Spouse name: Not on file  . Number of children: Not on file  . Years of education: Not on file  . Highest education level: Not on file  Occupational History  . Not on file  Social Needs  . Financial resource strain: Not on file  . Food insecurity    Worry: Not on file    Inability: Not on file  . Transportation needs    Medical: Not on file    Non-medical: Not on file  Tobacco Use  . Smoking status: Former Smoker    Packs/day: 0.30    Years: 10.00    Pack years: 3.00    Types: Cigarettes    Start date: 08/06/1958    Quit date: 1968    Years since quitting: 52.5  . Smokeless tobacco: Never Used  Substance and Sexual Activity  . Alcohol use: Not Currently  . Drug use: Never  . Sexual activity: Not on file  Lifestyle  .  Physical activity    Days per week: Not on file    Minutes per session: Not on file  . Stress: Not on file  Relationships  . Social Herbalist on phone: Not on file    Gets together: Not on file    Attends religious service: Not on file    Active member of club or organization: Not on file    Attends meetings of clubs or organizations: Not on file    Relationship status: Not on file  Other Topics Concern  . Not on file  Social History Narrative  . Not on file     Family History:  The patient's family history includes CAD in his mother; Lung cancer in his brother; Prostate cancer in his brother and father.   Review of Systems:   Please see the history of present illness.     General:  No chills, fever, night sweats or weight changes.  Cardiovascular:  No chest pain, dyspnea on exertion, edema, orthopnea, palpitations, paroxysmal nocturnal dyspnea. Dermatological: No rash, lesions/masses Respiratory: No cough, dyspnea Urologic: No hematuria, dysuria Abdominal:   No nausea, vomiting, diarrhea, bright red blood per rectum, melena, or hematemesis Neurologic:  No visual changes, wkns, changes in mental status.  He denies any of the above symptoms.   All other systems reviewed and are otherwise negative except as noted above.   Physical Exam:    VS:  BP (!) 166/70   Pulse 68   Temp 98.3 F (36.8 C) (Temporal)   Ht 6' (1.829 m)   Wt 221 lb (100.2 kg)   SpO2 98%   BMI 29.97 kg/m    General: Well developed, well nourished Caucasian male appearing in no acute distress. Head: Normocephalic, atraumatic, sclera non-icteric, no xanthomas, nares are without discharge.  Neck: No carotid bruits. JVD not elevated.  Lungs: Respirations regular and unlabored, without wheezes or rales.  Heart: Regular rate and rhythm. No S3 or S4.  No murmur, no rubs, or gallops appreciated. Abdomen: Soft, non-tender, non-distended with normoactive bowel sounds. No hepatomegaly. No  rebound/guarding. No obvious abdominal masses. Msk:  Strength and tone appear normal for age. No joint deformities or effusions. Extremities: No clubbing or cyanosis. No lower extremity edema.  Distal pedal pulses are 2+ bilaterally. Neuro: Alert and oriented X 3. Moves all extremities spontaneously. No focal deficits noted. Psych:  Responds to questions appropriately with a normal affect. Skin: No rashes or lesions noted  Wt Readings from Last 3 Encounters:  09/19/18 221 lb (100.2 kg)  08/08/18 218 lb (98.9 kg)  07/30/18 212 lb 15.4 oz (96.6 kg)     Studies/Labs Reviewed:   EKG:  EKG is ordered today.  The ekg ordered today demonstrates NSR, HR 61, with baseline artifact but no acute ST changes when compared to prior tracings.   Recent Labs: 12/31/2017: B Natriuretic Peptide 210.0; TSH 5.639 07/24/2018: Magnesium 2.2 07/29/2018: ALT 22; BUN 25; Creatinine, Ser 0.83; Hemoglobin 10.5; Platelets 192; Potassium 4.0; Sodium 139   Lipid Panel    Component Value Date/Time   CHOL 142 01/01/2018 0626   TRIG 73 01/01/2018 0626   HDL 34 (L) 01/01/2018 0626   CHOLHDL 4.2 01/01/2018 0626   VLDL 15 01/01/2018 0626   LDLCALC 93 01/01/2018 0626    Additional studies/ records that were reviewed today include:   Cardiac Catheterization: 12/2017  Mid Cx lesion is 95% stenosed.  Prox LAD lesion is 25% stenosed.  Mid LAD lesion is 30% stenosed.  Prox RCA to Mid RCA lesion is 50% stenosed.  The left ventricular systolic function is normal.  LV end diastolic pressure is normal. LVEDP 12 mm Hg.  The left ventricular ejection fraction is 50-55% by visual estimate.  There is no aortic valve stenosis.  Severe tortuosity in the right subclavian which makes torquing catheters difficult. Would consider femoral approach in the future, particularly if emergent cardiac cath was needed.  A drug-eluting stent was successfully placed using a STENT SIERRA 3.25 X 15 MM.  Post intervention,  there is a  0% residual stenosis.    Recommend uninterrupted dual antiplatelet therapy with Aspirin 81mg  daily and Ticagrelor 90mg  twice daily for a minimum of 12 months (ACS - Class I recommendation).   Continue aggressive secondary prevention.  IV Cangrelor for 2 hours.  Cangrelor was used in the event there was any bleeding issue in the forearm, it could be turned off and would wear off quickly.  Addendum: The patient is a candidate for the [employer] short DAPT study due to his age.  If he chooses to participate in the study and his bleeding risk is considered higher than his ischemic risk, I think it would be reasonable to shorten his DAPT duration to 30 days.     Echocardiogram: 12/2017 Study Conclusions  - Left ventricle: The cavity size was normal. There was severe   focal basal hypertrophy. Systolic function was normal. The   estimated ejection fraction was in the range of 55% to 60%. Wall   motion was normal; there were no regional wall motion   abnormalities. There was an increased relative contribution of   atrial contraction to ventricular filling. Doppler parameters are   consistent with abnormal left ventricular relaxation (grade 1   diastolic dysfunction). - Mitral valve: Calcified annulus. Valve area by pressure   half-time: 1.59 cm^2. - Left atrium: The atrium was mildly dilated. - Pulmonic valve: There was trivial regurgitation. - Pulmonary arteries: Systolic pressure could not be accurately   estimated. - Pericardium, extracardiac: A trivial pericardial effusion was   identified posterior to the heart.  Assessment:    1. Paroxysmal atrial fibrillation (HCC)   2. Coronary artery disease involving native coronary artery of native heart without angina pectoris   3. Essential hypertension   4. Hyperlipidemia LDL goal <70      Plan:   In order of problems listed above:  1. Paroxysmal Atrial Fibrillation - He underwent cardioversion in 06/2018 but had a recurrent  episode in 07/2018 with spontaneous conversion back to normal sinus rhythm following discharge. He is maintaining normal sinus rhythm by examination and EKG today. Currently on Cardizem CD 180 mg daily and Toprol-XL 50 mg daily (previously on 100mg  daily but we have been gradually reducing the dose secondary to bradycardia).  Would anticipate he will require further dose reduction of Toprol-XL to 25 mg daily or cessation of Cardizem CD. Did not adjust today as HR was in the 60's and we need to restart his prior anti-hypertensive medications given his elevated BP.  - He denies any evidence of active bleeding. Remains on Eliquis 5 mg twice daily for anticoagulation.  2. CAD - s/p DES to LCx in 12/2017. He denies any recent chest pain or dyspnea on exertion. Continue ASA, beta-blocker, and statin therapy. No longer on Plavix given the need for anticoagulation.  3. HTN - BP has been elevated when checked at home as outlined above. Elevated at 166/70 during today's visit. He is currently on Benzapril 40 mg daily, Diltiazem CD 180 mg daily, and Toprol-XL 50 mg daily. Prior to his admission earlier this year he had also been on Hydralazine 100 mg twice daily, HCTZ 12.5 mg daily and Spironolactone 25 mg twice daily but these were discontinued during his admission to allow for titration of AV nodal blocking agents. He is very hesitant about restarting medications but I reviewed with him the importance of getting his blood pressure under good control. Will plan to restart Hydralazine at a lower dose of 50 mg twice  daily and have him follow BP at home. Pending readings, would titrate to his prior dosing of 100 mg twice daily.  4. HLD - Followed by PCP. FLP in 03/2018 showed total cholesterol 94, triglycerides 54, HDL 41, and LDL 42.  He remains on atorvastatin 80 mg daily.  Medication Adjustments/Labs and Tests Ordered: Current medicines are reviewed at length with the patient today.  Concerns regarding medicines  are outlined above.  Medication changes, Labs and Tests ordered today are listed in the Patient Instructions below. Patient Instructions  Medication Instructions: Re-start Hydralazine 50 mg twice a day  All other medications stay the same  Labwork:  Procedures/Testing: none  Follow-Up: 2 months with Dr.McDowell in the [place] office  Any Additional Special Instructions Will Be Listed Below (If Applicable).   If you need a refill on your cardiac medications before your next appointment, please call your pharmacy.   Thank you for choosing [facility] !        Signed, Erma Heritage, PA-C  09/19/2018 5:14 PM    [facility] [address] [place], [place] [address] Phone: [phone] Fax: [phone]

## 2018-09-19 NOTE — Patient Instructions (Signed)
Medication Instructions: Re-start Hydralazine 50 mg twice a day  All other medications stay the same  Labwork:   Procedures/Testing: none  Follow-Up: 2 months with Dr.McDowell in the [place] office  Any Additional Special Instructions Will Be Listed Below (If Applicable).     If you need a refill on your cardiac medications before your next appointment, please call your pharmacy.      Thank you for choosing [facility] !

## 2018-09-24 DIAGNOSIS — E782 Mixed hyperlipidemia: Secondary | ICD-10-CM | POA: Diagnosis not present

## 2018-09-24 DIAGNOSIS — I10 Essential (primary) hypertension: Secondary | ICD-10-CM | POA: Diagnosis not present

## 2018-09-24 DIAGNOSIS — I482 Chronic atrial fibrillation, unspecified: Secondary | ICD-10-CM | POA: Diagnosis not present

## 2018-10-25 DIAGNOSIS — I10 Essential (primary) hypertension: Secondary | ICD-10-CM | POA: Diagnosis not present

## 2018-10-25 DIAGNOSIS — E785 Hyperlipidemia, unspecified: Secondary | ICD-10-CM | POA: Diagnosis not present

## 2018-11-07 DIAGNOSIS — E1169 Type 2 diabetes mellitus with other specified complication: Secondary | ICD-10-CM | POA: Diagnosis not present

## 2018-11-07 DIAGNOSIS — E782 Mixed hyperlipidemia: Secondary | ICD-10-CM | POA: Diagnosis not present

## 2018-11-07 DIAGNOSIS — D509 Iron deficiency anemia, unspecified: Secondary | ICD-10-CM | POA: Diagnosis not present

## 2018-11-07 DIAGNOSIS — D692 Other nonthrombocytopenic purpura: Secondary | ICD-10-CM | POA: Diagnosis not present

## 2018-11-07 DIAGNOSIS — J301 Allergic rhinitis due to pollen: Secondary | ICD-10-CM | POA: Diagnosis not present

## 2018-11-07 DIAGNOSIS — Z6831 Body mass index (BMI) 31.0-31.9, adult: Secondary | ICD-10-CM | POA: Diagnosis not present

## 2018-11-07 DIAGNOSIS — G2581 Restless legs syndrome: Secondary | ICD-10-CM | POA: Diagnosis not present

## 2018-11-07 DIAGNOSIS — D649 Anemia, unspecified: Secondary | ICD-10-CM | POA: Diagnosis not present

## 2018-11-07 DIAGNOSIS — I10 Essential (primary) hypertension: Secondary | ICD-10-CM | POA: Diagnosis not present

## 2018-11-07 DIAGNOSIS — R42 Dizziness and giddiness: Secondary | ICD-10-CM | POA: Diagnosis not present

## 2018-11-07 DIAGNOSIS — Z0001 Encounter for general adult medical examination with abnormal findings: Secondary | ICD-10-CM | POA: Diagnosis not present

## 2018-11-07 DIAGNOSIS — Z23 Encounter for immunization: Secondary | ICD-10-CM | POA: Diagnosis not present

## 2018-11-07 DIAGNOSIS — M1000 Idiopathic gout, unspecified site: Secondary | ICD-10-CM | POA: Diagnosis not present

## 2018-11-07 DIAGNOSIS — Z1212 Encounter for screening for malignant neoplasm of rectum: Secondary | ICD-10-CM | POA: Diagnosis not present

## 2018-11-07 DIAGNOSIS — D519 Vitamin B12 deficiency anemia, unspecified: Secondary | ICD-10-CM | POA: Diagnosis not present

## 2018-11-07 DIAGNOSIS — D529 Folate deficiency anemia, unspecified: Secondary | ICD-10-CM | POA: Diagnosis not present

## 2018-12-10 DIAGNOSIS — Z006 Encounter for examination for normal comparison and control in clinical research program: Secondary | ICD-10-CM

## 2018-12-10 NOTE — Research (Addendum)
XIENCE 28 Research Study 12 month follow up  Pt doing well at this time, he was placed on eliquis in May and has had no complications.   Current Outpatient Medications:  .  allopurinol (ZYLOPRIM) 300 MG tablet, Take 450 mg by mouth daily. , Disp: , Rfl:  .  apixaban (ELIQUIS) 5 MG TABS tablet, Take 1 tablet (5 mg total) by mouth 2 (two) times daily., Disp: 60 tablet, Rfl: 0 .  aspirin 81 MG tablet, Take 81 mg by mouth daily., Disp: , Rfl:  .  atorvastatin (LIPITOR) 80 MG tablet, TAKE 1 TABLET BY MOUTH ONCE DAILY AT  6PM, Disp: 90 tablet, Rfl: 1 .  benazepril (LOTENSIN) 40 MG tablet, Take 1 tablet (40 mg total) by mouth daily., Disp: 90 tablet, Rfl: 3 .  diltiazem (CARDIZEM CD) 180 MG 24 hr capsule, Take 1 capsule (180 mg total) by mouth daily., Disp: 30 capsule, Rfl: 1 .  ferrous sulfate 325 (65 FE) MG tablet, Take 325 mg by mouth 2 (two) times daily., Disp: , Rfl:  .  hydrALAZINE (APRESOLINE) 50 MG tablet, Take 1 tablet (50 mg total) by mouth 2 (two) times a day., Disp: 180 tablet, Rfl: 3 .  metoprolol succinate (TOPROL-XL) 50 MG 24 hr tablet, Take 1 tablet (50 mg total) by mouth daily. Take with or immediately following a meal., Disp: 90 tablet, Rfl: 3 .  Multiple Vitamin (MULTIVITAMIN) tablet, Take 1 tablet by mouth daily., Disp: , Rfl:  .  pantoprazole (PROTONIX) 40 MG tablet, TAKE 1 TABLET BY MOUTH ONCE DAILY (Patient taking differently: Take 40 mg by mouth 2 (two) times daily. ), Disp: 90 tablet, Rfl: 3 .  vitamin C (ASCORBIC ACID) 500 MG tablet, Take 500 mg by mouth 2 (two) times daily. , Disp: , Rfl:   Contact Type:  []  Office/hospital [x]  Phone  Subject compliant with DAPT regimen per protocol requirement?  [x]  Yes  []  No  Any changes in antiplatelet medication? []  Yes  [x]  No  Any new or changed adverse events? []  Yes  [x]  No  Any new or changed anticoagulant medication? [x]  Yes  []  No     Only to be used at 1 month follow up visit     Subject is free from Myocardial  Infarction, repeat coronary revascularization, stroke, or stent thrombosis within the 1 month after stenting? []  Yes  []  No  Subject is compliant with 1 month DAPT without interruption of either ASA and/or P2Y12 receptor inhibitor for >7 consecutive days []  Yes  []  No  Investigator is comfortable with stopping P2Y12 inhibitor after 54-month visit and subject has been instructed to stop P2Y12? []  Yes  []  No  If questions 3, 4, and 5 are all Yes - Subject is 1 month clear? []  Yes  []  No

## 2018-12-16 DIAGNOSIS — C61 Malignant neoplasm of prostate: Secondary | ICD-10-CM | POA: Diagnosis not present

## 2018-12-16 DIAGNOSIS — Z8546 Personal history of malignant neoplasm of prostate: Secondary | ICD-10-CM | POA: Diagnosis not present

## 2018-12-17 ENCOUNTER — Other Ambulatory Visit: Payer: Self-pay | Admitting: Cardiology

## 2018-12-17 MED ORDER — BENAZEPRIL HCL 40 MG PO TABS: 40.0000 mg | ORAL_TABLET | Freq: Every day | ORAL | 0 refills | Status: DC

## 2018-12-17 NOTE — Telephone Encounter (Signed)
Medication sent to pharmacy  

## 2018-12-17 NOTE — Telephone Encounter (Signed)
°*  STAT* If patient is at the pharmacy, call can be transferred to refill team.   1. Which medications need to be refilled? (please list name of each medication and dose if known) benazepril (LOTENSIN) 40 MG tablet    2. Which pharmacy/location (including street and city if local pharmacy) is medication to be sent to? [facility] in [place]   3. Do they need a 30 day or 90 day supply?    Was told that they did not have a RX for 20mg  please call patient also

## 2018-12-23 DIAGNOSIS — D529 Folate deficiency anemia, unspecified: Secondary | ICD-10-CM | POA: Diagnosis not present

## 2018-12-23 DIAGNOSIS — D519 Vitamin B12 deficiency anemia, unspecified: Secondary | ICD-10-CM | POA: Diagnosis not present

## 2018-12-23 DIAGNOSIS — R42 Dizziness and giddiness: Secondary | ICD-10-CM | POA: Diagnosis not present

## 2018-12-25 DIAGNOSIS — E1165 Type 2 diabetes mellitus with hyperglycemia: Secondary | ICD-10-CM | POA: Diagnosis not present

## 2018-12-25 DIAGNOSIS — I10 Essential (primary) hypertension: Secondary | ICD-10-CM | POA: Diagnosis not present

## 2018-12-25 DIAGNOSIS — E782 Mixed hyperlipidemia: Secondary | ICD-10-CM | POA: Diagnosis not present

## 2019-01-03 DIAGNOSIS — R69 Illness, unspecified: Secondary | ICD-10-CM | POA: Diagnosis not present

## 2019-01-08 ENCOUNTER — Other Ambulatory Visit: Payer: Self-pay | Admitting: Cardiology

## 2019-01-10 ENCOUNTER — Telehealth: Payer: Self-pay | Admitting: Cardiology

## 2019-01-10 NOTE — Telephone Encounter (Signed)
Blood pressure has been running on the high side.  Dr [name] wants him to start  taking 100mg  x2 Hydralazine - instead of 50mg 

## 2019-01-10 NOTE — Telephone Encounter (Signed)
Pt wanted to make Dr Domenic Polite aware that Dr Olena Heckle increase hydralazine to 100 mg bid for increased BP - pt doesn't know what BP has been running - updated mediation list and will forward as [facility]

## 2019-01-16 DIAGNOSIS — R69 Illness, unspecified: Secondary | ICD-10-CM | POA: Diagnosis not present

## 2019-01-24 DIAGNOSIS — I10 Essential (primary) hypertension: Secondary | ICD-10-CM | POA: Diagnosis not present

## 2019-01-24 DIAGNOSIS — E782 Mixed hyperlipidemia: Secondary | ICD-10-CM | POA: Diagnosis not present

## 2019-02-03 DIAGNOSIS — S134XXA Sprain of ligaments of cervical spine, initial encounter: Secondary | ICD-10-CM | POA: Diagnosis not present

## 2019-02-03 DIAGNOSIS — S338XXA Sprain of other parts of lumbar spine and pelvis, initial encounter: Secondary | ICD-10-CM | POA: Diagnosis not present

## 2019-02-03 DIAGNOSIS — M9901 Segmental and somatic dysfunction of cervical region: Secondary | ICD-10-CM | POA: Diagnosis not present

## 2019-02-03 DIAGNOSIS — M9903 Segmental and somatic dysfunction of lumbar region: Secondary | ICD-10-CM | POA: Diagnosis not present

## 2019-02-03 DIAGNOSIS — S233XXA Sprain of ligaments of thoracic spine, initial encounter: Secondary | ICD-10-CM | POA: Diagnosis not present

## 2019-02-03 DIAGNOSIS — M9902 Segmental and somatic dysfunction of thoracic region: Secondary | ICD-10-CM | POA: Diagnosis not present

## 2019-02-04 ENCOUNTER — Other Ambulatory Visit: Payer: Self-pay

## 2019-02-04 ENCOUNTER — Ambulatory Visit (INDEPENDENT_AMBULATORY_CARE_PROVIDER_SITE_OTHER): Payer: Medicare HMO | Admitting: Cardiology

## 2019-02-04 ENCOUNTER — Encounter: Payer: Self-pay | Admitting: Cardiology

## 2019-02-04 VITALS — BP 124/58 | HR 57 | Ht 72.0 in | Wt 226.0 lb

## 2019-02-04 DIAGNOSIS — E782 Mixed hyperlipidemia: Secondary | ICD-10-CM

## 2019-02-04 DIAGNOSIS — I10 Essential (primary) hypertension: Secondary | ICD-10-CM | POA: Diagnosis not present

## 2019-02-04 DIAGNOSIS — I25119 Atherosclerotic heart disease of native coronary artery with unspecified angina pectoris: Secondary | ICD-10-CM | POA: Diagnosis not present

## 2019-02-04 DIAGNOSIS — R011 Cardiac murmur, unspecified: Secondary | ICD-10-CM

## 2019-02-04 DIAGNOSIS — I480 Paroxysmal atrial fibrillation: Secondary | ICD-10-CM | POA: Diagnosis not present

## 2019-02-04 MED ORDER — ASPIRIN EC 81 MG PO TBEC: 81.0000 mg | DELAYED_RELEASE_TABLET | Freq: Every day | ORAL | 3 refills | Status: DC

## 2019-02-04 NOTE — Patient Instructions (Addendum)

## 2019-02-04 NOTE — Progress Notes (Signed)
Cardiology Office Note  Date: 02/04/2019   ID: [name] [name], DOB 12-04-1940, MRN [id number]  PCP:  Caryl Bis, MD  Cardiologist:  Rozann Lesches, MD Electrophysiologist:  None   Chief Complaint  Patient presents with  . Cardiac follow-up    History of Present Illness: [name] [name] is a 78 y.o. male last seen in June by Ms. Strader PA-C. He presents for a routine visit.  He tells me that he has been doing well, walks a mile and a half almost every day each week.  He does not report any angina symptoms and describes NYHA class II dyspnea.  At last encounter he was placed on hydralazine 50 mg twice daily for further management of blood pressure.  This has been uptitrated, his systolic is in the [id number] today.  I reviewed his medications.  Current cardiac regimen includes aspirin, Eliquis, Lipitor, Lotensin, Cardizem CD, hydralazine, and Toprol-XL.  He reports no obvious intolerances.  No bleeding problems.  I reviewed his lipids from January as outlined below.  LDL was 42 at that time.  Last echocardiogram was in 2019.  Past Medical History:  Diagnosis Date  . Anemia   . Arthritis   . CAD (coronary artery disease)    DES to circumflex 12/2017  . Gout   . Headache   . History of kidney stones   . Hypertension   . NSTEMI (non-ST elevated myocardial infarction) ([place]) 12/31/2017  . Paroxysmal atrial fibrillation (HCC)   . Pericardial effusion    Idiopathic, recurrent pericardial effusion s/p pericardial window.  . Prostate cancer ([place]) 2007   S/P Seed implants   . Supraventricular tachycardia ([place])   . Temporal arteritis [facility])     Past Surgical History:  Procedure Laterality Date  . CATARACT EXTRACTION W/PHACO Left 06/07/2015   Procedure: CATARACT EXTRACTION PHACO AND INTRAOCULAR LENS PLACEMENT LEFT EYE CDE=8.00;  Surgeon: Tonny Branch, MD;  Location: [facility];  Service: Ophthalmology;  Laterality: Left;  . CATARACT EXTRACTION W/PHACO Right 06/17/2015   Procedure:  CATARACT EXTRACTION PHACO AND INTRAOCULAR LENS PLACEMENT RIGHT EYE CDE=11.09;  Surgeon: Tonny Branch, MD;  Location: [facility];  Service: Ophthalmology;  Laterality: Right;  . CORONARY ANGIOPLASTY WITH STENT PLACEMENT  01/01/2018  . CORONARY STENT INTERVENTION N/A 01/01/2018   Procedure: CORONARY STENT INTERVENTION;  Surgeon: Jettie Booze, MD;  Location: [place] [facility];  Service: Cardiovascular;  Laterality: N/A;  . FRACTURE SURGERY    . INSERTION PROSTATE RADIATION SEED  2007  . KNEE ARTHROSCOPY Left   . LEFT HEART CATH AND CORONARY ANGIOGRAPHY N/A 01/01/2018   Procedure: LEFT HEART CATH AND CORONARY ANGIOGRAPHY;  Surgeon: Jettie Booze, MD;  Location: [place] [facility];  Service: Cardiovascular;  Laterality: N/A;  . ORIF TIBIA & FIBULA FRACTURES Left 2003   Distal tibial/fibula   . PERICARDIAL WINDOW  December 2008  . PERICARDIOCENTESIS  2007   hx/notes 10/19/2011    Current Outpatient Medications  Medication Sig Dispense Refill  . allopurinol (ZYLOPRIM) 300 MG tablet Take 450 mg by mouth daily.     Marland Kitchen apixaban (ELIQUIS) 5 MG TABS tablet Take 1 tablet (5 mg total) by mouth 2 (two) times daily. 60 tablet 0  . atorvastatin (LIPITOR) 80 MG tablet TAKE 1 TABLET BY MOUTH ONCE DAILY AT  6PM 90 tablet 1  . benazepril (LOTENSIN) 40 MG tablet Take 1 tablet (40 mg total) by mouth daily. 90 tablet 0  . diltiazem (CARDIZEM CD) 180 MG 24 hr  capsule Take 1 capsule (180 mg total) by mouth daily. 30 capsule 1  . ferrous sulfate 325 (65 FE) MG tablet Take 325 mg by mouth 2 (two) times daily.    . hydrALAZINE (APRESOLINE) 100 MG tablet Take 100 mg by mouth 2 (two) times daily.    . metoprolol succinate (TOPROL-XL) 50 MG 24 hr tablet Take 1 tablet (50 mg total) by mouth daily. Take with or immediately following a meal. 90 tablet 3  . Multiple Vitamin (MULTIVITAMIN) tablet Take 1 tablet by mouth daily.    . pantoprazole (PROTONIX) 40 MG tablet Take 1 tablet by mouth once daily 30 tablet 0  .  vitamin C (ASCORBIC ACID) 500 MG tablet Take 500 mg by mouth 2 (two) times daily.     Marland Kitchen aspirin EC 81 MG tablet Take 1 tablet (81 mg total) by mouth daily. 90 tablet 3   No current facility-administered medications for this visit.    Allergies:  Amiodarone   Social History: The patient  reports that he quit smoking about 52 years ago. His smoking use included cigarettes. He started smoking about 60 years ago. He has a 3.00 pack-year smoking history. He has never used smokeless tobacco. He reports previous alcohol use. He reports that he does not use drugs.   ROS:  Please see the history of present illness. Otherwise, complete review of systems is positive for hearing loss.  All other systems are reviewed and negative.   Physical Exam: VS:  BP (!) 124/58   Pulse (!) 57   Ht 6' (1.829 m)   Wt 226 lb (102.5 kg)   SpO2 98%   BMI 30.65 kg/m , BMI Body mass index is 30.65 kg/m.  Wt Readings from Last 3 Encounters:  02/04/19 226 lb (102.5 kg)  09/19/18 221 lb (100.2 kg)  08/08/18 218 lb (98.9 kg)    General: Elderly male, appears comfortable at rest. HEENT: Conjunctiva and lids normal, wearing a mask. Neck: Supple, no elevated JVP, radiation of cardiac murmur, no thyromegaly. Lungs: Clear to auscultation, nonlabored breathing at rest. Cardiac: Regular rate and rhythm, no S3, 3/6 systolic murmur, no pericardial rub. Abdomen: Soft, nontender, bowel sounds present. Extremities: No pitting edema, distal pulses 2+. Skin: Warm and dry. Musculoskeletal: No kyphosis. Neuropsychiatric: Alert and oriented x3, affect grossly appropriate.  ECG:  An ECG dated 09/19/2018 was personally reviewed today and demonstrated:  Sinus rhythm with low voltage.  Recent Labwork: 07/24/2018: Magnesium 2.2 07/29/2018: ALT 22; AST 22; BUN 25; Creatinine, Ser 0.83; Hemoglobin 10.5; Platelets 192; Potassium 4.0; Sodium 139     Component Value Date/Time   CHOL 142 01/01/2018 0626   TRIG 73 01/01/2018 0626   HDL 34  (L) 01/01/2018 0626   CHOLHDL 4.2 01/01/2018 0626   VLDL 15 01/01/2018 0626   LDLCALC 93 01/01/2018 0626  January 2020: Cholesterol 94, triglycerides 54, HDL 41, LDL 42  Other Studies Reviewed Today:  Echocardiogram 01/02/2018: Study Conclusions  - Left ventricle: The cavity size was normal. There was severe   focal basal hypertrophy. Systolic function was normal. The   estimated ejection fraction was in the range of 55% to 60%. Wall   motion was normal; there were no regional wall motion   abnormalities. There was an increased relative contribution of   atrial contraction to ventricular filling. Doppler parameters are   consistent with abnormal left ventricular relaxation (grade 1   diastolic dysfunction). - Mitral valve: Calcified annulus. Valve area by pressure   half-time: 1.59  cm^2. - Left atrium: The atrium was mildly dilated. - Pulmonic valve: There was trivial regurgitation. - Pulmonary arteries: Systolic pressure could not be accurately   estimated. - Pericardium, extracardiac: A trivial pericardial effusion was   identified posterior to the heart.  Cardiac catheterization 01/01/2018:  Mid Cx lesion is 95% stenosed.  Prox LAD lesion is 25% stenosed.  Mid LAD lesion is 30% stenosed.  Prox RCA to Mid RCA lesion is 50% stenosed.  The left ventricular systolic function is normal.  LV end diastolic pressure is normal. LVEDP 12 mm Hg.  The left ventricular ejection fraction is 50-55% by visual estimate.  There is no aortic valve stenosis.  Severe tortuosity in the right subclavian which makes torquing catheters difficult. Would consider femoral approach in the future, particularly if emergent cardiac cath was needed.  A drug-eluting stent was successfully placed using a STENT SIERRA 3.25 X 15 MM.  Post intervention, there is a 0% residual stenosis.  Recommend uninterrupted dual antiplatelet therapy with Aspirin 81mg  daily and Ticagrelor 90mg  twice daily for a  minimum of 12 months (ACS - Class I recommendation).   Continue aggressive secondary prevention.  IV Cangrelor for 2 hours.  Cangrelor was used in the event there was any bleeding issue in the forearm, it could be turned off and would wear off quickly.  Addendum: The patient is a candidate for the Xience short DAPT study due to his age.  If he chooses to participate in the study and his bleeding risk is considered higher than his ischemic risk, I think it would be reasonable to shorten his DAPT duration to 30 days.    Assessment and Plan:  1.  CAD status post DES to the circumflex in October 2019.  He continues on low-dose aspirin and statin therapy.  2.  Paroxysmal atrial fibrillation.  Reports no progressive palpitations and continues on combination of Cardizem CD and Toprol-XL along with Eliquis for stroke prophylaxis.  I reviewed his interval lab work.  3.  Aortic valve calcification, nonstenotic at cardiac catheterization last year, but with prominent systolic murmur.  We will obtain a follow-up echocardiogram in comparison to previous study from October 2019.  4.  Essential hypertension, systolics in the [id number] today.  No changes to current regimen.  5.  Mixed hyperlipidemia, continues on statin therapy.  Last LDL 42.  Medication Adjustments/Labs and Tests Ordered: Current medicines are reviewed at length with the patient today.  Concerns regarding medicines are outlined above.   Tests Ordered: Orders Placed This Encounter  Procedures  . ECHOCARDIOGRAM COMPLETE    Medication Changes: Meds ordered this encounter  Medications  . aspirin EC 81 MG tablet    Sig: Take 1 tablet (81 mg total) by mouth daily.    Dispense:  90 tablet    Refill:  3    Disposition:  Follow up 6 months in the [place] office.  Signed, Satira Sark, MD, [facility] 02/04/2019 1:48 PM    [place] at [place], [place], [place] [address] Phone: [phone]; Fax:  [phone]

## 2019-02-10 DIAGNOSIS — M9902 Segmental and somatic dysfunction of thoracic region: Secondary | ICD-10-CM | POA: Diagnosis not present

## 2019-02-10 DIAGNOSIS — S233XXA Sprain of ligaments of thoracic spine, initial encounter: Secondary | ICD-10-CM | POA: Diagnosis not present

## 2019-02-10 DIAGNOSIS — M9903 Segmental and somatic dysfunction of lumbar region: Secondary | ICD-10-CM | POA: Diagnosis not present

## 2019-02-10 DIAGNOSIS — S338XXA Sprain of other parts of lumbar spine and pelvis, initial encounter: Secondary | ICD-10-CM | POA: Diagnosis not present

## 2019-02-10 DIAGNOSIS — M9901 Segmental and somatic dysfunction of cervical region: Secondary | ICD-10-CM | POA: Diagnosis not present

## 2019-02-10 DIAGNOSIS — S134XXA Sprain of ligaments of cervical spine, initial encounter: Secondary | ICD-10-CM | POA: Diagnosis not present

## 2019-02-12 ENCOUNTER — Other Ambulatory Visit: Payer: Self-pay | Admitting: Cardiology

## 2019-02-13 DIAGNOSIS — H524 Presbyopia: Secondary | ICD-10-CM | POA: Diagnosis not present

## 2019-02-13 DIAGNOSIS — R69 Illness, unspecified: Secondary | ICD-10-CM | POA: Diagnosis not present

## 2019-02-13 DIAGNOSIS — H401121 Primary open-angle glaucoma, left eye, mild stage: Secondary | ICD-10-CM | POA: Diagnosis not present

## 2019-02-17 DIAGNOSIS — M9903 Segmental and somatic dysfunction of lumbar region: Secondary | ICD-10-CM | POA: Diagnosis not present

## 2019-02-17 DIAGNOSIS — S338XXA Sprain of other parts of lumbar spine and pelvis, initial encounter: Secondary | ICD-10-CM | POA: Diagnosis not present

## 2019-02-17 DIAGNOSIS — S233XXA Sprain of ligaments of thoracic spine, initial encounter: Secondary | ICD-10-CM | POA: Diagnosis not present

## 2019-02-17 DIAGNOSIS — M9902 Segmental and somatic dysfunction of thoracic region: Secondary | ICD-10-CM | POA: Diagnosis not present

## 2019-02-17 DIAGNOSIS — M9901 Segmental and somatic dysfunction of cervical region: Secondary | ICD-10-CM | POA: Diagnosis not present

## 2019-02-17 DIAGNOSIS — S134XXA Sprain of ligaments of cervical spine, initial encounter: Secondary | ICD-10-CM | POA: Diagnosis not present

## 2019-02-18 DIAGNOSIS — D529 Folate deficiency anemia, unspecified: Secondary | ICD-10-CM | POA: Diagnosis not present

## 2019-02-18 DIAGNOSIS — D649 Anemia, unspecified: Secondary | ICD-10-CM | POA: Diagnosis not present

## 2019-02-18 DIAGNOSIS — D519 Vitamin B12 deficiency anemia, unspecified: Secondary | ICD-10-CM | POA: Diagnosis not present

## 2019-02-24 DIAGNOSIS — I480 Paroxysmal atrial fibrillation: Secondary | ICD-10-CM | POA: Diagnosis not present

## 2019-02-24 DIAGNOSIS — I10 Essential (primary) hypertension: Secondary | ICD-10-CM | POA: Diagnosis not present

## 2019-03-04 ENCOUNTER — Telehealth: Payer: Self-pay | Admitting: Cardiology

## 2019-03-04 NOTE — Telephone Encounter (Signed)

## 2019-03-05 ENCOUNTER — Other Ambulatory Visit: Payer: Self-pay

## 2019-03-05 ENCOUNTER — Ambulatory Visit (INDEPENDENT_AMBULATORY_CARE_PROVIDER_SITE_OTHER): Payer: Medicare HMO

## 2019-03-05 DIAGNOSIS — R011 Cardiac murmur, unspecified: Secondary | ICD-10-CM

## 2019-03-06 ENCOUNTER — Telehealth: Payer: Self-pay | Admitting: *Deleted

## 2019-03-06 NOTE — Telephone Encounter (Signed)
Wife informed. Copy sent to PCP 

## 2019-03-06 NOTE — Telephone Encounter (Signed)
-----   Message from Satira Sark, MD sent at 03/05/2019 10:28 AM EST ----- Results reviewed.  Testing done for follow-up of heart murmur.  He does have significant calcification of the aortic valve but no obvious stenosis and this was also confirmed by his last heart catheterization.  No decline in LVEF, vigorous at 70 to 75%.  Continue with current follow-up plan.

## 2019-03-10 DIAGNOSIS — S338XXA Sprain of other parts of lumbar spine and pelvis, initial encounter: Secondary | ICD-10-CM | POA: Diagnosis not present

## 2019-03-10 DIAGNOSIS — S233XXA Sprain of ligaments of thoracic spine, initial encounter: Secondary | ICD-10-CM | POA: Diagnosis not present

## 2019-03-10 DIAGNOSIS — M9902 Segmental and somatic dysfunction of thoracic region: Secondary | ICD-10-CM | POA: Diagnosis not present

## 2019-03-10 DIAGNOSIS — M9903 Segmental and somatic dysfunction of lumbar region: Secondary | ICD-10-CM | POA: Diagnosis not present

## 2019-03-10 DIAGNOSIS — M9901 Segmental and somatic dysfunction of cervical region: Secondary | ICD-10-CM | POA: Diagnosis not present

## 2019-03-10 DIAGNOSIS — S134XXA Sprain of ligaments of cervical spine, initial encounter: Secondary | ICD-10-CM | POA: Diagnosis not present

## 2019-03-11 DIAGNOSIS — H16223 Keratoconjunctivitis sicca, not specified as Sjogren's, bilateral: Secondary | ICD-10-CM | POA: Diagnosis not present

## 2019-03-11 DIAGNOSIS — I10 Essential (primary) hypertension: Secondary | ICD-10-CM | POA: Diagnosis not present

## 2019-03-11 DIAGNOSIS — Z9842 Cataract extraction status, left eye: Secondary | ICD-10-CM | POA: Diagnosis not present

## 2019-03-11 DIAGNOSIS — H35033 Hypertensive retinopathy, bilateral: Secondary | ICD-10-CM | POA: Diagnosis not present

## 2019-03-11 DIAGNOSIS — H401112 Primary open-angle glaucoma, right eye, moderate stage: Secondary | ICD-10-CM | POA: Diagnosis not present

## 2019-03-11 DIAGNOSIS — Z961 Presence of intraocular lens: Secondary | ICD-10-CM | POA: Diagnosis not present

## 2019-03-11 DIAGNOSIS — Z9841 Cataract extraction status, right eye: Secondary | ICD-10-CM | POA: Diagnosis not present

## 2019-03-11 DIAGNOSIS — H35031 Hypertensive retinopathy, right eye: Secondary | ICD-10-CM | POA: Diagnosis not present

## 2019-03-11 DIAGNOSIS — H401121 Primary open-angle glaucoma, left eye, mild stage: Secondary | ICD-10-CM | POA: Diagnosis not present

## 2019-03-11 DIAGNOSIS — H04123 Dry eye syndrome of bilateral lacrimal glands: Secondary | ICD-10-CM | POA: Diagnosis not present

## 2019-03-15 DIAGNOSIS — I4891 Unspecified atrial fibrillation: Secondary | ICD-10-CM | POA: Diagnosis not present

## 2019-03-15 DIAGNOSIS — I252 Old myocardial infarction: Secondary | ICD-10-CM | POA: Diagnosis not present

## 2019-03-15 DIAGNOSIS — K219 Gastro-esophageal reflux disease without esophagitis: Secondary | ICD-10-CM | POA: Diagnosis not present

## 2019-03-15 DIAGNOSIS — I10 Essential (primary) hypertension: Secondary | ICD-10-CM | POA: Diagnosis not present

## 2019-03-15 DIAGNOSIS — I2510 Atherosclerotic heart disease of native coronary artery without angina pectoris: Secondary | ICD-10-CM | POA: Diagnosis not present

## 2019-03-15 DIAGNOSIS — M109 Gout, unspecified: Secondary | ICD-10-CM | POA: Diagnosis not present

## 2019-03-15 DIAGNOSIS — H409 Unspecified glaucoma: Secondary | ICD-10-CM | POA: Diagnosis not present

## 2019-03-15 DIAGNOSIS — D6869 Other thrombophilia: Secondary | ICD-10-CM | POA: Diagnosis not present

## 2019-03-15 DIAGNOSIS — E785 Hyperlipidemia, unspecified: Secondary | ICD-10-CM | POA: Diagnosis not present

## 2019-03-15 DIAGNOSIS — E669 Obesity, unspecified: Secondary | ICD-10-CM | POA: Diagnosis not present

## 2019-03-19 ENCOUNTER — Other Ambulatory Visit: Payer: Self-pay | Admitting: Cardiology

## 2019-03-19 MED ORDER — APIXABAN 5 MG PO TABS: 5.0000 mg | ORAL_TABLET | Freq: Two times a day (BID) | ORAL | 6 refills | Status: DC

## 2019-03-19 NOTE — Telephone Encounter (Signed)
refilled eliquis 

## 2019-03-22 DIAGNOSIS — U071 COVID-19: Secondary | ICD-10-CM | POA: Diagnosis not present

## 2019-03-22 DIAGNOSIS — Z6831 Body mass index (BMI) 31.0-31.9, adult: Secondary | ICD-10-CM | POA: Diagnosis not present

## 2019-03-27 DIAGNOSIS — I10 Essential (primary) hypertension: Secondary | ICD-10-CM | POA: Diagnosis not present

## 2019-03-27 DIAGNOSIS — E782 Mixed hyperlipidemia: Secondary | ICD-10-CM | POA: Diagnosis not present

## 2019-04-03 DIAGNOSIS — R69 Illness, unspecified: Secondary | ICD-10-CM | POA: Diagnosis not present

## 2019-04-04 DIAGNOSIS — D519 Vitamin B12 deficiency anemia, unspecified: Secondary | ICD-10-CM | POA: Diagnosis not present

## 2019-04-04 DIAGNOSIS — D509 Iron deficiency anemia, unspecified: Secondary | ICD-10-CM | POA: Diagnosis not present

## 2019-04-04 DIAGNOSIS — E1122 Type 2 diabetes mellitus with diabetic chronic kidney disease: Secondary | ICD-10-CM | POA: Diagnosis not present

## 2019-04-04 DIAGNOSIS — E1165 Type 2 diabetes mellitus with hyperglycemia: Secondary | ICD-10-CM | POA: Diagnosis not present

## 2019-04-04 DIAGNOSIS — I10 Essential (primary) hypertension: Secondary | ICD-10-CM | POA: Diagnosis not present

## 2019-04-04 DIAGNOSIS — D529 Folate deficiency anemia, unspecified: Secondary | ICD-10-CM | POA: Diagnosis not present

## 2019-04-04 DIAGNOSIS — K219 Gastro-esophageal reflux disease without esophagitis: Secondary | ICD-10-CM | POA: Diagnosis not present

## 2019-04-04 DIAGNOSIS — N182 Chronic kidney disease, stage 2 (mild): Secondary | ICD-10-CM | POA: Diagnosis not present

## 2019-04-04 DIAGNOSIS — R946 Abnormal results of thyroid function studies: Secondary | ICD-10-CM | POA: Diagnosis not present

## 2019-04-04 DIAGNOSIS — C61 Malignant neoplasm of prostate: Secondary | ICD-10-CM | POA: Diagnosis not present

## 2019-04-04 DIAGNOSIS — E782 Mixed hyperlipidemia: Secondary | ICD-10-CM | POA: Diagnosis not present

## 2019-04-04 DIAGNOSIS — I25119 Atherosclerotic heart disease of native coronary artery with unspecified angina pectoris: Secondary | ICD-10-CM | POA: Diagnosis not present

## 2019-04-04 DIAGNOSIS — R739 Hyperglycemia, unspecified: Secondary | ICD-10-CM | POA: Diagnosis not present

## 2019-04-04 DIAGNOSIS — D649 Anemia, unspecified: Secondary | ICD-10-CM | POA: Diagnosis not present

## 2019-04-04 DIAGNOSIS — I482 Chronic atrial fibrillation, unspecified: Secondary | ICD-10-CM | POA: Diagnosis not present

## 2019-04-08 DIAGNOSIS — S134XXA Sprain of ligaments of cervical spine, initial encounter: Secondary | ICD-10-CM | POA: Diagnosis not present

## 2019-04-08 DIAGNOSIS — S233XXA Sprain of ligaments of thoracic spine, initial encounter: Secondary | ICD-10-CM | POA: Diagnosis not present

## 2019-04-08 DIAGNOSIS — S338XXA Sprain of other parts of lumbar spine and pelvis, initial encounter: Secondary | ICD-10-CM | POA: Diagnosis not present

## 2019-04-08 DIAGNOSIS — M9901 Segmental and somatic dysfunction of cervical region: Secondary | ICD-10-CM | POA: Diagnosis not present

## 2019-04-08 DIAGNOSIS — M9903 Segmental and somatic dysfunction of lumbar region: Secondary | ICD-10-CM | POA: Diagnosis not present

## 2019-04-08 DIAGNOSIS — M9902 Segmental and somatic dysfunction of thoracic region: Secondary | ICD-10-CM | POA: Diagnosis not present

## 2019-04-09 DIAGNOSIS — G2581 Restless legs syndrome: Secondary | ICD-10-CM | POA: Diagnosis not present

## 2019-04-09 DIAGNOSIS — Z0001 Encounter for general adult medical examination with abnormal findings: Secondary | ICD-10-CM | POA: Diagnosis not present

## 2019-04-09 DIAGNOSIS — Z6830 Body mass index (BMI) 30.0-30.9, adult: Secondary | ICD-10-CM | POA: Diagnosis not present

## 2019-04-09 DIAGNOSIS — Z1212 Encounter for screening for malignant neoplasm of rectum: Secondary | ICD-10-CM | POA: Diagnosis not present

## 2019-04-09 DIAGNOSIS — D692 Other nonthrombocytopenic purpura: Secondary | ICD-10-CM | POA: Diagnosis not present

## 2019-04-09 DIAGNOSIS — Z23 Encounter for immunization: Secondary | ICD-10-CM | POA: Diagnosis not present

## 2019-04-09 DIAGNOSIS — M1000 Idiopathic gout, unspecified site: Secondary | ICD-10-CM | POA: Diagnosis not present

## 2019-04-09 DIAGNOSIS — E1169 Type 2 diabetes mellitus with other specified complication: Secondary | ICD-10-CM | POA: Diagnosis not present

## 2019-04-14 DIAGNOSIS — M9901 Segmental and somatic dysfunction of cervical region: Secondary | ICD-10-CM | POA: Diagnosis not present

## 2019-04-14 DIAGNOSIS — S134XXA Sprain of ligaments of cervical spine, initial encounter: Secondary | ICD-10-CM | POA: Diagnosis not present

## 2019-04-14 DIAGNOSIS — S338XXA Sprain of other parts of lumbar spine and pelvis, initial encounter: Secondary | ICD-10-CM | POA: Diagnosis not present

## 2019-04-14 DIAGNOSIS — S233XXA Sprain of ligaments of thoracic spine, initial encounter: Secondary | ICD-10-CM | POA: Diagnosis not present

## 2019-04-14 DIAGNOSIS — M9902 Segmental and somatic dysfunction of thoracic region: Secondary | ICD-10-CM | POA: Diagnosis not present

## 2019-04-14 DIAGNOSIS — M9903 Segmental and somatic dysfunction of lumbar region: Secondary | ICD-10-CM | POA: Diagnosis not present

## 2019-04-18 DIAGNOSIS — D649 Anemia, unspecified: Secondary | ICD-10-CM | POA: Diagnosis not present

## 2019-04-18 DIAGNOSIS — R195 Other fecal abnormalities: Secondary | ICD-10-CM | POA: Diagnosis not present

## 2019-04-25 DIAGNOSIS — E7849 Other hyperlipidemia: Secondary | ICD-10-CM | POA: Diagnosis not present

## 2019-04-25 DIAGNOSIS — I10 Essential (primary) hypertension: Secondary | ICD-10-CM | POA: Diagnosis not present

## 2019-04-28 DIAGNOSIS — M9901 Segmental and somatic dysfunction of cervical region: Secondary | ICD-10-CM | POA: Diagnosis not present

## 2019-04-28 DIAGNOSIS — S338XXA Sprain of other parts of lumbar spine and pelvis, initial encounter: Secondary | ICD-10-CM | POA: Diagnosis not present

## 2019-04-28 DIAGNOSIS — M9902 Segmental and somatic dysfunction of thoracic region: Secondary | ICD-10-CM | POA: Diagnosis not present

## 2019-04-28 DIAGNOSIS — S134XXA Sprain of ligaments of cervical spine, initial encounter: Secondary | ICD-10-CM | POA: Diagnosis not present

## 2019-04-28 DIAGNOSIS — M9903 Segmental and somatic dysfunction of lumbar region: Secondary | ICD-10-CM | POA: Diagnosis not present

## 2019-04-28 DIAGNOSIS — S233XXA Sprain of ligaments of thoracic spine, initial encounter: Secondary | ICD-10-CM | POA: Diagnosis not present

## 2019-05-02 DIAGNOSIS — Z01818 Encounter for other preprocedural examination: Secondary | ICD-10-CM | POA: Diagnosis not present

## 2019-05-05 DIAGNOSIS — D649 Anemia, unspecified: Secondary | ICD-10-CM | POA: Diagnosis not present

## 2019-05-05 DIAGNOSIS — K2970 Gastritis, unspecified, without bleeding: Secondary | ICD-10-CM | POA: Diagnosis not present

## 2019-05-05 DIAGNOSIS — I252 Old myocardial infarction: Secondary | ICD-10-CM | POA: Diagnosis not present

## 2019-05-05 DIAGNOSIS — K921 Melena: Secondary | ICD-10-CM | POA: Diagnosis not present

## 2019-05-05 DIAGNOSIS — K2980 Duodenitis without bleeding: Secondary | ICD-10-CM | POA: Diagnosis not present

## 2019-05-05 DIAGNOSIS — K317 Polyp of stomach and duodenum: Secondary | ICD-10-CM | POA: Diagnosis not present

## 2019-05-05 DIAGNOSIS — K635 Polyp of colon: Secondary | ICD-10-CM | POA: Diagnosis not present

## 2019-05-05 DIAGNOSIS — I10 Essential (primary) hypertension: Secondary | ICD-10-CM | POA: Diagnosis not present

## 2019-05-05 DIAGNOSIS — M109 Gout, unspecified: Secondary | ICD-10-CM | POA: Diagnosis not present

## 2019-05-05 DIAGNOSIS — R195 Other fecal abnormalities: Secondary | ICD-10-CM | POA: Diagnosis not present

## 2019-05-05 DIAGNOSIS — I2510 Atherosclerotic heart disease of native coronary artery without angina pectoris: Secondary | ICD-10-CM | POA: Diagnosis not present

## 2019-05-05 DIAGNOSIS — Z8546 Personal history of malignant neoplasm of prostate: Secondary | ICD-10-CM | POA: Diagnosis not present

## 2019-05-12 DIAGNOSIS — S338XXA Sprain of other parts of lumbar spine and pelvis, initial encounter: Secondary | ICD-10-CM | POA: Diagnosis not present

## 2019-05-12 DIAGNOSIS — S134XXA Sprain of ligaments of cervical spine, initial encounter: Secondary | ICD-10-CM | POA: Diagnosis not present

## 2019-05-12 DIAGNOSIS — S233XXA Sprain of ligaments of thoracic spine, initial encounter: Secondary | ICD-10-CM | POA: Diagnosis not present

## 2019-05-12 DIAGNOSIS — M9903 Segmental and somatic dysfunction of lumbar region: Secondary | ICD-10-CM | POA: Diagnosis not present

## 2019-05-12 DIAGNOSIS — M9901 Segmental and somatic dysfunction of cervical region: Secondary | ICD-10-CM | POA: Diagnosis not present

## 2019-05-12 DIAGNOSIS — M9902 Segmental and somatic dysfunction of thoracic region: Secondary | ICD-10-CM | POA: Diagnosis not present

## 2019-05-20 DIAGNOSIS — A048 Other specified bacterial intestinal infections: Secondary | ICD-10-CM | POA: Diagnosis not present

## 2019-05-20 DIAGNOSIS — R195 Other fecal abnormalities: Secondary | ICD-10-CM | POA: Diagnosis not present

## 2019-05-20 DIAGNOSIS — D649 Anemia, unspecified: Secondary | ICD-10-CM | POA: Diagnosis not present

## 2019-06-09 DIAGNOSIS — M9903 Segmental and somatic dysfunction of lumbar region: Secondary | ICD-10-CM | POA: Diagnosis not present

## 2019-06-09 DIAGNOSIS — M9902 Segmental and somatic dysfunction of thoracic region: Secondary | ICD-10-CM | POA: Diagnosis not present

## 2019-06-09 DIAGNOSIS — M9901 Segmental and somatic dysfunction of cervical region: Secondary | ICD-10-CM | POA: Diagnosis not present

## 2019-06-09 DIAGNOSIS — S233XXA Sprain of ligaments of thoracic spine, initial encounter: Secondary | ICD-10-CM | POA: Diagnosis not present

## 2019-06-09 DIAGNOSIS — S134XXA Sprain of ligaments of cervical spine, initial encounter: Secondary | ICD-10-CM | POA: Diagnosis not present

## 2019-06-09 DIAGNOSIS — S338XXA Sprain of other parts of lumbar spine and pelvis, initial encounter: Secondary | ICD-10-CM | POA: Diagnosis not present

## 2019-06-19 ENCOUNTER — Other Ambulatory Visit: Payer: Self-pay | Admitting: Cardiology

## 2019-06-25 DIAGNOSIS — E7849 Other hyperlipidemia: Secondary | ICD-10-CM | POA: Diagnosis not present

## 2019-06-25 DIAGNOSIS — I10 Essential (primary) hypertension: Secondary | ICD-10-CM | POA: Diagnosis not present

## 2019-07-01 DIAGNOSIS — H401112 Primary open-angle glaucoma, right eye, moderate stage: Secondary | ICD-10-CM | POA: Diagnosis not present

## 2019-07-01 DIAGNOSIS — H401121 Primary open-angle glaucoma, left eye, mild stage: Secondary | ICD-10-CM | POA: Diagnosis not present

## 2019-07-07 DIAGNOSIS — M9902 Segmental and somatic dysfunction of thoracic region: Secondary | ICD-10-CM | POA: Diagnosis not present

## 2019-07-07 DIAGNOSIS — S233XXA Sprain of ligaments of thoracic spine, initial encounter: Secondary | ICD-10-CM | POA: Diagnosis not present

## 2019-07-07 DIAGNOSIS — S134XXA Sprain of ligaments of cervical spine, initial encounter: Secondary | ICD-10-CM | POA: Diagnosis not present

## 2019-07-07 DIAGNOSIS — M9901 Segmental and somatic dysfunction of cervical region: Secondary | ICD-10-CM | POA: Diagnosis not present

## 2019-07-07 DIAGNOSIS — S338XXA Sprain of other parts of lumbar spine and pelvis, initial encounter: Secondary | ICD-10-CM | POA: Diagnosis not present

## 2019-07-07 DIAGNOSIS — M9903 Segmental and somatic dysfunction of lumbar region: Secondary | ICD-10-CM | POA: Diagnosis not present

## 2019-08-04 DIAGNOSIS — S338XXA Sprain of other parts of lumbar spine and pelvis, initial encounter: Secondary | ICD-10-CM | POA: Diagnosis not present

## 2019-08-04 DIAGNOSIS — S233XXA Sprain of ligaments of thoracic spine, initial encounter: Secondary | ICD-10-CM | POA: Diagnosis not present

## 2019-08-04 DIAGNOSIS — M9902 Segmental and somatic dysfunction of thoracic region: Secondary | ICD-10-CM | POA: Diagnosis not present

## 2019-08-04 DIAGNOSIS — M9903 Segmental and somatic dysfunction of lumbar region: Secondary | ICD-10-CM | POA: Diagnosis not present

## 2019-08-04 DIAGNOSIS — M9901 Segmental and somatic dysfunction of cervical region: Secondary | ICD-10-CM | POA: Diagnosis not present

## 2019-08-04 DIAGNOSIS — S134XXA Sprain of ligaments of cervical spine, initial encounter: Secondary | ICD-10-CM | POA: Diagnosis not present

## 2019-08-04 NOTE — Progress Notes (Signed)
Cardiology Office Note  Date: 08/05/2019   ID: [name] [name], DOB [birth year], MRN [id number]  PCP:  [name] Bis, [name]  Cardiologist:  [name] [name] Electrophysiologist:  None   Chief Complaint  Patient presents with  . Cardiac follow-up    History of Present Illness: [name] [name] is a 79 y.o. male last seen in November 2020.  He presents for a routine visit.  States that he has been doing very well.  He enjoys walking with a friend, 2 to 3 miles at a time.  No active angina or breathlessness beyond NYHA class II.  Follow-up echocardiogram in December 2020 showed LVEF 75% with moderate to severe LVH, mild diastolic dysfunction, and sclerotic aortic valve with restricted left coronary cusp motion and calcification of the LVOT without obvious stenosis.  Medications are stable from a cardiac perspective and outlined below.  He just had lab work with pending visit per Dr. Quillian [name].  Past Medical History:  Diagnosis Date  . Anemia   . Arthritis   . CAD (coronary artery disease)    DES to circumflex 12/2017  . Gout   . Headache   . History of kidney stones   . Hypertension   . NSTEMI (non-ST elevated myocardial infarction) ([place]) 12/31/2017  . Paroxysmal atrial fibrillation (HCC)   . Pericardial effusion    Idiopathic, recurrent pericardial effusion s/p pericardial window.  . Prostate cancer ([place]) 2007   S/P Seed implants   . Supraventricular tachycardia ([place])   . Temporal arteritis [facility])     Past Surgical History:  Procedure Laterality Date  . CATARACT EXTRACTION W/PHACO Left 06/07/2015   Procedure: CATARACT EXTRACTION PHACO AND INTRAOCULAR LENS PLACEMENT LEFT EYE CDE=8.00;  Surgeon: Tonny Branch, [name];  Location: [facility];  Service: Ophthalmology;  Laterality: Left;  . CATARACT EXTRACTION W/PHACO Right 06/17/2015   Procedure: CATARACT EXTRACTION PHACO AND INTRAOCULAR LENS PLACEMENT RIGHT EYE CDE=11.09;  Surgeon: Tonny Branch, [name];  Location: [facility];  Service: Ophthalmology;   Laterality: Right;  . CORONARY ANGIOPLASTY WITH STENT PLACEMENT  01/01/2018  . CORONARY STENT INTERVENTION N/A 01/01/2018   Procedure: CORONARY STENT INTERVENTION;  Surgeon: Jettie Booze, [name];  Location: [place] [facility] LAB;  Service: Cardiovascular;  Laterality: N/A;  . FRACTURE SURGERY    . INSERTION PROSTATE RADIATION SEED  2007  . KNEE ARTHROSCOPY Left   . LEFT HEART CATH AND CORONARY ANGIOGRAPHY N/A 01/01/2018   Procedure: LEFT HEART CATH AND CORONARY ANGIOGRAPHY;  Surgeon: Jettie Booze, [name];  Location: [place] [facility];  Service: Cardiovascular;  Laterality: N/A;  . ORIF TIBIA & FIBULA FRACTURES Left 2003   Distal tibial/fibula   . PERICARDIAL WINDOW  December 2008  . PERICARDIOCENTESIS  2007   hx/notes 10/19/2011    Current Outpatient Medications  Medication Sig Dispense Refill  . allopurinol (ZYLOPRIM) 300 MG tablet Take 450 mg by mouth daily.     Marland Kitchen apixaban (ELIQUIS) 5 MG TABS tablet Take 1 tablet (5 mg total) by mouth 2 (two) times daily. 60 tablet 6  . aspirin EC 81 MG tablet Take 1 tablet (81 mg total) by mouth daily. 90 tablet 3  . atorvastatin (LIPITOR) 80 MG tablet TAKE 1 TABLET BY MOUTH ONCE DAILY AT  6  PM 90 tablet 2  . benazepril (LOTENSIN) 40 MG tablet Take 1 tablet by mouth once daily 90 tablet 2  . diltiazem (CARDIZEM CD) 180 MG 24 hr capsule Take 1 capsule (180 mg total) by mouth daily.  30 capsule 1  . ferrous sulfate 325 (65 FE) MG tablet Take 325 mg by mouth 2 (two) times daily.    . hydrALAZINE (APRESOLINE) 100 MG tablet Take 100 mg by mouth 2 (two) times daily.    . metoprolol succinate (TOPROL-XL) 50 MG 24 hr tablet Take 1 tablet (50 mg total) by mouth daily. Take with or immediately following a meal. 90 tablet 3  . Multiple Vitamin (MULTIVITAMIN) tablet Take 1 tablet by mouth daily.    . pantoprazole (PROTONIX) 40 MG tablet TAKE 1 TABLET BY MOUTH ONCE DAILY PATIENT  NEEDS  APPOINTMENT  FOR  FURTHER  REFILLS 30 tablet 11  . vitamin C (ASCORBIC  ACID) 500 MG tablet Take 500 mg by mouth 2 (two) times daily.      No current facility-administered medications for this visit.   Allergies:  Amiodarone   ROS:   Hearing loss.  Physical Exam: VS:  BP 130/64   Pulse 62   Ht 6' (1.829 m)   Wt 224 lb 6.4 oz (101.8 kg)   SpO2 95%   BMI 30.43 kg/m , BMI Body mass index is 30.43 kg/m.  Wt Readings from Last 3 Encounters:  08/05/19 224 lb 6.4 oz (101.8 kg)  02/04/19 226 lb (102.5 kg)  09/19/18 221 lb (100.2 kg)    General:  Elderly male, appears comfortable at rest. HEENT: Conjunctiva and lids normal, wearing a mask. Neck: Supple, no elevated JVP or carotid bruits, no thyromegaly. Lungs: Clear to auscultation, nonlabored breathing at rest. Cardiac: Regular rate and rhythm, no S3, [id number] systolic murmur, no pericardial rub. Abdomen: Soft, bowel sounds present, no guarding or rebound. Extremities: No pitting edema, distal pulses 2+.  ECG:  An ECG dated 09/19/2018 was personally reviewed today and demonstrated:  Sinus rhythm with low voltage.  Recent Labwork:  January 2020: Cholesterol 94, triglycerides 54, HDL 41, LDL 42 07/24/2018: Magnesium 2.2 07/29/2018: ALT 22; AST 22; BUN 25; Creatinine, Ser 0.83; Hemoglobin 10.5; Platelets 192; Potassium 4.0; Sodium 139   Other Studies Reviewed Today:  Echocardiogram 03/05/2019: 1. Left ventricular ejection fraction, by visual estimation, is 70 to  75%. The left ventricle has hyperdynamic function. There is moderately to  severely increased left ventricular hypertrophy.  2. Left ventricular diastolic parameters are consistent with Grade I  diastolic dysfunction (impaired relaxation).  3. Global right ventricle has normal systolic function.The right  ventricular size is normal. No increase in right ventricular wall  thickness.  4. Left atrial size was severely dilated.  5. Right atrial size was normal.  6. Small pericardial effusion.  7. The pericardial effusion is circumferential.    8. Mild to moderate mitral annular calcification.  9. The mitral valve is degenerative. Trivial mitral valve regurgitation.  10. The tricuspid valve is grossly normal. Tricuspid valve regurgitation  is trivial.  11. The aortic valve is tricuspid. Aortic valve regurgitation is not  visualized. There is restricted motion of the left coronary cusp and  associated moderate, nodular calcification in the LVOT. No definite aortic  stenosis.  12. The pulmonic valve was grossly normal. Pulmonic valve regurgitation is  trivial.  13. Aortic dilatation noted.  14. There is mild dilatation of the aortic root.  15. TR signal is inadequate for assessing pulmonary artery systolic  pressure.  16. The inferior vena cava is normal in size with greater than 50%  respiratory variability, suggesting right atrial pressure of 3 mmHg.  17. Aortic valve mean gradient measures 8.0 mmHg.  18. The left  ventricle has no regional wall motion abnormalities.   Cardiac catheterization 01/01/2018:  Mid Cx lesion is 95% stenosed.  Prox LAD lesion is 25% stenosed.  Mid LAD lesion is 30% stenosed.  Prox RCA to Mid RCA lesion is 50% stenosed.  The left ventricular systolic function is normal.  LV end diastolic pressure is normal. LVEDP 12 mm Hg.  The left ventricular ejection fraction is 50-55% by visual estimate.  There is no aortic valve stenosis.  Severe tortuosity in the right subclavian which makes torquing catheters difficult. Would consider femoral approach in the future, particularly if emergent cardiac cath was needed.  A drug-eluting stent was successfully placed using a STENT SIERRA 3.25 X 15 MM.  Post intervention, there is a 0% residual stenosis.  Recommend uninterrupted dual antiplatelet therapy with Aspirin 81mg  daily and Ticagrelor 90mg  twice daily for a minimum of 12 months (ACS - Class I recommendation).  Continue aggressive secondary prevention.  IV Cangrelor for 2 hours. Cangrelor  was used in the event there was any bleeding issue in the forearm, it could be turned off and would wear off quickly.  Addendum: The patient is a candidate for the Xience short DAPT study due to his age. If he chooses to participate in the study and his bleeding risk is considered higher than his ischemic risk, I think it would be reasonable to shorten his DAPT duration to 30 days.   Assessment and Plan:  1.  CAD status post DES to the circumflex in October 2019.  He continues to do well without active angina and has been enjoying a regular walking regimen.  Continue aspirin, Toprol-XL, Lotensin, and Lipitor.  2.  Paroxysmal atrial fibrillation, asymptomatic in terms of palpitations.  He is also on Cardizem CD and Eliquis for stroke prophylaxis.  Just had lab work with PCP.  He denies any bleeding problems.  3.  Heart murmur with sclerotic aortic valve, restricted motion of the left coronary cusp and calcification within the LVOT, but no stenosis.  Medication Adjustments/Labs and Tests Ordered: Current medicines are reviewed at length with the patient today.  Concerns regarding medicines are outlined above.   Tests Ordered: Orders Placed This Encounter  Procedures  . EKG 12-Lead    Medication Changes: No orders of the defined types were placed in this encounter.   Disposition:  Follow up 6 months in the [place] office.  Signed, Satira Sark, [name], [name] 08/05/2019 11:18 AM    [place] at [place], [place], [place] [address] Phone: [phone]; Fax: [phone]

## 2019-08-05 ENCOUNTER — Other Ambulatory Visit: Payer: Self-pay

## 2019-08-05 ENCOUNTER — Encounter: Payer: Self-pay | Admitting: Cardiology

## 2019-08-05 ENCOUNTER — Ambulatory Visit: Payer: Medicare HMO | Admitting: Cardiology

## 2019-08-05 ENCOUNTER — Ambulatory Visit (INDEPENDENT_AMBULATORY_CARE_PROVIDER_SITE_OTHER): Payer: Medicare HMO | Admitting: Cardiology

## 2019-08-05 VITALS — BP 130/64 | HR 62 | Ht 72.0 in | Wt 224.4 lb

## 2019-08-05 DIAGNOSIS — E1165 Type 2 diabetes mellitus with hyperglycemia: Secondary | ICD-10-CM | POA: Diagnosis not present

## 2019-08-05 DIAGNOSIS — I10 Essential (primary) hypertension: Secondary | ICD-10-CM | POA: Diagnosis not present

## 2019-08-05 DIAGNOSIS — I25119 Atherosclerotic heart disease of native coronary artery with unspecified angina pectoris: Secondary | ICD-10-CM | POA: Diagnosis not present

## 2019-08-05 DIAGNOSIS — D509 Iron deficiency anemia, unspecified: Secondary | ICD-10-CM | POA: Diagnosis not present

## 2019-08-05 DIAGNOSIS — I482 Chronic atrial fibrillation, unspecified: Secondary | ICD-10-CM | POA: Diagnosis not present

## 2019-08-05 DIAGNOSIS — E782 Mixed hyperlipidemia: Secondary | ICD-10-CM | POA: Diagnosis not present

## 2019-08-05 DIAGNOSIS — I480 Paroxysmal atrial fibrillation: Secondary | ICD-10-CM | POA: Diagnosis not present

## 2019-08-05 DIAGNOSIS — D529 Folate deficiency anemia, unspecified: Secondary | ICD-10-CM | POA: Diagnosis not present

## 2019-08-05 DIAGNOSIS — D519 Vitamin B12 deficiency anemia, unspecified: Secondary | ICD-10-CM | POA: Diagnosis not present

## 2019-08-05 DIAGNOSIS — K219 Gastro-esophageal reflux disease without esophagitis: Secondary | ICD-10-CM | POA: Diagnosis not present

## 2019-08-05 DIAGNOSIS — N182 Chronic kidney disease, stage 2 (mild): Secondary | ICD-10-CM | POA: Diagnosis not present

## 2019-08-05 DIAGNOSIS — D649 Anemia, unspecified: Secondary | ICD-10-CM | POA: Diagnosis not present

## 2019-08-05 DIAGNOSIS — I358 Other nonrheumatic aortic valve disorders: Secondary | ICD-10-CM | POA: Diagnosis not present

## 2019-08-05 DIAGNOSIS — E1122 Type 2 diabetes mellitus with diabetic chronic kidney disease: Secondary | ICD-10-CM | POA: Diagnosis not present

## 2019-08-05 NOTE — Patient Instructions (Addendum)
Medication Instructions:    Your physician recommends that you continue on your current medications as directed. Please refer to the Current Medication list given to you today.  Labwork:  NONE  Testing/Procedures:  NONE  Follow-Up:  Your physician recommends that you schedule a follow-up appointment in: 6 months (office)  Any Other Special Instructions Will Be Listed Below (If Applicable).  If you need a refill on your cardiac medications before your next appointment, please call your pharmacy. 

## 2019-08-11 DIAGNOSIS — Z6831 Body mass index (BMI) 31.0-31.9, adult: Secondary | ICD-10-CM | POA: Diagnosis not present

## 2019-08-11 DIAGNOSIS — I10 Essential (primary) hypertension: Secondary | ICD-10-CM | POA: Diagnosis not present

## 2019-08-11 DIAGNOSIS — M1000 Idiopathic gout, unspecified site: Secondary | ICD-10-CM | POA: Diagnosis not present

## 2019-08-11 DIAGNOSIS — I4891 Unspecified atrial fibrillation: Secondary | ICD-10-CM | POA: Diagnosis not present

## 2019-08-11 DIAGNOSIS — Z23 Encounter for immunization: Secondary | ICD-10-CM | POA: Diagnosis not present

## 2019-08-11 DIAGNOSIS — E1169 Type 2 diabetes mellitus with other specified complication: Secondary | ICD-10-CM | POA: Diagnosis not present

## 2019-08-11 DIAGNOSIS — E782 Mixed hyperlipidemia: Secondary | ICD-10-CM | POA: Diagnosis not present

## 2019-08-11 DIAGNOSIS — G2581 Restless legs syndrome: Secondary | ICD-10-CM | POA: Diagnosis not present

## 2019-08-14 DIAGNOSIS — C44301 Unspecified malignant neoplasm of skin of nose: Secondary | ICD-10-CM | POA: Diagnosis not present

## 2019-08-14 DIAGNOSIS — C44311 Basal cell carcinoma of skin of nose: Secondary | ICD-10-CM | POA: Diagnosis not present

## 2019-08-21 DIAGNOSIS — R69 Illness, unspecified: Secondary | ICD-10-CM | POA: Diagnosis not present

## 2019-08-25 DIAGNOSIS — D500 Iron deficiency anemia secondary to blood loss (chronic): Secondary | ICD-10-CM | POA: Diagnosis not present

## 2019-08-25 DIAGNOSIS — I482 Chronic atrial fibrillation, unspecified: Secondary | ICD-10-CM | POA: Diagnosis not present

## 2019-08-25 DIAGNOSIS — I129 Hypertensive chronic kidney disease with stage 1 through stage 4 chronic kidney disease, or unspecified chronic kidney disease: Secondary | ICD-10-CM | POA: Diagnosis not present

## 2019-08-25 DIAGNOSIS — E1122 Type 2 diabetes mellitus with diabetic chronic kidney disease: Secondary | ICD-10-CM | POA: Diagnosis not present

## 2019-08-25 DIAGNOSIS — N182 Chronic kidney disease, stage 2 (mild): Secondary | ICD-10-CM | POA: Diagnosis not present

## 2019-09-01 DIAGNOSIS — S233XXA Sprain of ligaments of thoracic spine, initial encounter: Secondary | ICD-10-CM | POA: Diagnosis not present

## 2019-09-01 DIAGNOSIS — M9901 Segmental and somatic dysfunction of cervical region: Secondary | ICD-10-CM | POA: Diagnosis not present

## 2019-09-01 DIAGNOSIS — S338XXA Sprain of other parts of lumbar spine and pelvis, initial encounter: Secondary | ICD-10-CM | POA: Diagnosis not present

## 2019-09-01 DIAGNOSIS — M9902 Segmental and somatic dysfunction of thoracic region: Secondary | ICD-10-CM | POA: Diagnosis not present

## 2019-09-01 DIAGNOSIS — S134XXA Sprain of ligaments of cervical spine, initial encounter: Secondary | ICD-10-CM | POA: Diagnosis not present

## 2019-09-01 DIAGNOSIS — M9903 Segmental and somatic dysfunction of lumbar region: Secondary | ICD-10-CM | POA: Diagnosis not present

## 2019-09-02 DIAGNOSIS — H401112 Primary open-angle glaucoma, right eye, moderate stage: Secondary | ICD-10-CM | POA: Diagnosis not present

## 2019-09-23 DIAGNOSIS — M9901 Segmental and somatic dysfunction of cervical region: Secondary | ICD-10-CM | POA: Diagnosis not present

## 2019-09-23 DIAGNOSIS — S233XXA Sprain of ligaments of thoracic spine, initial encounter: Secondary | ICD-10-CM | POA: Diagnosis not present

## 2019-09-23 DIAGNOSIS — M9902 Segmental and somatic dysfunction of thoracic region: Secondary | ICD-10-CM | POA: Diagnosis not present

## 2019-09-23 DIAGNOSIS — S338XXA Sprain of other parts of lumbar spine and pelvis, initial encounter: Secondary | ICD-10-CM | POA: Diagnosis not present

## 2019-09-23 DIAGNOSIS — M9903 Segmental and somatic dysfunction of lumbar region: Secondary | ICD-10-CM | POA: Diagnosis not present

## 2019-09-23 DIAGNOSIS — S134XXA Sprain of ligaments of cervical spine, initial encounter: Secondary | ICD-10-CM | POA: Diagnosis not present

## 2019-09-24 DIAGNOSIS — I482 Chronic atrial fibrillation, unspecified: Secondary | ICD-10-CM | POA: Diagnosis not present

## 2019-09-24 DIAGNOSIS — D500 Iron deficiency anemia secondary to blood loss (chronic): Secondary | ICD-10-CM | POA: Diagnosis not present

## 2019-09-24 DIAGNOSIS — E1122 Type 2 diabetes mellitus with diabetic chronic kidney disease: Secondary | ICD-10-CM | POA: Diagnosis not present

## 2019-09-24 DIAGNOSIS — N182 Chronic kidney disease, stage 2 (mild): Secondary | ICD-10-CM | POA: Diagnosis not present

## 2019-09-24 DIAGNOSIS — I129 Hypertensive chronic kidney disease with stage 1 through stage 4 chronic kidney disease, or unspecified chronic kidney disease: Secondary | ICD-10-CM | POA: Diagnosis not present

## 2019-10-23 DIAGNOSIS — M9902 Segmental and somatic dysfunction of thoracic region: Secondary | ICD-10-CM | POA: Diagnosis not present

## 2019-10-23 DIAGNOSIS — M9901 Segmental and somatic dysfunction of cervical region: Secondary | ICD-10-CM | POA: Diagnosis not present

## 2019-10-23 DIAGNOSIS — S233XXA Sprain of ligaments of thoracic spine, initial encounter: Secondary | ICD-10-CM | POA: Diagnosis not present

## 2019-10-23 DIAGNOSIS — S134XXA Sprain of ligaments of cervical spine, initial encounter: Secondary | ICD-10-CM | POA: Diagnosis not present

## 2019-10-23 DIAGNOSIS — S338XXA Sprain of other parts of lumbar spine and pelvis, initial encounter: Secondary | ICD-10-CM | POA: Diagnosis not present

## 2019-10-23 DIAGNOSIS — M9903 Segmental and somatic dysfunction of lumbar region: Secondary | ICD-10-CM | POA: Diagnosis not present

## 2019-10-24 DIAGNOSIS — N182 Chronic kidney disease, stage 2 (mild): Secondary | ICD-10-CM | POA: Diagnosis not present

## 2019-10-24 DIAGNOSIS — I482 Chronic atrial fibrillation, unspecified: Secondary | ICD-10-CM | POA: Diagnosis not present

## 2019-10-24 DIAGNOSIS — I129 Hypertensive chronic kidney disease with stage 1 through stage 4 chronic kidney disease, or unspecified chronic kidney disease: Secondary | ICD-10-CM | POA: Diagnosis not present

## 2019-10-24 DIAGNOSIS — E1122 Type 2 diabetes mellitus with diabetic chronic kidney disease: Secondary | ICD-10-CM | POA: Diagnosis not present

## 2019-11-12 DIAGNOSIS — M47816 Spondylosis without myelopathy or radiculopathy, lumbar region: Secondary | ICD-10-CM | POA: Diagnosis not present

## 2019-11-12 DIAGNOSIS — M9903 Segmental and somatic dysfunction of lumbar region: Secondary | ICD-10-CM | POA: Diagnosis not present

## 2019-11-14 DIAGNOSIS — M9903 Segmental and somatic dysfunction of lumbar region: Secondary | ICD-10-CM | POA: Diagnosis not present

## 2019-11-14 DIAGNOSIS — M47816 Spondylosis without myelopathy or radiculopathy, lumbar region: Secondary | ICD-10-CM | POA: Diagnosis not present

## 2019-11-17 DIAGNOSIS — M9903 Segmental and somatic dysfunction of lumbar region: Secondary | ICD-10-CM | POA: Diagnosis not present

## 2019-11-17 DIAGNOSIS — M47816 Spondylosis without myelopathy or radiculopathy, lumbar region: Secondary | ICD-10-CM | POA: Diagnosis not present

## 2019-11-24 DIAGNOSIS — M9903 Segmental and somatic dysfunction of lumbar region: Secondary | ICD-10-CM | POA: Diagnosis not present

## 2019-11-24 DIAGNOSIS — M47816 Spondylosis without myelopathy or radiculopathy, lumbar region: Secondary | ICD-10-CM | POA: Diagnosis not present

## 2019-11-25 DIAGNOSIS — N182 Chronic kidney disease, stage 2 (mild): Secondary | ICD-10-CM | POA: Diagnosis not present

## 2019-11-25 DIAGNOSIS — E1122 Type 2 diabetes mellitus with diabetic chronic kidney disease: Secondary | ICD-10-CM | POA: Diagnosis not present

## 2019-11-25 DIAGNOSIS — I482 Chronic atrial fibrillation, unspecified: Secondary | ICD-10-CM | POA: Diagnosis not present

## 2019-11-25 DIAGNOSIS — I129 Hypertensive chronic kidney disease with stage 1 through stage 4 chronic kidney disease, or unspecified chronic kidney disease: Secondary | ICD-10-CM | POA: Diagnosis not present

## 2019-12-02 DIAGNOSIS — M47816 Spondylosis without myelopathy or radiculopathy, lumbar region: Secondary | ICD-10-CM | POA: Diagnosis not present

## 2019-12-02 DIAGNOSIS — M9903 Segmental and somatic dysfunction of lumbar region: Secondary | ICD-10-CM | POA: Diagnosis not present

## 2019-12-15 DIAGNOSIS — M47816 Spondylosis without myelopathy or radiculopathy, lumbar region: Secondary | ICD-10-CM | POA: Diagnosis not present

## 2019-12-15 DIAGNOSIS — M9903 Segmental and somatic dysfunction of lumbar region: Secondary | ICD-10-CM | POA: Diagnosis not present

## 2019-12-22 DIAGNOSIS — C61 Malignant neoplasm of prostate: Secondary | ICD-10-CM | POA: Diagnosis not present

## 2019-12-25 DIAGNOSIS — I129 Hypertensive chronic kidney disease with stage 1 through stage 4 chronic kidney disease, or unspecified chronic kidney disease: Secondary | ICD-10-CM | POA: Diagnosis not present

## 2019-12-25 DIAGNOSIS — N182 Chronic kidney disease, stage 2 (mild): Secondary | ICD-10-CM | POA: Diagnosis not present

## 2019-12-25 DIAGNOSIS — E1122 Type 2 diabetes mellitus with diabetic chronic kidney disease: Secondary | ICD-10-CM | POA: Diagnosis not present

## 2019-12-25 DIAGNOSIS — I482 Chronic atrial fibrillation, unspecified: Secondary | ICD-10-CM | POA: Diagnosis not present

## 2019-12-31 DIAGNOSIS — R69 Illness, unspecified: Secondary | ICD-10-CM | POA: Diagnosis not present

## 2020-02-13 ENCOUNTER — Ambulatory Visit: Payer: Medicare HMO | Admitting: Cardiology

## 2020-02-13 ENCOUNTER — Encounter: Payer: Self-pay | Admitting: Cardiology

## 2020-02-13 VITALS — BP 132/60 | HR 50 | Ht 72.0 in | Wt 222.0 lb

## 2020-02-13 DIAGNOSIS — I480 Paroxysmal atrial fibrillation: Secondary | ICD-10-CM

## 2020-02-13 DIAGNOSIS — E782 Mixed hyperlipidemia: Secondary | ICD-10-CM

## 2020-02-13 DIAGNOSIS — I25119 Atherosclerotic heart disease of native coronary artery with unspecified angina pectoris: Secondary | ICD-10-CM | POA: Diagnosis not present

## 2020-02-13 NOTE — Progress Notes (Signed)
Cardiology Office Note  Date: 02/13/2020   ID: SENG LARCH, DOB June 25, 1940, MRN [id number]  PCP:  Caryl Bis, MD  Cardiologist:  Rozann Lesches, MD Electrophysiologist:  None   Chief Complaint  Patient presents with  . Cardiac follow-up    History of Present Illness: [name] [name] is a 79 y.o. male last seen in May.  He presents for a routine visit.  He tells me that he has been doing well, still walks about 2 miles, 6 days of the week with one of his cousins.  He reports NYHA class II dyspnea, no exertional chest pain or palpitations.  I reviewed his medications which are outlined below and stable from a cardiac perspective.  He does not report any bleeding problems on Eliquis and aspirin, continues to follow with Dr. [name] for routine lab work.  He anticipates a repeat check in January.  Past Medical History:  Diagnosis Date  . Anemia   . Arthritis   . CAD (coronary artery disease)    DES to circumflex 12/2017  . Gout   . Headache   . History of kidney stones   . Hypertension   . NSTEMI (non-ST elevated myocardial infarction) ([place]) 12/31/2017  . Paroxysmal atrial fibrillation (HCC)   . Pericardial effusion    Idiopathic, recurrent pericardial effusion s/p pericardial window.  . Prostate cancer ([place]) 2007   S/P Seed implants   . Supraventricular tachycardia ([place])   . Temporal arteritis [facility])     Past Surgical History:  Procedure Laterality Date  . CATARACT EXTRACTION W/PHACO Left 06/07/2015   Procedure: CATARACT EXTRACTION PHACO AND INTRAOCULAR LENS PLACEMENT LEFT EYE CDE=8.00;  Surgeon: Tonny Branch, MD;  Location: [facility];  Service: Ophthalmology;  Laterality: Left;  . CATARACT EXTRACTION W/PHACO Right 06/17/2015   Procedure: CATARACT EXTRACTION PHACO AND INTRAOCULAR LENS PLACEMENT RIGHT EYE CDE=11.09;  Surgeon: Tonny Branch, MD;  Location: [facility];  Service: Ophthalmology;  Laterality: Right;  . CORONARY ANGIOPLASTY WITH STENT PLACEMENT  01/01/2018  . CORONARY  STENT INTERVENTION N/A 01/01/2018   Procedure: CORONARY STENT INTERVENTION;  Surgeon: Jettie Booze, MD;  Location: [place] [facility];  Service: Cardiovascular;  Laterality: N/A;  . FRACTURE SURGERY    . INSERTION PROSTATE RADIATION SEED  2007  . KNEE ARTHROSCOPY Left   . LEFT HEART CATH AND CORONARY ANGIOGRAPHY N/A 01/01/2018   Procedure: LEFT HEART CATH AND CORONARY ANGIOGRAPHY;  Surgeon: Jettie Booze, MD;  Location: [place] [facility];  Service: Cardiovascular;  Laterality: N/A;  . ORIF TIBIA & FIBULA FRACTURES Left 2003   Distal tibial/fibula   . PERICARDIAL WINDOW  December 2008  . PERICARDIOCENTESIS  2007   hx/notes 10/19/2011    Current Outpatient Medications  Medication Sig Dispense Refill  . allopurinol (ZYLOPRIM) 300 MG tablet Take 450 mg by mouth daily.     Marland Kitchen apixaban (ELIQUIS) 5 MG TABS tablet Take 1 tablet (5 mg total) by mouth 2 (two) times daily. 60 tablet 6  . aspirin EC 81 MG tablet Take 1 tablet (81 mg total) by mouth daily. 90 tablet 3  . atorvastatin (LIPITOR) 80 MG tablet TAKE 1 TABLET BY MOUTH ONCE DAILY AT  6  PM 90 tablet 2  . benazepril (LOTENSIN) 40 MG tablet Take 1 tablet by mouth once daily 90 tablet 2  . diltiazem (CARDIZEM CD) 180 MG 24 hr capsule Take 1 capsule (180 mg total) by mouth daily. 30 capsule 1  . FERREX 150 150 MG  capsule Take 1 capsule by mouth daily.    . ferrous sulfate 325 (65 FE) MG tablet Take 325 mg by mouth 2 (two) times daily.    . hydrALAZINE (APRESOLINE) 100 MG tablet Take 100 mg by mouth 2 (two) times daily.    . hydrochlorothiazide (HYDRODIURIL) 12.5 MG tablet Take 12.5 mg by mouth daily.    Marland Kitchen latanoprost (XALATAN) 0.005 % ophthalmic solution Place 1 drop into both eyes daily.    . metoprolol succinate (TOPROL-XL) 50 MG 24 hr tablet Take 1 tablet (50 mg total) by mouth daily. Take with or immediately following a meal. 90 tablet 3  . Multiple Vitamin (MULTIVITAMIN) tablet Take 1 tablet by mouth daily.    . pantoprazole  (PROTONIX) 40 MG tablet TAKE 1 TABLET BY MOUTH ONCE DAILY PATIENT  NEEDS  APPOINTMENT  FOR  FURTHER  REFILLS 30 tablet 11  . timolol (TIMOPTIC) 0.5 % ophthalmic solution Place 1 drop into both eyes daily.    . vitamin C (ASCORBIC ACID) 500 MG tablet Take 500 mg by mouth 2 (two) times daily.      No current facility-administered medications for this visit.   Allergies:  Amiodarone   ROS: No syncope.  Chronic hearing loss.  Physical Exam: VS:  BP 132/60   Pulse (!) 50   Ht 6' (1.829 m)   Wt 222 lb (100.7 kg)   SpO2 98%   BMI 30.11 kg/m , BMI Body mass index is 30.11 kg/m.  Wt Readings from Last 3 Encounters:  02/13/20 222 lb (100.7 kg)  08/05/19 224 lb 6.4 oz (101.8 kg)  02/04/19 226 lb (102.5 kg)    General: Elderly male, appears comfortable at rest. HEENT: Conjunctiva and lids normal, wearing a mask. Neck: Supple, no elevated JVP or carotid bruits, no thyromegaly. Lungs: Clear to auscultation, nonlabored breathing at rest. Cardiac: Regular rate and rhythm, no S3, 2/6 systolic murmur, no pericardial rub. Extremities: No pitting edema.  ECG:  An ECG dated 08/05/2019 was personally reviewed today and demonstrated:  Sinus bradycardia with RSR' in lead V1 and V2.  Recent Labwork:  No interval lab work for review today.  Other Studies Reviewed Today:  Echocardiogram 03/05/2019: 1. Left ventricular ejection fraction, by visual estimation, is 70 to  75%. The left ventricle has hyperdynamic function. There is moderately to  severely increased left ventricular hypertrophy.  2. Left ventricular diastolic parameters are consistent with Grade I  diastolic dysfunction (impaired relaxation).  3. Global right ventricle has normal systolic function.The right  ventricular size is normal. No increase in right ventricular wall  thickness.  4. Left atrial size was severely dilated.  5. Right atrial size was normal.  6. Small pericardial effusion.  7. The pericardial effusion is  circumferential.  8. Mild to moderate mitral annular calcification.  9. The mitral valve is degenerative. Trivial mitral valve regurgitation.  10. The tricuspid valve is grossly normal. Tricuspid valve regurgitation  is trivial.  11. The aortic valve is tricuspid. Aortic valve regurgitation is not  visualized. There is restricted motion of the left coronary cusp and  associated moderate, nodular calcification in the LVOT. No definite aortic  stenosis.  12. The pulmonic valve was grossly normal. Pulmonic valve regurgitation is  trivial.  13. Aortic dilatation noted.  14. There is mild dilatation of the aortic root.  15. TR signal is inadequate for assessing pulmonary artery systolic  pressure.  16. The inferior vena cava is normal in size with greater than 50%  respiratory  variability, suggesting right atrial pressure of 3 mmHg.  17. Aortic valve mean gradient measures 8.0 mmHg.  18. The left ventricle has no regional wall motion abnormalities.   Cardiac catheterization 01/01/2018:  Mid Cx lesion is 95% stenosed.  Prox LAD lesion is 25% stenosed.  Mid LAD lesion is 30% stenosed.  Prox RCA to Mid RCA lesion is 50% stenosed.  The left ventricular systolic function is normal.  LV end diastolic pressure is normal. LVEDP 12 mm Hg.  The left ventricular ejection fraction is 50-55% by visual estimate.  There is no aortic valve stenosis.  Severe tortuosity in the right subclavian which makes torquing catheters difficult. Would consider femoral approach in the future, particularly if emergent cardiac cath was needed.  A drug-eluting stent was successfully placed using a STENT SIERRA 3.25 X 15 MM.  Post intervention, there is a 0% residual stenosis.  Assessment and Plan:  1.  Paroxysmal atrial fibrillation.  CHA2DS2-VASc score is 3.  He continues on Eliquis for stroke prophylaxis, no bleeding problems.  No significant palpitations on Cardizem CD and Toprol-XL.  Continue with  current regimen.  2.  CAD status post DES to the circumflex in October 2019.  No active angina with regular walking regimen.  Continue low-dose aspirin, Toprol-XL, Lotensin, and Lipitor.  3.  Aortic valve sclerosis, no change in cardiac murmur.  Medication Adjustments/Labs and Tests Ordered: Current medicines are reviewed at length with the patient today.  Concerns regarding medicines are outlined above.   Tests Ordered: No orders of the defined types were placed in this encounter.   Medication Changes: No orders of the defined types were placed in this encounter.   Disposition:  Follow up 6 months in the [place] office.  Signed, Satira Sark, MD, [facility] 02/13/2020 11:14 AM    [facility] [place] [facility] [place] [address], [place], [place] [address] Phone: [phone]; Fax: [phone]

## 2020-02-13 NOTE — Patient Instructions (Signed)

## 2020-02-16 DIAGNOSIS — H524 Presbyopia: Secondary | ICD-10-CM | POA: Diagnosis not present

## 2020-02-16 DIAGNOSIS — H401121 Primary open-angle glaucoma, left eye, mild stage: Secondary | ICD-10-CM | POA: Diagnosis not present

## 2020-02-16 DIAGNOSIS — H04123 Dry eye syndrome of bilateral lacrimal glands: Secondary | ICD-10-CM | POA: Diagnosis not present

## 2020-02-16 DIAGNOSIS — Z9842 Cataract extraction status, left eye: Secondary | ICD-10-CM | POA: Diagnosis not present

## 2020-02-16 DIAGNOSIS — Z9841 Cataract extraction status, right eye: Secondary | ICD-10-CM | POA: Diagnosis not present

## 2020-02-16 DIAGNOSIS — H16223 Keratoconjunctivitis sicca, not specified as Sjogren's, bilateral: Secondary | ICD-10-CM | POA: Diagnosis not present

## 2020-02-16 DIAGNOSIS — H401112 Primary open-angle glaucoma, right eye, moderate stage: Secondary | ICD-10-CM | POA: Diagnosis not present

## 2020-02-16 DIAGNOSIS — Z961 Presence of intraocular lens: Secondary | ICD-10-CM | POA: Diagnosis not present

## 2020-02-27 ENCOUNTER — Other Ambulatory Visit: Payer: Self-pay | Admitting: Cardiology

## 2020-03-15 ENCOUNTER — Telehealth: Payer: Self-pay | Admitting: Cardiology

## 2020-03-15 ENCOUNTER — Encounter: Payer: Self-pay | Admitting: Cardiology

## 2020-03-15 ENCOUNTER — Ambulatory Visit (INDEPENDENT_AMBULATORY_CARE_PROVIDER_SITE_OTHER): Payer: Medicare HMO | Admitting: Cardiology

## 2020-03-15 VITALS — BP 118/62 | HR 52 | Ht 72.0 in | Wt 222.8 lb

## 2020-03-15 DIAGNOSIS — I480 Paroxysmal atrial fibrillation: Secondary | ICD-10-CM | POA: Diagnosis not present

## 2020-03-15 NOTE — Telephone Encounter (Signed)
New message    Patient had episode of heart being out of rhythm Saturday and his heart rate dropped to 45, he felt real weak and his bp was high , Sunday it was normal

## 2020-03-15 NOTE — Telephone Encounter (Signed)
Pt has appt with Dr Domenic Polite today at 1020 and confirmed

## 2020-03-15 NOTE — Progress Notes (Signed)
Cardiology Office Note  Date: 03/15/2020   ID: [name] [name], DOB March 21, 1941, MRN [id number]  PCP:  Caryl Bis, MD  Cardiologist:  Rozann Lesches, MD Electrophysiologist:  None   Chief Complaint  Patient presents with  . Cardiac follow-up    History of Present Illness: [name] [name] is a 79 y.o. male seen recently in mid November.  He called to schedule a follow-up visit reporting a limited episode of palpitations that occurred over the weekend.  He states that early Sunday morning he awoke with a sense of weakness and fast heartbeat.  States that his heart rate was in the 120s at that time.  After about 30 minutes his heart rate went back to normal.  He has had no recently documented atrial fibrillation in quite some time and has continued on Eliquis for stroke prophylaxis as well as combination of Cardizem CD and Toprol-XL.  He states that he walked about 2 miles on Friday.  He has been somewhat unsteady and reports some recent nosebleeds.  He plans to go see his PCP.  I personally reviewed his ECG today which shows sinus bradycardia.  Past Medical History:  Diagnosis Date  . Anemia   . Arthritis   . CAD (coronary artery disease)    DES to circumflex 12/2017  . Gout   . Headache   . History of kidney stones   . Hypertension   . NSTEMI (non-ST elevated myocardial infarction) ([place]) 12/31/2017  . Paroxysmal atrial fibrillation (HCC)   . Pericardial effusion    Idiopathic, recurrent pericardial effusion s/p pericardial window.  . Prostate cancer ([place]) 2007   S/P Seed implants   . Supraventricular tachycardia ([place])   . Temporal arteritis [facility])     Past Surgical History:  Procedure Laterality Date  . CATARACT EXTRACTION W/PHACO Left 06/07/2015   Procedure: CATARACT EXTRACTION PHACO AND INTRAOCULAR LENS PLACEMENT LEFT EYE CDE=8.00;  Surgeon: Tonny Branch, MD;  Location: [facility];  Service: Ophthalmology;  Laterality: Left;  . CATARACT EXTRACTION W/PHACO Right 06/17/2015    Procedure: CATARACT EXTRACTION PHACO AND INTRAOCULAR LENS PLACEMENT RIGHT EYE CDE=11.09;  Surgeon: Tonny Branch, MD;  Location: [facility];  Service: Ophthalmology;  Laterality: Right;  . CORONARY ANGIOPLASTY WITH STENT PLACEMENT  01/01/2018  . CORONARY STENT INTERVENTION N/A 01/01/2018   Procedure: CORONARY STENT INTERVENTION;  Surgeon: Jettie Booze, MD;  Location: [place] CV LAB;  Service: Cardiovascular;  Laterality: N/A;  . FRACTURE SURGERY    . INSERTION PROSTATE RADIATION SEED  2007  . KNEE ARTHROSCOPY Left   . LEFT HEART CATH AND CORONARY ANGIOGRAPHY N/A 01/01/2018   Procedure: LEFT HEART CATH AND CORONARY ANGIOGRAPHY;  Surgeon: Jettie Booze, MD;  Location: [place] CV LAB;  Service: Cardiovascular;  Laterality: N/A;  . ORIF TIBIA & FIBULA FRACTURES Left 2003   Distal tibial/fibula   . PERICARDIAL WINDOW  December 2008  . PERICARDIOCENTESIS  2007   hx/notes 10/19/2011    Current Outpatient Medications  Medication Sig Dispense Refill  . allopurinol (ZYLOPRIM) 300 MG tablet Take 450 mg by mouth daily.     Marland Kitchen apixaban (ELIQUIS) 5 MG TABS tablet Take 1 tablet (5 mg total) by mouth 2 (two) times daily. 60 tablet 6  . aspirin EC 81 MG tablet Take 1 tablet (81 mg total) by mouth daily. 90 tablet 3  . atorvastatin (LIPITOR) 80 MG tablet TAKE 1 TABLET BY MOUTH ONCE DAILY AT  6  PM 90 tablet 2  .  benazepril (LOTENSIN) 40 MG tablet Take 1 tablet by mouth once daily 90 tablet 2  . diltiazem (CARDIZEM CD) 180 MG 24 hr capsule Take 1 capsule (180 mg total) by mouth daily. 30 capsule 1  . FERREX 150 150 MG capsule Take 1 capsule by mouth daily.    . ferrous sulfate 325 (65 FE) MG tablet Take 325 mg by mouth 2 (two) times daily.    . hydrALAZINE (APRESOLINE) 100 MG tablet Take 100 mg by mouth 2 (two) times daily.    . hydrochlorothiazide (HYDRODIURIL) 12.5 MG tablet Take 12.5 mg by mouth daily.    Marland Kitchen latanoprost (XALATAN) 0.005 % ophthalmic solution Place 1 drop into both eyes daily.     . metoprolol succinate (TOPROL-XL) 50 MG 24 hr tablet Take 1 tablet (50 mg total) by mouth daily. Take with or immediately following a meal. 90 tablet 3  . Multiple Vitamin (MULTIVITAMIN) tablet Take 1 tablet by mouth daily.    . pantoprazole (PROTONIX) 40 MG tablet TAKE 1 TABLET BY MOUTH ONCE DAILY. PATIENT NEEDS APPOINTMENT FOR FURTHER REFILLS 90 tablet 1  . timolol (TIMOPTIC) 0.5 % ophthalmic solution Place 1 drop into both eyes daily.    . vitamin C (ASCORBIC ACID) 500 MG tablet Take 500 mg by mouth 2 (two) times daily.      No current facility-administered medications for this visit.   Allergies:  Amiodarone   ROS: No chest pain or syncope.  Physical Exam: VS:  BP 118/62   Pulse (!) 52   Ht 6' (1.829 m)   Wt 222 lb 12.8 oz (101.1 kg)   SpO2 96%   BMI 30.22 kg/m , BMI Body mass index is 30.22 kg/m.  Wt Readings from Last 3 Encounters:  03/15/20 222 lb 12.8 oz (101.1 kg)  02/13/20 222 lb (100.7 kg)  08/05/19 224 lb 6.4 oz (101.8 kg)    General: Elderly male, appears comfortable at rest. HEENT: Conjunctiva and lids normal, wearing a mask. Neck: Supple, no elevated JVP or carotid bruits, no thyromegaly. Lungs: Clear to auscultation, nonlabored breathing at rest. Cardiac: Regular rate and rhythm, no S3, 2/6 systolic murmur, no pericardial rub. Extremities: No pitting edema.  ECG:  An ECG dated 08/05/2019 was personally reviewed today and demonstrated:  Sinus bradycardia with RSR' in lead V1 and V2.  Recent Labwork:  No interval lab work for review today.  Other Studies Reviewed Today:  Echocardiogram12/11/2018: 1. Left ventricular ejection fraction, by visual estimation, is 70 to  75%. The left ventricle has hyperdynamic function. There is moderately to  severely increased left ventricular hypertrophy.  2. Left ventricular diastolic parameters are consistent with Grade I  diastolic dysfunction (impaired relaxation).  3. Global right ventricle has normal systolic  function.The right  ventricular size is normal. No increase in right ventricular wall  thickness.  4. Left atrial size was severely dilated.  5. Right atrial size was normal.  6. Small pericardial effusion.  7. The pericardial effusion is circumferential.  8. Mild to moderate mitral annular calcification.  9. The mitral valve is degenerative. Trivial mitral valve regurgitation.  10. The tricuspid valve is grossly normal. Tricuspid valve regurgitation  is trivial.  11. The aortic valve is tricuspid. Aortic valve regurgitation is not  visualized. There is restricted motion of the left coronary cusp and  associated moderate, nodular calcification in the LVOT. No definite aortic  stenosis.  12. The pulmonic valve was grossly normal. Pulmonic valve regurgitation is  trivial.  13. Aortic dilatation  noted.  14. There is mild dilatation of the aortic root.  15. TR signal is inadequate for assessing pulmonary artery systolic  pressure.  16. The inferior vena cava is normal in size with greater than 50%  respiratory variability, suggesting right atrial pressure of 3 mmHg.  17. Aortic valve mean gradient measures 8.0 mmHg.  18. The left ventricle has no regional wall motion abnormalities.   Cardiac catheterization 01/01/2018:  Mid Cx lesion is 95% stenosed.  Prox LAD lesion is 25% stenosed.  Mid LAD lesion is 30% stenosed.  Prox RCA to Mid RCA lesion is 50% stenosed.  The left ventricular systolic function is normal.  LV end diastolic pressure is normal. LVEDP 12 mm Hg.  The left ventricular ejection fraction is 50-55% by visual estimate.  There is no aortic valve stenosis.  Severe tortuosity in the right subclavian which makes torquing catheters difficult. Would consider femoral approach in the future, particularly if emergent cardiac cath was needed.  A drug-eluting stent was successfully placed using a STENT SIERRA 3.25 X 15 MM.  Post intervention, there is a 0% residual  stenosis.  Assessment and Plan:  1.  Status post episode of suspected breakthrough atrial fibrillation based on symptom description.  CHA2DS2-VASc score is 3.  He is already on Eliquis and states that he has been compliant with medication.  Also on Cardizem CD and Toprol-XL, in sinus bradycardia today at follow-up.  He has a history of rash on amiodarone.  For now would continue with observation, if this becomes a recurring pattern, may need to consider alternate antiarrhythmic therapy.  2.  Unsteadiness, no focal motor weakness.  No syncope.  I did talk with him about the possibility that he had a CNS event if in fact he did have an episode of breakthrough atrial fibrillation, risk would however be lower since he is already on Eliquis.  I offered to get him set up for a brain MRI.  He states that he would like to see his PCP first and review the situation.  Nursing is contacting [facility] to arrange a visit.  Medication Adjustments/Labs and Tests Ordered: Current medicines are reviewed at length with the patient today.  Concerns regarding medicines are outlined above.   Tests Ordered: Orders Placed This Encounter  Procedures  . EKG 12-Lead    Medication Changes: No orders of the defined types were placed in this encounter.   Disposition:  Follow up 6 months in the [place] office.  Signed, Satira Sark, MD, [facility] 03/15/2020 10:41 AM    [place] at [place], [place], [place] [address] Phone: [phone]; Fax: [phone]

## 2020-03-15 NOTE — Patient Instructions (Addendum)
Medication Instructions:   Your physician recommends that you continue on your current medications as directed. Please refer to the Current Medication list given to you today.  Labwork:  None  Testing/Procedures:  None  Follow-Up:  Your physician recommends that you schedule a follow-up appointment in: as planned  Any Other Special Instructions Will Be Listed Below (If Applicable).  If you need a refill on your cardiac medications before your next appointment, please call your pharmacy.

## 2020-03-18 DIAGNOSIS — Z6830 Body mass index (BMI) 30.0-30.9, adult: Secondary | ICD-10-CM | POA: Diagnosis not present

## 2020-03-18 DIAGNOSIS — R270 Ataxia, unspecified: Secondary | ICD-10-CM | POA: Diagnosis not present

## 2020-03-24 DIAGNOSIS — J0120 Acute ethmoidal sinusitis, unspecified: Secondary | ICD-10-CM | POA: Diagnosis not present

## 2020-03-24 DIAGNOSIS — I6782 Cerebral ischemia: Secondary | ICD-10-CM | POA: Diagnosis not present

## 2020-03-24 DIAGNOSIS — R270 Ataxia, unspecified: Secondary | ICD-10-CM | POA: Diagnosis not present

## 2020-03-24 DIAGNOSIS — Q048 Other specified congenital malformations of brain: Secondary | ICD-10-CM | POA: Diagnosis not present

## 2020-03-24 DIAGNOSIS — G319 Degenerative disease of nervous system, unspecified: Secondary | ICD-10-CM | POA: Diagnosis not present

## 2020-03-25 ENCOUNTER — Other Ambulatory Visit: Payer: Self-pay | Admitting: Cardiology

## 2020-04-01 DIAGNOSIS — D649 Anemia, unspecified: Secondary | ICD-10-CM | POA: Diagnosis not present

## 2020-04-01 DIAGNOSIS — E1122 Type 2 diabetes mellitus with diabetic chronic kidney disease: Secondary | ICD-10-CM | POA: Diagnosis not present

## 2020-04-01 DIAGNOSIS — E782 Mixed hyperlipidemia: Secondary | ICD-10-CM | POA: Diagnosis not present

## 2020-04-01 DIAGNOSIS — R946 Abnormal results of thyroid function studies: Secondary | ICD-10-CM | POA: Diagnosis not present

## 2020-04-01 DIAGNOSIS — E1165 Type 2 diabetes mellitus with hyperglycemia: Secondary | ICD-10-CM | POA: Diagnosis not present

## 2020-04-01 DIAGNOSIS — D529 Folate deficiency anemia, unspecified: Secondary | ICD-10-CM | POA: Diagnosis not present

## 2020-04-01 DIAGNOSIS — Z7689 Persons encountering health services in other specified circumstances: Secondary | ICD-10-CM | POA: Diagnosis not present

## 2020-04-01 DIAGNOSIS — D519 Vitamin B12 deficiency anemia, unspecified: Secondary | ICD-10-CM | POA: Diagnosis not present

## 2020-04-05 DIAGNOSIS — I10 Essential (primary) hypertension: Secondary | ICD-10-CM | POA: Diagnosis not present

## 2020-04-05 DIAGNOSIS — E7849 Other hyperlipidemia: Secondary | ICD-10-CM | POA: Diagnosis not present

## 2020-04-05 DIAGNOSIS — G2581 Restless legs syndrome: Secondary | ICD-10-CM | POA: Diagnosis not present

## 2020-04-05 DIAGNOSIS — Z6831 Body mass index (BMI) 31.0-31.9, adult: Secondary | ICD-10-CM | POA: Diagnosis not present

## 2020-04-05 DIAGNOSIS — M1000 Idiopathic gout, unspecified site: Secondary | ICD-10-CM | POA: Diagnosis not present

## 2020-04-05 DIAGNOSIS — I4891 Unspecified atrial fibrillation: Secondary | ICD-10-CM | POA: Diagnosis not present

## 2020-04-05 DIAGNOSIS — E1169 Type 2 diabetes mellitus with other specified complication: Secondary | ICD-10-CM | POA: Diagnosis not present

## 2020-04-05 DIAGNOSIS — Z0001 Encounter for general adult medical examination with abnormal findings: Secondary | ICD-10-CM | POA: Diagnosis not present

## 2020-05-17 DIAGNOSIS — Z6830 Body mass index (BMI) 30.0-30.9, adult: Secondary | ICD-10-CM | POA: Diagnosis not present

## 2020-05-17 DIAGNOSIS — R270 Ataxia, unspecified: Secondary | ICD-10-CM | POA: Diagnosis not present

## 2020-05-21 DIAGNOSIS — R270 Ataxia, unspecified: Secondary | ICD-10-CM | POA: Diagnosis not present

## 2020-05-21 DIAGNOSIS — D649 Anemia, unspecified: Secondary | ICD-10-CM | POA: Diagnosis not present

## 2020-05-21 DIAGNOSIS — Z6831 Body mass index (BMI) 31.0-31.9, adult: Secondary | ICD-10-CM | POA: Diagnosis not present

## 2020-05-24 DIAGNOSIS — E1122 Type 2 diabetes mellitus with diabetic chronic kidney disease: Secondary | ICD-10-CM | POA: Diagnosis not present

## 2020-05-24 DIAGNOSIS — D500 Iron deficiency anemia secondary to blood loss (chronic): Secondary | ICD-10-CM | POA: Diagnosis not present

## 2020-05-24 DIAGNOSIS — N182 Chronic kidney disease, stage 2 (mild): Secondary | ICD-10-CM | POA: Diagnosis not present

## 2020-05-24 DIAGNOSIS — I482 Chronic atrial fibrillation, unspecified: Secondary | ICD-10-CM | POA: Diagnosis not present

## 2020-05-24 DIAGNOSIS — I129 Hypertensive chronic kidney disease with stage 1 through stage 4 chronic kidney disease, or unspecified chronic kidney disease: Secondary | ICD-10-CM | POA: Diagnosis not present

## 2020-05-31 DIAGNOSIS — R262 Difficulty in walking, not elsewhere classified: Secondary | ICD-10-CM | POA: Diagnosis not present

## 2020-06-01 DIAGNOSIS — R270 Ataxia, unspecified: Secondary | ICD-10-CM | POA: Diagnosis not present

## 2020-06-01 DIAGNOSIS — R319 Hematuria, unspecified: Secondary | ICD-10-CM | POA: Diagnosis not present

## 2020-06-01 DIAGNOSIS — Z6830 Body mass index (BMI) 30.0-30.9, adult: Secondary | ICD-10-CM | POA: Diagnosis not present

## 2020-06-01 DIAGNOSIS — D509 Iron deficiency anemia, unspecified: Secondary | ICD-10-CM | POA: Diagnosis not present

## 2020-06-02 ENCOUNTER — Encounter (INDEPENDENT_AMBULATORY_CARE_PROVIDER_SITE_OTHER): Payer: Self-pay | Admitting: *Deleted

## 2020-06-08 DIAGNOSIS — R262 Difficulty in walking, not elsewhere classified: Secondary | ICD-10-CM | POA: Diagnosis not present

## 2020-06-10 ENCOUNTER — Encounter (INDEPENDENT_AMBULATORY_CARE_PROVIDER_SITE_OTHER): Payer: Self-pay | Admitting: *Deleted

## 2020-06-10 DIAGNOSIS — R262 Difficulty in walking, not elsewhere classified: Secondary | ICD-10-CM | POA: Diagnosis not present

## 2020-06-21 DIAGNOSIS — R262 Difficulty in walking, not elsewhere classified: Secondary | ICD-10-CM | POA: Diagnosis not present

## 2020-06-23 DIAGNOSIS — R262 Difficulty in walking, not elsewhere classified: Secondary | ICD-10-CM | POA: Diagnosis not present

## 2020-06-28 DIAGNOSIS — R262 Difficulty in walking, not elsewhere classified: Secondary | ICD-10-CM | POA: Diagnosis not present

## 2020-06-30 DIAGNOSIS — R262 Difficulty in walking, not elsewhere classified: Secondary | ICD-10-CM | POA: Diagnosis not present

## 2020-07-06 DIAGNOSIS — R262 Difficulty in walking, not elsewhere classified: Secondary | ICD-10-CM | POA: Diagnosis not present

## 2020-07-24 DIAGNOSIS — N182 Chronic kidney disease, stage 2 (mild): Secondary | ICD-10-CM | POA: Diagnosis not present

## 2020-07-24 DIAGNOSIS — D500 Iron deficiency anemia secondary to blood loss (chronic): Secondary | ICD-10-CM | POA: Diagnosis not present

## 2020-07-24 DIAGNOSIS — I482 Chronic atrial fibrillation, unspecified: Secondary | ICD-10-CM | POA: Diagnosis not present

## 2020-07-24 DIAGNOSIS — E1122 Type 2 diabetes mellitus with diabetic chronic kidney disease: Secondary | ICD-10-CM | POA: Diagnosis not present

## 2020-07-24 DIAGNOSIS — I129 Hypertensive chronic kidney disease with stage 1 through stage 4 chronic kidney disease, or unspecified chronic kidney disease: Secondary | ICD-10-CM | POA: Diagnosis not present

## 2020-07-30 DIAGNOSIS — D509 Iron deficiency anemia, unspecified: Secondary | ICD-10-CM | POA: Diagnosis not present

## 2020-07-30 DIAGNOSIS — E1122 Type 2 diabetes mellitus with diabetic chronic kidney disease: Secondary | ICD-10-CM | POA: Diagnosis not present

## 2020-07-30 DIAGNOSIS — D649 Anemia, unspecified: Secondary | ICD-10-CM | POA: Diagnosis not present

## 2020-07-30 DIAGNOSIS — E782 Mixed hyperlipidemia: Secondary | ICD-10-CM | POA: Diagnosis not present

## 2020-07-30 DIAGNOSIS — E1169 Type 2 diabetes mellitus with other specified complication: Secondary | ICD-10-CM | POA: Diagnosis not present

## 2020-07-30 DIAGNOSIS — D519 Vitamin B12 deficiency anemia, unspecified: Secondary | ICD-10-CM | POA: Diagnosis not present

## 2020-07-30 DIAGNOSIS — E7849 Other hyperlipidemia: Secondary | ICD-10-CM | POA: Diagnosis not present

## 2020-07-30 DIAGNOSIS — E1165 Type 2 diabetes mellitus with hyperglycemia: Secondary | ICD-10-CM | POA: Diagnosis not present

## 2020-08-05 DIAGNOSIS — E7849 Other hyperlipidemia: Secondary | ICD-10-CM | POA: Diagnosis not present

## 2020-08-05 DIAGNOSIS — I4891 Unspecified atrial fibrillation: Secondary | ICD-10-CM | POA: Diagnosis not present

## 2020-08-05 DIAGNOSIS — D509 Iron deficiency anemia, unspecified: Secondary | ICD-10-CM | POA: Diagnosis not present

## 2020-08-05 DIAGNOSIS — D692 Other nonthrombocytopenic purpura: Secondary | ICD-10-CM | POA: Diagnosis not present

## 2020-08-05 DIAGNOSIS — M1000 Idiopathic gout, unspecified site: Secondary | ICD-10-CM | POA: Diagnosis not present

## 2020-08-05 DIAGNOSIS — G2581 Restless legs syndrome: Secondary | ICD-10-CM | POA: Diagnosis not present

## 2020-08-05 DIAGNOSIS — Z7189 Other specified counseling: Secondary | ICD-10-CM | POA: Diagnosis not present

## 2020-08-05 DIAGNOSIS — I10 Essential (primary) hypertension: Secondary | ICD-10-CM | POA: Diagnosis not present

## 2020-08-10 ENCOUNTER — Ambulatory Visit (INDEPENDENT_AMBULATORY_CARE_PROVIDER_SITE_OTHER): Payer: Medicare HMO | Admitting: Gastroenterology

## 2020-08-11 NOTE — Progress Notes (Signed)
Cardiology Office Note  Date: 08/12/2020   ID: [name] [name], DOB 1941-01-17, MRN [id number]  PCP:  Caryl Bis, MD  Cardiologist:  Rozann Lesches, MD Electrophysiologist:  None   Chief Complaint  Patient presents with  . Cardiac follow-up    History of Present Illness: [name] [name] is an 80 y.o. male last seen in December 2021.  He presents for a routine visit.  Reports an episode of breakthrough palpitations it was limited approximately 1 month ago.  Fortunately, he has not had progressive symptoms.  He continues to have trouble with his balance, using a cane, also undergoing physical therapy.  Despite this, he continues to walk for exercise, did 2 miles this morning.  He does not report any angina.  I reviewed his medications which are listed below.  He tells me that he is in the process of work-up for anemia and may need a colonoscopy. We are requesting his most recent lab work from Dr. [name].   Past Medical History:  Diagnosis Date  . Anemia   . Arthritis   . CAD (coronary artery disease)    DES to circumflex 12/2017  . Gout   . Headache   . History of kidney stones   . Hypertension   . NSTEMI (non-ST elevated myocardial infarction) ([place]) 12/31/2017  . Paroxysmal atrial fibrillation (HCC)   . Pericardial effusion    Idiopathic, recurrent pericardial effusion s/p pericardial window.  . Prostate cancer ([place]) 2007   S/P Seed implants   . Supraventricular tachycardia ([place])   . Temporal arteritis [facility])     Past Surgical History:  Procedure Laterality Date  . CATARACT EXTRACTION W/PHACO Left 06/07/2015   Procedure: CATARACT EXTRACTION PHACO AND INTRAOCULAR LENS PLACEMENT LEFT EYE CDE=8.00;  Surgeon: Tonny Branch, MD;  Location: [facility];  Service: Ophthalmology;  Laterality: Left;  . CATARACT EXTRACTION W/PHACO Right 06/17/2015   Procedure: CATARACT EXTRACTION PHACO AND INTRAOCULAR LENS PLACEMENT RIGHT EYE CDE=11.09;  Surgeon: Tonny Branch, MD;  Location: [facility];   Service: Ophthalmology;  Laterality: Right;  . CORONARY ANGIOPLASTY WITH STENT PLACEMENT  01/01/2018  . CORONARY STENT INTERVENTION N/A 01/01/2018   Procedure: CORONARY STENT INTERVENTION;  Surgeon: Jettie Booze, MD;  Location: [place] [facility];  Service: Cardiovascular;  Laterality: N/A;  . FRACTURE SURGERY    . INSERTION PROSTATE RADIATION SEED  2007  . KNEE ARTHROSCOPY Left   . LEFT HEART CATH AND CORONARY ANGIOGRAPHY N/A 01/01/2018   Procedure: LEFT HEART CATH AND CORONARY ANGIOGRAPHY;  Surgeon: Jettie Booze, MD;  Location: [place] [facility];  Service: Cardiovascular;  Laterality: N/A;  . ORIF TIBIA & FIBULA FRACTURES Left 2003   Distal tibial/fibula   . PERICARDIAL WINDOW  December 2008  . PERICARDIOCENTESIS  2007   hx/notes 10/19/2011    Current Outpatient Medications  Medication Sig Dispense Refill  . allopurinol (ZYLOPRIM) 300 MG tablet Take 450 mg by mouth daily.     Marland Kitchen apixaban (ELIQUIS) 5 MG TABS tablet Take 1 tablet (5 mg total) by mouth 2 (two) times daily. 60 tablet 6  . atorvastatin (LIPITOR) 80 MG tablet TAKE 1 TABLET BY MOUTH ONCE DAILY AT  6  PM 90 tablet 3  . benazepril (LOTENSIN) 40 MG tablet Take 1 tablet by mouth once daily 90 tablet 3  . diltiazem (CARDIZEM CD) 180 MG 24 hr capsule Take 1 capsule (180 mg total) by mouth daily. 30 capsule 1  . FERREX 150 150 MG capsule Take  1 capsule by mouth daily.    . ferrous sulfate 325 (65 FE) MG tablet Take 325 mg by mouth 2 (two) times daily.    . hydrALAZINE (APRESOLINE) 100 MG tablet Take 100 mg by mouth 2 (two) times daily.    . hydrochlorothiazide (HYDRODIURIL) 12.5 MG tablet Take 12.5 mg by mouth daily.    Marland Kitchen latanoprost (XALATAN) 0.005 % ophthalmic solution Place 1 drop into both eyes daily.    . metoprolol succinate (TOPROL-XL) 50 MG 24 hr tablet Take 1 tablet (50 mg total) by mouth daily. Take with or immediately following a meal. 90 tablet 3  . Multiple Vitamin (MULTIVITAMIN) tablet Take 1 tablet by  mouth daily.    . pantoprazole (PROTONIX) 40 MG tablet TAKE 1 TABLET BY MOUTH ONCE DAILY. PATIENT NEEDS APPOINTMENT FOR FURTHER REFILLS 90 tablet 1  . timolol (TIMOPTIC) 0.5 % ophthalmic solution Place 1 drop into both eyes daily.    . vitamin C (ASCORBIC ACID) 500 MG tablet Take 500 mg by mouth 2 (two) times daily.      No current facility-administered medications for this visit.   Allergies:  Amiodarone   ROS: Hearing loss.  Physical Exam: VS:  BP (!) 110/50   Pulse 70   Ht 6' (1.829 m)   Wt 224 lb (101.6 kg)   SpO2 94%   BMI 30.38 kg/m , BMI Body mass index is 30.38 kg/m.  Wt Readings from Last 3 Encounters:  08/12/20 224 lb (101.6 kg)  03/15/20 222 lb 12.8 oz (101.1 kg)  02/13/20 222 lb (100.7 kg)    General: Elderly male, appears comfortable at rest. HEENT: Conjunctiva and lids normal, wearing a mask. Neck: Supple, no elevated JVP or carotid bruits, no thyromegaly. Lungs: Clear to auscultation, nonlabored breathing at rest. Cardiac: Regular rate and rhythm, no S3, 2/6 systolic murmur, no pericardial rub. Extremities: No pitting edema.  ECG:  An ECG dated 03/15/2020 was personally reviewed today and demonstrated:  Sinus bradycardia.  Recent Labwork:  No interval lab work for review today.  Other Studies Reviewed Today:  Echocardiogram12/11/2018: 1. Left ventricular ejection fraction, by visual estimation, is 70 to  75%. The left ventricle has hyperdynamic function. There is moderately to  severely increased left ventricular hypertrophy.  2. Left ventricular diastolic parameters are consistent with Grade I  diastolic dysfunction (impaired relaxation).  3. Global right ventricle has normal systolic function.The right  ventricular size is normal. No increase in right ventricular wall  thickness.  4. Left atrial size was severely dilated.  5. Right atrial size was normal.  6. Small pericardial effusion.  7. The pericardial effusion is circumferential.  8.  Mild to moderate mitral annular calcification.  9. The mitral valve is degenerative. Trivial mitral valve regurgitation.  10. The tricuspid valve is grossly normal. Tricuspid valve regurgitation  is trivial.  11. The aortic valve is tricuspid. Aortic valve regurgitation is not  visualized. There is restricted motion of the left coronary cusp and  associated moderate, nodular calcification in the LVOT. No definite aortic  stenosis.  12. The pulmonic valve was grossly normal. Pulmonic valve regurgitation is  trivial.  13. Aortic dilatation noted.  14. There is mild dilatation of the aortic root.  15. TR signal is inadequate for assessing pulmonary artery systolic  pressure.  16. The inferior vena cava is normal in size with greater than 50%  respiratory variability, suggesting right atrial pressure of 3 mmHg.  17. Aortic valve mean gradient measures 8.0 mmHg.  18.  The left ventricle has no regional wall motion abnormalities.   Cardiac catheterization 01/01/2018:  Mid Cx lesion is 95% stenosed.  Prox LAD lesion is 25% stenosed.  Mid LAD lesion is 30% stenosed.  Prox RCA to Mid RCA lesion is 50% stenosed.  The left ventricular systolic function is normal.  LV end diastolic pressure is normal. LVEDP 12 mm Hg.  The left ventricular ejection fraction is 50-55% by visual estimate.  There is no aortic valve stenosis.  Severe tortuosity in the right subclavian which makes torquing catheters difficult. Would consider femoral approach in the future, particularly if emergent cardiac cath was needed.  A drug-eluting stent was successfully placed using a STENT SIERRA 3.25 X 15 MM.  Post intervention, there is a 0% residual stenosis.  Assessment and Plan:  1.  Paroxysmal atrial fibrillation with CHA2DS2-VASc score of 3.  Heart rate regular today.  Plan to continue present dose of Cardizem CD 180 mg daily and Toprol-XL 50 mg daily.  He is on Eliquis for stroke prophylaxis.  2.   Acquired thrombophilia, on Eliquis for stroke prophylaxis.  Requesting interval lab work from PCP.  My understanding is that he is in the process of evaluation of iron deficiency anemia and may need a colonoscopy.  Medication Adjustments/Labs and Tests Ordered: Current medicines are reviewed at length with the patient today.  Concerns regarding medicines are outlined above.   Tests Ordered: No orders of the defined types were placed in this encounter.   Medication Changes: No orders of the defined types were placed in this encounter.   Disposition:  Follow up 6 months.  Signed, Satira Sark, MD, [facility] 08/12/2020 11:13 AM    [facility] at [place], [place], [place] [address] Phone: [phone]; Fax: [phone]

## 2020-08-12 ENCOUNTER — Ambulatory Visit: Payer: Medicare HMO | Admitting: Cardiology

## 2020-08-12 ENCOUNTER — Encounter: Payer: Self-pay | Admitting: Cardiology

## 2020-08-12 ENCOUNTER — Encounter: Payer: Self-pay | Admitting: *Deleted

## 2020-08-12 VITALS — BP 110/50 | HR 70 | Ht 72.0 in | Wt 224.0 lb

## 2020-08-12 DIAGNOSIS — I480 Paroxysmal atrial fibrillation: Secondary | ICD-10-CM | POA: Diagnosis not present

## 2020-08-12 DIAGNOSIS — D6869 Other thrombophilia: Secondary | ICD-10-CM | POA: Diagnosis not present

## 2020-08-12 NOTE — Patient Instructions (Addendum)
Medication Instructions:   Your physician recommends that you continue on your current medications as directed. Please refer to the Current Medication list given to you today.  Labwork:  none  Testing/Procedures:  none  Follow-Up:  Your physician recommends that you schedule a follow-up appointment in: 6 months. You will receive a reminder call or letter in the mail in about 4 months reminding you to call and schedule your appointment. If you don't receive this letter, please contact our office.  Any Other Special Instructions Will Be Listed Below (If Applicable).  If you need a refill on your cardiac medications before your next appointment, please call your pharmacy.

## 2020-08-26 ENCOUNTER — Other Ambulatory Visit: Payer: Self-pay | Admitting: Cardiology

## 2020-09-09 DIAGNOSIS — H401112 Primary open-angle glaucoma, right eye, moderate stage: Secondary | ICD-10-CM | POA: Diagnosis not present

## 2020-09-20 ENCOUNTER — Encounter (INDEPENDENT_AMBULATORY_CARE_PROVIDER_SITE_OTHER): Payer: Self-pay | Admitting: Gastroenterology

## 2020-09-20 ENCOUNTER — Encounter (INDEPENDENT_AMBULATORY_CARE_PROVIDER_SITE_OTHER): Payer: Self-pay

## 2020-09-20 ENCOUNTER — Telehealth (INDEPENDENT_AMBULATORY_CARE_PROVIDER_SITE_OTHER): Payer: Self-pay

## 2020-09-20 ENCOUNTER — Other Ambulatory Visit: Payer: Self-pay

## 2020-09-20 ENCOUNTER — Ambulatory Visit (INDEPENDENT_AMBULATORY_CARE_PROVIDER_SITE_OTHER): Payer: Medicare HMO | Admitting: Gastroenterology

## 2020-09-20 ENCOUNTER — Telehealth: Payer: Self-pay | Admitting: *Deleted

## 2020-09-20 ENCOUNTER — Other Ambulatory Visit (INDEPENDENT_AMBULATORY_CARE_PROVIDER_SITE_OTHER): Payer: Self-pay

## 2020-09-20 DIAGNOSIS — D509 Iron deficiency anemia, unspecified: Secondary | ICD-10-CM | POA: Diagnosis not present

## 2020-09-20 MED ORDER — NA SULFATE-K SULFATE-MG SULF 17.5-3.13-1.6 GM/177ML PO SOLN: 354.0000 mL | Freq: Once | ORAL | 0 refills | Status: AC

## 2020-09-20 NOTE — H&P (View-Only) (Signed)
[name] [name], M.D. Gastroenterology & Hepatology [facility] [address] [place], [place] [address] Primary Care Physician: Caryl Bis, MD [place] [address]  Referring MD: PCP  Chief Complaint: Iron deficiency anemia  History of Present Illness: COLTYN HANNING is a 80 y.o. male with PMH atrial fibrillation on Eliquis, coronary artery disease, gout, hypertension, prostate cancer status post seed implants, SVT, temporal arteritis, and history of H. pylori, who presents for evaluation of iron deficiency anemia.  Patient was told 3 years ago he had anemia and has been on oral iron since then. He has been taking oral iron BID 150 mg. Denies ever having any melena or hematochezia. The patient denies having any nausea, vomiting, fever, chills, hematochezia, melena, hematemesis, abdominal distention, abdominal pain, diarrhea, jaundice, pruritus or recent weight loss. No SOB, chest pain or lightheadedness/dizziness.  As part of the evaluation for his iron deficiency anemia, he was seen by the general surgery service at [facility].  He underwent a colonoscopy and an EGD in the past as described below (reports obtained from clinical notes but no official report/images are available).  The patient had his labs checked by his PCP.  I received the results of blood testing performed on 05/21/2020 which showed a hemoglobin of 8.7, MCV 91, white blood cell count 5.2, PLT 221.  Iron stores showed iron of 52, ferritin 15, iron saturation of 18%, vitamin B12 of 881 and folate of more than 20.  His wife states that the patient was advised to stop the aspirin 81 mg in March 2021 and a repeat Hb showed improvement of his Hb up to 11 level (no report available).  Last EGD:2/8 /2021 - Mild to moderate gastric antritis and mild duodenitis Polyp just distal to the GE junction that was biopsied. Pathology: Fundal gland polyp with no metaplasia or  dysplasia. CLO test positive for H. Pylori. Was given treatment but unclear which regimen.  Last Colonoscopy:2019 - normal, performed by Dr. Arlester Marker at [facility]  FHx: neg for any gastrointestinal/liver disease, father had cancer unknown type Social: neg smoking, alcohol or illicit drug use Surgical: no abdominal surgeries  Past Medical History: Past Medical History:  Diagnosis Date   Anemia    Arthritis    CAD (coronary artery disease)    DES to circumflex 12/2017   Gout    Headache    History of kidney stones    Hypertension    NSTEMI (non-ST elevated myocardial infarction) ([place]) 12/31/2017   Paroxysmal atrial fibrillation (HCC)    Pericardial effusion    Idiopathic, recurrent pericardial effusion s/p pericardial window.   Prostate cancer ([place]) 2007   S/P Seed implants    Supraventricular tachycardia ([place])    Temporal arteritis [facility])     Past Surgical History: Past Surgical History:  Procedure Laterality Date   CATARACT EXTRACTION W/PHACO Left 06/07/2015   Procedure: CATARACT EXTRACTION PHACO AND INTRAOCULAR LENS PLACEMENT LEFT EYE CDE=8.00;  Surgeon: Tonny Branch, MD;  Location: [facility];  Service: Ophthalmology;  Laterality: Left;   CATARACT EXTRACTION W/PHACO Right 06/17/2015   Procedure: CATARACT EXTRACTION PHACO AND INTRAOCULAR LENS PLACEMENT RIGHT EYE CDE=11.09;  Surgeon: Tonny Branch, MD;  Location: [facility];  Service: Ophthalmology;  Laterality: Right;   CORONARY ANGIOPLASTY WITH STENT PLACEMENT  01/01/2018   CORONARY STENT INTERVENTION N/A 01/01/2018   Procedure: CORONARY STENT INTERVENTION;  Surgeon: Jettie Booze, MD;  Location: [place] [facility];  Service: Cardiovascular;  Laterality: N/A;   FRACTURE SURGERY     INSERTION PROSTATE RADIATION SEED  2007   KNEE ARTHROSCOPY Left    LEFT HEART CATH AND CORONARY ANGIOGRAPHY N/A 01/01/2018   Procedure: LEFT HEART CATH AND CORONARY ANGIOGRAPHY;  Surgeon: Jettie Booze, MD;  Location: [place] [facility];   Service: Cardiovascular;  Laterality: N/A;   ORIF TIBIA & FIBULA FRACTURES Left 2003   Distal tibial/fibula    PERICARDIAL WINDOW  December 2008   PERICARDIOCENTESIS  2007   hx/notes 10/19/2011    Family History: Family History  Problem Relation Age of Onset   CAD Mother        MI in 16s   Prostate cancer Father    Prostate cancer Brother    Lung cancer Brother     Social History: Social History   Tobacco Use  Smoking Status Former   Packs/day: 0.30   Years: 10.00   Pack years: 3.00   Types: Cigarettes   Start date: 08/06/1958   Quit date: 1968   Years since quitting: 54.5  Smokeless Tobacco Never   Social History   Substance and Sexual Activity  Alcohol Use Not Currently   Social History   Substance and Sexual Activity  Drug Use Never    Allergies: Allergies  Allergen Reactions   Amiodarone     rash    Medications: Current Outpatient Medications  Medication Sig Dispense Refill   allopurinol (ZYLOPRIM) 300 MG tablet Take 450 mg by mouth daily.      apixaban (ELIQUIS) 5 MG TABS tablet Take 1 tablet (5 mg total) by mouth 2 (two) times daily. 60 tablet 6   atorvastatin (LIPITOR) 80 MG tablet TAKE 1 TABLET BY MOUTH ONCE DAILY AT  6  PM 90 tablet 3   benazepril (LOTENSIN) 40 MG tablet Take 1 tablet by mouth once daily 90 tablet 3   diltiazem (CARDIZEM CD) 180 MG 24 hr capsule Take 1 capsule (180 mg total) by mouth daily. 30 capsule 1   FERREX 150 150 MG capsule Take 1 capsule by mouth 2 (two) times daily.     hydrALAZINE (APRESOLINE) 100 MG tablet Take 100 mg by mouth 2 (two) times daily.     hydrochlorothiazide (HYDRODIURIL) 12.5 MG tablet Take 12.5 mg by mouth daily.     latanoprost (XALATAN) 0.005 % ophthalmic solution Place 1 drop into both eyes daily.     metoprolol succinate (TOPROL-XL) 50 MG 24 hr tablet Take 1 tablet (50 mg total) by mouth daily. Take with or immediately following a meal. 90 tablet 3   Multiple Vitamin (MULTIVITAMIN) tablet Take 1  tablet by mouth daily.     pantoprazole (PROTONIX) 40 MG tablet Take 1 tablet by mouth once daily 90 tablet 3   timolol (TIMOPTIC) 0.5 % ophthalmic solution Place 1 drop into both eyes daily.     vitamin C (ASCORBIC ACID) 500 MG tablet Take 500 mg by mouth 2 (two) times daily.      Na Sulfate-K Sulfate-Mg Sulf 17.5-3.13-1.6 GM/177ML SOLN Take 354 mLs by mouth once for 1 dose. 354 mL 0   No current facility-administered medications for this visit.    Review of Systems: GENERAL: negative for malaise, night sweats HEENT: No changes in hearing or vision, no nose bleeds or other nasal problems. NECK: Negative for lumps, goiter, pain and significant neck swelling RESPIRATORY: Negative for cough, wheezing CARDIOVASCULAR: Negative for chest pain, leg swelling, palpitations, orthopnea GI: SEE HPI MUSCULOSKELETAL: Negative for joint pain or  swelling, back pain, and muscle pain. SKIN: Negative for lesions, rash PSYCH: Negative for sleep disturbance, mood disorder and recent psychosocial stressors. HEMATOLOGY Negative for prolonged bleeding, bruising easily, and swollen nodes. ENDOCRINE: Negative for cold or heat intolerance, polyuria, polydipsia and goiter. NEURO: negative for tremor, gait imbalance, syncope and seizures. The remainder of the review of systems is noncontributory.   Physical Exam: BP 131/65 (BP Location: Right Arm, Patient Position: Sitting, Cuff Size: Large)   Pulse (!) 55   Temp 98.5 F (36.9 C) (Oral)   Ht 6' (1.829 m)   Wt 215 lb (97.5 kg)   BMI 29.16 kg/m  GENERAL: The patient is AO x3, in no acute distress. Elder. HEENT: Head is normocephalic and atraumatic. EOMI are intact. Mouth is well hydrated and without lesions. NECK: Supple. No masses LUNGS: Clear to auscultation. No presence of rhonchi/wheezing/rales. Adequate chest expansion HEART: RRR, normal s1 and s2. ABDOMEN: Soft, nontender, no guarding, no peritoneal signs, and nondistended. BS +. No  masses. EXTREMITIES: Without any cyanosis, clubbing, rash, lesions or edema. NEUROLOGIC: AOx3, no focal motor deficit. SKIN: no jaundice, no rashes   Imaging/Labs: as above  I personally reviewed and interpreted the available labs, imaging and endoscopic files.  Impression and Plan: CHESTON COURY is a 80 y.o. male with PMH atrial fibrillation on Eliquis, coronary artery disease, gout, hypertension, prostate cancer status post seed implants, SVT, temporal arteritis, and history of H. pylori, who presents for evaluation of iron deficiency anemia.  The patient has presented iron deficiency anemia of unclear etiology.  He has undergone previous EGD and colonoscopy at an outside facility without identification of a clear source for his anemia.  However, he had a recent drop in his hemoglobin without requirement of transfusion.  I explained to the patient that we will need to proceed with a push enteroscopy and colonoscopy.  For now he will need to continue taking his oral iron intake as it seems that he has helped improve his iron stores.  For the patient and the family member agreed understood.  - Schedule push enteroscopy and colonoscopy, will need to be off anticoagulation for 48 hours, will obtain clearance for this - Continue oral iron  All questions were answered.      [name] Peppers, MD Gastroenterology and Hepatology [facility]

## 2020-09-20 NOTE — Patient Instructions (Addendum)
Schedule EGD and colonoscopy ?Continue oral iron ?

## 2020-09-20 NOTE — Progress Notes (Signed)
[name] [name], M.D. Gastroenterology & Hepatology [facility] [address] [place], [place] [address] Primary Care Physician: Caryl Bis, MD [place] [address]  Referring MD: PCP  Chief Complaint: Iron deficiency anemia  History of Present Illness: [name] [name] is a 80 y.o. male with PMH atrial fibrillation on Eliquis, coronary artery disease, gout, hypertension, prostate cancer status post seed implants, SVT, temporal arteritis, and history of H. pylori, who presents for evaluation of iron deficiency anemia.  Patient was told 3 years ago he had anemia and has been on oral iron since then. He has been taking oral iron BID 150 mg. Denies ever having any melena or hematochezia. The patient denies having any nausea, vomiting, fever, chills, hematochezia, melena, hematemesis, abdominal distention, abdominal pain, diarrhea, jaundice, pruritus or recent weight loss. No SOB, chest pain or lightheadedness/dizziness.  As part of the evaluation for his iron deficiency anemia, he was seen by the general surgery service at [facility].  He underwent a colonoscopy and an EGD in the past as described below (reports obtained from clinical notes but no official report/images are available).  The patient had his labs checked by his PCP.  I received the results of blood testing performed on 05/21/2020 which showed a hemoglobin of 8.7, MCV 91, white blood cell count 5.2, PLT 221.  Iron stores showed iron of 52, ferritin 15, iron saturation of 18%, vitamin B12 of 881 and folate of more than 20.  His wife states that the patient was advised to stop the aspirin 81 mg in March 2021 and a repeat Hb showed improvement of his Hb up to 11 level (no report available).  Last EGD:2/8 /2021 - Mild to moderate gastric antritis and mild duodenitis Polyp just distal to the GE junction that was biopsied. Pathology: Fundal gland polyp with no metaplasia or  dysplasia. CLO test positive for H. Pylori. Was given treatment but unclear which regimen.  Last Colonoscopy:2019 - normal, performed by Dr. Arlester Marker at [facility]  FHx: neg for any gastrointestinal/liver disease, father had cancer unknown type Social: neg smoking, alcohol or illicit drug use Surgical: no abdominal surgeries  Past Medical History: Past Medical History:  Diagnosis Date   Anemia    Arthritis    CAD (coronary artery disease)    DES to circumflex 12/2017   Gout    Headache    History of kidney stones    Hypertension    NSTEMI (non-ST elevated myocardial infarction) ([place]) 12/31/2017   Paroxysmal atrial fibrillation (HCC)    Pericardial effusion    Idiopathic, recurrent pericardial effusion s/p pericardial window.   Prostate cancer ([place]) 2007   S/P Seed implants    Supraventricular tachycardia ([place])    Temporal arteritis [facility])     Past Surgical History: Past Surgical History:  Procedure Laterality Date   CATARACT EXTRACTION W/PHACO Left 06/07/2015   Procedure: CATARACT EXTRACTION PHACO AND INTRAOCULAR LENS PLACEMENT LEFT EYE CDE=8.00;  Surgeon: Tonny Branch, MD;  Location: [facility];  Service: Ophthalmology;  Laterality: Left;   CATARACT EXTRACTION W/PHACO Right 06/17/2015   Procedure: CATARACT EXTRACTION PHACO AND INTRAOCULAR LENS PLACEMENT RIGHT EYE CDE=11.09;  Surgeon: Tonny Branch, MD;  Location: [facility];  Service: Ophthalmology;  Laterality: Right;   CORONARY ANGIOPLASTY WITH STENT PLACEMENT  01/01/2018   CORONARY STENT INTERVENTION N/A 01/01/2018   Procedure: CORONARY STENT INTERVENTION;  Surgeon: Jettie Booze, MD;  Location: [place] [facility];  Service: Cardiovascular;  Laterality: N/A;   FRACTURE SURGERY     INSERTION PROSTATE RADIATION SEED  2007   KNEE ARTHROSCOPY Left    LEFT HEART CATH AND CORONARY ANGIOGRAPHY N/A 01/01/2018   Procedure: LEFT HEART CATH AND CORONARY ANGIOGRAPHY;  Surgeon: Jettie Booze, MD;  Location: [place] [facility];   Service: Cardiovascular;  Laterality: N/A;   ORIF TIBIA & FIBULA FRACTURES Left 2003   Distal tibial/fibula    PERICARDIAL WINDOW  December 2008   PERICARDIOCENTESIS  2007   hx/notes 10/19/2011    Family History: Family History  Problem Relation Age of Onset   CAD Mother        MI in 43s   Prostate cancer Father    Prostate cancer Brother    Lung cancer Brother     Social History: Social History   Tobacco Use  Smoking Status Former   Packs/day: 0.30   Years: 10.00   Pack years: 3.00   Types: Cigarettes   Start date: 08/06/1958   Quit date: 1968   Years since quitting: 54.5  Smokeless Tobacco Never   Social History   Substance and Sexual Activity  Alcohol Use Not Currently   Social History   Substance and Sexual Activity  Drug Use Never    Allergies: Allergies  Allergen Reactions   Amiodarone     rash    Medications: Current Outpatient Medications  Medication Sig Dispense Refill   allopurinol (ZYLOPRIM) 300 MG tablet Take 450 mg by mouth daily.      apixaban (ELIQUIS) 5 MG TABS tablet Take 1 tablet (5 mg total) by mouth 2 (two) times daily. 60 tablet 6   atorvastatin (LIPITOR) 80 MG tablet TAKE 1 TABLET BY MOUTH ONCE DAILY AT  6  PM 90 tablet 3   benazepril (LOTENSIN) 40 MG tablet Take 1 tablet by mouth once daily 90 tablet 3   diltiazem (CARDIZEM CD) 180 MG 24 hr capsule Take 1 capsule (180 mg total) by mouth daily. 30 capsule 1   FERREX 150 150 MG capsule Take 1 capsule by mouth 2 (two) times daily.     hydrALAZINE (APRESOLINE) 100 MG tablet Take 100 mg by mouth 2 (two) times daily.     hydrochlorothiazide (HYDRODIURIL) 12.5 MG tablet Take 12.5 mg by mouth daily.     latanoprost (XALATAN) 0.005 % ophthalmic solution Place 1 drop into both eyes daily.     metoprolol succinate (TOPROL-XL) 50 MG 24 hr tablet Take 1 tablet (50 mg total) by mouth daily. Take with or immediately following a meal. 90 tablet 3   Multiple Vitamin (MULTIVITAMIN) tablet Take 1  tablet by mouth daily.     pantoprazole (PROTONIX) 40 MG tablet Take 1 tablet by mouth once daily 90 tablet 3   timolol (TIMOPTIC) 0.5 % ophthalmic solution Place 1 drop into both eyes daily.     vitamin C (ASCORBIC ACID) 500 MG tablet Take 500 mg by mouth 2 (two) times daily.      Na Sulfate-K Sulfate-Mg Sulf 17.5-3.13-1.6 GM/177ML SOLN Take 354 mLs by mouth once for 1 dose. 354 mL 0   No current facility-administered medications for this visit.    Review of Systems: GENERAL: negative for malaise, night sweats HEENT: No changes in hearing or vision, no nose bleeds or other nasal problems. NECK: Negative for lumps, goiter, pain and significant neck swelling RESPIRATORY: Negative for cough, wheezing CARDIOVASCULAR: Negative for chest pain, leg swelling, palpitations, orthopnea GI: SEE HPI MUSCULOSKELETAL: Negative for joint pain or  swelling, back pain, and muscle pain. SKIN: Negative for lesions, rash PSYCH: Negative for sleep disturbance, mood disorder and recent psychosocial stressors. HEMATOLOGY Negative for prolonged bleeding, bruising easily, and swollen nodes. ENDOCRINE: Negative for cold or heat intolerance, polyuria, polydipsia and goiter. NEURO: negative for tremor, gait imbalance, syncope and seizures. The remainder of the review of systems is noncontributory.   Physical Exam: BP 131/65 (BP Location: Right Arm, Patient Position: Sitting, Cuff Size: Large)   Pulse (!) 55   Temp 98.5 F (36.9 C) (Oral)   Ht 6' (1.829 m)   Wt 215 lb (97.5 kg)   BMI 29.16 kg/m  GENERAL: The patient is AO x3, in no acute distress. Elder. HEENT: Head is normocephalic and atraumatic. EOMI are intact. Mouth is well hydrated and without lesions. NECK: Supple. No masses LUNGS: Clear to auscultation. No presence of rhonchi/wheezing/rales. Adequate chest expansion HEART: RRR, normal s1 and s2. ABDOMEN: Soft, nontender, no guarding, no peritoneal signs, and nondistended. BS +. No  masses. EXTREMITIES: Without any cyanosis, clubbing, rash, lesions or edema. NEUROLOGIC: AOx3, no focal motor deficit. SKIN: no jaundice, no rashes   Imaging/Labs: as above  I personally reviewed and interpreted the available labs, imaging and endoscopic files.  Impression and Plan: [name] [name] is a 80 y.o. male with PMH atrial fibrillation on Eliquis, coronary artery disease, gout, hypertension, prostate cancer status post seed implants, SVT, temporal arteritis, and history of H. pylori, who presents for evaluation of iron deficiency anemia.  The patient has presented iron deficiency anemia of unclear etiology.  He has undergone previous EGD and colonoscopy at an outside facility without identification of a clear source for his anemia.  However, he had a recent drop in his hemoglobin without requirement of transfusion.  I explained to the patient that we will need to proceed with a push enteroscopy and colonoscopy.  For now he will need to continue taking his oral iron intake as it seems that he has helped improve his iron stores.  For the patient and the family member agreed understood.  - Schedule push enteroscopy and colonoscopy, will need to be off anticoagulation for 48 hours, will obtain clearance for this - Continue oral iron  All questions were answered.      [name] Peppers, MD Gastroenterology and Hepatology [facility]

## 2020-09-20 NOTE — Telephone Encounter (Signed)
   [facility] Pre-operative Risk Assessment    Patient Name: [name] [name]  DOB: February 10, 1941  MRN: [id number]   HEARTCARE STAFF: - Please ensure there is not already an duplicate clearance open for this procedure. - Under Visit Info/Reason for Call, type in Other and utilize the format Clearance MM/DD/YY or Clearance TBD. Do not use dashes or single digits. - If request is for dental extraction, please clarify the # of teeth to be extracted. - If the patient is currently at the dentist's office, call Pre-Op APP to address. If the patient is not currently in the dentist office, please route to the Pre-Op pool  Request for surgical clearance:  What type of surgery is being performed? COLONOSCOPY   When is this surgery scheduled? 10/12/20   What type of clearance is required (medical clearance vs. Pharmacy clearance to hold med vs. Both)? BOTH  Are there any medications that need to be held prior to surgery and how long? ELIQUIS  x 2 DAYS PRIOR TO PROCEDURE   Practice name and name of physician performing surgery? [facility]; DR. Maylon Peppers   What is the office phone number? [phone]   7.   What is the office fax number? [phone]  8.   Anesthesia type (None, local, MAC, general) ? MAC   Julaine Hua 09/20/2020, 4:57 PM  _________________________________________________________________   (provider comments below)

## 2020-09-20 NOTE — Telephone Encounter (Signed)
[name] [name], CMA  

## 2020-09-21 NOTE — Telephone Encounter (Signed)
Patient with diagnosis of atrial fibrillation on Eliquis for anticoagulation.    Procedure: Colonoscopy Date of procedure: 10/12/20  CHA2DS2-VASc Score = 4  This indicates a 4.8% annual risk of stroke. The patient's score is based upon: CHF History: No HTN History: Yes Diabetes History: No Stroke History: No Vascular Disease History: Yes Age Score: 2 Gender Score: 0    CrCl 88 ml/min Platelet count 188K  Per office protocol, patient can hold Eliquis for 1-2 days prior to procedure.    Patient should restart Eliquis as soon as safely possible, at discretion of procedure MD

## 2020-09-21 NOTE — Telephone Encounter (Signed)
   Name: [name] [name]  DOB: [birth year]  MRN: [id number]   Primary Cardiologist: Rozann Lesches, MD  Chart reviewed as part of pre-operative protocol coverage. Patient was contacted 09/21/2020 in reference to pre-operative risk assessment for pending surgery as outlined below.  [name] [name] was last seen on 08/12/20 by Dr. Domenic Polite.  Since that day, [name] [name] has done well. He can complete 4.0 METS without angina (can walk 2 miles daily).  Per office protocol, patient can hold Eliquis for 1-2 days prior to procedure.     Patient should restart Eliquis as soon as safely possible, at discretion   Therefore, based on ACC/AHA guidelines, the patient would be at acceptable risk for the planned procedure without further cardiovascular testing.   The patient was advised that if he develops new symptoms prior to surgery to contact our office to arrange for a follow-up visit, and he verbalized understanding.  I will route this recommendation to the requesting party via Epic fax function and remove from pre-op pool. Please call with questions.  [place], PA 09/21/2020, 2:11 PM

## 2020-09-22 ENCOUNTER — Encounter (INDEPENDENT_AMBULATORY_CARE_PROVIDER_SITE_OTHER): Payer: Self-pay

## 2020-09-23 DIAGNOSIS — E1122 Type 2 diabetes mellitus with diabetic chronic kidney disease: Secondary | ICD-10-CM | POA: Diagnosis not present

## 2020-09-23 DIAGNOSIS — D500 Iron deficiency anemia secondary to blood loss (chronic): Secondary | ICD-10-CM | POA: Diagnosis not present

## 2020-09-23 DIAGNOSIS — N182 Chronic kidney disease, stage 2 (mild): Secondary | ICD-10-CM | POA: Diagnosis not present

## 2020-09-23 DIAGNOSIS — I129 Hypertensive chronic kidney disease with stage 1 through stage 4 chronic kidney disease, or unspecified chronic kidney disease: Secondary | ICD-10-CM | POA: Diagnosis not present

## 2020-09-23 DIAGNOSIS — I482 Chronic atrial fibrillation, unspecified: Secondary | ICD-10-CM | POA: Diagnosis not present

## 2020-09-28 ENCOUNTER — Encounter: Payer: Self-pay | Admitting: Neurology

## 2020-09-30 DIAGNOSIS — D529 Folate deficiency anemia, unspecified: Secondary | ICD-10-CM | POA: Diagnosis not present

## 2020-09-30 DIAGNOSIS — D649 Anemia, unspecified: Secondary | ICD-10-CM | POA: Diagnosis not present

## 2020-09-30 DIAGNOSIS — D500 Iron deficiency anemia secondary to blood loss (chronic): Secondary | ICD-10-CM | POA: Diagnosis not present

## 2020-09-30 DIAGNOSIS — D519 Vitamin B12 deficiency anemia, unspecified: Secondary | ICD-10-CM | POA: Diagnosis not present

## 2020-10-06 NOTE — Patient Instructions (Addendum)
BRYANN MCNEALY  10/06/2020     @PREFPERIOPPHARMACY @   Your procedure is scheduled on  10/12/2020.   Report to [facility] at  [phone]  A.M.   Call this number if you have problems the morning of surgery:  [phone]   Remember:  Follow the diet and prep instructions given to you by the office.    Take these medicines the morning of surgery with A SIP OF WATER      allopurinol, diltiazem, metoprolol, protonix.      Do not wear jewelry, make-up or nail polish.  Do not wear lotions, powders, or perfumes, or deodorant.  Do not shave 48 hours prior to surgery.  Men may shave face and neck.  Do not bring valuables to the hospital.  [facility] is not responsible for any belongings or valuables.  Contacts, dentures or bridgework may not be worn into surgery.  Leave your suitcase in the car.  After surgery it may be brought to your room.  For patients admitted to the hospital, discharge time will be determined by your treatment team.  Patients discharged the day of surgery will not be allowed to drive home and must have someone with them for 24 hours.    Special instructions:     DO NOT smoke tobacco or vape fore 24 hours before your procedure.  Please read over the following fact sheets that you were given. Anesthesia Post-op Instructions and Care and Recovery After Surgery      Upper Endoscopy, Adult, Care After This sheet gives you information about how to care for yourself after your procedure. Your health care provider may also give you more specific instructions. If you have problems or questions, contact your health careprovider. What can I expect after the procedure? After the procedure, it is common to have: A sore throat. Mild stomach pain or discomfort. Bloating. Nausea. Follow these instructions at home:  Follow instructions from your health care provider about what to eat or drink after your procedure. Return to your normal activities as told by your  health care provider. Ask your health care provider what activities are safe for you. Take over-the-counter and prescription medicines only as told by your health care provider. If you were given a sedative during the procedure, it can affect you for several hours. Do not drive or operate machinery until your health care provider says that it is safe. Keep all follow-up visits as told by your health care provider. This is important. Contact a health care provider if you have: A sore throat that lasts longer than one day. Trouble swallowing. Get help right away if: You vomit blood or your vomit looks like coffee grounds. You have: A fever. Bloody, black, or tarry stools. A severe sore throat or you cannot swallow. Difficulty breathing. Severe pain in your chest or abdomen. Summary After the procedure, it is common to have a sore throat, mild stomach discomfort, bloating, and nausea. If you were given a sedative during the procedure, it can affect you for several hours. Do not drive or operate machinery until your health care provider says that it is safe. Follow instructions from your health care provider about what to eat or drink after your procedure. Return to your normal activities as told by your health care provider. This information is not intended to replace advice given to you by your health care provider. Make sure you discuss any questions you have with your healthcare provider.  Document Revised: 03/11/2019 Document Reviewed: 08/13/2017 Elsevier Patient Education  2022 [place]. Colonoscopy, Adult, Care After This sheet gives you information about how to care for yourself after your procedure. Your health care provider may also give you more specific instructions. If you have problems or questions, contact your health careprovider. What can I expect after the procedure? After the procedure, it is common to have: A small amount of blood in your stool for 24 hours after the  procedure. Some gas. Mild cramping or bloating of your abdomen. Follow these instructions at home: Eating and drinking  Drink enough fluid to keep your urine pale yellow. Follow instructions from your health care provider about eating or drinking restrictions. Resume your normal diet as instructed by your health care provider. Avoid heavy or fried foods that are hard to digest.  Activity Rest as told by your health care provider. Avoid sitting for a long time without moving. Get up to take short walks every 1-2 hours. This is important to improve blood flow and breathing. Ask for help if you feel weak or unsteady. Return to your normal activities as told by your health care provider. Ask your health care provider what activities are safe for you. Managing cramping and bloating  Try walking around when you have cramps or feel bloated. Apply heat to your abdomen as told by your health care provider. Use the heat source that your health care provider recommends, such as a moist heat pack or a heating pad. Place a towel between your skin and the heat source. Leave the heat on for 20-30 minutes. Remove the heat if your skin turns bright red. This is especially important if you are unable to feel pain, heat, or cold. You may have a greater risk of getting burned.  General instructions If you were given a sedative during the procedure, it can affect you for several hours. Do not drive or operate machinery until your health care provider says that it is safe. For the first 24 hours after the procedure: Do not sign important documents. Do not drink alcohol. Do your regular daily activities at a slower pace than normal. Eat soft foods that are easy to digest. Take over-the-counter and prescription medicines only as told by your health care provider. Keep all follow-up visits as told by your health care provider. This is important. Contact a health care provider if: You have blood in your stool  2-3 days after the procedure. Get help right away if you have: More than a small spotting of blood in your stool. Large blood clots in your stool. Swelling of your abdomen. Nausea or vomiting. A fever. Increasing pain in your abdomen that is not relieved with medicine. Summary After the procedure, it is common to have a small amount of blood in your stool. You may also have mild cramping and bloating of your abdomen. If you were given a sedative during the procedure, it can affect you for several hours. Do not drive or operate machinery until your health care provider says that it is safe. Get help right away if you have a lot of blood in your stool, nausea or vomiting, a fever, or increased pain in your abdomen. This information is not intended to replace advice given to you by your health care provider. Make sure you discuss any questions you have with your healthcare provider. Document Revised: 03/07/2019 Document Reviewed: 10/07/2018 Elsevier Patient Education  [place] After This sheet gives you  information about how to care for yourself after your procedure. Your health care provider may also give you more specific instructions. If you have problems or questions, contact your health careprovider. What can I expect after the procedure? After the procedure, it is common to have: Tiredness. Forgetfulness about what happened after the procedure. Impaired judgment for important decisions. Nausea or vomiting. Some difficulty with balance. Follow these instructions at home: For the time period you were told by your health care provider:     Rest as needed. Do not participate in activities where you could fall or become injured. Do not drive or use machinery. Do not drink alcohol. Do not take sleeping pills or medicines that cause drowsiness. Do not make important decisions or sign legal documents. Do not take care of children on your  own. Eating and drinking Follow the diet that is recommended by your health care provider. Drink enough fluid to keep your urine pale yellow. If you vomit: Drink water, juice, or soup when you can drink without vomiting. Make sure you have little or no nausea before eating solid foods. General instructions Have a responsible adult stay with you for the time you are told. It is important to have someone help care for you until you are awake and alert. Take over-the-counter and prescription medicines only as told by your health care provider. If you have sleep apnea, surgery and certain medicines can increase your risk for breathing problems. Follow instructions from your health care provider about wearing your sleep device: Anytime you are sleeping, including during daytime naps. While taking prescription pain medicines, sleeping medicines, or medicines that make you drowsy. Avoid smoking. Keep all follow-up visits as told by your health care provider. This is important. Contact a health care provider if: You keep feeling nauseous or you keep vomiting. You feel light-headed. You are still sleepy or having trouble with balance after 24 hours. You develop a rash. You have a fever. You have redness or swelling around the IV site. Get help right away if: You have trouble breathing. You have new-onset confusion at home. Summary For several hours after your procedure, you may feel tired. You may also be forgetful and have poor judgment. Have a responsible adult stay with you for the time you are told. It is important to have someone help care for you until you are awake and alert. Rest as told. Do not drive or operate machinery. Do not drink alcohol or take sleeping pills. Get help right away if you have trouble breathing, or if you suddenly become confused. This information is not intended to replace advice given to you by your health care provider. Make sure you discuss any questions you  have with your healthcare provider. Document Revised: 11/27/2019 Document Reviewed: 02/13/2019 Elsevier Patient Education  2022 [employer].

## 2020-10-08 ENCOUNTER — Encounter (HOSPITAL_COMMUNITY): Payer: Self-pay

## 2020-10-08 ENCOUNTER — Other Ambulatory Visit: Payer: Self-pay

## 2020-10-08 ENCOUNTER — Encounter (HOSPITAL_COMMUNITY)
Admission: RE | Admit: 2020-10-08 | Discharge: 2020-10-08 | Disposition: A | Discharge status: Home / Self Care | Payer: Medicare HMO | Source: Ambulatory Visit | Attending: Gastroenterology | Admitting: Gastroenterology

## 2020-10-08 HISTORY — DX: Unspecified glaucoma: H40.9

## 2020-10-08 HISTORY — DX: Gastro-esophageal reflux disease without esophagitis: K21.9

## 2020-10-08 NOTE — Progress Notes (Signed)
   10/08/20 1101  OBSTRUCTIVE SLEEP APNEA  Have you ever been diagnosed with sleep apnea through a sleep study? No  Do you snore loudly (loud enough to be heard through closed doors)?  1  Do you often feel tired, fatigued, or sleepy during the daytime (such as falling asleep during driving or talking to someone)? 0  Has anyone observed you stop breathing during your sleep? 0  Do you have, or are you being treated for high blood pressure? 1  BMI more than 35 kg/m2? 1  Age > 60 (1-yes) 1  Male Gender (Yes=1) 1  Obstructive Sleep Apnea Score 5  Score 5 or greater  Results sent to PCP

## 2020-10-12 ENCOUNTER — Encounter (HOSPITAL_COMMUNITY): Payer: Self-pay | Admitting: Gastroenterology

## 2020-10-12 ENCOUNTER — Ambulatory Visit (HOSPITAL_COMMUNITY): Payer: Medicare HMO | Admitting: Anesthesiology

## 2020-10-12 ENCOUNTER — Encounter (HOSPITAL_COMMUNITY)
Admission: RE | Disposition: A | Discharge status: Home / Self Care | Payer: Self-pay | Source: Home / Self Care | Attending: Gastroenterology

## 2020-10-12 ENCOUNTER — Ambulatory Visit (HOSPITAL_COMMUNITY)
Admission: RE | Admit: 2020-10-12 | Discharge: 2020-10-12 | Disposition: A | Discharge status: Home / Self Care | Payer: Medicare HMO | Attending: Gastroenterology | Admitting: Gastroenterology

## 2020-10-12 DIAGNOSIS — D132 Benign neoplasm of duodenum: Secondary | ICD-10-CM | POA: Insufficient documentation

## 2020-10-12 DIAGNOSIS — K5520 Angiodysplasia of colon without hemorrhage: Secondary | ICD-10-CM | POA: Insufficient documentation

## 2020-10-12 DIAGNOSIS — Z923 Personal history of irradiation: Secondary | ICD-10-CM | POA: Insufficient documentation

## 2020-10-12 DIAGNOSIS — Z79899 Other long term (current) drug therapy: Secondary | ICD-10-CM | POA: Diagnosis not present

## 2020-10-12 DIAGNOSIS — Z87891 Personal history of nicotine dependence: Secondary | ICD-10-CM | POA: Diagnosis not present

## 2020-10-12 DIAGNOSIS — I2510 Atherosclerotic heart disease of native coronary artery without angina pectoris: Secondary | ICD-10-CM | POA: Insufficient documentation

## 2020-10-12 DIAGNOSIS — D120 Benign neoplasm of cecum: Secondary | ICD-10-CM | POA: Diagnosis not present

## 2020-10-12 DIAGNOSIS — Z955 Presence of coronary angioplasty implant and graft: Secondary | ICD-10-CM | POA: Diagnosis not present

## 2020-10-12 DIAGNOSIS — I10 Essential (primary) hypertension: Secondary | ICD-10-CM | POA: Diagnosis not present

## 2020-10-12 DIAGNOSIS — M109 Gout, unspecified: Secondary | ICD-10-CM | POA: Insufficient documentation

## 2020-10-12 DIAGNOSIS — K6389 Other specified diseases of intestine: Secondary | ICD-10-CM | POA: Diagnosis not present

## 2020-10-12 DIAGNOSIS — Z888 Allergy status to other drugs, medicaments and biological substances status: Secondary | ICD-10-CM | POA: Diagnosis not present

## 2020-10-12 DIAGNOSIS — K649 Unspecified hemorrhoids: Secondary | ICD-10-CM | POA: Insufficient documentation

## 2020-10-12 DIAGNOSIS — K317 Polyp of stomach and duodenum: Secondary | ICD-10-CM | POA: Diagnosis not present

## 2020-10-12 DIAGNOSIS — D509 Iron deficiency anemia, unspecified: Secondary | ICD-10-CM | POA: Diagnosis not present

## 2020-10-12 DIAGNOSIS — D123 Benign neoplasm of transverse colon: Secondary | ICD-10-CM

## 2020-10-12 DIAGNOSIS — K449 Diaphragmatic hernia without obstruction or gangrene: Secondary | ICD-10-CM | POA: Diagnosis not present

## 2020-10-12 DIAGNOSIS — K635 Polyp of colon: Secondary | ICD-10-CM | POA: Diagnosis not present

## 2020-10-12 DIAGNOSIS — Z8546 Personal history of malignant neoplasm of prostate: Secondary | ICD-10-CM | POA: Diagnosis not present

## 2020-10-12 DIAGNOSIS — C184 Malignant neoplasm of transverse colon: Secondary | ICD-10-CM | POA: Diagnosis not present

## 2020-10-12 DIAGNOSIS — I480 Paroxysmal atrial fibrillation: Secondary | ICD-10-CM | POA: Insufficient documentation

## 2020-10-12 DIAGNOSIS — K558 Other vascular disorders of intestine: Secondary | ICD-10-CM | POA: Diagnosis not present

## 2020-10-12 DIAGNOSIS — D125 Benign neoplasm of sigmoid colon: Secondary | ICD-10-CM | POA: Diagnosis not present

## 2020-10-12 HISTORY — PX: COLONOSCOPY WITH PROPOFOL: SHX5780

## 2020-10-12 HISTORY — PX: ENTEROSCOPY: SHX5533

## 2020-10-12 HISTORY — PX: ESOPHAGOGASTRODUODENOSCOPY (EGD) WITH PROPOFOL: SHX5813

## 2020-10-12 HISTORY — PX: BIOPSY: SHX5522

## 2020-10-12 HISTORY — PX: POLYPECTOMY: SHX5525

## 2020-10-12 LAB — CBC
HCT: 34 % — ABNORMAL LOW (ref 39.0–52.0)
Hemoglobin: 10.7 g/dL — ABNORMAL LOW (ref 13.0–17.0)
MCH: 28.2 pg (ref 26.0–34.0)
MCHC: 31.5 g/dL (ref 30.0–36.0)
MCV: 89.7 fL (ref 80.0–100.0)
Platelets: 143 10*3/uL — ABNORMAL LOW (ref 150–400)
RBC: 3.79 MIL/uL — ABNORMAL LOW (ref 4.22–5.81)
RDW: 19.7 % — ABNORMAL HIGH (ref 11.5–15.5)
WBC: 4.6 10*3/uL (ref 4.0–10.5)
nRBC: 0 % (ref 0.0–0.2)

## 2020-10-12 SURGERY — COLONOSCOPY WITH PROPOFOL
Anesthesia: General

## 2020-10-12 MED ORDER — LIDOCAINE HCL (CARDIAC) PF 100 MG/5ML IV SOSY
PREFILLED_SYRINGE | INTRAVENOUS | Status: DC | PRN
  Administered 2020-10-12: 100 mg via INTRAVENOUS

## 2020-10-12 MED ORDER — SODIUM CHLORIDE FLUSH 0.9 % IV SOLN
INTRAVENOUS | Status: AC
  Filled 2020-10-12: qty 10

## 2020-10-12 MED ORDER — SPOT INK MARKER SYRINGE KIT
PACK | SUBMUCOSAL | Status: DC | PRN
  Administered 2020-10-12: 1.5 mL via SUBMUCOSAL

## 2020-10-12 MED ORDER — LACTATED RINGERS IV SOLN: INTRAVENOUS | Status: DC

## 2020-10-12 MED ORDER — EPHEDRINE 5 MG/ML INJ
INTRAVENOUS | Status: AC
  Filled 2020-10-12: qty 5

## 2020-10-12 MED ORDER — EPHEDRINE SULFATE 50 MG/ML IJ SOLN
INTRAMUSCULAR | Status: DC | PRN
  Administered 2020-10-12: 5 mg via INTRAVENOUS

## 2020-10-12 MED ORDER — STERILE WATER FOR IRRIGATION IR SOLN
Status: DC | PRN
  Administered 2020-10-12: 1.5 mL

## 2020-10-12 MED ORDER — PROPOFOL 10 MG/ML IV BOLUS
INTRAVENOUS | Status: DC | PRN
  Administered 2020-10-12 (×3): 40 mg via INTRAVENOUS
  Administered 2020-10-12: 80 mg via INTRAVENOUS
  Administered 2020-10-12: 40 mg via INTRAVENOUS
  Administered 2020-10-12: 30 mg via INTRAVENOUS
  Administered 2020-10-12: 100 ug/kg/min via INTRAVENOUS

## 2020-10-12 MED ORDER — SPOT INK MARKER SYRINGE KIT
PACK | SUBMUCOSAL | Status: AC
  Filled 2020-10-12: qty 5

## 2020-10-12 NOTE — Interval H&P Note (Signed)
History and Physical Interval Note:  10/12/2020 9:05 AM [name] [name] is a 80 y.o. male with PMH atrial fibrillation on Eliquis, coronary artery disease, gout, hypertension, prostate cancer status post seed implants, SVT, temporal arteritis, and history of H. pylori, who presents for evaluation of iron deficiency anemia.  Patient denies having any complaints at the moment.  He states that he has not seen any melena, hematochezia, has not present any abdominal pain, nausea, vomiting, fever or chills.  BP (!) 164/61   Pulse (!) 55   Temp 98.3 F (36.8 C) (Oral)   Resp 12   SpO2 98%  [name]: The patient is AO x3, in no acute distress. HEENT: Head is normocephalic and atraumatic. EOMI are intact. Mouth is well hydrated and without lesions. NECK: Supple. No masses LUNGS: Clear to auscultation. No presence of rhonchi/wheezing/rales. Adequate chest expansion HEART: RRR, normal s1 and s2. ABDOMEN: Soft, nontender, no guarding, no peritoneal signs, and nondistended. BS +. No masses. EXTREMITIES: Without any cyanosis, clubbing, rash, lesions or edema. NEUROLOGIC: AOx3, no focal motor deficit. SKIN: no jaundice, no rashes   [name] [name]  has presented today for surgery, with the diagnosis of Iron deficiency Anemia.  The various methods of treatment have been discussed with the patient and family. After consideration of risks, benefits and other options for treatment, the patient has consented to  Procedure(s) with comments: COLONOSCOPY WITH PROPOFOL (N/A) - 10:35 ESOPHAGOGASTRODUODENOSCOPY (EGD) WITH PROPOFOL (N/A) PUSH ENTEROSCOPY (N/A) as a surgical intervention.  The patient's history has been reviewed, patient examined, no change in status, stable for surgery.  I have reviewed the patient's chart and labs.  Questions were answered to the patient's satisfaction.     [name]

## 2020-10-12 NOTE — Op Note (Addendum)
[facility] Patient Name: [name] [name] Procedure Date: 10/12/2020 9:41 AM MRN: [id number] Date of Birth: 04-Feb-1941 Attending MD: Maylon Peppers ,  CSN: [id number] Age: 80 Admit Type: Outpatient Procedure:                Small bowel enteroscopy Indications:              Iron deficiency anemia Providers:                Maylon Peppers, Caprice Kluver, Raphael Gibney,                            Technician Referring MD:              Medicines:                Monitored Anesthesia Care Complications:            No immediate complications. Estimated Blood Loss:     Estimated blood loss: none. Procedure:                Pre-Anesthesia Assessment:                           - Prior to the procedure, a History and Physical                            was performed, and patient medications, allergies                            and sensitivities were reviewed. The patient's                            tolerance of previous anesthesia was reviewed.                           - The risks and benefits of the procedure and the                            sedation options and risks were discussed with the                            patient. All questions were answered and informed                            consent was obtained.                           - ASA Grade Assessment: III - A patient with severe                            systemic disease.                           After obtaining informed consent, the endoscope was                            passed under direct vision. Throughout the  procedure, the patient's blood pressure, pulse, and                            oxygen saturations were monitored continuously. The                            PCF-PH190L ([id number]) scope was introduced through                            the mouth and advanced to the proximal jejunum. The                            small bowel enteroscopy was accomplished without                             difficulty. The patient tolerated the procedure                            well. Scope In: 9:49:32 AM Scope Out: 10:22:59 AM Total Procedure Duration: 0 hours 33 minutes 27 seconds  Findings:      A 2 cm hiatal hernia was present.      The stomach was normal.      A single 5 mm sessile polyp with no bleeding was found in the fourth       portion of the duodenum. The polyp was removed with a cold snare.       Resection and retrieval were complete.      Localized area of nodularity with presence of central depression was       found in the proximal jejunum. There was no necrosis or exudates in the       central part of the area. Area was successfully injected with 1 mL       [place] for a lift polypectomy but there was inadequate lifting.       Biopsies were taken from the border with a cold forceps for histology. 2       cm distal from the nodule, an area was successfully injected with 2 mL       Niger ink for tattooing.      A single small angiodysplastic lesion with no bleeding was found in the       proximal jejunum. Coagulation for bleeding prevention using argon plasma       at 0.3 liters/minute and 20 watts was successful. Impression:               - 2 cm hiatal hernia.                           - Normal stomach.                           - A single duodenal polyp. Resected and retrieved.                           - Nodular jejunal mucosa with central indentation.                            healed ulcer?                           -  A single non-bleeding angiodysplastic lesion in                            the jejunum. Treated with argon plasma coagulation                            (APC). Moderate Sedation:      Per Anesthesia Care Recommendation:           - Discharge patient to home (ambulatory).                           - Resume previous diet.                           - Await pathology results.                           - Restart Eliquis tomorrow night. Procedure  Code(s):        --- Professional ---                           [phone], 62, Small intestinal endoscopy, enteroscopy                            beyond second portion of duodenum, not including                            ileum; with control of bleeding (eg, injection,                            bipolar cautery, unipolar cautery, laser, heater                            probe, stapler, plasma coagulator)                           44364, Small intestinal endoscopy, enteroscopy                            beyond second portion of duodenum, not including                            ileum; with removal of tumor(s), polyp(s), or other                            lesion(s) by snare technique                           44361, 5, Small intestinal endoscopy, enteroscopy                            beyond second portion of duodenum, not including                            ileum; with biopsy, single or multiple  44799, Unlisted procedure, small intestine Diagnosis Code(s):        --- Professional ---                           K44.9, Diaphragmatic hernia without obstruction or                            gangrene                           K31.7, Polyp of stomach and duodenum                           K63.89, Other specified diseases of intestine                           K55.20, Angiodysplasia of colon without hemorrhage                           D50.9, Iron deficiency anemia, unspecified CPT copyright 2019 American Medical Association. All rights reserved. The codes documented in this report are preliminary and upon coder review may  be revised to meet current compliance requirements. Maylon Peppers, MD Maylon Peppers,  10/12/2020 11:25:57 AM This report has been signed electronically. Number of Addenda: 0

## 2020-10-12 NOTE — Op Note (Addendum)
[facility] Patient Name: [name] [name] Procedure Date: 10/12/2020 10:27 AM MRN: [id number] Date of Birth: 05/31/1940 Attending MD: Maylon Peppers ,  CSN: [id number] Age: 80 Admit Type: Outpatient Procedure:                Colonoscopy Indications:              Iron deficiency anemia Providers:                Maylon Peppers, Caprice Kluver, Raphael Gibney,                            Technician Referring MD:              Medicines:                Monitored Anesthesia Care Complications:            No immediate complications. Estimated Blood Loss:     Estimated blood loss: none. Procedure:                Pre-Anesthesia Assessment:                           - Prior to the procedure, a History and Physical                            was performed, and patient medications, allergies                            and sensitivities were reviewed. The patient's                            tolerance of previous anesthesia was reviewed.                           - The risks and benefits of the procedure and the                            sedation options and risks were discussed with the                            patient. All questions were answered and informed                            consent was obtained.                           - ASA Grade Assessment: III - A patient with severe                            systemic disease.                           After obtaining informed consent, the colonoscope                            was passed under direct vision. Throughout the  procedure, the patient's blood pressure, pulse, and                            oxygen saturations were monitored continuously. The                            PCF-[id number] ([id number]) scope was introduced through                            the anus and advanced to the the terminal ileum.                            The colonoscopy was performed without difficulty.                            The  patient tolerated the procedure well. The                            quality of the bowel preparation was good. Scope In: Scope Out: 11:21:40 AM Scope Withdrawal Time: 0 hours 45 minutes 9 seconds  Findings:      Hemorrhoids were found on perianal exam.      The terminal ileum appeared normal.      Seven sessile polyps were found in the transverse colon and cecum. The       polyps were 3 to 8 mm in size. These polyps were removed with a cold       snare. Resection and retrieval were complete, but one transverse colon       polyp could not be retrieved..      A 12 mm polyp was found in the transverse colon. The polyp was sessile.       The polyp was removed with a piecemeal technique using a cold snare.       Resection and retrieval were complete. The borders of the polypectomy       site were coagulated with the tip of the snare.      Two sessile polyps were found in the sigmoid colon. The polyps were 6 to       8 mm in size. These polyps were removed with a cold snare. Resection and       retrieval were complete. Impression:               - Hemorrhoids found on perianal exam.                           - The examined portion of the ileum was normal.                           - Seven 3 to 8 mm polyps in the transverse colon                            and in the cecum, removed with a cold snare.                            Resected and retrieved.                           -  One 12 mm polyp in the transverse colon, removed                            piecemeal using a cold snare. Resected and                            retrieved.                           - Two 6 to 8 mm polyps in the sigmoid colon,                            removed with a cold snare. Resected and retrieved. Moderate Sedation:      Per Anesthesia Care Recommendation:           - Discharge patient to home (ambulatory).                           - Resume previous diet.                           - Await pathology results.                            - Repeat colonoscopy in 3 years for surveillance,                            if medically fit. Procedure Code(s):        --- Professional ---                           [id number], Colonoscopy, flexible; with removal of                            tumor(s), polyp(s), or other lesion(s) by snare                            technique Diagnosis Code(s):        --- Professional ---                           K64.9, Unspecified hemorrhoids                           K63.5, Polyp of colon                           D50.9, Iron deficiency anemia, unspecified CPT copyright 2019 American Medical Association. All rights reserved. The codes documented in this report are preliminary and upon coder review may  be revised to meet current compliance requirements. Maylon Peppers, MD Maylon Peppers,  10/12/2020 11:31:41 AM This report has been signed electronically. Number of Addenda: 0

## 2020-10-12 NOTE — Anesthesia Preprocedure Evaluation (Addendum)
Anesthesia Evaluation  Patient identified by MRN, date of birth, ID band Patient awake    Reviewed: Allergy & Precautions, NPO status , Patient's Chart, lab work & pertinent test results, reviewed documented beta blocker date and time   Airway Mallampati: II  TM Distance: >3 FB Neck ROM: Full    Dental  (+) Dental Advisory Given, Missing, Chipped   Pulmonary former smoker,    Pulmonary exam normal breath sounds clear to auscultation       Cardiovascular Exercise Tolerance: Good hypertension, Pt. on medications and Pt. on home beta blockers + CAD, + Past MI and + Cardiac Stents  + dysrhythmias Atrial Fibrillation  Rhythm:Regular Rate:Normal + Systolic murmurs    Neuro/Psych  Headaches, negative psych ROS   GI/Hepatic Neg liver ROS, GERD  Medicated and Controlled,  Endo/Other  negative endocrine ROS  Renal/GU negative Renal ROS     Musculoskeletal  (+) Arthritis , Osteoarthritis,    Abdominal   Peds  Hematology  (+) anemia ,   Anesthesia Other Findings   Reproductive/Obstetrics                           Anesthesia Physical Anesthesia Plan  ASA: 3  Anesthesia Plan: General   Post-op Pain Management:    Induction: Intravenous  PONV Risk Score and Plan: Propofol infusion  Airway Management Planned: Nasal Cannula and Natural Airway  Additional Equipment:   Intra-op Plan:   Post-operative Plan:   Informed Consent: I have reviewed the patients History and Physical, chart, labs and discussed the procedure including the risks, benefits and alternatives for the proposed anesthesia with the patient or authorized representative who has indicated his/her understanding and acceptance.     Dental advisory given  Plan Discussed with: CRNA and Surgeon  Anesthesia Plan Comments:         Anesthesia Quick Evaluation

## 2020-10-12 NOTE — Transfer of Care (Signed)
Immediate Anesthesia Transfer of Care Note  Patient: [name] [name]  Procedure(s) Performed: COLONOSCOPY WITH PROPOFOL ESOPHAGOGASTRODUODENOSCOPY (EGD) WITH PROPOFOL PUSH ENTEROSCOPY BIOPSY POLYPECTOMY  Patient Location: Short Stay  Anesthesia Type:General  Level of Consciousness: awake, alert , pateint uncooperative and responds to stimulation  Airway & Oxygen Therapy: Patient Spontanous Breathing  Post-op Assessment: Report given to RN, Post -op Vital signs reviewed and stable and Patient moving all extremities X 4  Post vital signs: Reviewed and stable  Last Vitals:  Vitals Value Taken Time  BP    Temp    Pulse    Resp    SpO2      Last Pain:  Vitals:   10/12/20 0944  TempSrc:   PainSc: 0-No pain         Complications: No notable events documented.

## 2020-10-12 NOTE — Discharge Instructions (Addendum)
You are being discharged to home.  Resume your previous diet.  We are waiting for your pathology results.  Restart Eliquis tomorrow night. Your physician has recommended a repeat colonoscopy in three years for surveillance, if medically fit.  Continue oral iron. Follow up in GI clinic in 3 months  PATIENT INSTRUCTIONS POST-ANESTHESIA  IMMEDIATELY FOLLOWING SURGERY:  Do not drive or operate machinery for the first twenty four hours after surgery.  Do not make any important decisions for twenty four hours after surgery or while taking narcotic pain medications or sedatives.  If you develop intractable nausea and vomiting or a severe headache please notify your doctor immediately.  FOLLOW-UP:  Please make an appointment with your surgeon as instructed. You do not need to follow up with anesthesia unless specifically instructed to do so.  WOUND CARE INSTRUCTIONS (if applicable):  Keep a dry clean dressing on the anesthesia/puncture wound site if there is drainage.  Once the wound has quit draining you may leave it open to air.  Generally you should leave the bandage intact for twenty four hours unless there is drainage.  If the epidural site drains for more than 36-48 hours please call the anesthesia department.  QUESTIONS?:  Please feel free to call your physician or the hospital operator if you have any questions, and they will be happy to assist you.

## 2020-10-12 NOTE — Anesthesia Postprocedure Evaluation (Signed)
Anesthesia Post Note  Patient: [name] [name]  Procedure(s) Performed: COLONOSCOPY WITH PROPOFOL ESOPHAGOGASTRODUODENOSCOPY (EGD) WITH PROPOFOL PUSH ENTEROSCOPY BIOPSY POLYPECTOMY  Patient location during evaluation: Phase II Anesthesia Type: General Level of consciousness: awake and alert and oriented Pain management: pain level controlled Vital Signs Assessment: post-procedure vital signs reviewed and stable Respiratory status: spontaneous breathing and respiratory function stable Cardiovascular status: blood pressure returned to baseline and stable Postop Assessment: no apparent nausea or vomiting Anesthetic complications: no   No notable events documented.   Last Vitals:  Vitals:   10/12/20 0849 10/12/20 1128  BP: (!) 164/61 (!) 122/49  Pulse: (!) 55 (!) 54  Resp: 12 18  Temp: 36.8 C 36.8 C  SpO2: 98% 97%    Last Pain:  Vitals:   10/12/20 1128  TempSrc: Oral  PainSc: 0-No pain                 Amarii Amy C Avia Merkley

## 2020-10-12 NOTE — Anesthesia Procedure Notes (Signed)
Date/Time: 10/12/2020 9:51 AM Performed by: Tacy Learn, CRNA Pre-anesthesia Checklist: Patient identified, Emergency Drugs available, Suction available and Patient being monitored Patient Re-evaluated:Patient Re-evaluated prior to induction Oxygen Delivery Method: Nasal cannula Induction Type: IV induction Placement Confirmation: positive ETCO2

## 2020-10-15 ENCOUNTER — Encounter (HOSPITAL_COMMUNITY): Payer: Self-pay | Admitting: Gastroenterology

## 2020-10-15 LAB — SURGICAL PATHOLOGY

## 2020-10-19 ENCOUNTER — Other Ambulatory Visit (INDEPENDENT_AMBULATORY_CARE_PROVIDER_SITE_OTHER): Payer: Self-pay

## 2020-10-19 DIAGNOSIS — C189 Malignant neoplasm of colon, unspecified: Secondary | ICD-10-CM

## 2020-10-26 ENCOUNTER — Other Ambulatory Visit (INDEPENDENT_AMBULATORY_CARE_PROVIDER_SITE_OTHER): Payer: Self-pay | Admitting: Gastroenterology

## 2020-10-26 ENCOUNTER — Encounter (HOSPITAL_COMMUNITY): Payer: Self-pay

## 2020-10-26 ENCOUNTER — Ambulatory Visit (HOSPITAL_COMMUNITY): Payer: Medicare HMO

## 2020-10-26 ENCOUNTER — Other Ambulatory Visit: Payer: Self-pay

## 2020-10-26 ENCOUNTER — Ambulatory Visit (HOSPITAL_COMMUNITY)
Admission: RE | Admit: 2020-10-26 | Discharge: 2020-10-26 | Disposition: A | Discharge status: Home / Self Care | Payer: Medicare HMO | Source: Ambulatory Visit | Attending: Gastroenterology | Admitting: Gastroenterology

## 2020-10-26 DIAGNOSIS — C19 Malignant neoplasm of rectosigmoid junction: Secondary | ICD-10-CM

## 2020-10-26 DIAGNOSIS — C189 Malignant neoplasm of colon, unspecified: Secondary | ICD-10-CM | POA: Insufficient documentation

## 2020-10-26 LAB — POCT I-STAT CREATININE: Creatinine, Ser: 0.8 mg/dL (ref 0.61–1.24)

## 2020-10-26 NOTE — Progress Notes (Signed)
I received a call today from the radiology department stating that the patient had an allergy to amiodarone and based on the explanation from the pharmacy department he can have a severe reaction if he was exposed to an iodinated contrast.  I do not see any previous exposure to iodinated contrast in the past.  Due to this, the CT of the abdomen and pelvis and chest was canceled.  Will need to proceed with an MRI of the chest, abdomen and pelvis to stage his colorectal cancer  Hi [name] [name],   Can you please schedule a MRI of the chest, abdomen and pelvis? Dx: Colorectal cancer. Please set up as ASAP as the patient had these tests canceled when he was about to have the imaging.  Thanks,  Maylon Peppers, MD Gastroenterology and Hepatology [facility]

## 2020-10-27 NOTE — Progress Notes (Signed)
Thanks

## 2020-11-01 ENCOUNTER — Other Ambulatory Visit (INDEPENDENT_AMBULATORY_CARE_PROVIDER_SITE_OTHER): Payer: Self-pay

## 2020-11-01 DIAGNOSIS — C19 Malignant neoplasm of rectosigmoid junction: Secondary | ICD-10-CM

## 2020-11-02 ENCOUNTER — Ambulatory Visit (HOSPITAL_COMMUNITY): Admission: RE | Admit: 2020-11-02 | Payer: Medicare HMO | Source: Ambulatory Visit

## 2020-11-06 ENCOUNTER — Other Ambulatory Visit (INDEPENDENT_AMBULATORY_CARE_PROVIDER_SITE_OTHER): Payer: Self-pay | Admitting: Internal Medicine

## 2020-11-06 ENCOUNTER — Ambulatory Visit (HOSPITAL_COMMUNITY)
Admission: RE | Admit: 2020-11-06 | Discharge: 2020-11-06 | Disposition: A | Discharge status: Home / Self Care | Payer: Medicare HMO | Source: Ambulatory Visit | Attending: Internal Medicine | Admitting: Internal Medicine

## 2020-11-06 DIAGNOSIS — K5730 Diverticulosis of large intestine without perforation or abscess without bleeding: Secondary | ICD-10-CM | POA: Diagnosis not present

## 2020-11-06 DIAGNOSIS — C19 Malignant neoplasm of rectosigmoid junction: Secondary | ICD-10-CM

## 2020-11-06 DIAGNOSIS — C184 Malignant neoplasm of transverse colon: Secondary | ICD-10-CM | POA: Diagnosis not present

## 2020-11-06 DIAGNOSIS — K862 Cyst of pancreas: Secondary | ICD-10-CM | POA: Diagnosis not present

## 2020-11-06 DIAGNOSIS — K7689 Other specified diseases of liver: Secondary | ICD-10-CM | POA: Diagnosis not present

## 2020-11-06 MED ORDER — GADOBUTROL 1 MMOL/ML IV SOLN
10.0000 mL | Freq: Once | INTRAVENOUS | Status: AC | PRN
  Administered 2020-11-06: 10 mL via INTRAVENOUS

## 2020-11-08 ENCOUNTER — Telehealth (INDEPENDENT_AMBULATORY_CARE_PROVIDER_SITE_OTHER): Payer: Self-pay | Admitting: Gastroenterology

## 2020-11-08 DIAGNOSIS — C184 Malignant neoplasm of transverse colon: Secondary | ICD-10-CM | POA: Diagnosis not present

## 2020-11-08 NOTE — Telephone Encounter (Signed)
The patient was seen today by Dr. Monico Blitz at [facility] surgery for evaluation for possible colectomy.  As there was no previous tattoo at the site of the polypectomy where he was found to have colorectal cancer, she recommended having a repeat colonoscopy in 6 to 12 months.  I consider this is adequate as it will help [name] evaluate if there is any remaining tissue in the transverse colon which I will attempt to resect at that time but also will tattoo at that time as well.  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  I spoke with the wife who understood and agreed.  Ann, can you please set up a reminder for a repeat push enteroscopy and colonoscopy in 6 months?  Diagnosis: Duodenal adenoma and colorectal cancer.  Room 3.  Please cancel the referral to oncology for now.  Thanks,  Maylon Peppers, MD Gastroenterology and Hepatology [facility]

## 2020-11-09 ENCOUNTER — Ambulatory Visit (HOSPITAL_COMMUNITY): Payer: Medicare HMO

## 2020-11-09 DIAGNOSIS — H401112 Primary open-angle glaucoma, right eye, moderate stage: Secondary | ICD-10-CM | POA: Diagnosis not present

## 2020-11-09 NOTE — Telephone Encounter (Signed)
6 mth TCS notes in recall

## 2020-11-10 DIAGNOSIS — H903 Sensorineural hearing loss, bilateral: Secondary | ICD-10-CM | POA: Diagnosis not present

## 2020-11-24 DIAGNOSIS — E1122 Type 2 diabetes mellitus with diabetic chronic kidney disease: Secondary | ICD-10-CM | POA: Diagnosis not present

## 2020-11-24 DIAGNOSIS — D500 Iron deficiency anemia secondary to blood loss (chronic): Secondary | ICD-10-CM | POA: Diagnosis not present

## 2020-11-24 DIAGNOSIS — I129 Hypertensive chronic kidney disease with stage 1 through stage 4 chronic kidney disease, or unspecified chronic kidney disease: Secondary | ICD-10-CM | POA: Diagnosis not present

## 2020-11-24 DIAGNOSIS — N182 Chronic kidney disease, stage 2 (mild): Secondary | ICD-10-CM | POA: Diagnosis not present

## 2020-11-24 DIAGNOSIS — I482 Chronic atrial fibrillation, unspecified: Secondary | ICD-10-CM | POA: Diagnosis not present

## 2020-11-30 DIAGNOSIS — E1122 Type 2 diabetes mellitus with diabetic chronic kidney disease: Secondary | ICD-10-CM | POA: Diagnosis not present

## 2020-11-30 DIAGNOSIS — E782 Mixed hyperlipidemia: Secondary | ICD-10-CM | POA: Diagnosis not present

## 2020-11-30 DIAGNOSIS — I10 Essential (primary) hypertension: Secondary | ICD-10-CM | POA: Diagnosis not present

## 2020-11-30 DIAGNOSIS — D519 Vitamin B12 deficiency anemia, unspecified: Secondary | ICD-10-CM | POA: Diagnosis not present

## 2020-11-30 DIAGNOSIS — D649 Anemia, unspecified: Secondary | ICD-10-CM | POA: Diagnosis not present

## 2020-11-30 DIAGNOSIS — K219 Gastro-esophageal reflux disease without esophagitis: Secondary | ICD-10-CM | POA: Diagnosis not present

## 2020-11-30 DIAGNOSIS — E1165 Type 2 diabetes mellitus with hyperglycemia: Secondary | ICD-10-CM | POA: Diagnosis not present

## 2020-11-30 DIAGNOSIS — D529 Folate deficiency anemia, unspecified: Secondary | ICD-10-CM | POA: Diagnosis not present

## 2020-11-30 DIAGNOSIS — E7849 Other hyperlipidemia: Secondary | ICD-10-CM | POA: Diagnosis not present

## 2020-12-06 DIAGNOSIS — D692 Other nonthrombocytopenic purpura: Secondary | ICD-10-CM | POA: Diagnosis not present

## 2020-12-06 DIAGNOSIS — D509 Iron deficiency anemia, unspecified: Secondary | ICD-10-CM | POA: Diagnosis not present

## 2020-12-06 DIAGNOSIS — E7849 Other hyperlipidemia: Secondary | ICD-10-CM | POA: Diagnosis not present

## 2020-12-06 DIAGNOSIS — I10 Essential (primary) hypertension: Secondary | ICD-10-CM | POA: Diagnosis not present

## 2020-12-06 DIAGNOSIS — Z6828 Body mass index (BMI) 28.0-28.9, adult: Secondary | ICD-10-CM | POA: Diagnosis not present

## 2020-12-06 DIAGNOSIS — E1169 Type 2 diabetes mellitus with other specified complication: Secondary | ICD-10-CM | POA: Diagnosis not present

## 2020-12-06 DIAGNOSIS — I4891 Unspecified atrial fibrillation: Secondary | ICD-10-CM | POA: Diagnosis not present

## 2020-12-06 DIAGNOSIS — Z7189 Other specified counseling: Secondary | ICD-10-CM | POA: Diagnosis not present

## 2020-12-15 NOTE — Progress Notes (Signed)
NEUROLOGY CONSULTATION NOTE  [name] [name] MRN: [id number] DOB: 08/06/1940  Referring provider: Gar Ponto, MD Primary care provider: Gar Ponto, MD  Reason for consult:  ataxia  Assessment/Plan:   Unsteady gait - may be multifactorial related to asymmetric leg length complicated by diabetic neuropathy and anemia.  Reports improvement over past month, possibly because both anemia and diabetes are being treated.  Due to non-focal symptoms and negative MRI, I cannot definitively say that he had a mild stroke back in December.  Regardless, he is already on stroke prevention management  No further recommendations.   Subjective:  [name] [name] is an 80 year old male with HTN, DM II, colorectal cancer, arthritis, asymmetric sensorineural hearing loss, CAD s/p NSTEMI, paroxysmal atrial fibrillation on AC, gout and history of prostate cancer and temporal arteritis who presents for ataxia.  History supplemented by referring provider's note.  He is accompanied by his wife who provides collateral history.  In December, he started having unsteady gait.  At the time, his wife said he had trouble with word-finding as well.  Family was concerned that he had a mild stroke.  Workup included an MRI of the brain without contrast on 03/24/2020 which showed mild cerebral atrophy and minimal chronic microvascular ischemic changes as well as incidental subependymal gray matter heterotopia but no concerning findings that would be a cause for unsteady gait.  Since then, he started using a cane.  Over the past couple of years, he has had a Hgb A1c greater than 7.  Labs from May 2022 included Hgb A1c  7.3%.and TSH 3.250.  B12 from February 2022 was 648.  He noted blood from rectum.  Labs revealed anemia.  He was found to have a cancerous polyp that was removed this past summer.  Hgb and Hct have improved.  He started treatment for diabetes.  Denies neck and back pain.  Denies numbness in feet and pain and weakness  in legs.    He didn't respond well to physical therapy but notes improvement over the past 6 to 8 weeks.  He has asymmetric sensorineural hearing loss.  He fell off the roof years ago, crushing his left ankle.  Since then, his left leg is about 1/2 inch shorter than his right leg.  He sees a Restaurant manager, fast food for this.    PAST MEDICAL HISTORY: Past Medical History:  Diagnosis Date   Anemia    Arthritis    CAD (coronary artery disease)    DES to circumflex 12/2017   GERD (gastroesophageal reflux disease)    Glaucoma    Gout    Headache    History of kidney stones    Hypertension    NSTEMI (non-ST elevated myocardial infarction) ([place]) 12/31/2017   Paroxysmal atrial fibrillation (HCC)    Pericardial effusion    Idiopathic, recurrent pericardial effusion s/p pericardial window.   Prostate cancer ([place]) 2007   S/P Seed implants    Supraventricular tachycardia ([place])    Temporal arteritis ([place])     PAST SURGICAL HISTORY: Past Surgical History:  Procedure Laterality Date   BIOPSY  10/12/2020   Procedure: BIOPSY;  Surgeon: [name], MD;  [facility];  Service: Gastroenterology;;   CATARACT EXTRACTION W/PHACO Left 06/07/2015   Procedure: CATARACT EXTRACTION PHACO AND INTRAOCULAR LENS PLACEMENT LEFT EYE CDE=8.00;  Surgeon: Tonny Branch, MD;  Location: [facility];  Service: Ophthalmology;  Laterality: Left;   CATARACT EXTRACTION W/PHACO Right 06/17/2015   Procedure: CATARACT EXTRACTION PHACO AND INTRAOCULAR  LENS PLACEMENT RIGHT EYE CDE=11.09;  Surgeon: Tonny Branch, MD;  Location: [facility];  Service: Ophthalmology;  Laterality: Right;   COLONOSCOPY WITH PROPOFOL N/A 10/12/2020   Procedure: COLONOSCOPY WITH PROPOFOL;  Surgeon: [name], MD;  [facility];  Service: Gastroenterology;  Laterality: N/A;  10:35   CORONARY ANGIOPLASTY WITH STENT PLACEMENT  01/01/2018   CORONARY STENT INTERVENTION N/A 01/01/2018   Procedure: CORONARY STENT INTERVENTION;   Surgeon: Jettie Booze, MD;  Location: [facility];  Service: Cardiovascular;  Laterality: N/A;   ENTEROSCOPY N/A 10/12/2020   Procedure: PUSH ENTEROSCOPY;  Surgeon: [name], MD;  [facility];  Service: Gastroenterology;  Laterality: N/A;   ESOPHAGOGASTRODUODENOSCOPY (EGD) WITH PROPOFOL N/A 10/12/2020   Procedure: ESOPHAGOGASTRODUODENOSCOPY (EGD) WITH PROPOFOL;  Surgeon: [name], MD;  [facility];  Service: Gastroenterology;  Laterality: N/A;   FRACTURE SURGERY Left 2010   ankle   INSERTION PROSTATE RADIATION SEED  2007   KNEE ARTHROSCOPY Left    LEFT HEART CATH AND CORONARY ANGIOGRAPHY N/A 01/01/2018   Procedure: LEFT HEART CATH AND CORONARY ANGIOGRAPHY;  Surgeon: Jettie Booze, MD;  Location: [facility];  Service: Cardiovascular;  Laterality: N/A;   ORIF TIBIA & FIBULA FRACTURES Left 2003   Distal tibial/fibula    PERICARDIAL WINDOW  02/2007   PERICARDIOCENTESIS  2007   hx/notes 10/19/2011   POLYPECTOMY  10/12/2020   Procedure: POLYPECTOMY;  Surgeon: Montez Morita, [name], MD;  [facility];  Service: Gastroenterology;;  small bowel, cecal    MEDICATIONS: Current Outpatient Medications on File Prior to Visit  Medication Sig Dispense Refill   allopurinol (ZYLOPRIM) 300 MG tablet Take 450 mg by mouth daily.      apixaban (ELIQUIS) 5 MG TABS tablet Take 1 tablet (5 mg total) by mouth 2 (two) times daily. 60 tablet 6   atorvastatin (LIPITOR) 80 MG tablet TAKE 1 TABLET BY MOUTH ONCE DAILY AT  6  PM 90 tablet 3   benazepril (LOTENSIN) 40 MG tablet Take 1 tablet by mouth once daily (Patient taking differently: Take 40 mg by mouth daily.) 90 tablet 3   diltiazem (CARDIZEM CD) 180 MG 24 hr capsule Take 1 capsule (180 mg total) by mouth daily. 30 capsule 1   FERREX 150 150 MG capsule Take 150 mg by mouth 2 (two) times daily.     hydrALAZINE (APRESOLINE) 100 MG tablet Take 100 mg by mouth 2 (two)  times daily.     hydrochlorothiazide (HYDRODIURIL) 12.5 MG tablet Take 12.5 mg by mouth daily.     latanoprost (XALATAN) 0.005 % ophthalmic solution Place 1 drop into both eyes at bedtime.     metoprolol succinate (TOPROL-XL) 50 MG 24 hr tablet Take 1 tablet (50 mg total) by mouth daily. Take with or immediately following a meal. 90 tablet 3   Multiple Vitamin (MULTIVITAMIN) tablet Take 1 tablet by mouth daily.     pantoprazole (PROTONIX) 40 MG tablet Take 1 tablet by mouth once daily 90 tablet 3   SIMBRINZA 1-0.2 % SUSP Place 1 drop into both eyes 2 (two) times daily.     vitamin C (ASCORBIC ACID) 500 MG tablet Take 500 mg by mouth 2 (two) times daily.      No current facility-administered medications on file prior to visit.    ALLERGIES: Allergies  Allergen Reactions   Amiodarone     rash    FAMILY HISTORY: Family History  Problem Relation  Age of Onset   CAD Mother        MI in 80s   Prostate cancer Father    Prostate cancer Brother    Lung cancer Brother     Objective:  Blood pressure (!) 124/51, pulse (!) 50, height 5\' 6"  (1.676 m), weight 212 lb (96.2 kg), SpO2 98 %. General: No acute distress.  Patient appears well-groomed.   Head:  Normocephalic/atraumatic Eyes:  fundi examined but not visualized Neck: supple, no paraspinal tenderness, full range of motion Back: No paraspinal tenderness Heart: regular rate and rhythm Lungs: Clear to auscultation bilaterally. Vascular: No carotid bruits. Neurological Exam: Mental status: alert and oriented to person, place, and time, recent and remote memory intact, fund of knowledge intact, attention and concentration intact, speech fluent and not dysarthric, language intact. Cranial nerves: CN I: not tested CN II: pupils equal, round and reactive to light, visual fields intact CN III, IV, VI:  full range of motion, no nystagmus, no ptosis CN V: facial sensation intact. CN VII: upper and lower face symmetric CN VIII: hearing  intact CN IX, X: gag intact, uvula midline CN XI: sternocleidomastoid and trapezius muscles intact CN XII: tongue midline Bulk & Tone: normal, no fasciculations. Motor:  muscle strength 5/5 throughout Sensation:  Pinprick sensation intact, vibratory sensation reduced in feet. Deep Tendon Reflexes:  1+ throughout,  toes downgoing.   Finger to nose testing:  Without dysmetria.   Heel to shin:  Without dysmetria.   Gait:  Broad-based gait with limp.  Romberg negative.    Thank you for allowing me to take part in the care of this patient.  Metta Clines, DO  CC: Gar Ponto, MD

## 2020-12-16 ENCOUNTER — Encounter: Payer: Self-pay | Admitting: Neurology

## 2020-12-16 ENCOUNTER — Ambulatory Visit: Payer: Medicare HMO | Admitting: Neurology

## 2020-12-16 ENCOUNTER — Other Ambulatory Visit: Payer: Self-pay

## 2020-12-16 VITALS — BP 124/51 | HR 50 | Ht 66.0 in | Wt 212.0 lb

## 2020-12-16 DIAGNOSIS — R2681 Unsteadiness on feet: Secondary | ICD-10-CM

## 2020-12-16 NOTE — Patient Instructions (Signed)
I really can't say if you had a light stroke in December.  The balance problems may be a combination of having different length of legs with diabetes and anemia.  Now that the anemia and diabetes are being treated, you have improved.  I don't suspect anything dangerous or other secondary cause at this time

## 2020-12-30 DIAGNOSIS — C61 Malignant neoplasm of prostate: Secondary | ICD-10-CM | POA: Diagnosis not present

## 2021-01-31 ENCOUNTER — Encounter (INDEPENDENT_AMBULATORY_CARE_PROVIDER_SITE_OTHER): Payer: Self-pay | Admitting: Gastroenterology

## 2021-01-31 ENCOUNTER — Ambulatory Visit (INDEPENDENT_AMBULATORY_CARE_PROVIDER_SITE_OTHER): Payer: Medicare HMO | Admitting: Gastroenterology

## 2021-01-31 ENCOUNTER — Telehealth (INDEPENDENT_AMBULATORY_CARE_PROVIDER_SITE_OTHER): Payer: Self-pay

## 2021-01-31 ENCOUNTER — Encounter (INDEPENDENT_AMBULATORY_CARE_PROVIDER_SITE_OTHER): Payer: Self-pay

## 2021-01-31 ENCOUNTER — Other Ambulatory Visit: Payer: Self-pay

## 2021-01-31 ENCOUNTER — Other Ambulatory Visit (INDEPENDENT_AMBULATORY_CARE_PROVIDER_SITE_OTHER): Payer: Self-pay

## 2021-01-31 VITALS — BP 162/68 | HR 65 | Temp 97.8°F | Ht 72.0 in | Wt 213.0 lb

## 2021-01-31 DIAGNOSIS — C189 Malignant neoplasm of colon, unspecified: Secondary | ICD-10-CM | POA: Insufficient documentation

## 2021-01-31 DIAGNOSIS — D132 Benign neoplasm of duodenum: Secondary | ICD-10-CM | POA: Diagnosis not present

## 2021-01-31 DIAGNOSIS — D509 Iron deficiency anemia, unspecified: Secondary | ICD-10-CM | POA: Diagnosis not present

## 2021-01-31 DIAGNOSIS — C184 Malignant neoplasm of transverse colon: Secondary | ICD-10-CM | POA: Diagnosis not present

## 2021-01-31 NOTE — Patient Instructions (Signed)
Schedule colonoscopy

## 2021-01-31 NOTE — H&P (View-Only) (Signed)
[name] [name], M.D. Gastroenterology & Hepatology [facility] [address] [place], [place] [address]  Primary Care Physician: Caryl Bis, MD [place] [id number]  I will communicate my assessment and recommendations to the referring MD via EMR.  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 male  with PMH transverse colon adenocarcinoma, atrial fibrillation on Eliquis, coronary artery disease, gout, hypertension, prostate cancer status post seed implants, SVT, temporal arteritis, and history of H. pylori, who presents for follow up of iron deficiency anemia and colon cancer.  The patient was last seen on 09/20/2020. At that time, the patient was scheduled to undergo a push enteroscopy and a colonoscopy for evaluation of persistent iron deficiency anemia.  EGD and colonoscopy were performed with findings described below.  As he was found to have presence of adenocarcinoma arising from a tubular adenoma with high-grade dysplasia, he was referred for extension of studies.  He underwent an MR of the abdomen and pelvis with IV contrast that was negative for any extraintestinal metastasis, although an MRI of the chest could not be performed.  There was significant motion artifact when the MRI was performed.  Notably, the patient was referred for evaluation for possible surgical management by Dr. Marcello Moores but since he did not have a tattoo performed at the area of polypectomy, no surgical intervention could be performed (localization of the resection margins would be difficult).  It was considered that he should have a repeat endoscopic surveillance in 6 to 12 months.  This was discussed with the patient.  Patient reports feeling well and denies having  any complaints at the moment.  States that he is still taking his oral iron compliantly.  The wife reports that his last H&H was performed a few weeks ago and it was in the high 11's but no report is available.  She states that he will have repeat blood work-up performed next week and they will ask to fax the report to my office.  The patient denies having any nausea, vomiting, fever, chills, hematochezia, melena, hematemesis, abdominal distention, abdominal pain, diarrhea, jaundice, pruritus or weight loss.  States his stool has been darker since he started taking oral iron.  Last EGD: 10/11/2020, 2 cm hiatal hernia, normal stomach, single polyp was found in the 4 portion of duodenum which was resected with a cold snare (duodenal adenoma), there was an area of nodularity with a central depression in the proximal jejunum which was biopsied (duodenal adenoma) Last Colonoscopy: 10/11/2020, external hemorrhoids were found on perianal exam, normal terminal ileum, 7 sessile polyps were found in the transverse and cecum between 3 to 8 mm which were resected with cold snare, there was a 12 mm polyp in the transverse colon which had inflammatory components on the surface (this was an adenocarcinoma arising in a high-grade dysplasia background) which was resected in a piecemeal fashion with cold snare and edges were cauterized with soft coag with the patient and her, there were 2 polyps between 6 to 8 mm in the sigmoid colon  Past Medical History: Past Medical History:  Diagnosis Date   Anemia    Arthritis    CAD (coronary artery disease)    DES to circumflex 12/2017   GERD (gastroesophageal reflux disease)    Glaucoma  Gout    Headache    History of kidney stones    Hypertension    NSTEMI (non-ST elevated myocardial infarction) ([place]) 12/31/2017   Paroxysmal atrial fibrillation (HCC)    Pericardial effusion    Idiopathic, recurrent pericardial effusion s/p pericardial window.   Prostate cancer ([place])  2007   S/P Seed implants    Supraventricular tachycardia ([place])    Temporal arteritis ([place])     Past Surgical History: Past Surgical History:  Procedure Laterality Date   BIOPSY  10/12/2020   Procedure: BIOPSY;  Surgeon: [name], MD;  [facility];  Service: Gastroenterology;;   CATARACT EXTRACTION W/PHACO Left 06/07/2015   Procedure: CATARACT EXTRACTION PHACO AND INTRAOCULAR LENS PLACEMENT LEFT EYE CDE=8.00;  Surgeon: Tonny Branch, MD;  [facility]: [facility];  Service: Ophthalmology;  Laterality: Left;   CATARACT EXTRACTION W/PHACO Right 06/17/2015   Procedure: CATARACT EXTRACTION PHACO AND INTRAOCULAR LENS PLACEMENT RIGHT EYE CDE=11.09;  Surgeon: Tonny Branch, MD;  Location: [facility];  Service: Ophthalmology;  Laterality: Right;   COLONOSCOPY WITH PROPOFOL N/A 10/12/2020   Procedure: COLONOSCOPY WITH PROPOFOL;  Surgeon: [name], MD;  [facility];  Service: Gastroenterology;  Laterality: N/A;  10:35   CORONARY ANGIOPLASTY WITH STENT PLACEMENT  01/01/2018   CORONARY STENT INTERVENTION N/A 01/01/2018   Procedure: CORONARY STENT INTERVENTION;  Surgeon: Jettie Booze, MD;  Location: [facility];  Service: Cardiovascular;  Laterality: N/A;   ENTEROSCOPY N/A 10/12/2020   Procedure: PUSH ENTEROSCOPY;  Surgeon: [name], MD;  [facility];  Service: Gastroenterology;  Laterality: N/A;   ESOPHAGOGASTRODUODENOSCOPY (EGD) WITH PROPOFOL N/A 10/12/2020   Procedure: ESOPHAGOGASTRODUODENOSCOPY (EGD) WITH PROPOFOL;  Surgeon: [name], MD;  [facility];  Service: Gastroenterology;  Laterality: N/A;   FRACTURE SURGERY Left 2010   ankle   INSERTION PROSTATE RADIATION SEED  2007   KNEE ARTHROSCOPY Left    LEFT HEART CATH AND CORONARY ANGIOGRAPHY N/A 01/01/2018   Procedure: LEFT HEART CATH AND CORONARY ANGIOGRAPHY;  Surgeon: Jettie Booze, MD;  Location: [facility];  Service:  Cardiovascular;  Laterality: N/A;   ORIF TIBIA & FIBULA FRACTURES Left 2003   Distal tibial/fibula    PERICARDIAL WINDOW  02/2007   PERICARDIOCENTESIS  2007   hx/notes 10/19/2011   POLYPECTOMY  10/12/2020   Procedure: POLYPECTOMY;  Surgeon: [name], MD;  [facility];  Service: Gastroenterology;;  small bowel, cecal    Family History: Family History  Problem Relation Age of Onset   CAD Mother        MI in 33s   Prostate cancer Father    Prostate cancer Brother    Lung cancer Brother     Social History: Social History   Tobacco Use  Smoking Status Former   Packs/day: 0.30   Years: 10.00   Pack years: 3.00   Types: Cigarettes   Start date: 08/06/1958   Quit date: 1968   Years since quitting: 54.8  Smokeless Tobacco Never   Social History   Substance and Sexual Activity  Alcohol Use Not Currently   Social History   Substance and Sexual Activity  Drug Use Never    Allergies: Allergies  Allergen Reactions   Amiodarone     rash    Medications: Current Outpatient Medications  Medication Sig Dispense Refill   allopurinol (ZYLOPRIM) 300 MG tablet Take 450 mg by mouth daily.      apixaban (ELIQUIS)  5 MG TABS tablet Take 1 tablet (5 mg total) by mouth 2 (two) times daily. 60 tablet 6   atorvastatin (LIPITOR) 80 MG tablet TAKE 1 TABLET BY MOUTH ONCE DAILY AT  6  PM 90 tablet 3   benazepril (LOTENSIN) 40 MG tablet Take 1 tablet by mouth once daily (Patient taking differently: Take 40 mg by mouth daily.) 90 tablet 3   diltiazem (CARDIZEM CD) 180 MG 24 hr capsule Take 1 capsule (180 mg total) by mouth daily. 30 capsule 1   FERREX 150 150 MG capsule Take 150 mg by mouth 2 (two) times daily.     hydrALAZINE (APRESOLINE) 100 MG tablet Take 100 mg by mouth 2 (two) times daily.     hydrochlorothiazide (HYDRODIURIL) 12.5 MG tablet Take 12.5 mg by mouth daily.     latanoprost (XALATAN) 0.005 % ophthalmic solution Place 1 drop into both eyes at  bedtime.     metFORMIN (GLUCOPHAGE-XR) 500 MG 24 hr tablet Take 500 mg by mouth 2 (two) times daily.     metoprolol succinate (TOPROL-XL) 50 MG 24 hr tablet Take 1 tablet (50 mg total) by mouth daily. Take with or immediately following a meal. 90 tablet 3   Multiple Vitamin (MULTIVITAMIN) tablet Take 1 tablet by mouth daily.     pantoprazole (PROTONIX) 40 MG tablet Take 1 tablet by mouth once daily 90 tablet 3   SIMBRINZA 1-0.2 % SUSP Place 1 drop into both eyes 2 (two) times daily.     vitamin C (ASCORBIC ACID) 500 MG tablet Take 500 mg by mouth 2 (two) times daily.      No current facility-administered medications for this visit.    Review of Systems: GENERAL: negative for malaise, night sweats HEENT: No changes in hearing or vision, no nose bleeds or other nasal problems. NECK: Negative for lumps, goiter, pain and significant neck swelling RESPIRATORY: Negative for cough, wheezing CARDIOVASCULAR: Negative for chest pain, leg swelling, palpitations, orthopnea GI: SEE HPI MUSCULOSKELETAL: Negative for joint pain or swelling, back pain, and muscle pain. SKIN: Negative for lesions, rash PSYCH: Negative for sleep disturbance, mood disorder and recent psychosocial stressors. HEMATOLOGY Negative for prolonged bleeding, bruising easily, and swollen nodes. ENDOCRINE: Negative for cold or heat intolerance, polyuria, polydipsia and goiter. NEURO: negative for tremor, gait imbalance, syncope and seizures. The remainder of the review of systems is noncontributory.   Physical Exam: BP (!) 162/68 (BP Location: Left Arm, Patient Position: Sitting, Cuff Size: Large)   Pulse 65   Temp 97.8 F (36.6 C) (Oral)   Ht 6' (1.829 m)   Wt 213 lb (96.6 kg)   BMI 28.89 kg/m  GENERAL: The patient is AO x3, in no acute distress. HEENT: Head is normocephalic and atraumatic. EOMI are intact. Mouth is well hydrated and without lesions. NECK: Supple. No masses LUNGS: Clear to auscultation. No presence of  rhonchi/wheezing/rales. Adequate chest expansion HEART: RRR, normal s1 and s2. ABDOMEN: Soft, nontender, no guarding, no peritoneal signs, and nondistended. BS +. No masses. EXTREMITIES: Without any cyanosis, clubbing, rash, lesions or edema. NEUROLOGIC: AOx3, no focal motor deficit. SKIN: no jaundice, no rashes  Imaging/Labs: as above  I personally reviewed and interpreted the available labs, imaging and endoscopic files.  Impression and Plan: [name] [name] is a 80 y.o. male  with PMH transverse colon adenocarcinoma, atrial fibrillation on Eliquis, coronary artery disease, gout, hypertension, prostate cancer status post seed implants, SVT, temporal arteritis, and history of H. pylori, who presents for follow up  of iron deficiency anemia and colon cancer.  Regarding his colon adenocarcinoma, he did not have any evidence of extraintestinal extension of this cancer to any other tissue.  Unfortunately, as it was considered that his polyp had only inflammatory components endoscopically and no major concerning alterations for malignancy, this was not tattooed.  Due to this, a, discussion was held to survey the area of polypectomy with a colonoscopy, for which she will be due this month.  He would like to have this scheduled after Thanksgiving.  At that time we will consider performing CA testing based on the endoscopic appearance of the area.  If he has recurrent abnormal tissue, this will be biopsied and tattooed, as well as it would be referred again to general surgery.  For for now, he should continue with the oral iron supplementation and he will need to fax the results of the upcoming CBC.  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  I did to this area in the past but would like to have our advanced endoscopist Dr. Rush Landmark reviewed the case and determine if he can remove it  endoscopically.  - Schedule colonoscopy -we will check CEA depending on endoscopic findings - Patient to fax report of upcoming CBC - Will discuss case with Dr. Rush Landmark regarding resection of jejunal adenoma  All questions were answered.      [name], MD Gastroenterology and Hepatology [facility]

## 2021-01-31 NOTE — Progress Notes (Signed)
Maylon Peppers, M.D. Gastroenterology & Hepatology [facility] [address] [place], [place] [address]  Primary Care Physician: Caryl Bis, MD [place] [id number]  I will communicate my assessment and recommendations to the referring MD via EMR.  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 male  with PMH transverse colon adenocarcinoma, atrial fibrillation on Eliquis, coronary artery disease, gout, hypertension, prostate cancer status post seed implants, SVT, temporal arteritis, and history of H. pylori, who presents for follow up of iron deficiency anemia and colon cancer.  The patient was last seen on 09/20/2020. At that time, the patient was scheduled to undergo a push enteroscopy and a colonoscopy for evaluation of persistent iron deficiency anemia.  EGD and colonoscopy were performed with findings described below.  As he was found to have presence of adenocarcinoma arising from a tubular adenoma with high-grade dysplasia, he was referred for extension of studies.  He underwent an MR of the abdomen and pelvis with IV contrast that was negative for any extraintestinal metastasis, although an MRI of the chest could not be performed.  There was significant motion artifact when the MRI was performed.  Notably, the patient was referred for evaluation for possible surgical management by Dr. Marcello Moores but since he did not have a tattoo performed at the area of polypectomy, no surgical intervention could be performed (localization of the resection margins would be difficult).  It was considered that he should have a repeat endoscopic surveillance in 6 to 12 months.  This was discussed with the patient.  Patient reports feeling well and denies having  any complaints at the moment.  States that he is still taking his oral iron compliantly.  The wife reports that his last H&H was performed a few weeks ago and it was in the high 11's but no report is available.  She states that he will have repeat blood work-up performed next week and they will ask to fax the report to my office.  The patient denies having any nausea, vomiting, fever, chills, hematochezia, melena, hematemesis, abdominal distention, abdominal pain, diarrhea, jaundice, pruritus or weight loss.  States his stool has been darker since he started taking oral iron.  Last EGD: 10/11/2020, 2 cm hiatal hernia, normal stomach, single polyp was found in the 4 portion of duodenum which was resected with a cold snare (duodenal adenoma), there was an area of nodularity with a central depression in the proximal jejunum which was biopsied (duodenal adenoma) Last Colonoscopy: 10/11/2020, external hemorrhoids were found on perianal exam, normal terminal ileum, 7 sessile polyps were found in the transverse and cecum between 3 to 8 mm which were resected with cold snare, there was a 12 mm polyp in the transverse colon which had inflammatory components on the surface (this was an adenocarcinoma arising in a high-grade dysplasia background) which was resected in a piecemeal fashion with cold snare and edges were cauterized with soft coag with the patient and her, there were 2 polyps between 6 to 8 mm in the sigmoid colon  Past Medical History: Past Medical History:  Diagnosis Date   Anemia    Arthritis    CAD (coronary artery disease)    DES to circumflex 12/2017   GERD (gastroesophageal reflux disease)    Glaucoma  Gout    Headache    History of kidney stones    Hypertension    NSTEMI (non-ST elevated myocardial infarction) ([place]) 12/31/2017   Paroxysmal atrial fibrillation ([facility])    Pericardial effusion    Idiopathic, recurrent pericardial effusion s/p pericardial window.   Prostate cancer ([place])  2007   S/P Seed implants    Supraventricular tachycardia ([place])    Temporal arteritis ([place])     Past Surgical History: Past Surgical History:  Procedure Laterality Date   BIOPSY  10/12/2020   Procedure: BIOPSY;  Surgeon: [name], MD;  [facility];  Service: Gastroenterology;;   CATARACT EXTRACTION W/PHACO Left 06/07/2015   Procedure: CATARACT EXTRACTION PHACO AND INTRAOCULAR LENS PLACEMENT LEFT EYE CDE=8.00;  Surgeon: Tonny Branch, MD;  Location: [facility];  Service: Ophthalmology;  Laterality: Left;   CATARACT EXTRACTION W/PHACO Right 06/17/2015   Procedure: CATARACT EXTRACTION PHACO AND INTRAOCULAR LENS PLACEMENT RIGHT EYE CDE=11.09;  Surgeon: Tonny Branch, MD;  Location: [facility];  Service: Ophthalmology;  Laterality: Right;   COLONOSCOPY WITH PROPOFOL N/A 10/12/2020   Procedure: COLONOSCOPY WITH PROPOFOL;  Surgeon: [name], MD;  [facility];  Service: Gastroenterology;  Laterality: N/A;  10:35   CORONARY ANGIOPLASTY WITH STENT PLACEMENT  01/01/2018   CORONARY STENT INTERVENTION N/A 01/01/2018   Procedure: CORONARY STENT INTERVENTION;  Surgeon: Jettie Booze, MD;  Location: [place] [facility];  Service: Cardiovascular;  Laterality: N/A;   ENTEROSCOPY N/A 10/12/2020   Procedure: PUSH ENTEROSCOPY;  Surgeon: [name], MD;  [facility];  Service: Gastroenterology;  Laterality: N/A;   ESOPHAGOGASTRODUODENOSCOPY (EGD) WITH PROPOFOL N/A 10/12/2020   Procedure: ESOPHAGOGASTRODUODENOSCOPY (EGD) WITH PROPOFOL;  Surgeon: [name], MD;  [facility];  Service: Gastroenterology;  Laterality: N/A;   FRACTURE SURGERY Left 2010   ankle   INSERTION PROSTATE RADIATION SEED  2007   KNEE ARTHROSCOPY Left    LEFT HEART CATH AND CORONARY ANGIOGRAPHY N/A 01/01/2018   Procedure: LEFT HEART CATH AND CORONARY ANGIOGRAPHY;  Surgeon: Jettie Booze, MD;  Location: [facility] [facility];  Service:  Cardiovascular;  Laterality: N/A;   ORIF TIBIA & FIBULA FRACTURES Left 2003   Distal tibial/fibula    PERICARDIAL WINDOW  02/2007   PERICARDIOCENTESIS  2007   hx/notes 10/19/2011   POLYPECTOMY  10/12/2020   Procedure: POLYPECTOMY;  Surgeon: [name], MD;  [facility];  Service: Gastroenterology;;  small bowel, cecal    Family History: Family History  Problem Relation Age of Onset   CAD Mother        MI in 73s   Prostate cancer Father    Prostate cancer Brother    Lung cancer Brother     Social History: Social History   Tobacco Use  Smoking Status Former   Packs/day: 0.30   Years: 10.00   Pack years: 3.00   Types: Cigarettes   Start date: 08/06/1958   Quit date: 1968   Years since quitting: 54.8  Smokeless Tobacco Never   Social History   Substance and Sexual Activity  Alcohol Use Not Currently   Social History   Substance and Sexual Activity  Drug Use Never    Allergies: Allergies  Allergen Reactions   Amiodarone     rash    Medications: Current Outpatient Medications  Medication Sig Dispense Refill   allopurinol (ZYLOPRIM) 300 MG tablet Take 450 mg by mouth daily.      apixaban (ELIQUIS)  5 MG TABS tablet Take 1 tablet (5 mg total) by mouth 2 (two) times daily. 60 tablet 6   atorvastatin (LIPITOR) 80 MG tablet TAKE 1 TABLET BY MOUTH ONCE DAILY AT  6  PM 90 tablet 3   benazepril (LOTENSIN) 40 MG tablet Take 1 tablet by mouth once daily (Patient taking differently: Take 40 mg by mouth daily.) 90 tablet 3   diltiazem (CARDIZEM CD) 180 MG 24 hr capsule Take 1 capsule (180 mg total) by mouth daily. 30 capsule 1   FERREX 150 150 MG capsule Take 150 mg by mouth 2 (two) times daily.     hydrALAZINE (APRESOLINE) 100 MG tablet Take 100 mg by mouth 2 (two) times daily.     hydrochlorothiazide (HYDRODIURIL) 12.5 MG tablet Take 12.5 mg by mouth daily.     latanoprost (XALATAN) 0.005 % ophthalmic solution Place 1 drop into both eyes at  bedtime.     metFORMIN (GLUCOPHAGE-XR) 500 MG 24 hr tablet Take 500 mg by mouth 2 (two) times daily.     metoprolol succinate (TOPROL-XL) 50 MG 24 hr tablet Take 1 tablet (50 mg total) by mouth daily. Take with or immediately following a meal. 90 tablet 3   Multiple Vitamin (MULTIVITAMIN) tablet Take 1 tablet by mouth daily.     pantoprazole (PROTONIX) 40 MG tablet Take 1 tablet by mouth once daily 90 tablet 3   SIMBRINZA 1-0.2 % SUSP Place 1 drop into both eyes 2 (two) times daily.     vitamin C (ASCORBIC ACID) 500 MG tablet Take 500 mg by mouth 2 (two) times daily.      No current facility-administered medications for this visit.    Review of Systems: GENERAL: negative for malaise, night sweats HEENT: No changes in hearing or vision, no nose bleeds or other nasal problems. NECK: Negative for lumps, goiter, pain and significant neck swelling RESPIRATORY: Negative for cough, wheezing CARDIOVASCULAR: Negative for chest pain, leg swelling, palpitations, orthopnea GI: SEE HPI MUSCULOSKELETAL: Negative for joint pain or swelling, back pain, and muscle pain. SKIN: Negative for lesions, rash PSYCH: Negative for sleep disturbance, mood disorder and recent psychosocial stressors. HEMATOLOGY Negative for prolonged bleeding, bruising easily, and swollen nodes. ENDOCRINE: Negative for cold or heat intolerance, polyuria, polydipsia and goiter. NEURO: negative for tremor, gait imbalance, syncope and seizures. The remainder of the review of systems is noncontributory.   Physical Exam: BP (!) 162/68 (BP Location: Left Arm, Patient Position: Sitting, Cuff Size: Large)   Pulse 65   Temp 97.8 F (36.6 C) (Oral)   Ht 6' (1.829 m)   Wt 213 lb (96.6 kg)   BMI 28.89 kg/m  GENERAL: The patient is AO x3, in no acute distress. HEENT: Head is normocephalic and atraumatic. EOMI are intact. Mouth is well hydrated and without lesions. NECK: Supple. No masses LUNGS: Clear to auscultation. No presence of  rhonchi/wheezing/rales. Adequate chest expansion HEART: RRR, normal s1 and s2. ABDOMEN: Soft, nontender, no guarding, no peritoneal signs, and nondistended. BS +. No masses. EXTREMITIES: Without any cyanosis, clubbing, rash, lesions or edema. NEUROLOGIC: AOx3, no focal motor deficit. SKIN: no jaundice, no rashes  Imaging/Labs: as above  I personally reviewed and interpreted the available labs, imaging and endoscopic files.  Impression and Plan: BRAE GARTMAN is a 80 y.o. male  with PMH transverse colon adenocarcinoma, atrial fibrillation on Eliquis, coronary artery disease, gout, hypertension, prostate cancer status post seed implants, SVT, temporal arteritis, and history of H. pylori, who presents for follow up  of iron deficiency anemia and colon cancer.  Regarding his colon adenocarcinoma, he did not have any evidence of extraintestinal extension of this cancer to any other tissue.  Unfortunately, as it was considered that his polyp had only inflammatory components endoscopically and no major concerning alterations for malignancy, this was not tattooed.  Due to this, a, discussion was held to survey the area of polypectomy with a colonoscopy, for which she will be due this month.  He would like to have this scheduled after Thanksgiving.  At that time we will consider performing CA testing based on the endoscopic appearance of the area.  If he has recurrent abnormal tissue, this will be biopsied and tattooed, as well as it would be referred again to general surgery.  For for now, he should continue with the oral iron supplementation and he will need to fax the results of the upcoming CBC.  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  I did to this area in the past but would like to have our advanced endoscopist Dr. Rush Landmark reviewed the case and determine if he can remove it  endoscopically.  - Schedule colonoscopy -we will check CEA depending on endoscopic findings - Patient to fax report of upcoming CBC - Will discuss case with Dr. Rush Landmark regarding resection of jejunal adenoma  All questions were answered.      [name], MD Gastroenterology and Hepatology [facility]

## 2021-01-31 NOTE — Telephone Encounter (Signed)
[name] [name], CMA  

## 2021-02-01 ENCOUNTER — Encounter (INDEPENDENT_AMBULATORY_CARE_PROVIDER_SITE_OTHER): Payer: Self-pay

## 2021-02-01 ENCOUNTER — Telehealth: Payer: Self-pay

## 2021-02-01 ENCOUNTER — Other Ambulatory Visit: Payer: Self-pay

## 2021-02-01 ENCOUNTER — Telehealth: Payer: Self-pay | Admitting: *Deleted

## 2021-02-01 DIAGNOSIS — D132 Benign neoplasm of duodenum: Secondary | ICD-10-CM

## 2021-02-01 NOTE — Telephone Encounter (Signed)
-----   Message from [name], MD sent at 01/31/2021  5:04 PM EST ----- Regarding: RE: Proximal jejunum adenoma Hi [name],  Thanks so much. Sending a message to Chong Sicilian so she can help [name] with the scheduling of enteroscopy with EMR. He is OK waiting  and there is no rush from my side as well. He will be looking forward for the call to schedule the procedure. Thanks for updating about the FTRD, wasn't aware you were doing it but will keep you updated about it.  Thanks,  [name] ----- Message ----- From: Irving Copas., MD Sent: 01/31/2021   4:55 PM EST To: [name], MD Subject: RE: Proximal jejunum adenoma                   DCM, Happy to be of assistance. I am okay with this patient being scheduled as a direct enteroscopy with EMR.  Likely however based on my availability that this will not be performed until the end of December or beginning of January.  I suspect that that will be okay. If you end up finding that the patient does have any evidence of recurrent cancer at the site of your prior resection, certainly intact to and biopsied.  We can consider an FTRD if he were a nonsurgical candidate. Let me know what you decide with the patient and we can work on scheduling the enteroscopy. Thanks. GM ----- Message ----- From: [name], MD Sent: 01/31/2021   4:39 PM EST To: Irving Copas., MD Subject: Proximal jejunum adenoma                       Hi [name] this finds you well.  Just wanted to discuss this case with you.  There was a patient I did a push enteroscopy and a colonoscopy for significant iron deficiency anemia in the past.  He had the push performed in July and he was found to have 2 nonbleeding adenomas, one in the duodenum and 1 in the proximal jejunum.  I removed the 1 in the 4 portion of the duodenum but the jejunal had a slight depression (biopsies were only positive for tubular adenoma) and did not lift with  Eleview.  Wanted to discuss with you if you felt you could give it a try and possibly remove it.  The patient is not super healthy but may have a decent lifespan.  Please let me know your thoughts.  [name] [name]

## 2021-02-01 NOTE — Telephone Encounter (Signed)
Mansouraty, Telford Nab., MD  [name], MD Cc: Timothy Lasso, RN Jaron Czarnecki,  Please schedule this patient with me Enteroscopy with EMR 75 minute case.  I can see patient in clinic if he desires or if he wants a direct procedure that is fine as well.  Thanks.  GM

## 2021-02-01 NOTE — Telephone Encounter (Signed)
-----   Message from Satira Sark, MD sent at 02/01/2021 12:05 PM EST ----- Would plan to hold Eliquis 48 hours prior to planned GI procedure. ----- Message ----- From: Timothy Lasso, RN Sent: 02/01/2021  10:30 AM EST To: Satira Sark, MD

## 2021-02-01 NOTE — Telephone Encounter (Signed)
The pt has been scheduled for Enteroscopy EMR on 04/11/21 at 730 am at [facility] with GM.  Anti coag letter has been sent to Dr Domenic Polite.    Enteroscopy EMR scheduled, pt and wife instructed and medications reviewed.  Patient instructions mailed to home.  Patient to call with any questions or concerns.   Will follow up with Dr Domenic Polite regarding Eliquis.

## 2021-02-01 NOTE — Telephone Encounter (Signed)
Clinical pharmacist to review Eliquis 

## 2021-02-01 NOTE — Telephone Encounter (Signed)
   [facility] Pre-operative Risk Assessment    Patient Name: EUAL LINDSTROM  DOB: Oct 07, 1940 MRN: [id number]  HEARTCARE STAFF:  - IMPORTANT!!!!!! Under Visit Info/Reason for Call, type in Other and utilize the format Clearance MM/DD/YY or Clearance TBD. Do not use dashes or single digits. - Please review there is not already an duplicate clearance open for this procedure. - If request is for dental extraction, please clarify the # of teeth to be extracted. - If the patient is currently at the dentist's office, call Pre-Op Callback Staff (MA/nurse) to input urgent request.  - If the patient is not currently in the dentist office, please route to the Pre-Op pool.  Request for surgical clearance:  What type of surgery is being performed?  COLONOSCOPY  When is this surgery scheduled?  02/25/21  What type of clearance is required (medical clearance vs. Pharmacy clearance to hold med vs. Both)?  PHARMACY  Are there any medications that need to be held prior to surgery and how long?  ELIQUIS X'S 2 DAYS  Practice name and name of physician performing surgery?  [facility] / DR. CASTANEDA  What is the office phone number?  [phone]   7.   What is the office fax number?  [phone]  8.   Anesthesia type (None, local, MAC, general) ?  MAC   Jeanann Lewandowsky 02/01/2021, 11:17 AM  _________________________________________________________________   (provider comments below)

## 2021-02-01 NOTE — Telephone Encounter (Signed)
Patient with diagnosis of afib on Eliquis for anticoagulation.    Procedure: colonoscopy Date of procedure: 02/25/21  CHA2DS2-VASc Score = 5  This indicates a 7.2% annual risk of stroke. The patient's score is based upon: CHF History: 0 HTN History: 1 Diabetes History: 1 Stroke History: 0 Vascular Disease History: 1 Age Score: 2 Gender Score: 0   CrCl 84.74 mL/min Platelet count 143K  Per office protocol, patient can hold Eliquis for 2 days prior to procedure.

## 2021-02-01 NOTE — Telephone Encounter (Signed)
The pt and his wife have been advised of the 48 hour hold of Eliquis

## 2021-02-02 NOTE — Telephone Encounter (Signed)
    Patient Name: [name] [name]  DOB: [birth year] MRN: [id number]  Primary Cardiologist: Rozann Lesches, MD  Chart reviewed as part of pre-operative protocol coverage. Patient has colonoscopy scheduled for 02/25/2021 and we were asked to give our recommendations for holding Eliquis. Per Pharmacy and office protocol, patient can hold Eliquis for 2 days prior to procedure. Please restart this as soon as safely possible following colonoscopy.   I will route this recommendation to the requesting party via Epic fax function and remove from pre-op pool.  Please call with questions.  Darreld Mclean, PA-C 02/02/2021, 10:41 AM

## 2021-02-07 ENCOUNTER — Telehealth (INDEPENDENT_AMBULATORY_CARE_PROVIDER_SITE_OTHER): Payer: Self-pay | Admitting: Gastroenterology

## 2021-02-07 NOTE — Telephone Encounter (Signed)
I received the results of most recent blood testing performed on 12/01/2020 showing a CBC with a hemoglobin of 13.0, will also count of 5.7 and platelets of 162.  Normal iron stores with total iron of 80, ferritin of 50, iron saturation of 31%.  I did not receive any other blood tests.  Overall stable, we will evaluate area of polypectomy where the cancer was found with a repeat colonoscopy next month.

## 2021-02-09 ENCOUNTER — Encounter (INDEPENDENT_AMBULATORY_CARE_PROVIDER_SITE_OTHER): Payer: Self-pay

## 2021-02-22 NOTE — Patient Instructions (Signed)
[name] [name]  02/22/2021     @PREFPERIOPPHARMACY @   Your procedure is scheduled on  02/25/2021.   Report to [facility] at  0700  A.M.   Call this number if you have problems the morning of surgery:  [phone]   Remember:  Follow the diet and prep instructions given to you by the office.     Your last dose of eliquis should be 02/22/2021 and your last dose of iron should be 02/14/2021.    Take these medicines the morning of surgery with A SIP OF WATER      allopurinol, diltiazem, metoprolol, protonix.     Do not wear jewelry, make-up or nail polish.  Do not wear lotions, powders, or perfumes, or deodorant.  Do not shave 48 hours prior to surgery.  Men may shave face and neck.  Do not bring valuables to the hospital.  [facility] is not responsible for any belongings or valuables.  Contacts, dentures or bridgework may not be worn into surgery.  Leave your suitcase in the car.  After surgery it may be brought to your room.  For patients admitted to the hospital, discharge time will be determined by your treatment team.  Patients discharged the day of surgery will not be allowed to drive home and must  have someone with them for 24 hours.    Special instructions:   DO NOT smoke tobacco or vape for 24 hours before your procedure.  Please read over the following fact sheets that you were given. Anesthesia Post-op Instructions and Care and Recovery After Surgery      Colonoscopy, Adult, Care After This sheet gives you information about how to care for yourself after your procedure. Your health care provider may also give you more specific instructions. If you have problems or questions, contact your health care provider. What can I expect after the procedure? After the procedure, it is common to have: A small amount of blood in your stool for 24 hours after the procedure. Some gas. Mild cramping or bloating of your abdomen. Follow these instructions at  home: Eating and drinking  Drink enough fluid to keep your urine pale yellow. Follow instructions from your health care provider about eating or drinking restrictions. Resume your normal diet as instructed by your health care provider. Avoid heavy or fried foods that are hard to digest. Activity Rest as told by your health care provider. Avoid sitting for a long time without moving. Get up to take short walks every 1-2 hours. This is important to improve blood flow and breathing. Ask for help if you feel weak or unsteady. Return to your normal activities as told by your health care provider. Ask your health care provider what activities are safe for you. Managing cramping and bloating  Try walking around when you have cramps or feel bloated. Apply heat to your abdomen as told by your health care provider. Use the heat source that your health care provider recommends, such as a moist heat pack or a heating pad. Place a towel between your skin and the heat source. Leave the heat on for 20-30 minutes. Remove the heat if your skin turns bright red. This is especially important if you are unable to feel pain, heat, or cold. You may have a greater risk of getting burned. General instructions If you were given a sedative during the procedure, it can affect you for several hours. Do not drive or operate machinery until  your health care provider says that it is safe. For the first 24 hours after the procedure: Do not sign important documents. Do not drink alcohol. Do your regular daily activities at a slower pace than normal. Eat soft foods that are easy to digest. Take over-the-counter and prescription medicines only as told by your health care provider. Keep all follow-up visits as told by your health care provider. This is important. Contact a health care provider if: You have blood in your stool 2-3 days after the procedure. Get help right away if you have: More than a small spotting of blood  in your stool. Large blood clots in your stool. Swelling of your abdomen. Nausea or vomiting. A fever. Increasing pain in your abdomen that is not relieved with medicine. Summary After the procedure, it is common to have a small amount of blood in your stool. You may also have mild cramping and bloating of your abdomen. If you were given a sedative during the procedure, it can affect you for several hours. Do not drive or operate machinery until your health care provider says that it is safe. Get help right away if you have a lot of blood in your stool, nausea or vomiting, a fever, or increased pain in your abdomen. This information is not intended to replace advice given to you by your health care provider. Make sure you discuss any questions you have with your health care provider. Document Revised: 01/17/2019 Document Reviewed: 10/07/2018 Elsevier Patient Education  [place] After This sheet gives you information about how to care for yourself after your procedure. Your health care provider may also give you more specific instructions. If you have problems or questions, contact your health care provider. What can I expect after the procedure? After the procedure, it is common to have: Tiredness. Forgetfulness about what happened after the procedure. Impaired judgment for important decisions. Nausea or vomiting. Some difficulty with balance. Follow these instructions at home: For the time period you were told by your health care provider:   Rest as needed. Do not participate in activities where you could fall or become injured. Do not drive or use machinery. Do not drink alcohol. Do not take sleeping pills or medicines that cause drowsiness. Do not make important decisions or sign legal documents. Do not take care of children on your own. Eating and drinking Follow the diet that is recommended by your health care provider. Drink enough  fluid to keep your urine pale yellow. If you vomit: Drink water, juice, or soup when you can drink without vomiting. Make sure you have little or no nausea before eating solid foods. General instructions Have a responsible adult stay with you for the time you are told. It is important to have someone help care for you until you are awake and alert. Take over-the-counter and prescription medicines only as told by your health care provider. If you have sleep apnea, surgery and certain medicines can increase your risk for breathing problems. Follow instructions from your health care provider about wearing your sleep device: Anytime you are sleeping, including during daytime naps. While taking prescription pain medicines, sleeping medicines, or medicines that make you drowsy. Avoid smoking. Keep all follow-up visits as told by your health care provider. This is important. Contact a health care provider if: You keep feeling nauseous or you keep vomiting. You feel light-headed. You are still sleepy or having trouble with balance after 24 hours. You develop a rash.  You have a fever. You have redness or swelling around the IV site. Get help right away if: You have trouble breathing. You have new-onset confusion at home. Summary For several hours after your procedure, you may feel tired. You may also be forgetful and have poor judgment. Have a responsible adult stay with you for the time you are told. It is important to have someone help care for you until you are awake and alert. Rest as told. Do not drive or operate machinery. Do not drink alcohol or take sleeping pills. Get help right away if you have trouble breathing, or if you suddenly become confused. This information is not intended to replace advice given to you by your health care provider. Make sure you discuss any questions you have with your health care provider. Document Revised: 11/27/2019 Document Reviewed: 02/13/2019 Elsevier  Patient Education  2022 [employer].

## 2021-02-23 ENCOUNTER — Encounter (HOSPITAL_COMMUNITY)
Admission: RE | Admit: 2021-02-23 | Discharge: 2021-02-23 | Disposition: A | Discharge status: Home / Self Care | Payer: Medicare HMO | Source: Ambulatory Visit | Attending: Gastroenterology | Admitting: Gastroenterology

## 2021-02-23 ENCOUNTER — Encounter (HOSPITAL_COMMUNITY): Payer: Self-pay

## 2021-02-23 ENCOUNTER — Other Ambulatory Visit: Payer: Self-pay

## 2021-02-23 VITALS — BP 141/69 | HR 50 | Temp 98.4°F | Resp 18 | Ht 72.0 in | Wt 211.0 lb

## 2021-02-23 DIAGNOSIS — Z01812 Encounter for preprocedural laboratory examination: Secondary | ICD-10-CM | POA: Diagnosis not present

## 2021-02-23 DIAGNOSIS — D509 Iron deficiency anemia, unspecified: Secondary | ICD-10-CM

## 2021-02-23 DIAGNOSIS — E119 Type 2 diabetes mellitus without complications: Secondary | ICD-10-CM | POA: Diagnosis not present

## 2021-02-23 DIAGNOSIS — D500 Iron deficiency anemia secondary to blood loss (chronic): Secondary | ICD-10-CM | POA: Diagnosis not present

## 2021-02-23 DIAGNOSIS — E1122 Type 2 diabetes mellitus with diabetic chronic kidney disease: Secondary | ICD-10-CM | POA: Diagnosis not present

## 2021-02-23 DIAGNOSIS — I129 Hypertensive chronic kidney disease with stage 1 through stage 4 chronic kidney disease, or unspecified chronic kidney disease: Secondary | ICD-10-CM | POA: Diagnosis not present

## 2021-02-23 DIAGNOSIS — I482 Chronic atrial fibrillation, unspecified: Secondary | ICD-10-CM | POA: Diagnosis not present

## 2021-02-23 DIAGNOSIS — N182 Chronic kidney disease, stage 2 (mild): Secondary | ICD-10-CM | POA: Diagnosis not present

## 2021-02-23 LAB — CBC WITH DIFFERENTIAL/PLATELET
Abs Immature Granulocytes: 0.03 10*3/uL (ref 0.00–0.07)
Basophils Absolute: 0 10*3/uL (ref 0.0–0.1)
Basophils Relative: 1 %
Eosinophils Absolute: 0.1 10*3/uL (ref 0.0–0.5)
Eosinophils Relative: 1 %
HCT: 36.6 % — ABNORMAL LOW (ref 39.0–52.0)
Hemoglobin: 12.1 g/dL — ABNORMAL LOW (ref 13.0–17.0)
Immature Granulocytes: 1 %
Lymphocytes Relative: 18 %
Lymphs Abs: 1.1 10*3/uL (ref 0.7–4.0)
MCH: 31.4 pg (ref 26.0–34.0)
MCHC: 33.1 g/dL (ref 30.0–36.0)
MCV: 95.1 fL (ref 80.0–100.0)
Monocytes Absolute: 0.6 10*3/uL (ref 0.1–1.0)
Monocytes Relative: 9 %
Neutro Abs: 4.4 10*3/uL (ref 1.7–7.7)
Neutrophils Relative %: 70 %
Platelets: 163 10*3/uL (ref 150–400)
RBC: 3.85 MIL/uL — ABNORMAL LOW (ref 4.22–5.81)
RDW: 14.4 % (ref 11.5–15.5)
WBC: 6.2 10*3/uL (ref 4.0–10.5)
nRBC: 0 % (ref 0.0–0.2)

## 2021-02-23 LAB — BASIC METABOLIC PANEL
Anion gap: 9 (ref 5–15)
BUN: 20 mg/dL (ref 8–23)
CO2: 23 mmol/L (ref 22–32)
Calcium: 9.4 mg/dL (ref 8.9–10.3)
Chloride: 106 mmol/L (ref 98–111)
Creatinine, Ser: 0.84 mg/dL (ref 0.61–1.24)
GFR, Estimated: >60 mL/min (ref >60)
Glucose, Bld: 94 mg/dL (ref 70–99)
Potassium: 3.6 mmol/L (ref 3.5–5.1)
Sodium: 138 mmol/L (ref 135–145)

## 2021-02-25 ENCOUNTER — Ambulatory Visit (HOSPITAL_COMMUNITY): Payer: Medicare HMO | Admitting: Anesthesiology

## 2021-02-25 ENCOUNTER — Other Ambulatory Visit: Payer: Self-pay

## 2021-02-25 ENCOUNTER — Encounter (HOSPITAL_COMMUNITY): Payer: Self-pay | Admitting: Gastroenterology

## 2021-02-25 ENCOUNTER — Encounter (HOSPITAL_COMMUNITY)
Admission: RE | Disposition: A | Discharge status: Home / Self Care | Payer: Self-pay | Source: Home / Self Care | Attending: Gastroenterology

## 2021-02-25 ENCOUNTER — Ambulatory Visit (HOSPITAL_COMMUNITY)
Admission: RE | Admit: 2021-02-25 | Discharge: 2021-02-25 | Disposition: A | Discharge status: Home / Self Care | Payer: Medicare HMO | Attending: Gastroenterology | Admitting: Gastroenterology

## 2021-02-25 DIAGNOSIS — C186 Malignant neoplasm of descending colon: Secondary | ICD-10-CM | POA: Diagnosis not present

## 2021-02-25 DIAGNOSIS — I2510 Atherosclerotic heart disease of native coronary artery without angina pectoris: Secondary | ICD-10-CM | POA: Insufficient documentation

## 2021-02-25 DIAGNOSIS — C189 Malignant neoplasm of colon, unspecified: Secondary | ICD-10-CM | POA: Diagnosis not present

## 2021-02-25 DIAGNOSIS — Z08 Encounter for follow-up examination after completed treatment for malignant neoplasm: Secondary | ICD-10-CM

## 2021-02-25 DIAGNOSIS — K635 Polyp of colon: Secondary | ICD-10-CM | POA: Diagnosis not present

## 2021-02-25 DIAGNOSIS — Z955 Presence of coronary angioplasty implant and graft: Secondary | ICD-10-CM | POA: Diagnosis not present

## 2021-02-25 DIAGNOSIS — I480 Paroxysmal atrial fibrillation: Secondary | ICD-10-CM | POA: Insufficient documentation

## 2021-02-25 DIAGNOSIS — Z85038 Personal history of other malignant neoplasm of large intestine: Secondary | ICD-10-CM | POA: Diagnosis not present

## 2021-02-25 DIAGNOSIS — Z1211 Encounter for screening for malignant neoplasm of colon: Secondary | ICD-10-CM | POA: Diagnosis not present

## 2021-02-25 DIAGNOSIS — K219 Gastro-esophageal reflux disease without esophagitis: Secondary | ICD-10-CM | POA: Diagnosis not present

## 2021-02-25 DIAGNOSIS — I10 Essential (primary) hypertension: Secondary | ICD-10-CM | POA: Insufficient documentation

## 2021-02-25 DIAGNOSIS — D124 Benign neoplasm of descending colon: Secondary | ICD-10-CM | POA: Diagnosis not present

## 2021-02-25 DIAGNOSIS — D630 Anemia in neoplastic disease: Secondary | ICD-10-CM | POA: Insufficient documentation

## 2021-02-25 DIAGNOSIS — I252 Old myocardial infarction: Secondary | ICD-10-CM | POA: Diagnosis not present

## 2021-02-25 DIAGNOSIS — D123 Benign neoplasm of transverse colon: Secondary | ICD-10-CM | POA: Insufficient documentation

## 2021-02-25 DIAGNOSIS — M1990 Unspecified osteoarthritis, unspecified site: Secondary | ICD-10-CM | POA: Diagnosis not present

## 2021-02-25 DIAGNOSIS — Z87891 Personal history of nicotine dependence: Secondary | ICD-10-CM | POA: Insufficient documentation

## 2021-02-25 HISTORY — PX: POLYPECTOMY: SHX5525

## 2021-02-25 HISTORY — PX: COLONOSCOPY WITH PROPOFOL: SHX5780

## 2021-02-25 HISTORY — PX: BIOPSY: SHX5522

## 2021-02-25 LAB — GLUCOSE, CAPILLARY: Glucose-Capillary: 114 mg/dL — ABNORMAL HIGH (ref 70–99)

## 2021-02-25 SURGERY — COLONOSCOPY WITH PROPOFOL
Anesthesia: General

## 2021-02-25 MED ORDER — LIDOCAINE HCL (PF) 2 % IJ SOLN
INTRAMUSCULAR | Status: AC
  Filled 2021-02-25: qty 5

## 2021-02-25 MED ORDER — PHENYLEPHRINE 40 MCG/ML (10ML) SYRINGE FOR IV PUSH (FOR BLOOD PRESSURE SUPPORT)
PREFILLED_SYRINGE | INTRAVENOUS | Status: AC
  Filled 2021-02-25: qty 10

## 2021-02-25 MED ORDER — EPHEDRINE SULFATE 50 MG/ML IJ SOLN
INTRAMUSCULAR | Status: DC | PRN
  Administered 2021-02-25 (×3): 5 mg via INTRAVENOUS

## 2021-02-25 MED ORDER — PROPOFOL 500 MG/50ML IV EMUL
INTRAVENOUS | Status: DC | PRN
  Administered 2021-02-25: 150 ug/kg/min via INTRAVENOUS

## 2021-02-25 MED ORDER — SPOT INK MARKER SYRINGE KIT
PACK | SUBMUCOSAL | Status: AC
  Filled 2021-02-25: qty 5

## 2021-02-25 MED ORDER — LACTATED RINGERS IV SOLN: INTRAVENOUS | Status: DC | PRN

## 2021-02-25 MED ORDER — LIDOCAINE HCL (CARDIAC) PF 100 MG/5ML IV SOSY
PREFILLED_SYRINGE | INTRAVENOUS | Status: DC | PRN
  Administered 2021-02-25: 50 mg via INTRAVENOUS

## 2021-02-25 MED ORDER — SPOT INK MARKER SYRINGE KIT
PACK | SUBMUCOSAL | Status: DC | PRN
  Administered 2021-02-25: 3 mL via SUBMUCOSAL

## 2021-02-25 MED ORDER — EPHEDRINE 5 MG/ML INJ
INTRAVENOUS | Status: AC
  Filled 2021-02-25: qty 5

## 2021-02-25 NOTE — Discharge Instructions (Addendum)
You are being discharged to home.  Resume your previous diet.  We are waiting for your pathology results.  Your physician has recommended a repeat colonoscopy for surveillance based on pathology results and treatment plan for malignancy, possibly in a year.  Restart Eliquis tomorrow AM

## 2021-02-25 NOTE — Op Note (Signed)
[facility] Patient Name: [name] [name] Procedure Date: 02/25/2021 8:34 AM MRN: [id number] Date of Birth: 11-19-1940 Attending MD: Maylon Peppers ,  CSN: [id number] Age: 80 Admit Type: Outpatient Procedure:                Colonoscopy Indications:              Follow-up of colon cancer Providers:                Maylon Peppers, Janeece Riggers, RN, Raphael Gibney,                            Technician Referring MD:              Medicines:                Monitored Anesthesia Care Complications:            No immediate complications. Estimated Blood Loss:     Estimated blood loss: none. Procedure:                Pre-Anesthesia Assessment:                           - Prior to the procedure, a History and Physical                            was performed, and patient medications, allergies                            and sensitivities were reviewed. The patient's                            tolerance of previous anesthesia was reviewed.                           - The risks and benefits of the procedure and the                            sedation options and risks were discussed with the                            patient. All questions were answered and informed                            consent was obtained.                           - ASA Grade Assessment: III - A patient with severe                            systemic disease.                           - Adequate visualization was aided with the use of                            a transparent cap attached to the distal part of  the endoscope.                           After obtaining informed consent, the colonoscope                            was passed under direct vision. Throughout the                            procedure, the patient's blood pressure, pulse, and                            oxygen saturations were monitored continuously. The                            [id number]-[id number] ([id number]) scope was  introduced through                            the anus and advanced to the the cecum, identified                            by appendiceal orifice and ileocecal valve. The                            colonoscopy was performed without difficulty. The                            patient tolerated the procedure well. Scope In: 8:49:17 AM Scope Out: 9:18:56 AM Scope Withdrawal Time: 0 hours 25 minutes 50 seconds  Total Procedure Duration: 0 hours 29 minutes 39 seconds  Findings:      The perianal and digital rectal examinations were normal.      Three semi-sessile polyps were found in the transverse colon. The polyps       were 3 to 5 mm in size. These polyps were removed with a cold snare.       Resection and retrieval were complete.      A 10 mm polyp was found in the descending colon at 54 cm proximal to the       anus. The polyp was Paris classification III (excavated) and found to be       Delphi Type V (as viewed with advanced imaging). This likely       corresponds to the area of malignancy found on previous colonoscopy.       Biopsies were taken with a cold forceps for histology. An area distal to       the lesion was successfully injected with 3 mL [place] ink for tattooing. Impression:               - Three 3 to 5 mm polyps in the transverse colon,                            removed with a cold snare. Resected and retrieved.                           - One 10 mm polyp in the descending colon  at 54 cm                            proximal to the anus. Biopsied. Injected. Moderate Sedation:      Per Anesthesia Care Recommendation:           - Discharge patient to home (ambulatory).                           - Resume previous diet.                           - Await pathology results.                           - Repeat colonoscopy for surveillance based on                            pathology results and treatment plan for                            malignancy, possibly in a  year. Procedure Code(s):        --- Professional ---                           [phone], Colonoscopy, flexible; with removal of                            tumor(s), polyp(s), or other lesion(s) by snare                            technique                           45381, Colonoscopy, flexible; with directed                            submucosal injection(s), any substance                           21194, 44, Colonoscopy, flexible; with biopsy,                            single or multiple Diagnosis Code(s):        --- Professional ---                           K63.5, Polyp of colon                           C18.9, Malignant neoplasm of colon, unspecified CPT copyright 2019 American Medical Association. All rights reserved. The codes documented in this report are preliminary and upon coder review may  be revised to meet current compliance requirements. Maylon Peppers, MD Maylon Peppers,  02/25/2021 9:28:26 AM This report has been signed electronically. Number of Addenda: 0

## 2021-02-25 NOTE — Anesthesia Preprocedure Evaluation (Addendum)
Anesthesia Evaluation  Patient identified by MRN, date of birth, ID band Patient awake    Reviewed: Allergy & Precautions, H&P , NPO status , Patient's Chart, lab work & pertinent test results, reviewed documented beta blocker date and time   Airway Mallampati: II  TM Distance: >3 FB Neck ROM: full    Dental no notable dental hx.    Pulmonary neg pulmonary ROS, former smoker,    Pulmonary exam normal breath sounds clear to auscultation       Cardiovascular Exercise Tolerance: Good hypertension, + CAD, + Past MI and + Cardiac Stents  + dysrhythmias Atrial Fibrillation  Rhythm:irregular Rate:Normal     Neuro/Psych  Headaches, negative psych ROS   GI/Hepatic Neg liver ROS, GERD  Medicated,  Endo/Other  negative endocrine ROS  Renal/GU negative Renal ROS  negative genitourinary   Musculoskeletal  (+) Arthritis ,   Abdominal   Peds  Hematology  (+) Blood dyscrasia, anemia ,   Anesthesia Other Findings ? Mid Cx lesion is 95% stenosed. ? Prox LAD lesion is 25% stenosed. ? Mid LAD lesion is 30% stenosed. ? Prox RCA to Mid RCA lesion is 50% stenosed. ? The left ventricular systolic function is normal. ? LV end diastolic pressure is normal. LVEDP 12 mm Hg. ? The left ventricular ejection fraction is 50-55% by visual estimate. ? There is no aortic valve stenosis. ? Severe tortuosity in the right subclavian which makes torquing catheters difficult. Would consider femoral approach in the future, particularly if emergent cardiac cath was needed. ? A drug-eluting stent was successfully placed using a STENT SIERRA 3.25 X 15 MM. ? Post intervention, there is a 0% residual stenosis.   Reproductive/Obstetrics negative OB ROS                            Anesthesia Physical Anesthesia Plan  ASA: 3  Anesthesia Plan: General   Post-op Pain Management:    Induction:   PONV Risk Score and Plan: Propofol  infusion  Airway Management Planned:   Additional Equipment:   Intra-op Plan:   Post-operative Plan:   Informed Consent: I have reviewed the patients History and Physical, chart, labs and discussed the procedure including the risks, benefits and alternatives for the proposed anesthesia with the patient or authorized representative who has indicated his/her understanding and acceptance.     Dental Advisory Given  Plan Discussed with: CRNA  Anesthesia Plan Comments:        Anesthesia Quick Evaluation

## 2021-02-25 NOTE — Interval H&P Note (Signed)
History and Physical Interval Note:  02/25/2021 7:38 AM  [name] [name]  has presented today for surgery, with the diagnosis of Colon Cancer.  The various methods of treatment have been discussed with the patient and family. After consideration of risks, benefits and other options for treatment, the patient has consented to  Procedure(s) with comments: COLONOSCOPY WITH PROPOFOL (N/A) - 8:10 as a surgical intervention.  The patient's history has been reviewed, patient examined, no change in status, stable for surgery.  I have reviewed the patient's chart and labs.  Questions were answered to the patient's satisfaction.     [name]

## 2021-02-25 NOTE — Anesthesia Postprocedure Evaluation (Signed)
Anesthesia Post Note  Patient: [name] [name]  Procedure(s) Performed: COLONOSCOPY WITH PROPOFOL POLYPECTOMY BIOPSY  Patient location during evaluation: Phase II Anesthesia Type: General Level of consciousness: awake Pain management: pain level controlled Vital Signs Assessment: post-procedure vital signs reviewed and stable Respiratory status: spontaneous breathing and respiratory function stable Cardiovascular status: blood pressure returned to baseline and stable Postop Assessment: no headache and no apparent nausea or vomiting Anesthetic complications: no Comments: Late entry   No notable events documented.   Last Vitals:  Vitals:   02/25/21 0748 02/25/21 0923  BP: (!) 177/61 (!) 139/52  Pulse: (!) 53 (!) 59  Resp: 13 16  Temp: 36.4 C   SpO2: 97% 96%    Last Pain:  Vitals:   02/25/21 0923  TempSrc: Oral  PainSc: 0-No pain                 Louann Sjogren

## 2021-02-25 NOTE — Transfer of Care (Signed)
Immediate Anesthesia Transfer of Care Note  Patient: [name] [name]  Procedure(s) Performed: COLONOSCOPY WITH PROPOFOL POLYPECTOMY BIOPSY  Patient Location: PACU  Anesthesia Type:General  Level of Consciousness: awake, alert , oriented and patient cooperative  Airway & Oxygen Therapy: Patient Spontanous Breathing  Post-op Assessment: Report given to RN, Post -op Vital signs reviewed and stable and Patient moving all extremities X 4  Post vital signs: Reviewed and stable  Last Vitals:  Vitals Value Taken Time  BP 139/52 02/25/21 0923  Temp    Pulse 59 02/25/21 0923  Resp 16 02/25/21 0923  SpO2 96 % 02/25/21 0923    Last Pain:  Vitals:   02/25/21 0923  TempSrc: Oral  PainSc: 0-No pain         Complications: No notable events documented.

## 2021-03-01 ENCOUNTER — Other Ambulatory Visit (INDEPENDENT_AMBULATORY_CARE_PROVIDER_SITE_OTHER): Payer: Self-pay | Admitting: Gastroenterology

## 2021-03-01 DIAGNOSIS — C186 Malignant neoplasm of descending colon: Secondary | ICD-10-CM

## 2021-03-01 LAB — SURGICAL PATHOLOGY

## 2021-03-02 ENCOUNTER — Encounter (HOSPITAL_COMMUNITY): Payer: Self-pay | Admitting: Gastroenterology

## 2021-03-07 NOTE — Progress Notes (Signed)
[facility] [address] [place] [address]   CLINIC:  Medical Oncology/Hematology  CONSULT NOTE  Patient Care Team: Caryl Bis, MD as PCP - General (Unknown Physician Specialty) Satira Sark, MD as PCP - Cardiology (Cardiology)  CHIEF COMPLAINTS/PURPOSE OF CONSULTATION:  Evaluation of colon cancer  HISTORY OF PRESENTING ILLNESS:  [name] 80 y.o. male is here because of evaluation of colon cancer, at the request of Dr. Jenetta Downer.  Today he reports feeling good, and he is accompanied by his wife. He denies hematochezia; he reports dark stools since starting iron tablets BID 4-5 years ago. He has a history of prostate cancer starting in 2007. He denies difficulty urinating, diarrhea, new pains, recent weight loss. He walks 2 miles every morning and has good activity levels. He reports difficulty with balance over the past year. He is currently taking Eliquis due to history of A-fib.   Prior to retirement he worked in [employer] broadcast [employer]. He quit smoking about 40 years ago after smoking 1 ppd for 7 years. His father had prostate cancer, his brother and his sister had lung cancer, his other brother had colon cancer, and another sister had an unspecified cancer.   MEDICAL HISTORY:  Past Medical History:  Diagnosis Date   Anemia    Arthritis    CAD (coronary artery disease)    DES to circumflex 12/2017   GERD (gastroesophageal reflux disease)    Glaucoma    Gout    Headache    History of kidney stones    Hypertension    NSTEMI (non-ST elevated myocardial infarction) ([place]) 12/31/2017   Paroxysmal atrial fibrillation ([facility])    Pericardial effusion    Idiopathic, recurrent pericardial effusion s/p pericardial window.   Prostate cancer ([place] [place]) 2007   S/P Seed implants    Supraventricular tachycardia ([place])    Temporal arteritis ([place])     SURGICAL HISTORY: Past Surgical History:  Procedure Laterality Date   BIOPSY  10/12/2020    Procedure: BIOPSY;  Surgeon: [name], MD;  [facility];  Service: Gastroenterology;;   BIOPSY  02/25/2021   Procedure: BIOPSY;  Surgeon: [name], MD;  [facility];  Service: Gastroenterology;;   CATARACT EXTRACTION W/PHACO Left 06/07/2015   Procedure: CATARACT EXTRACTION PHACO AND INTRAOCULAR LENS PLACEMENT LEFT EYE CDE=8.00;  Surgeon: Tonny Branch, MD;  Location: [facility];  Service: Ophthalmology;  Laterality: Left;   CATARACT EXTRACTION W/PHACO Right 06/17/2015   Procedure: CATARACT EXTRACTION PHACO AND INTRAOCULAR LENS PLACEMENT RIGHT EYE CDE=11.09;  Surgeon: Tonny Branch, MD;  Location: [facility];  Service: Ophthalmology;  Laterality: Right;   COLONOSCOPY WITH PROPOFOL N/A 10/12/2020   Procedure: COLONOSCOPY WITH PROPOFOL;  Surgeon: [name], MD;  [facility];  Service: Gastroenterology;  Laterality: N/A;  10:35   COLONOSCOPY WITH PROPOFOL N/A 02/25/2021   Procedure: COLONOSCOPY WITH PROPOFOL;  Surgeon: [name], MD;  [facility];  Service: Gastroenterology;  Laterality: N/A;  8:10   CORONARY ANGIOPLASTY WITH STENT PLACEMENT  01/01/2018   CORONARY STENT INTERVENTION N/A 01/01/2018   Procedure: CORONARY STENT INTERVENTION;  Surgeon: Jettie Booze, MD;  Location: [facility];  Service: Cardiovascular;  Laterality: N/A;   ENTEROSCOPY N/A 10/12/2020   Procedure: PUSH ENTEROSCOPY;  Surgeon: [name], MD;  [facility];  Service: Gastroenterology;  Laterality: N/A;   ESOPHAGOGASTRODUODENOSCOPY (EGD) WITH PROPOFOL N/A 10/12/2020   Procedure: ESOPHAGOGASTRODUODENOSCOPY (EGD) WITH  PROPOFOL;  Surgeon: Montez Morita, [name], MD;  [facility];  Service: Gastroenterology;  Laterality: N/A;   FRACTURE SURGERY Left 2010   ankle   INSERTION PROSTATE RADIATION SEED  2007   KNEE ARTHROSCOPY Left    LEFT HEART CATH AND CORONARY ANGIOGRAPHY N/A  01/01/2018   Procedure: LEFT HEART CATH AND CORONARY ANGIOGRAPHY;  Surgeon: Jettie Booze, MD;  Location: [facility];  Service: Cardiovascular;  Laterality: N/A;   ORIF TIBIA & FIBULA FRACTURES Left 2003   Distal tibial/fibula    PERICARDIAL WINDOW  02/2007   PERICARDIOCENTESIS  2007   hx/notes 10/19/2011   POLYPECTOMY  10/12/2020   Procedure: POLYPECTOMY;  Surgeon: [name], MD;  [facility];  Service: Gastroenterology;;  small bowel, cecal   POLYPECTOMY  02/25/2021   Procedure: POLYPECTOMY;  Surgeon: [name], MD;  [facility];  Service: Gastroenterology;;  transverse colon x2    SOCIAL HISTORY: Social History   Socioeconomic History   Marital status: Married    Spouse name: Not on file   Number of children: Not on file   Years of education: Not on file   Highest education level: Not on file  Occupational History   Not on file  Tobacco Use   Smoking status: Former    Packs/day: 0.30    Years: 10.00    Pack years: 3.00    Types: Cigarettes    Start date: 08/06/1958    Quit date: 1968    Years since quitting: 54.9   Smokeless tobacco: Never  Vaping Use   Vaping Use: Never used  Substance and Sexual Activity   Alcohol use: Not Currently   Drug use: Never   Sexual activity: Yes  Other Topics Concern   Not on file  Social History Narrative   Not on file   Social Determinants of Health   Financial Resource Strain: Not on file  Food Insecurity: Not on file  Transportation Needs: Not on file  Physical Activity: Not on file  Stress: Not on file  Social Connections: Not on file  Intimate Partner Violence: Not on file    FAMILY HISTORY: Family History  Problem Relation Age of Onset   CAD Mother        MI in 62s   Prostate cancer Father    Prostate cancer Brother    Lung cancer Brother     ALLERGIES:  is allergic to amiodarone.  MEDICATIONS:  Current Outpatient Medications  Medication Sig  Dispense Refill   allopurinol (ZYLOPRIM) 300 MG tablet Take 450 mg by mouth daily.      apixaban (ELIQUIS) 5 MG TABS tablet Take 1 tablet (5 mg total) by mouth 2 (two) times daily. 60 tablet 6   atorvastatin (LIPITOR) 80 MG tablet TAKE 1 TABLET BY MOUTH ONCE DAILY AT  6  PM 90 tablet 3   benazepril (LOTENSIN) 40 MG tablet Take 1 tablet by mouth once daily 90 tablet 3   diltiazem (CARDIZEM CD) 180 MG 24 hr capsule Take 1 capsule (180 mg total) by mouth daily. 30 capsule 1   FERREX 150 150 MG capsule Take 150 mg by mouth 2 (two) times daily.     hydrALAZINE (APRESOLINE) 100 MG tablet Take 100 mg by mouth 2 (two) times daily.     hydrochlorothiazide (HYDRODIURIL) 12.5 MG tablet Take 12.5 mg by mouth daily.     latanoprost (XALATAN) 0.005 % ophthalmic solution Place 1 drop into both eyes  at bedtime.     metFORMIN (GLUCOPHAGE-XR) 500 MG 24 hr tablet Take 1,000 mg by mouth 2 (two) times daily.     metoprolol succinate (TOPROL-XL) 50 MG 24 hr tablet Take 1 tablet (50 mg total) by mouth daily. Take with or immediately following a meal. 90 tablet 3   Multiple Vitamin (MULTIVITAMIN) tablet Take 1 tablet by mouth in the morning.     pantoprazole (PROTONIX) 40 MG tablet Take 1 tablet by mouth once daily 90 tablet 3   SIMBRINZA 1-0.2 % SUSP Place 1 drop into both eyes 2 (two) times daily.     vitamin C (ASCORBIC ACID) 500 MG tablet Take 500 mg by mouth 2 (two) times daily.      No current facility-administered medications for this visit.    REVIEW OF SYSTEMS:   Review of Systems  Constitutional:  Negative for appetite change, fatigue and unexpected weight change.  Gastrointestinal:  Negative for diarrhea.  Genitourinary:  Negative for difficulty urinating.   Neurological:  Positive for dizziness (off balance).  All other systems reviewed and are negative.   PHYSICAL EXAMINATION: ECOG PERFORMANCE STATUS: 1 - Symptomatic but completely ambulatory  Vitals:   03/08/21 1322  BP: (!) 121/57  Pulse:  (!) 56  Resp: 18  Temp: 97.7 F (36.5 C)  SpO2: 96%   Filed Weights   03/08/21 1322  Weight: 216 lb 6.4 oz (98.2 kg)   Physical Exam Vitals reviewed.  Constitutional:      Appearance: Normal appearance. He is obese.  Cardiovascular:     Rate and Rhythm: Normal rate and regular rhythm.     Pulses: Normal pulses.     Heart sounds: Normal heart sounds.  Pulmonary:     Effort: Pulmonary effort is normal.     Breath sounds: Normal breath sounds.  Abdominal:     Palpations: Abdomen is soft. There is no hepatomegaly, splenomegaly or mass.     Tenderness: There is no abdominal tenderness.  Musculoskeletal:     Right lower leg: No edema.     Left lower leg: No edema.  Neurological:     General: No focal deficit present.     Mental Status: He is alert and oriented to person, place, and time.  Psychiatric:        Mood and Affect: Mood normal.        Behavior: Behavior normal.     LABORATORY DATA:  I have reviewed the data as listed CBC Latest Ref Rng & Units 02/23/2021 10/12/2020 07/29/2018  WBC 4.0 - 10.5 K/uL 6.2 4.6 7.9  Hemoglobin 13.0 - 17.0 g/dL 12.1(L) 10.7(L) 10.5(L)  Hematocrit 39.0 - 52.0 % 36.6(L) 34.0(L) 32.7(L)  Platelets 150 - 400 K/uL 163 143(L) 192   CMP Latest Ref Rng & Units 02/23/2021 10/26/2020 07/29/2018  Glucose 70 - 99 mg/dL 94 - 155(H)  BUN 8 - 23 mg/dL 20 - 25(H)  Creatinine 0.61 - 1.24 mg/dL 0.84 0.80 0.83  Sodium 135 - 145 mmol/L 138 - 139  Potassium 3.5 - 5.1 mmol/L 3.6 - 4.0  Chloride 98 - 111 mmol/L 106 - 108  CO2 22 - 32 mmol/L 23 - 24  Calcium 8.9 - 10.3 mg/dL 9.4 - 9.1  Total Protein 6.5 - 8.1 g/dL - - 6.4(L)  Total Bilirubin 0.3 - 1.2 mg/dL - - 0.4  Alkaline Phos 38 - 126 U/L - - 92  AST 15 - 41 U/L - - 22  ALT 0 - 44 U/L - - 22  RADIOGRAPHIC STUDIES: I have personally reviewed the radiological images as listed and agreed with the findings in the report. No results found.  ASSESSMENT:  Colon cancer: - Colonoscopy on 10/12/2020 with 7  sessile polyps found in the transverse colon and cecum.  12 mm polyp found in the transverse colon, sessile.  Polyp was removed with piecemeal technique using cold snare.  2 sessile polyps found in the sigmoid colon, removed with cold snare. - Pathology cecal tubular adenoma, sessile serrated polyp of the transverse colon polypectomy, adenocarcinoma arising from a tubular adenoma with high-grade dysplasia of the transverse colon polypectomy.  Sigmoid colon polypectomy showed tubular adenoma, hyperplastic polyp. - Colonoscopy on 02/25/2021 with three 3 to 5 mm polyps in the transverse colon, removed with cold snare.  One 10 mm polyp in the descending colon at 54 cm proximal to the anus. - Pathology on 02/25/2021 shows invasive adenocarcinoma, poorly differentiated, grade 3 of the descending colon polypectomy.  CDX2 positive.  Other findings include transverse colon tubular adenoma and sessile serrated adenoma.   Social/family history: He lives at home with his wife.  He quit smoking 23 years ago.  He smoked 1 pack/day for 7 years.  He worked in [place] prior to retirement.  He is active and walks 2 miles per day. - Father had prostate cancer.  Brother had lung cancer.  Another brother had colon cancer (? colon), Sister with lung cancer, another sister with cancer, patient unknown.  3.  Prostate cancer: - He was diagnosed with prostate cancer in August 2007, status post seed implants.  His PSA has been undetectable since then.   PLAN:  Colon cancer: - He had adenocarcinoma arising in a tubular adenoma removed from transverse colon in July.  He had poorly differentiated grade 3 adenocarcinoma in the descending colon polypectomy on 02/25/2021. - He cannot get IV dye for CT scan.  Hence he was scheduled for MRI of the chest, abdomen and pelvis on 03/17/2021. - I have recommended checking a CEA level and LFTs today.  He is awaiting consultation with Dr. Marcello Moores. - RTC after restaging scans to discuss  further plan.  2.  Normocytic anemia: - He reports that he has been taking iron tablet twice daily along with vitamin C for the last 4 to 5 years. - Will check ferritin, iron panel, C94, folic acid, MMA, and copper.  We will also check SPEP.   All questions were answered. The patient knows to call the clinic with any problems, questions or concerns.   Derek Jack, MD, 03/08/21 2:21 PM  [place] [phone]   I, Thana Ates, am acting as a scribe for Dr. Derek Jack.  I, Derek Jack MD, have reviewed the above documentation for accuracy and completeness, and I agree with the above.

## 2021-03-08 ENCOUNTER — Encounter (HOSPITAL_COMMUNITY): Payer: Self-pay | Admitting: Hematology

## 2021-03-08 ENCOUNTER — Inpatient Hospital Stay (HOSPITAL_COMMUNITY): Payer: Medicare HMO | Attending: Hematology | Admitting: Hematology

## 2021-03-08 ENCOUNTER — Other Ambulatory Visit: Payer: Self-pay

## 2021-03-08 ENCOUNTER — Inpatient Hospital Stay (HOSPITAL_COMMUNITY): Payer: Medicare HMO

## 2021-03-08 VITALS — BP 121/57 | HR 56 | Temp 97.7°F | Resp 18 | Ht 70.0 in | Wt 216.4 lb

## 2021-03-08 DIAGNOSIS — Z7901 Long term (current) use of anticoagulants: Secondary | ICD-10-CM | POA: Insufficient documentation

## 2021-03-08 DIAGNOSIS — Z8546 Personal history of malignant neoplasm of prostate: Secondary | ICD-10-CM | POA: Insufficient documentation

## 2021-03-08 DIAGNOSIS — D509 Iron deficiency anemia, unspecified: Secondary | ICD-10-CM

## 2021-03-08 DIAGNOSIS — Z87891 Personal history of nicotine dependence: Secondary | ICD-10-CM | POA: Diagnosis not present

## 2021-03-08 DIAGNOSIS — Z7984 Long term (current) use of oral hypoglycemic drugs: Secondary | ICD-10-CM | POA: Diagnosis not present

## 2021-03-08 DIAGNOSIS — D649 Anemia, unspecified: Secondary | ICD-10-CM | POA: Diagnosis not present

## 2021-03-08 DIAGNOSIS — C184 Malignant neoplasm of transverse colon: Secondary | ICD-10-CM

## 2021-03-08 DIAGNOSIS — Z79899 Other long term (current) drug therapy: Secondary | ICD-10-CM | POA: Insufficient documentation

## 2021-03-08 DIAGNOSIS — C186 Malignant neoplasm of descending colon: Secondary | ICD-10-CM | POA: Diagnosis not present

## 2021-03-08 LAB — HEPATIC FUNCTION PANEL
ALT: 24 U/L (ref 0–44)
AST: 29 U/L (ref 15–41)
Albumin: 4.1 g/dL (ref 3.5–5.0)
Alkaline Phosphatase: 82 U/L (ref 38–126)
Bilirubin, Direct: 0.1 mg/dL (ref 0.0–0.2)
Indirect Bilirubin: 0.4 mg/dL (ref 0.3–0.9)
Total Bilirubin: 0.5 mg/dL (ref 0.3–1.2)
Total Protein: 6.9 g/dL (ref 6.5–8.1)

## 2021-03-08 LAB — FOLATE: Folate: 30.2 ng/mL (ref >5.9)

## 2021-03-08 LAB — IRON AND TIBC
Iron: 56 ug/dL (ref 45–182)
Saturation Ratios: 18 % (ref 17.9–39.5)
TIBC: 319 ug/dL (ref 250–450)
UIBC: 263 ug/dL

## 2021-03-08 LAB — FERRITIN: Ferritin: 26 ng/mL (ref 24–336)

## 2021-03-08 LAB — VITAMIN B12: Vitamin B-12: 386 pg/mL (ref 180–914)

## 2021-03-08 NOTE — Patient Instructions (Signed)
[facility] at [facility] Discharge Instructions  You were seen and examined today by Dr. Delton Coombes. Dr. Delton Coombes is a medical oncologist, meaning that he specializes in the management of cancer diagnoses with medications. Dr. Delton Coombes discussed your past medical history, family history of cancers, and the events that led to you being here today.  You have been diagnosed with Colon Cancer. Proceed with your MRI to complete the staging process. If there is no sign of cancer outside of the colon, you will go straight to surgery. During surgery, they will also remove lymph nodes to ensure there is no lymph node involvement.  Dr. Delton Coombes has recommended blood work today due to anemia.  Follow-up as scheduled.   Thank you for choosing [facility] at [facility] to provide your oncology and hematology care.  To afford each patient quality time with our provider, please arrive at least 15 minutes before your scheduled appointment time.   If you have a lab appointment with the [facility] please come in thru the Main Entrance and check in at the main information desk.  You need to re-schedule your appointment should you arrive 10 or more minutes late.  We strive to give you quality time with our providers, and arriving late affects you and other patients whose appointments are after yours.  Also, if you no show three or more times for appointments you may be dismissed from the clinic at the providers discretion.     Again, thank you for choosing [facility].  Our hope is that these requests will decrease the amount of time that you wait before being seen by our physicians.       _____________________________________________________________  Should you have questions after your visit to [facility], please contact our office at [phone] and follow the prompts.  Our office hours are 8:00 a.m. and 4:30 p.m. Monday -  Friday.  Please note that voicemails left after 4:00 p.m. may not be returned until the following business day.  We are closed weekends and major holidays.  You do have access to a nurse 24-7, just call the main number to the clinic [phone] and do not press any options, hold on the line and a nurse will answer the phone.    For prescription refill requests, have your pharmacy contact our office and allow 72 hours.    Due to Covid, you will need to wear a mask upon entering the hospital. If you do not have a mask, a mask will be given to you at the Main Entrance upon arrival. For doctor visits, patients may have 1 support person age 80 or older with them. For treatment visits, patients can not have anyone with them due to social distancing guidelines and our immunocompromised population.

## 2021-03-09 DIAGNOSIS — H524 Presbyopia: Secondary | ICD-10-CM | POA: Diagnosis not present

## 2021-03-10 LAB — CEA: CEA: 5.2 ng/mL — ABNORMAL HIGH (ref 0.0–4.7)

## 2021-03-11 LAB — PROTEIN ELECTROPHORESIS, SERUM
A/G Ratio: 1.5 (ref 0.7–1.7)
Albumin ELP: 3.8 g/dL (ref 2.9–4.4)
Alpha-1-Globulin: 0.2 g/dL (ref 0.0–0.4)
Alpha-2-Globulin: 0.7 g/dL (ref 0.4–1.0)
Beta Globulin: 0.8 g/dL (ref 0.7–1.3)
Gamma Globulin: 0.8 g/dL (ref 0.4–1.8)
Globulin, Total: 2.5 g/dL (ref 2.2–3.9)
Total Protein ELP: 6.3 g/dL (ref 6.0–8.5)

## 2021-03-11 LAB — METHYLMALONIC ACID, SERUM: Methylmalonic Acid, Quantitative: 83 nmol/L (ref 0–378)

## 2021-03-11 LAB — COPPER, SERUM: Copper: 98 ug/dL (ref 69–132)

## 2021-03-15 ENCOUNTER — Ambulatory Visit (HOSPITAL_COMMUNITY): Payer: Medicare HMO | Admitting: Hematology

## 2021-03-17 ENCOUNTER — Ambulatory Visit (HOSPITAL_COMMUNITY)
Admission: RE | Admit: 2021-03-17 | Discharge: 2021-03-17 | Disposition: A | Discharge status: Home / Self Care | Payer: Medicare HMO | Source: Ambulatory Visit | Attending: Gastroenterology | Admitting: Gastroenterology

## 2021-03-17 ENCOUNTER — Encounter (HOSPITAL_COMMUNITY): Payer: Self-pay

## 2021-03-17 ENCOUNTER — Telehealth (INDEPENDENT_AMBULATORY_CARE_PROVIDER_SITE_OTHER): Payer: Self-pay

## 2021-03-17 ENCOUNTER — Other Ambulatory Visit (INDEPENDENT_AMBULATORY_CARE_PROVIDER_SITE_OTHER): Payer: Self-pay

## 2021-03-17 ENCOUNTER — Other Ambulatory Visit: Payer: Self-pay

## 2021-03-17 DIAGNOSIS — C186 Malignant neoplasm of descending colon: Secondary | ICD-10-CM | POA: Insufficient documentation

## 2021-03-17 DIAGNOSIS — C189 Malignant neoplasm of colon, unspecified: Secondary | ICD-10-CM | POA: Insufficient documentation

## 2021-03-17 DIAGNOSIS — T7840XA Allergy, unspecified, initial encounter: Secondary | ICD-10-CM

## 2021-03-17 MED ORDER — METHYLPREDNISOLONE 32 MG PO TABS: 32.0000 mg | ORAL_TABLET | Freq: Every day | ORAL | 0 refills | Status: AC

## 2021-03-17 NOTE — Telephone Encounter (Signed)
LeighAnn Kamryn Gauthier, CMA  

## 2021-03-18 ENCOUNTER — Ambulatory Visit (HOSPITAL_COMMUNITY)
Admission: RE | Admit: 2021-03-18 | Discharge: 2021-03-18 | Disposition: A | Discharge status: Home / Self Care | Payer: Medicare HMO | Source: Ambulatory Visit | Attending: Gastroenterology | Admitting: Gastroenterology

## 2021-03-18 DIAGNOSIS — C189 Malignant neoplasm of colon, unspecified: Secondary | ICD-10-CM | POA: Diagnosis not present

## 2021-03-18 DIAGNOSIS — C186 Malignant neoplasm of descending colon: Secondary | ICD-10-CM | POA: Diagnosis not present

## 2021-03-18 MED ORDER — IOHEXOL 300 MG/ML  SOLN
100.0000 mL | Freq: Once | INTRAMUSCULAR | Status: AC | PRN
  Administered 2021-03-18: 12:00:00 100 mL via INTRAVENOUS

## 2021-03-21 NOTE — Progress Notes (Signed)
[place], [place] [address]   CLINIC:  Medical Oncology/Hematology  PCP:  Caryl Bis, MD [address] [place] [address] [phone]   REASON FOR VISIT:  Follow-up for colon cancer  PRIOR THERAPY: none  NGS Results: not done  CURRENT THERAPY: under work-up  BRIEF ONCOLOGIC HISTORY:  Oncology History   No history exists.    CANCER STAGING:  Cancer Staging  No matching staging information was found for the patient.  INTERVAL HISTORY:  [name] [name], a 80 y.o. male, returns for routine follow-up of [name] [name] colon cancer. [name] [name] was last seen on 03/08/2021.   Today [name] [name] reports feeling good, and [name] [name] is accompanied by [name] [name] wife.  [name] [name] denies any current bleeding.   REVIEW OF SYSTEMS:  Review of Systems  Constitutional:  Negative for appetite change and fatigue.  All other systems reviewed and are negative.  PAST MEDICAL/SURGICAL HISTORY:  Past Medical History:  Diagnosis Date   Anemia    Arthritis    CAD (coronary artery disease)    DES to circumflex 12/2017   GERD (gastroesophageal reflux disease)    Glaucoma    Gout    Headache    History of kidney stones    Hypertension    NSTEMI (non-ST elevated myocardial infarction) ([facility]) 12/31/2017   Paroxysmal atrial fibrillation (HCC)    Pericardial effusion    Idiopathic, recurrent pericardial effusion s/p pericardial window.   Prostate cancer ([facility]) 2007   S/P Seed implants    Supraventricular tachycardia ([place] [facility])    Temporal arteritis ([facility])    Past Surgical History:  Procedure Laterality Date   BIOPSY  10/12/2020   Procedure: BIOPSY;  Surgeon: [name], MD;  [facility];  Service: Gastroenterology;;   BIOPSY  02/25/2021   Procedure: BIOPSY;  Surgeon: [name], MD;  [facility];  Service: Gastroenterology;;   CATARACT EXTRACTION W/PHACO Left 06/07/2015   Procedure: CATARACT EXTRACTION PHACO AND INTRAOCULAR LENS PLACEMENT  LEFT EYE CDE=8.00;  Surgeon: Tonny Branch, MD;  Location: [facility];  Service: Ophthalmology;  Laterality: Left;   CATARACT EXTRACTION W/PHACO Right 06/17/2015   Procedure: CATARACT EXTRACTION PHACO AND INTRAOCULAR LENS PLACEMENT RIGHT EYE CDE=11.09;  Surgeon: Tonny Branch, MD;  Location: [facility];  Service: Ophthalmology;  Laterality: Right;   COLONOSCOPY WITH PROPOFOL N/A 10/12/2020   Procedure: COLONOSCOPY WITH PROPOFOL;  Surgeon: [name], MD;  [facility];  Service: Gastroenterology;  Laterality: N/A;  10:35   COLONOSCOPY WITH PROPOFOL N/A 02/25/2021   Procedure: COLONOSCOPY WITH PROPOFOL;  Surgeon: [name], MD;  [facility];  Service: Gastroenterology;  Laterality: N/A;  8:10   CORONARY ANGIOPLASTY WITH STENT PLACEMENT  01/01/2018   CORONARY STENT INTERVENTION N/A 01/01/2018   Procedure: CORONARY STENT INTERVENTION;  Surgeon: Jettie Booze, MD;  Location: [facility];  Service: Cardiovascular;  Laterality: N/A;   ENTEROSCOPY N/A 10/12/2020   Procedure: PUSH ENTEROSCOPY;  Surgeon: [name], MD;  [facility];  Service: Gastroenterology;  Laterality: N/A;   ESOPHAGOGASTRODUODENOSCOPY (EGD) WITH PROPOFOL N/A 10/12/2020   Procedure: ESOPHAGOGASTRODUODENOSCOPY (EGD) WITH PROPOFOL;  Surgeon: [name], MD;  [facility];  Service: Gastroenterology;  Laterality: N/A;   FRACTURE SURGERY Left 2010   ankle   INSERTION PROSTATE RADIATION SEED  2007   KNEE ARTHROSCOPY Left    LEFT HEART CATH AND CORONARY ANGIOGRAPHY N/A 01/01/2018  Procedure: LEFT HEART CATH AND CORONARY ANGIOGRAPHY;  Surgeon: Jettie Booze, MD;  Location: [facility];  Service: Cardiovascular;  Laterality: N/A;   ORIF TIBIA & FIBULA FRACTURES Left 2003   Distal tibial/fibula    PERICARDIAL WINDOW  02/2007   PERICARDIOCENTESIS  2007   hx/notes 10/19/2011   POLYPECTOMY  10/12/2020   Procedure:  POLYPECTOMY;  Surgeon: [name], MD;  [facility];  Service: Gastroenterology;;  small bowel, cecal   POLYPECTOMY  02/25/2021   Procedure: POLYPECTOMY;  Surgeon: [name], MD;  [facility];  Service: Gastroenterology;;  transverse colon x2    SOCIAL HISTORY:  Social History   Socioeconomic History   Marital status: Married    Spouse name: Not on file   Number of children: Not on file   Years of education: Not on file   Highest education level: Not on file  Occupational History   Not on file  Tobacco Use   Smoking status: Former    Packs/day: 0.30    Years: 10.00    Pack years: 3.00    Types: Cigarettes    Start date: 08/06/1958    Quit date: 1968    Years since quitting: 55.0   Smokeless tobacco: Never  Vaping Use   Vaping Use: Never used  Substance and Sexual Activity   Alcohol use: Not Currently   Drug use: Never   Sexual activity: Yes  Other Topics Concern   Not on file  Social History Narrative   Not on file   Social Determinants of Health   Financial Resource Strain: Not on file  Food Insecurity: Not on file  Transportation Needs: Not on file  Physical Activity: Not on file  Stress: Not on file  Social Connections: Not on file  Intimate Partner Violence: Not on file    FAMILY HISTORY:  Family History  Problem Relation Age of Onset   CAD Mother        MI in 59s   Prostate cancer Father    Prostate cancer Brother    Lung cancer Brother     CURRENT MEDICATIONS:  Current Outpatient Medications  Medication Sig Dispense Refill   allopurinol (ZYLOPRIM) 300 MG tablet Take 450 mg by mouth daily.      apixaban (ELIQUIS) 5 MG TABS tablet Take 1 tablet (5 mg total) by mouth 2 (two) times daily. 60 tablet 6   atorvastatin (LIPITOR) 80 MG tablet TAKE 1 TABLET BY MOUTH ONCE DAILY AT  6  PM 90 tablet 3   benazepril (LOTENSIN) 40 MG tablet Take 1 tablet by mouth once daily 90 tablet 3   diltiazem (CARDIZEM  CD) 180 MG 24 hr capsule Take 1 capsule (180 mg total) by mouth daily. 30 capsule 1   FERREX 150 150 MG capsule Take 150 mg by mouth 2 (two) times daily.     hydrALAZINE (APRESOLINE) 100 MG tablet Take 100 mg by mouth 2 (two) times daily.     hydrochlorothiazide (HYDRODIURIL) 12.5 MG tablet Take 12.5 mg by mouth daily.     latanoprost (XALATAN) 0.005 % ophthalmic solution Place 1 drop into both eyes at bedtime.     metFORMIN (GLUCOPHAGE-XR) 500 MG 24 hr tablet Take 1,000 mg by mouth 2 (two) times daily.     metoprolol succinate (TOPROL-XL) 50 MG 24 hr tablet Take 1 tablet (50 mg total) by mouth daily. Take with or immediately following a meal. 90 tablet 3   Multiple Vitamin (  MULTIVITAMIN) tablet Take 1 tablet by mouth in the morning.     pantoprazole (PROTONIX) 40 MG tablet Take 1 tablet by mouth once daily 90 tablet 3   SIMBRINZA 1-0.2 % SUSP Place 1 drop into both eyes 2 (two) times daily.     vitamin C (ASCORBIC ACID) 500 MG tablet Take 500 mg by mouth 2 (two) times daily.      No current facility-administered medications for this visit.    ALLERGIES:  Allergies  Allergen Reactions   Amiodarone Rash    PHYSICAL EXAM:  Performance status (ECOG): 1 - Symptomatic but completely ambulatory  Vitals:   03/22/21 1050  BP: (!) 155/60  Pulse: (!) 59  Resp: 18  Temp: 97.6 F (36.4 C)  SpO2: 98%   Wt Readings from Last 3 Encounters:  03/22/21 216 lb 9.6 oz (98.2 kg)  03/08/21 216 lb 6.4 oz (98.2 kg)  02/23/21 211 lb (95.7 kg)   Physical Exam Vitals reviewed.  Constitutional:      Appearance: Normal appearance. [name] [name] is obese.  Cardiovascular:     Rate and Rhythm: Normal rate and regular rhythm.     Pulses: Normal pulses.     Heart sounds: Normal heart sounds.  Pulmonary:     Effort: Pulmonary effort is normal.     Breath sounds: Normal breath sounds.  Neurological:     General: No focal deficit present.     Mental Status: [name] [name] is alert and oriented to person, place, and time.   Psychiatric:        Mood and Affect: Mood normal.        Behavior: Behavior normal.     LABORATORY DATA:  I have reviewed the labs as listed.  CBC Latest Ref Rng & Units 02/23/2021 10/12/2020 07/29/2018  WBC 4.0 - 10.5 K/uL 6.2 4.6 7.9  Hemoglobin 13.0 - 17.0 g/dL 12.1(L) 10.7(L) 10.5(L)  Hematocrit 39.0 - 52.0 % 36.6(L) 34.0(L) 32.7(L)  Platelets 150 - 400 K/uL 163 143(L) 192   CMP Latest Ref Rng & Units 03/08/2021 02/23/2021 10/26/2020  Glucose 70 - 99 mg/dL - 94 -  BUN 8 - 23 mg/dL - 20 -  Creatinine 0.61 - 1.24 mg/dL - 0.84 0.80  Sodium 135 - 145 mmol/L - 138 -  Potassium 3.5 - 5.1 mmol/L - 3.6 -  Chloride 98 - 111 mmol/L - 106 -  CO2 22 - 32 mmol/L - 23 -  Calcium 8.9 - 10.3 mg/dL - 9.4 -  Total Protein 6.5 - 8.1 g/dL 6.9 - -  Total Bilirubin 0.3 - 1.2 mg/dL 0.5 - -  Alkaline Phos 38 - 126 U/L 82 - -  AST 15 - 41 U/L 29 - -  ALT 0 - 44 U/L 24 - -    DIAGNOSTIC IMAGING:  I have independently reviewed the scans and discussed with the patient. CT CHEST ABDOMEN PELVIS W CONTRAST  Result Date: 03/19/2021 CLINICAL DATA:  Newly diagnosed colon cancer EXAM: CT CHEST, ABDOMEN, AND PELVIS WITH CONTRAST TECHNIQUE: Multidetector CT imaging of the chest, abdomen and pelvis was performed following the standard protocol during bolus administration of intravenous contrast. CONTRAST:  144mL OMNIPAQUE IOHEXOL 300 MG/ML  SOLN COMPARISON:  MRI abdomen/pelvis dated 11/06/2020 FINDINGS: CT CHEST FINDINGS Cardiovascular: Cardiomegaly.  Small pericardial effusion. No evidence of thoracic aortic aneurysm. Atherosclerotic calcifications of the aortic arch. Three vessel coronary atherosclerosis. Mediastinum/Nodes: No suspicious mediastinal lymphadenopathy. Visualized thyroid is unremarkable. Lungs/Pleura: Mild centrilobular and paraseptal emphysematous changes, upper lung predominant. 5 mm triangular  subpleural nodule in the posterior left lower lobe, likely a benign subpleural lymph node, although  technically indeterminate. No focal consolidation. No pleural effusion or pneumothorax. Musculoskeletal: Mild degenerative changes of the thoracic spine. CT ABDOMEN PELVIS FINDINGS Hepatobiliary: Scattered hepatic cysts, measuring up to 2.1 cm in segment 2 (series 2/image 51). No suspicious/enhancing hepatic lesions. Gallbladder is unremarkable. No intrahepatic or extrahepatic ductal dilatation. Pancreas: Within normal limits. Spleen: Within normal limits. Adrenals/Urinary Tract: Adrenal glands are within normal limits. Kidneys are within normal limits.  No hydronephrosis. Bladder is underdistended but unremarkable. Stomach/Bowel: Stomach is within normal limits. No evidence of bowel obstruction. Appendix is not discretely visualized. No colonic wall thickening or mass is evident on CT. Specifically, a polypoid lesion is not evident in the descending colon. Vascular/Lymphatic: No evidence of abdominal aortic aneurysm. Atherosclerotic calcifications of the abdominal aorta and branch vessels. No suspicious abdominopelvic lymphadenopathy. Reproductive: Brachytherapy seeds in the prostate. Other: No abdominopelvic ascites. Mild fat in the bilateral inguinal canals. Musculoskeletal: Mild degenerative changes of the lumbar spine. IMPRESSION: No colonic wall thickening or mass is evident on CT. No findings specific for metastatic disease. 5 mm triangular subpleural nodule in the posterior left lower lobe, likely benign. Isolated pulmonary metastasis considered unlikely. However, follow-up imaging is warranted. While Fleischner Society guidelines do not apply in the setting of known malignancy, consider follow-up CT chest in 6 months. Additional ancillary findings as above. Electronically Signed   By: Julian Hy M.D.   On: 03/19/2021 09:11     ASSESSMENT:  Colon cancer: - Colonoscopy on 10/12/2020 with 7 sessile polyps found in the transverse colon and cecum.  12 mm polyp found in the transverse colon, sessile.   Polyp was removed with piecemeal technique using cold snare.  2 sessile polyps found in the sigmoid colon, removed with cold snare. - Pathology cecal tubular adenoma, sessile serrated polyp of the transverse colon polypectomy, adenocarcinoma arising from a tubular adenoma with high-grade dysplasia of the transverse colon polypectomy.  Sigmoid colon polypectomy showed tubular adenoma, hyperplastic polyp. - Colonoscopy on 02/25/2021 with three 3 to 5 mm polyps in the transverse colon, removed with cold snare.  One 10 mm polyp in the descending colon at 54 cm proximal to the anus. - Pathology on 02/25/2021 shows invasive adenocarcinoma, poorly differentiated, grade 3 of the descending colon polypectomy.  CDX2 positive.  Other findings include transverse colon tubular adenoma and sessile serrated adenoma.    Social/family history: [name] [name] lives at home with [name] [name] wife.  [name] [name] quit smoking 23 years ago.  [name] [name] smoked 1 pack/day for 7 years.  [name] [name] worked in [place] prior to retirement.  [name] [name] is active and walks 2 miles per day. - Father had prostate cancer.  Brother had lung cancer.  Another brother had colon cancer (? colon), Sister with lung cancer, another sister with cancer, patient unknown.  3.  Prostate cancer: - [name] [name] was diagnosed with prostate cancer in August 2007, status post seed implants.  [name] [name] PSA has been undetectable since then.   PLAN:  Colon cancer: - [name] [name] had adenocarcinoma arising in a tubular adenoma removed from transverse colon in July 2022.  [name] [name] had poorly differentiated grade 3 adenocarcinoma in the descending colon polypectomy on 02/25/2021.  No clear mention of margins on the pathology report. - We reviewed CT CAP from 03/19/2021 which did not show any specific findings of metastatic disease.  5 mm triangular subpleural nodule in the posterior left lower lobe, likely benign. - [name] [name] is scheduled to see Dr.  Thomas for surgical opinion on 04/01/2021. - [name] [name] CEA was mildly elevated at 5.2 on 03/08/2021.  [name] [name] is  a non-smoker. - I plan to see him back in 3 months for follow-up with ferritin, iron panel, CEA.  2.  Normocytic anemia: - We have reviewed anemia labs from 03/08/2021 which showed ferritin 26 and percent saturation 18. - Folic acid, B28, copper, MMA, SPEP were normal. - Continue iron tablet twice daily along with vitamin C.   Orders placed this encounter:  No orders of the defined types were placed in this encounter.    Derek Jack, MD [place] [phone]   I, Thana Ates, am acting as a scribe for Dr. Derek Jack.  I, Derek Jack MD, have reviewed the above documentation for accuracy and completeness, and I agree with the above.

## 2021-03-22 ENCOUNTER — Inpatient Hospital Stay (HOSPITAL_BASED_OUTPATIENT_CLINIC_OR_DEPARTMENT_OTHER): Payer: Medicare HMO | Admitting: Hematology

## 2021-03-22 ENCOUNTER — Other Ambulatory Visit: Payer: Self-pay

## 2021-03-22 ENCOUNTER — Encounter (HOSPITAL_COMMUNITY): Payer: Self-pay

## 2021-03-22 VITALS — BP 155/60 | HR 59 | Temp 97.6°F | Resp 18 | Ht 70.0 in | Wt 216.6 lb

## 2021-03-22 DIAGNOSIS — D649 Anemia, unspecified: Secondary | ICD-10-CM | POA: Diagnosis not present

## 2021-03-22 DIAGNOSIS — C184 Malignant neoplasm of transverse colon: Secondary | ICD-10-CM | POA: Diagnosis not present

## 2021-03-22 DIAGNOSIS — Z79899 Other long term (current) drug therapy: Secondary | ICD-10-CM | POA: Diagnosis not present

## 2021-03-22 DIAGNOSIS — C186 Malignant neoplasm of descending colon: Secondary | ICD-10-CM | POA: Diagnosis not present

## 2021-03-22 DIAGNOSIS — Z8546 Personal history of malignant neoplasm of prostate: Secondary | ICD-10-CM | POA: Diagnosis not present

## 2021-03-22 DIAGNOSIS — Z7984 Long term (current) use of oral hypoglycemic drugs: Secondary | ICD-10-CM | POA: Diagnosis not present

## 2021-03-22 DIAGNOSIS — D509 Iron deficiency anemia, unspecified: Secondary | ICD-10-CM

## 2021-03-22 DIAGNOSIS — Z87891 Personal history of nicotine dependence: Secondary | ICD-10-CM | POA: Diagnosis not present

## 2021-03-22 DIAGNOSIS — Z7901 Long term (current) use of anticoagulants: Secondary | ICD-10-CM | POA: Diagnosis not present

## 2021-03-22 NOTE — Patient Instructions (Addendum)
[place] at [facility] Discharge Instructions   You were seen and examined today by Dr. Delton Coombes. He reviewed your scan results, which were stable/good. Continue iron tablets as you are taking.  Return as scheduled in 3 months.    Thank you for choosing [place] at [facility] to provide your oncology and hematology care.  To afford each patient quality time with our provider, please arrive at least 15 minutes before your scheduled appointment time.   If you have a lab appointment with the [facility] please come in thru the Main Entrance and check in at the main information desk.  You need to re-schedule your appointment should you arrive 10 or more minutes late.  We strive to give you quality time with our providers, and arriving late affects you and other patients whose appointments are after yours.  Also, if you no show three or more times for appointments you may be dismissed from the clinic at the providers discretion.     Again, thank you for choosing [facility].  Our hope is that these requests will decrease the amount of time that you wait before being seen by our physicians.       _____________________________________________________________  Should you have questions after your visit to [facility], please contact our office at [phone] and follow the prompts.  Our office hours are 8:00 a.m. and 4:30 p.m. Monday - Friday.  Please note that voicemails left after 4:00 p.m. may not be returned until the following business day.  We are closed weekends and major holidays.  You do have access to a nurse 24-7, just call the main number to the clinic [phone] and do not press any options, hold on the line and a nurse will answer the phone.    For prescription refill requests, have your pharmacy contact our office and allow 72 hours.    Due to Covid, you will need to wear a mask upon entering the  hospital. If you do not have a mask, a mask will be given to you at the Main Entrance upon arrival. For doctor visits, patients may have 1 support person age 80 or older with them. For treatment visits, patients can not have anyone with them due to social distancing guidelines and our immunocompromised population.

## 2021-03-24 ENCOUNTER — Other Ambulatory Visit: Payer: Self-pay | Admitting: Cardiology

## 2021-03-25 DIAGNOSIS — N182 Chronic kidney disease, stage 2 (mild): Secondary | ICD-10-CM | POA: Diagnosis not present

## 2021-03-25 DIAGNOSIS — D500 Iron deficiency anemia secondary to blood loss (chronic): Secondary | ICD-10-CM | POA: Diagnosis not present

## 2021-03-25 DIAGNOSIS — I482 Chronic atrial fibrillation, unspecified: Secondary | ICD-10-CM | POA: Diagnosis not present

## 2021-03-25 DIAGNOSIS — E1122 Type 2 diabetes mellitus with diabetic chronic kidney disease: Secondary | ICD-10-CM | POA: Diagnosis not present

## 2021-03-25 DIAGNOSIS — I129 Hypertensive chronic kidney disease with stage 1 through stage 4 chronic kidney disease, or unspecified chronic kidney disease: Secondary | ICD-10-CM | POA: Diagnosis not present

## 2021-03-27 DIAGNOSIS — C189 Malignant neoplasm of colon, unspecified: Secondary | ICD-10-CM

## 2021-03-27 HISTORY — DX: Malignant neoplasm of colon, unspecified: C18.9

## 2021-03-28 DIAGNOSIS — J329 Chronic sinusitis, unspecified: Secondary | ICD-10-CM | POA: Diagnosis not present

## 2021-03-28 DIAGNOSIS — Z20828 Contact with and (suspected) exposure to other viral communicable diseases: Secondary | ICD-10-CM | POA: Diagnosis not present

## 2021-03-28 DIAGNOSIS — R059 Cough, unspecified: Secondary | ICD-10-CM | POA: Diagnosis not present

## 2021-03-28 DIAGNOSIS — Z6828 Body mass index (BMI) 28.0-28.9, adult: Secondary | ICD-10-CM | POA: Diagnosis not present

## 2021-03-31 ENCOUNTER — Encounter (HOSPITAL_COMMUNITY): Payer: Self-pay | Admitting: Gastroenterology

## 2021-04-01 ENCOUNTER — Ambulatory Visit: Payer: Self-pay | Admitting: General Surgery

## 2021-04-01 DIAGNOSIS — C186 Malignant neoplasm of descending colon: Secondary | ICD-10-CM | POA: Diagnosis not present

## 2021-04-01 NOTE — H&P (Signed)
REFERRING PHYSICIAN:  Dr [name]   PROVIDER:  Monico Blitz, MD   MRN: [id number] DOB: 08-04-1940 DATE OF ENCOUNTER: 04/01/2021   Subjective      History of Present Illness: [name] [name] is a 81 y.o. male who is seen today as an office consultation at the request of Dr. Montez Morita for evaluation of results from recent colonoscopy .   81 year old male who underwent a colonoscopy for iron deficiency anemia several months ago.  7 sessile polyps were found in the transverse colon and cecum 3 to 8 mm in size.  These were removed.  Pathology shows tubular adenoma and 1 sessile serrated adenoma.    1 transverse colon polyp could not be retrieved.  A 12 mm polyp was found in the transverse colon the polyp was sessile and removed in a piecemeal technique.  2 sessile polyps were found in the sigmoid colon.  It appears that the transverse colon polyp that was removed piecemeal showed adenocarcinoma arising from a tubular adenoma with high-grade dysplasia.  Margins were unable to be assessed.  It does not appear that the transverse colon lesion was tattooed.  He then underwent MRI of the abdomen and pelvis for staging work-up (CT not done due to a possible allergy to iodinated contrast).  This did not show any signs of lymphadenopathy or metastatic disease.  No CEA level was noted in epic at that time.    We decided to proceed with a follow-up colonoscopy.  This was completed in early December.  3 polyps were found in the transverse colon.  These were resected and removed.  A 10 mm polyp was found in the descending colon at approximately 54 cm from the anus.  This was biopsied and thought to correspond to the area of malignancy found on previous colonoscopy.  Area was tattooed distally.  Final pathology shows poorly differentiated adenocarcinoma.  CT scan of the chest abdomen and pelvis (completed with allergy protocol) shows no sign of metastatic disease.  CEA is mildly elevated at  5.2.  Patient is in reasonably good health with some mild cardiac disease on Elliquis.  He walks 2 miles a day.  He has never had any abdominal surgeries before.     Review of Systems: A complete review of systems was obtained from the patient.  I have reviewed this information and discussed as appropriate with the patient.  See HPI as well for other ROS.     Medical History: Past Medical History      Past Medical History:  Diagnosis Date   Anemia     Arthritis     CAD (coronary artery disease)     Colon cancer (CMS-HCC)     Diabetes (CMS-HCC)     GERD (gastroesophageal reflux disease)     Glaucoma     H/O non-ST elevation myocardial infarction (NSTEMI)     Hyperlipemia     Hypertension             Patient Active Problem List  Diagnosis   Asymmetrical sensorineural hearing loss   CAD (coronary artery disease)   Colon cancer (CMS-HCC)   Duodenal adenoma   Hyperlipidemia   Iron deficiency anemia   Malignant tumor of prostate (CMS-HCC)   NSTEMI (non-ST elevated myocardial infarction) (CMS-HCC)   Paroxysmal atrial fibrillation (CMS-HCC)   Status post coronary artery stent placement      Past Surgical History  History reviewed. No pertinent surgical history.      Allergies  Allergies  Allergen Reactions   Amiodarone Rash      rash rash                Current Outpatient Medications on File Prior to Visit  Medication Sig Dispense Refill   metFORMIN (GLUCOPHAGE-XR) 500 MG XR tablet Take 500 mg by mouth 2 (two) times daily       allopurinoL (ZYLOPRIM) 300 MG tablet Take by mouth       apixaban (ELIQUIS) 5 mg tablet Take 5 mg by mouth 2 (two) times daily       ascorbic acid, vitamin C, (VITAMIN C) 500 MG tablet Take 500 mg by mouth 2 (two) times daily       atorvastatin (LIPITOR) 80 MG tablet TAKE 1 TABLET BY MOUTH ONCE DAILY AT  6  PM       benazepriL (LOTENSIN) 40 MG tablet Take 1 tablet by mouth once daily       diltiazem (CARTIA XT) 180 MG CD capsule  Take 1 capsule by mouth once daily       hydrALAZINE (APRESOLINE) 100 MG tablet Take 100 mg by mouth 2 (two) times daily       hydroCHLOROthiazide (HYDRODIURIL) 12.5 MG tablet Take 12.5 mg by mouth once daily       iron polysaccharides (IRON POLYSACCHARIDES) 150 mg iron capsule Take 150 mg by mouth 2 (two) times daily       latanoprost (XALATAN) 0.005 % ophthalmic solution Apply 1 drop to eye at bedtime       metoprolol succinate (TOPROL-XL) 50 MG XL tablet Take 50 mg by mouth once daily       multivitamin (MULTIVITAMIN) tablet Take 1 tablet by mouth once daily       pantoprazole (PROTONIX) 40 MG DR tablet Take 1 tablet by mouth once daily       SIMBRINZA 1-0.2 % DrpS Place 1 drop into both eyes 2 (two) times daily        No current facility-administered medications on file prior to visit.      Family History  History reviewed. No pertinent family history.      Social History       Tobacco Use  Smoking Status Former  Smokeless Tobacco Never      Social History  Social History        Socioeconomic History   Marital status: Married  Tobacco Use   Smoking status: Former   Smokeless tobacco: Never        Objective:      There were no vitals filed for this visit.    Exam Gen: NAD CV: RRR Lungs: CTAB Abd: soft, nontender       Labs, Imaging and Diagnostic Testing: CT personally reviewed.  No signs of wall thickening in descending or transverse colon.   Assessment and Plan:  Diagnoses and all orders for this visit:   Malignant neoplasm of colon, unspecified part of colon (CMS-HCC) -     polyethylene glycol (MIRALAX) powder; Take 233.75 g by mouth once for 1 dose Take according to your procedure prep instructions. -     bisacodyL (DULCOLAX) 5 mg EC tablet; Take 4 tablets (20 mg total) by mouth once daily as needed for Constipation for up to 1 dose -     metroNIDAZOLE (FLAGYL) 500 MG tablet; Take 2 tablets (1,000 mg total) by mouth 3 (three) times daily for 3 doses  Take according to your procedure colon prep instructions -  neomycin 500 mg tablet; Take 2 tablets (1,000 mg total) by mouth 3 (three) times daily for 3 doses Take according to your procedure colon prep instructions     81 year old male who presents to the office with what appears to be a descending colon cancer.  This has been tattooed distally and marked ~54cm from the anus on colonoscopy.  I have recommended a robotic assisted partial colectomy.  We will plan on a definitive resection once we identify the area of tattoo.  Most likely this will be a sigmoidectomy or left hemicolectomy.  We have discussed the risk and benefits of this.  I believe he is in good condition for surgery.  We will get cardiac clearance prior to surgery.  We will plan on proceeding as soon as possible.   The surgery and anatomy were described to the patient as well as the risks of surgery and the possible complications.  These include: Bleeding, deep abdominal infections and possible wound complications such as hernia and infection, damage to adjacent structures, leak of surgical connections, which can lead to other surgeries and possibly an ostomy, possible need for other procedures, such as abscess drains in radiology, possible prolonged hospital stay, possible diarrhea from removal of part of the colon, possible constipation from narcotics, possible bowel, bladder or sexual dysfunction if having rectal surgery, prolonged fatigue/weakness or appetite loss, possible early recurrence of of disease, possible complications of their medical problems such as heart disease or arrhythmias or lung problems, death (less than 1%). I believe the patient understands and wishes to proceed with the surgery.

## 2021-04-05 ENCOUNTER — Telehealth: Payer: Self-pay | Admitting: Cardiology

## 2021-04-05 NOTE — Telephone Encounter (Signed)
I s/w the pt and his wife that the pt will need an appt for pre op clearance. I informed the pt that I did not have anything available in the [place] office as well as [place] location. I offered the pt an appt 04/19/21 @ 1:55 with Richardson Dopp, PAC at [facility] Location for pre op clearance. Pt is agreeable and thankful for the help. I will forward notes to [facility] for upcoming appt. Will send FYI to requesting office the pt has appt 04/19/21 with Richardson Dopp, PAC.

## 2021-04-05 NOTE — Telephone Encounter (Signed)
Primary Cardiologist:Samuel Domenic Polite, MD  Chart reviewed as part of pre-operative protocol coverage. Because of [name] [name]'s past medical history and time since last visit, he/she will require a follow-up visit in order to better assess preoperative cardiovascular risk.  Pre-op covering staff: - Please schedule appointment and call patient to inform them. - Please contact requesting surgeon's office via preferred method (i.e, phone, fax) to inform them of need for appointment prior to surgery.  If applicable, this message will also be routed to pharmacy pool and/or primary cardiologist for input on holding anticoagulant/antiplatelet agent as requested below so that this information is available at time of patient's appointment.   Deberah Pelton, NP  04/05/2021, 1:22 PM

## 2021-04-05 NOTE — Telephone Encounter (Signed)
° °  Pre-operative Risk Assessment    Patient Name: [name] [name]  DOB: 1940/04/03 MRN: [id number]      Request for Surgical Clearance    Procedure:   Robotic Partial Colectomy  Date of Surgery:  Clearance TBD                                 Surgeon:  Leighton Ruff Surgeon's Group or Practice Name:  [facility]  Phone number:  [phone] Fax number:  [phone]   Type of Clearance Requested:   - Medical  Pharmacy    Type of Anesthesia:  General    Additional requests/questions:  Please advise surgeon/provider what medications should be held.  Sandrea Hammond   04/05/2021, 12:53 PM

## 2021-04-08 DIAGNOSIS — D649 Anemia, unspecified: Secondary | ICD-10-CM | POA: Diagnosis not present

## 2021-04-08 DIAGNOSIS — D529 Folate deficiency anemia, unspecified: Secondary | ICD-10-CM | POA: Diagnosis not present

## 2021-04-08 DIAGNOSIS — I10 Essential (primary) hypertension: Secondary | ICD-10-CM | POA: Diagnosis not present

## 2021-04-08 DIAGNOSIS — E782 Mixed hyperlipidemia: Secondary | ICD-10-CM | POA: Diagnosis not present

## 2021-04-08 DIAGNOSIS — N182 Chronic kidney disease, stage 2 (mild): Secondary | ICD-10-CM | POA: Diagnosis not present

## 2021-04-08 DIAGNOSIS — E1165 Type 2 diabetes mellitus with hyperglycemia: Secondary | ICD-10-CM | POA: Diagnosis not present

## 2021-04-08 DIAGNOSIS — Z1329 Encounter for screening for other suspected endocrine disorder: Secondary | ICD-10-CM | POA: Diagnosis not present

## 2021-04-08 DIAGNOSIS — E7849 Other hyperlipidemia: Secondary | ICD-10-CM | POA: Diagnosis not present

## 2021-04-08 DIAGNOSIS — K219 Gastro-esophageal reflux disease without esophagitis: Secondary | ICD-10-CM | POA: Diagnosis not present

## 2021-04-08 DIAGNOSIS — D519 Vitamin B12 deficiency anemia, unspecified: Secondary | ICD-10-CM | POA: Diagnosis not present

## 2021-04-10 NOTE — Anesthesia Preprocedure Evaluation (Addendum)
Anesthesia Evaluation  Patient identified by MRN, date of birth, ID band Patient awake    Reviewed: Allergy & Precautions, NPO status , Patient's Chart, lab work & pertinent test results, reviewed documented beta blocker date and time   History of Anesthesia Complications Negative for: history of anesthetic complications  Airway Mallampati: II  TM Distance: >3 FB Neck ROM: Full    Dental  (+) Dental Advisory Given, Partial Lower   Pulmonary neg pulmonary ROS, former smoker,    Pulmonary exam normal        Cardiovascular hypertension, Pt. on medications and Pt. on home beta blockers + CAD, + Past MI and + Cardiac Stents  + dysrhythmias Atrial Fibrillation and Supra Ventricular Tachycardia  Rhythm:Regular Rate:Bradycardia   Hx pericardial effusion s/p window  '20 TTE - EF 70 to 75%. There is moderately to severely increased left ventricular hypertrophy. Grade I diastolic dysfunction (impaired relaxation). Left atrial size was severely dilated. Small, circumferential pericardial effusion. Trivial mitral valve regurgitation. Tricuspid valve regurgitation is trivial. There is restricted motion of the left coronary cusp and associated moderate, nodular calcification in the LVOT. No definite aortic stenosis. Trivial PR. There is mild dilatation of the aortic root   Neuro/Psych  Headaches,  Temporal arteritis  negative psych ROS   GI/Hepatic Neg liver ROS, GERD  Medicated and Controlled,  Endo/Other   Obesity   Renal/GU negative Renal ROS     Musculoskeletal  (+) Arthritis ,   Abdominal   Peds  Hematology  (+) anemia ,  On eliquis    Anesthesia Other Findings   Reproductive/Obstetrics                            Anesthesia Physical Anesthesia Plan  ASA: 3  Anesthesia Plan: MAC   Post-op Pain Management:    Induction:   PONV Risk Score and Plan: 1 and Propofol infusion and Treatment may  vary due to age or medical condition  Airway Management Planned: Nasal Cannula and Natural Airway  Additional Equipment: None  Intra-op Plan:   Post-operative Plan:   Informed Consent: I have reviewed the patients History and Physical, chart, labs and discussed the procedure including the risks, benefits and alternatives for the proposed anesthesia with the patient or authorized representative who has indicated his/her understanding and acceptance.       Plan Discussed with: CRNA and Anesthesiologist  Anesthesia Plan Comments:        Anesthesia Quick Evaluation

## 2021-04-11 ENCOUNTER — Other Ambulatory Visit: Payer: Self-pay

## 2021-04-11 ENCOUNTER — Ambulatory Visit (HOSPITAL_COMMUNITY): Payer: Medicare HMO | Admitting: Anesthesiology

## 2021-04-11 ENCOUNTER — Encounter (HOSPITAL_COMMUNITY): Payer: Self-pay | Admitting: Gastroenterology

## 2021-04-11 ENCOUNTER — Ambulatory Visit (HOSPITAL_COMMUNITY)
Admission: RE | Admit: 2021-04-11 | Discharge: 2021-04-11 | Disposition: A | Discharge status: Home / Self Care | Payer: Medicare HMO | Attending: Gastroenterology | Admitting: Gastroenterology

## 2021-04-11 ENCOUNTER — Encounter (HOSPITAL_COMMUNITY)
Admission: RE | Disposition: A | Discharge status: Home / Self Care | Payer: Self-pay | Source: Home / Self Care | Attending: Gastroenterology

## 2021-04-11 DIAGNOSIS — Z6831 Body mass index (BMI) 31.0-31.9, adult: Secondary | ICD-10-CM | POA: Insufficient documentation

## 2021-04-11 DIAGNOSIS — D1339 Benign neoplasm of other parts of small intestine: Secondary | ICD-10-CM | POA: Diagnosis not present

## 2021-04-11 DIAGNOSIS — I471 Supraventricular tachycardia: Secondary | ICD-10-CM | POA: Insufficient documentation

## 2021-04-11 DIAGNOSIS — K3189 Other diseases of stomach and duodenum: Secondary | ICD-10-CM | POA: Insufficient documentation

## 2021-04-11 DIAGNOSIS — Z87891 Personal history of nicotine dependence: Secondary | ICD-10-CM | POA: Diagnosis not present

## 2021-04-11 DIAGNOSIS — K317 Polyp of stomach and duodenum: Secondary | ICD-10-CM | POA: Diagnosis not present

## 2021-04-11 DIAGNOSIS — I2510 Atherosclerotic heart disease of native coronary artery without angina pectoris: Secondary | ICD-10-CM | POA: Diagnosis not present

## 2021-04-11 DIAGNOSIS — I10 Essential (primary) hypertension: Secondary | ICD-10-CM | POA: Insufficient documentation

## 2021-04-11 DIAGNOSIS — K449 Diaphragmatic hernia without obstruction or gangrene: Secondary | ICD-10-CM | POA: Diagnosis not present

## 2021-04-11 DIAGNOSIS — K219 Gastro-esophageal reflux disease without esophagitis: Secondary | ICD-10-CM | POA: Diagnosis not present

## 2021-04-11 DIAGNOSIS — Z955 Presence of coronary angioplasty implant and graft: Secondary | ICD-10-CM | POA: Diagnosis not present

## 2021-04-11 DIAGNOSIS — I4891 Unspecified atrial fibrillation: Secondary | ICD-10-CM | POA: Insufficient documentation

## 2021-04-11 DIAGNOSIS — E669 Obesity, unspecified: Secondary | ICD-10-CM | POA: Diagnosis not present

## 2021-04-11 DIAGNOSIS — D649 Anemia, unspecified: Secondary | ICD-10-CM | POA: Insufficient documentation

## 2021-04-11 DIAGNOSIS — M1990 Unspecified osteoarthritis, unspecified site: Secondary | ICD-10-CM | POA: Insufficient documentation

## 2021-04-11 DIAGNOSIS — D132 Benign neoplasm of duodenum: Secondary | ICD-10-CM

## 2021-04-11 DIAGNOSIS — I252 Old myocardial infarction: Secondary | ICD-10-CM | POA: Insufficient documentation

## 2021-04-11 DIAGNOSIS — K319 Disease of stomach and duodenum, unspecified: Secondary | ICD-10-CM | POA: Diagnosis not present

## 2021-04-11 DIAGNOSIS — K2980 Duodenitis without bleeding: Secondary | ICD-10-CM | POA: Diagnosis not present

## 2021-04-11 DIAGNOSIS — Z7901 Long term (current) use of anticoagulants: Secondary | ICD-10-CM | POA: Insufficient documentation

## 2021-04-11 DIAGNOSIS — K2970 Gastritis, unspecified, without bleeding: Secondary | ICD-10-CM | POA: Diagnosis not present

## 2021-04-11 DIAGNOSIS — M316 Other giant cell arteritis: Secondary | ICD-10-CM | POA: Insufficient documentation

## 2021-04-11 DIAGNOSIS — K2289 Other specified disease of esophagus: Secondary | ICD-10-CM | POA: Insufficient documentation

## 2021-04-11 DIAGNOSIS — C189 Malignant neoplasm of colon, unspecified: Secondary | ICD-10-CM | POA: Insufficient documentation

## 2021-04-11 HISTORY — PX: SUBMUCOSAL LIFTING INJECTION: SHX6855

## 2021-04-11 HISTORY — PX: HEMOSTASIS CLIP PLACEMENT: SHX6857

## 2021-04-11 HISTORY — PX: BIOPSY: SHX5522

## 2021-04-11 HISTORY — PX: SUBMUCOSAL TATTOO INJECTION: SHX6856

## 2021-04-11 HISTORY — PX: ENDOSCOPIC MUCOSAL RESECTION: SHX6839

## 2021-04-11 HISTORY — PX: ENTEROSCOPY: SHX5533

## 2021-04-11 LAB — GLUCOSE, CAPILLARY: Glucose-Capillary: 131 mg/dL — ABNORMAL HIGH (ref 70–99)

## 2021-04-11 SURGERY — ENTEROSCOPY
Anesthesia: Monitor Anesthesia Care

## 2021-04-11 MED ORDER — PROPOFOL 500 MG/50ML IV EMUL
INTRAVENOUS | Status: DC | PRN
  Administered 2021-04-11: 30 mg via INTRAVENOUS

## 2021-04-11 MED ORDER — SODIUM CHLORIDE 0.9 % IV SOLN: INTRAVENOUS | Status: DC

## 2021-04-11 MED ORDER — APIXABAN 5 MG PO TABS: 5.0000 mg | ORAL_TABLET | Freq: Two times a day (BID) | ORAL | 6 refills | Status: DC

## 2021-04-11 MED ORDER — LIDOCAINE HCL (CARDIAC) PF 100 MG/5ML IV SOSY
PREFILLED_SYRINGE | INTRAVENOUS | Status: DC | PRN
  Administered 2021-04-11: 60 mg via INTRAVENOUS

## 2021-04-11 MED ORDER — EPHEDRINE SULFATE 50 MG/ML IJ SOLN
INTRAMUSCULAR | Status: DC | PRN
  Administered 2021-04-11: 7.5 mg via INTRAVENOUS

## 2021-04-11 MED ORDER — PANTOPRAZOLE SODIUM 40 MG PO TBEC: 40.0000 mg | DELAYED_RELEASE_TABLET | Freq: Two times a day (BID) | ORAL | 3 refills | Status: DC

## 2021-04-11 MED ORDER — PROPOFOL 1000 MG/100ML IV EMUL
INTRAVENOUS | Status: AC
  Filled 2021-04-11: qty 200

## 2021-04-11 MED ORDER — PROPOFOL 500 MG/50ML IV EMUL
INTRAVENOUS | Status: DC | PRN
  Administered 2021-04-11: 125 ug/kg/min via INTRAVENOUS

## 2021-04-11 MED ORDER — LACTATED RINGERS IV SOLN: INTRAVENOUS | Status: DC | PRN

## 2021-04-11 MED ORDER — SPOT INK MARKER SYRINGE KIT
PACK | SUBMUCOSAL | Status: DC | PRN
  Administered 2021-04-11: 1 mL via SUBMUCOSAL

## 2021-04-11 MED ORDER — LACTATED RINGERS IV SOLN
INTRAVENOUS | Status: DC
  Administered 2021-04-11: 1000 mL via INTRAVENOUS

## 2021-04-11 MED ORDER — PROPOFOL 500 MG/50ML IV EMUL
INTRAVENOUS | Status: AC
  Filled 2021-04-11: qty 50

## 2021-04-11 NOTE — Transfer of Care (Signed)
Immediate Anesthesia Transfer of Care Note  Patient: [name] [name]  Procedure(s) Performed: Procedure(s): ENTEROSCOPY (N/A) ENDOSCOPIC MUCOSAL RESECTION (N/A) BIOPSY SUBMUCOSAL LIFTING INJECTION HEMOSTASIS CLIP PLACEMENT SUBMUCOSAL TATTOO INJECTION  Patient Location: PACU  Anesthesia Type:General  Level of Consciousness:  sedated, patient cooperative and responds to stimulation  Airway & Oxygen Therapy:Patient Spontanous Breathing and Patient connected to face mask oxgen  Post-op Assessment:  Report given to PACU RN and Post -op Vital signs reviewed and stable  Post vital signs:  Reviewed and stable  Last Vitals:  Vitals:   04/11/21 0639 04/11/21 0825  BP: (!) 134/47 (!) 99/40  Pulse: (!) 52 (!) 50  Resp: 12 12  Temp: 36.5 C (!) 36.4 C  SpO2: 32% 919%    Complications: No apparent anesthesia complications

## 2021-04-11 NOTE — H&P (Signed)
GASTROENTEROLOGY PROCEDURE H&P NOTE   Primary Care Physician: Caryl Bis, MD  HPI: [name] [name] is a 81 y.o. male who presents for small bowel adenoma as well as colon cancer.  Here for attempt at advanced resection of the small bowel adenoma via endoscopy/enteroscopy.  Past Medical History:  Diagnosis Date   Anemia    Arthritis    CAD (coronary artery disease)    DES to circumflex 12/2017   GERD (gastroesophageal reflux disease)    Glaucoma    Gout    Headache    History of kidney stones    Hypertension    NSTEMI (non-ST elevated myocardial infarction) ([place]) 12/31/2017   Paroxysmal atrial fibrillation (HCC)    Pericardial effusion    Idiopathic, recurrent pericardial effusion s/p pericardial window.   Prostate cancer ([place]) 2007   S/P Seed implants    Supraventricular tachycardia ([place])    Temporal arteritis ([place])    Past Surgical History:  Procedure Laterality Date   BIOPSY  10/12/2020   Procedure: BIOPSY;  Surgeon: [name], MD;  [facility];  Service: Gastroenterology;;   BIOPSY  02/25/2021   Procedure: BIOPSY;  Surgeon: [name], MD;  [facility];  Service: Gastroenterology;;   CATARACT EXTRACTION W/PHACO Left 06/07/2015   Procedure: CATARACT EXTRACTION PHACO AND INTRAOCULAR LENS PLACEMENT LEFT EYE CDE=8.00;  Surgeon: Tonny Branch, MD;  Location: [facility];  Service: Ophthalmology;  Laterality: Left;   CATARACT EXTRACTION W/PHACO Right 06/17/2015   Procedure: CATARACT EXTRACTION PHACO AND INTRAOCULAR LENS PLACEMENT RIGHT EYE CDE=11.09;  Surgeon: Tonny Branch, MD;  Location: [facility];  Service: Ophthalmology;  Laterality: Right;   COLONOSCOPY WITH PROPOFOL N/A 10/12/2020   Procedure: COLONOSCOPY WITH PROPOFOL;  Surgeon: [name], MD;  [facility];  Service: Gastroenterology;  Laterality: N/A;  10:35   COLONOSCOPY WITH PROPOFOL N/A 02/25/2021   Procedure: COLONOSCOPY WITH PROPOFOL;  Surgeon:  [name], MD;  [facility];  Service: Gastroenterology;  Laterality: N/A;  8:10   CORONARY ANGIOPLASTY WITH STENT PLACEMENT  01/01/2018   CORONARY STENT INTERVENTION N/A 01/01/2018   Procedure: CORONARY STENT INTERVENTION;  Surgeon: Jettie Booze, MD;  Location: [place] [facility];  Service: Cardiovascular;  Laterality: N/A;   ENTEROSCOPY N/A 10/12/2020   Procedure: PUSH ENTEROSCOPY;  Surgeon: [name], MD;  [facility];  Service: Gastroenterology;  Laterality: N/A;   ESOPHAGOGASTRODUODENOSCOPY (EGD) WITH PROPOFOL N/A 10/12/2020   Procedure: ESOPHAGOGASTRODUODENOSCOPY (EGD) WITH PROPOFOL;  Surgeon: [name], MD;  [facility];  Service: Gastroenterology;  Laterality: N/A;   FRACTURE SURGERY Left 2010   ankle   INSERTION PROSTATE RADIATION SEED  2007   KNEE ARTHROSCOPY Left    LEFT HEART CATH AND CORONARY ANGIOGRAPHY N/A 01/01/2018   Procedure: LEFT HEART CATH AND CORONARY ANGIOGRAPHY;  Surgeon: Jettie Booze, MD;  Location: [place] [facility];  Service: Cardiovascular;  Laterality: N/A;   ORIF TIBIA & FIBULA FRACTURES Left 2003   Distal tibial/fibula    PERICARDIAL WINDOW  02/2007   PERICARDIOCENTESIS  2007   hx/notes 10/19/2011   POLYPECTOMY  10/12/2020   Procedure: POLYPECTOMY;  Surgeon: [name], MD;  [facility];  Service: Gastroenterology;;  small bowel, cecal   POLYPECTOMY  02/25/2021   Procedure: POLYPECTOMY;  Surgeon: [name], MD;  [facility];  Service: Gastroenterology;;  transverse colon x2   Current Facility-Administered Medications  Medication  Dose Route Frequency Provider Last Rate Last Admin   0.9 %  sodium chloride infusion   Intravenous Continuous Mansouraty, Telford Nab., MD       lactated ringers infusion   Intravenous Continuous Mansouraty, Telford Nab., MD 10 mL/hr at 04/11/21 0655 1,000 mL at 04/11/21 0655     Current Facility-Administered Medications:    0.9 %  sodium chloride infusion, , Intravenous, Continuous, Mansouraty, Telford Nab., MD   lactated ringers infusion, , Intravenous, Continuous, Mansouraty, Telford Nab., MD, Last Rate: 10 mL/hr at 04/11/21 0655, 1,000 mL at 04/11/21 1191 Allergies  Allergen Reactions   Amiodarone Rash   Family History  Problem Relation Age of Onset   CAD Mother        MI in 39s   Prostate cancer Father    Prostate cancer Brother    Lung cancer Brother    Social History   Socioeconomic History   Marital status: Married    Spouse name: Not on file   Number of children: Not on file   Years of education: Not on file   Highest education level: Not on file  Occupational History   Not on file  Tobacco Use   Smoking status: Former    Packs/day: 0.30    Years: 10.00    Pack years: 3.00    Types: Cigarettes    Start date: 08/06/1958    Quit date: 1968    Years since quitting: 55.0   Smokeless tobacco: Never  Vaping Use   Vaping Use: Never used  Substance and Sexual Activity   Alcohol use: Not Currently   Drug use: Never   Sexual activity: Yes  Other Topics Concern   Not on file  Social History Narrative   Not on file   Social Determinants of Health   Financial Resource Strain: Not on file  Food Insecurity: Not on file  Transportation Needs: Not on file  Physical Activity: Not on file  Stress: Not on file  Social Connections: Not on file  Intimate Partner Violence: Not on file    Physical Exam: Today's Vitals   04/11/21 0639  BP: (!) 134/47  Pulse: (!) 52  Resp: 12  Temp: 97.7 F (36.5 C)  TempSrc: Oral  SpO2: 97%  Weight: 98.2 kg  Height: 5\' 10"  (1.778 m)  PainSc: 0-No pain   Body mass index is 31.06 kg/m. GEN: NAD EYE: Sclerae anicteric ENT: MMM CV: Non-tachycardic GI: Soft, NT/ND NEURO:  Alert & Oriented x 3  Lab Results: No results for input(s): WBC, HGB, HCT, PLT in the last 72 hours. BMET No results for  input(s): NA, K, CL, CO2, GLUCOSE, BUN, CREATININE, CALCIUM in the last 72 hours. LFT No results for input(s): PROT, ALBUMIN, AST, ALT, ALKPHOS, BILITOT, BILIDIR, IBILI in the last 72 hours. PT/INR No results for input(s): LABPROT, INR in the last 72 hours.   Impression / Plan: This is a 81 y.o.male who presents for small bowel adenoma as well as colon cancer.  Here for attempt at advanced resection of the small bowel adenoma via endoscopy/enteroscopy.  Based upon the description and endoscopic pictures I do feel that it is reasonable to pursue an Advanced Polypectomy attempt of the polyp/lesion.  We discussed some of the techniques of advanced polypectomy which include Endoscopic Mucosal Resection, OVESCO Full-Thickness Resection, Endorotor Morcellation, and Tissue Ablation via Fulguration.  We also reviewed images of typical techniques as noted above.  The risks and benefits of endoscopic evaluation were discussed with the patient;  these include but are not limited to the risk of perforation, infection, bleeding, missed lesions, lack of diagnosis, severe illness requiring hospitalization, as well as anesthesia and sedation related illnesses.  During attempts at advanced resection, the risks of bleeding and perforation/leak are increased as opposed to diagnostic and screening procedures, and that was discussed with the patient as well.   In addition, I explained that with the possible need for piecemeal resection, subsequent short-interval endoscopic evaluation for follow up and potential retreatment of the lesion/area may be necessary.  If, after attempt at removal of the polyp/lesion, it is found that the patient has a complication or that an invasive lesion or malignant lesion is found, or that the polyp/lesion continues to recur, the patient is aware and understands that surgery may still be indicated/required.  All patient questions were answered, to the best of my ability, and the patient agrees to  the aforementioned plan of action with follow-up as indicated.    Justice Britain, MD [facility] Office # [phone]

## 2021-04-11 NOTE — Anesthesia Postprocedure Evaluation (Signed)
Anesthesia Post Note  Patient: [name] [name]  Procedure(s) Performed: ENTEROSCOPY ENDOSCOPIC MUCOSAL RESECTION BIOPSY SUBMUCOSAL LIFTING INJECTION HEMOSTASIS CLIP PLACEMENT SUBMUCOSAL TATTOO INJECTION     Patient location during evaluation: PACU Anesthesia Type: MAC Level of consciousness: awake and alert Pain management: pain level controlled Vital Signs Assessment: post-procedure vital signs reviewed and stable Respiratory status: spontaneous breathing, nonlabored ventilation and respiratory function stable Cardiovascular status: stable and blood pressure returned to baseline Anesthetic complications: no   No notable events documented.  Last Vitals:  Vitals:   04/11/21 0845 04/11/21 0855  BP: (!) 111/53 110/65  Pulse: (!) 56 (!) 55  Resp: 14 16  Temp:    SpO2: 95% 95%    Last Pain:  Vitals:   04/11/21 0855  TempSrc:   PainSc: 0-No pain                 Audry Pili

## 2021-04-11 NOTE — Op Note (Signed)
[facility] Patient Name: [name] [name] Procedure Date: 04/11/2021 MRN: [id number] Attending MD: Justice Britain , MD Date of Birth: [birth year] CSN: [id number] Age: 81 Admit Type: Outpatient Procedure:                Small bowel enteroscopy Indications:              For therapy of adenomatous polyps in the jejunum Providers:                Justice Britain, MD, Carlyn Reichert, RN, Cherylynn Ridges, Technician, Arnoldo Hooker, CRNA Referring MD:             Maylon Peppers, Mitzie Na [name] MD, MD, Leighton Ruff, MD, Derek Jack, MD Medicines:                Monitored Anesthesia Care Complications:            No immediate complications. Estimated Blood Loss:     Estimated blood loss was minimal. Procedure:                Pre-Anesthesia Assessment:                           - Prior to the procedure, a History and Physical                            was performed, and patient medications and                            allergies were reviewed. The patient's tolerance of                            previous anesthesia was also reviewed. The risks                            and benefits of the procedure and the sedation                            options and risks were discussed with the patient.                            All questions were answered, and informed consent                            was obtained. Prior Anticoagulants: The patient has                            taken Plavix (clopidogrel), last dose was 5 days                            prior to procedure. ASA Grade Assessment: III - A  patient with severe systemic disease. After                            reviewing the risks and benefits, the patient was                            deemed in satisfactory condition to undergo the                            procedure.                           After obtaining informed consent, the  endoscope was                            passed under direct vision. Throughout the                            procedure, the patient's blood pressure, pulse, and                            oxygen saturations were monitored continuously. The                            [id number]-HQ190L ([id number]) Olympus colonoscope was                            introduced through the mouth and advanced to the                            proximal jejunum. The [id number]-[id number] ([id number]) Olympus                            duodenoscope was introduced through the mouth and                            advanced to the area of papilla. The small bowel                            enteroscopy was accomplished without difficulty.                            The patient tolerated the procedure. Scope In: Scope Out: Findings:      No gross lesions were noted in the entire esophagus.      The Z-line was irregular and was found 41 cm from the incisors.      A 2 cm hiatal hernia was present.      Segmental striped moderate inflammation characterized by erythema was       found in the gastric antrum.      No other gross lesions were noted in the entire examined stomach.       Biopsies were taken with a cold forceps for histology and Helicobacter       pylori testing.      Mucosal changes characterized by melanosis were found in the duodenal  bulb.      Localized mild mucosal changes characterized by altered texture were       found in the area of the minor papilla. Biopsies were taken with a cold       forceps for histology to rule out adenoma.      A single 10 mm semi-sessile polypoid appearing ampulla was found.       Biopsies were taken with a cold forceps for histology to rule out       adenomatous change.      Normal mucosa was found in the entire duodenum otherwise.      A 16 mm semi-sessile polyp with was found in the proximal jejunum.       Preparations were made for mucosal resection. NBI imaging and       White-light  endoscopy was done to demarcate the borders of the lesion.       Olympus Endoclot SIS was injected to raise the lesion (total of 3 cc).       Piecemeal mucosal resection using a cold snare was performed. Resection       and retrieval were complete. To prevent bleeding after mucosal       resection, three hemostatic clips were successfully placed (MR       conditional). There was no bleeding at the end of the procedure. Area       was tattooed proximal to the resection with an injection of Spot (carbon       black).      Normal mucosa was found in the visualized jejunum otherwise. Impression:               - No gross lesions in esophagus. Z-line irregular,                            41 cm from the incisors.                           - 2 cm hiatal hernia.                           - Striped gastritis in antrum. No other gross                            lesions in the stomach. Biopsied.                           - Melanosis noted in bulb.                           - Mucosal changes at area of the presumed minor                            ampulla noted and biopsied.                           - Ampulla was noted with potential polypoid                            adenomatous appearance - this was biopsied.                           -  Normal mucosa was found in the entire examined                            duodenum otherwise.                           - Jejunal polyp noted. Resected and retrieved via                            piecemeal cold mucosal resection. Clips (MR                            conditional) were placed. Tattooed proximal to the                            resection.                           - Normal mucosa was found in the jejunum otherwise. Recommendation:           - The patient will be observed post-procedure,                            until all discharge criteria are met.                           - Discharge patient to home.                           - Patient has a  contact number available for                            emergencies. The signs and symptoms of potential                            delayed complications were discussed with the                            patient. Return to normal activities tomorrow.                            Written discharge instructions were provided to the                            patient.                           - Resume previous diet.                           - Increase Protonix to 40 mg twice daily for 1                            month then may decrease back to 40 mg daily.                           -  Restart Eliquis in 48 hours (1/18 AM) to decrease                            risk of post-interventional bleeding.                           - Continue present medications.                           - Await pathology results.                           - Repeat the small bowel enteroscopy in 1 year for                            surveillance of the jejunal adenoma resection site,                            if no evidence of any other adenomatous change is                            found. If there is evidence of adenomatous change                            in either the minor or major regions then we will                            require additional workup and management.                           - The findings and recommendations were discussed                            with the patient.                           - The findings and recommendations were discussed                            with the patient's family. Procedure Code(s):        --- Professional ---                           [phone], Small intestinal endoscopy, enteroscopy                            beyond second portion of duodenum, not including                            ileum; with removal of tumor(s), polyp(s), or other                            lesion(s) by snare technique  44799, Unlisted procedure, small  intestine Diagnosis Code(s):        --- Professional ---                           K22.8, Other specified diseases of esophagus                           K44.9, Diaphragmatic hernia without obstruction or                            gangrene                           K29.70, Gastritis, unspecified, without bleeding                           K31.89, Other diseases of stomach and duodenum                           K31.7, Polyp of stomach and duodenum                           D13.39, Benign neoplasm of other parts of small                            intestine CPT copyright 2019 [employer]. All rights reserved. The codes documented in this report are preliminary and upon coder review may  be revised to meet current compliance requirements. Justice Britain, MD 04/11/2021 8:51:51 AM Number of Addenda: 0

## 2021-04-11 NOTE — Discharge Instructions (Signed)
YOU HAD AN ENDOSCOPIC PROCEDURE TODAY: Refer to the procedure report and other information in the discharge instructions given to you for any specific questions about what was found during the examination. If this information does not answer your questions, please call [place] office at [phone] to clarify.  ° °YOU SHOULD EXPECT: Some feelings of bloating in the abdomen. Passage of more gas than usual. Walking can help get rid of the air that was put into your GI tract during the procedure and reduce the bloating. If you had a lower endoscopy (such as a colonoscopy or flexible sigmoidoscopy) you may notice spotting of blood in your stool or on the toilet paper. Some abdominal soreness may be present for a day or two, also. ° °DIET: Your first meal following the procedure should be a light meal and then it is ok to progress to your normal diet. A half-sandwich or bowl of soup is an example of a good first meal. Heavy or fried foods are harder to digest and may make you feel nauseous or bloated. Drink plenty of fluids but you should avoid alcoholic beverages for 24 hours. If you had a esophageal dilation, please see attached instructions for diet.   ° °ACTIVITY: Your care partner should take you home directly after the procedure. You should plan to take it easy, moving slowly for the rest of the day. You can resume normal activity the day after the procedure however YOU SHOULD NOT DRIVE, use power tools, machinery or perform tasks that involve climbing or major physical exertion for 24 hours (because of the sedation medicines used during the test).  ° °SYMPTOMS TO REPORT IMMEDIATELY: °A gastroenterologist can be reached at any hour. Please call [phone]  for any of the following symptoms:  °Following lower endoscopy (colonoscopy, flexible sigmoidoscopy) °Excessive amounts of blood in the stool  °Significant tenderness, worsening of abdominal pains  °Swelling of the abdomen that is new, acute  °Fever of 100° or  higher  °Following upper endoscopy (EGD, EUS, ERCP, esophageal dilation) °Vomiting of blood or coffee ground material  °New, significant abdominal pain  °New, significant chest pain or pain under the shoulder blades  °Painful or persistently difficult swallowing  °New shortness of breath  °Black, tarry-looking or red, bloody stools ° °FOLLOW UP:  °If any biopsies were taken you will be contacted by phone or by letter within the next 1-3 weeks. Call [phone]  if you have not heard about the biopsies in 3 weeks.  °Please also call with any specific questions about appointments or follow up tests. ° °

## 2021-04-12 ENCOUNTER — Encounter (HOSPITAL_COMMUNITY): Payer: Self-pay | Admitting: Gastroenterology

## 2021-04-12 LAB — SURGICAL PATHOLOGY

## 2021-04-13 ENCOUNTER — Encounter: Payer: Self-pay | Admitting: Gastroenterology

## 2021-04-13 DIAGNOSIS — I10 Essential (primary) hypertension: Secondary | ICD-10-CM | POA: Diagnosis not present

## 2021-04-13 DIAGNOSIS — Z23 Encounter for immunization: Secondary | ICD-10-CM | POA: Diagnosis not present

## 2021-04-13 DIAGNOSIS — C189 Malignant neoplasm of colon, unspecified: Secondary | ICD-10-CM | POA: Diagnosis not present

## 2021-04-13 DIAGNOSIS — E7849 Other hyperlipidemia: Secondary | ICD-10-CM | POA: Diagnosis not present

## 2021-04-13 DIAGNOSIS — Z0001 Encounter for general adult medical examination with abnormal findings: Secondary | ICD-10-CM | POA: Diagnosis not present

## 2021-04-13 DIAGNOSIS — E1169 Type 2 diabetes mellitus with other specified complication: Secondary | ICD-10-CM | POA: Diagnosis not present

## 2021-04-13 DIAGNOSIS — D509 Iron deficiency anemia, unspecified: Secondary | ICD-10-CM | POA: Diagnosis not present

## 2021-04-13 DIAGNOSIS — I4891 Unspecified atrial fibrillation: Secondary | ICD-10-CM | POA: Diagnosis not present

## 2021-04-19 ENCOUNTER — Other Ambulatory Visit: Payer: Self-pay

## 2021-04-19 ENCOUNTER — Encounter: Payer: Self-pay | Admitting: Physician Assistant

## 2021-04-19 ENCOUNTER — Ambulatory Visit: Payer: Medicare HMO | Admitting: Physician Assistant

## 2021-04-19 VITALS — BP 130/52 | HR 53 | Ht 71.0 in | Wt 214.0 lb

## 2021-04-19 DIAGNOSIS — Z01810 Encounter for preprocedural cardiovascular examination: Secondary | ICD-10-CM

## 2021-04-19 DIAGNOSIS — I10 Essential (primary) hypertension: Secondary | ICD-10-CM | POA: Insufficient documentation

## 2021-04-19 DIAGNOSIS — I471 Supraventricular tachycardia: Secondary | ICD-10-CM

## 2021-04-19 DIAGNOSIS — I2510 Atherosclerotic heart disease of native coronary artery without angina pectoris: Secondary | ICD-10-CM

## 2021-04-19 DIAGNOSIS — I480 Paroxysmal atrial fibrillation: Secondary | ICD-10-CM | POA: Diagnosis not present

## 2021-04-19 DIAGNOSIS — Z01818 Encounter for other preprocedural examination: Secondary | ICD-10-CM | POA: Insufficient documentation

## 2021-04-19 NOTE — Telephone Encounter (Signed)
Pt being seen today for surgical clearance.  He needs partial colectomy and is on Apixaban. Recent Labs    02/23/21 1131  CREATININE 0.84  HGB 12.1*  PLT 163     CHA2DS2-VASc Score = 5   This indicates a 7.2% annual risk of stroke. The patient's score is based upon: CHF History: 0 HTN History: 1 Diabetes History: 1 Stroke History: 0 Vascular Disease History: 1 Age Score: 2 Gender Score: 0      PLAN:  Routed to pharmacy to address holding anticoagulation for upcoming surgery. Richardson Dopp, PA-C    04/19/2021 7:45 AM

## 2021-04-19 NOTE — Telephone Encounter (Signed)
Patient with diagnosis of PAF on Eliquis for anticoagulation.    Procedure: Robotic Partial Colectomy   Date of procedure: TBD  CHA2DS2-VASc Score = 5  This indicates a 7.2% annual risk of stroke. The patient's score is based upon: CHF History: 0 HTN History: 1 Diabetes History: 1 Stroke History: 0 Vascular Disease History: 1 Age Score: 2 Gender Score: 0    CrCl 97 mL/min Platelet count 163K  Per office protocol, patient can hold Eliquis for 3 days prior to procedure.

## 2021-04-19 NOTE — Patient Instructions (Addendum)
Medication Instructions:   Your physician recommends that you continue on your current medications as directed. Please refer to the Current Medication list given to you today.  *If you need a refill on your cardiac medications before your next appointment, please call your pharmacy*   Lab Work:  None ordered.  If you have labs (blood work) drawn today and your tests are completely normal, you will receive your results only by: [place] (if you have MyChart) OR A paper copy in the mail If you have any lab test that is abnormal or we need to change your treatment, we will call you to review the results.   Testing/Procedures:  None ordered.   Follow-Up: At [facility], you and your health needs are our priority.  As part of our continuing mission to provide you with exceptional heart care, we have created designated Provider Care Teams.  These Care Teams include your primary Cardiologist (physician) and Advanced Practice Providers (APPs -  Physician Assistants and Nurse Practitioners) who all work together to provide you with the care you need, when you need it.  We recommend signing up for the patient portal called "MyChart".  Sign up information is provided on this After Visit Summary.  MyChart is used to connect with patients for Virtual Visits (Telemedicine).  Patients are able to view lab/test results, encounter notes, upcoming appointments, etc.  Non-urgent messages can be sent to your provider as well.   To learn more about what you can do with MyChart, go to [email or web address].    Your next appointment:   6 month(s)  The format for your next appointment:   In Person  Provider:   Rozann Lesches, MD    Other Instructions  Your physician wants you to follow-up in: 6 month with Dr. Domenic Polite.  You will receive a reminder letter in the mail two months in advance. If you don't receive a letter, please call our office to schedule the follow-up appointment.  Hold  Eliquis 3 days prior to surgery.

## 2021-04-19 NOTE — Assessment & Plan Note (Signed)
Maintaining sinus rhythm.  He is tolerating anticoagulation with Apixaban.  Labs from [place] personally reviewed and interpreted: 04/08/2021-Hgb 13.2, creatinine 1.03.  Continue apixaban 5 mg twice daily, diltiazem 180 mg daily, metoprolol succinate 50 mg daily.  As noted, he will hold his apixaban for 3 days prior to surgery.

## 2021-04-19 NOTE — Assessment & Plan Note (Addendum)
[name] [name]'s perioperative risk of a major cardiac event is 6.6% according to the Revised Cardiac Risk Index (RCRI).  Therefore, he is at high risk for perioperative complications.   His functional capacity is good at 4.4 METs according to the Duke Activity Status Index (DASI). Recommendations: According to ACC/AHA guidelines, no further cardiovascular testing needed.  The patient may proceed to surgery at acceptable risk.   Antiplatelet and/or Anticoagulation Recommendations: Eliquis (Apixaban) can be held for 3 days prior to surgery.  Please resume post op when felt to be safe.

## 2021-04-19 NOTE — Progress Notes (Signed)
Cardiology Office Note:    Date:  04/19/2021   ID:  [name] [name], DOB 06/27/1940, MRN [id number]  PCP:  Caryl Bis, MD  [facility] Providers Cardiologist:  Rozann Lesches, MD     Referring MD: Caryl Bis, MD   Chief Complaint:  Surgical Clearance    Patient Profile: Coronary artery disease NSTEMI S/p DES to LCx 12/2017 Paroxsymal atrial fibrillation  Supraventricular Tachycardia  Hypertension  Hyperlipidemia  Pericardial effusion s/p pericardial window 02/2007 Prostate CA Temporal arteritis  Gout  Anemia   Prior CV Studies: Echocardiogram 01/03/2019 EF 70-75, moderate to severe LVH, GR 1 DD, normal RVSF, severe LAE, normal RAE, small pericardial effusion, trivial MR, trivial TR, trivial PI, mild dilation of aortic root  LEFT HEART CATH 01/01/2018 Narrative  Mid Cx lesion is 95% stenosed.  Prox LAD lesion is 25% stenosed.  Mid LAD lesion is 30% stenosed.  Prox RCA to Mid RCA lesion is 50% stenosed.  EF 50-55  PCI: STENT SIERRA 3.25 X 15 MM to the mid LCx    History of Present Illness:   [name] [name] is a 81 y.o. male with the above problem list.  He was last seen by Dr. Domenic Polite in 5/22.  He returns for sugecal clearance.  He needs partial colectomy under gen anesthesia with Dr. Marcello Moores for colon CA. He is here with his wife.  Overall, he has been doing well without chest pain, significant shortness of breath, syncope, orthopnea, leg edema.  He remains very active.  He walks 2 miles a day.  He still chops wood.  He does a lot of housework including vacuuming.  He has no issues with chest pain or shortness of breath with these activities.    Past Medical History:  Diagnosis Date   Anemia    Arthritis    CAD (coronary artery disease)    DES to circumflex 12/2017   GERD (gastroesophageal reflux disease)    Glaucoma    Gout    Headache    History of kidney stones    Hypertension    NSTEMI (non-ST elevated myocardial infarction) ([place]) 12/31/2017    Paroxysmal atrial fibrillation (HCC)    Pericardial effusion    Idiopathic, recurrent pericardial effusion s/p pericardial window.   Prostate cancer ([place]) 2007   S/P Seed implants    Supraventricular tachycardia (HCC)    Temporal arteritis (HCC)    Current Medications: Current Meds  Medication Sig   allopurinol (ZYLOPRIM) 300 MG tablet Take 450 mg by mouth daily.    apixaban (ELIQUIS) 5 MG TABS tablet Take 1 tablet (5 mg total) by mouth 2 (two) times daily.   atorvastatin (LIPITOR) 80 MG tablet TAKE 1 TABLET BY MOUTH ONCE DAILY AT  6  PM   benazepril (LOTENSIN) 40 MG tablet Take 1 tablet by mouth once daily   diltiazem (CARDIZEM CD) 180 MG 24 hr capsule Take 1 capsule (180 mg total) by mouth daily.   dorzolamide (TRUSOPT) 2 % ophthalmic solution 1 drop 2 (two) times daily.   FERREX 150 150 MG capsule Take 150 mg by mouth 2 (two) times daily.   hydrALAZINE (APRESOLINE) 100 MG tablet Take 100 mg by mouth 2 (two) times daily.   hydrochlorothiazide (HYDRODIURIL) 12.5 MG tablet Take 12.5 mg by mouth daily.   latanoprost (XALATAN) 0.005 % ophthalmic solution Place 1 drop into both eyes at bedtime.   metFORMIN (GLUCOPHAGE-XR) 500 MG 24 hr tablet Take 500 mg by mouth 2 (two) times daily.  metoprolol succinate (TOPROL-XL) 50 MG 24 hr tablet Take 50 mg by mouth daily. Take with or immediately following a meal.   Multiple Vitamin (MULTIVITAMIN) tablet Take 1 tablet by mouth in the morning.   pantoprazole (PROTONIX) 40 MG tablet Take 1 tablet (40 mg total) by mouth 2 (two) times daily before a meal.   vitamin C (ASCORBIC ACID) 500 MG tablet Take 500 mg by mouth 2 (two) times daily.     Allergies:   Amiodarone   Social History   Tobacco Use   Smoking status: Former    Packs/day: 0.30    Years: 10.00    Pack years: 3.00    Types: Cigarettes    Start date: 08/06/1958    Quit date: 1968    Years since quitting: 55.1   Smokeless tobacco: Never  Vaping Use   Vaping Use: Never used  Substance  Use Topics   Alcohol use: Not Currently   Drug use: Never    Family Hx: The patient's family history includes CAD in his mother; Lung cancer in his brother; Prostate cancer in his brother and father.  Review of Systems  Cardiovascular:  Negative for claudication.  Gastrointestinal:  Negative for hematochezia.  Genitourinary:  Negative for hematuria.    EKGs/Labs/Other Test Reviewed:    EKG:  EKG is   ordered today.  The ekg ordered today demonstrates sinus bradycardia, HR 53, inferior Q waves, RSR prime in V1, low voltage, QTC 433, no change from prior tracing  Recent Labs: 02/23/2021: BUN 20; Creatinine, Ser 0.84; Hemoglobin 12.1; Platelets 163; Potassium 3.6; Sodium 138 03/08/2021: ALT 24   Recent Lipid Panel No results for input(s): CHOL, TRIG, HDL, VLDL, LDLCALC, LDLDIRECT in the last 8760 hours.   Risk Assessment/Calculations:    CHA2DS2-VASc Score = 5   This indicates a 7.2% annual risk of stroke. The patient's score is based upon: CHF History: 0 HTN History: 1 Diabetes History: 1 Stroke History: 0 Vascular Disease History: 1 Age Score: 2 Gender Score: 0        Physical Exam:    VS:  BP (!) 130/52 (BP Location: Right Arm)    Pulse (!) 53    Ht 5\' 11"  (1.803 m)    Wt 214 lb (97.1 kg)    SpO2 97%    BMI 29.85 kg/m     Wt Readings from Last 3 Encounters:  04/19/21 214 lb (97.1 kg)  04/11/21 216 lb 7.9 oz (98.2 kg)  03/22/21 216 lb 9.6 oz (98.2 kg)    Constitutional:      Appearance: Healthy appearance. Not in distress.  Neck:     Vascular: No JVR. JVD normal.  Pulmonary:     Effort: Pulmonary effort is normal.     Breath sounds: No wheezing. No rales.  Cardiovascular:     Normal rate. Regular rhythm. Normal S1. Normal S2.      Murmurs: There is a grade 1/6 systolic murmur at the URSB.  Edema:    Peripheral edema present.    Pretibial: bilateral trace edema of the pretibial area. Abdominal:     Palpations: Abdomen is soft.  Skin:    General: Skin is  warm and dry.  Neurological:     General: No focal deficit present.     Mental Status: Alert and oriented to person, place and time.     Cranial Nerves: Cranial nerves are intact.       ASSESSMENT & PLAN:   Preoperative cardiovascular examination Mr. Baney  perioperative risk of a major cardiac event is 6.6% according to the Revised Cardiac Risk Index (RCRI).  Therefore, he is at high risk for perioperative complications.   His functional capacity is good at 4.4 METs according to the Duke Activity Status Index (DASI). Recommendations: According to ACC/AHA guidelines, no further cardiovascular testing needed.  The patient may proceed to surgery at acceptable risk.   Antiplatelet and/or Anticoagulation Recommendations: Eliquis (Apixaban) can be held for 3 days prior to surgery.  Please resume post op when felt to be safe.    CAD (coronary artery disease) History of non-STEMI in 2019 treated with a drug-eluting stent to the LCx.  He is doing well without anginal symptoms.  He remains very active without CV limitations.  He is not on aspirin as he is on Apixaban.  Continue atorvastatin 80 mg daily, metoprolol succinate 50 mg daily.  Follow-up with Dr. Domenic Polite in 6 months.  Supraventricular tachycardia Overall quiescent.  Continue diltiazem 180 mg daily, metoprolol succinate 50 mg daily.  PAF (paroxysmal atrial fibrillation) (HCC) Maintaining sinus rhythm.  He is tolerating anticoagulation with Apixaban.  Labs from [place] personally reviewed and interpreted: 04/08/2021-Hgb 13.2, creatinine 1.03.  Continue apixaban 5 mg twice daily, diltiazem 180 mg daily, metoprolol succinate 50 mg daily.  As noted, he will hold his apixaban for 3 days prior to surgery.  HTN (hypertension) The patient's blood pressure is controlled on his current regimen.  Continue current therapy.            Dispo:  Return in about 6 months (around 10/17/2021) for Routine follow up in 6 months with Dr. Domenic Polite..    Medication Adjustments/Labs and Tests Ordered: Current medicines are reviewed at length with the patient today.  Concerns regarding medicines are outlined above.  Tests Ordered: Orders Placed This Encounter  Procedures   EKG 12-Lead   Medication Changes: No orders of the defined types were placed in this encounter.  Signed, Richardson Dopp, PA-C  04/19/2021 2:45 PM    [facility] [address], [place], [place]  [address] Phone: [phone]; Fax: [phone]

## 2021-04-19 NOTE — Assessment & Plan Note (Signed)
The patient's blood pressure is controlled on his current regimen.  Continue current therapy.  

## 2021-04-19 NOTE — Assessment & Plan Note (Signed)
History of non-STEMI in 2019 treated with a drug-eluting stent to the LCx.  He is doing well without anginal symptoms.  He remains very active without CV limitations.  He is not on aspirin as he is on Apixaban.  Continue atorvastatin 80 mg daily, metoprolol succinate 50 mg daily.  Follow-up with Dr. Domenic Polite in 6 months.

## 2021-04-19 NOTE — Telephone Encounter (Signed)
Notes faxed to surgeon. Richardson Dopp, PA-C  04/19/2021 5:02 PM

## 2021-04-19 NOTE — Assessment & Plan Note (Signed)
Overall quiescent.  Continue diltiazem 180 mg daily, metoprolol succinate 50 mg daily.

## 2021-05-03 NOTE — Progress Notes (Addendum)
COVID swab appointment: 05/18/21 @ 1200  COVID Vaccine Completed: yes x3 Date COVID Vaccine completed: Has received booster: COVID vaccine manufacturer: [employer]   Date of COVID positive in last 90 days: no  PCP -  Gar Ponto, MD Cardiologist - Rozann Lesches, MD  Cardiac clearance by Richardson Dopp 04/19/21 in Epic  Chest x-ray - CT 03/19/21 Epic EKG - 04/19/21 Epic Stress Test - 2015 Epic ECHO - 03/05/19 Epic Cardiac Cath - 01/01/18 epic  Pacemaker/ICD device last checked: n/a Spinal Cord Stimulator: n/a  Bowel Prep - received instructions and picked up already  Sleep Study - n/a CPAP -   Fasting Blood Sugar - 130-150s Checks Blood Sugar every few days  Blood Thinner Instructions: Eliquis, hold 3 days Aspirin Instructions: Last Dose:  Activity level: Can go up a flight of stairs and perform activities of daily living without stopping and without symptoms of chest pain or shortness of breath.       Anesthesia review: CAD, a fib, SVT, anemia, DM 2, HTN, NSTEMI  Patient denies shortness of breath, fever, cough and chest pain at PAT appointment   Patient verbalized understanding of instructions that were given to them at the PAT appointment. Patient was also instructed that they will need to review over the PAT instructions again at home before surgery.

## 2021-05-03 NOTE — Patient Instructions (Addendum)
DUE TO COVID-19 ONLY ONE VISITOR IS ALLOWED TO COME WITH YOU AND STAY IN THE WAITING ROOM ONLY DURING PRE OP AND PROCEDURE.   **NO VISITORS ARE ALLOWED IN THE SHORT STAY AREA OR RECOVERY ROOM!!**  IF YOU WILL BE ADMITTED INTO THE HOSPITAL YOU ARE ALLOWED ONLY TWO SUPPORT PEOPLE DURING VISITATION HOURS ONLY (7 AM -8PM)   The support person(s) must pass our screening, gel in and out, and wear a mask at all times, including in the patients room. Patients must also wear a mask when staff or their support person are in the room. Visitors GUEST BADGE MUST BE WORN VISIBLY  One adult visitor may remain with you overnight and MUST be in the room by 8 P.M.  No visitors under the age of 14. Any visitor under the age of 105 must be accompanied by an adult.    COVID SWAB TESTING MUST BE COMPLETED ON:  05/18/21 @ 12:00 PM   Site: [facility] [place] Enter: Main Entrance have a seat in the waiting area to the right of main entrance (DO NOT [place]!!!!!) Dial: [phone] to alert staff you have arrived  You are not required to quarantine, however you are required to wear a well-fitted mask when you are out and around people not in your household.  Hand Hygiene often Do NOT share personal items Notify your provider if you are in close contact with someone who has COVID or you develop fever 100.4 or greater, new onset of sneezing, cough, sore throat, shortness of breath or body aches.       Your procedure is scheduled on: 05/20/21   Report to [facility] Main Entrance    Report to admitting at 10:45 AM   Call this number if you have problems the morning of surgery [phone]   Do not eat food :After Midnight.   May have liquids until 10:00 AM day of surgery  CLEAR LIQUID DIET  Foods Allowed                                                                     Foods Excluded  Water, Black Coffee and tea, regular and decaf                              liquids that you cannot  Plain Jell-O in any flavor  (No red)                                           see through such as: Fruit ices (not with fruit pulp)                                     milk, soups, orange juice              Iced Popsicles (No red)  All solid food                                   Apple juices Sports drinks like Gatorade (No red) Lightly seasoned clear broth or consume(fat free) Sugar  Sample Menu Breakfast                                Lunch                                     Supper Cranberry juice                    Beef broth                            Chicken broth Jell-O                                     Grape juice                           Apple juice Coffee or tea                        Jell-O                                      Popsicle                                                Coffee or tea                        Coffee or tea       Drink 2 G2 drinks the night before surgery, have done by 10pm.  Complete one G2 drink the morning of surgery 3 hours prior to scheduled surgery, have done by 10am.     The day of surgery:  Drink ONE (1) Pre-Surgery G2 at 10:00 AM the morning of surgery. Drink in one sitting. Do not sip.  This drink was given to you during your hospital  pre-op appointment visit. Nothing else to drink after completing the  Pre-Surgery G2.          If you have questions, please contact your surgeons office.  FOLLOW BOWEL PREP AND ANY ADDITIONAL PRE OP INSTRUCTIONS YOU RECEIVED FROM YOUR SURGEON'S OFFICE!!!     Oral Hygiene is also important to reduce your risk of infection.                                    Remember - BRUSH YOUR TEETH THE MORNING OF SURGERY WITH YOUR REGULAR TOOTHPASTE   Stop all vitamins and supplements 7 days before surgery.   Follow instructions given to you regarding Eliquis before surgery   Take these medicines the  morning of surgery with A SIP OF WATER:  Allopurinol, Diltiazem, Hydralazine, Metoprolol, Protonix  DO NOT TAKE ANY ORAL DIABETIC MEDICATIONS DAY OF YOUR SURGERY  How to Manage Your Diabetes Before and After Surgery  Why is it important to control my blood sugar before and after surgery? Improving blood sugar levels before and after surgery helps healing and can limit problems. A way of improving blood sugar control is eating a healthy diet by:  Eating less sugar and carbohydrates  Increasing activity/exercise  Talking with your doctor about reaching your blood sugar goals High blood sugars (greater than 180 mg/dL) can raise your risk of infections and slow your recovery, so you will need to focus on controlling your diabetes during the weeks before surgery. Make sure that the doctor who takes care of your diabetes knows about your planned surgery including the date and location.  How do I manage my blood sugar before surgery? Check your blood sugar at least 4 times a day, starting 2 days before surgery, to make sure that the level is not too high or low. Check your blood sugar the morning of your surgery when you wake up and every 2 hours until you get to the Short Stay unit. If your blood sugar is less than 70 mg/dL, you will need to treat for low blood sugar: Do not take insulin. Treat a low blood sugar (less than 70 mg/dL) with  cup of clear juice (cranberry or apple), 4 glucose tablets, OR glucose gel. Recheck blood sugar in 15 minutes after treatment (to make sure it is greater than 70 mg/dL). If your blood sugar is not greater than 70 mg/dL on recheck, call [phone] for further instructions. Report your blood sugar to the short stay nurse when you get to Short Stay.  If you are admitted to the hospital after surgery: Your blood sugar will be checked by the staff and you will probably be given insulin after surgery (instead of oral diabetes medicines) to make sure you have good blood sugar levels. The goal for blood  sugar control after surgery is 80-180 mg/dL.   WHAT DO I DO ABOUT MY DIABETES MEDICATION?  Do not take oral diabetes medicines (pills) the morning of surgery.  THE DAY BEFORE SURGERY, take Metformin as prescribed        THE MORNING OF SURGERY, do not take Metformin   Reviewed and Endorsed by [facility] Patient Education Committee, August 2015                               You may not have any metal on your body including jewelry, and body piercing             Do not wear lotions, powders, cologne, or deodorant               Men may shave face and neck.   Do not bring valuables to the hospital. [place].   Contacts, dentures or bridgework may not be worn into surgery.   Bring small overnight bag day of surgery.   Special Instructions: Bring a copy of your healthcare power of attorney and living will documents         the day of surgery if you haven't scanned them before.  Please read over the following fact sheets you were given: IF YOU HAVE QUESTIONS ABOUT YOUR PRE-OP INSTRUCTIONS PLEASE CALL [place] - Preparing for Surgery Before surgery, you can play an important role.  Because skin is not sterile, your skin needs to be as free of germs as possible.  You can reduce the number of germs on your skin by washing with CHG (chlorahexidine gluconate) soap before surgery.  CHG is an antiseptic cleaner which kills germs and bonds with the skin to continue killing germs even after washing. Please DO NOT use if you have an allergy to CHG or antibacterial soaps.  If your skin becomes reddened/irritated stop using the CHG and inform your nurse when you arrive at Short Stay. Do not shave (including legs and underarms) for at least 48 hours prior to the first CHG shower.  You may shave your face/neck.  Please follow these instructions carefully:  1.  Shower with CHG Soap the night before surgery and  the  morning of surgery.  2.  If you choose to wash your hair, wash your hair first as usual with your normal  shampoo.  3.  After you shampoo, rinse your hair and body thoroughly to remove the shampoo.                             4.  Use CHG as you would any other liquid soap.  You can apply chg directly to the skin and wash.  Gently with a scrungie or clean washcloth.  5.  Apply the CHG Soap to your body ONLY FROM THE NECK DOWN.   Do   not use on face/ open                           Wound or open sores. Avoid contact with eyes, ears mouth and   genitals (private parts).                       Wash face,  Genitals (private parts) with your normal soap.             6.  Wash thoroughly, paying special attention to the area where your    surgery  will be performed.  7.  Thoroughly rinse your body with warm water from the neck down.  8.  DO NOT shower/wash with your normal soap after using and rinsing off the CHG Soap.                9.  Pat yourself dry with a clean towel.            10.  Wear clean pajamas.            11.  Place clean sheets on your bed the night of your first shower and do not  sleep with pets. Day of Surgery : Do not apply any lotions/deodorants the morning of surgery.  Please wear clean clothes to the hospital/surgery center.  FAILURE TO FOLLOW THESE INSTRUCTIONS MAY RESULT IN THE CANCELLATION OF YOUR SURGERY  PATIENT SIGNATURE_________________________________  NURSE SIGNATURE__________________________________  ________________________________________________________________________   [name] [name]  An incentive spirometer is a tool that can help keep your lungs clear and active. This tool measures how well you are filling your lungs with each breath. Taking long deep breaths may help reverse or decrease the chance of developing  breathing (pulmonary) problems (especially infection) following: A long period of time when you are unable to move or be active. BEFORE  THE PROCEDURE  If the spirometer includes an indicator to show your best effort, your nurse or respiratory therapist will set it to a desired goal. If possible, sit up straight or lean slightly forward. Try not to slouch. Hold the incentive spirometer in an upright position. INSTRUCTIONS FOR USE  Sit on the edge of your bed if possible, or sit up as far as you can in bed or on a chair. Hold the incentive spirometer in an upright position. Breathe out normally. Place the mouthpiece in your mouth and seal your lips tightly around it. Breathe in slowly and as deeply as possible, raising the piston or the ball toward the top of the column. Hold your breath for 3-5 seconds or for as long as possible. Allow the piston or ball to fall to the bottom of the column. Remove the mouthpiece from your mouth and breathe out normally. Rest for a few seconds and repeat Steps 1 through 7 at least 10 times every 1-2 hours when you are awake. Take your time and take a few normal breaths between deep breaths. The spirometer may include an indicator to show your best effort. Use the indicator as a goal to work toward during each repetition. After each set of 10 deep breaths, practice coughing to be sure your lungs are clear. If you have an incision (the cut made at the time of surgery), support your incision when coughing by placing a pillow or rolled up towels firmly against it. Once you are able to get out of bed, walk around indoors and cough well. You may stop using the incentive spirometer when instructed by your caregiver.  RISKS AND COMPLICATIONS Take your time so you do not get dizzy or light-headed. If you are in pain, you may need to take or ask for pain medication before doing incentive spirometry. It is harder to take a deep breath if you are having pain. AFTER USE Rest and breathe slowly and easily. It can be helpful to keep track of a log of your progress. Your caregiver can provide you with a simple  table to help with this. If you are using the spirometer at home, follow these instructions: [place] IF:  You are having difficultly using the spirometer. You have trouble using the spirometer as often as instructed. Your pain medication is not giving enough relief while using the spirometer. You develop fever of 100.5 F (38.1 C) or higher. SEEK IMMEDIATE MEDICAL CARE IF:  You cough up bloody sputum that had not been present before. You develop fever of 102 F (38.9 C) or greater. You develop worsening pain at or near the incision site. MAKE SURE YOU:  Understand these instructions. Will watch your condition. Will get help right away if you are not doing well or get worse. Document Released: 07/24/2006 Document Revised: 06/05/2011 Document Reviewed: 09/24/2006 Exit[employer] Patient Information 2014 [employer], [place].   ________________________________________________________________________  WHAT IS A BLOOD TRANSFUSION? Blood Transfusion Information  A transfusion is the replacement of blood or some of its parts. Blood is made up of multiple cells which provide different functions. Red blood cells carry oxygen and are used for blood loss replacement. White blood cells fight against infection. Platelets control bleeding. Plasma helps clot blood. Other blood products are available for specialized needs, such as hemophilia or other clotting disorders. BEFORE THE TRANSFUSION  Who gives  blood for transfusions?  Healthy volunteers who are fully evaluated to make sure their blood is safe. This is blood bank blood. Transfusion therapy is the safest it has ever been in the practice of medicine. Before blood is taken from a donor, a complete history is taken to make sure that person has no history of diseases nor engages in risky social behavior (examples are intravenous drug use or sexual activity with multiple partners). The donor's travel history is screened to minimize risk of  transmitting infections, such as malaria. The donated blood is tested for signs of infectious diseases, such as HIV and hepatitis. The blood is then tested to be sure it is compatible with you in order to minimize the chance of a transfusion reaction. If you or a relative donates blood, this is often done in anticipation of surgery and is not appropriate for emergency situations. It takes many days to process the donated blood. RISKS AND COMPLICATIONS Although transfusion therapy is very safe and saves many lives, the main dangers of transfusion include:  Getting an infectious disease. Developing a transfusion reaction. This is an allergic reaction to something in the blood you were given. Every precaution is taken to prevent this. The decision to have a blood transfusion has been considered carefully by your caregiver before blood is given. Blood is not given unless the benefits outweigh the risks. AFTER THE TRANSFUSION Right after receiving a blood transfusion, you will usually feel much better and more energetic. This is especially true if your red blood cells have gotten low (anemic). The transfusion raises the level of the red blood cells which carry oxygen, and this usually causes an energy increase. The nurse administering the transfusion will monitor you carefully for complications. HOME CARE INSTRUCTIONS  No special instructions are needed after a transfusion. You may find your energy is better. Speak with your caregiver about any limitations on activity for underlying diseases you may have. SEEK MEDICAL CARE IF:  Your condition is not improving after your transfusion. You develop redness or irritation at the intravenous (IV) site. SEEK IMMEDIATE MEDICAL CARE IF:  Any of the following symptoms occur over the next 12 hours: Shaking chills. You have a temperature by mouth above 102 F (38.9 C), not controlled by medicine. Chest, back, or muscle pain. People around you feel you are not  acting correctly or are confused. Shortness of breath or difficulty breathing. Dizziness and fainting. You get a rash or develop hives. You have a decrease in urine output. Your urine turns a dark color or changes to pink, red, or brown. Any of the following symptoms occur over the next 10 days: You have a temperature by mouth above 102 F (38.9 C), not controlled by medicine. Shortness of breath. Weakness after normal activity. The white part of the eye turns yellow (jaundice). You have a decrease in the amount of urine or are urinating less often. Your urine turns a dark color or changes to pink, red, or brown. Document Released: 03/10/2000 Document Revised: 06/05/2011 Document Reviewed: 10/28/2007 [facility] Patient Information 2014 [place], [place].  _______________________________________________________________________

## 2021-05-05 ENCOUNTER — Other Ambulatory Visit: Payer: Self-pay

## 2021-05-05 ENCOUNTER — Encounter (HOSPITAL_COMMUNITY): Payer: Self-pay

## 2021-05-05 ENCOUNTER — Encounter (HOSPITAL_COMMUNITY)
Admission: RE | Admit: 2021-05-05 | Discharge: 2021-05-05 | Disposition: A | Discharge status: Home / Self Care | Payer: Medicare HMO | Source: Ambulatory Visit | Attending: General Surgery | Admitting: General Surgery

## 2021-05-05 VITALS — BP 141/61 | HR 54 | Temp 97.5°F | Resp 18 | Ht 71.0 in | Wt 222.0 lb

## 2021-05-05 DIAGNOSIS — I2510 Atherosclerotic heart disease of native coronary artery without angina pectoris: Secondary | ICD-10-CM | POA: Diagnosis not present

## 2021-05-05 DIAGNOSIS — E119 Type 2 diabetes mellitus without complications: Secondary | ICD-10-CM | POA: Diagnosis not present

## 2021-05-05 DIAGNOSIS — I480 Paroxysmal atrial fibrillation: Secondary | ICD-10-CM | POA: Insufficient documentation

## 2021-05-05 DIAGNOSIS — Z01818 Encounter for other preprocedural examination: Secondary | ICD-10-CM

## 2021-05-05 DIAGNOSIS — I10 Essential (primary) hypertension: Secondary | ICD-10-CM | POA: Diagnosis not present

## 2021-05-05 DIAGNOSIS — Z87891 Personal history of nicotine dependence: Secondary | ICD-10-CM | POA: Insufficient documentation

## 2021-05-05 DIAGNOSIS — Z955 Presence of coronary angioplasty implant and graft: Secondary | ICD-10-CM | POA: Insufficient documentation

## 2021-05-05 DIAGNOSIS — Z8546 Personal history of malignant neoplasm of prostate: Secondary | ICD-10-CM | POA: Insufficient documentation

## 2021-05-05 DIAGNOSIS — Z01812 Encounter for preprocedural laboratory examination: Secondary | ICD-10-CM | POA: Diagnosis present

## 2021-05-05 DIAGNOSIS — K219 Gastro-esophageal reflux disease without esophagitis: Secondary | ICD-10-CM | POA: Diagnosis not present

## 2021-05-05 DIAGNOSIS — Z7901 Long term (current) use of anticoagulants: Secondary | ICD-10-CM | POA: Diagnosis not present

## 2021-05-05 DIAGNOSIS — C189 Malignant neoplasm of colon, unspecified: Secondary | ICD-10-CM | POA: Insufficient documentation

## 2021-05-05 HISTORY — DX: Type 2 diabetes mellitus without complications: E11.9

## 2021-05-05 LAB — BASIC METABOLIC PANEL
Anion gap: 7 (ref 5–15)
BUN: 18 mg/dL (ref 8–23)
CO2: 26 mmol/L (ref 22–32)
Calcium: 9.4 mg/dL (ref 8.9–10.3)
Chloride: 105 mmol/L (ref 98–111)
Creatinine, Ser: 0.67 mg/dL (ref 0.61–1.24)
GFR, Estimated: >60 mL/min (ref >60)
Glucose, Bld: 120 mg/dL — ABNORMAL HIGH (ref 70–99)
Potassium: 3.8 mmol/L (ref 3.5–5.1)
Sodium: 138 mmol/L (ref 135–145)

## 2021-05-05 LAB — CBC
HCT: 38.9 % — ABNORMAL LOW (ref 39.0–52.0)
Hemoglobin: 12.8 g/dL — ABNORMAL LOW (ref 13.0–17.0)
MCH: 31.4 pg (ref 26.0–34.0)
MCHC: 32.9 g/dL (ref 30.0–36.0)
MCV: 95.6 fL (ref 80.0–100.0)
Platelets: 172 10*3/uL (ref 150–400)
RBC: 4.07 MIL/uL — ABNORMAL LOW (ref 4.22–5.81)
RDW: 14.5 % (ref 11.5–15.5)
WBC: 5.9 10*3/uL (ref 4.0–10.5)
nRBC: 0 % (ref 0.0–0.2)

## 2021-05-05 LAB — HEMOGLOBIN A1C
Hgb A1c MFr Bld: 6.5 % — ABNORMAL HIGH (ref 4.8–5.6)
Mean Plasma Glucose: 139.85 mg/dL

## 2021-05-05 LAB — GLUCOSE, CAPILLARY: Glucose-Capillary: 128 mg/dL — ABNORMAL HIGH (ref 70–99)

## 2021-05-09 NOTE — Anesthesia Preprocedure Evaluation (Addendum)
Anesthesia Evaluation  Patient identified by MRN, date of birth, ID band Patient awake    Reviewed: Allergy & Precautions, NPO status , Patient's Chart, lab work & pertinent test results, reviewed documented beta blocker date and time   History of Anesthesia Complications Negative for: history of anesthetic complications  Airway Mallampati: II  TM Distance: >3 FB Neck ROM: Full    Dental  (+) Partial Lower, Dental Advisory Given   Pulmonary neg pulmonary ROS, former smoker,    Pulmonary exam normal        Cardiovascular hypertension, Pt. on medications and Pt. on home beta blockers + CAD, + Past MI and + Cardiac Stents (2019)  Normal cardiovascular exam+ dysrhythmias Atrial Fibrillation   H/o pericardial effusion s/p pericardial window  Echo 2020: EF 70-75%, mod-severe LVH, g1dd, nl RV size/fn, severe LAE, small circumferential pericardial effusion, restricted motion of L coronary cusp with no definite AS, mild aortic root dilatation   Neuro/Psych negative neurological ROS     GI/Hepatic Neg liver ROS, GERD  ,Colon cancer   Endo/Other  diabetes, Type 2, Oral Hypoglycemic Agents  Renal/GU negative Renal ROS   H/o prostate cancer    Musculoskeletal  (+) Arthritis ,   Abdominal   Peds  Hematology  (+) Blood dyscrasia, anemia , Eliquis   Anesthesia Other Findings   Reproductive/Obstetrics                           Anesthesia Physical Anesthesia Plan  ASA: 3  Anesthesia Plan: General   Post-op Pain Management: Tylenol PO (pre-op), Gabapentin PO (pre-op) and Lidocaine infusion   Induction: Intravenous  PONV Risk Score and Plan: 2 and Ondansetron, Dexamethasone, Treatment may vary due to age or medical condition and Midazolam  Airway Management Planned: Oral ETT  Additional Equipment: None  Intra-op Plan:   Post-operative Plan: Extubation in OR  Informed Consent: I have reviewed  the patients History and Physical, chart, labs and discussed the procedure including the risks, benefits and alternatives for the proposed anesthesia with the patient or authorized representative who has indicated his/her understanding and acceptance.     Dental advisory given  Plan Discussed with:   Anesthesia Plan Comments: (See PAT note 05/05/2021, Konrad Felix Ward, PA-C)       Anesthesia Quick Evaluation

## 2021-05-09 NOTE — Progress Notes (Signed)
Anesthesia Chart Review   Case: [id number] Date/Time: 05/20/21 1245   Procedure: XI ROBOT ASSISTED LAPAROSCOPIC PARTIAL COLECTOMY   Anesthesia type: General   Pre-op diagnosis: colon cancer   Location: [facility]OM 02 / [facility]   Surgeons: Leighton Ruff, MD       [id number]:81 y.o. former smoker with h/o HTN, GERD, CAD (DES 12/2017), PAF, DM II, prostate cancer, colon cancer scheduled for above procedure 04/20/5807 with Dr. Leighton Ruff.    Pt seen by cardiology 04/19/2021. Per OV note, "Preoperative cardiovascular examination [name]'s perioperative risk of a major cardiac event is 6.6% according to the Revised Cardiac Risk Index (RCRI).  Therefore, he is at high risk for perioperative complications.   His functional capacity is good at 4.4 METs according to the Duke Activity Status Index (DASI). Recommendations: According to ACC/AHA guidelines, no further cardiovascular testing needed.  The patient may proceed to surgery at acceptable risk.   Antiplatelet and/or Anticoagulation Recommendations: Eliquis (Apixaban) can be held for 3 days prior to surgery.  Please resume post op when felt to be safe."  Anticipate pt can proceed with planned procedure barring acute status change.   VS: BP (!) 141/61    Pulse (!) 54    Temp (!) 36.4 C (Oral)    Resp 18    Ht 5\' 11"  (1.803 m)    Wt 100.7 kg    SpO2 95%    BMI 30.96 kg/m   PROVIDERS: Caryl Bis, MD is PCP   Cardiologist - Rozann Lesches, MD LABS: Labs reviewed: Acceptable for surgery. (all labs ordered are listed, but only abnormal results are displayed)  Labs Reviewed  HEMOGLOBIN A1C - Abnormal; Notable for the following components:      Result Value   Hgb A1c MFr Bld 6.5 (*)    All other components within normal limits  BASIC METABOLIC PANEL - Abnormal; Notable for the following components:   Glucose, Bld 120 (*)    All other components within normal limits  CBC - Abnormal; Notable for the following components:   RBC 4.07 (*)     Hemoglobin 12.8 (*)    HCT 38.9 (*)    All other components within normal limits  GLUCOSE, CAPILLARY - Abnormal; Notable for the following components:   Glucose-Capillary 128 (*)    All other components within normal limits     IMAGES:   EKG: 04/19/2021 Rate 53 bpm    CV: Echo 03/05/2019 1. Left ventricular ejection fraction, by visual estimation, is 70 to  75%. The left ventricle has hyperdynamic function. There is moderately to  severely increased left ventricular hypertrophy.   2. Left ventricular diastolic parameters are consistent with Grade I  diastolic dysfunction (impaired relaxation).   3. Global right ventricle has normal systolic function.The right  ventricular size is normal. No increase in right ventricular wall  thickness.   4. Left atrial size was severely dilated.   5. Right atrial size was normal.   6. Small pericardial effusion.   7. The pericardial effusion is circumferential.   8. Mild to moderate mitral annular calcification.   9. The mitral valve is degenerative. Trivial mitral valve regurgitation.  10. The tricuspid valve is grossly normal. Tricuspid valve regurgitation  is trivial.  11. The aortic valve is tricuspid. Aortic valve regurgitation is not  visualized. There is restricted motion of the left coronary cusp and  associated moderate, nodular calcification in the LVOT. No definite aortic  stenosis.  12. The pulmonic valve  was grossly normal. Pulmonic valve regurgitation is  trivial.  13. Aortic dilatation noted.  14. There is mild dilatation of the aortic root.  15. TR signal is inadequate for assessing pulmonary artery systolic  pressure.  16. The inferior vena cava is normal in size with greater than 50%  respiratory variability, suggesting right atrial pressure of 3 mmHg.  17. Aortic valve mean gradient measures 8.0 mmHg.  18. The left ventricle has no regional wall motion abnormalities.   Cardiac Cath 01/01/2018 Mid Cx lesion is  95% stenosed. Prox LAD lesion is 25% stenosed. Mid LAD lesion is 30% stenosed. Prox RCA to Mid RCA lesion is 50% stenosed. The left ventricular systolic function is normal. LV end diastolic pressure is normal. LVEDP 12 mm Hg. The left ventricular ejection fraction is 50-55% by visual estimate. There is no aortic valve stenosis. Severe tortuosity in the right subclavian which makes torquing catheters difficult. Would consider femoral approach in the future, particularly if emergent cardiac cath was needed. A drug-eluting stent was successfully placed using a STENT SIERRA 3.25 X 15 MM. Post intervention, there is a 0% residual stenosis. Past Medical History:  Diagnosis Date   Anemia    Arthritis    CAD (coronary artery disease)    DES to circumflex 12/2017   Diabetes mellitus without complication (HCC)    GERD (gastroesophageal reflux disease)    Glaucoma    Gout    Headache    History of kidney stones    Hypertension    NSTEMI (non-ST elevated myocardial infarction) ([place]) 12/31/2017   Paroxysmal atrial fibrillation (HCC)    Pericardial effusion    Idiopathic, recurrent pericardial effusion s/p pericardial window.   Prostate cancer ([place]) 2007   S/P Seed implants    Supraventricular tachycardia ([place])    Temporal arteritis ([place])     Past Surgical History:  Procedure Laterality Date   BIOPSY  10/12/2020   Procedure: BIOPSY;  Surgeon: [name], MD;  [facility];  Service: Gastroenterology;;   BIOPSY  02/25/2021   Procedure: BIOPSY;  Surgeon: [name], MD;  [facility];  Service: Gastroenterology;;   BIOPSY  04/11/2021   Procedure: BIOPSY;  Surgeon: Irving Copas., MD;  Location: [facility];  Service: Gastroenterology;;   CATARACT EXTRACTION W/PHACO Left 06/07/2015   Procedure: CATARACT EXTRACTION PHACO AND INTRAOCULAR LENS PLACEMENT LEFT EYE CDE=8.00;  Surgeon: Tonny Branch, MD;  [facility];  Service:  Ophthalmology;  Laterality: Left;   CATARACT EXTRACTION W/PHACO Right 06/17/2015   Procedure: CATARACT EXTRACTION PHACO AND INTRAOCULAR LENS PLACEMENT RIGHT EYE CDE=11.09;  Surgeon: Tonny Branch, MD;  [facility];  Service: Ophthalmology;  Laterality: Right;   COLONOSCOPY WITH PROPOFOL N/A 10/12/2020   Procedure: COLONOSCOPY WITH PROPOFOL;  Surgeon: [name], MD;  [facility];  Service: Gastroenterology;  Laterality: N/A;  10:35   COLONOSCOPY WITH PROPOFOL N/A 02/25/2021   Procedure: COLONOSCOPY WITH PROPOFOL;  Surgeon: [name], MD;  [facility];  Service: Gastroenterology;  Laterality: N/A;  8:10   CORONARY ANGIOPLASTY WITH STENT PLACEMENT  01/01/2018   CORONARY STENT INTERVENTION N/A 01/01/2018   Procedure: CORONARY STENT INTERVENTION;  Surgeon: Jettie Booze, MD;  Location: [facility];  Service: Cardiovascular;  Laterality: N/A;   ENDOSCOPIC MUCOSAL RESECTION N/A 04/11/2021   Procedure: ENDOSCOPIC MUCOSAL RESECTION;  Surgeon: Rush Landmark Telford Nab., MD;  [facility];  Service: Gastroenterology;  Laterality: N/A;   ENTEROSCOPY N/A 10/12/2020  Procedure: PUSH ENTEROSCOPY;  Surgeon: [name], MD;  [facility];  Service: Gastroenterology;  Laterality: N/A;   ENTEROSCOPY N/A 04/11/2021   Procedure: ENTEROSCOPY;  Surgeon: Rush Landmark Telford Nab., MD;  Location: [facility];  Service: Gastroenterology;  Laterality: N/A;   ESOPHAGOGASTRODUODENOSCOPY (EGD) WITH PROPOFOL N/A 10/12/2020   Procedure: ESOPHAGOGASTRODUODENOSCOPY (EGD) WITH PROPOFOL;  Surgeon: [name], MD;  [facility];  Service: Gastroenterology;  Laterality: N/A;   FRACTURE SURGERY Left 2010   ankle   HEMOSTASIS CLIP PLACEMENT  04/11/2021   Procedure: HEMOSTASIS CLIP PLACEMENT;  Surgeon: Irving Copas., MD;  [facility];  Service: Gastroenterology;;   INSERTION PROSTATE  RADIATION SEED  2007   KNEE ARTHROSCOPY Left    LEFT HEART CATH AND CORONARY ANGIOGRAPHY N/A 01/01/2018   Procedure: LEFT HEART CATH AND CORONARY ANGIOGRAPHY;  Surgeon: Jettie Booze, MD;  Location: [facility];  Service: Cardiovascular;  Laterality: N/A;   ORIF TIBIA & FIBULA FRACTURES Left 2003   Distal tibial/fibula    PERICARDIAL WINDOW  02/2007   PERICARDIOCENTESIS  2007   hx/notes 10/19/2011   POLYPECTOMY  10/12/2020   Procedure: POLYPECTOMY;  Surgeon: Montez Morita, [name], MD;  [facility];  Service: Gastroenterology;;  small bowel, cecal   POLYPECTOMY  02/25/2021   Procedure: POLYPECTOMY;  Surgeon: [name], MD;  [facility];  Service: Gastroenterology;;  transverse colon x2   SUBMUCOSAL LIFTING INJECTION  04/11/2021   Procedure: SUBMUCOSAL LIFTING INJECTION;  Surgeon: Irving Copas., MD;  Location: [facility];  Service: Gastroenterology;;   [place]ia [place] TATTOO INJECTION  04/11/2021   Procedure: SUBMUCOSAL TATTOO INJECTION;  Surgeon: Irving Copas., MD;  [facility];  Service: Gastroenterology;;    MEDICATIONS:  allopurinol (ZYLOPRIM) 300 MG tablet   apixaban (ELIQUIS) 5 MG TABS tablet   atorvastatin (LIPITOR) 80 MG tablet   benazepril (LOTENSIN) 40 MG tablet   diltiazem (CARDIZEM CD) 180 MG 24 hr capsule   dorzolamide (TRUSOPT) 2 % ophthalmic solution   FERREX 150 150 MG capsule   hydrALAZINE (APRESOLINE) 100 MG tablet   hydrochlorothiazide (HYDRODIURIL) 12.5 MG tablet   latanoprost (XALATAN) 0.005 % ophthalmic solution   metFORMIN (GLUCOPHAGE-XR) 500 MG 24 hr tablet   metoprolol succinate (TOPROL-XL) 50 MG 24 hr tablet   Multiple Vitamin (MULTIVITAMIN) tablet   pantoprazole (PROTONIX) 40 MG tablet   vitamin C (ASCORBIC ACID) 500 MG tablet   No current facility-administered medications for this encounter.    [name] Felix Ward, PA-C [facility] [phone]

## 2021-05-18 ENCOUNTER — Other Ambulatory Visit: Payer: Self-pay

## 2021-05-18 ENCOUNTER — Encounter (HOSPITAL_COMMUNITY)
Admission: RE | Admit: 2021-05-18 | Discharge: 2021-05-18 | Disposition: A | Discharge status: Home / Self Care | Payer: Medicare HMO | Source: Ambulatory Visit | Attending: General Surgery | Admitting: General Surgery

## 2021-05-18 DIAGNOSIS — Z01812 Encounter for preprocedural laboratory examination: Secondary | ICD-10-CM | POA: Insufficient documentation

## 2021-05-18 DIAGNOSIS — Z01818 Encounter for other preprocedural examination: Secondary | ICD-10-CM

## 2021-05-18 DIAGNOSIS — Z20822 Contact with and (suspected) exposure to covid-19: Secondary | ICD-10-CM | POA: Insufficient documentation

## 2021-05-18 LAB — SARS CORONAVIRUS 2 (TAT 6-24 HRS): SARS Coronavirus 2: NEGATIVE

## 2021-05-20 ENCOUNTER — Inpatient Hospital Stay (HOSPITAL_COMMUNITY)
Admission: RE | Admit: 2021-05-20 | Discharge: 2021-05-30 | DRG: 329 | Disposition: A | Discharge status: Home Health | Payer: Medicare HMO | Source: Ambulatory Visit | Attending: General Surgery | Admitting: General Surgery

## 2021-05-20 ENCOUNTER — Encounter (HOSPITAL_COMMUNITY)
Admission: RE | Disposition: A | Discharge status: Home / Self Care | Payer: Self-pay | Source: Ambulatory Visit | Attending: General Surgery

## 2021-05-20 ENCOUNTER — Other Ambulatory Visit: Payer: Self-pay

## 2021-05-20 ENCOUNTER — Inpatient Hospital Stay (HOSPITAL_COMMUNITY): Payer: Medicare HMO | Admitting: Anesthesiology

## 2021-05-20 ENCOUNTER — Inpatient Hospital Stay (HOSPITAL_COMMUNITY): Payer: Medicare HMO | Admitting: Physician Assistant

## 2021-05-20 ENCOUNTER — Encounter (HOSPITAL_COMMUNITY): Payer: Self-pay | Admitting: General Surgery

## 2021-05-20 ENCOUNTER — Inpatient Hospital Stay (HOSPITAL_COMMUNITY): Payer: Medicare HMO

## 2021-05-20 DIAGNOSIS — K661 Hemoperitoneum: Secondary | ICD-10-CM | POA: Diagnosis not present

## 2021-05-20 DIAGNOSIS — T8111XA Postprocedural  cardiogenic shock, initial encounter: Secondary | ICD-10-CM | POA: Diagnosis not present

## 2021-05-20 DIAGNOSIS — E872 Acidosis, unspecified: Secondary | ICD-10-CM | POA: Diagnosis not present

## 2021-05-20 DIAGNOSIS — M1990 Unspecified osteoarthritis, unspecified site: Secondary | ICD-10-CM | POA: Diagnosis present

## 2021-05-20 DIAGNOSIS — D65 Disseminated intravascular coagulation [defibrination syndrome]: Secondary | ICD-10-CM | POA: Diagnosis not present

## 2021-05-20 DIAGNOSIS — I4891 Unspecified atrial fibrillation: Secondary | ICD-10-CM | POA: Diagnosis not present

## 2021-05-20 DIAGNOSIS — I252 Old myocardial infarction: Secondary | ICD-10-CM | POA: Diagnosis not present

## 2021-05-20 DIAGNOSIS — D62 Acute posthemorrhagic anemia: Secondary | ICD-10-CM | POA: Diagnosis not present

## 2021-05-20 DIAGNOSIS — Z923 Personal history of irradiation: Secondary | ICD-10-CM

## 2021-05-20 DIAGNOSIS — Z20822 Contact with and (suspected) exposure to covid-19: Secondary | ICD-10-CM | POA: Diagnosis present

## 2021-05-20 DIAGNOSIS — E785 Hyperlipidemia, unspecified: Secondary | ICD-10-CM | POA: Diagnosis present

## 2021-05-20 DIAGNOSIS — E119 Type 2 diabetes mellitus without complications: Secondary | ICD-10-CM | POA: Diagnosis not present

## 2021-05-20 DIAGNOSIS — E871 Hypo-osmolality and hyponatremia: Secondary | ICD-10-CM | POA: Diagnosis not present

## 2021-05-20 DIAGNOSIS — C184 Malignant neoplasm of transverse colon: Secondary | ICD-10-CM

## 2021-05-20 DIAGNOSIS — Y733 Surgical instruments, materials and gastroenterology and urology devices (including sutures) associated with adverse incidents: Secondary | ICD-10-CM | POA: Diagnosis not present

## 2021-05-20 DIAGNOSIS — I480 Paroxysmal atrial fibrillation: Secondary | ICD-10-CM | POA: Diagnosis present

## 2021-05-20 DIAGNOSIS — Z79899 Other long term (current) drug therapy: Secondary | ICD-10-CM

## 2021-05-20 DIAGNOSIS — Z8249 Family history of ischemic heart disease and other diseases of the circulatory system: Secondary | ICD-10-CM

## 2021-05-20 DIAGNOSIS — H409 Unspecified glaucoma: Secondary | ICD-10-CM | POA: Diagnosis present

## 2021-05-20 DIAGNOSIS — Z01818 Encounter for other preprocedural examination: Secondary | ICD-10-CM | POA: Diagnosis not present

## 2021-05-20 DIAGNOSIS — K219 Gastro-esophageal reflux disease without esophagitis: Secondary | ICD-10-CM | POA: Diagnosis present

## 2021-05-20 DIAGNOSIS — R571 Hypovolemic shock: Secondary | ICD-10-CM | POA: Diagnosis not present

## 2021-05-20 DIAGNOSIS — I10 Essential (primary) hypertension: Secondary | ICD-10-CM | POA: Diagnosis not present

## 2021-05-20 DIAGNOSIS — K567 Ileus, unspecified: Secondary | ICD-10-CM | POA: Diagnosis not present

## 2021-05-20 DIAGNOSIS — R34 Anuria and oliguria: Secondary | ICD-10-CM | POA: Diagnosis not present

## 2021-05-20 DIAGNOSIS — Z452 Encounter for adjustment and management of vascular access device: Secondary | ICD-10-CM

## 2021-05-20 DIAGNOSIS — Z7984 Long term (current) use of oral hypoglycemic drugs: Secondary | ICD-10-CM

## 2021-05-20 DIAGNOSIS — T8119XA Other postprocedural shock, initial encounter: Secondary | ICD-10-CM | POA: Diagnosis not present

## 2021-05-20 DIAGNOSIS — I2510 Atherosclerotic heart disease of native coronary artery without angina pectoris: Secondary | ICD-10-CM | POA: Diagnosis present

## 2021-05-20 DIAGNOSIS — I3139 Other pericardial effusion (noninflammatory): Secondary | ICD-10-CM | POA: Diagnosis not present

## 2021-05-20 DIAGNOSIS — M79604 Pain in right leg: Secondary | ICD-10-CM | POA: Diagnosis not present

## 2021-05-20 DIAGNOSIS — R001 Bradycardia, unspecified: Secondary | ICD-10-CM | POA: Diagnosis not present

## 2021-05-20 DIAGNOSIS — Z801 Family history of malignant neoplasm of trachea, bronchus and lung: Secondary | ICD-10-CM

## 2021-05-20 DIAGNOSIS — I129 Hypertensive chronic kidney disease with stage 1 through stage 4 chronic kidney disease, or unspecified chronic kidney disease: Secondary | ICD-10-CM | POA: Diagnosis not present

## 2021-05-20 DIAGNOSIS — D509 Iron deficiency anemia, unspecified: Secondary | ICD-10-CM | POA: Diagnosis present

## 2021-05-20 DIAGNOSIS — N200 Calculus of kidney: Secondary | ICD-10-CM | POA: Diagnosis not present

## 2021-05-20 DIAGNOSIS — M79605 Pain in left leg: Secondary | ICD-10-CM | POA: Diagnosis not present

## 2021-05-20 DIAGNOSIS — C186 Malignant neoplasm of descending colon: Principal | ICD-10-CM | POA: Diagnosis present

## 2021-05-20 DIAGNOSIS — R578 Other shock: Secondary | ICD-10-CM | POA: Diagnosis not present

## 2021-05-20 DIAGNOSIS — Y838 Other surgical procedures as the cause of abnormal reaction of the patient, or of later complication, without mention of misadventure at the time of the procedure: Secondary | ICD-10-CM | POA: Diagnosis not present

## 2021-05-20 DIAGNOSIS — E1122 Type 2 diabetes mellitus with diabetic chronic kidney disease: Secondary | ICD-10-CM | POA: Diagnosis not present

## 2021-05-20 DIAGNOSIS — C189 Malignant neoplasm of colon, unspecified: Secondary | ICD-10-CM | POA: Diagnosis present

## 2021-05-20 DIAGNOSIS — K91870 Postprocedural hematoma of a digestive system organ or structure following a digestive system procedure: Secondary | ICD-10-CM | POA: Diagnosis not present

## 2021-05-20 DIAGNOSIS — N179 Acute kidney failure, unspecified: Secondary | ICD-10-CM | POA: Diagnosis not present

## 2021-05-20 DIAGNOSIS — Z7901 Long term (current) use of anticoagulants: Secondary | ICD-10-CM

## 2021-05-20 DIAGNOSIS — I9589 Other hypotension: Secondary | ICD-10-CM | POA: Diagnosis not present

## 2021-05-20 DIAGNOSIS — I421 Obstructive hypertrophic cardiomyopathy: Secondary | ICD-10-CM | POA: Diagnosis not present

## 2021-05-20 DIAGNOSIS — Z87891 Personal history of nicotine dependence: Secondary | ICD-10-CM

## 2021-05-20 DIAGNOSIS — D630 Anemia in neoplastic disease: Secondary | ICD-10-CM | POA: Diagnosis not present

## 2021-05-20 DIAGNOSIS — R109 Unspecified abdominal pain: Secondary | ICD-10-CM | POA: Diagnosis not present

## 2021-05-20 DIAGNOSIS — Z888 Allergy status to other drugs, medicaments and biological substances status: Secondary | ICD-10-CM

## 2021-05-20 DIAGNOSIS — C772 Secondary and unspecified malignant neoplasm of intra-abdominal lymph nodes: Secondary | ICD-10-CM | POA: Diagnosis not present

## 2021-05-20 DIAGNOSIS — Z8042 Family history of malignant neoplasm of prostate: Secondary | ICD-10-CM

## 2021-05-20 DIAGNOSIS — Z8546 Personal history of malignant neoplasm of prostate: Secondary | ICD-10-CM

## 2021-05-20 DIAGNOSIS — Z955 Presence of coronary angioplasty implant and graft: Secondary | ICD-10-CM

## 2021-05-20 DIAGNOSIS — I9581 Postprocedural hypotension: Secondary | ICD-10-CM | POA: Diagnosis not present

## 2021-05-20 LAB — POCT I-STAT EG7
Acid-base deficit: 7 mmol/L — ABNORMAL HIGH (ref 0.0–2.0)
Bicarbonate: 19.3 mmol/L — ABNORMAL LOW (ref 20.0–28.0)
Calcium, Ion: 1.21 mmol/L (ref 1.15–1.40)
HCT: 23 % — ABNORMAL LOW (ref 39.0–52.0)
Hemoglobin: 7.8 g/dL — ABNORMAL LOW (ref 13.0–17.0)
O2 Saturation: 76 %
Potassium: 3.7 mmol/L (ref 3.5–5.1)
Sodium: 138 mmol/L (ref 135–145)
TCO2: 20 mmol/L — ABNORMAL LOW (ref 22–32)
pCO2, Ven: 39.1 mmHg — ABNORMAL LOW (ref 44–60)
pH, Ven: 7.301 (ref 7.25–7.43)
pO2, Ven: 45 mmHg (ref 32–45)

## 2021-05-20 LAB — CBC
HCT: 29.1 % — ABNORMAL LOW (ref 39.0–52.0)
Hemoglobin: 9.5 g/dL — ABNORMAL LOW (ref 13.0–17.0)
MCH: 30.1 pg (ref 26.0–34.0)
MCHC: 32.6 g/dL (ref 30.0–36.0)
MCV: 92.1 fL (ref 80.0–100.0)
Platelets: 128 10*3/uL — ABNORMAL LOW (ref 150–400)
RBC: 3.16 MIL/uL — ABNORMAL LOW (ref 4.22–5.81)
RDW: 16.3 % — ABNORMAL HIGH (ref 11.5–15.5)
WBC: 12.9 10*3/uL — ABNORMAL HIGH (ref 4.0–10.5)
nRBC: 0 % (ref 0.0–0.2)

## 2021-05-20 LAB — POCT I-STAT 7, (LYTES, BLD GAS, ICA,H+H)
Acid-base deficit: 11 mmol/L — ABNORMAL HIGH (ref 0.0–2.0)
Bicarbonate: 15.7 mmol/L — ABNORMAL LOW (ref 20.0–28.0)
Calcium, Ion: 1.1 mmol/L — ABNORMAL LOW (ref 1.15–1.40)
HCT: 27 % — ABNORMAL LOW (ref 39.0–52.0)
Hemoglobin: 9.2 g/dL — ABNORMAL LOW (ref 13.0–17.0)
O2 Saturation: 99 %
Potassium: 3.6 mmol/L (ref 3.5–5.1)
Sodium: 139 mmol/L (ref 135–145)
TCO2: 17 mmol/L — ABNORMAL LOW (ref 22–32)
pCO2 arterial: 35.2 mmHg (ref 32–48)
pH, Arterial: 7.257 — ABNORMAL LOW (ref 7.35–7.45)
pO2, Arterial: 166 mmHg — ABNORMAL HIGH (ref 83–108)

## 2021-05-20 LAB — BASIC METABOLIC PANEL
Anion gap: 10 (ref 5–15)
BUN: 19 mg/dL (ref 8–23)
CO2: 14 mmol/L — ABNORMAL LOW (ref 22–32)
Calcium: 8.1 mg/dL — ABNORMAL LOW (ref 8.9–10.3)
Chloride: 110 mmol/L (ref 98–111)
Creatinine, Ser: 1.85 mg/dL — ABNORMAL HIGH (ref 0.61–1.24)
GFR, Estimated: 36 mL/min — ABNORMAL LOW (ref >60)
Glucose, Bld: 278 mg/dL — ABNORMAL HIGH (ref 70–99)
Potassium: 3.8 mmol/L (ref 3.5–5.1)
Sodium: 134 mmol/L — ABNORMAL LOW (ref 135–145)

## 2021-05-20 LAB — PREPARE RBC (CROSSMATCH)

## 2021-05-20 LAB — PROTIME-INR
INR: 1.2 (ref 0.8–1.2)
INR: 1.4 — ABNORMAL HIGH (ref 0.8–1.2)
Prothrombin Time: 14.7 seconds (ref 11.4–15.2)
Prothrombin Time: 17 seconds — ABNORMAL HIGH (ref 11.4–15.2)

## 2021-05-20 LAB — BLOOD GAS, ARTERIAL
Acid-base deficit: 15.4 mmol/L — ABNORMAL HIGH (ref 0.0–2.0)
Bicarbonate: 11.5 mmol/L — ABNORMAL LOW (ref 20.0–28.0)
O2 Saturation: 97.9 %
Patient temperature: 36.5
pCO2 arterial: 29 mmHg — ABNORMAL LOW (ref 32–48)
pH, Arterial: 7.2 — ABNORMAL LOW (ref 7.35–7.45)
pO2, Arterial: 168 mmHg — ABNORMAL HIGH (ref 83–108)

## 2021-05-20 LAB — APTT
aPTT: 36 seconds (ref 24–36)
aPTT: 37 seconds — ABNORMAL HIGH (ref 24–36)

## 2021-05-20 LAB — MAGNESIUM: Magnesium: 1.6 mg/dL — ABNORMAL LOW (ref 1.7–2.4)

## 2021-05-20 LAB — GLUCOSE, CAPILLARY
Glucose-Capillary: 150 mg/dL — ABNORMAL HIGH (ref 70–99)
Glucose-Capillary: 190 mg/dL — ABNORMAL HIGH (ref 70–99)

## 2021-05-20 LAB — LACTIC ACID, PLASMA: Lactic Acid, Venous: 7.6 mmol/L (ref 0.5–1.9)

## 2021-05-20 LAB — MRSA NEXT GEN BY PCR, NASAL: MRSA by PCR Next Gen: NOT DETECTED

## 2021-05-20 SURGERY — COLECTOMY, PARTIAL, ROBOT-ASSISTED, LAPAROSCOPIC
Anesthesia: General

## 2021-05-20 MED ORDER — PHENYLEPHRINE HCL-NACL 20-0.9 MG/250ML-% IV SOLN
INTRAVENOUS | Status: DC | PRN
  Administered 2021-05-20: 35 ug/min via INTRAVENOUS

## 2021-05-20 MED ORDER — ALBUMIN HUMAN 5 % IV SOLN
INTRAVENOUS | Status: AC
  Filled 2021-05-20: qty 250

## 2021-05-20 MED ORDER — CHLORHEXIDINE GLUCONATE 0.12 % MT SOLN
15.0000 mL | Freq: Once | OROMUCOSAL | Status: AC
  Administered 2021-05-20: 15 mL via OROMUCOSAL

## 2021-05-20 MED ORDER — SODIUM CHLORIDE 0.9% IV SOLUTION: Freq: Once | INTRAVENOUS | Status: AC

## 2021-05-20 MED ORDER — DORZOLAMIDE HCL 2 % OP SOLN
1.0000 [drp] | Freq: Two times a day (BID) | OPHTHALMIC | Status: DC
  Administered 2021-05-21 – 2021-05-30 (×20): 1 [drp] via OPHTHALMIC
  Filled 2021-05-20: qty 10

## 2021-05-20 MED ORDER — ACETAMINOPHEN 500 MG PO TABS
1000.0000 mg | ORAL_TABLET | ORAL | Status: AC
  Administered 2021-05-20: 1000 mg via ORAL
  Filled 2021-05-20: qty 2

## 2021-05-20 MED ORDER — DIPHENHYDRAMINE HCL 12.5 MG/5ML PO ELIX
12.5000 mg | ORAL_SOLUTION | Freq: Four times a day (QID) | ORAL | Status: DC | PRN
  Administered 2021-05-26 – 2021-05-29 (×4): 12.5 mg via ORAL
  Filled 2021-05-20 (×4): qty 5

## 2021-05-20 MED ORDER — MAGNESIUM SULFATE 2 GM/50ML IV SOLN
2.0000 g | Freq: Once | INTRAVENOUS | Status: AC
  Administered 2021-05-20: 2 g via INTRAVENOUS
  Filled 2021-05-20: qty 50

## 2021-05-20 MED ORDER — FUROSEMIDE 10 MG/ML IJ SOLN
INTRAMUSCULAR | Status: DC
Start: 2021-05-20 — End: 2021-05-20
  Filled 2021-05-20: qty 4

## 2021-05-20 MED ORDER — STERILE WATER FOR INJECTION IV SOLN
INTRAMUSCULAR | Status: DC
  Filled 2021-05-20: qty 150
  Filled 2021-05-20: qty 1000
  Filled 2021-05-20 (×2): qty 150

## 2021-05-20 MED ORDER — FUROSEMIDE 10 MG/ML IJ SOLN
80.0000 mg | Freq: Once | INTRAMUSCULAR | Status: AC
  Administered 2021-05-20: 80 mg via INTRAVENOUS
  Filled 2021-05-20: qty 8

## 2021-05-20 MED ORDER — SODIUM CHLORIDE 0.9 % IV SOLN
2.0000 g | INTRAVENOUS | Status: AC
  Administered 2021-05-20: 2 g via INTRAVENOUS
  Filled 2021-05-20: qty 2

## 2021-05-20 MED ORDER — FENTANYL CITRATE PF 50 MCG/ML IJ SOSY
PREFILLED_SYRINGE | INTRAMUSCULAR | Status: AC
  Filled 2021-05-20: qty 1

## 2021-05-20 MED ORDER — ENSURE SURGERY PO LIQD
237.0000 mL | Freq: Two times a day (BID) | ORAL | Status: DC
  Administered 2021-05-21 – 2021-05-22 (×3): 237 mL via ORAL
  Filled 2021-05-20 (×4): qty 237

## 2021-05-20 MED ORDER — ALVIMOPAN 12 MG PO CAPS
12.0000 mg | ORAL_CAPSULE | Freq: Two times a day (BID) | ORAL | Status: AC
  Administered 2021-05-21 – 2021-05-27 (×13): 12 mg via ORAL
  Filled 2021-05-20 (×15): qty 1

## 2021-05-20 MED ORDER — ONDANSETRON HCL 4 MG PO TABS: 4.0000 mg | ORAL_TABLET | Freq: Four times a day (QID) | ORAL | Status: DC | PRN

## 2021-05-20 MED ORDER — HYDRALAZINE HCL 50 MG PO TABS: 100.0000 mg | ORAL_TABLET | Freq: Two times a day (BID) | ORAL | Status: DC

## 2021-05-20 MED ORDER — EPHEDRINE SULFATE-NACL 50-0.9 MG/10ML-% IV SOSY
PREFILLED_SYRINGE | INTRAVENOUS | Status: DC | PRN
  Administered 2021-05-20: 10 mg via INTRAVENOUS

## 2021-05-20 MED ORDER — PHENYLEPHRINE HCL-NACL 20-0.9 MG/250ML-% IV SOLN
INTRAVENOUS | Status: AC
  Filled 2021-05-20: qty 250

## 2021-05-20 MED ORDER — SODIUM CHLORIDE 0.9 % IV SOLN
2.0000 g | Freq: Two times a day (BID) | INTRAVENOUS | Status: AC
  Administered 2021-05-21: 2 g via INTRAVENOUS
  Filled 2021-05-20: qty 2

## 2021-05-20 MED ORDER — FLUORESCEIN SODIUM 10 % IV SOLN
INTRAVENOUS | Status: DC | PRN
Start: 2021-05-20 — End: 2021-05-20
  Administered 2021-05-20: 5 mg via INTRAVENOUS

## 2021-05-20 MED ORDER — PROPOFOL 10 MG/ML IV BOLUS
INTRAVENOUS | Status: AC
  Filled 2021-05-20: qty 20

## 2021-05-20 MED ORDER — BUPIVACAINE-EPINEPHRINE (PF) 0.25% -1:200000 IJ SOLN
INTRAMUSCULAR | Status: AC
  Filled 2021-05-20: qty 30

## 2021-05-20 MED ORDER — ACETAMINOPHEN 500 MG PO TABS: 1000.0000 mg | ORAL_TABLET | Freq: Once | ORAL | Status: DC

## 2021-05-20 MED ORDER — OXYCODONE HCL 5 MG PO TABS: 5.0000 mg | ORAL_TABLET | Freq: Once | ORAL | Status: DC | PRN

## 2021-05-20 MED ORDER — DEXAMETHASONE SODIUM PHOSPHATE 10 MG/ML IJ SOLN
INTRAMUSCULAR | Status: DC | PRN
Start: 2021-05-20 — End: 2021-05-20
  Administered 2021-05-20: 8 mg via INTRAVENOUS

## 2021-05-20 MED ORDER — BUPIVACAINE LIPOSOME 1.3 % IJ SUSP
INTRAMUSCULAR | Status: AC
  Filled 2021-05-20: qty 20

## 2021-05-20 MED ORDER — SODIUM CHLORIDE 0.9% IV SOLUTION: Freq: Once | INTRAVENOUS | Status: DC

## 2021-05-20 MED ORDER — ONDANSETRON HCL 4 MG/2ML IJ SOLN
4.0000 mg | Freq: Once | INTRAMUSCULAR | Status: AC | PRN
  Administered 2021-05-20: 4 mg via INTRAVENOUS

## 2021-05-20 MED ORDER — EPHEDRINE SULFATE (PRESSORS) 50 MG/ML IJ SOLN
INTRAMUSCULAR | Status: DC | PRN
  Administered 2021-05-20: 5 mg via INTRAVENOUS

## 2021-05-20 MED ORDER — RINGERS IRRIGATION IR SOLN
Status: DC | PRN
  Administered 2021-05-20: 1000 mL

## 2021-05-20 MED ORDER — METFORMIN HCL ER 500 MG PO TB24: 500.0000 mg | ORAL_TABLET | Freq: Two times a day (BID) | ORAL | Status: DC

## 2021-05-20 MED ORDER — ALBUMIN HUMAN 5 % IV SOLN
12.5000 g | Freq: Once | INTRAVENOUS | Status: AC
Start: 2021-05-20 — End: 2021-05-20
  Administered 2021-05-20: 12.5 g via INTRAVENOUS

## 2021-05-20 MED ORDER — LACTATED RINGERS IV SOLN: INTRAVENOUS | Status: DC

## 2021-05-20 MED ORDER — TRAMADOL HCL 50 MG PO TABS
50.0000 mg | ORAL_TABLET | Freq: Four times a day (QID) | ORAL | Status: DC | PRN
  Administered 2021-05-25 (×2): 50 mg via ORAL
  Filled 2021-05-20 (×2): qty 1

## 2021-05-20 MED ORDER — ONDANSETRON HCL 4 MG/2ML IJ SOLN
INTRAMUSCULAR | Status: AC
  Filled 2021-05-20: qty 2

## 2021-05-20 MED ORDER — ROCURONIUM BROMIDE 10 MG/ML (PF) SYRINGE
PREFILLED_SYRINGE | INTRAVENOUS | Status: AC
  Filled 2021-05-20: qty 10

## 2021-05-20 MED ORDER — ROCURONIUM BROMIDE 10 MG/ML (PF) SYRINGE
PREFILLED_SYRINGE | INTRAVENOUS | Status: DC | PRN
  Administered 2021-05-20 (×2): 10 mg via INTRAVENOUS
  Administered 2021-05-20: 90 mg via INTRAVENOUS

## 2021-05-20 MED ORDER — SODIUM CHLORIDE 0.9 % IV SOLN: 10.0000 mL/h | Freq: Once | INTRAVENOUS | Status: DC

## 2021-05-20 MED ORDER — DEXAMETHASONE SODIUM PHOSPHATE 10 MG/ML IJ SOLN
INTRAMUSCULAR | Status: AC
  Filled 2021-05-20: qty 1

## 2021-05-20 MED ORDER — ENSURE PRE-SURGERY PO LIQD: 592.0000 mL | Freq: Once | ORAL | Status: DC

## 2021-05-20 MED ORDER — LACTATED RINGERS IV SOLN: INTRAVENOUS | Status: DC | PRN

## 2021-05-20 MED ORDER — MAGNESIUM SULFATE 2 GM/50ML IV SOLN
2.0000 g | Freq: Once | INTRAVENOUS | Status: AC
  Administered 2021-05-21: 2 g via INTRAVENOUS
  Filled 2021-05-20: qty 50

## 2021-05-20 MED ORDER — BENAZEPRIL HCL 20 MG PO TABS
40.0000 mg | ORAL_TABLET | Freq: Every day | ORAL | Status: DC
Start: 2021-05-22 — End: 2021-05-20

## 2021-05-20 MED ORDER — LIDOCAINE HCL 2 % IJ SOLN
INTRAMUSCULAR | Status: AC
  Filled 2021-05-20: qty 20

## 2021-05-20 MED ORDER — LATANOPROST 0.005 % OP SOLN
1.0000 [drp] | Freq: Every day | OPHTHALMIC | Status: DC
  Administered 2021-05-21 – 2021-05-29 (×10): 1 [drp] via OPHTHALMIC
  Filled 2021-05-20: qty 2.5

## 2021-05-20 MED ORDER — FENTANYL CITRATE (PF) 100 MCG/2ML IJ SOLN
INTRAMUSCULAR | Status: DC | PRN
  Administered 2021-05-20: 50 ug via INTRAVENOUS
  Administered 2021-05-20: 25 ug via INTRAVENOUS
  Administered 2021-05-20: 100 ug via INTRAVENOUS
  Administered 2021-05-20 (×3): 50 ug via INTRAVENOUS
  Administered 2021-05-20: 75 ug via INTRAVENOUS
  Administered 2021-05-20: 50 ug via INTRAVENOUS

## 2021-05-20 MED ORDER — LIDOCAINE HCL 1 % IJ SOLN
INTRAMUSCULAR | Status: AC
  Filled 2021-05-20: qty 20

## 2021-05-20 MED ORDER — PHENYLEPHRINE 40 MCG/ML (10ML) SYRINGE FOR IV PUSH (FOR BLOOD PRESSURE SUPPORT)
PREFILLED_SYRINGE | INTRAVENOUS | Status: AC
  Filled 2021-05-20: qty 10

## 2021-05-20 MED ORDER — AMISULPRIDE (ANTIEMETIC) 5 MG/2ML IV SOLN
10.0000 mg | Freq: Once | INTRAVENOUS | Status: AC
  Administered 2021-05-20: 10 mg via INTRAVENOUS

## 2021-05-20 MED ORDER — PROPOFOL 10 MG/ML IV BOLUS
INTRAVENOUS | Status: DC | PRN
  Administered 2021-05-20: 20 mg via INTRAVENOUS
  Administered 2021-05-20: 100 mg via INTRAVENOUS

## 2021-05-20 MED ORDER — BUPIVACAINE LIPOSOME 1.3 % IJ SUSP
INTRAMUSCULAR | Status: DC | PRN
  Administered 2021-05-20: 20 mL

## 2021-05-20 MED ORDER — HYDROCHLOROTHIAZIDE 12.5 MG PO TABS: 12.5000 mg | ORAL_TABLET | Freq: Every day | ORAL | Status: DC

## 2021-05-20 MED ORDER — BUPIVACAINE LIPOSOME 1.3 % IJ SUSP: 20.0000 mL | Freq: Once | INTRAMUSCULAR | Status: DC

## 2021-05-20 MED ORDER — ONDANSETRON HCL 4 MG/2ML IJ SOLN
INTRAMUSCULAR | Status: DC | PRN
  Administered 2021-05-20: 4 mg via INTRAVENOUS

## 2021-05-20 MED ORDER — ONDANSETRON HCL 4 MG/2ML IJ SOLN
4.0000 mg | Freq: Four times a day (QID) | INTRAMUSCULAR | Status: DC | PRN
  Administered 2021-05-27: 4 mg via INTRAVENOUS
  Filled 2021-05-20: qty 2

## 2021-05-20 MED ORDER — PHENYLEPHRINE HCL-NACL 20-0.9 MG/250ML-% IV SOLN
0.0000 ug/min | INTRAVENOUS | Status: DC
  Administered 2021-05-20: 40 ug/min via INTRAVENOUS

## 2021-05-20 MED ORDER — ENSURE PRE-SURGERY PO LIQD: 296.0000 mL | Freq: Once | ORAL | Status: DC

## 2021-05-20 MED ORDER — ENOXAPARIN SODIUM 40 MG/0.4ML IJ SOSY: 40.0000 mg | PREFILLED_SYRINGE | INTRAMUSCULAR | Status: DC

## 2021-05-20 MED ORDER — HYDROMORPHONE HCL 1 MG/ML IJ SOLN
0.5000 mg | INTRAMUSCULAR | Status: DC | PRN
  Administered 2021-05-20 – 2021-05-25 (×7): 0.5 mg via INTRAVENOUS
  Filled 2021-05-20 (×3): qty 1
  Filled 2021-05-20: qty 0.5
  Filled 2021-05-20 (×2): qty 1
  Filled 2021-05-20: qty 0.5

## 2021-05-20 MED ORDER — PANTOPRAZOLE SODIUM 40 MG PO TBEC
40.0000 mg | DELAYED_RELEASE_TABLET | Freq: Two times a day (BID) | ORAL | Status: DC
  Administered 2021-05-21: 40 mg via ORAL
  Filled 2021-05-20: qty 1

## 2021-05-20 MED ORDER — ALVIMOPAN 12 MG PO CAPS
12.0000 mg | ORAL_CAPSULE | ORAL | Status: AC
  Administered 2021-05-20: 12 mg via ORAL
  Filled 2021-05-20: qty 1

## 2021-05-20 MED ORDER — METOPROLOL SUCCINATE ER 25 MG PO TB24: 50.0000 mg | ORAL_TABLET | Freq: Every day | ORAL | Status: DC

## 2021-05-20 MED ORDER — ALBUMIN HUMAN 5 % IV SOLN
12.5000 g | Freq: Once | INTRAVENOUS | Status: AC
  Administered 2021-05-20: 12.5 g via INTRAVENOUS

## 2021-05-20 MED ORDER — BUPIVACAINE-EPINEPHRINE 0.25% -1:200000 IJ SOLN
INTRAMUSCULAR | Status: DC | PRN
  Administered 2021-05-20: 30 mL

## 2021-05-20 MED ORDER — CALCIUM CHLORIDE 10 % IV SOLN
INTRAVENOUS | Status: DC | PRN
  Administered 2021-05-20: 1 g via INTRAVENOUS

## 2021-05-20 MED ORDER — GABAPENTIN 300 MG PO CAPS: 300.0000 mg | ORAL_CAPSULE | Freq: Two times a day (BID) | ORAL | Status: DC

## 2021-05-20 MED ORDER — CHLORHEXIDINE GLUCONATE CLOTH 2 % EX PADS
6.0000 | MEDICATED_PAD | Freq: Every day | CUTANEOUS | Status: DC
  Administered 2021-05-20 – 2021-05-24 (×4): 6 via TOPICAL

## 2021-05-20 MED ORDER — NOREPINEPHRINE 4 MG/250ML-% IV SOLN
0.0000 ug/min | INTRAVENOUS | Status: DC
  Administered 2021-05-20: 4 ug/min via INTRAVENOUS
  Administered 2021-05-21: 30 ug/min via INTRAVENOUS
  Administered 2021-05-21: 27 ug/min via INTRAVENOUS
  Administered 2021-05-21: 30 ug/min via INTRAVENOUS
  Filled 2021-05-20 (×3): qty 250

## 2021-05-20 MED ORDER — NOREPINEPHRINE 4 MG/250ML-% IV SOLN
INTRAVENOUS | Status: AC
  Administered 2021-05-20: 4 mg
  Filled 2021-05-20: qty 250

## 2021-05-20 MED ORDER — FENTANYL CITRATE (PF) 100 MCG/2ML IJ SOLN
INTRAMUSCULAR | Status: AC
  Filled 2021-05-20: qty 2

## 2021-05-20 MED ORDER — SIMETHICONE 80 MG PO CHEW: 40.0000 mg | CHEWABLE_TABLET | Freq: Four times a day (QID) | ORAL | Status: DC | PRN

## 2021-05-20 MED ORDER — SACCHAROMYCES BOULARDII 250 MG PO CAPS
250.0000 mg | ORAL_CAPSULE | Freq: Two times a day (BID) | ORAL | Status: DC
  Administered 2021-05-21 – 2021-05-30 (×18): 250 mg via ORAL
  Filled 2021-05-20 (×18): qty 1

## 2021-05-20 MED ORDER — METHYLENE BLUE 0.5 % INJ SOLN
INTRAVENOUS | Status: AC
  Filled 2021-05-20: qty 10

## 2021-05-20 MED ORDER — FUROSEMIDE 10 MG/ML IJ SOLN
INTRAMUSCULAR | Status: AC
  Filled 2021-05-20: qty 2

## 2021-05-20 MED ORDER — HEPARIN SODIUM (PORCINE) 5000 UNIT/ML IJ SOLN
5000.0000 [IU] | Freq: Once | INTRAMUSCULAR | Status: AC
  Administered 2021-05-20: 5000 [IU] via SUBCUTANEOUS
  Filled 2021-05-20: qty 1

## 2021-05-20 MED ORDER — FENTANYL CITRATE PF 50 MCG/ML IJ SOSY
25.0000 ug | PREFILLED_SYRINGE | INTRAMUSCULAR | Status: DC | PRN
  Administered 2021-05-20: 25 ug via INTRAVENOUS

## 2021-05-20 MED ORDER — LIDOCAINE HCL (PF) 2 % IJ SOLN
INTRAMUSCULAR | Status: AC
  Filled 2021-05-20: qty 5

## 2021-05-20 MED ORDER — LIDOCAINE 2% (20 MG/ML) 5 ML SYRINGE
INTRAMUSCULAR | Status: DC | PRN
  Administered 2021-05-20: 1.5 mg/kg/h via INTRAVENOUS

## 2021-05-20 MED ORDER — SUGAMMADEX SODIUM 500 MG/5ML IV SOLN
INTRAVENOUS | Status: DC | PRN
  Administered 2021-05-20: 300 mg via INTRAVENOUS

## 2021-05-20 MED ORDER — SUGAMMADEX SODIUM 500 MG/5ML IV SOLN
INTRAVENOUS | Status: AC
  Filled 2021-05-20: qty 5

## 2021-05-20 MED ORDER — SODIUM CHLORIDE 0.9 % IV SOLN
250.0000 mL | INTRAVENOUS | Status: DC
  Administered 2021-05-20 – 2021-05-22 (×3): 250 mL via INTRAVENOUS

## 2021-05-20 MED ORDER — GABAPENTIN 300 MG PO CAPS
300.0000 mg | ORAL_CAPSULE | ORAL | Status: AC
  Administered 2021-05-20: 300 mg via ORAL
  Filled 2021-05-20: qty 1

## 2021-05-20 MED ORDER — SODIUM CHLORIDE 0.9 % IV BOLUS
1000.0000 mL | Freq: Once | INTRAVENOUS | Status: AC
  Administered 2021-05-20: 1000 mL via INTRAVENOUS

## 2021-05-20 MED ORDER — LACTATED RINGERS IV SOLN
INTRAVENOUS | Status: DC
  Administered 2021-05-20: 1000 mL via INTRAVENOUS

## 2021-05-20 MED ORDER — SODIUM CHLORIDE 0.9 % IV SOLN
Freq: Once | INTRAVENOUS | Status: AC
  Administered 2021-05-20: 500 mL via INTRAVENOUS

## 2021-05-20 MED ORDER — AMISULPRIDE (ANTIEMETIC) 5 MG/2ML IV SOLN
INTRAVENOUS | Status: AC
  Filled 2021-05-20: qty 4

## 2021-05-20 MED ORDER — ORAL CARE MOUTH RINSE: 15.0000 mL | Freq: Once | OROMUCOSAL | Status: AC

## 2021-05-20 MED ORDER — KCL IN DEXTROSE-NACL 20-5-0.45 MEQ/L-%-% IV SOLN: INTRAVENOUS | Status: DC

## 2021-05-20 MED ORDER — ACETAMINOPHEN 500 MG PO TABS
1000.0000 mg | ORAL_TABLET | Freq: Four times a day (QID) | ORAL | Status: AC
  Administered 2021-05-21 – 2021-05-27 (×23): 1000 mg via ORAL
  Filled 2021-05-20 (×24): qty 2

## 2021-05-20 MED ORDER — LIDOCAINE 2% (20 MG/ML) 5 ML SYRINGE
INTRAMUSCULAR | Status: DC | PRN
  Administered 2021-05-20: 50 mg via INTRAVENOUS

## 2021-05-20 MED ORDER — DIPHENHYDRAMINE HCL 50 MG/ML IJ SOLN: 12.5000 mg | Freq: Four times a day (QID) | INTRAMUSCULAR | Status: DC | PRN

## 2021-05-20 MED ORDER — PHENYLEPHRINE HCL-NACL 20-0.9 MG/250ML-% IV SOLN
25.0000 ug/min | INTRAVENOUS | Status: DC
  Administered 2021-05-20: 180 ug/min via INTRAVENOUS
  Administered 2021-05-20: 80 ug/min via INTRAVENOUS
  Administered 2021-05-21 (×2): 180 ug/min via INTRAVENOUS
  Administered 2021-05-21: 140 ug/min via INTRAVENOUS
  Administered 2021-05-21: 180 ug/min via INTRAVENOUS
  Filled 2021-05-20 (×5): qty 250

## 2021-05-20 MED ORDER — ALUM & MAG HYDROXIDE-SIMETH 200-200-20 MG/5ML PO SUSP
30.0000 mL | Freq: Four times a day (QID) | ORAL | Status: DC | PRN
  Administered 2021-05-27: 30 mL via ORAL
  Filled 2021-05-20: qty 30

## 2021-05-20 MED ORDER — FENTANYL CITRATE (PF) 250 MCG/5ML IJ SOLN
INTRAMUSCULAR | Status: AC
  Filled 2021-05-20: qty 5

## 2021-05-20 MED ORDER — DILTIAZEM HCL ER COATED BEADS 180 MG PO CP24: 180.0000 mg | ORAL_CAPSULE | Freq: Every day | ORAL | Status: DC

## 2021-05-20 MED ORDER — OXYCODONE HCL 5 MG/5ML PO SOLN: 5.0000 mg | Freq: Once | ORAL | Status: DC | PRN

## 2021-05-20 SURGICAL SUPPLY — 97 items
BAG COUNTER SPONGE SURGICOUNT (BAG) ×3 IMPLANT
BAG SPNG CNTER NS LX DISP (BAG) ×1
BLADE EXTENDED COATED 6.5IN (ELECTRODE) IMPLANT
CANNULA REDUC XI 12-8 STAPL (CANNULA) ×2
CANNULA REDUCER 12-8 DVNC XI (CANNULA) IMPLANT
CELLS DAT CNTRL 66122 CELL SVR (MISCELLANEOUS) IMPLANT
COVER SURGICAL LIGHT HANDLE (MISCELLANEOUS) ×6 IMPLANT
COVER TIP SHEARS 8 DVNC (MISCELLANEOUS) ×2 IMPLANT
COVER TIP SHEARS 8MM DA VINCI (MISCELLANEOUS) ×2
DRAIN CHANNEL 19F RND (DRAIN) IMPLANT
DRAPE ARM DVNC X/XI (DISPOSABLE) ×8 IMPLANT
DRAPE COLUMN DVNC XI (DISPOSABLE) ×2 IMPLANT
DRAPE DA VINCI XI ARM (DISPOSABLE) ×8
DRAPE DA VINCI XI COLUMN (DISPOSABLE) ×2
DRAPE SURG IRRIG POUCH 19X23 (DRAPES) ×3 IMPLANT
DRSG OPSITE POSTOP 3X4 (GAUZE/BANDAGES/DRESSINGS) ×1 IMPLANT
DRSG OPSITE POSTOP 4X10 (GAUZE/BANDAGES/DRESSINGS) IMPLANT
DRSG OPSITE POSTOP 4X6 (GAUZE/BANDAGES/DRESSINGS) IMPLANT
DRSG OPSITE POSTOP 4X8 (GAUZE/BANDAGES/DRESSINGS) IMPLANT
ELECT PENCIL ROCKER SW 15FT (MISCELLANEOUS) ×3 IMPLANT
ELECT REM PT RETURN 15FT ADLT (MISCELLANEOUS) ×3 IMPLANT
ENDOLOOP SUT PDS II  0 18 (SUTURE)
ENDOLOOP SUT PDS II 0 18 (SUTURE) IMPLANT
EVACUATOR SILICONE 100CC (DRAIN) IMPLANT
GLOVE SURG ENC MOIS LTX SZ6.5 (GLOVE) ×9 IMPLANT
GLOVE SURG UNDER LTX SZ6.5 (GLOVE) ×3 IMPLANT
GLOVE SURG UNDER POLY LF SZ7 (GLOVE) ×6 IMPLANT
GOWN STRL REUS W/TWL XL LVL3 (GOWN DISPOSABLE) ×9 IMPLANT
GRASPER SUT TROCAR 14GX15 (MISCELLANEOUS) IMPLANT
HOLDER FOLEY CATH W/STRAP (MISCELLANEOUS) ×3 IMPLANT
IRRIG SUCT STRYKERFLOW 2 WTIP (MISCELLANEOUS) ×2
IRRIGATION SUCT STRKRFLW 2 WTP (MISCELLANEOUS) ×2 IMPLANT
KIT PROCEDURE DA VINCI SI (MISCELLANEOUS)
KIT PROCEDURE DVNC SI (MISCELLANEOUS) IMPLANT
KIT TURNOVER KIT A (KITS) IMPLANT
NDL INSUFFLATION 14GA 120MM (NEEDLE) ×2 IMPLANT
NEEDLE INSUFFLATION 14GA 120MM (NEEDLE) ×2 IMPLANT
PACK CARDIOVASCULAR III (CUSTOM PROCEDURE TRAY) ×3 IMPLANT
PACK COLON (CUSTOM PROCEDURE TRAY) ×3 IMPLANT
PAD POSITIONING PINK XL (MISCELLANEOUS) ×3 IMPLANT
RELOAD STAPLE 60 2.5 WHT DVNC (STAPLE) IMPLANT
RELOAD STAPLE 60 3.5 BLU DVNC (STAPLE) IMPLANT
RELOAD STAPLE 60 4.3 GRN DVNC (STAPLE) IMPLANT
RELOAD STAPLER 2.5X60 WHT DVNC (STAPLE) ×1 IMPLANT
RELOAD STAPLER 3.5X60 BLU DVNC (STAPLE) ×1 IMPLANT
RELOAD STAPLER 4.3X60 GRN DVNC (STAPLE) ×2 IMPLANT
RETRACTOR WND ALEXIS 18 MED (MISCELLANEOUS) IMPLANT
RTRCTR WOUND ALEXIS 18CM MED (MISCELLANEOUS)
SCISSORS LAP 5X35 DISP (ENDOMECHANICALS) ×1 IMPLANT
SEAL CANN UNIV 5-8 DVNC XI (MISCELLANEOUS) ×6 IMPLANT
SEAL XI 5MM-8MM UNIVERSAL (MISCELLANEOUS) ×6
SEALER VESSEL DA VINCI XI (MISCELLANEOUS) ×2
SEALER VESSEL EXT DVNC XI (MISCELLANEOUS) ×2 IMPLANT
SOLUTION ELECTROLUBE (MISCELLANEOUS) ×3 IMPLANT
SPIKE FLUID TRANSFER (MISCELLANEOUS) IMPLANT
STAPLER 60 DA VINCI SURE FORM (STAPLE) ×2
STAPLER 60 SUREFORM DVNC (STAPLE) IMPLANT
STAPLER CANNULA SEAL DVNC XI (STAPLE) IMPLANT
STAPLER CANNULA SEAL XI (STAPLE)
STAPLER ECHELON POWER CIR 29 (STAPLE) IMPLANT
STAPLER ECHELON POWER CIR 31 (STAPLE) IMPLANT
STAPLER RELOAD 2.5X60 WHITE (STAPLE) ×2
STAPLER RELOAD 2.5X60 WHT DVNC (STAPLE) ×1
STAPLER RELOAD 3.5X60 BLU DVNC (STAPLE) ×1
STAPLER RELOAD 3.5X60 BLUE (STAPLE) ×2
STAPLER RELOAD 4.3X60 GREEN (STAPLE) ×4
STAPLER RELOAD 4.3X60 GRN DVNC (STAPLE) ×2
STOPCOCK 4 WAY LG BORE MALE ST (IV SETS) ×6 IMPLANT
SUT ETHILON 2 0 PS N (SUTURE) IMPLANT
SUT NOVA NAB DX-16 0-1 5-0 T12 (SUTURE) ×6 IMPLANT
SUT PROLENE 2 0 KS (SUTURE) IMPLANT
SUT SILK 2 0 (SUTURE) ×2
SUT SILK 2 0 SH CR/8 (SUTURE) IMPLANT
SUT SILK 2-0 18XBRD TIE 12 (SUTURE) ×2 IMPLANT
SUT SILK 3 0 (SUTURE)
SUT SILK 3 0 SH CR/8 (SUTURE) ×3 IMPLANT
SUT SILK 3-0 18XBRD TIE 12 (SUTURE) IMPLANT
SUT V-LOC BARB 180 2/0GR6 GS22 (SUTURE) ×4
SUT VIC AB 2-0 SH 18 (SUTURE) IMPLANT
SUT VIC AB 2-0 SH 27 (SUTURE)
SUT VIC AB 2-0 SH 27X BRD (SUTURE) IMPLANT
SUT VIC AB 3-0 SH 18 (SUTURE) IMPLANT
SUT VIC AB 4-0 PS2 18 (SUTURE) ×1 IMPLANT
SUT VIC AB 4-0 PS2 27 (SUTURE) ×6 IMPLANT
SUT VICRYL 0 UR6 27IN ABS (SUTURE) ×3 IMPLANT
SUTURE V-LC BRB 180 2/0GR6GS22 (SUTURE) IMPLANT
SYR 10ML ECCENTRIC (SYRINGE) ×3 IMPLANT
SYS LAPSCP GELPORT 120MM (MISCELLANEOUS)
SYS WOUND ALEXIS 18CM MED (MISCELLANEOUS)
SYSTEM LAPSCP GELPORT 120MM (MISCELLANEOUS) IMPLANT
SYSTEM WOUND ALEXIS 18CM MED (MISCELLANEOUS) IMPLANT
TOWEL OR 17X26 10 PK STRL BLUE (TOWEL DISPOSABLE) IMPLANT
TOWEL OR NON WOVEN STRL DISP B (DISPOSABLE) ×3 IMPLANT
TRAY FOLEY MTR SLVR 16FR STAT (SET/KITS/TRAYS/PACK) ×3 IMPLANT
TROCAR ADV FIXATION 5X100MM (TROCAR) ×3 IMPLANT
TUBING CONNECTING 10 (TUBING) ×6 IMPLANT
TUBING INSUFFLATION 10FT LAP (TUBING) ×3 IMPLANT

## 2021-05-20 NOTE — Procedures (Signed)
Name:  TIYON SANOR MRN:  [id number] DOB:  December 08, 1940  PROCEDURE NOTE  Procedure:  Central venous catheter placement.  Indications:  Need for intravenous access and hemodynamic monitoring.  Consent:  Consent was implied due to the emergency nature of the procedure.  Anesthesia:  A total of 10 mL of 1% Lidocaine was used for local infiltration anesthesia.  Procedure summary:  Appropriate equipment was assembled.  The patient was identified as [name] [name] and safety timeout was performed. The patient was placed in Trendelenburg position.  Sterile technique was used. The patient's left anterior chest wall was prepped using chlorhexidine / alcohol scrub and the field was draped in usual sterile fashion with full body drape. After the adequate anesthesia was achieved, the left subclavian vein was cannulated with the introducer needle without difficulty. A guide wire was advanced through the introducer needle, which was then withdrawn. A small skin incision was made at the point of wire entry, the dilator was inserted over the guide wire and appropriate dilation was obtained. The dilator was removed and 7 French triple-lumen catheter was advanced over the guide wire, which was then removed.  All ports were aspirated and flushed with normal saline without difficulty. The catheter was secured into place at 18 cm. Antibiotic patch was placed and sterile dressing was applied. Post-procedure chest x-ray was ordered.  Complications:  No immediate complications were noted.  Hemodynamic parameters and oxygenation remained stable throughout the procedure.  Estimated blood loss:  Less then 5 mL.    05/20/2021, 11:20 PM

## 2021-05-20 NOTE — Progress Notes (Signed)
An USGPIV (ultrasound guided PIV) has been placed for short-term vasopressor infusion. A correctly placed ivWatch must be used when administering Vasopressors. Should this treatment be needed beyond 72 hours, central line access should be obtained.  It will be the responsibility of the bedside nurse to follow best practice to prevent extravasations.   ?

## 2021-05-20 NOTE — H&P (Signed)
REFERRING PHYSICIAN:  Dr [name]   PROVIDER:  Monico Blitz, MD   MRN: [id number] DOB: 06-15-1940    Subjective      History of Present Illness: [name] [name] is a 81 y.o. male who was seen as an office consultation at the request of Dr. Montez Morita for evaluation of results from recent colonoscopy .   He underwent a colonoscopy for iron deficiency anemia several months ago.  7 sessile polyps were found in the transverse colon and cecum 3 to 8 mm in size.  These were removed.  Pathology shows tubular adenoma and 1 sessile serrated adenoma.    1 transverse colon polyp could not be retrieved.  A 12 mm polyp was found in the transverse colon the polyp was sessile and removed in a piecemeal technique.  2 sessile polyps were found in the sigmoid colon.  It appears that the transverse colon polyp that was removed piecemeal showed adenocarcinoma arising from a tubular adenoma with high-grade dysplasia.  Margins were unable to be assessed.  It does not appear that the transverse colon lesion was tattooed.  He then underwent MRI of the abdomen and pelvis for staging work-up (CT not done due to a possible allergy to iodinated contrast).  This did not show any signs of lymphadenopathy or metastatic disease.  No CEA level was noted in epic at that time.    We decided to proceed with a follow-up colonoscopy.  This was completed in early December.  3 more polyps were found in the transverse colon.  These were resected and removed.  A 10 mm polyp was found in the descending colon at approximately 54 cm from the anus.  This was biopsied and thought to correspond to the area of malignancy found on previous colonoscopy.  Area was tattooed distally.  Final pathology shows poorly differentiated adenocarcinoma.  CT scan of the chest abdomen and pelvis (completed with allergy protocol) shows no sign of metastatic disease.  CEA is mildly elevated at 5.2.  Patient is in reasonably good health  with some mild cardiac disease on Elliquis.  He walks 2 miles a day.  He has never had any abdominal surgeries before.     Review of Systems: A complete review of systems was obtained from the patient.  I have reviewed this information and discussed as appropriate with the patient.  See HPI as well for other ROS.     Medical History: Past Medical History         Past Medical History:  Diagnosis Date   Anemia     Arthritis     CAD (coronary artery disease)     Colon cancer (CMS-HCC)     Diabetes (CMS-HCC)     GERD (gastroesophageal reflux disease)     Glaucoma     H/O non-ST elevation myocardial infarction (NSTEMI)     Hyperlipemia     Hypertension               Patient Active Problem List  Diagnosis   Asymmetrical sensorineural hearing loss   CAD (coronary artery disease)   Colon cancer (CMS-HCC)   Duodenal adenoma   Hyperlipidemia   Iron deficiency anemia   Malignant tumor of prostate (CMS-HCC)   NSTEMI (non-ST elevated myocardial infarction) (CMS-HCC)   Paroxysmal atrial fibrillation (CMS-HCC)   Status post coronary artery stent placement      Past Surgical History  History reviewed. No pertinent surgical history.      Allergies  Allergies  Allergen Reactions   Amiodarone Rash      rash rash                     Current Outpatient Medications on File Prior to Visit  Medication Sig Dispense Refill   metFORMIN (GLUCOPHAGE-XR) 500 MG XR tablet Take 500 mg by mouth 2 (two) times daily       allopurinoL (ZYLOPRIM) 300 MG tablet Take by mouth       apixaban (ELIQUIS) 5 mg tablet Take 5 mg by mouth 2 (two) times daily       ascorbic acid, vitamin C, (VITAMIN C) 500 MG tablet Take 500 mg by mouth 2 (two) times daily       atorvastatin (LIPITOR) 80 MG tablet TAKE 1 TABLET BY MOUTH ONCE DAILY AT  6  PM       benazepriL (LOTENSIN) 40 MG tablet Take 1 tablet by mouth once daily       diltiazem (CARTIA XT) 180 MG CD capsule Take 1 capsule by mouth once  daily       hydrALAZINE (APRESOLINE) 100 MG tablet Take 100 mg by mouth 2 (two) times daily       hydroCHLOROthiazide (HYDRODIURIL) 12.5 MG tablet Take 12.5 mg by mouth once daily       iron polysaccharides (IRON POLYSACCHARIDES) 150 mg iron capsule Take 150 mg by mouth 2 (two) times daily       latanoprost (XALATAN) 0.005 % ophthalmic solution Apply 1 drop to eye at bedtime       metoprolol succinate (TOPROL-XL) 50 MG XL tablet Take 50 mg by mouth once daily       multivitamin (MULTIVITAMIN) tablet Take 1 tablet by mouth once daily       pantoprazole (PROTONIX) 40 MG DR tablet Take 1 tablet by mouth once daily       SIMBRINZA 1-0.2 % DrpS Place 1 drop into both eyes 2 (two) times daily        No current facility-administered medications on file prior to visit.      Family History  History reviewed. No pertinent family history.      Social History         Tobacco Use  Smoking Status Former  Smokeless Tobacco Never      Social History  Social History           Socioeconomic History   Marital status: Married  Tobacco Use   Smoking status: Former   Smokeless tobacco: Never        Objective:      Ht 5\' 11"  (1.803 m)    Wt 100.7 kg    BMI 30.96 kg/m     Exam Gen: NAD CV: RRR Lungs: CTAB Abd: soft, nontender       Labs, Imaging and Diagnostic Testing: CT personally reviewed.  No signs of wall thickening in descending or transverse colon.   Assessment and Plan:  Diagnoses and all orders for this visit:   Malignant neoplasm of colon, unspecified part of colon (CMS-HCC)    81 year old male who presents to the office with what appears to be a descending colon cancer.  This has been tattooed distally and marked ~54cm from the anus on colonoscopy.  I have recommended a robotic assisted partial colectomy.  We will plan on a definitive resection once we identify the area of tattoo.  Most likely this will be a sigmoidectomy or left hemicolectomy.  We have discussed  the  risk and benefits of this.  I believe he is in good condition for surgery.  Cardiac clearance has been obtained prior to surgery.     The surgery and anatomy were described to the patient as well as the risks of surgery and the possible complications.  These include: Bleeding, deep abdominal infections and possible wound complications such as hernia and infection, damage to adjacent structures, leak of surgical connections, which can lead to other surgeries and possibly an ostomy, possible need for other procedures, such as abscess drains in radiology, possible prolonged hospital stay, possible diarrhea from removal of part of the colon, possible constipation from narcotics, possible bowel, bladder or sexual dysfunction if having rectal surgery, prolonged fatigue/weakness or appetite loss, possible early recurrence of of disease, possible complications of their medical problems such as heart disease or arrhythmias or lung problems, death (less than 1%). I believe the patient understands and wishes to proceed with the surgery.

## 2021-05-20 NOTE — Progress Notes (Signed)
Patient ID: [name] [name], male   DOB: 1940-04-29, 81 y.o.   MRN: [id number]      Called by Dr. Christella Hartigan from anesthesiology to evaluate a postoperative patient in the recovery room following robotic right colectomy performed by Dr. Leighton Ruff today.  Patient had experienced significant hypotension.  Preoperative hemoglobin was 12.0.  Hemoglobin in the recovery room after several hours was 7.8.  Patient required volume resuscitation, pressor support, and was then given 2 units of packed red blood cells.  I saw the patient in the recovery room.  He was awake and responsive.  He denied any significant pain.  Abdomen was soft without obvious distention.  Surgical wounds were dry and intact.  There had been very little urine output.  Patient has an arterial line in place for monitoring.  He remains on pressor support.  I spoke to the patient's wife and daughter in the waiting room.  I explained to them that we were concerned about possible bleeding following his surgery.  I explained that we were monitoring him closely but there may be a need to return him to the operating room if he did not stabilize.  I also offered for them to come back and see the patient personally before leaving the hospital to go home.  The anesthesia team had contacted Dr. Leighton Ruff and brought her up-to-date.  We repeated his hemoglobin and obtain coagulation studies.  There had been an appropriate response to transfusion.  Patient was given third unit of packed red blood cells.  Patient had improved hemodynamically.  PT was slightly elevated at 17 with an INR of 1.4.  PTT was slightly elevated at 37.  I will discuss possible plasma transfusion with the critical care medicine team.  Patient will be admitted to the intensive care unit for monitoring overnight.  I will contact the critical care medicine team for consultation and assistance in management.  If the patient remains stable we will continue to monitor him closely.   If there is evidence of further hemodynamic instability or occult bleeding, then I think the patient will need to return to the operating room for exploratory laparotomy.  The family is aware of this possibility.  Armandina Gemma, MD [facility] [place] [facility] Office: [phone]

## 2021-05-20 NOTE — Anesthesia Procedure Notes (Signed)
Arterial Line Insertion Performed by: Lidia Collum, MD, anesthesiologist  Preanesthetic checklist: patient identified, risks and benefits discussed and surgical consent Lidocaine 1% used for infiltration Right, radial was placed Catheter size: 20 G Hand hygiene performed  and Seldinger technique used  Attempts: 1 Procedure performed using ultrasound guided technique. Ultrasound Notes:anatomy identified, needle tip was noted to be adjacent to the nerve/plexus identified and no ultrasound evidence of intravascular and/or intraneural injection Following insertion, dressing applied and Biopatch. Post procedure assessment: normal  Patient tolerated the procedure well with no immediate complications.

## 2021-05-20 NOTE — Progress Notes (Signed)
3rd transfudin going, DC 4th unit per Dr.Witman. 1st unitW [id number] sstarted @ [phone]- [phone], 2# [id number] @ [id number], #3 W [id number] Z 2021-2040.  Report given to unit, via phone, tx via stretcher to Room 1226. Family vs at PACU, wiss visit ICU after settle down. UOP 22cc tea colored, Dr Harlow Asa aware of it.

## 2021-05-20 NOTE — Progress Notes (Signed)
Patient ID: [name] [name], male   DOB: [birth year], 81 y.o.   MRN: [id number]      Patient seen and examined.  Now in ICU.  Awake and responsive.  Plan to transfuse 4th unit of PRBC's as soon as available.  Abd exam stable.  Incisional pain only.  Discussed with CCM (Dr. Milon Dikes) at bedside. PT mildly elevated (INR 1.4) - will give unit of plasma.  Continue to monitor closely for signs of bleeding.  Appreciate CCM evaluation and management.  Armandina Gemma, MD [facility] [place] practice Office: [phone]

## 2021-05-20 NOTE — Anesthesia Procedure Notes (Signed)
Procedure Name: Intubation Date/Time: 05/20/2021 11:48 AM Performed by: Lissa Morales, CRNA Pre-anesthesia Checklist: Patient identified, Emergency Drugs available, Suction available and Patient being monitored Patient Re-evaluated:Patient Re-evaluated prior to induction Oxygen Delivery Method: Circle system utilized Preoxygenation: Pre-oxygenation with 100% oxygen Induction Type: IV induction Ventilation: Mask ventilation without difficulty and Oral airway inserted - appropriate to patient size Laryngoscope Size: Mac and 4 Grade View: Grade II Tube type: Oral Tube size: 8.0 mm Number of attempts: 1 Airway Equipment and Method: Stylet and Oral airway Placement Confirmation: ETT inserted through vocal cords under direct vision, positive ETCO2 and breath sounds checked- equal and bilateral Secured at: 22 cm Tube secured with: Tape Dental Injury: Teeth and Oropharynx as per pre-operative assessment

## 2021-05-20 NOTE — Consult Note (Signed)
NAME:  [name] [name], MRN:  [id number], DOB:  1940-06-27, LOS: 0 ADMISSION DATE:  05/20/2021, CONSULTATION DATE: May 20, 2021 REFERRING MD: General surgery, CHIEF COMPLAINT: Postop hypertension History of Present Illness:   Patient is 81 year old Caucasian male who looks younger than his age had robotic transverse right-sided colectomy today came out of the OR started going hypotensive H&H check showed that that he dropped from around 12-7.8 was brought down to the ICU for pressors use we were consulted for persistent hypotension surgeon was caring for him bedside the surgeon does not think that the patient is actively bleeding at the moment nursing is not reporting any bleeding per rectum patient had history of coronary artery disease A-fib all anticoagulation antiplatelets have been stopped for few days.  Patient is not able to provide any history.     Objective   Blood pressure (!) 104/38, pulse 66, temperature 97.6 F (36.4 C), temperature source Oral, resp. rate 14, height 5\' 11"  (1.803 m), weight 100.7 kg, SpO2 99 %.        Intake/Output Summary (Last 24 hours) at 05/20/2021 2323 Last data filed at 05/20/2021 2048 Gross per 24 hour  Intake 4569.25 ml  Output 272 ml  Net 4297.25 ml   Filed Weights   05/20/21 1023  Weight: 100.7 kg    Examination: General: In moderate to severe acute distress from and pain and shortness of breath disorientation Neuro: Seem to be intact HEENT:  atraumatic , no jaundice , dry mucous membranes  Cardiovascular:  Irregular irregular , ESM 2/6 in the aortic area  Lungs:  CTA bilateral , no wheezing or crackles  Abdomen: Dressing for left laparoscopic robotic colectomy Musculoskeletal:  WNL , normal pulses  Skin:  No rash    Assessment & Plan:    --Severe hypotension could be hemorrhagic distributive septic very unlikely cardiogenic Patient is being resuscitated ordered fourth units of blood FFP switch the IV fluids to bicarb given the  lactic acid of 7.8 added Levophed and will get a check H&H every 6 hours the drop is concerning for an acute bleed but may be it stopped no evidence of active bleed at this point that reported the blood loss in the OR was minimal.   -- No urine output anuria bladder ultrasound negative once we get the blood pressure up consider giving a dose of Lasix ordered   -- Severe lactic acidosis with hypoperfusion and drop in hemoglobin resuscitation process started   -- Hold all anticoagulation antiplatelets ACE ARB will blood pressure medications were DC'd I also DC the gabapentin given the acute renal insufficiency critical care time spent the care of this patient 63 minutes.  Labs   CBC: Recent Labs  Lab 05/20/21 1811 05/20/21 1944 05/20/21 2009  WBC  --   --  12.9*  HGB 7.8* 9.2* 9.5*  HCT 23.0* 27.0* 29.1*  MCV  --   --  92.1  PLT  --   --  128*    Basic Metabolic Panel: Recent Labs  Lab 05/20/21 1811 05/20/21 1944 05/20/21 2145  NA 138 139 134*  K 3.7 3.6 3.8  CL  --   --  110  CO2  --   --  14*  GLUCOSE  --   --  278*  BUN  --   --  19  CREATININE  --   --  1.85*  CALCIUM  --   --  8.1*  MG  --   --  1.6*  GFR: Estimated Creatinine Clearance: 38.5 mL/min (A) (by C-G formula based on SCr of 1.85 mg/dL (H)). Recent Labs  Lab 05/20/21 2009 05/20/21 2146  WBC 12.9*  --   LATICACIDVEN  --  7.6*    Liver Function Tests: No results for input(s): AST, ALT, ALKPHOS, BILITOT, PROT, ALBUMIN in the last 168 hours. No results for input(s): LIPASE, AMYLASE in the last 168 hours. No results for input(s): AMMONIA in the last 168 hours.  ABG    Component Value Date/Time   PHART 7.257 (L) 05/20/2021 1944   PCO2ART 35.2 05/20/2021 1944   PO2ART 166 (H) 05/20/2021 1944   HCO3 15.7 (L) 05/20/2021 1944   TCO2 17 (L) 05/20/2021 1944   ACIDBASEDEF 11.0 (H) 05/20/2021 1944   O2SAT 99 05/20/2021 1944     Coagulation Profile: Recent Labs  Lab 05/20/21 1545 05/20/21 2009   INR 1.2 1.4*    Cardiac Enzymes: No results for input(s): CKTOTAL, CKMB, CKMBINDEX, TROPONINI in the last 168 hours.  HbA1C: Hgb A1c MFr Bld  Date/Time Value Ref Range Status  05/05/2021 10:43 AM 6.5 (H) 4.8 - 5.6 % Final    Comment:    (NOTE) Pre diabetes:          5.7%-6.4%  Diabetes:              >6.4%  Glycemic control for   <7.0% adults with diabetes   12/31/2017 11:15 PM 7.5 (H) 4.8 - 5.6 % Final    Comment:    (NOTE) Pre diabetes:          5.7%-6.4% Diabetes:              >6.4% Glycemic control for   <7.0% adults with diabetes     CBG: Recent Labs  Lab 05/20/21 1101 05/20/21 1527  GLUCAP 150* 190*    Review of Systems:   Unable to obtain due to patient condition Past Medical History:  He,  has a past medical history of Anemia, Arthritis, CAD (coronary artery disease), Diabetes mellitus without complication ([place]), GERD (gastroesophageal reflux disease), Glaucoma, Gout, Headache, History of kidney stones, Hypertension, NSTEMI (non-ST elevated myocardial infarction) ([place]) (12/31/2017), Paroxysmal atrial fibrillation ([place]), Pericardial effusion, Prostate cancer ([place]) (2007), Supraventricular tachycardia ([place]), and Temporal arteritis ([place]).   Surgical History:   Past Surgical History:  Procedure Laterality Date   BIOPSY  10/12/2020   Procedure: BIOPSY;  Surgeon: [name], MD;  [facility];  Service: Gastroenterology;;   BIOPSY  02/25/2021   Procedure: BIOPSY;  Surgeon: [name], MD;  [facility];  Service: Gastroenterology;;   BIOPSY  04/11/2021   Procedure: BIOPSY;  Surgeon: Irving Copas., MD;  Location: [facility];  Service: Gastroenterology;;   CATARACT EXTRACTION W/PHACO Left 06/07/2015   Procedure: CATARACT EXTRACTION PHACO AND INTRAOCULAR LENS PLACEMENT LEFT EYE CDE=8.00;  Surgeon: Tonny Branch, MD;  Location: [facility];  Service: Ophthalmology;  Laterality: Left;   CATARACT EXTRACTION  W/PHACO Right 06/17/2015   Procedure: CATARACT EXTRACTION PHACO AND INTRAOCULAR LENS PLACEMENT RIGHT EYE CDE=11.09;  Surgeon: Tonny Branch, MD;  Location: [facility];  Service: Ophthalmology;  Laterality: Right;   COLONOSCOPY WITH PROPOFOL N/A 10/12/2020   Procedure: COLONOSCOPY WITH PROPOFOL;  Surgeon: [name], MD;  [facility];  Service: Gastroenterology;  Laterality: N/A;  10:35   COLONOSCOPY WITH PROPOFOL N/A 02/25/2021   Procedure: COLONOSCOPY WITH PROPOFOL;  Surgeon: [name], MD;  [facility];  Service: Gastroenterology;  Laterality:  N/A;  8:10   CORONARY ANGIOPLASTY WITH STENT PLACEMENT  01/01/2018   CORONARY STENT INTERVENTION N/A 01/01/2018   Procedure: CORONARY STENT INTERVENTION;  Surgeon: Jettie Booze, MD;  Location: [facility];  Service: Cardiovascular;  Laterality: N/A;   ENDOSCOPIC MUCOSAL RESECTION N/A 04/11/2021   Procedure: ENDOSCOPIC MUCOSAL RESECTION;  Surgeon: Rush Landmark Telford Nab., MD;  Location: [facility];  Service: Gastroenterology;  Laterality: N/A;   ENTEROSCOPY N/A 10/12/2020   Procedure: PUSH ENTEROSCOPY;  Surgeon: [name], MD;  [facility];  Service: Gastroenterology;  Laterality: N/A;   ENTEROSCOPY N/A 04/11/2021   Procedure: ENTEROSCOPY;  Surgeon: Rush Landmark Telford Nab., MD;  Location: [facility];  Service: Gastroenterology;  Laterality: N/A;   ESOPHAGOGASTRODUODENOSCOPY (EGD) WITH PROPOFOL N/A 10/12/2020   Procedure: ESOPHAGOGASTRODUODENOSCOPY (EGD) WITH PROPOFOL;  Surgeon: [name], MD;  [facility];  Service: Gastroenterology;  Laterality: N/A;   FRACTURE SURGERY Left 2010   ankle   HEMOSTASIS CLIP PLACEMENT  04/11/2021   Procedure: HEMOSTASIS CLIP PLACEMENT;  Surgeon: Irving Copas., MD;  Location: [facility];  Service: Gastroenterology;;   INSERTION PROSTATE RADIATION SEED  2007   KNEE ARTHROSCOPY Left    LEFT HEART  CATH AND CORONARY ANGIOGRAPHY N/A 01/01/2018   Procedure: LEFT HEART CATH AND CORONARY ANGIOGRAPHY;  Surgeon: Jettie Booze, MD;  Location: [facility];  Service: Cardiovascular;  Laterality: N/A;   ORIF TIBIA & FIBULA FRACTURES Left 2003   Distal tibial/fibula    PERICARDIAL WINDOW  02/2007   PERICARDIOCENTESIS  2007   hx/notes 10/19/2011   POLYPECTOMY  10/12/2020   Procedure: POLYPECTOMY;  Surgeon: [name], MD;  [facility];  Service: Gastroenterology;;  small bowel, cecal   POLYPECTOMY  02/25/2021   Procedure: POLYPECTOMY;  Surgeon: [name], MD;  [facility];  Service: Gastroenterology;;  transverse colon x2   SUBMUCOSAL LIFTING INJECTION  04/11/2021   Procedure: SUBMUCOSAL LIFTING INJECTION;  Surgeon: Irving Copas., MD;  Location: [facility];  Service: Gastroenterology;;   SUBMUCOSAL TATTOO INJECTION  04/11/2021   Procedure: SUBMUCOSAL TATTOO INJECTION;  Surgeon: Irving Copas., MD;  Location: [facility];  Service: Gastroenterology;;     Social History:   reports that he quit smoking about 55 years ago. His smoking use included cigarettes. He started smoking about 62 years ago. He has a 3.00 pack-year smoking history. He has never used smokeless tobacco. He reports that he does not currently use alcohol. He reports that he does not use drugs.   Family History:  His family history includes CAD in his mother; Lung cancer in his brother; Prostate cancer in his brother and father.   Allergies Allergies  Allergen Reactions   Amiodarone Rash     Home Medications  Prior to Admission medications   Medication Sig Start Date End Date Taking? Authorizing Provider  allopurinol (ZYLOPRIM) 300 MG tablet Take 450 mg by mouth daily.    Yes [provider]  apixaban (ELIQUIS) 5 MG TABS tablet Take 1 tablet (5 mg total) by mouth 2 (two) times daily. 04/13/21  Yes Mansouraty, Telford Nab., MD   atorvastatin (LIPITOR) 80 MG tablet TAKE 1 TABLET BY MOUTH ONCE DAILY AT  6  PM 03/25/21  Yes Satira Sark, MD  benazepril (LOTENSIN) 40 MG tablet Take 1 tablet by mouth once daily 03/25/21  Yes Satira Sark, MD  diltiazem (CARDIZEM CD) 180 MG 24 hr capsule Take 1 capsule (180 mg  total) by mouth daily. 07/31/18  Yes Tat, Shanon Brow, MD  dorzolamide (TRUSOPT) 2 % ophthalmic solution Place 1 drop into both eyes 2 (two) times daily. 03/30/21  Yes [provider]  FERREX 150 150 MG capsule Take 150 mg by mouth 2 (two) times daily. 01/23/20  Yes [provider]  hydrALAZINE (APRESOLINE) 100 MG tablet Take 100 mg by mouth 2 (two) times daily.   Yes [provider]  hydrochlorothiazide (HYDRODIURIL) 12.5 MG tablet Take 12.5 mg by mouth daily. 01/23/20  Yes [provider]  latanoprost (XALATAN) 0.005 % ophthalmic solution Place 1 drop into both eyes at bedtime. 01/22/20  Yes [provider]  metFORMIN (GLUCOPHAGE-XR) 500 MG 24 hr tablet Take 500 mg by mouth 2 (two) times daily. 12/06/20  Yes [provider]  metoprolol succinate (TOPROL-XL) 50 MG 24 hr tablet Take 50 mg by mouth daily. Take with or immediately following a meal.   Yes [provider]  Multiple Vitamin (MULTIVITAMIN) tablet Take 1 tablet by mouth in the morning.   Yes [provider]  pantoprazole (PROTONIX) 40 MG tablet Take 1 tablet (40 mg total) by mouth 2 (two) times daily before a meal. 04/11/21  Yes Mansouraty, Telford Nab., MD  vitamin C (ASCORBIC ACID) 500 MG tablet Take 500 mg by mouth 2 (two) times daily.    Yes [provider]     Critical care time: Critical care time spent in care of this patient was 68 minutes

## 2021-05-20 NOTE — Progress Notes (Addendum)
eLink Physician-Brief Progress Note Patient Name: [name] [name] DOB: [birth year] MRN: [id number]   Date of Service  05/20/2021  HPI/Events of Note  80/M with colon CA, DM who is s/p robotic right colectomy, transferred to the ICU.  Pt was hypotensive on transport and arrival in the ICU.   SBP in the 60s.   eICU Interventions  Restart on neosynephrine peripherally.  Give 1L NS bolus.  Check CBC, BMP, Mg, phos, lactate.  Peri-operative antibiotics as per surgery. Pain control. SCDs for DVT prophylaxis.      Intervention Category Major Interventions: Hypotension - evaluation and management  Elsie Lincoln 05/20/2021, 9:42 PM  10:55 PM Mg noted at 1.6, K 3.8, crea 1.85. Ionized calcium 1.1.  Plan> Replete Mg.   1:04 AM RN reports temp of 96.5.  Plan> Ok to apply Retail banker.  3:08 AM Received query from bedside RN regarding pressors.  Pt remains on levophed and neosynephrine.  Pt is getting HCO3 gtt at 125cc/hr.  Pt received pRBCs and plasma. Pt however with no urine output.  Plan> Would hold off on giving more fluids. Continue with pressors.  Titrate when able.   6:50 AM ABG 7.14/32/89 on NaHCO3 gtt infusing at 125cc/hr.  Pt is awake on camera assessment.   Plan> Give NaHCO3 128meq IV push.  Increase NaHCO3 to 150cc/hr.

## 2021-05-20 NOTE — Transfer of Care (Signed)
Immediate Anesthesia Transfer of Care Note  Patient: [name] [name]  Procedure(s) Performed: XI ROBOT ASSISTED LAPAROSCOPIC EXTENDED RIGHT COLECTOMY  Patient Location: PACU  Anesthesia Type:General  Level of Consciousness: awake, alert , oriented and patient cooperative  Airway & Oxygen Therapy: Patient Spontanous Breathing and Patient connected to face mask oxygen  Post-op Assessment: Report given to RN, Post -op Vital signs reviewed and stable and Patient moving all extremities X 4  Post vital signs: stable  Last Vitals:  Vitals Value Taken Time  BP 128/50 05/20/21 1530  Temp 36.3 C 05/20/21 1521  Pulse 50 05/20/21 1531  Resp 11 05/20/21 1531  SpO2 98 % 05/20/21 1531  Vitals shown include unvalidated device data.  Last Pain:  Vitals:   05/20/21 1521  PainSc: 0-No pain         Complications: No notable events documented.

## 2021-05-20 NOTE — Op Note (Signed)
05/20/2021  3:07 PM  PATIENT:  [name] [name]  81 y.o. male  Patient Care Team: Caryl Bis, MD as PCP - General (Unknown Physician Specialty) Satira Sark, MD as PCP - Cardiology (Cardiology) Derek Jack, MD as Medical Oncologist (Medical Oncology)  PRE-OPERATIVE DIAGNOSIS:  colon cancer  POST-OPERATIVE DIAGNOSIS:  colon cancer  PROCEDURE:  XI ROBOT ASSISTED EXTENDED RIGHT COLECTOMY  :  Surgeon(s): Leighton Ruff, MD Kinsinger, Arta Bruce, MD  ASSISTANT: Dr Kieth Brightly   ANESTHESIA:   local and general  EBL:25ml  Total I/O In: 1100 [I.V.:1000; IV Piggyback:100] Out: 250 [Urine:200; Blood:50]  Delay start of Pharmacological VTE agent (>24hrs) due to surgical blood loss or risk of bleeding:  no  DRAINS: none   SPECIMEN:  Source of Specimen:  ascending and transverse colon  DISPOSITION OF SPECIMEN:  PATHOLOGY  COUNTS:  YES  PLAN OF CARE: Admit to inpatient   PATIENT DISPOSITION:  PACU - hemodynamically stable.  INDICATION:    81 y.o. M with descending colon cancer, tattooed.  I recommended segmental resection:  The anatomy & physiology of the digestive tract was discussed.  The pathophysiology was discussed.  Natural history risks without surgery was discussed.   I worked to give an overview of the disease and the frequent need to have multispecialty involvement.  I feel the risks of no intervention will lead to serious problems that outweigh the operative risks; therefore, I recommended a partial colectomy to remove the pathology.  Laparoscopic & open techniques were discussed.   Risks such as bleeding, infection, abscess, leak, reoperation, possible ostomy, hernia, heart attack, death, and other risks were discussed.  I noted a good likelihood this will help address the problem.   Goals of post-operative recovery were discussed as well.    The patient expressed understanding & wished to proceed with surgery.  OR FINDINGS:   Patient had tattoo noted  in the mid transverse colon  No obvious metastatic disease on visceral parietal peritoneum or liver.  DESCRIPTION:   Informed consent was confirmed.  The patient underwent general anaesthesia without difficulty.  The patient was positioned appropriately.  VTE prevention in place.  The patient's abdomen was clipped, prepped, & draped in a sterile fashion.  Surgical timeout confirmed our plan.  The patient was positioned in reverse Trendelenburg.  Abdominal entry was gained using a Varies needle in the LUQ.  Entry was clean.  I induced carbon dioxide insufflation.  An 13mm robotic port was placed in the RUQ.  Camera inspection revealed no injury.  Extra ports were carefully placed under direct laparoscopic visualization.  I laparoscopically reflected the greater omentum and the upper abdomen the small bowel in the upper abdomen.  I evaluated the abdomen for tattoo noted in the colon.  There was none noted in the rectum or sigmoid.  There was none noted in the descending colon.  We brought out a colonoscope and evaluated the entire internal colon to the level of the splenic flexure.  No tattoo or masses were located.  At this point we reevaluated the abdomen laparoscopically and looked in the transverse colon.  The tattoo was then located in the mid transverse colon.  Given this location, I decided to perform an extended right colectomy.  Additional ports were placed in the left lower quadrant and a 12 mm suprapubic port was placed.  The patient was appropriately positioned and the robot was docked to the patient's right side.  Instruments were placed under direct visualization.   I  began by identifying the ileocolic artery and vein within the mesentery. Dissection was bluntly carried around these structures. The duodenum was identified and free from the structures. I then separated the structures bluntly and used the robotic vessel sealer device to transect these.  I developed the retroperitoneal plane  bluntly.  I then freed the appendix off its attachments to the pelvic wall. I mobilized the terminal ileum.  I took care to avoid injuring any retroperitoneal structures.  After this I began to mobilize laterally down the white line of [place] and then took down the hepatic flexure using the Enseal device. I mobilized the omentum off of the right transverse colon. The entire colon was then flipped medially and mobilized off of the retroperitoneal structures until I could visualize the lateral edge of the duodenum underneath.  I gently freed the duodenal attachments.  I continue my mesenteric dissection across the right branch and middle colic branch.  I then brought this mesenteric dissection up to the transverse colon just distal to the tattoo.  We injected with firefly to evaluate perfusion of the transverse colon.   I then divided the terminal ileal mesentery with the robotic vessel sealer.  At that point, the terminal ileum was divided with a blue load robotic 60 mm stapler.  The transverse colon was divided with 2 green load robotic stapler fires.  The specimen was then completely free and placed in the right upper quadrant.  Hemostasis was good.  I then oriented the remaining terminal ileum and transverse colon and a isoperistaltic fashion.  I placed an enterotomy in the small bowel and colon using the robotic scissors.  I then introduced a white load 60 mm robotic stapler into both enterotomies and created an anastomosis between the small bowel and transverse colon.  Hemostasis within the staple line was good.  The common enterotomy channel was closed using 2 running 2-0 V-Loc sutures.  The abdomen was then irrigated with normal saline. The omentum was then brought down over the anastomosis.  At this point the robot was undocked.  The 12 mm suprapubic port was enlarged to a Pfannenstiel incision and an [place] wound protector was placed.  The specimen was removed from the abdomen and evaluated.  Once the  abdomen was inspected for hemostasis, the [place] wound protector was removed.    The peritoneum of the Pfannenstiel incision was closed using a running 0 Vicryl suture.  The fascia was then closed using #1 Novafil interrupted sutures.  The subcutaneous tissue of the extraction incision was closed using a running 2-0 Vicryl suture. The skin was then closed using running subcuticular 4-0 Vicryl sutures.  A sterile dressing was applied.  The remaining port sites were closed using interrupted 4-0 Vicryl sutures and Dermabond. All counts were correct per operating room staff. The patient was then awakened from anesthesia and sent to the post anesthesia care unit in stable condition.     An MD assistant was necessary for tissue manipulation, retraction and positioning due to the complexity of the case and hospital policies

## 2021-05-20 NOTE — Anesthesia Postprocedure Evaluation (Signed)
Anesthesia Post Note  Patient: [name] [name]  Procedure(s) Performed: XI ROBOT ASSISTED LAPAROSCOPIC EXTENDED RIGHT COLECTOMY     Patient location during evaluation: PACU Anesthesia Type: General Level of consciousness: awake and alert Pain management: pain level controlled Vital Signs Assessment: vitals unstable Respiratory status: spontaneous breathing, nonlabored ventilation and respiratory function stable Cardiovascular status: unstable Postop Assessment: no apparent nausea or vomiting Anesthetic complications: no Comments:   Patient arrived to PACU in stable condition. He recovered fully from anesthesia as expected and has remained awake and alert. His blood pressure remained normal for his first hour in PACU. After an hour he began to develop progressive hypotension which did not respond to fluid boluses. He received a total of 500 mL 5% albumin and 1.5 liters of LR in PACU. His hypotension was worsening despite this treatment so labs were drawn which were remarkable for a Hgb of 7.8 (down from 12.8 preop). We elected to transfuse 1 unit of blood and began a phenylephrine drip in PACU. The on call surgeon was called to evaluate for possible bleeding.   No notable events documented.  Last Vitals:  Vitals:   05/20/21 1900 05/20/21 1903  BP: (!) 100/40 (!) 92/42  Pulse: 61 66  Resp: 17 15  Temp:  (!) 35.4 C  SpO2: 100% 100%    Last Pain:  Vitals:   05/20/21 1900  TempSrc:   PainSc: 0-No pain                 Lidia Collum

## 2021-05-21 ENCOUNTER — Inpatient Hospital Stay (HOSPITAL_COMMUNITY): Payer: Medicare HMO

## 2021-05-21 ENCOUNTER — Encounter (HOSPITAL_COMMUNITY): Payer: Self-pay | Admitting: General Surgery

## 2021-05-21 DIAGNOSIS — M79605 Pain in left leg: Secondary | ICD-10-CM

## 2021-05-21 DIAGNOSIS — T8111XA Postprocedural  cardiogenic shock, initial encounter: Secondary | ICD-10-CM | POA: Diagnosis not present

## 2021-05-21 DIAGNOSIS — R578 Other shock: Secondary | ICD-10-CM | POA: Diagnosis not present

## 2021-05-21 DIAGNOSIS — N179 Acute kidney failure, unspecified: Secondary | ICD-10-CM

## 2021-05-21 DIAGNOSIS — R571 Hypovolemic shock: Secondary | ICD-10-CM | POA: Diagnosis not present

## 2021-05-21 DIAGNOSIS — T8119XA Other postprocedural shock, initial encounter: Secondary | ICD-10-CM

## 2021-05-21 DIAGNOSIS — M79604 Pain in right leg: Secondary | ICD-10-CM | POA: Diagnosis not present

## 2021-05-21 DIAGNOSIS — I9589 Other hypotension: Secondary | ICD-10-CM | POA: Diagnosis not present

## 2021-05-21 LAB — BASIC METABOLIC PANEL
Anion gap: 18 — ABNORMAL HIGH (ref 5–15)
Anion gap: 12 (ref 5–15)
BUN: 21 mg/dL (ref 8–23)
BUN: 32 mg/dL — ABNORMAL HIGH (ref 8–23)
CO2: 11 mmol/L — ABNORMAL LOW (ref 22–32)
CO2: 19 mmol/L — ABNORMAL LOW (ref 22–32)
Calcium: 7.9 mg/dL — ABNORMAL LOW (ref 8.9–10.3)
Calcium: 7.3 mg/dL — ABNORMAL LOW (ref 8.9–10.3)
Chloride: 103 mmol/L (ref 98–111)
Chloride: 99 mmol/L (ref 98–111)
Creatinine, Ser: 2.44 mg/dL — ABNORMAL HIGH (ref 0.61–1.24)
Creatinine, Ser: 2.8 mg/dL — ABNORMAL HIGH (ref 0.61–1.24)
GFR, Estimated: 26 mL/min — ABNORMAL LOW (ref >60)
GFR, Estimated: 22 mL/min — ABNORMAL LOW (ref >60)
Glucose, Bld: 288 mg/dL — ABNORMAL HIGH (ref 70–99)
Glucose, Bld: 291 mg/dL — ABNORMAL HIGH (ref 70–99)
Potassium: 4.2 mmol/L (ref 3.5–5.1)
Potassium: 3.9 mmol/L (ref 3.5–5.1)
Sodium: 132 mmol/L — ABNORMAL LOW (ref 135–145)
Sodium: 130 mmol/L — ABNORMAL LOW (ref 135–145)

## 2021-05-21 LAB — BLOOD GAS, ARTERIAL
Acid-base deficit: 17 mmol/L — ABNORMAL HIGH (ref 0.0–2.0)
Acid-base deficit: 15.3 mmol/L — ABNORMAL HIGH (ref 0.0–2.0)
Acid-base deficit: 9.8 mmol/L — ABNORMAL HIGH (ref 0.0–2.0)
Acid-base deficit: 7.2 mmol/L — ABNORMAL HIGH (ref 0.0–2.0)
Bicarbonate: 10.9 mmol/L — ABNORMAL LOW (ref 20.0–28.0)
Bicarbonate: 11.9 mmol/L — ABNORMAL LOW (ref 20.0–28.0)
Bicarbonate: 15.9 mmol/L — ABNORMAL LOW (ref 20.0–28.0)
Bicarbonate: 17.8 mmol/L — ABNORMAL LOW (ref 20.0–28.0)
FIO2: 40 %
FIO2: 32 %
FIO2: 28 %
O2 Saturation: 97.6 %
O2 Saturation: 98.2 %
O2 Saturation: 98.2 %
O2 Saturation: 98.2 %
Patient temperature: 36.8
Patient temperature: 36.4
Patient temperature: 37
Patient temperature: 37
pCO2 arterial: 32 mmHg (ref 32–48)
pCO2 arterial: 31 mmHg — ABNORMAL LOW (ref 32–48)
pCO2 arterial: 33 mmHg (ref 32–48)
pCO2 arterial: 33 mmHg (ref 32–48)
pH, Arterial: 7.14 — CL (ref 7.35–7.45)
pH, Arterial: 7.19 — CL (ref 7.35–7.45)
pH, Arterial: 7.29 — ABNORMAL LOW (ref 7.35–7.45)
pH, Arterial: 7.34 — ABNORMAL LOW (ref 7.35–7.45)
pO2, Arterial: 89 mmHg (ref 83–108)
pO2, Arterial: 96 mmHg (ref 83–108)
pO2, Arterial: 101 mmHg (ref 83–108)
pO2, Arterial: 111 mmHg — ABNORMAL HIGH (ref 83–108)

## 2021-05-21 LAB — LACTIC ACID, PLASMA
Lactic Acid, Venous: >9 mmol/L (ref 0.5–1.9)
Lactic Acid, Venous: 8.8 mmol/L (ref 0.5–1.9)
Lactic Acid, Venous: 7.5 mmol/L (ref 0.5–1.9)
Lactic Acid, Venous: 5.6 mmol/L (ref 0.5–1.9)

## 2021-05-21 LAB — TROPONIN I (HIGH SENSITIVITY)
Troponin I (High Sensitivity): 763 ng/L (ref <18)
Troponin I (High Sensitivity): 708 ng/L (ref <18)

## 2021-05-21 LAB — ECHOCARDIOGRAM COMPLETE
AR max vel: 2.52 cm2
AV Area VTI: 3.17 cm2
AV Area mean vel: 2.76 cm2
AV Mean grad: 9 mmHg
AV Peak grad: 17.6 mmHg
Ao pk vel: 2.1 m/s
Area-P 1/2: 1.68 cm2
Height: 71 in
S' Lateral: 1.6 cm
Weight: 3714.31 oz

## 2021-05-21 LAB — COOXEMETRY PANEL
Carboxyhemoglobin: 0.4 % — ABNORMAL LOW (ref 0.5–1.5)
Methemoglobin: 1.7 % — ABNORMAL HIGH (ref 0.0–1.5)
O2 Saturation: 80.3 %
Total hemoglobin: 9.9 g/dL — ABNORMAL LOW (ref 12.0–16.0)

## 2021-05-21 LAB — CBC
HCT: 32.1 % — ABNORMAL LOW (ref 39.0–52.0)
Hemoglobin: 10.5 g/dL — ABNORMAL LOW (ref 13.0–17.0)
MCH: 30.3 pg (ref 26.0–34.0)
MCHC: 32.7 g/dL (ref 30.0–36.0)
MCV: 92.8 fL (ref 80.0–100.0)
Platelets: 116 10*3/uL — ABNORMAL LOW (ref 150–400)
RBC: 3.46 MIL/uL — ABNORMAL LOW (ref 4.22–5.81)
RDW: 16.5 % — ABNORMAL HIGH (ref 11.5–15.5)
WBC: 26.4 10*3/uL — ABNORMAL HIGH (ref 4.0–10.5)
nRBC: 0.1 % (ref 0.0–0.2)

## 2021-05-21 LAB — HEMOGLOBIN AND HEMATOCRIT, BLOOD
HCT: 32.1 % — ABNORMAL LOW (ref 39.0–52.0)
HCT: 27.6 % — ABNORMAL LOW (ref 39.0–52.0)
Hemoglobin: 10.6 g/dL — ABNORMAL LOW (ref 13.0–17.0)
Hemoglobin: 9.4 g/dL — ABNORMAL LOW (ref 13.0–17.0)

## 2021-05-21 LAB — PHOSPHORUS: Phosphorus: 9 mg/dL — ABNORMAL HIGH (ref 2.5–4.6)

## 2021-05-21 LAB — GLUCOSE, CAPILLARY
Glucose-Capillary: 172 mg/dL — ABNORMAL HIGH (ref 70–99)
Glucose-Capillary: 244 mg/dL — ABNORMAL HIGH (ref 70–99)
Glucose-Capillary: 255 mg/dL — ABNORMAL HIGH (ref 70–99)
Glucose-Capillary: 256 mg/dL — ABNORMAL HIGH (ref 70–99)
Glucose-Capillary: 260 mg/dL — ABNORMAL HIGH (ref 70–99)

## 2021-05-21 MED ORDER — PHENOL 1.4 % MT LIQD
1.0000 | OROMUCOSAL | Status: DC | PRN
  Administered 2021-05-21: 1 via OROMUCOSAL
  Filled 2021-05-21: qty 177

## 2021-05-21 MED ORDER — PHENYLEPHRINE CONCENTRATED 100MG/250ML (0.4 MG/ML) INFUSION SIMPLE
25.0000 ug/min | INTRAVENOUS | Status: DC
  Administered 2021-05-21: 30 ug/min via INTRAVENOUS
  Filled 2021-05-21: qty 250

## 2021-05-21 MED ORDER — NOREPINEPHRINE 16 MG/250ML-% IV SOLN
0.0000 ug/min | INTRAVENOUS | Status: DC
  Administered 2021-05-21: 27 ug/min via INTRAVENOUS
  Filled 2021-05-21: qty 250

## 2021-05-21 MED ORDER — SODIUM BICARBONATE 8.4 % IV SOLN
100.0000 meq | Freq: Once | INTRAVENOUS | Status: AC
  Administered 2021-05-21: 100 meq via INTRAVENOUS
  Filled 2021-05-21: qty 50

## 2021-05-21 MED ORDER — PANTOPRAZOLE SODIUM 40 MG IV SOLR
40.0000 mg | INTRAVENOUS | Status: DC
  Administered 2021-05-22 – 2021-05-30 (×9): 40 mg via INTRAVENOUS
  Filled 2021-05-21 (×9): qty 10

## 2021-05-21 MED ORDER — INSULIN ASPART 100 UNIT/ML IJ SOLN
0.0000 [IU] | INTRAMUSCULAR | Status: DC
  Administered 2021-05-21: 09:00:00 8 [IU] via SUBCUTANEOUS
  Administered 2021-05-21: 3 [IU] via SUBCUTANEOUS
  Administered 2021-05-21: 8 [IU] via SUBCUTANEOUS
  Administered 2021-05-21: 12:00:00 5 [IU] via SUBCUTANEOUS
  Administered 2021-05-21: 20:00:00 8 [IU] via SUBCUTANEOUS
  Administered 2021-05-22: 3 [IU] via SUBCUTANEOUS
  Administered 2021-05-22: 5 [IU] via SUBCUTANEOUS
  Administered 2021-05-22: 3 [IU] via SUBCUTANEOUS
  Administered 2021-05-22: 5 [IU] via SUBCUTANEOUS
  Administered 2021-05-22: 2 [IU] via SUBCUTANEOUS
  Administered 2021-05-23: 5 [IU] via SUBCUTANEOUS
  Administered 2021-05-23: 3 [IU] via SUBCUTANEOUS
  Administered 2021-05-23: 2 [IU] via SUBCUTANEOUS
  Administered 2021-05-23 (×2): 3 [IU] via SUBCUTANEOUS
  Administered 2021-05-24: 2 [IU] via SUBCUTANEOUS
  Administered 2021-05-24 (×2): 3 [IU] via SUBCUTANEOUS
  Administered 2021-05-24: 2 [IU] via SUBCUTANEOUS
  Administered 2021-05-24: 3 [IU] via SUBCUTANEOUS
  Administered 2021-05-25 (×2): 2 [IU] via SUBCUTANEOUS

## 2021-05-21 MED ORDER — PERFLUTREN LIPID MICROSPHERE
1.0000 mL | INTRAVENOUS | Status: AC | PRN
  Administered 2021-05-21: 4 mL via INTRAVENOUS
  Filled 2021-05-21: qty 10

## 2021-05-21 NOTE — Progress Notes (Signed)
NAME:  [name] [name], MRN:  [id number], DOB:  Aug 23, 1940, LOS: 1 ADMISSION DATE:  05/20/2021, CONSULTATION DATE: May 20, 2021 REFERRING MD: General surgery, CHIEF COMPLAINT: Postop hypertension History of Present Illness:   Patient is 81 year old Caucasian male , had robotic transverse right-sided colectomy 2/24 came out of the OR started going hypotensive H&H check showed that that he dropped from around 12-7.8 was brought down to the ICU for pressors use we were consulted for persistent hypotension surgeon was caring for him bedside the surgeon does not think that the patient is actively bleeding at the moment nursing is not reporting any bleeding per rectum   PMH: coronary artery disease  A-fib all anticoagulation antiplatelets have been stopped for few days.   Significant tests/ events reviewed    SUBJ : Fluid resuscitated overnight, transfused 4 units PRBC Remains critically ill, on Levophed 27 mics and phenylephrine 150 mics Poor urine output. On 5 L nasal cannula,   Objective   Blood pressure (!) 112/46, pulse 87, temperature 97.6 F (36.4 C), temperature source Axillary, resp. rate (!) 9, height 5\' 11"  (1.803 m), weight 105.3 kg, SpO2 93 %.        Intake/Output Summary (Last 24 hours) at 05/21/2021 0801 Last data filed at 05/21/2021 0734 Gross per 24 hour  Intake 9462.15 ml  Output 312 ml  Net 9150.15 ml    Filed Weights   05/20/21 1023 05/21/21 0600  Weight: 100.7 kg 105.3 kg    Examination: General: Appears younger than stated age, pain better controlled, no distress Neuro: Alert oriented x3, grossly nonfocal  HEENT:  atraumatic , no jaundice , dry mucous membranes  Cardiovascular:  Irregular irregular , ESM 2/6 in the aortic area  Lungs: No accessory muscle use, clear lungs Abdomen: Dressing intact, soft, no tenderness, no guarding or rigidity Musculoskeletal:  WNL , normal pulses  Skin: Ecchymosis over right flank  Chest x-ray independently reviewed  shows new central line in position, no effusions or infiltrates.  Labs show rising creatinine from 1.8-2.4, mild hyponatremia, increase leukocytosis, increased lactic acidosis. ABG shows metabolic acidosis consistent with lactate  Assessment & Plan:    Severe shock with lactic acidosis  -could be hemorrhagic , but hemoglobin has come up  -distributive septic very unlikely,  cardiogenic possible, will check enzymes and echo, previous echo 02/2019 showed normal LVEF -Evidence of active bleeding, concern for dead bowel with extreme lactic acidosis has been discussed with surgeon, benign abdominal exam noted and they do not feel that abdominal exploration is necessary at this time, can consider CT imaging -We will track lactate -Monitor hemoglobin every 6  AKI with metabolic/lactic acidosis --Monitor urine output and renal function -Monitor CVP and keep about 10   Diabetes type 2 -Hold metformin -Add SSI  CAD, hypertension Paroxysmal atrial fibrillation  -Holding apixaban, Cardizem, metoprolol, benazepril -EKGs independently reviewed showed Q waves in inferior leads which are old, no new ST-T wave changes, check troponins and echo   Best practice DVT prophylaxis -SCDs, hold Lovenox Goals of care: Full CODE STATUS currently  Labs   CBC: Recent Labs  Lab 05/20/21 1811 05/20/21 1944 05/20/21 2009 05/21/21 0417 05/21/21 0418  WBC  --   --  12.9*  --  26.4*  HGB 7.8* 9.2* 9.5* 10.6* 10.5*  HCT 23.0* 27.0* 29.1* 32.1* 32.1*  MCV  --   --  92.1  --  92.8  PLT  --   --  128*  --  116*  Basic Metabolic Panel: Recent Labs  Lab 05/20/21 1811 05/20/21 1944 05/20/21 2145 05/21/21 0417 05/21/21 0418  NA 138 139 134*  --  132*  K 3.7 3.6 3.8  --  4.2  CL  --   --  110  --  103  CO2  --   --  14*  --  11*  GLUCOSE  --   --  278*  --  288*  BUN  --   --  19  --  21  CREATININE  --   --  1.85*  --  2.44*  CALCIUM  --   --  8.1*  --  7.9*  MG  --   --  1.6*  --   --    PHOS  --   --   --  9.0*  --     GFR: Estimated Creatinine Clearance: 29.8 mL/min (A) (by C-G formula based on SCr of 2.44 mg/dL (H)). Recent Labs  Lab 05/20/21 2009 05/20/21 2146 05/21/21 0417 05/21/21 0418  WBC 12.9*  --   --  26.4*  LATICACIDVEN  --  7.6* >9.0*  --      Liver Function Tests: No results for input(s): AST, ALT, ALKPHOS, BILITOT, PROT, ALBUMIN in the last 168 hours. No results for input(s): LIPASE, AMYLASE in the last 168 hours. No results for input(s): AMMONIA in the last 168 hours.  ABG    Component Value Date/Time   PHART 7.14 (LL) 05/21/2021 0556   PCO2ART 32 05/21/2021 0556   PO2ART 89 05/21/2021 0556   HCO3 10.9 (L) 05/21/2021 0556   TCO2 17 (L) 05/20/2021 1944   ACIDBASEDEF 17.0 (H) 05/21/2021 0556   O2SAT 97.6 05/21/2021 0556      Coagulation Profile: Recent Labs  Lab 05/20/21 1545 05/20/21 2009  INR 1.2 1.4*     Cardiac Enzymes: No results for input(s): CKTOTAL, CKMB, CKMBINDEX, TROPONINI in the last 168 hours.  HbA1C: Hgb A1c MFr Bld  Date/Time Value Ref Range Status  05/05/2021 10:43 AM 6.5 (H) 4.8 - 5.6 % Final    Comment:    (NOTE) Pre diabetes:          5.7%-6.4%  Diabetes:              >6.4%  Glycemic control for   <7.0% adults with diabetes   12/31/2017 11:15 PM 7.5 (H) 4.8 - 5.6 % Final    Comment:    (NOTE) Pre diabetes:          5.7%-6.4% Diabetes:              >6.4% Glycemic control for   <7.0% adults with diabetes     CBG: Recent Labs  Lab 05/20/21 1101 05/20/21 1527  GLUCAP 150* 190*        The patient is critically ill with multiple organ systems failure and requires high complexity decision making for assessment and support, frequent evaluation and titration of therapies, application of advanced monitoring technologies and extensive interpretation of multiple databases. Critical Care Time devoted to patient care services described in this note independent of APP/resident  time is 50 minutes.     Kara Mead MD. [facility] Flood. [facility] Pager : [phone]  If no response to pager , please call [phone] until 7 pm After 7:00 pm call Elink  [phone]   05/21/2021

## 2021-05-21 NOTE — Progress Notes (Addendum)
PT Cancellation Note  Patient Details Name: [name] [name] MRN: [id number] DOB: 09/19/1940   Cancelled Treatment:    Reason Eval/Treat Not Completed: Medical issues which prohibited therapy-post op medical complications. Will hold PT for now. Will check back another day and await clearance for OOB/work with PT.    Doreatha Massed, PT Acute Rehabilitation  Office: [phone] Pager: [phone]

## 2021-05-21 NOTE — Plan of Care (Signed)
°  Problem: Education: Goal: Knowledge of General Education information will improve Description: Including pain rating scale, medication(s)/side effects and non-pharmacologic comfort measures Outcome: Progressing   Problem: Clinical Measurements: Goal: Respiratory complications will improve Outcome: Not Progressing   Problem: Nutrition: Goal: Adequate nutrition will be maintained Outcome: Not Progressing   Problem: Pain Managment: Goal: General experience of comfort will improve Outcome: Progressing

## 2021-05-21 NOTE — Progress Notes (Signed)
Echocardiogram 2D Echocardiogram has been performed.  Oneal Deputy Shealyn Sean RDCS 05/21/2021, 9:24 AM  Drs. Crenshaw and McDowell notified to read

## 2021-05-21 NOTE — Progress Notes (Addendum)
Assessment & Plan: POD#1 - status post robotic extended right colectomy - Dr. Leighton Ruff Post op hypotension, acute blood loss anemia Acute resuscitation in PACU by anesthesia - Dr. Christella Hartigan Transfuse 4U PRBC - Hgb now 10.5 Admit to ICU - CCM consultation appreciated Continue need for pressor support - levo, neo currently Oliguric post op - may need nephrology consult  ADDENDUM  Discussed case this AM with Dr. Marcello Moores.  Will order non-contrast CT abd/pelvis.  Patient continue to appear well, mentating normally, no complaints.  Discussed with nurse at bedside.        Armandina Gemma, MD       [facility]       Office: [phone]   Chief Complaint: Malignant neoplasm of colon  Subjective: Patient in ICU, family and nurse at bedside.  Responsive, mild incisional pain.  Objective: Vital signs in last 24 hours: Temp:  [95.8 F (35.4 C)-98.1 F (36.7 C)] 97.6 F (36.4 C) (02/25 0400) Pulse Rate:  [50-89] 89 (02/25 0500) Resp:  [9-20] 10 (02/25 0500) BP: (72-152)/(34-79) 112/46 (02/25 0255) SpO2:  [90 %-100 %] 91 % (02/25 0500) Arterial Line BP: (72-125)/(19-47) 101/43 (02/25 0500) Weight:  [100.7 kg-105.3 kg] 105.3 kg (02/25 0600)    Intake/Output from previous day: 02/24 0701 - 02/25 0700 In: 9182.9 [I.V.:5826.2; Blood:2256.8; IV Piggyback:100] Out: 272 [Urine:222; Blood:50] Intake/Output this shift: Total I/O In: 6666.7 [I.V.:3826.2; Blood:1840.5; Other:1000] Out: 22 [Urine:22]  Physical Exam: HEENT - sclerae clear, mucous membranes moist Neck - soft Chest - clear bilaterally Cor - rate controlled Abdomen - soft, mild distension, quiet; wounds dry and intact; mild incisional tenderness Ext - no edema, non-tender Neuro - alert & oriented, no focal deficits  Lab Results:  Recent Labs    05/20/21 2009 05/21/21 0417 05/21/21 0418  WBC 12.9*  --  26.4*  HGB 9.5* 10.6* 10.5*  HCT 29.1* 32.1* 32.1*  PLT 128*  --  116*   BMET Recent Labs     05/20/21 2145 05/21/21 0418  NA 134* 132*  K 3.8 4.2  CL 110 103  CO2 14* 11*  GLUCOSE 278* 288*  BUN 19 21  CREATININE 1.85* 2.44*  CALCIUM 8.1* 7.9*   PT/INR Recent Labs    05/20/21 1545 05/20/21 2009  LABPROT 14.7 17.0*  INR 1.2 1.4*   Comprehensive Metabolic Panel:    Component Value Date/Time   NA 132 (L) 05/21/2021 0418   NA 134 (L) 05/20/2021 2145   K 4.2 05/21/2021 0418   K 3.8 05/20/2021 2145   CL 103 05/21/2021 0418   CL 110 05/20/2021 2145   CO2 11 (L) 05/21/2021 0418   CO2 14 (L) 05/20/2021 2145   BUN 21 05/21/2021 0418   BUN 19 05/20/2021 2145   CREATININE 2.44 (H) 05/21/2021 0418   CREATININE 1.85 (H) 05/20/2021 2145   GLUCOSE 288 (H) 05/21/2021 0418   GLUCOSE 278 (H) 05/20/2021 2145   CALCIUM 7.9 (L) 05/21/2021 0418   CALCIUM 8.1 (L) 05/20/2021 2145   AST 29 03/08/2021 1452   AST 22 07/29/2018 0410   ALT 24 03/08/2021 1452   ALT 22 07/29/2018 0410   ALKPHOS 82 03/08/2021 1452   ALKPHOS 92 07/29/2018 0410   BILITOT 0.5 03/08/2021 1452   BILITOT 0.4 07/29/2018 0410   PROT 6.9 03/08/2021 1452   PROT 6.4 (L) 07/29/2018 0410   ALBUMIN 4.1 03/08/2021 1452   ALBUMIN 3.4 (L) 07/29/2018 0410    Studies/Results: DG Chest 1 View  Result  Date: 05/20/2021 CLINICAL DATA:  Central line placement EXAM: CHEST  1 VIEW COMPARISON:  07/28/2018 FINDINGS: Heart is normal size. Lungs clear. No effusions or pneumothorax. Left subclavian central line in place with the tip at the cavoatrial junction. IMPRESSION: Left central line tip at the cavoatrial junction.  No pneumothorax. Electronically Signed   By: Rolm Baptise M.D.   On: 05/20/2021 23:31      Armandina Gemma 05/21/2021   Patient ID: [name] [name], male   DOB: Jan 31, 1941, 81 y.o.   MRN: [id number]

## 2021-05-21 NOTE — Progress Notes (Signed)
Reassessed patient multiple times since last note. Pressors slowly coming down, now off Neo-Synephrine and on Levophed. Heart rate 70s Pain is better controlled, soft nontender abdomen. Minimal urine output Lactate is starting to trend down, 7.5. Hemoglobin slight drop from 10.4-9.4. pH has improved from 7.14-7.29, will decrease bicarbonate drip to 100 cc an hour Co. ox was 80%. Echo reviewed and discussed with cardiologist normal LV function, mild HOCM physiology. Moderate pericardial effusion but no evidence of tamponade, he gives history of pericardial window in 2008 , he has history of stent to left circumflex and is maintained on apixaban for paroxysmal atrial fibrillation.  Mention made of mobile density and RV outflow tract, but on review with other cardiologist, this does not appear to be embolus.  Also he was on apixaban prior so this seems very unlikely, we will proceed with venous duplex.  CT angiogram cannot be obtained due to renal failure and he cannot be anticoagulated due to ongoing abdominal bleeding. Troponins are elevated and flat.  CT abdomen/pelvis confirms hemoperitoneum  Impression -hemorrhagic shock, improving HOCM physiology Pericardial effusion without tamponade  Recommend -Wean Levophed to off -Keep preload high and heart rate down for   Family updated at bedside. Additional critical care time was 40 minutes  [name] [name] V. Elsworth Soho MD

## 2021-05-21 NOTE — Progress Notes (Signed)
BLE venous duplex has been completed.   Results can be found under chart review under CV PROC. 05/21/2021 5:47 PM Fatim Vanderschaaf RVT, RDMS

## 2021-05-21 NOTE — Plan of Care (Signed)
°  Problem: Health Behavior/Discharge Planning: Goal: Ability to manage health-related needs will improve Outcome: Progressing   Problem: Clinical Measurements: Goal: Ability to maintain clinical measurements within normal limits will improve Outcome: Progressing Goal: Will remain free from infection Outcome: Progressing Goal: Diagnostic test results will improve Outcome: Progressing Goal: Cardiovascular complication will be avoided Outcome: Progressing

## 2021-05-22 ENCOUNTER — Other Ambulatory Visit: Payer: Self-pay

## 2021-05-22 ENCOUNTER — Inpatient Hospital Stay (HOSPITAL_COMMUNITY): Payer: Medicare HMO | Admitting: Anesthesiology

## 2021-05-22 ENCOUNTER — Encounter (HOSPITAL_COMMUNITY)
Admission: RE | Disposition: A | Discharge status: Home / Self Care | Payer: Self-pay | Source: Ambulatory Visit | Attending: General Surgery

## 2021-05-22 DIAGNOSIS — I10 Essential (primary) hypertension: Secondary | ICD-10-CM

## 2021-05-22 DIAGNOSIS — N179 Acute kidney failure, unspecified: Secondary | ICD-10-CM | POA: Diagnosis not present

## 2021-05-22 DIAGNOSIS — K661 Hemoperitoneum: Secondary | ICD-10-CM

## 2021-05-22 DIAGNOSIS — I2510 Atherosclerotic heart disease of native coronary artery without angina pectoris: Secondary | ICD-10-CM

## 2021-05-22 DIAGNOSIS — I252 Old myocardial infarction: Secondary | ICD-10-CM

## 2021-05-22 DIAGNOSIS — R578 Other shock: Secondary | ICD-10-CM | POA: Diagnosis not present

## 2021-05-22 HISTORY — PX: LAPAROSCOPY: SHX197

## 2021-05-22 LAB — GLUCOSE, CAPILLARY
Glucose-Capillary: 130 mg/dL — ABNORMAL HIGH (ref 70–99)
Glucose-Capillary: 149 mg/dL — ABNORMAL HIGH (ref 70–99)
Glucose-Capillary: 160 mg/dL — ABNORMAL HIGH (ref 70–99)
Glucose-Capillary: 169 mg/dL — ABNORMAL HIGH (ref 70–99)
Glucose-Capillary: 173 mg/dL — ABNORMAL HIGH (ref 70–99)
Glucose-Capillary: 207 mg/dL — ABNORMAL HIGH (ref 70–99)
Glucose-Capillary: 214 mg/dL — ABNORMAL HIGH (ref 70–99)

## 2021-05-22 LAB — BLOOD GAS, ARTERIAL
Acid-Base Excess: 0.8 mmol/L (ref 0.0–2.0)
Bicarbonate: 25.2 mmol/L (ref 20.0–28.0)
O2 Saturation: 99.6 %
Patient temperature: 36.3
pCO2 arterial: 37 mmHg (ref 32–48)
pH, Arterial: 7.44 (ref 7.35–7.45)
pO2, Arterial: 76 mmHg — ABNORMAL LOW (ref 83–108)

## 2021-05-22 LAB — CBC
HCT: 20.2 % — ABNORMAL LOW (ref 39.0–52.0)
HCT: 24.8 % — ABNORMAL LOW (ref 39.0–52.0)
Hemoglobin: 7 g/dL — ABNORMAL LOW (ref 13.0–17.0)
Hemoglobin: 8.4 g/dL — ABNORMAL LOW (ref 13.0–17.0)
MCH: 30 pg (ref 26.0–34.0)
MCH: 30.4 pg (ref 26.0–34.0)
MCHC: 34.7 g/dL (ref 30.0–36.0)
MCHC: 33.9 g/dL (ref 30.0–36.0)
MCV: 86.7 fL (ref 80.0–100.0)
MCV: 89.9 fL (ref 80.0–100.0)
Platelets: 56 10*3/uL — ABNORMAL LOW (ref 150–400)
Platelets: 54 10*3/uL — ABNORMAL LOW (ref 150–400)
RBC: 2.33 MIL/uL — ABNORMAL LOW (ref 4.22–5.81)
RBC: 2.76 MIL/uL — ABNORMAL LOW (ref 4.22–5.81)
RDW: 15.9 % — ABNORMAL HIGH (ref 11.5–15.5)
RDW: 15.7 % — ABNORMAL HIGH (ref 11.5–15.5)
WBC: 15.5 10*3/uL — ABNORMAL HIGH (ref 4.0–10.5)
WBC: 16.7 10*3/uL — ABNORMAL HIGH (ref 4.0–10.5)
nRBC: 0 % (ref 0.0–0.2)
nRBC: 0.1 % (ref 0.0–0.2)

## 2021-05-22 LAB — BASIC METABOLIC PANEL
Anion gap: 7 (ref 5–15)
BUN: 38 mg/dL — ABNORMAL HIGH (ref 8–23)
CO2: 27 mmol/L (ref 22–32)
Calcium: 7.6 mg/dL — ABNORMAL LOW (ref 8.9–10.3)
Chloride: 96 mmol/L — ABNORMAL LOW (ref 98–111)
Creatinine, Ser: 2.95 mg/dL — ABNORMAL HIGH (ref 0.61–1.24)
GFR, Estimated: 21 mL/min — ABNORMAL LOW (ref >60)
Glucose, Bld: 130 mg/dL — ABNORMAL HIGH (ref 70–99)
Potassium: 3.8 mmol/L (ref 3.5–5.1)
Sodium: 130 mmol/L — ABNORMAL LOW (ref 135–145)

## 2021-05-22 LAB — PREPARE RBC (CROSSMATCH)

## 2021-05-22 LAB — PHOSPHORUS: Phosphorus: 5.2 mg/dL — ABNORMAL HIGH (ref 2.5–4.6)

## 2021-05-22 LAB — MAGNESIUM: Magnesium: 2 mg/dL (ref 1.7–2.4)

## 2021-05-22 SURGERY — LAPAROSCOPY, DIAGNOSTIC
Anesthesia: General

## 2021-05-22 MED ORDER — SODIUM CHLORIDE 0.9% IV SOLUTION: Freq: Once | INTRAVENOUS | Status: AC

## 2021-05-22 MED ORDER — PHENYLEPHRINE HCL-NACL 20-0.9 MG/250ML-% IV SOLN
INTRAVENOUS | Status: DC | PRN
  Administered 2021-05-22: 75 ug/min via INTRAVENOUS

## 2021-05-22 MED ORDER — FENTANYL CITRATE (PF) 100 MCG/2ML IJ SOLN
INTRAMUSCULAR | Status: DC | PRN
  Administered 2021-05-22: 100 ug via INTRAVENOUS

## 2021-05-22 MED ORDER — SUCCINYLCHOLINE CHLORIDE 200 MG/10ML IV SOSY
PREFILLED_SYRINGE | INTRAVENOUS | Status: DC | PRN
  Administered 2021-05-22: 100 mg via INTRAVENOUS

## 2021-05-22 MED ORDER — SODIUM CHLORIDE 0.9 % IV SOLN
INTRAVENOUS | Status: AC
  Filled 2021-05-22: qty 20

## 2021-05-22 MED ORDER — SODIUM CHLORIDE 0.9 % IV SOLN
INTRAVENOUS | Status: DC | PRN
  Administered 2021-05-22: 2 g via INTRAVENOUS

## 2021-05-22 MED ORDER — DEXAMETHASONE SODIUM PHOSPHATE 10 MG/ML IJ SOLN
INTRAMUSCULAR | Status: DC | PRN
  Administered 2021-05-22: 4 mg via INTRAVENOUS

## 2021-05-22 MED ORDER — BUPIVACAINE LIPOSOME 1.3 % IJ SUSP
INTRAMUSCULAR | Status: AC
  Filled 2021-05-22: qty 20

## 2021-05-22 MED ORDER — CEFAZOLIN SODIUM-DEXTROSE 2-4 GM/100ML-% IV SOLN
INTRAVENOUS | Status: AC
  Filled 2021-05-22: qty 100

## 2021-05-22 MED ORDER — LIDOCAINE 2% (20 MG/ML) 5 ML SYRINGE
INTRAMUSCULAR | Status: DC | PRN
  Administered 2021-05-22: 60 mg via INTRAVENOUS

## 2021-05-22 MED ORDER — LACTATED RINGERS IR SOLN
Status: DC | PRN
Start: 2021-05-22 — End: 2021-05-22
  Administered 2021-05-22 (×4): 1000 mL

## 2021-05-22 MED ORDER — BUPIVACAINE-EPINEPHRINE (PF) 0.25% -1:200000 IJ SOLN
INTRAMUSCULAR | Status: AC
  Filled 2021-05-22: qty 30

## 2021-05-22 MED ORDER — ONDANSETRON HCL 4 MG/2ML IJ SOLN
4.0000 mg | Freq: Once | INTRAMUSCULAR | Status: AC | PRN
  Administered 2021-05-22: 4 mg via INTRAVENOUS

## 2021-05-22 MED ORDER — BUPIVACAINE LIPOSOME 1.3 % IJ SUSP
INTRAMUSCULAR | Status: DC | PRN
  Administered 2021-05-22: 20 mL

## 2021-05-22 MED ORDER — BUPIVACAINE-EPINEPHRINE 0.25% -1:200000 IJ SOLN
INTRAMUSCULAR | Status: DC | PRN
  Administered 2021-05-22: 30 mL

## 2021-05-22 MED ORDER — PROPOFOL 10 MG/ML IV BOLUS
INTRAVENOUS | Status: DC | PRN
  Administered 2021-05-22: 100 mg via INTRAVENOUS

## 2021-05-22 MED ORDER — SODIUM CHLORIDE 0.9 % IV SOLN
1.0000 g | INTRAVENOUS | Status: DC
  Filled 2021-05-22: qty 1

## 2021-05-22 MED ORDER — LACTATED RINGERS IV SOLN: INTRAVENOUS | Status: DC | PRN

## 2021-05-22 MED ORDER — LABETALOL HCL 5 MG/ML IV SOLN
10.0000 mg | INTRAVENOUS | Status: DC | PRN
  Administered 2021-05-22: 10 mg via INTRAVENOUS
  Administered 2021-05-23: 20 mg via INTRAVENOUS
  Administered 2021-05-23 (×2): 10 mg via INTRAVENOUS
  Administered 2021-05-23 – 2021-05-24 (×4): 20 mg via INTRAVENOUS
  Filled 2021-05-22 (×9): qty 4

## 2021-05-22 MED ORDER — OXYCODONE HCL 5 MG/5ML PO SOLN: 5.0000 mg | Freq: Once | ORAL | Status: DC | PRN

## 2021-05-22 MED ORDER — FENTANYL CITRATE (PF) 100 MCG/2ML IJ SOLN
INTRAMUSCULAR | Status: AC
  Filled 2021-05-22: qty 2

## 2021-05-22 MED ORDER — SODIUM CHLORIDE 0.9 % IV SOLN
INTRAVENOUS | Status: AC
  Filled 2021-05-22: qty 2

## 2021-05-22 MED ORDER — FENTANYL CITRATE PF 50 MCG/ML IJ SOSY: 25.0000 ug | PREFILLED_SYRINGE | INTRAMUSCULAR | Status: DC | PRN

## 2021-05-22 MED ORDER — 0.9 % SODIUM CHLORIDE (POUR BTL) OPTIME
TOPICAL | Status: DC | PRN
  Administered 2021-05-22: 2000 mL

## 2021-05-22 MED ORDER — EPHEDRINE 5 MG/ML INJ
INTRAVENOUS | Status: AC
  Filled 2021-05-22: qty 5

## 2021-05-22 MED ORDER — PHENYLEPHRINE HCL-NACL 20-0.9 MG/250ML-% IV SOLN
INTRAVENOUS | Status: AC
  Filled 2021-05-22: qty 250

## 2021-05-22 MED ORDER — ROCURONIUM BROMIDE 10 MG/ML (PF) SYRINGE
PREFILLED_SYRINGE | INTRAVENOUS | Status: DC | PRN
Start: 2021-05-22 — End: 2021-05-22
  Administered 2021-05-22: 70 mg via INTRAVENOUS

## 2021-05-22 MED ORDER — OXYCODONE HCL 5 MG PO TABS: 5.0000 mg | ORAL_TABLET | Freq: Once | ORAL | Status: DC | PRN

## 2021-05-22 MED ORDER — EPHEDRINE SULFATE-NACL 50-0.9 MG/10ML-% IV SOSY
PREFILLED_SYRINGE | INTRAVENOUS | Status: DC | PRN
  Administered 2021-05-22: 10 mg via INTRAVENOUS

## 2021-05-22 MED ORDER — PROPOFOL 10 MG/ML IV BOLUS
INTRAVENOUS | Status: AC
  Filled 2021-05-22: qty 20

## 2021-05-22 SURGICAL SUPPLY — 52 items
ADH SKN CLS APL DERMABOND .7 (GAUZE/BANDAGES/DRESSINGS) ×1
APPLIER CLIP 5 13 M/L LIGAMAX5 (MISCELLANEOUS)
APPLIER CLIP ROT 10 11.4 M/L (STAPLE)
APR CLP MED LRG 11.4X10 (STAPLE)
APR CLP MED LRG 5 ANG JAW (MISCELLANEOUS)
BAG COUNTER SPONGE SURGICOUNT (BAG) IMPLANT
BAG SPNG CNTER NS LX DISP (BAG)
BLADE EXTENDED COATED 6.5IN (ELECTRODE) IMPLANT
CLIP APPLIE 5 13 M/L LIGAMAX5 (MISCELLANEOUS) IMPLANT
CLIP APPLIE ROT 10 11.4 M/L (STAPLE) IMPLANT
COVER MAYO STAND STRL (DRAPES) ×3 IMPLANT
DERMABOND ADVANCED (GAUZE/BANDAGES/DRESSINGS) ×1
DERMABOND ADVANCED .7 DNX12 (GAUZE/BANDAGES/DRESSINGS) IMPLANT
ELECT REM PT RETURN 15FT ADLT (MISCELLANEOUS) ×3 IMPLANT
GAUZE SPONGE 4X4 12PLY STRL (GAUZE/BANDAGES/DRESSINGS) ×3 IMPLANT
GLOVE SURG ENC MOIS LTX SZ6.5 (GLOVE) ×6 IMPLANT
GLOVE SURG UNDER LTX SZ6.5 (GLOVE) ×3 IMPLANT
GLOVE SURG UNDER POLY LF SZ7 (GLOVE) ×3 IMPLANT
GOWN STRL REUS W/TWL XL LVL3 (GOWN DISPOSABLE) ×9 IMPLANT
HANDLE SUCTION POOLE (INSTRUMENTS) IMPLANT
IRRIG SUCT STRYKERFLOW 2 WTIP (MISCELLANEOUS) ×2
IRRIGATION SUCT STRKRFLW 2 WTP (MISCELLANEOUS) ×2 IMPLANT
KIT TURNOVER KIT A (KITS) IMPLANT
PACK COLON (CUSTOM PROCEDURE TRAY) ×2 IMPLANT
PACK GENERAL/GYN (CUSTOM PROCEDURE TRAY) ×1 IMPLANT
PACK LAPAROSCOPY BASIN (CUSTOM PROCEDURE TRAY) ×1 IMPLANT
SCISSORS LAP 5X35 DISP (ENDOMECHANICALS) ×1 IMPLANT
SEALER TISSUE G2 STRG ARTC 35C (ENDOMECHANICALS) IMPLANT
SET TUBE SMOKE EVAC HIGH FLOW (TUBING) ×3 IMPLANT
SLEEVE XCEL OPT CAN 5 100 (ENDOMECHANICALS) ×5 IMPLANT
SPIKE FLUID TRANSFER (MISCELLANEOUS) ×3 IMPLANT
SPONGE T-LAP 18X18 ~~LOC~~+RFID (SPONGE) ×1 IMPLANT
STAPLER VISISTAT 35W (STAPLE) IMPLANT
SUCTION POOLE HANDLE (INSTRUMENTS)
SUT NOVA 1 T20/GS 25DT (SUTURE) IMPLANT
SUT PDS AB 1 CT1 27 (SUTURE) ×4 IMPLANT
SUT PDS AB 1 TP1 96 (SUTURE) IMPLANT
SUT PROLENE 2 0 KS (SUTURE) IMPLANT
SUT PROLENE 2 0 SH DA (SUTURE) IMPLANT
SUT SILK 2 0 (SUTURE) ×2
SUT SILK 2 0 SH CR/8 (SUTURE) ×3 IMPLANT
SUT SILK 2-0 18XBRD TIE 12 (SUTURE) ×2 IMPLANT
SUT SILK 3 0 (SUTURE) ×2
SUT SILK 3 0 SH CR/8 (SUTURE) ×3 IMPLANT
SUT SILK 3-0 18XBRD TIE 12 (SUTURE) ×2 IMPLANT
TOWEL OR 17X26 10 PK STRL BLUE (TOWEL DISPOSABLE) IMPLANT
TOWEL OR NON WOVEN STRL DISP B (DISPOSABLE) ×3 IMPLANT
TRAY FOLEY MTR SLVR 16FR STAT (SET/KITS/TRAYS/PACK) ×2 IMPLANT
TROCAR BLADELESS OPT 5 100 (ENDOMECHANICALS) ×3 IMPLANT
TROCAR XCEL BLUNT TIP 100MML (ENDOMECHANICALS) IMPLANT
TROCAR XCEL NON-BLD 11X100MML (ENDOMECHANICALS) IMPLANT
TUBING CONNECTING 10 (TUBING) ×6 IMPLANT

## 2021-05-22 NOTE — Anesthesia Procedure Notes (Signed)
Procedure Name: Intubation Date/Time: 05/22/2021 5:35 PM Performed by: Rosaland Lao, CRNA Pre-anesthesia Checklist: Patient identified, Emergency Drugs available, Suction available and Patient being monitored Patient Re-evaluated:Patient Re-evaluated prior to induction Oxygen Delivery Method: Circle system utilized Preoxygenation: Pre-oxygenation with 100% oxygen Induction Type: IV induction and Rapid sequence Laryngoscope Size: Miller and 3 Grade View: Grade I Tube type: Oral Tube size: 7.5 mm Number of attempts: 1 Airway Equipment and Method: Stylet Placement Confirmation: ETT inserted through vocal cords under direct vision, positive ETCO2 and breath sounds checked- equal and bilateral Secured at: 23 cm Tube secured with: Tape Dental Injury: Teeth and Oropharynx as per pre-operative assessment

## 2021-05-22 NOTE — Progress Notes (Signed)
Sent lab for CBC from CVC line, sluggish when draw the blood, lab called redraw for the spsecimen was clotted. Told lab had to do it.

## 2021-05-22 NOTE — Progress Notes (Signed)
. °  Transition of Care [facility]) Screening Note   Patient Details  Name: [name] [name] Date of Birth: 12-17-1940   Transition of Care [facility]) CM/SW Contact:    Illene Regulus, LCSW Phone Number: 05/22/2021, 9:42 AM    Transition of Care Department [facility]) has reviewed patient and no TOC needs have been identified at this time. We will continue to monitor patient advancement through interdisciplinary progression rounds. If new patient transition needs arise, please place a TOC consult.

## 2021-05-22 NOTE — Progress Notes (Signed)
Agree with Dr Gala Lewandowsky assessment from earlier today.  Pt seems to be hemodynamically recovering from bleeding even on Fri evening, but hgb levels have not remained stable.  Unclear if he is just equilibrating or if there is ongoing low grade bleeding.  I have recommended diagnostic laparoscopy with washout and evaluation for bleeding source.  Risks include but are not limited to further bleeding, damage to adjacent structures, infection and prolonged ileus.  I believe he understands this and wishes to proceed.    Rosario Adie, MD  [facility]

## 2021-05-22 NOTE — Anesthesia Postprocedure Evaluation (Signed)
Anesthesia Post Note  Patient: [name] [name]  Procedure(s) Performed: LAPAROSCOPY DIAGNOSTIC WITH WASHOUT OF ABDOMEN     Patient location during evaluation: PACU Anesthesia Type: General Level of consciousness: awake and alert Pain management: pain level controlled Vital Signs Assessment: post-procedure vital signs reviewed and stable Respiratory status: spontaneous breathing, nonlabored ventilation and respiratory function stable Cardiovascular status: blood pressure returned to baseline and stable Postop Assessment: no apparent nausea or vomiting Anesthetic complications: no   No notable events documented.  Last Vitals:  Vitals:   05/22/21 2246 05/22/21 2300  BP: (!) 166/59 (!) 190/61  Pulse: 71 84  Resp: 10 12  Temp:    SpO2: 95% 93%    Last Pain:  Vitals:   05/22/21 2000  TempSrc: Oral  PainSc:                  Lidia Collum

## 2021-05-22 NOTE — Progress Notes (Signed)
Assessment & Plan: POD#2 - status post robotic extended right colectomy - Dr. Leighton Ruff Post op hypotension, acute blood loss anemia Acute resuscitation in PACU by anesthesia - Dr. Christella Hartigan Transfuse 4U PRBC total to date - Hgb down to 7.0 this AM - plan transfuse 2U PRBC now Pressor support weaned Urine output markedly improved overnight - creatinine 2.95 this AM Cardiac echo with good LV function, venous duplex negative for DVT Troponin elevated, lactate level remains elevated by improving CT yesterday with hemoperitoneum, no sign of leak or ischemia Remain on full liquid diet for now  Await rounds this AM by CCM.  Will discuss with CCM and with Dr. Marcello Moores - continued support as there is overall improvement, versus return to OR for laparoscopy/laparotomy for ongoing anemia and possible bleeding source.  Discussed with patient and family this AM.        Armandina Gemma, [facility]       Office: [phone]   Chief Complaint: Acute blood loss after right colectomy  Subjective: Patient in bed, mild pain.  Wife at bedside.  Taking full liquid diet.  Wants to get up and take a shower.  Objective: Vital signs in last 24 hours: Temp:  [97.2 F (36.2 C)-98.2 F (36.8 C)] 98.1 F (36.7 C) (02/26 0721) Pulse Rate:  [64-80] 67 (02/26 0721) Resp:  [8-22] 12 (02/26 0721) BP: (131-150)/(39-76) 131/39 (02/25 1830) SpO2:  [83 %-100 %] 94 % (02/26 0721) Arterial Line BP: (115-198)/(43-67) 170/49 (02/26 0721) Weight:  [108.9 kg] 108.9 kg (02/26 0500) Last BM Date : 05/19/21  Intake/Output from previous day: 02/25 0701 - 02/26 0700 In: 2734.8 [P.O.:240; I.V.:2494.8] Out: 720 [Urine:720] Intake/Output this shift: No intake/output data recorded.  Physical Exam: HEENT - sclerae clear, mucous membranes moist Neck - soft Abdomen - mild distension, mild diffuse tenderness, wounds dry and intact Ext - no edema, non-tender Neuro - alert & oriented, no focal  deficits  Lab Results:  Recent Labs    05/21/21 0418 05/21/21 1158 05/22/21 0343  WBC 26.4*  --  15.5*  HGB 10.5* 9.4* 7.0*  HCT 32.1* 27.6* 20.2*  PLT 116*  --  56*   BMET Recent Labs    05/21/21 1617 05/22/21 0343  NA 130* 130*  K 3.9 3.8  CL 99 96*  CO2 19* 27  GLUCOSE 291* 130*  BUN 32* 38*  CREATININE 2.80* 2.95*  CALCIUM 7.3* 7.6*   PT/INR Recent Labs    05/20/21 1545 05/20/21 2009  LABPROT 14.7 17.0*  INR 1.2 1.4*   Comprehensive Metabolic Panel:    Component Value Date/Time   NA 130 (L) 05/22/2021 0343   NA 130 (L) 05/21/2021 1617   K 3.8 05/22/2021 0343   K 3.9 05/21/2021 1617   CL 96 (L) 05/22/2021 0343   CL 99 05/21/2021 1617   CO2 27 05/22/2021 0343   CO2 19 (L) 05/21/2021 1617   BUN 38 (H) 05/22/2021 0343   BUN 32 (H) 05/21/2021 1617   CREATININE 2.95 (H) 05/22/2021 0343   CREATININE 2.80 (H) 05/21/2021 1617   GLUCOSE 130 (H) 05/22/2021 0343   GLUCOSE 291 (H) 05/21/2021 1617   CALCIUM 7.6 (L) 05/22/2021 0343   CALCIUM 7.3 (L) 05/21/2021 1617   AST 29 03/08/2021 1452   AST 22 07/29/2018 0410   ALT 24 03/08/2021 1452   ALT 22 07/29/2018 0410   ALKPHOS 82 03/08/2021 1452   ALKPHOS 92 07/29/2018 0410  BILITOT 0.5 03/08/2021 1452   BILITOT 0.4 07/29/2018 0410   PROT 6.9 03/08/2021 1452   PROT 6.4 (L) 07/29/2018 0410   ALBUMIN 4.1 03/08/2021 1452   ALBUMIN 3.4 (L) 07/29/2018 0410    Studies/Results: CT ABDOMEN PELVIS WO CONTRAST  Result Date: 05/21/2021 CLINICAL DATA:  Postop abdominal pain with hypotension. EXAM: CT ABDOMEN AND PELVIS WITHOUT CONTRAST TECHNIQUE: Multidetector CT imaging of the abdomen and pelvis was performed following the standard protocol without IV contrast. RADIATION DOSE REDUCTION: This exam was performed according to the departmental dose-optimization program which includes automated exposure control, adjustment of the mA and/or kV according to patient size and/or use of iterative reconstruction technique.  COMPARISON:  03/18/2021 FINDINGS: Lower chest: Small pericardial effusion and pleural effusions with dependent atelectasis. Distended lower esophagus and conjunction with gas and fluid distended stomach. Hepatobiliary: No focal liver abnormality.No evidence of biliary obstruction or stone. Pancreas: Generalized atrophy Spleen: Unremarkable. Adrenals/Urinary Tract: Negative adrenals. No hydronephrosis or stone. Symmetric renal atrophy. High-density 8 mm nodule at the left lower pole, likely hemorrhagic cyst. Punctate left renal calculus. Foley catheter with collapsed bladder. Stomach/Bowel: Right hemicolectomy. Diffuse hemoperitoneum present throughout the interloop spaces and greatest at the pelvis, which limits localization of the source. Gas and fluid distended stomach extending up the esophagus. No pneumatosis or portal venous gas. Vascular/Lymphatic: Confluent atheromatous calcification of the aorta. No mass or adenopathy. Reproductive:Prostate brachytherapy seeds. Other: In addition to the hemoperitoneum there is more fluid density around the liver which is small volume. Expected postoperative pneumoperitoneum. Extensive the abdominal wall gas and swelling, no focal hematoma. Fatty enlargement of the bilateral inguinal canal. Musculoskeletal: No acute abnormalities. IMPRESSION: 1. Diffuse interloop and pelvic hemoperitoneum. 2. Extensive abdominal wall swelling. 3. Gas and fluid distended stomach with fluid refluxing up the descending lower esophagus. 4. Atelectasis and small pleural effusions. Electronically Signed   By: Jorje Guild M.D.   On: 05/21/2021 10:47   DG Chest 1 View  Result Date: 05/20/2021 CLINICAL DATA:  Central line placement EXAM: CHEST  1 VIEW COMPARISON:  07/28/2018 FINDINGS: Heart is normal size. Lungs clear. No effusions or pneumothorax. Left subclavian central line in place with the tip at the cavoatrial junction. IMPRESSION: Left central line tip at the cavoatrial junction.  No  pneumothorax. Electronically Signed   By: Rolm Baptise M.D.   On: 05/20/2021 23:31   ECHOCARDIOGRAM COMPLETE  Addendum Date: 05/21/2021   Asked to review study by ordering team regarding findings in region of the RVOT and pulmonic valve. There is bubble contrast in this region (likely from peripheral IV) and most likely the mobile density is secondary to artifact. Images that follow in the  absence of bubble contrast do not show a consistent abnormality to suggest definitive thrombus or vegatation. Pericardial effusion is moderate posteriorly and small anteriorly. Rozann Lesches MD Electronically Amended 05/21/2021, 2:18 PM   Final Loreli Dollar)    Result Date: 05/21/2021    ECHOCARDIOGRAM REPORT   Patient Name:   KOKI BUXTON Date of Exam: 05/21/2021 Medical Rec #:  [id number]     Height:       71.0 in Accession #:    [id number]    Weight:       232.1 lb Date of Birth:  1940-09-05     BSA:          2.246 m Patient Age:    81 years      BP:           166/61 mmHg  Patient Gender: M             HR:           74 bpm. Exam Location:  Inpatient Procedure: 2D Echo, Color Doppler and Cardiac Doppler Indications:     Post-op shock, ?cardiogenic  History:         Patient has prior history of Echocardiogram examinations, most                  recent 03/05/2019. CAD, Arrythmias:Atrial Fibrillation; Risk                  Factors:Hypertension, Diabetes and Dyslipidemia.  Sonographer:     Raquel Sarna Senior RDCS Referring Phys:  [id number] [facility] Diagnosing Phys: Fransico Him MD  Sonographer Comments: Technically difficult study with poor apical and subcostal windows. IMPRESSIONS  1. Left ventricular ejection fraction, by estimation, is 70 to 75%. The left ventricle has hyperdynamic function. The left ventricle has no regional wall motion abnormalities. There is severe concentric left ventricular hypertrophy. There is a resting LVOT gradient of 82mmHg. Findings suggestive of HOCM.     Left ventricular diastolic parameters are  consistent with Grade I diastolic dysfunction (impaired relaxation). Elevated left ventricular end-diastolic pressure.  2. Right ventricular systolic function is normal. The right ventricular size is normal. Tricuspid regurgitation signal is inadequate for assessing PA pressure.  3. The mitral valve is normal in structure. No evidence of mitral valve regurgitation. No evidence of mitral stenosis.  4. The aortic valve is calcified. Aortic valve regurgitation is not visualized. Aortic valve sclerosis/calcification is present, without any evidence of aortic stenosis. Aortic valve area, by VTI measures 3.17 cm. Aortic valve mean gradient measures 9.0 mmHg. Aortic valve Vmax measures 2.10 m/s.  5. There is a mobile density in the RVOT in the vicinity of the pulmonic valve. Cannot rule out vegetation vs thrombus by this study.  6. Aortic dilatation noted. There is borderline dilatation of the aortic root, measuring 37 mm. There is borderline dilatation of the ascending aorta, measuring 37 mm.  7. The inferior vena cava is normal in size with greater than 50% respiratory variability, suggesting right atrial pressure of 3 mmHg.  8. A small pericardial effusion is present. The pericardial effusion is circumferential. There is no evidence of cardiac tamponade.  9. Consider Chest CTA to rule out PE an if negative consider TEE to assess RVOT and PV further given mobile target if endocarditis if a concern. FINDINGS  Left Ventricle: There is a resting LVOT gradient of 44mmHg. Findings suggestive of HOCM. Left ventricular ejection fraction, by estimation, is 70 to 75%. The left ventricle has hyperdynamic function. The left ventricle has no regional wall motion abnormalities. The left ventricular internal cavity size was small. There is severe concentric left ventricular hypertrophy. Left ventricular diastolic parameters are consistent with Grade I diastolic dysfunction (impaired relaxation). Elevated left ventricular  end-diastolic pressure. Right Ventricle: The right ventricular size is normal. No increase in right ventricular wall thickness. Right ventricular systolic function is normal. Tricuspid regurgitation signal is inadequate for assessing PA pressure. Left Atrium: Left atrial size was normal in size. Right Atrium: Right atrial size was normal in size. Pericardium: A small pericardial effusion is present. The pericardial effusion is circumferential. There is no evidence of cardiac tamponade. Presence of epicardial fat layer. Mitral Valve: The mitral valve is normal in structure. Mild to moderate mitral annular calcification. No evidence of mitral valve regurgitation. No evidence of mitral valve stenosis. Tricuspid  Valve: The tricuspid valve is normal in structure. Tricuspid valve regurgitation is not demonstrated. No evidence of tricuspid stenosis. Aortic Valve: The aortic valve is calcified. Aortic valve regurgitation is not visualized. Aortic valve sclerosis/calcification is present, without any evidence of aortic stenosis. Aortic valve mean gradient measures 9.0 mmHg. Aortic valve peak gradient measures 17.6 mmHg. Aortic valve area, by VTI measures 3.17 cm. Pulmonic Valve: There is a mobile density in the RVOT in the vicinity of the pulmonic valve. Cannot rule out vegetation. The pulmonic valve was normal in structure. Pulmonic valve regurgitation is trivial. No evidence of pulmonic stenosis. Aorta: Aortic dilatation noted. There is borderline dilatation of the aortic root, measuring 37 mm. There is borderline dilatation of the ascending aorta, measuring 37 mm. Venous: The inferior vena cava is normal in size with greater than 50% respiratory variability, suggesting right atrial pressure of 3 mmHg. IAS/Shunts: No atrial level shunt detected by color flow Doppler.  LEFT VENTRICLE PLAX 2D LVIDd:         2.60 cm   Diastology LVIDs:         1.60 cm   LV e' medial:    4.57 cm/s LV PW:         2.10 cm   LV E/e' medial:   15.5 LV IVS:        2.00 cm   LV e' lateral:   4.24 cm/s LVOT diam:     2.20 cm   LV E/e' lateral: 16.7 LV SV:         89 LV SV Index:   40 LVOT Area:     3.80 cm  RIGHT VENTRICLE RV S prime:     15.40 cm/s TAPSE (M-mode): 2.4 cm LEFT ATRIUM            Index        RIGHT ATRIUM           Index LA diam:      3.70 cm  1.65 cm/m   RA Area:     13.60 cm LA Vol (A4C): 133.0 ml 59.20 ml/m  RA Volume:   28.00 ml  12.46 ml/m  AORTIC VALVE AV Area (Vmax):    2.52 cm AV Area (Vmean):   2.76 cm AV Area (VTI):     3.17 cm AV Vmax:           210.00 cm/s AV Vmean:          139.000 cm/s AV VTI:            0.282 m AV Peak Grad:      17.6 mmHg AV Mean Grad:      9.0 mmHg LVOT Vmax:         139.00 cm/s LVOT Vmean:        101.000 cm/s LVOT VTI:          0.235 m LVOT/AV VTI ratio: 0.83  AORTA Ao Root diam: 3.70 cm Ao Asc diam:  3.70 cm MITRAL VALVE MV Area (PHT): 1.68 cm     SHUNTS MV Decel Time: 451 msec     Systemic VTI:  0.24 m MV E velocity: 71.00 cm/s   Systemic Diam: 2.20 cm MV A velocity: 116.00 cm/s MV E/A ratio:  0.61 Fransico Him MD Electronically signed by Fransico Him MD Signature Date/Time: 05/21/2021/11:43:40 AM    Final (Amended)    VAS [name] LOWER EXTREMITY VENOUS (DVT)  Result Date: 05/21/2021  Lower Venous DVT Study Patient Name:  RAKESH DUTKO  Date of Exam:   05/21/2021 Medical Rec #: [id number]      Accession #:    [id number] Date of Birth: 26-Aug-1940      Patient Gender: M Patient Age:   42 years Exam Location:  [facility] Procedure:      VAS [name] LOWER EXTREMITY VENOUS (DVT) Referring Phys: [facility] --------------------------------------------------------------------------------  Indications: Pain, and Patient removed from anticoagulation secondary to bleed.  Comparison Study: No Prior study Performing Technologist: Sharion Dove RVS  Examination Guidelines: A complete evaluation includes B-mode imaging, spectral Doppler, color Doppler, and power Doppler as needed of all accessible portions of  each vessel. Bilateral testing is considered an integral part of a complete examination. Limited examinations for reoccurring indications may be performed as noted. The reflux portion of the exam is performed with the patient in reverse Trendelenburg.  +---------+---------------+---------+-----------+----------+--------------+  RIGHT     Compressibility Phasicity Spontaneity Properties Thrombus Aging  +---------+---------------+---------+-----------+----------+--------------+  CFV       Full            Yes       Yes                                    +---------+---------------+---------+-----------+----------+--------------+  SFJ       Full                                                             +---------+---------------+---------+-----------+----------+--------------+  FV Prox   Full            Yes       Yes                                    +---------+---------------+---------+-----------+----------+--------------+  FV Mid    Full            Yes       Yes                                    +---------+---------------+---------+-----------+----------+--------------+  FV Distal Full            Yes       Yes                                    +---------+---------------+---------+-----------+----------+--------------+  PFV       Full                                                             +---------+---------------+---------+-----------+----------+--------------+  POP       Full            Yes       Yes                                    +---------+---------------+---------+-----------+----------+--------------+  PTV       Full                                                             +---------+---------------+---------+-----------+----------+--------------+  PERO      Full                                                             +---------+---------------+---------+-----------+----------+--------------+  GSV       Full                                                              +---------+---------------+---------+-----------+----------+--------------+   +---------+---------------+---------+-----------+----------+--------------+  LEFT      Compressibility Phasicity Spontaneity Properties Thrombus Aging  +---------+---------------+---------+-----------+----------+--------------+  CFV       Full            Yes       Yes                                    +---------+---------------+---------+-----------+----------+--------------+  SFJ       Full                                                             +---------+---------------+---------+-----------+----------+--------------+  FV Prox   Full            Yes       Yes                                    +---------+---------------+---------+-----------+----------+--------------+  FV Mid    Full            Yes       Yes                                    +---------+---------------+---------+-----------+----------+--------------+  FV Distal Full            Yes       Yes                                    +---------+---------------+---------+-----------+----------+--------------+  PFV       Full                                                             +---------+---------------+---------+-----------+----------+--------------+  POP       Full            Yes       Yes                                    +---------+---------------+---------+-----------+----------+--------------+  PTV       Full                                                             +---------+---------------+---------+-----------+----------+--------------+  PERO      Full                                                             +---------+---------------+---------+-----------+----------+--------------+     Summary: BILATERAL: - No evidence of deep vein thrombosis seen in the lower extremities, bilaterally. -No evidence of popliteal cyst, bilaterally.   *See table(s) above for measurements and observations.    Preliminary       Armandina Gemma 05/22/2021   Patient ID:  Cala Bradford, male   DOB: [birth year], 81 y.o.   MRN: [id number]

## 2021-05-22 NOTE — Progress Notes (Signed)
PT Cancellation Note  Patient Details Name: APOLLO TIMOTHY MRN: [id number] DOB: November 21, 1940   Cancelled Treatment:    Reason Eval/Treat Not Completed: Medical issues which prohibited therapy (Patient remains medically unstable and hgb 7.0 this am, pt to go back to OR for laparoscopy and evacuation of hemoperitoneum and hopefully control of source of bleeding.)   Verner Mould, DPT Acute Rehabilitation Services Office [phone] Pager [phone]

## 2021-05-22 NOTE — Transfer of Care (Signed)
Immediate Anesthesia Transfer of Care Note  Patient: [name] [name]  Procedure(s) Performed: Procedure(s): LAPAROSCOPY DIAGNOSTIC WITH WASHOUT OF ABDOMEN (N/A)  Patient Location: PACU  Anesthesia Type:General  Level of Consciousness: Alert, Awake, Oriented  Airway & Oxygen Therapy: Patient Spontanous Breathing  Post-op Assessment: Report given to RN  Post vital signs: Reviewed and stable  Last Vitals:  Vitals:   05/22/21 1630 05/22/21 1645  BP:    Pulse: 72 73  Resp: (!) 22 17  Temp:    SpO2: 16% 109%    Complications: No apparent anesthesia complications

## 2021-05-22 NOTE — Anesthesia Preprocedure Evaluation (Signed)
Anesthesia Evaluation  Patient identified by MRN, date of birth, ID band Patient awake    Reviewed: Allergy & Precautions, NPO status , Patient's Chart, lab work & pertinent test results, reviewed documented beta blocker date and time   History of Anesthesia Complications Negative for: history of anesthetic complications  Airway Mallampati: II  TM Distance: >3 FB Neck ROM: Full    Dental  (+) Partial Lower, Dental Advisory Given   Pulmonary neg pulmonary ROS, former smoker,    Pulmonary exam normal        Cardiovascular hypertension, Pt. on medications and Pt. on home beta blockers + CAD, + Past MI and + Cardiac Stents (2019)  Normal cardiovascular exam+ dysrhythmias Atrial Fibrillation   H/o pericardial effusion s/p pericardial window  Echo 2020: EF 70-75%, mod-severe LVH, g1dd, nl RV size/fn, severe LAE, small circumferential pericardial effusion, restricted motion of L coronary cusp with no definite AS, mild aortic root dilatation   Neuro/Psych negative neurological ROS     GI/Hepatic Neg liver ROS, GERD  ,Colon cancer   Endo/Other  diabetes, Type 2, Oral Hypoglycemic Agents  Renal/GU ARFRenal disease   H/o prostate cancer    Musculoskeletal  (+) Arthritis ,   Abdominal   Peds  Hematology  (+) Blood dyscrasia, anemia , Eliquis   Anesthesia Other Findings  Pt is postop day 2 from robotic colectomy. Patient became hemodynamically unstable in PACU following surgery with acute drop in hemoglobin and suspicion for postop bleeding. He was given multiple units of blood and transferred to ICU where he required high dose vasopressors overnight on POD0. He has since stabilized and is now off vasopressors, but had another significant drop in hemoglobin overnight concerning for ongoing bleeding. He presents for reexploration. He has received a total of 5U PRBC and 3U FFP over the last 48 hours.  Reproductive/Obstetrics                              Anesthesia Physical  Anesthesia Plan  ASA: 4 and emergent  Anesthesia Plan: General   Post-op Pain Management:    Induction: Intravenous and Rapid sequence  PONV Risk Score and Plan: 3 and Ondansetron, Dexamethasone and Treatment may vary due to age or medical condition  Airway Management Planned: Oral ETT  Additional Equipment: None  Intra-op Plan:   Post-operative Plan: Extubation in OR  Informed Consent: I have reviewed the patients History and Physical, chart, labs and discussed the procedure including the risks, benefits and alternatives for the proposed anesthesia with the patient or authorized representative who has indicated his/her understanding and acceptance.     Dental advisory given  Plan Discussed with:   Anesthesia Plan Comments: (See PAT note 05/05/2021, Konrad Felix Ward, PA-C)       Anesthesia Quick Evaluation

## 2021-05-22 NOTE — Op Note (Signed)
05/20/2021 - 05/22/2021  6:58 PM  PATIENT:  [name] [name]  81 y.o. male  Patient Care Team: Caryl Bis, MD as PCP - General (Unknown Physician Specialty) Satira Sark, MD as PCP - Cardiology (Cardiology) Derek Jack, MD as Medical Oncologist (Medical Oncology)  PRE-OPERATIVE DIAGNOSIS:  hemoperitonuem  POST-OPERATIVE DIAGNOSIS:  hemoperitonuem  PROCEDURE:  Procedure(s): LAPAROSCOPY DIAGNOSTIC, evacuation and washout  SURGEON:  Surgeon(s): Leighton Ruff, MD Armandina Gemma, MD  ASSISTANT: Dr Harlow Asa   ANESTHESIA:   general  EBL:  Total I/O In: 907.3 [I.V.:116.5; Blood:690.8; IV Piggyback:100] Out: 1908 [Urine:1898; Blood:10]  DRAINS: none   SPECIMEN:  No Specimen  DISPOSITION OF SPECIMEN:  N/A  COUNTS:  YES  PLAN OF CARE:  Pt already admitted  PATIENT DISPOSITION:  PACU - hemodynamically stable.  INDICATION: 81 year old male who became hypotensive in the postoperative period after extended right colectomy.  He initially stabilized with transfusion and fluid resuscitation.  His hemoglobin has trended down over the past 48 hours and therefore we decided to proceed with diagnostic laparoscopy to ensure no active bleeding that could be surgically controlled.   OR FINDINGS: Significant hemoperitoneum with clot noted around the root of the mesentery and in the pelvis.  No active bleeding noted upon visualized surfaces.  DESCRIPTION: the patient was identified in the preoperative holding area and taken to the OR where they were laid supine on the operating room table.  General anesthesia was induced without difficulty. SCDs were also noted to be in place prior to the initiation of anesthesia.  The patient was then prepped and draped in the usual sterile fashion.   A surgical timeout was performed indicating the correct patient, procedure, positioning and need for preoperative antibiotics.   I began by incising one of his left upper quadrant port sites and  gently inserted a trocar into the incision.  I insufflated the abdomen to approximately 15 mmHg.  Several other 5 mm ports were placed through previous incisions.  We began by evacuating hematoma around the anastomosis and omentum.  I then continued up into the right upper quadrant removing hematoma and evaluating for active bleeding.  There appeared to be some bright or blood in the root of the mesentery but upon irrigation there did not appear to be any active arterial bleeding noted.  I continued down into the pelvis and allow the patient to be placed in Trendelenburg.  There was a large amount of clot in the pelvis which was also evacuated using a suction device.  There were several areas of clot within loops of small bowel.  These were all evacuated.  There was a small amount of clot along the right paracolic gutter.  There was no clot noted in the left upper quadrant or left lower quadrant.  We irrigated the abdomen with approximately 3 L of normal saline.  No further active bleeding could be noted.  The small bowel was relatively distended and therefore we were unable to visualize all raw surfaces with great detail.  I did not feel that there were any sources for ongoing bleeding that would require an open incision with closer inspection of the entire mesenteric root.  The irrigant effluent was relatively clear.  At this point we decided to conclude the operation.  The ports were removed and the pneumoperitoneum was evacuated.  Marcaine mixed with Exparel was placed into the subcutaneous tissue around the port sites.  The ports were then closed using 4-0 Vicryl sutures and Dermabond.  The patient  was then awakened from anesthesia and sent to the postanesthesia care unit in stable condition.  All counts were correct per operating room staff.  Rosario Adie, MD  [facility]

## 2021-05-22 NOTE — Progress Notes (Addendum)
NAME:  [name] [name], MRN:  [id number], DOB:  1940/10/28, LOS: 2 ADMISSION DATE:  05/20/2021, CONSULTATION DATE: May 20, 2021 REFERRING MD: General surgery, CHIEF COMPLAINT: Postop hypertension History of Present Illness:   Patient is 81 year old Caucasian male , had robotic transverse right-sided colectomy 2/24 came out of the OR hypotensive H&H check showed that that he dropped from around 12-7.8 was brought down to the ICU for pressors use we were consulted for persistent hypotension requiring 2 pressors and severe lactic acidosis, AKI  PMH: coronary artery disease  A-fib all anticoagulation antiplatelets have been stopped for few days.   Significant tests/ events reviewed 2/25 transfuse 4 units PRBC , pressors tapered to off CT abdomen/pelvis shows hemoperitoneum Echo showed hyperdynamic LV, HOCM physiology, density along the RV outflow tract, which on review with second cardiologist appears artifactual, does not appear to be embolus or vegetation   SUBJ : Weaned off pressors over last 24 hours. Remains critically ill, acidosis improved Afebrile Denies pain, urine output improving   Objective   Blood pressure (!) 131/39, pulse 65, temperature 98.1 F (36.7 C), temperature source Oral, resp. rate 18, height 5\' 11"  (1.803 m), weight 108.9 kg, SpO2 95 %.        Intake/Output Summary (Last 24 hours) at 05/22/2021 0958 Last data filed at 05/22/2021 0900 Gross per 24 hour  Intake 2185.78 ml  Output 720 ml  Net 1465.78 ml    Filed Weights   05/20/21 1023 05/21/21 0600 05/22/21 0500  Weight: 100.7 kg 105.3 kg 108.9 kg    Examination: General: Appears younger than stated age, no distress Neuro: Grossly nonfocal, alert oriented x3  HEENT:  atraumatic , no jaundice , dry mucous membranes  Cardiovascular: S1-S2 regular, ESM 2/6 at base Lungs: Clear lungs, no accessory muscle use Abdomen: Dressing intact, soft, no tenderness, no guarding or rigidity Musculoskeletal:  WNL  , normal pulses  Skin: Ecchymosis over right flank  Chest x-ray 2/24 independently reviewed shows new central line in position, no effusions or infiltrates. CT abdomen/pelvis reviewed  Labs show creatinine plateauing at 2.95, mild hyponatremia, persistent leukocytosis, drop in hemoglobin From 9.4-7.0  Assessment & Plan:    Hemorrhagic shock  -Hemoperitoneum -In hindsight appears to be hemorrhagic shock with HOCM physiology -Lactic acidosis resolving and came off pressors with benign abdominal exam so unlikely to be dead gut -Monitor hemoglobin every 6 ,  drop is concerning for ongoing bleeding  Thrombocytopenia -dilutional coagulopathy, will transfuse 1 unit and 2 units of FFP  AKI with metabolic/lactic acidosis --Monitor urine output , improving and this is encouraging -Creatinine has plateaued and expect to improve -Discontinue bicarb drip   Diabetes type 2 -Hold metformin - SSI  CAD, hypertension Paroxysmal atrial fibrillation with Elevated troponin, flat -EKGs  showed Q waves in inferior leads which are old, no new ST-T wave changes -Holding apixaban, Cardizem, metoprolol, benazepril    Best practice DVT prophylaxis -SCDs, hold Lovenox Goals of care: Full CODE STATUS currently, wife updated at bedside  Labs   CBC: Recent Labs  Lab 05/20/21 2009 05/21/21 0417 05/21/21 0418 05/21/21 1158 05/22/21 0343  WBC 12.9*  --  26.4*  --  15.5*  HGB 9.5* 10.6* 10.5* 9.4* 7.0*  HCT 29.1* 32.1* 32.1* 27.6* 20.2*  MCV 92.1  --  92.8  --  86.7  PLT 128*  --  116*  --  56*     Basic Metabolic Panel: Recent Labs  Lab 05/20/21 1944 05/20/21 2145 05/21/21 0417 05/21/21 0418  05/21/21 1617 05/22/21 0343  NA 139 134*  --  132* 130* 130*  K 3.6 3.8  --  4.2 3.9 3.8  CL  --  110  --  103 99 96*  CO2  --  14*  --  11* 19* 27  GLUCOSE  --  278*  --  288* 291* 130*  BUN  --  19  --  21 32* 38*  CREATININE  --  1.85*  --  2.44* 2.80* 2.95*  CALCIUM  --  8.1*  --  7.9*  7.3* 7.6*  MG  --  1.6*  --   --   --  2.0  PHOS  --   --  9.0*  --   --  5.2*    GFR: Estimated Creatinine Clearance: 25.1 mL/min (A) (by C-G formula based on SCr of 2.95 mg/dL (H)). Recent Labs  Lab 05/20/21 2009 05/20/21 2146 05/21/21 0417 05/21/21 0418 05/21/21 0822 05/21/21 1158 05/21/21 1616 05/22/21 0343  WBC 12.9*  --   --  26.4*  --   --   --  15.5*  LATICACIDVEN  --    < > >9.0*  --  8.8* 7.5* 5.6*  --    < > = values in this interval not displayed.     Liver Function Tests: No results for input(s): AST, ALT, ALKPHOS, BILITOT, PROT, ALBUMIN in the last 168 hours. No results for input(s): LIPASE, AMYLASE in the last 168 hours. No results for input(s): AMMONIA in the last 168 hours.  ABG    Component Value Date/Time   PHART 7.44 05/22/2021 0334   PCO2ART 37 05/22/2021 0334   PO2ART 76 (L) 05/22/2021 0334   HCO3 25.2 05/22/2021 0334   TCO2 17 (L) 05/20/2021 1944   ACIDBASEDEF 7.2 (H) 05/21/2021 1532   O2SAT 99.6 05/22/2021 0334      Coagulation Profile: Recent Labs  Lab 05/20/21 1545 05/20/21 2009  INR 1.2 1.4*     Cardiac Enzymes: No results for input(s): CKTOTAL, CKMB, CKMBINDEX, TROPONINI in the last 168 hours.  HbA1C: Hgb A1c MFr Bld  Date/Time Value Ref Range Status  05/05/2021 10:43 AM 6.5 (H) 4.8 - 5.6 % Final    Comment:    (NOTE) Pre diabetes:          5.7%-6.4%  Diabetes:              >6.4%  Glycemic control for   <7.0% adults with diabetes   12/31/2017 11:15 PM 7.5 (H) 4.8 - 5.6 % Final    Comment:    (NOTE) Pre diabetes:          5.7%-6.4% Diabetes:              >6.4% Glycemic control for   <7.0% adults with diabetes     CBG: Recent Labs  Lab 05/21/21 1615 05/21/21 1947 05/21/21 2336 05/22/21 0340 05/22/21 0801  GLUCAP 260* 255* 172* 130* 160*        The patient is critically ill with multiple organ systems failure and requires high complexity decision making for assessment and support, frequent evaluation  and titration of therapies, application of advanced monitoring technologies and extensive interpretation of multiple databases. Critical Care Time devoted to patient care services described in this note independent of APP/resident  time is 40 minutes.    Kara Mead MD. [facility] Flood. [facility] Pager : [phone]  If no response to pager , please call [phone] until 7 pm After  7:00 pm call Elink  [phone]   05/22/2021

## 2021-05-22 NOTE — Progress Notes (Signed)
° ° ° ° °  Discussion with family, ICU staff, Dr. Marcello Moores, and Dr. Christella Hartigan from anesthesia.  Given significant drop in hemoglobin this AM to 7.0, will plan to transfuse 2 U PRBC's and plasma now.  Make patient NPO.  Will proceed to OR approx 5PM today for laparoscopy and evacuation of hemoperitoneum and hopefully control of source of bleeding.  Dr. Marcello Moores will come in to perform procedure and I will assist.  Possibility of conversion to open surgery.  Orders entered and all parties aware of plans.  Armandina Gemma, MD [facility] [place] [facility] Office: [phone]

## 2021-05-23 ENCOUNTER — Encounter (HOSPITAL_COMMUNITY): Payer: Self-pay | Admitting: General Surgery

## 2021-05-23 DIAGNOSIS — N179 Acute kidney failure, unspecified: Secondary | ICD-10-CM | POA: Diagnosis not present

## 2021-05-23 DIAGNOSIS — R578 Other shock: Secondary | ICD-10-CM | POA: Diagnosis not present

## 2021-05-23 LAB — CBC
HCT: 22.5 % — ABNORMAL LOW (ref 39.0–52.0)
HCT: 23.9 % — ABNORMAL LOW (ref 39.0–52.0)
Hemoglobin: 7.7 g/dL — ABNORMAL LOW (ref 13.0–17.0)
Hemoglobin: 8.2 g/dL — ABNORMAL LOW (ref 13.0–17.0)
MCH: 30.2 pg (ref 26.0–34.0)
MCH: 30.7 pg (ref 26.0–34.0)
MCHC: 34.2 g/dL (ref 30.0–36.0)
MCHC: 34.3 g/dL (ref 30.0–36.0)
MCV: 88.2 fL (ref 80.0–100.0)
MCV: 89.5 fL (ref 80.0–100.0)
Platelets: 49 10*3/uL — ABNORMAL LOW (ref 150–400)
Platelets: 54 10*3/uL — ABNORMAL LOW (ref 150–400)
RBC: 2.55 MIL/uL — ABNORMAL LOW (ref 4.22–5.81)
RBC: 2.67 MIL/uL — ABNORMAL LOW (ref 4.22–5.81)
RDW: 15.5 % (ref 11.5–15.5)
RDW: 15.7 % — ABNORMAL HIGH (ref 11.5–15.5)
WBC: 15.4 10*3/uL — ABNORMAL HIGH (ref 4.0–10.5)
WBC: 19 10*3/uL — ABNORMAL HIGH (ref 4.0–10.5)
nRBC: 0.3 % — ABNORMAL HIGH (ref 0.0–0.2)
nRBC: 0.4 % — ABNORMAL HIGH (ref 0.0–0.2)

## 2021-05-23 LAB — GLUCOSE, CAPILLARY
Glucose-Capillary: 183 mg/dL — ABNORMAL HIGH (ref 70–99)
Glucose-Capillary: 170 mg/dL — ABNORMAL HIGH (ref 70–99)
Glucose-Capillary: 204 mg/dL — ABNORMAL HIGH (ref 70–99)
Glucose-Capillary: 170 mg/dL — ABNORMAL HIGH (ref 70–99)
Glucose-Capillary: 150 mg/dL — ABNORMAL HIGH (ref 70–99)
Glucose-Capillary: 138 mg/dL — ABNORMAL HIGH (ref 70–99)

## 2021-05-23 LAB — PREPARE FRESH FROZEN PLASMA

## 2021-05-23 LAB — BASIC METABOLIC PANEL
Anion gap: 5 (ref 5–15)
BUN: 39 mg/dL — ABNORMAL HIGH (ref 8–23)
CO2: 28 mmol/L (ref 22–32)
Calcium: 8 mg/dL — ABNORMAL LOW (ref 8.9–10.3)
Chloride: 102 mmol/L (ref 98–111)
Creatinine, Ser: 2.13 mg/dL — ABNORMAL HIGH (ref 0.61–1.24)
GFR, Estimated: 31 mL/min — ABNORMAL LOW (ref >60)
Glucose, Bld: 199 mg/dL — ABNORMAL HIGH (ref 70–99)
Potassium: 3.9 mmol/L (ref 3.5–5.1)
Sodium: 135 mmol/L (ref 135–145)

## 2021-05-23 LAB — PREPARE PLATELET PHERESIS: Unit division: 0

## 2021-05-23 LAB — BPAM FFP
Blood Product Expiration Date: 202303022359
ISSUE DATE / TIME: 202302250130
Unit Type and Rh: 5100

## 2021-05-23 LAB — PROTIME-INR
INR: 1.4 — ABNORMAL HIGH (ref 0.8–1.2)
Prothrombin Time: 17.1 seconds — ABNORMAL HIGH (ref 11.4–15.2)

## 2021-05-23 LAB — BPAM PLATELET PHERESIS
Blood Product Expiration Date: 202302262359
Unit Type and Rh: 600

## 2021-05-23 MED ORDER — ORAL CARE MOUTH RINSE
15.0000 mL | Freq: Two times a day (BID) | OROMUCOSAL | Status: DC
  Administered 2021-05-22 – 2021-05-25 (×7): 15 mL via OROMUCOSAL

## 2021-05-23 MED ORDER — SODIUM CHLORIDE 0.9 % IV SOLN
250.0000 mL | INTRAVENOUS | Status: DC
  Administered 2021-05-23 – 2021-05-25 (×4): 250 mL via INTRAVENOUS

## 2021-05-23 NOTE — Plan of Care (Signed)
°  Problem: Education: Goal: Knowledge of General Education information will improve Description: Including pain rating scale, medication(s)/side effects and non-pharmacologic comfort measures Outcome: Progressing   Problem: Health Behavior/Discharge Planning: Goal: Ability to manage health-related needs will improve Outcome: Progressing   Problem: Clinical Measurements: Goal: Ability to maintain clinical measurements within normal limits will improve Outcome: Progressing Goal: Will remain free from infection Outcome: Progressing Goal: Diagnostic test results will improve Outcome: Progressing Goal: Respiratory complications will improve Outcome: Progressing Goal: Cardiovascular complication will be avoided Outcome: Progressing   Problem: Activity: Goal: Risk for activity intolerance will decrease Outcome: Progressing   Problem: Nutrition: Goal: Adequate nutrition will be maintained Outcome: Progressing   Problem: Coping: Goal: Level of anxiety will decrease Outcome: Progressing   Problem: Pain Managment: Goal: General experience of comfort will improve Outcome: Progressing   Problem: Safety: Goal: Ability to remain free from injury will improve Outcome: Progressing   Problem: Skin Integrity: Goal: Risk for impaired skin integrity will decrease Outcome: Progressing   Cindy S. Brigitte Pulse BSN, RN, [place] 05/23/2021 3:19 AM

## 2021-05-23 NOTE — Progress Notes (Signed)
1 Day Post-Op lap washout Subjective: Feeling much better, passing min flatus  Objective: Vital signs in last 24 hours: Temp:  [97.5 F (36.4 C)-98.7 F (37.1 C)] 98.4 F (36.9 C) (02/27 0756) Pulse Rate:  [61-84] 66 (02/27 0700) Resp:  [9-22] 13 (02/27 0700) BP: (149-210)/(48-82) 165/51 (02/27 0700) SpO2:  [92 %-100 %] 92 % (02/27 0700) Arterial Line BP: (149-188)/(50-56) 188/51 (02/26 1324) Weight:  [109.3 kg] 109.3 kg (02/27 0417)   Intake/Output from previous day: 02/26 0701 - 02/27 0700 In: 1816 [I.V.:816.5; Blood:899.5; IV Piggyback:100] Out: 8338 [Urine:4293; Blood:10] Intake/Output this shift: No intake/output data recorded.   General appearance: alert and cooperative GI: soft, distended  Incision: no significant drainage  Lab Results:  Recent Labs    05/22/21 1950 05/23/21 0408  WBC 16.7* 15.4*  HGB 8.4* 7.7*  HCT 24.8* 22.5*  PLT 54* 49*   BMET Recent Labs    05/22/21 0343 05/23/21 0408  NA 130* 135  K 3.8 3.9  CL 96* 102  CO2 27 28  GLUCOSE 130* 199*  BUN 38* 39*  CREATININE 2.95* 2.13*  CALCIUM 7.6* 8.0*   PT/INR Recent Labs    05/20/21 1545 05/20/21 2009  LABPROT 14.7 17.0*  INR 1.2 1.4*   ABG Recent Labs    05/21/21 1532 05/22/21 0334  PHART 7.34* 7.44  HCO3 17.8* 25.2    MEDS, Scheduled  acetaminophen  1,000 mg Oral Q6H   alvimopan  12 mg Oral BID   Chlorhexidine Gluconate Cloth  6 each Topical Daily   dorzolamide  1 drop Both Eyes BID   insulin aspart  0-15 Units Subcutaneous Q4H   latanoprost  1 drop Both Eyes QHS   mouth rinse  15 mL Mouth Rinse BID   pantoprazole (PROTONIX) IV  40 mg Intravenous Q24H   saccharomyces boulardii  250 mg Oral BID    Studies/Results: CT ABDOMEN PELVIS WO CONTRAST  Result Date: 05/21/2021 CLINICAL DATA:  Postop abdominal pain with hypotension. EXAM: CT ABDOMEN AND PELVIS WITHOUT CONTRAST TECHNIQUE: Multidetector CT imaging of the abdomen and pelvis was performed following the standard  protocol without IV contrast. RADIATION DOSE REDUCTION: This exam was performed according to the departmental dose-optimization program which includes automated exposure control, adjustment of the mA and/or kV according to patient size and/or use of iterative reconstruction technique. COMPARISON:  03/18/2021 FINDINGS: Lower chest: Small pericardial effusion and pleural effusions with dependent atelectasis. Distended lower esophagus and conjunction with gas and fluid distended stomach. Hepatobiliary: No focal liver abnormality.No evidence of biliary obstruction or stone. Pancreas: Generalized atrophy Spleen: Unremarkable. Adrenals/Urinary Tract: Negative adrenals. No hydronephrosis or stone. Symmetric renal atrophy. High-density 8 mm nodule at the left lower pole, likely hemorrhagic cyst. Punctate left renal calculus. Foley catheter with collapsed bladder. Stomach/Bowel: Right hemicolectomy. Diffuse hemoperitoneum present throughout the interloop spaces and greatest at the pelvis, which limits localization of the source. Gas and fluid distended stomach extending up the esophagus. No pneumatosis or portal venous gas. Vascular/Lymphatic: Confluent atheromatous calcification of the aorta. No mass or adenopathy. Reproductive:Prostate brachytherapy seeds. Other: In addition to the hemoperitoneum there is more fluid density around the liver which is small volume. Expected postoperative pneumoperitoneum. Extensive the abdominal wall gas and swelling, no focal hematoma. Fatty enlargement of the bilateral inguinal canal. Musculoskeletal: No acute abnormalities. IMPRESSION: 1. Diffuse interloop and pelvic hemoperitoneum. 2. Extensive abdominal wall swelling. 3. Gas and fluid distended stomach with fluid refluxing up the descending lower esophagus. 4. Atelectasis and small pleural effusions. Electronically Signed  By: Jorje Guild M.D.   On: 05/21/2021 10:47   ECHOCARDIOGRAM COMPLETE  Addendum Date: 05/21/2021   Asked  to review study by ordering team regarding findings in region of the RVOT and pulmonic valve. There is bubble contrast in this region (likely from peripheral IV) and most likely the mobile density is secondary to artifact. Images that follow in the  absence of bubble contrast do not show a consistent abnormality to suggest definitive thrombus or vegatation. Pericardial effusion is moderate posteriorly and small anteriorly. Rozann Lesches MD Electronically Amended 05/21/2021, 2:18 PM   Final Loreli Dollar)    Result Date: 05/21/2021    ECHOCARDIOGRAM REPORT   Patient Name:   [name] [name] Date of Exam: 05/21/2021 Medical Rec #:  [id number]     Height:       71.0 in Accession #:    [id number]    Weight:       232.1 lb Date of Birth:  Jan 29, 1941     BSA:          2.246 m Patient Age:    81 years      BP:           166/61 mmHg Patient Gender: M             HR:           74 bpm. Exam Location:  Inpatient Procedure: 2D Echo, Color Doppler and Cardiac Doppler Indications:     Post-op shock, ?cardiogenic  History:         Patient has prior history of Echocardiogram examinations, most                  recent 03/05/2019. CAD, Arrythmias:Atrial Fibrillation; Risk                  Factors:Hypertension, Diabetes and Dyslipidemia.  Sonographer:     Raquel Sarna Senior RDCS Referring Phys:  [id number] ALVA Diagnosing Phys: Fransico Him MD  Sonographer Comments: Technically difficult study with poor apical and subcostal windows. IMPRESSIONS  1. Left ventricular ejection fraction, by estimation, is 70 to 75%. The left ventricle has hyperdynamic function. The left ventricle has no regional wall motion abnormalities. There is severe concentric left ventricular hypertrophy. There is a resting LVOT gradient of 3mmHg. Findings suggestive of HOCM.     Left ventricular diastolic parameters are consistent with Grade I diastolic dysfunction (impaired relaxation). Elevated left ventricular end-diastolic pressure.  2. Right ventricular systolic  function is normal. The right ventricular size is normal. Tricuspid regurgitation signal is inadequate for assessing PA pressure.  3. The mitral valve is normal in structure. No evidence of mitral valve regurgitation. No evidence of mitral stenosis.  4. The aortic valve is calcified. Aortic valve regurgitation is not visualized. Aortic valve sclerosis/calcification is present, without any evidence of aortic stenosis. Aortic valve area, by VTI measures 3.17 cm. Aortic valve mean gradient measures 9.0 mmHg. Aortic valve Vmax measures 2.10 m/s.  5. There is a mobile density in the RVOT in the vicinity of the pulmonic valve. Cannot rule out vegetation vs thrombus by this study.  6. Aortic dilatation noted. There is borderline dilatation of the aortic root, measuring 37 mm. There is borderline dilatation of the ascending aorta, measuring 37 mm.  7. The inferior vena cava is normal in size with greater than 50% respiratory variability, suggesting right atrial pressure of 3 mmHg.  8. A small pericardial effusion is present. The pericardial effusion is circumferential. There is no  evidence of cardiac tamponade.  9. Consider Chest CTA to rule out PE an if negative consider TEE to assess RVOT and PV further given mobile target if endocarditis if a concern. FINDINGS  Left Ventricle: There is a resting LVOT gradient of 98mmHg. Findings suggestive of HOCM. Left ventricular ejection fraction, by estimation, is 70 to 75%. The left ventricle has hyperdynamic function. The left ventricle has no regional wall motion abnormalities. The left ventricular internal cavity size was small. There is severe concentric left ventricular hypertrophy. Left ventricular diastolic parameters are consistent with Grade I diastolic dysfunction (impaired relaxation). Elevated left ventricular end-diastolic pressure. Right Ventricle: The right ventricular size is normal. No increase in right ventricular wall thickness. Right ventricular systolic  function is normal. Tricuspid regurgitation signal is inadequate for assessing PA pressure. Left Atrium: Left atrial size was normal in size. Right Atrium: Right atrial size was normal in size. Pericardium: A small pericardial effusion is present. The pericardial effusion is circumferential. There is no evidence of cardiac tamponade. Presence of epicardial fat layer. Mitral Valve: The mitral valve is normal in structure. Mild to moderate mitral annular calcification. No evidence of mitral valve regurgitation. No evidence of mitral valve stenosis. Tricuspid Valve: The tricuspid valve is normal in structure. Tricuspid valve regurgitation is not demonstrated. No evidence of tricuspid stenosis. Aortic Valve: The aortic valve is calcified. Aortic valve regurgitation is not visualized. Aortic valve sclerosis/calcification is present, without any evidence of aortic stenosis. Aortic valve mean gradient measures 9.0 mmHg. Aortic valve peak gradient measures 17.6 mmHg. Aortic valve area, by VTI measures 3.17 cm. Pulmonic Valve: There is a mobile density in the RVOT in the vicinity of the pulmonic valve. Cannot rule out vegetation. The pulmonic valve was normal in structure. Pulmonic valve regurgitation is trivial. No evidence of pulmonic stenosis. Aorta: Aortic dilatation noted. There is borderline dilatation of the aortic root, measuring 37 mm. There is borderline dilatation of the ascending aorta, measuring 37 mm. Venous: The inferior vena cava is normal in size with greater than 50% respiratory variability, suggesting right atrial pressure of 3 mmHg. IAS/Shunts: No atrial level shunt detected by color flow Doppler.  LEFT VENTRICLE PLAX 2D LVIDd:         2.60 cm   Diastology LVIDs:         1.60 cm   LV e' medial:    4.57 cm/s LV PW:         2.10 cm   LV E/e' medial:  15.5 LV IVS:        2.00 cm   LV e' lateral:   4.24 cm/s LVOT diam:     2.20 cm   LV E/e' lateral: 16.7 LV SV:         89 LV SV Index:   40 LVOT Area:      3.80 cm  RIGHT VENTRICLE RV S prime:     15.40 cm/s TAPSE (M-mode): 2.4 cm LEFT ATRIUM            Index        RIGHT ATRIUM           Index LA diam:      3.70 cm  1.65 cm/m   RA Area:     13.60 cm LA Vol (A4C): 133.0 ml 59.20 ml/m  RA Volume:   28.00 ml  12.46 ml/m  AORTIC VALVE AV Area (Vmax):    2.52 cm AV Area (Vmean):   2.76 cm AV Area (VTI):  3.17 cm AV Vmax:           210.00 cm/s AV Vmean:          139.000 cm/s AV VTI:            0.282 m AV Peak Grad:      17.6 mmHg AV Mean Grad:      9.0 mmHg LVOT Vmax:         139.00 cm/s LVOT Vmean:        101.000 cm/s LVOT VTI:          0.235 m LVOT/AV VTI ratio: 0.83  AORTA Ao Root diam: 3.70 cm Ao Asc diam:  3.70 cm MITRAL VALVE MV Area (PHT): 1.68 cm     SHUNTS MV Decel Time: 451 msec     Systemic VTI:  0.24 m MV E velocity: 71.00 cm/s   Systemic Diam: 2.20 cm MV A velocity: 116.00 cm/s MV E/A ratio:  0.61 Fransico Him MD Electronically signed by Fransico Him MD Signature Date/Time: 05/21/2021/11:43:40 AM    Final (Amended)    VAS [name] LOWER EXTREMITY VENOUS (DVT)  Result Date: 05/22/2021  Lower Venous DVT Study Patient Name:  MILBERT BIXLER  Date of Exam:   05/21/2021 Medical Rec #: [id number]      Accession #:    [id number] Date of Birth: 11-18-1940      Patient Gender: M Patient Age:   81 years Exam Location:  [facility] Procedure:      VAS [name] LOWER EXTREMITY VENOUS (DVT) Referring Phys: [facility] ALVA --------------------------------------------------------------------------------  Indications: Pain, and Patient removed from anticoagulation secondary to bleed.  Comparison Study: No Prior study Performing Technologist: Sharion Dove RVS  Examination Guidelines: A complete evaluation includes B-mode imaging, spectral Doppler, color Doppler, and power Doppler as needed of all accessible portions of each vessel. Bilateral testing is considered an integral part of a complete examination. Limited examinations for reoccurring indications may be performed  as noted. The reflux portion of the exam is performed with the patient in reverse Trendelenburg.  +---------+---------------+---------+-----------+----------+--------------+  RIGHT     Compressibility Phasicity Spontaneity Properties Thrombus Aging  +---------+---------------+---------+-----------+----------+--------------+  CFV       Full            Yes       Yes                                    +---------+---------------+---------+-----------+----------+--------------+  SFJ       Full                                                             +---------+---------------+---------+-----------+----------+--------------+  FV Prox   Full            Yes       Yes                                    +---------+---------------+---------+-----------+----------+--------------+  FV Mid    Full            Yes       Yes                                    +---------+---------------+---------+-----------+----------+--------------+  FV Distal Full            Yes       Yes                                    +---------+---------------+---------+-----------+----------+--------------+  PFV       Full                                                             +---------+---------------+---------+-----------+----------+--------------+  POP       Full            Yes       Yes                                    +---------+---------------+---------+-----------+----------+--------------+  PTV       Full                                                             +---------+---------------+---------+-----------+----------+--------------+  PERO      Full                                                             +---------+---------------+---------+-----------+----------+--------------+  GSV       Full                                                             +---------+---------------+---------+-----------+----------+--------------+   +---------+---------------+---------+-----------+----------+--------------+  LEFT       Compressibility Phasicity Spontaneity Properties Thrombus Aging  +---------+---------------+---------+-----------+----------+--------------+  CFV       Full            Yes       Yes                                    +---------+---------------+---------+-----------+----------+--------------+  SFJ       Full                                                             +---------+---------------+---------+-----------+----------+--------------+  FV Prox   Full            Yes       Yes                                    +---------+---------------+---------+-----------+----------+--------------+  FV Mid    Full            Yes       Yes                                    +---------+---------------+---------+-----------+----------+--------------+  FV Distal Full            Yes       Yes                                    +---------+---------------+---------+-----------+----------+--------------+  PFV       Full                                                             +---------+---------------+---------+-----------+----------+--------------+  POP       Full            Yes       Yes                                    +---------+---------------+---------+-----------+----------+--------------+  PTV       Full                                                             +---------+---------------+---------+-----------+----------+--------------+  PERO      Full                                                             +---------+---------------+---------+-----------+----------+--------------+     Summary: BILATERAL: - No evidence of deep vein thrombosis seen in the lower extremities, bilaterally. -No evidence of popliteal cyst, bilaterally.   *See table(s) above for measurements and observations. Electronically signed by Monica Martinez MD on 05/22/2021 at 10:26:54 AM.    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   Iron deficiency anemia 09/20/2020   CAD (coronary artery disease) 07/29/2018   Status post coronary artery stent placement    Hyperlipidemia 01/02/2018   Hx of NSTEMI in 10/209 tx with DES to LCx 12/31/2017   PAF (paroxysmal atrial fibrillation) (HCC)    Type 2 diabetes mellitus with diabetic chronic kidney disease ([place]) 04/12/2016   Type 2 or unspecified type diabetes mellitus, uncontrolled 12/08/2015   Sinus bradycardia 01/14/2014   Pericardial effusion 12/31/2013   Paroxysmal atrial fibrillation (HCC)    Supraventricular tachycardia ([place]) 10/17/2010    Pt with post op bleed, s/p transfusions and washout of hematoma  Plan: Cont NPO today with  sips of clears Out of bed Recheck cbc and coags this afternoon Thrombocytopenia hasn't improved much after transfusion but no signs of active bleeding currently Monitor closely in ICU today  LOS: 3 days     .Rosario Adie, MD [facility] Surgery, [place]    05/23/2021 8:53 AM

## 2021-05-23 NOTE — Evaluation (Signed)
Physical Therapy Evaluation Patient Details Name: [name] [name] MRN: [id number] DOB: Feb 20, 1941 Today's Date: 05/23/2021  History of Present Illness  Pt is an 81 year old Caucasian male who had robotic transverse right-sided colectomy 2/24 came out of the OR hypotensive, H&H check showed that that he dropped from around 12-7.8 was brought down to the ICU for pressors.  Pt had post op bleed, s/p transfusions and washout of hematoma.  PMHx: afib, NSTEMI, HTN, SVT, DM, anemia, glaucoma  Clinical Impression  Pt admitted with above diagnosis. Pt currently with functional limitations due to the deficits listed below (see PT Problem List). Pt will benefit from skilled PT to increase their independence and safety with mobility to allow discharge to the venue listed below.   Pt assisted with ambulating in hallway and tolerated 150 feet with RW.  Pt's spouse in room and has been providing care as well (spouse was a caregiver).  Pt typically uses cane however spouse believes they do have RW at home.  Pt likely to progress to d/c home.        Recommendations for follow up therapy are one component of a multi-disciplinary discharge planning process, led by the attending physician.  Recommendations may be updated based on patient status, additional functional criteria and insurance authorization.  Follow Up Recommendations Home health PT    Assistance Recommended at Discharge Set up Supervision/Assistance  Patient can return home with the following  A little help with walking and/or transfers;A little help with bathing/dressing/bathroom;Help with stairs or ramp for entrance;Assistance with cooking/housework    Equipment Recommendations None recommended by PT  Recommendations for Other Services       Functional Status Assessment Patient has had a recent decline in their functional status and demonstrates the ability to make significant improvements in function in a reasonable and predictable amount of  time.     Precautions / Restrictions Precautions Precautions: Fall Precaution Comments: monitor sats      Mobility  Bed Mobility               General bed mobility comments: pt in recliner    Transfers Overall transfer level: Needs assistance Equipment used: Rolling walker (2 wheels) Transfers: Sit to/from Stand Sit to Stand: Min assist           General transfer comment: assist to rise and stedy, cues for positioning and hand placement; assist to rise and control descent    Ambulation/Gait Ambulation/Gait assistance: Min assist Gait Distance (Feet): 150 Feet Assistive device: Rolling walker (2 wheels) Gait Pattern/deviations: Decreased stride length, Step-through pattern Gait velocity: decr     General Gait Details: slow but steady with RW, cues for posture, SpO2 in mid 80s room air however finger monitor holding RW and SpO2 90% on room air while pt not gripping; SPO2 95% on room air upon returning to recliner; pt denies any symptoms with mobilizing  Stairs            Wheelchair Mobility    Modified Rankin (Stroke Patients Only)       Balance Overall balance assessment: Needs assistance         Standing balance support: Bilateral upper extremity supported, Reliant on assistive device for balance Standing balance-Leahy Scale: Poor                               Pertinent Vitals/Pain Pain Assessment Pain Assessment: Faces Faces Pain Scale: Hurts a little bit Pain Location:  left abdomen Pain Descriptors / Indicators: Aching Pain Intervention(s): Repositioned, Monitored during session    [place] expects to be discharged to:: Private residence Living Arrangements: Spouse/significant other   Type of Home: House Home Access: Stairs to enter Entrance Stairs-Rails: Right Entrance Stairs-Number of Steps: 3-4   Home Layout: One level Home Equipment: Conservation officer, nature (2 wheels);Cane - single point      Prior  Function Prior Level of Function : Independent/Modified Independent             Mobility Comments: typically uses SPC       Hand Dominance        Extremity/Trunk Assessment        Lower Extremity Assessment Lower Extremity Assessment: Generalized weakness    Cervical / Trunk Assessment Cervical / Trunk Assessment: Normal  Communication   Communication: HOH  Cognition Arousal/Alertness: Awake/alert Behavior During Therapy: WFL for tasks assessed/performed Overall Cognitive Status: Within Functional Limits for tasks assessed                                          General Comments      Exercises     Assessment/Plan    PT Assessment Patient needs continued PT services  PT Problem List Decreased strength;Decreased activity tolerance;Decreased balance;Decreased mobility;Decreased knowledge of use of DME       PT Treatment Interventions Gait training;DME instruction;Therapeutic exercise;Balance training;Stair training;Functional mobility training;Therapeutic activities;Patient/family education    PT Goals (Current goals can be found in the Care Plan section)  Acute Rehab PT Goals PT Goal Formulation: With patient Time For Goal Achievement: 06/06/21 Potential to Achieve Goals: Good    Frequency Min 3X/week     Co-evaluation               AM-PAC PT "6 Clicks" Mobility  Outcome Measure Help needed turning from your back to your side while in a flat bed without using bedrails?: A Little Help needed moving from lying on your back to sitting on the side of a flat bed without using bedrails?: A Little Help needed moving to and from a bed to a chair (including a wheelchair)?: A Little Help needed standing up from a chair using your arms (e.g., wheelchair or bedside chair)?: A Little Help needed to walk in hospital room?: A Little Help needed climbing 3-5 steps with a railing? : A Lot 6 Click Score: 17    End of Session Equipment  Utilized During Treatment: Gait belt Activity Tolerance: Patient tolerated treatment well Patient left: in chair;with call bell/phone within reach;with family/visitor present Nurse Communication: Mobility status PT Visit Diagnosis: Difficulty in walking, not elsewhere classified (R26.2)    Time: 6734-1937 PT Time Calculation (min) (ACUTE ONLY): 29 min   Charges:   PT Evaluation $PT Eval Low Complexity: 1 Low PT Treatments $Gait Training: 8-22 mins      Jannette Spanner PT, DPT Acute Rehabilitation Services Pager: [phone] Office: [place] 05/23/2021, 11:52 AM

## 2021-05-23 NOTE — Progress Notes (Signed)
NAME:  [name] [name], MRN:  [id number], DOB:  [birth year], LOS: 3 ADMISSION DATE:  05/20/2021, CONSULTATION DATE: May 20, 2021 REFERRING MD: General surgery, CHIEF COMPLAINT: Postop hypertension History of Present Illness:   Patient is 81 year old Caucasian male , had robotic transverse right-sided colectomy 2/24 came out of the OR hypotensive H&H check showed that that he dropped from around 12-7.8 was brought down to the ICU for pressors use we were consulted for persistent hypotension requiring 2 pressors and severe lactic acidosis, AKI  PMH: coronary artery disease  A-fib all anticoagulation antiplatelets have been stopped for few days.   Significant tests/ events reviewed 2/25 transfuse 4 units PRBC , pressors tapered to off CT abdomen/pelvis shows hemoperitoneum Echo showed hyperdynamic LV, HOCM physiology, density along the RV outflow tract, which on review with second cardiologist appears artifactual, does not appear to be embolus or vegetation  2/26 hemoglobin trended down from 9.4-7.0, laparoscopic inspection showed hemoperitoneum with clot around the root of the mesentery and in the pelvis, evacuated, no active bleeding   SUBJ : Remains off pressors. Improving urine output.    Objective   Blood pressure (!) 177/52, pulse 68, temperature 98.4 F (36.9 C), temperature source Oral, resp. rate 15, height 5\' 11"  (1.803 m), weight 109.3 kg, SpO2 94 %.        Intake/Output Summary (Last 24 hours) at 05/23/2021 5643 Last data filed at 05/23/2021 0600 Gross per 24 hour  Intake 1719.5 ml  Output 4003 ml  Net -2283.5 ml    Filed Weights   05/21/21 0600 05/22/21 0500 05/23/21 0417  Weight: 105.3 kg 108.9 kg 109.3 kg    Examination: General: Elderly man, sitting up in bed, no distress Neuro: Grossly nonfocal, alert oriented x3 , hard of hearing  HEENT:  atraumatic , no jaundice , dry mucous membranes  Cardiovascular: S1-S2 regular, ESM 2/6 at base Lungs: No accessory  muscle use, clear lungs Abdomen: Dressing intact, soft, no tenderness, no guarding or rigidity Musculoskeletal:  WNL , normal pulses  Skin: Ecchymosis over right flank  Chest x-ray 2/24 independently reviewed shows new central line in position, no effusions or infiltrates. CT abdomen/pelvis reviewed  Labs show BUN and creatinine decreasing, mild hyponatremia, stable leukocytosis, slight drop in hemoglobin from 8.4-7.7, platelets remain low at 49  Assessment & Plan:    Hemorrhagic shock , resolved -Hemoperitoneum -In hindsight appears to be hemorrhagic shock with HOCM physiology -No active bleeding noted on laparoscopic reinspection, drop in hemoglobin likely related to equilibration -Postop surgical care, starting clears  Thrombocytopenia -dilutional coagulopathy, given 1 unit platelets on 2/26 without significant improvement, will hold off further platelet transfusion unless signs of active bleeding  AKI with metabolic/lactic acidosis --Urine output improving, creatinine is decreased and expect this to normalize   Diabetes type 2 -Hold metformin - SSI  CAD, hypertension Paroxysmal atrial fibrillation with Elevated troponin, flat -EKGs  showed Q waves in inferior leads which are old, no new ST-T wave changes -Holding apixaban, Cardizem, metoprolol, benazepril -Using labetalol until oral meds can be restarted  Starting PT, discontinue Foley and central line but track urine output  Best practice DVT prophylaxis -SCDs, hold apixaban Goals of care: Full CODE STATUS currently, wife updated at bedside  Labs   CBC: Recent Labs  Lab 05/20/21 2009 05/21/21 0417 05/21/21 0418 05/21/21 1158 05/22/21 0343 05/22/21 1950 05/23/21 0408  WBC 12.9*  --  26.4*  --  15.5* 16.7* 15.4*  HGB 9.5*   < > 10.5* 9.4* 7.0*  8.4* 7.7*  HCT 29.1*   < > 32.1* 27.6* 20.2* 24.8* 22.5*  MCV 92.1  --  92.8  --  86.7 89.9 88.2  PLT 128*  --  116*  --  56* 54* 49*   < > = values in this interval  not displayed.     Basic Metabolic Panel: Recent Labs  Lab 05/20/21 2145 05/21/21 0417 05/21/21 0418 05/21/21 1617 05/22/21 0343 05/23/21 0408  NA 134*  --  132* 130* 130* 135  K 3.8  --  4.2 3.9 3.8 3.9  CL 110  --  103 99 96* 102  CO2 14*  --  11* 19* 27 28  GLUCOSE 278*  --  288* 291* 130* 199*  BUN 19  --  21 32* 38* 39*  CREATININE 1.85*  --  2.44* 2.80* 2.95* 2.13*  CALCIUM 8.1*  --  7.9* 7.3* 7.6* 8.0*  MG 1.6*  --   --   --  2.0  --   PHOS  --  9.0*  --   --  5.2*  --     GFR: Estimated Creatinine Clearance: 34.8 mL/min (A) (by C-G formula based on SCr of 2.13 mg/dL (H)). Recent Labs  Lab 05/21/21 0417 05/21/21 0418 05/21/21 0822 05/21/21 1158 05/21/21 1616 05/22/21 0343 05/22/21 1950 05/23/21 0408  WBC  --  26.4*  --   --   --  15.5* 16.7* 15.4*  LATICACIDVEN >9.0*  --  8.8* 7.5* 5.6*  --   --   --      Liver Function Tests: No results for input(s): AST, ALT, ALKPHOS, BILITOT, PROT, ALBUMIN in the last 168 hours. No results for input(s): LIPASE, AMYLASE in the last 168 hours. No results for input(s): AMMONIA in the last 168 hours.  ABG    Component Value Date/Time   PHART 7.44 05/22/2021 0334   PCO2ART 37 05/22/2021 0334   PO2ART 76 (L) 05/22/2021 0334   HCO3 25.2 05/22/2021 0334   TCO2 17 (L) 05/20/2021 1944   ACIDBASEDEF 7.2 (H) 05/21/2021 1532   O2SAT 99.6 05/22/2021 0334      Coagulation Profile: Recent Labs  Lab 05/20/21 1545 05/20/21 2009  INR 1.2 1.4*     Cardiac Enzymes: No results for input(s): CKTOTAL, CKMB, CKMBINDEX, TROPONINI in the last 168 hours.  HbA1C: Hgb A1c MFr Bld  Date/Time Value Ref Range Status  05/05/2021 10:43 AM 6.5 (H) 4.8 - 5.6 % Final    Comment:    (NOTE) Pre diabetes:          5.7%-6.4%  Diabetes:              >6.4%  Glycemic control for   <7.0% adults with diabetes   12/31/2017 11:15 PM 7.5 (H) 4.8 - 5.6 % Final    Comment:    (NOTE) Pre diabetes:          5.7%-6.4% Diabetes:               >6.4% Glycemic control for   <7.0% adults with diabetes     CBG: Recent Labs  Lab 05/22/21 1911 05/22/21 2015 05/22/21 2347 05/23/21 0405 05/23/21 0753  GLUCAP 173* 149* 169* 183* 170*     Updated wife at bedside   Kara Mead MD. [place]. [facility] Pager : [phone]  If no response to pager , please call [phone] until 7 pm After 7:00 pm call Elink  [phone]   05/23/2021

## 2021-05-24 DIAGNOSIS — I129 Hypertensive chronic kidney disease with stage 1 through stage 4 chronic kidney disease, or unspecified chronic kidney disease: Secondary | ICD-10-CM | POA: Diagnosis not present

## 2021-05-24 DIAGNOSIS — E1122 Type 2 diabetes mellitus with diabetic chronic kidney disease: Secondary | ICD-10-CM | POA: Diagnosis not present

## 2021-05-24 DIAGNOSIS — R578 Other shock: Secondary | ICD-10-CM | POA: Diagnosis not present

## 2021-05-24 DIAGNOSIS — N179 Acute kidney failure, unspecified: Secondary | ICD-10-CM | POA: Diagnosis not present

## 2021-05-24 LAB — TYPE AND SCREEN
ABO/RH(D): O POS
Antibody Screen: NEGATIVE
Unit division: 0
Unit division: 0
Unit division: 0
Unit division: 0
Unit division: 0
Unit division: 0
Unit division: 0

## 2021-05-24 LAB — BPAM RBC
Blood Product Expiration Date: 202303262359
Blood Product Expiration Date: 202303262359
Blood Product Expiration Date: 202303262359
Blood Product Expiration Date: 202303262359
Blood Product Expiration Date: 202303262359
Blood Product Expiration Date: 202303272359
Blood Product Expiration Date: 202303272359
ISSUE DATE / TIME: 202302241827
ISSUE DATE / TIME: 202302241859
ISSUE DATE / TIME: 202302242012
ISSUE DATE / TIME: 202302242229
ISSUE DATE / TIME: 202302261005
Unit Type and Rh: 5100
Unit Type and Rh: 5100
Unit Type and Rh: 5100
Unit Type and Rh: 5100
Unit Type and Rh: 5100
Unit Type and Rh: 5100
Unit Type and Rh: 5100

## 2021-05-24 LAB — CBC
HCT: 23.1 % — ABNORMAL LOW (ref 39.0–52.0)
Hemoglobin: 7.7 g/dL — ABNORMAL LOW (ref 13.0–17.0)
MCH: 30.6 pg (ref 26.0–34.0)
MCHC: 33.3 g/dL (ref 30.0–36.0)
MCV: 91.7 fL (ref 80.0–100.0)
Platelets: 58 10*3/uL — ABNORMAL LOW (ref 150–400)
RBC: 2.52 MIL/uL — ABNORMAL LOW (ref 4.22–5.81)
RDW: 15.8 % — ABNORMAL HIGH (ref 11.5–15.5)
WBC: 18.2 10*3/uL — ABNORMAL HIGH (ref 4.0–10.5)
nRBC: 0.9 % — ABNORMAL HIGH (ref 0.0–0.2)

## 2021-05-24 LAB — GLUCOSE, CAPILLARY
Glucose-Capillary: 139 mg/dL — ABNORMAL HIGH (ref 70–99)
Glucose-Capillary: 120 mg/dL — ABNORMAL HIGH (ref 70–99)
Glucose-Capillary: 183 mg/dL — ABNORMAL HIGH (ref 70–99)
Glucose-Capillary: 179 mg/dL — ABNORMAL HIGH (ref 70–99)

## 2021-05-24 LAB — BASIC METABOLIC PANEL
Anion gap: 3 — ABNORMAL LOW (ref 5–15)
BUN: 36 mg/dL — ABNORMAL HIGH (ref 8–23)
CO2: 29 mmol/L (ref 22–32)
Calcium: 8.1 mg/dL — ABNORMAL LOW (ref 8.9–10.3)
Chloride: 106 mmol/L (ref 98–111)
Creatinine, Ser: 1.29 mg/dL — ABNORMAL HIGH (ref 0.61–1.24)
GFR, Estimated: 56 mL/min — ABNORMAL LOW (ref >60)
Glucose, Bld: 140 mg/dL — ABNORMAL HIGH (ref 70–99)
Potassium: 3.8 mmol/L (ref 3.5–5.1)
Sodium: 138 mmol/L (ref 135–145)

## 2021-05-24 MED ORDER — HYDRALAZINE HCL 50 MG PO TABS
50.0000 mg | ORAL_TABLET | Freq: Three times a day (TID) | ORAL | Status: DC
  Administered 2021-05-24 – 2021-05-30 (×19): 50 mg via ORAL
  Filled 2021-05-24 (×19): qty 1

## 2021-05-24 MED ORDER — DILTIAZEM HCL 30 MG PO TABS
60.0000 mg | ORAL_TABLET | Freq: Three times a day (TID) | ORAL | Status: DC
  Administered 2021-05-24 – 2021-05-30 (×18): 60 mg via ORAL
  Filled 2021-05-24: qty 2
  Filled 2021-05-24: qty 1
  Filled 2021-05-24 (×17): qty 2

## 2021-05-24 MED ORDER — HYDRALAZINE HCL 50 MG PO TABS: 100.0000 mg | ORAL_TABLET | Freq: Two times a day (BID) | ORAL | Status: DC

## 2021-05-24 NOTE — Progress Notes (Signed)
2 Days Post-Op lap washout Subjective: Feeling much better, passing flatus, ambulated in hall yesterday  Objective: Vital signs in last 24 hours: Temp:  [97.6 F (36.4 C)-98.3 F (36.8 C)] 97.6 F (36.4 C) (02/28 0400) Pulse Rate:  [50-71] 56 (02/28 0800) Resp:  [8-19] 12 (02/28 0800) BP: (159-215)/(50-165) 171/65 (02/28 0800) SpO2:  [90 %-99 %] 98 % (02/28 0800) Weight:  [110.1 kg] 110.1 kg (02/28 0348)   Intake/Output from previous day: 02/27 0701 - 02/28 0700 In: 474.4 [I.V.:474.4] Out: 1690 [Urine:1690] Intake/Output this shift: No intake/output data recorded.   General appearance: alert and cooperative GI: soft, distended  Incision: no significant drainage  Lab Results:  Recent Labs    05/23/21 1600 05/24/21 0250  WBC 19.0* 18.2*  HGB 8.2* 7.7*  HCT 23.9* 23.1*  PLT 54* 58*    BMET Recent Labs    05/23/21 0408 05/24/21 0250  NA 135 138  K 3.9 3.8  CL 102 106  CO2 28 29  GLUCOSE 199* 140*  BUN 39* 36*  CREATININE 2.13* 1.29*  CALCIUM 8.0* 8.1*    PT/INR Recent Labs    05/23/21 1600  LABPROT 17.1*  INR 1.4*    ABG Recent Labs    05/21/21 1532 05/22/21 0334  PHART 7.34* 7.44  HCO3 17.8* 25.2     MEDS, Scheduled  acetaminophen  1,000 mg Oral Q6H   alvimopan  12 mg Oral BID   Chlorhexidine Gluconate Cloth  6 each Topical Daily   dorzolamide  1 drop Both Eyes BID   insulin aspart  0-15 Units Subcutaneous Q4H   latanoprost  1 drop Both Eyes QHS   mouth rinse  15 mL Mouth Rinse BID   pantoprazole (PROTONIX) IV  40 mg Intravenous Q24H   saccharomyces boulardii  250 mg Oral BID    Studies/Results: No results found.  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   Iron deficiency anemia 09/20/2020   CAD (coronary artery  disease) 07/29/2018   Status post coronary artery stent placement    Hyperlipidemia 01/02/2018   Hx of NSTEMI in 10/209 tx with DES to LCx 12/31/2017   PAF (paroxysmal atrial fibrillation) (HCC)    Type 2 diabetes mellitus with diabetic chronic kidney disease ([place]) 04/12/2016   Type 2 or unspecified type diabetes mellitus, uncontrolled 12/08/2015   Sinus bradycardia 01/14/2014   Pericardial effusion 12/31/2013   Paroxysmal atrial fibrillation (HCC)    Supraventricular tachycardia ([place]) 10/17/2010    Pt with post op bleed, s/p transfusions and washout of hematoma  Plan: Clears today, AAT Out of bed Thrombocytopenia is slightly improved, no signs of active bleeding currently Transfer out of ICU today  LOS: 4 days     .[name] [name], [facility], [place]    05/24/2021 8:23 AM

## 2021-05-24 NOTE — Progress Notes (Addendum)
NAME:  [name] [name], MRN:  [id number], DOB:  1940/12/02, LOS: 4 ADMISSION DATE:  05/20/2021, CONSULTATION DATE: May 20, 2021 REFERRING MD: General surgery, CHIEF COMPLAINT: Postop hypertension History of Present Illness:   Patient is 81 year old Caucasian male , had robotic transverse right-sided colectomy 2/24 came out of the OR hypotensive H&H check showed that that he dropped from around 12-7.8 was brought down to the ICU for pressors use we were consulted for persistent hypotension requiring 2 pressors and severe lactic acidosis, AKI  PMH: coronary artery disease  A-fib   Significant tests/ events reviewed 2/25 transfuse 4 units PRBC , pressors tapered to off CT abdomen/pelvis shows hemoperitoneum Echo showed hyperdynamic LV, HOCM physiology, density along the RV outflow tract, which on review with second cardiologist appears artifactual, does not appear to be embolus or vegetation  2/26 hemoglobin trended down from 9.4-7.0, laparoscopic inspection showed hemoperitoneum with clot around the root of the mesentery and in the pelvis, evacuated, no active bleeding   SUBJ : Good urine output. Complains of scrotal pain. Afebrile    Objective   Blood pressure (!) 171/65, pulse (!) 56, temperature (!) 97.4 F (36.3 C), temperature source Oral, resp. rate 12, height 5\' 11"  (1.803 m), weight 110.1 kg, SpO2 98 %.        Intake/Output Summary (Last 24 hours) at 05/24/2021 0855 Last data filed at 05/24/2021 0500 Gross per 24 hour  Intake 474.44 ml  Output 1690 ml  Net -1215.56 ml    Filed Weights   05/22/21 0500 05/23/21 0417 05/24/21 0348  Weight: 108.9 kg 109.3 kg 110.1 kg    Examination: General: Elderly man, lying in bed, no distress Neuro: Grossly nonfocal, alert oriented x3 , hard of hearing  HEENT:  atraumatic , no jaundice , dry mucous membranes  Cardiovascular: S1-S2 regular, ESM 2/6 at base Lungs: No accessory muscle use, clear lungs Abdomen: Dressing intact,  soft, no tenderness, no guarding or rigidity,  Musculoskeletal:  WNL , normal pulses  Skin: extensive ecchymosis across both flanks and abdomen GU: Scrotal swelling and ecchymosis  Chest x-ray 2/24 independently reviewed shows new central line in position, no effusions or infiltrates. CT abdomen/pelvis 2/25 reviewed  Labs show improved sodium, persistent leukocytosis, stable anemia and thrombocytopenia, BUN and creatinine continue to decrease  Assessment & Plan:    Hemorrhagic shock , resolved -Hemoperitoneum -In hindsight appears to be hemorrhagic shock with HOCM physiology -No active bleeding noted on laparoscopic reinspection, drop in hemoglobin likely related to equilibration -Postop surgical care, tolerating clears, advance per surgery  Thrombocytopenia -dilutional coagulopathy, s/p 1 unit platelets on 2/26 without significant improvement, hold platelet transfusion unless signs of active bleeding  AKI with metabolic/lactic acidosis , resolving --expect this to normalize   Diabetes type 2 -Hold metformin - SSI  CAD, hypertension Paroxysmal atrial fibrillation with Elevated troponin, flat -EKGs  showed Q waves in inferior leads which are old, no new ST-T wave changes -Holding apixaban, metoprolol, benazepril -Resume hydralazine and Cardizem -Using labetalol for breakthrough, resume metoprolol once bradycardia improves -Defer to surgery when apixaban can be restarted  Scrotal hematoma -elevation, defer to surgery  Best practice DVT prophylaxis -SCDs, hold apixaban Goals of care: Full CODE STATUS currently, wife updated at bedside Plan is to transfer to telemetry, PCCM available as needed  Labs   CBC: Recent Labs  Lab 05/22/21 0343 05/22/21 1950 05/23/21 0408 05/23/21 1600 05/24/21 0250  WBC 15.5* 16.7* 15.4* 19.0* 18.2*  HGB 7.0* 8.4* 7.7* 8.2* 7.7*  HCT  20.2* 24.8* 22.5* 23.9* 23.1*  MCV 86.7 89.9 88.2 89.5 91.7  PLT 56* 54* 49* 54* 58*     Basic  Metabolic Panel: Recent Labs  Lab 05/20/21 2145 05/21/21 0417 05/21/21 0418 05/21/21 1617 05/22/21 0343 05/23/21 0408 05/24/21 0250  NA 134*  --  132* 130* 130* 135 138  K 3.8  --  4.2 3.9 3.8 3.9 3.8  CL 110  --  103 99 96* 102 106  CO2 14*  --  11* 19* 27 28 29   GLUCOSE 278*  --  288* 291* 130* 199* 140*  BUN 19  --  21 32* 38* 39* 36*  CREATININE 1.85*  --  2.44* 2.80* 2.95* 2.13* 1.29*  CALCIUM 8.1*  --  7.9* 7.3* 7.6* 8.0* 8.1*  MG 1.6*  --   --   --  2.0  --   --   PHOS  --  9.0*  --   --  5.2*  --   --     GFR: Estimated Creatinine Clearance: 57.6 mL/min (A) (by C-G formula based on SCr of 1.29 mg/dL (H)). Recent Labs  Lab 05/21/21 0417 05/21/21 0418 05/21/21 0822 05/21/21 1158 05/21/21 1616 05/22/21 0343 05/22/21 1950 05/23/21 0408 05/23/21 1600 05/24/21 0250  WBC  --    < >  --   --   --    < > 16.7* 15.4* 19.0* 18.2*  LATICACIDVEN >9.0*  --  8.8* 7.5* 5.6*  --   --   --   --   --    < > = values in this interval not displayed.     Liver Function Tests: No results for input(s): AST, ALT, ALKPHOS, BILITOT, PROT, ALBUMIN in the last 168 hours. No results for input(s): LIPASE, AMYLASE in the last 168 hours. No results for input(s): AMMONIA in the last 168 hours.  ABG    Component Value Date/Time   PHART 7.44 05/22/2021 0334   PCO2ART 37 05/22/2021 0334   PO2ART 76 (L) 05/22/2021 0334   HCO3 25.2 05/22/2021 0334   TCO2 17 (L) 05/20/2021 1944   ACIDBASEDEF 7.2 (H) 05/21/2021 1532   O2SAT 99.6 05/22/2021 0334      Coagulation Profile: Recent Labs  Lab 05/20/21 1545 05/20/21 2009 05/23/21 1600  INR 1.2 1.4* 1.4*     Cardiac Enzymes: No results for input(s): CKTOTAL, CKMB, CKMBINDEX, TROPONINI in the last 168 hours.  HbA1C: Hgb A1c MFr Bld  Date/Time Value Ref Range Status  05/05/2021 10:43 AM 6.5 (H) 4.8 - 5.6 % Final    Comment:    (NOTE) Pre diabetes:          5.7%-6.4%  Diabetes:              >6.4%  Glycemic control for    <7.0% adults with diabetes   12/31/2017 11:15 PM 7.5 (H) 4.8 - 5.6 % Final    Comment:    (NOTE) Pre diabetes:          5.7%-6.4% Diabetes:              >6.4% Glycemic control for   <7.0% adults with diabetes     CBG: Recent Labs  Lab 05/23/21 1607 05/23/21 1956 05/23/21 2323 05/24/21 0344 05/24/21 0759  GLUCAP 170* 150* 138* 139* 120*        Kara Mead MD. FCCP. [facility] Pager : [phone]  If no response to pager , please call [phone] until 7 pm After  7:00 pm call Elink  [phone]   05/24/2021

## 2021-05-25 LAB — PREPARE FRESH FROZEN PLASMA: Unit division: 0

## 2021-05-25 LAB — CBC
HCT: 25.1 % — ABNORMAL LOW (ref 39.0–52.0)
Hemoglobin: 8 g/dL — ABNORMAL LOW (ref 13.0–17.0)
MCH: 30.1 pg (ref 26.0–34.0)
MCHC: 31.9 g/dL (ref 30.0–36.0)
MCV: 94.4 fL (ref 80.0–100.0)
Platelets: 94 10*3/uL — ABNORMAL LOW (ref 150–400)
RBC: 2.66 MIL/uL — ABNORMAL LOW (ref 4.22–5.81)
RDW: 15.9 % — ABNORMAL HIGH (ref 11.5–15.5)
WBC: 13.5 10*3/uL — ABNORMAL HIGH (ref 4.0–10.5)
nRBC: 4.2 % — ABNORMAL HIGH (ref 0.0–0.2)

## 2021-05-25 LAB — BPAM FFP
Blood Product Expiration Date: 202303031138
Blood Product Expiration Date: 202303031353
Blood Product Expiration Date: 202303031353
ISSUE DATE / TIME: 202302261255
ISSUE DATE / TIME: 202302261541
Unit Type and Rh: 5100
Unit Type and Rh: 5100
Unit Type and Rh: 5100

## 2021-05-25 LAB — GLUCOSE, CAPILLARY
Glucose-Capillary: 119 mg/dL — ABNORMAL HIGH (ref 70–99)
Glucose-Capillary: 123 mg/dL — ABNORMAL HIGH (ref 70–99)
Glucose-Capillary: 124 mg/dL — ABNORMAL HIGH (ref 70–99)
Glucose-Capillary: 165 mg/dL — ABNORMAL HIGH (ref 70–99)
Glucose-Capillary: 195 mg/dL — ABNORMAL HIGH (ref 70–99)
Glucose-Capillary: 214 mg/dL — ABNORMAL HIGH (ref 70–99)

## 2021-05-25 LAB — BASIC METABOLIC PANEL
Anion gap: 5 (ref 5–15)
BUN: 24 mg/dL — ABNORMAL HIGH (ref 8–23)
CO2: 25 mmol/L (ref 22–32)
Calcium: 8.3 mg/dL — ABNORMAL LOW (ref 8.9–10.3)
Chloride: 107 mmol/L (ref 98–111)
Creatinine, Ser: 0.93 mg/dL (ref 0.61–1.24)
GFR, Estimated: >60 mL/min (ref >60)
Glucose, Bld: 127 mg/dL — ABNORMAL HIGH (ref 70–99)
Potassium: 3.4 mmol/L — ABNORMAL LOW (ref 3.5–5.1)
Sodium: 137 mmol/L (ref 135–145)

## 2021-05-25 MED ORDER — ENOXAPARIN SODIUM 100 MG/ML IJ SOSY
100.0000 mg | PREFILLED_SYRINGE | Freq: Two times a day (BID) | INTRAMUSCULAR | Status: DC
  Administered 2021-05-25 – 2021-05-29 (×10): 100 mg via SUBCUTANEOUS
  Filled 2021-05-25 (×11): qty 1

## 2021-05-25 MED ORDER — FUROSEMIDE 10 MG/ML IJ SOLN
20.0000 mg | Freq: Once | INTRAMUSCULAR | Status: AC
  Administered 2021-05-25: 20 mg via INTRAVENOUS
  Filled 2021-05-25: qty 2

## 2021-05-25 NOTE — Progress Notes (Signed)
3 Days Post-Op lap washout ?Subjective: ?Feeling ok, having some bloating but passing flatus, transferred to floor yesterday ? ?Objective: ?Vital signs in last 24 hours: ?Temp:  [97.7 ?F (36.5 ?C)-99 ?F (37.2 ?C)] 99 ?F (37.2 ?C) (03/01 0414) ?Pulse Rate:  [55-67] 67 (03/01 0414) ?Resp:  [14-18] 14 (03/01 0414) ?BP: (140-202)/(46-114) 150/61 (03/01 0414) ?SpO2:  [90 %-95 %] 94 % (03/01 0414) ?Weight:  [107.2 kg] 107.2 kg (03/01 0414) ? ? ?Intake/Output from previous day: ?02/28 0701 - 03/01 0700 ?In: 1100 [P.O.:1100] ?Out: 2200 [Urine:2200] ?Intake/Output this shift: ?Total I/O ?In: -  ?Out: 150 [Urine:150] ? ? ?General appearance: alert and cooperative ?GI: soft, distended ? ?Incision: no significant drainage ? ?Lab Results:  ?Recent Labs  ?  05/24/21 ?0250 05/25/21 ?2956  ?WBC 18.2* 13.5*  ?HGB 7.7* 8.0*  ?HCT 23.1* 25.1*  ?PLT 58* 94*  ? ? ?BMET ?Recent Labs  ?  05/24/21 ?0250 05/25/21 ?2130  ?NA 138 137  ?K 3.8 3.4*  ?CL 106 107  ?CO2 29 25  ?GLUCOSE 140* 127*  ?BUN 36* 24*  ?CREATININE 1.29* 0.93  ?CALCIUM 8.1* 8.3*  ? ? ?PT/INR ?Recent Labs  ?  05/23/21 ?1600  ?LABPROT 17.1*  ?INR 1.4*  ? ? ?ABG ?No results for input(s): PHART, HCO3 in the last 72 hours. ? ?Invalid input(s): PCO2, PO2 ? ? ?MEDS, Scheduled ? acetaminophen  1,000 mg Oral Q6H  ? alvimopan  12 mg Oral BID  ? diltiazem  60 mg Oral TID  ? dorzolamide  1 drop Both Eyes BID  ? hydrALAZINE  50 mg Oral TID  ? latanoprost  1 drop Both Eyes QHS  ? mouth rinse  15 mL Mouth Rinse BID  ? pantoprazole (PROTONIX) IV  40 mg Intravenous Q24H  ? saccharomyces boulardii  250 mg Oral BID  ? ? ?Studies/Results: ?No results found. ? ?Assessment: ?s/p Procedure(s): ?LAPAROSCOPY DIAGNOSTIC WITH WASHOUT OF ABDOMEN ?Patient Active Problem List  ? Diagnosis Date Noted  ? HTN (hypertension) 04/19/2021  ? Pre-op testing 04/19/2021  ? Colon cancer ([place]) 01/31/2021  ? 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 Iron deficiency anemia 09/20/2020  ? CAD (coronary artery disease) 07/29/2018   ? Status post coronary artery stent placement   ? Hyperlipidemia 01/02/2018  ? Hx of NSTEMI in 10/209 tx with DES to LCx 12/31/2017  ? PAF (paroxysmal atrial fibrillation) ([place])   ? Type 2 diabetes mellitus with diabetic chronic kidney disease ([place]) 04/12/2016  ? Type 2 or unspecified type diabetes mellitus, uncontrolled 12/08/2015  ? Sinus bradycardia 01/14/2014  ? Pericardial effusion 12/31/2013  ? Paroxysmal atrial fibrillation (HCC)   ? Supraventricular tachycardia ([place]) 10/17/2010  ? ? ?Pt with post op bleed, s/p transfusions and washout of hematoma.  No active bleeding noted on lap evaluation ? ?Plan: ?Limited fulls today, await return of bowel function ?Out of bed, ambulate in hall ?Thrombocytopenia is improved, no signs of active bleeding currently- will transfer to therapeutic lovenox today and recheck cbc in AM.  Plan to switch to elliquis if stable on lovenox. ? ? LOS: 5 days  ? ? ? ?Marland KitchenRosario Adie, MD ?[facility], [place] ? ? ? ?05/25/2021 ?8:51 AM ? ? ? ?  ?

## 2021-05-25 NOTE — Care Management Important Message (Signed)
Important Message ? ?Patient Details IM Letter placed in Patients room. ?Name: [name] [name] ?MRN: [id number] ?Date of Birth: Dec 01, 1940 ? ? ?Medicare Important Message Given:  Yes ? ? ? ? ?Kerin Salen ?05/25/2021, 12:29 PM ?

## 2021-05-25 NOTE — Progress Notes (Signed)
ANTICOAGULATION CONSULT NOTE - Initial Consult ? ?Pharmacy Consult for LMWH ?Indication:  ? ?Allergies  ?Allergen Reactions  ? Amiodarone Rash  ? ? ?Patient Measurements: ?Height: 5\' 11"  (180.3 cm) ?Weight: 107.2 kg (236 lb 5.3 oz) ?IBW/kg (Calculated) : 75.3 ?Heparin Dosing Weight:  ? ?Vital Signs: ?Temp: 99 ?F (37.2 ?C) (03/01 0414) ?Temp Source: Oral (03/01 0414) ?BP: 150/61 (03/01 0414) ?Pulse Rate: 67 (03/01 0414) ? ?Labs: ?Recent Labs  ?  05/23/21 ?0408 05/23/21 ?1600 05/24/21 ?0250 05/25/21 ?2951  ?HGB 7.7* 8.2* 7.7* 8.0*  ?HCT 22.5* 23.9* 23.1* 25.1*  ?PLT 49* 54* 58* 94*  ?LABPROT  --  17.1*  --   --   ?INR  --  1.4*  --   --   ?CREATININE 2.13*  --  1.29* 0.93  ? ? ?Estimated Creatinine Clearance: 78.9 mL/min (by C-G formula based on SCr of 0.93 mg/dL). ? ? ?Medical History: ?Past Medical History:  ?Diagnosis Date  ? Anemia   ? Arthritis   ? CAD (coronary artery disease)   ? DES to circumflex 12/2017  ? Diabetes mellitus without complication ([place])   ? GERD (gastroesophageal reflux disease)   ? Glaucoma   ? Gout   ? Headache   ? History of kidney stones   ? Hypertension   ? NSTEMI (non-ST elevated myocardial infarction) ([place]) 12/31/2017  ? Paroxysmal atrial fibrillation (HCC)   ? Pericardial effusion   ? Idiopathic, recurrent pericardial effusion s/p pericardial window.  ? Prostate cancer [facility]) 2007  ? S/P Seed implants   ? Supraventricular tachycardia ([place])   ? Temporal arteritis ([place])   ? ? ?Medications:  ?Medications Prior to Admission  ?Medication Sig Dispense Refill Last Dose  ? allopurinol (ZYLOPRIM) 300 MG tablet Take 450 mg by mouth daily.    05/20/2021 at 0930  ? apixaban (ELIQUIS) 5 MG TABS tablet Take 1 tablet (5 mg total) by mouth 2 (two) times daily. 60 tablet 6 05/16/2021  ? atorvastatin (LIPITOR) 80 MG tablet TAKE 1 TABLET BY MOUTH ONCE DAILY AT  6  PM 90 tablet 0 05/19/2021  ? benazepril (LOTENSIN) 40 MG tablet Take 1 tablet by mouth once daily 90 tablet 0 05/19/2021  ? diltiazem (CARDIZEM CD)  180 MG 24 hr capsule Take 1 capsule (180 mg total) by mouth daily. 30 capsule 1 05/20/2021 at 0930  ? dorzolamide (TRUSOPT) 2 % ophthalmic solution Place 1 drop into both eyes 2 (two) times daily.   05/20/2021 at 0930  ? FERREX 150 150 MG capsule Take 150 mg by mouth 2 (two) times daily.   05/19/2021  ? hydrALAZINE (APRESOLINE) 100 MG tablet Take 100 mg by mouth 2 (two) times daily.   05/20/2021 at 0930  ? hydrochlorothiazide (HYDRODIURIL) 12.5 MG tablet Take 12.5 mg by mouth daily.   05/19/2021  ? latanoprost (XALATAN) 0.005 % ophthalmic solution Place 1 drop into both eyes at bedtime.   05/19/2021  ? metFORMIN (GLUCOPHAGE-XR) 500 MG 24 hr tablet Take 500 mg by mouth 2 (two) times daily.   05/19/2021  ? metoprolol succinate (TOPROL-XL) 50 MG 24 hr tablet Take 50 mg by mouth daily. Take with or immediately following a meal.   05/20/2021 at 0930  ? Multiple Vitamin (MULTIVITAMIN) tablet Take 1 tablet by mouth in the morning.   05/19/2021  ? pantoprazole (PROTONIX) 40 MG tablet Take 1 tablet (40 mg total) by mouth 2 (two) times daily before a meal. 90 tablet 3 05/20/2021 at 930  ? vitamin C (ASCORBIC ACID)  500 MG tablet Take 500 mg by mouth 2 (two) times daily.    05/19/2021  ? ? ?Assessment: ?81 yo M on apixaban 5 mg po bid PTA for Afib. ?Pharmacy consulted to dose LMWH for Afib.  ?robotic transverse right-sided colectomy 2/24  ?Acute post-op hemoperitoneum causing hemorrhagic shock/anemia.  S/p washout of hematoma & transfusions 4 U PRBC ?Per [facility] note "Thrombocytopenia is improved, no signs of active bleeding currently- will transfer to therapeutic lovenox today and recheck cbc in AM.  Plan to switch to elliquis if stable on lovenox". ? Hg 8, PLT 94, SCr 0.93 ?  ?Plan:  ?Lovenox 100 mg sq q12h ?F/u CBC, SCr ?F/u ability to resume PTA apixaban 5 mg po bid ? ?Eudelia Bunch, Pharm.D ?05/25/2021 9:25 AM ? ?

## 2021-05-25 NOTE — Progress Notes (Signed)
Physical Therapy Treatment ?Patient Details ?Name: [name] [name] ?MRN: [id number] ?DOB: 04-23-1940 ?Today's Date: 05/25/2021 ? ? ?History of Present Illness Pt is an 81 year old Caucasian male who had robotic transverse right-sided colectomy 2/24 came out of the OR hypotensive, H&H check showed that that he dropped from around 12-7.8 was brought down to the ICU for pressors.  Pt had post op bleed, s/p transfusions and washout of hematoma.  PMHx: afib, NSTEMI, HTN, SVT, DM, anemia, glaucoma ? ?  ?PT Comments  ? ? Pt making gradual progress.  He had most difficulty with sit to stand - limited by scrotal edema.  Used sheet to make scrotal sling while ambulating.  Continue to progress as able.  ?   ?Recommendations for follow up therapy are one component of a multi-disciplinary discharge planning process, led by the attending physician.  Recommendations may be updated based on patient status, additional functional criteria and insurance authorization. ? ?Follow Up Recommendations ? Home health PT ?  ?  ?Assistance Recommended at Discharge Set up Supervision/Assistance  ?Patient can return home with the following A little help with walking and/or transfers;A little help with bathing/dressing/bathroom;Help with stairs or ramp for entrance;Assistance with cooking/housework ?  ?Equipment Recommendations ? None recommended by PT  ?  ?Recommendations for Other Services   ? ? ?  ?Precautions / Restrictions Precautions ?Precautions: Fall ?Restrictions ?Weight Bearing Restrictions: No  ?  ? ?Mobility ? Bed Mobility ?  ?  ?  ?  ?  ?  ?  ?General bed mobility comments: pt in recliner ?  ? ?Transfers ?Overall transfer level: Needs assistance ?Equipment used: Rolling walker (2 wheels) ?Transfers: Sit to/from Stand ?Sit to Stand: Min assist ?  ?  ?  ?  ?  ?General transfer comment: Cues to lean forward as able (limited due to scrotal edema) then use of mometum and heavy use of UE ?  ? ?Ambulation/Gait ?Ambulation/Gait assistance: Min  guard ?Gait Distance (Feet): 250 Feet ?Assistive device: Rolling walker (2 wheels) ?Gait Pattern/deviations: Decreased stride length, Trunk flexed ?Gait velocity: decreased ?  ?  ?General Gait Details: Cues for RW proximity ? ? ?Stairs ?  ?  ?  ?  ?  ? ? ?Wheelchair Mobility ?  ? ?Modified Rankin (Stroke Patients Only) ?  ? ? ?  ?Balance Overall balance assessment: Needs assistance ?Sitting-balance support: No upper extremity supported ?Sitting balance-Leahy Scale: Good ?  ?  ?Standing balance support: Bilateral upper extremity supported, No upper extremity supported ?Standing balance-Leahy Scale: Fair ?Standing balance comment: RW to ambulate but could static standwithout AD ?  ?  ?  ?  ?  ?  ?  ?  ?  ?  ?  ?  ? ?  ?Cognition Arousal/Alertness: Awake/alert ?Behavior During Therapy: [facility] for tasks assessed/performed ?Overall Cognitive Status: Within Functional Limits for tasks assessed ?  ?  ?  ?  ?  ?  ?  ?  ?  ?  ?  ?  ?  ?  ?  ?  ?  ?  ?  ? ?  ?Exercises General Exercises - Lower Extremity ?Ankle Circles/Pumps: AROM, 10 reps, Both, Supine ?Long Arc Quad: AROM, Both, 10 reps, Supine ?Hip Flexion/Marching:  (unable to tolerate) ? ?  ?General Comments General comments (skin integrity, edema, etc.): Pt with c/o scrotal edema - made sling with sheet to assist with ambulation ?  ?  ? ?Pertinent Vitals/Pain Pain Assessment ?Pain Assessment: Faces ?Faces Pain Scale: Hurts little more ?Pain  Location: Scrotum ?Pain Descriptors / Indicators: Discomfort ?Pain Intervention(s): Limited activity within patient's tolerance, Monitored during session  ? ? ?Home Living   ?  ?  ?  ?  ?  ?  ?  ?  ?  ?   ?  ?Prior Function    ?  ?  ?   ? ?PT Goals (current goals can now be found in the care plan section) Progress towards PT goals: Progressing toward goals ? ?  ?Frequency ? ? ? Min 3X/week ? ? ? ?  ?PT Plan Current plan remains appropriate  ? ? ?Co-evaluation   ?  ?  ?  ?  ? ?  ?AM-PAC PT "6 Clicks" Mobility   ?Outcome Measure ? Help  needed turning from your back to your side while in a flat bed without using bedrails?: A Little ?Help needed moving from lying on your back to sitting on the side of a flat bed without using bedrails?: A Little ?Help needed moving to and from a bed to a chair (including a wheelchair)?: A Little ?Help needed standing up from a chair using your arms (e.g., wheelchair or bedside chair)?: A Little ?Help needed to walk in hospital room?: A Little ?Help needed climbing 3-5 steps with a railing? : A Little ?6 Click Score: 18 ? ?  ?End of Session Equipment Utilized During Treatment: Gait belt ?Activity Tolerance: Patient tolerated treatment well ?Patient left: in chair;with call bell/phone within reach;with family/visitor present ?Nurse Communication: Mobility status ?PT Visit Diagnosis: Difficulty in walking, not elsewhere classified (R26.2) ?  ? ? ?Time: 9390-3009 ?PT Time Calculation (min) (ACUTE ONLY): 24 min ? ?Charges:  $Gait Training: 8-22 mins ?$Therapeutic Activity: 8-22 mins          ?          ? ?Abran Richard, PT ?Acute Rehab Services ?Pager [phone] ?[facility] [phone] ? ? ? ?Mikael Spray Islay Polanco ?05/25/2021, 11:41 AM ? ?

## 2021-05-26 LAB — CBC
HCT: 26.1 % — ABNORMAL LOW (ref 39.0–52.0)
Hemoglobin: 8.3 g/dL — ABNORMAL LOW (ref 13.0–17.0)
MCH: 29.9 pg (ref 26.0–34.0)
MCHC: 31.8 g/dL (ref 30.0–36.0)
MCV: 93.9 fL (ref 80.0–100.0)
Platelets: 117 10*3/uL — ABNORMAL LOW (ref 150–400)
RBC: 2.78 MIL/uL — ABNORMAL LOW (ref 4.22–5.81)
RDW: 15.8 % — ABNORMAL HIGH (ref 11.5–15.5)
WBC: 11.6 10*3/uL — ABNORMAL HIGH (ref 4.0–10.5)
nRBC: 6.2 % — ABNORMAL HIGH (ref 0.0–0.2)

## 2021-05-26 MED ORDER — FUROSEMIDE 10 MG/ML IJ SOLN
20.0000 mg | Freq: Once | INTRAMUSCULAR | Status: AC
  Administered 2021-05-26: 20 mg via INTRAVENOUS
  Filled 2021-05-26: qty 2

## 2021-05-26 MED ORDER — ENSURE ENLIVE PO LIQD
237.0000 mL | Freq: Two times a day (BID) | ORAL | Status: DC
  Administered 2021-05-26 – 2021-05-29 (×7): 237 mL via ORAL

## 2021-05-26 MED ORDER — TRAMADOL HCL 50 MG PO TABS
50.0000 mg | ORAL_TABLET | Freq: Four times a day (QID) | ORAL | Status: DC | PRN
  Administered 2021-05-26 (×2): 50 mg via ORAL
  Administered 2021-05-27 – 2021-05-29 (×3): 100 mg via ORAL
  Filled 2021-05-26: qty 2
  Filled 2021-05-26: qty 1
  Filled 2021-05-26: qty 2
  Filled 2021-05-26: qty 1
  Filled 2021-05-26: qty 2

## 2021-05-26 NOTE — Progress Notes (Signed)
4 Days Post-Op lap washout ?Subjective: ?Feeling ok, having some bloating but passing flatus, ambulated in hall yesterday, up to a chair this am ?Objective: ?Vital signs in last 24 hours: ?Temp:  [97.8 ?F (36.6 ?C)-98.9 ?F (37.2 ?C)] 98.2 ?F (36.8 ?C) (03/02 0527) ?Pulse Rate:  [67-73] 69 (03/02 0527) ?Resp:  [14-17] 14 (03/02 0527) ?BP: (148-172)/(57-97) 151/97 (03/02 0527) ?SpO2:  [93 %-96 %] 93 % (03/02 0527) ? ? ?Intake/Output from previous day: ?03/01 0701 - 03/02 0700 ?In: 2276.7 [P.O.:360; I.V.:1916.7] ?Out: 2900 [Urine:2900] ?Intake/Output this shift: ?No intake/output data recorded. ? ? ?General appearance: alert and cooperative ?GI: soft, distended ? ?Incision: no significant drainage ? ?Lab Results:  ?Recent Labs  ?  05/25/21 ?6073 05/26/21 ?7106  ?WBC 13.5* 11.6*  ?HGB 8.0* 8.3*  ?HCT 25.1* 26.1*  ?PLT 94* 117*  ? ? ?BMET ?Recent Labs  ?  05/24/21 ?0250 05/25/21 ?2694  ?NA 138 137  ?K 3.8 3.4*  ?CL 106 107  ?CO2 29 25  ?GLUCOSE 140* 127*  ?BUN 36* 24*  ?CREATININE 1.29* 0.93  ?CALCIUM 8.1* 8.3*  ? ? ?PT/INR ?Recent Labs  ?  05/23/21 ?1600  ?LABPROT 17.1*  ?INR 1.4*  ? ? ?ABG ?No results for input(s): PHART, HCO3 in the last 72 hours. ? ?Invalid input(s): PCO2, PO2 ? ? ?MEDS, Scheduled ? acetaminophen  1,000 mg Oral Q6H  ? alvimopan  12 mg Oral BID  ? diltiazem  60 mg Oral TID  ? dorzolamide  1 drop Both Eyes BID  ? enoxaparin (LOVENOX) injection  100 mg Subcutaneous Q12H  ? furosemide  20 mg Intravenous Once  ? hydrALAZINE  50 mg Oral TID  ? latanoprost  1 drop Both Eyes QHS  ? mouth rinse  15 mL Mouth Rinse BID  ? pantoprazole (PROTONIX) IV  40 mg Intravenous Q24H  ? saccharomyces boulardii  250 mg Oral BID  ? ? ?Studies/Results: ?No results found. ? ?Assessment: ?s/p Procedure(s): ?LAPAROSCOPY DIAGNOSTIC WITH WASHOUT OF ABDOMEN ?Patient Active Problem List  ? Diagnosis Date Noted  ? HTN (hypertension) 04/19/2021  ? Pre-op testing 04/19/2021  ? Colon cancer ([place]) 01/31/2021  ? Duodenal adenoma  01/31/2021  ? Iron deficiency anemia 09/20/2020  ? CAD (coronary artery disease) 07/29/2018  ? Status post coronary artery stent placement   ? Hyperlipidemia 01/02/2018  ? Hx of NSTEMI in 10/209 tx with DES to LCx 12/31/2017  ? PAF (paroxysmal atrial fibrillation) ([place])   ? Type 2 diabetes mellitus with diabetic chronic kidney disease ([place]) 04/12/2016  ? Type 2 or unspecified type diabetes mellitus, uncontrolled 12/08/2015  ? Sinus bradycardia 01/14/2014  ? Pericardial effusion 12/31/2013  ? Paroxysmal atrial fibrillation (HCC)   ? Supraventricular tachycardia ([place]) 10/17/2010  ? ? ?Pt with post op bleed, s/p transfusions and washout of hematoma.  No active bleeding noted on f/u lap evaluation ? ?Plan: ?fulls today, await return of bowel function, ensure supplements for nutrition ?Out of bed, ambulate in hall, appreciate PT help ?Thrombocytopenia is improved, no signs of active bleeding currently-  therapeutic lovenox until pt tolerating a reg diet.  Then will switch to home anticoagulation   ? ? LOS: 6 days  ? ? ? ?Marland KitchenRosario Adie, MD ?[facility], [place] ? ? ? ?05/26/2021 ?8:38 AM ? ? ? ?  ?

## 2021-05-26 NOTE — Progress Notes (Signed)
?   05/26/21 1229  ?Mobility  ?Activity Ambulated with assistance in hallway  ?Level of Assistance Standby assist, set-up cues, supervision of patient - no hands on  ?Assistive Device Front wheel walker  ?Distance Ambulated (ft) 250 ft  ?Activity Response Tolerated well  ?$Mobility charge 1 Mobility  ? ?Pt agreeable to mobilize this morning. Ambulated about 249ft in hall with RW, tolerated well. Noted 5/20 scrotal pain, otherwise no complaints. Left pt in bed, call bell at side. RN/NT notified of session. ? ?Rudell Cobb. ?Mobility Specialist ?Acute Rehab Services ?Office: [phone] ?  ? ?

## 2021-05-27 MED ORDER — PROPOFOL 10 MG/ML IV BOLUS
INTRAVENOUS | Status: AC
  Filled 2021-05-27: qty 20

## 2021-05-27 MED ORDER — BISACODYL 10 MG RE SUPP
10.0000 mg | Freq: Once | RECTAL | Status: AC
  Administered 2021-05-27: 10 mg via RECTAL
  Filled 2021-05-27: qty 1

## 2021-05-27 MED ORDER — PHENYLEPHRINE HCL-NACL 20-0.9 MG/250ML-% IV SOLN
INTRAVENOUS | Status: AC
  Filled 2021-05-27: qty 250

## 2021-05-27 NOTE — TOC Progression Note (Signed)
Transition of Care (TOC) - Progression Note  ? ? ?Patient Details  ?Name: [name] [name] ?MRN: [id number] ?Date of Birth: May 05, 1940 ? ?Transition of Care (TOC) CM/SW Contact  ?Tawanna Cooler, RN ?Phone Number: ?05/27/2021, 1:08 PM ? ?Clinical Narrative:    ? ?Previous PT note recommends HH PT.  Since then, patient walking 250 feet with standby assist.  No longer qualifies for home health services.   ?TOC will continue to follow.  ? ?  ?Barriers to Discharge: Continued Medical Work up ? ?Expected Discharge Plan and Services ?  ?  ?  ?  ?  ?                ?  ?  ?  ?  ?  ?  ?  ?  ?  ?  ? ? ?Social Determinants of Health (SDOH) Interventions ?  ? ?Readmission Risk Interventions ?No flowsheet data found. ? ?

## 2021-05-27 NOTE — Progress Notes (Signed)
5 Days Post-Op lap washout ?Subjective: ?Feeling ok, having some bloating after drinking liquids but passing flatus, ambulated in hall yesterday, up to a chair this am ?Objective: ?Vital signs in last 24 hours: ?Temp:  [98.3 ?F (36.8 ?C)-98.8 ?F (37.1 ?C)] 98.8 ?F (37.1 ?C) (03/03 0427) ?Pulse Rate:  [69-77] 69 (03/03 0427) ?Resp:  [16-19] 17 (03/03 0427) ?BP: (111-171)/(61-98) 156/71 (03/03 0427) ?SpO2:  [95 %-97 %] 95 % (03/03 0427) ? ? ?Intake/Output from previous day: ?03/02 0701 - 03/03 0700 ?In: 360 [P.O.:360] ?Out: 1750 [UMPNT:6144] ?Intake/Output this shift: ?No intake/output data recorded. ? ? ?General appearance: alert and cooperative ?GI: soft, distended ?RLQ port site with some mild erythema ?Incision: no significant drainage ? ?Lab Results:  ?Recent Labs  ?  05/25/21 ?3154 05/26/21 ?0086  ?WBC 13.5* 11.6*  ?HGB 8.0* 8.3*  ?HCT 25.1* 26.1*  ?PLT 94* 117*  ? ? ?BMET ?Recent Labs  ?  05/25/21 ?7619  ?NA 137  ?K 3.4*  ?CL 107  ?CO2 25  ?GLUCOSE 127*  ?BUN 24*  ?CREATININE 0.93  ?CALCIUM 8.3*  ? ? ?PT/INR ?No results for input(s): LABPROT, INR in the last 72 hours. ? ?ABG ?No results for input(s): PHART, HCO3 in the last 72 hours. ? ?Invalid input(s): PCO2, PO2 ? ? ?MEDS, Scheduled ? acetaminophen  1,000 mg Oral Q6H  ? alvimopan  12 mg Oral BID  ? diltiazem  60 mg Oral TID  ? dorzolamide  1 drop Both Eyes BID  ? enoxaparin (LOVENOX) injection  100 mg Subcutaneous Q12H  ? feeding supplement  237 mL Oral BID BM  ? hydrALAZINE  50 mg Oral TID  ? latanoprost  1 drop Both Eyes QHS  ? pantoprazole (PROTONIX) IV  40 mg Intravenous Q24H  ? saccharomyces boulardii  250 mg Oral BID  ? ? ?Studies/Results: ?No results found. ? ?Assessment: ?s/p Procedure(s): ?LAPAROSCOPY DIAGNOSTIC WITH WASHOUT OF ABDOMEN ?Patient Active Problem List  ? Diagnosis Date Noted  ? HTN (hypertension) 04/19/2021  ? Pre-op testing 04/19/2021  ? Colon cancer ([place]) 01/31/2021  ? 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 Iron deficiency anemia 09/20/2020  ?  CAD (coronary artery disease) 07/29/2018  ? Status post coronary artery stent placement   ? Hyperlipidemia 01/02/2018  ? Hx of NSTEMI in 10/209 tx with DES to LCx 12/31/2017  ? PAF (paroxysmal atrial fibrillation) ([place])   ? Type 2 diabetes mellitus with diabetic chronic kidney disease ([place]) 04/12/2016  ? Type 2 or unspecified type diabetes mellitus, uncontrolled 12/08/2015  ? Sinus bradycardia 01/14/2014  ? Pericardial effusion 12/31/2013  ? Paroxysmal atrial fibrillation (HCC)   ? Supraventricular tachycardia ([place]) 10/17/2010  ? ? ?POD 7/5.  Pt with post op mesenteric bleed, s/p transfusions and washout of hematoma.  No active bleeding noted on f/u lap evaluation ? ?Plan: ?Cont fulls today, await return of bowel function, ensure supplements for nutrition ?Will try suppository today ?Minimize IVF's, good UOP ?Out of bed, ambulate in hall, appreciate PT help ?A fib: therapeutic lovenox until pt tolerating a reg diet.  Then will switch to home oral anticoagulation   ? ? LOS: 7 days  ? ? ? ?Marland KitchenRosario Adie, MD ?[facility], [place] ? ? ? ?05/27/2021 ?11:21 AM ? ? ? ?  ?

## 2021-05-27 NOTE — Progress Notes (Signed)
Physical Therapy Treatment ?Patient Details ?Name: [name] [name] ?MRN: [id number] ?DOB: 02/13/1941 ?Today's Date: 05/27/2021 ? ? ?History of Present Illness Pt is an 81 year old Caucasian male who had robotic transverse right-sided colectomy 2/24 came out of the OR hypotensive, H&H check showed that that he dropped from around 12-7.8 was brought down to the ICU for pressors.  Pt had post op bleed, s/p transfusions and washout of hematoma.  PMHx: afib, NSTEMI, HTN, SVT, DM, anemia, glaucoma ? ?  ?PT Comments  ? ? Pt making gradual progress.  He was able to increase gait distance but did have DOE of 2/4 (sats 98% on RA).  Provided stockinette and mesh underwear for scrotal sling but pt did not feel he needed today - does still have edema that limited but improving.  Continue to progress as able.  ?   ?Recommendations for follow up therapy are one component of a multi-disciplinary discharge planning process, led by the attending physician.  Recommendations may be updated based on patient status, additional functional criteria and insurance authorization. ? ?Follow Up Recommendations ? Home health PT ?  ?  ?Assistance Recommended at Discharge Set up Supervision/Assistance  ?Patient can return home with the following A little help with walking and/or transfers;A little help with bathing/dressing/bathroom;Help with stairs or ramp for entrance;Assistance with cooking/housework ?  ?Equipment Recommendations ? None recommended by PT  ?  ?Recommendations for Other Services   ? ? ?  ?Precautions / Restrictions Precautions ?Precautions: Fall  ?  ? ?Mobility ? Bed Mobility ?  ?  ?  ?  ?  ?  ?  ?General bed mobility comments: pt in recliner ?  ? ?Transfers ?Overall transfer level: Needs assistance ?Equipment used: Rolling walker (2 wheels) ?Transfers: Sit to/from Stand ?Sit to Stand: Min assist ?  ?  ?  ?  ?  ?General transfer comment: Cues to lean forward as able (limited due to scrotal edema) then use of mometum and heavy use of  UE; performed x 3 ?  ? ?Ambulation/Gait ?Ambulation/Gait assistance: Min guard ?Gait Distance (Feet): 400 Feet ?Assistive device: Rolling walker (2 wheels) ?Gait Pattern/deviations: Decreased stride length, Trunk flexed, Wide base of support ?Gait velocity: decreased ?  ?  ?General Gait Details: Cues for RW proximity ? ? ?Stairs ?  ?  ?  ?  ?  ? ? ?Wheelchair Mobility ?  ? ?Modified Rankin (Stroke Patients Only) ?  ? ? ?  ?Balance Overall balance assessment: Needs assistance ?Sitting-balance support: No upper extremity supported ?Sitting balance-Leahy Scale: Good ?  ?  ?Standing balance support: Bilateral upper extremity supported, No upper extremity supported ?Standing balance-Leahy Scale: Fair ?Standing balance comment: RW to ambulate but could static standwithout AD ?  ?  ?  ?  ?  ?  ?  ?  ?  ?  ?  ?  ? ?  ?Cognition Arousal/Alertness: Awake/alert ?Behavior During Therapy: [facility] for tasks assessed/performed ?Overall Cognitive Status: Within Functional Limits for tasks assessed ?  ?  ?  ?  ?  ?  ?  ?  ?  ?  ?  ?  ?  ?  ?  ?  ?General Comments: HOH ?  ?  ? ?  ?Exercises General Exercises - Lower Extremity ?Ankle Circles/Pumps: AROM, 10 reps, Both, Supine ?Long Arc Quad: AROM, Both, 10 reps, Seated ?Hip Flexion/Marching: AROM, Both, 10 reps, Seated ?Other Exercises ?Other Exercises: sit to stands painful; attempted mini-squats:despite max multimodal cues and demo for correct form pt  tends to flex more at trunk with limited knee flexion.  Denied knee pain, reports still hurts scrotum. ? ?  ?General Comments General comments (skin integrity, edema, etc.): Took mesh underwear and stockinette to make scrotal sling - pt reports does not feel that needs today.  Educated on how to use if needed ?  ?  ? ?Pertinent Vitals/Pain Pain Assessment ?Pain Assessment: Faces ?Faces Pain Scale: Hurts a little bit ?Pain Location: Scrotum ?Pain Descriptors / Indicators: Discomfort ?Pain Intervention(s): Limited activity within patient's  tolerance, Monitored during session  ? ? ?Home Living   ?  ?  ?  ?  ?  ?  ?  ?  ?  ?   ?  ?Prior Function    ?  ?  ?   ? ?PT Goals (current goals can now be found in the care plan section) Progress towards PT goals: Progressing toward goals ? ?  ?Frequency ? ? ? Min 3X/week ? ? ? ?  ?PT Plan Current plan remains appropriate  ? ? ?Co-evaluation   ?  ?  ?  ?  ? ?  ?AM-PAC PT "6 Clicks" Mobility   ?Outcome Measure ? Help needed turning from your back to your side while in a flat bed without using bedrails?: A Little ?Help needed moving from lying on your back to sitting on the side of a flat bed without using bedrails?: A Little ?Help needed moving to and from a bed to a chair (including a wheelchair)?: A Little ?Help needed standing up from a chair using your arms (e.g., wheelchair or bedside chair)?: A Little ?Help needed to walk in hospital room?: A Little ?Help needed climbing 3-5 steps with a railing? : A Little ?6 Click Score: 18 ? ?  ?End of Session Equipment Utilized During Treatment: Gait belt ?Activity Tolerance: Patient tolerated treatment well ?Patient left: in chair;with call bell/phone within reach;with family/visitor present ?Nurse Communication: Mobility status ?PT Visit Diagnosis: Difficulty in walking, not elsewhere classified (R26.2) ?  ? ? ?Time: 3235-5732 ?PT Time Calculation (min) (ACUTE ONLY): 25 min ? ?Charges:  $Gait Training: 8-22 mins ?$Therapeutic Exercise: 8-22 mins          ?          ? ?Abran Richard, PT ?Acute Rehab Services ?Pager [phone] ?[facility] [phone] ? ? ? ?Mikael Spray Herberth Deharo ?05/27/2021, 3:49 PM ? ?

## 2021-05-28 NOTE — Progress Notes (Signed)
6 Days Post-Op  ? ?Subjective/Chief Complaint: ?Passing flatus, small bm after suppository, one episode emesis after third ensure, ambulated in hall with pt, voiding ? ? ?Objective: ?Vital signs in last 24 hours: ?Temp:  [98.5 ?F (36.9 ?C)-99.4 ?F (37.4 ?C)] 99.4 ?F (37.4 ?C) (03/04 0427) ?Pulse Rate:  [72-85] 72 (03/04 0427) ?Resp:  [16-18] 18 (03/04 0427) ?BP: (139-154)/(61-65) 145/65 (03/04 0427) ?SpO2:  [94 %-97 %] 94 % (03/04 0427) ?Weight:  [106.4 kg] 106.4 kg (03/04 0400) ?Last BM Date : 05/27/21 ? ?Intake/Output from previous day: ?03/03 0701 - 03/04 0700 ?In: 360 [P.O.:360] ?Out: -  ?Intake/Output this shift: ?No intake/output data recorded. ? ?General: alert and cooperative, nad ?GI: soft, distended RLQ port site with some mild erythema, no drainage, ecchymosis abdominal wall and suprapubic ? ?Lab Results:  ?Recent Labs  ?  05/26/21 ?5409  ?WBC 11.6*  ?HGB 8.3*  ?HCT 26.1*  ?PLT 117*  ? ?BMET ?No results for input(s): NA, K, CL, CO2, GLUCOSE, BUN, CREATININE, CALCIUM in the last 72 hours. ?PT/INR ?No results for input(s): LABPROT, INR in the last 72 hours. ?ABG ?No results for input(s): PHART, HCO3 in the last 72 hours. ? ?Invalid input(s): PCO2, PO2 ? ?Studies/Results: ?No results found. ? ?Anti-infectives: ?Anti-infectives (From admission, onward)  ? ? Start     Dose/Rate Route Frequency Ordered Stop  ? 05/22/21 1801  sodium chloride 0.9 % with cefoTEtan (CEFOTAN) ADS Med       ?Note to Pharmacy: Eliberto Ivory: cabinet override  ?    05/22/21 1801 05/22/21 1805  ? 05/22/21 1759  ceFAZolin (ANCEF) 2-4 GM/100ML-% IVPB  Status:  Discontinued       ?Note to Pharmacy: Bryson Ha E: cabinet override  ?    05/22/21 1759 05/22/21 1804  ? 05/22/21 1759  sodium chloride 0.9 % with cefTRIAXone (ROCEPHIN) ADS Med  Status:  Discontinued       ?Note to Pharmacy: Eliberto Ivory: cabinet override  ?    05/22/21 1759 05/22/21 1804  ? 05/22/21 1200  ertapenem (INVANZ) 1,000 mg in sodium chloride 0.9 % 100 mL IVPB   Status:  Discontinued       ?Note to Pharmacy: Give in holding area immediately prior to procedure today.  ? 1 g ?200 mL/hr over 30 Minutes Intravenous To [facility] 05/22/21 1144 05/23/21 0853  ? 05/20/21 2245  cefoTEtan (CEFOTAN) 2 g in sodium chloride 0.9 % 100 mL IVPB       ? 2 g ?200 mL/hr over 30 Minutes Intravenous Every 12 hours 05/20/21 2152 05/21/21 0100  ? 05/20/21 1030  cefoTEtan (CEFOTAN) 2 g in sodium chloride 0.9 % 100 mL IVPB       ? 2 g ?200 mL/hr over 30 Minutes Intravenous On call to O.R. 05/20/21 1020 05/20/21 1142  ? ?  ? ? ?Assessment/Plan: ?POD 8/6  robot extended right/Pt with post op mesenteric bleed, s/p transfusions and washout of hematoma.  No active bleeding noted on f/u lap evaluation ?-would like to try soft diet and think reasonable, still has some of resolving ileus, continue supplements ?-oob, ambulate ?Will continue lovenox for now, labs look fine today, will recheck Monday unless change and can transition to eliquis prior to dc ? ? ?Rolm Bookbinder ?05/28/2021 ? ?

## 2021-05-28 NOTE — Plan of Care (Signed)
Pt alert and oriented x 4. Ambulates in room. No bleeding noted this shift. 1 episode of nausea and vomiting zofran given. Prn tramadol and benadryl given.  ?Problem: Education: ?Goal: Knowledge of General Education information will improve ?Description: Including pain rating scale, medication(s)/side effects and non-pharmacologic comfort measures ?05/28/2021 0656 by Nicholes Calamity, RN ?Outcome: Progressing ? ?  ?Problem: Health Behavior/Discharge Planning: ?Goal: Ability to manage health-related needs will improve ?05/28/2021 0656 by Nicholes Calamity, RN ?Outcome: Progressing ? ?Problem: Clinical Measurements: ?Goal: Ability to maintain clinical measurements within normal limits will improve ?05/28/2021 0656 by Nicholes Calamity, RN ?Outcome: Progressing ? ?Goal: Will remain free from infection ?05/28/2021 0656 by Nicholes Calamity, RN ?Outcome: Progressing ? ?Goal: Diagnostic test results will improve ?05/28/2021 0656 by Nicholes Calamity, RN ?Outcome: Progressing ? ?Goal: Respiratory complications will improve ?05/28/2021 0656 by Nicholes Calamity, RN ?Outcome: Progressing ? ?Goal: Cardiovascular complication will be avoided ?05/28/2021 0656 by Nicholes Calamity, RN ?Outcome: Progressing ? ?  ?Problem: Activity: ?Goal: Risk for activity intolerance will decrease ?05/28/2021 0656 by Nicholes Calamity, RN ?Outcome: Progressing ? ?  ?Problem: Nutrition: ?Goal: Adequate nutrition will be maintained ?05/28/2021 0656 by Nicholes Calamity, RN ?Outcome: Progressing ? ?Problem: Coping: ?Goal: Level of anxiety will decrease ?05/28/2021 0656 by Nicholes Calamity, RN ?Outcome: Progressing ? ?  ?Problem: Elimination: ?Goal: Will not experience complications related to bowel motility ?05/28/2021 0656 by Nicholes Calamity, RN ?Outcome: Progressing ? ?Goal: Will not experience complications related to urinary retention ?05/28/2021 0656 by Nicholes Calamity, RN ?Outcome: Progressing ? ?  ?Problem: Pain Managment: ?Goal: General experience of comfort will  improve ?05/28/2021 0656 by Nicholes Calamity, RN ?Outcome: Progressing ? ?  ?Problem: Safety: ?Goal: Ability to remain free from injury will improve ?05/28/2021 0656 by Nicholes Calamity, RN ?Outcome: Progressing ? ?Problem: Skin Integrity: ?Goal: Risk for impaired skin integrity will decrease ?05/28/2021 0656 by Nicholes Calamity, RN ?Outcome: Progressing ? ?  ?

## 2021-05-29 NOTE — Progress Notes (Signed)
7 Days Post-Op  ? ?Subjective/Chief Complaint: ?Doing great today, having bms, tol diet, no n/v, ambulating in room ? ? ?Objective: ?Vital signs in last 24 hours: ?Temp:  [97.6 ?F (36.4 ?C)-98.4 ?F (36.9 ?C)] 98.4 ?F (36.9 ?C) (03/05 6948) ?Pulse Rate:  [73-82] 73 (03/05 0643) ?Resp:  [18] 18 (03/04 2206) ?BP: (139-161)/(60-95) 139/93 (03/05 5462) ?SpO2:  [96 %-99 %] 96 % (03/05 0643) ?Last BM Date : 05/27/21 ? ?Intake/Output from previous day: ?03/04 0701 - 03/05 0700 ?In: 240 [P.O.:240] ?Out: 500 [Urine:500] ?Intake/Output this shift: ?No intake/output data recorded. ? ?General: alert and cooperative, nad ?GI: soft, distended RLQ port site with some mild erythema unchanged, no drainage, ecchymosis abdominal wall and suprapubic unchanged ? ?Lab Results:  ?No results for input(s): WBC, HGB, HCT, PLT in the last 72 hours. ?BMET ?No results for input(s): NA, K, CL, CO2, GLUCOSE, BUN, CREATININE, CALCIUM in the last 72 hours. ?PT/INR ?No results for input(s): LABPROT, INR in the last 72 hours. ?ABG ?No results for input(s): PHART, HCO3 in the last 72 hours. ? ?Invalid input(s): PCO2, PO2 ? ?Studies/Results: ?No results found. ? ?Anti-infectives: ?Anti-infectives (From admission, onward)  ? ? Start     Dose/Rate Route Frequency Ordered Stop  ? 05/22/21 1801  sodium chloride 0.9 % with cefoTEtan (CEFOTAN) ADS Med       ?Note to Pharmacy: Eliberto Ivory: cabinet override  ?    05/22/21 1801 05/22/21 1805  ? 05/22/21 1759  ceFAZolin (ANCEF) 2-4 GM/100ML-% IVPB  Status:  Discontinued       ?Note to Pharmacy: Bryson Ha E: cabinet override  ?    05/22/21 1759 05/22/21 1804  ? 05/22/21 1759  sodium chloride 0.9 % with cefTRIAXone (ROCEPHIN) ADS Med  Status:  Discontinued       ?Note to Pharmacy: Eliberto Ivory: cabinet override  ?    05/22/21 1759 05/22/21 1804  ? 05/22/21 1200  ertapenem (INVANZ) 1,000 mg in sodium chloride 0.9 % 100 mL IVPB  Status:  Discontinued       ?Note to Pharmacy: Give in holding area  immediately prior to procedure today.  ? 1 g ?200 mL/hr over 30 Minutes Intravenous To ShortStay [facility] 05/22/21 1144 05/23/21 0853  ? 05/20/21 2245  cefoTEtan (CEFOTAN) 2 g in sodium chloride 0.9 % 100 mL IVPB       ? 2 g ?200 mL/hr over 30 Minutes Intravenous Every 12 hours 05/20/21 2152 05/21/21 0100  ? 05/20/21 1030  cefoTEtan (CEFOTAN) 2 g in sodium chloride 0.9 % 100 mL IVPB       ? 2 g ?200 mL/hr over 30 Minutes Intravenous On call to O.R. 05/20/21 1020 05/20/21 1142  ? ?  ? ? ?Assessment/Plan: ?POD 9/7  robot extended right/Pt with post op mesenteric bleed, s/p transfusions and washout of hematoma.  No active bleeding noted on f/u lap evaluation ?-appears to have resolved ileus, continue soft diet ?-oob, ambulate ?-hhpt needs to be setup, I wrote for TOC to see for this ?-Will continue lovenox for now,  will recheck HB Monday unless change and can transition to eliquis prior to dc ?-discussed likely dc tomorrow ?-area on llq port site really unchanged from friday ? ?Rolm Bookbinder ?05/29/2021 ? ?

## 2021-05-29 NOTE — Progress Notes (Signed)
?   05/29/21 1100  ?Mobility  ?Activity Ambulated with assistance in hallway  ?Level of Assistance Standby assist, set-up cues, supervision of patient - no hands on  ?Assistive Device None;Front wheel walker  ?Distance Ambulated (ft) 400 ft  ?Activity Response Tolerated well  ?$Mobility charge 1 Mobility  ? ?Pt agreeable to mobilize this morning. Ambulated about 371f in hall with RW and 1016fwith no AD, tolerated well. No complaints. Left pt in chair, call bell at side. RN/NT notified of session.  ? ?KeRudell Cobb?Mobility Specialist ?Acute [facility] Services ?Office: [phone] ? ?

## 2021-05-29 NOTE — TOC Progression Note (Signed)
Transition of Care (TOC) - Progression Note  ? ? ?Patient Details  ?Name: [name] [name] ?MRN: [id number] ?Date of Birth: 1940/11/30 ? ?Transition of Care (TOC) CM/SW Contact  ?Huyen Perazzo, Marta Lamas, LCSW ?Phone Number: ?05/29/2021, 11:05 AM ? ?Clinical Narrative:  ? ?Plan is for patient to return home with home health services.  Home Health Physical Therapy arranged through [facility] with [facility] [phone]), formerly [facility]  ? ?Barriers to Discharge: Continued Medical Work up ? ?Expected Discharge Plan and Services ? ?Home with HHPT. ? ? ?Social Determinants of Health (SDOH) Interventions ?  ? ?Readmission Risk Interventions ?No flowsheet data found. ? ?

## 2021-05-30 LAB — CBC
HCT: 27 % — ABNORMAL LOW (ref 39.0–52.0)
Hemoglobin: 8.7 g/dL — ABNORMAL LOW (ref 13.0–17.0)
MCH: 31.2 pg (ref 26.0–34.0)
MCHC: 32.2 g/dL (ref 30.0–36.0)
MCV: 96.8 fL (ref 80.0–100.0)
Platelets: 221 10*3/uL (ref 150–400)
RBC: 2.79 MIL/uL — ABNORMAL LOW (ref 4.22–5.81)
RDW: 19.2 % — ABNORMAL HIGH (ref 11.5–15.5)
WBC: 12.6 10*3/uL — ABNORMAL HIGH (ref 4.0–10.5)
nRBC: 0.2 % (ref 0.0–0.2)

## 2021-05-30 MED ORDER — TRAMADOL HCL 50 MG PO TABS: 50.0000 mg | ORAL_TABLET | Freq: Four times a day (QID) | ORAL | 0 refills | Status: DC | PRN

## 2021-05-30 MED ORDER — APIXABAN 5 MG PO TABS
5.0000 mg | ORAL_TABLET | Freq: Two times a day (BID) | ORAL | Status: DC
  Administered 2021-05-30: 5 mg via ORAL
  Filled 2021-05-30: qty 1

## 2021-05-30 NOTE — Care Management Important Message (Signed)
Important Message ? ?Patient Details IM Letter placed in Patients room. ?Name: [name] [name] ?MRN: [id number] ?Date of Birth: 1940-05-23 ? ? ?Medicare Important Message Given:  Yes ? ? ? ? ?Kerin Salen ?05/30/2021, 9:43 AM ?

## 2021-05-30 NOTE — Discharge Instructions (Signed)
SURGERY: POST OP INSTRUCTIONS (Surgery for small bowel obstruction, colon resection, etc)   ######################################################################  EAT Gradually transition to a high fiber diet with a fiber supplement over the next few days after discharge  WALK Walk an hour a day.  Control your pain to do that.    CONTROL PAIN Control pain so that you can walk, sleep, tolerate sneezing/coughing, go up/down stairs.  HAVE A BOWEL MOVEMENT DAILY Keep your bowels regular to avoid problems.  OK to try a laxative to override constipation.  OK to use an antidairrheal to slow down diarrhea.  Call if not better after 2 tries  CALL IF YOU HAVE PROBLEMS/CONCERNS Call if you are still struggling despite following these instructions. Call if you have concerns not answered by these instructions  ######################################################################   DIET Follow a light diet the first few days at home.  Start with a bland diet such as soups, liquids, starchy foods, low fat foods, etc.  If you feel full, bloated, or constipated, stay on a ful liquid or pureed/blenderized diet for a few days until you feel better and no longer constipated. Be sure to drink plenty of fluids every day to avoid getting dehydrated (feeling dizzy, not urinating, etc.). Gradually add a fiber supplement to your diet over the next week.  Gradually get back to a regular solid diet.  Avoid fast food or heavy meals the first week as you are more likely to get nauseated. It is expected for your digestive tract to need a few months to get back to normal.  It is common for your bowel movements and stools to be irregular.  You will have occasional bloating and cramping that should eventually fade away.  Until you are eating solid food normally, off all pain medications, and back to regular activities; your bowels will not be normal. Focus on eating a low-fat, high fiber diet the rest of your life  (See Getting to Good Bowel Health, below).  CARE of your INCISION or WOUND  It is good for closed incisions and even open wounds to be washed every day.  Shower every day.  Short baths are fine.  Wash the incisions and wounds clean with soap & water.    You may leave closed incisions open to air if it is dry.   You may cover the incision with clean gauze & replace it after your daily shower for comfort.  STAPLES: You have skin staples.  Leave them in place & set up an appointment for them to be removed by a surgery office nurse ~10 days after surgery. = 1st week of January 2024    ACTIVITIES as tolerated Start light daily activities --- self-care, walking, climbing stairs-- beginning the day after surgery.  Gradually increase activities as tolerated.  Control your pain to be active.  Stop when you are tired.  Ideally, walk several times a day, eventually an hour a day.   Most people are back to most day-to-day activities in a few weeks.  It takes 4-8 weeks to get back to unrestricted, intense activity. If you can walk 30 minutes without difficulty, it is safe to try more intense activity such as jogging, treadmill, bicycling, low-impact aerobics, swimming, etc. Save the most intensive and strenuous activity for last (Usually 4-8 weeks after surgery) such as sit-ups, heavy lifting, contact sports, etc.  Refrain from any intense heavy lifting or straining until you are off narcotics for pain control.  You will have off days, but things should improve   week-by-week. DO NOT PUSH THROUGH PAIN.  Let pain be your guide: If it hurts to do something, don't do it.  Pain is your body warning you to avoid that activity for another week until the pain goes down. You may drive when you are no longer taking narcotic prescription pain medication, you can comfortably wear a seatbelt, and you can safely make sudden turns/stops to protect yourself without hesitating due to pain. You may have sexual intercourse when it  is comfortable. If it hurts to do something, stop.  MEDICATIONS Take your usually prescribed home medications unless otherwise directed.   Blood thinners:  Usually you can restart any strong blood thinners after the second postoperative day.  It is OK to take aspirin right away.     If you are on strong blood thinners (warfarin/Coumadin, Plavix, Xerelto, Eliquis, Pradaxa, etc), discuss with your surgeon, medicine PCP, and/or cardiologist for instructions on when to restart the blood thinner & if blood monitoring is needed (PT/INR blood check, etc).     PAIN CONTROL Pain after surgery or related to activity is often due to strain/injury to muscle, tendon, nerves and/or incisions.  This pain is usually short-term and will improve in a few months.  To help speed the process of healing and to get back to regular activity more quickly, DO THE FOLLOWING THINGS TOGETHER: Increase activity gradually.  DO NOT PUSH THROUGH PAIN Use Ice and/or Heat Try Gentle Massage and/or Stretching Take over the counter pain medication Take Narcotic prescription pain medication for more severe pain  Good pain control = faster recovery.  It is better to take more medicine to be more active than to stay in bed all day to avoid medications.  Increase activity gradually Avoid heavy lifting at first, then increase to lifting as tolerated over the next 6 weeks. Do not "push through" the pain.  Listen to your body and avoid positions and maneuvers than reproduce the pain.  Wait a few days before trying something more intense Walking an hour a day is encouraged to help your body recover faster and more safely.  Start slowly and stop when getting sore.  If you can walk 30 minutes without stopping or pain, you can try more intense activity (running, jogging, aerobics, cycling, swimming, treadmill, sex, sports, weightlifting, etc.) Remember: If it hurts to do it, then don't do it! Use Ice and/or Heat You will have swelling and  bruising around the incisions.  This will take several weeks to resolve. Ice packs or heating pads (6-8 times a day, 30-60 minutes at a time) will help sooth soreness & bruising. Some people prefer to use ice alone, heat alone, or alternate between ice & heat.  Experiment and see what works best for you.  Consider trying ice for the first few days to help decrease swelling and bruising; then, switch to heat to help relax sore spots and speed recovery. Shower every day.  Short baths are fine.  It feels good!  Keep the incisions and wounds clean with soap & water.   Try Gentle Massage and/or Stretching Massage at the area of pain many times a day Stop if you feel pain - do not overdo it Take over the counter pain medication This helps the muscle and nerve tissues become less irritable and calm down faster Choose ONE of the following over-the-counter anti-inflammatory medications: Acetaminophen 500mg tabs (Tylenol) 1-2 pills with every meal and just before bedtime (avoid if you have liver problems or if you have   acetaminophen in you narcotic prescription) Naproxen 220mg tabs (ex. Aleve, Naprosyn) 1-2 pills twice a day (avoid if you have kidney, stomach, IBD, or bleeding problems) Ibuprofen 200mg tabs (ex. Advil, Motrin) 3-4 pills with every meal and just before bedtime (avoid if you have kidney, stomach, IBD, or bleeding problems) Take with food/snack several times a day as directed for at least 2 weeks to help keep pain / soreness down & more manageable. Take Narcotic prescription pain medication for more severe pain A prescription for strong pain control is often given to you upon discharge (for example: oxycodone/Percocet, hydrocodone/Norco/Vicodin, or tramadol/Ultram) Take your pain medication as prescribed. Be mindful that most narcotic prescriptions contain Tylenol (acetaminophen) as well - avoid taking too much Tylenol. If you are having problems/concerns with the prescription medicine (does  not control pain, nausea, vomiting, rash, itching, etc.), please call us [phone] to see if we need to switch you to a different pain medicine that will work better for you and/or control your side effects better. If you need a refill on your pain medication, you must call the office before 4 pm and on weekdays only.  By federal law, prescriptions for narcotics cannot be called into a pharmacy.  They must be filled out on paper & picked up from our office by the patient or authorized caretaker.  Prescriptions cannot be filled after 4 pm nor on weekends.    WHEN TO CALL US [phone] Severe uncontrolled or worsening pain  Fever over 101 F (38.5 C) Concerns with the incision: Worsening pain, redness, rash/hives, swelling, bleeding, or drainage Reactions / problems with new medications (itching, rash, hives, nausea, etc.) Nausea and/or vomiting Difficulty urinating Difficulty breathing Worsening fatigue, dizziness, lightheadedness, blurred vision Other concerns If you are not getting better after two weeks or are noticing you are getting worse, contact our office [phone] for further advice.  We may need to adjust your medications, re-evaluate you in the office, send you to the emergency room, or see what other things we can do to help. The clinic staff is available to answer your questions during regular business hours (8:30am-5pm).  Please don't hesitate to call and ask to speak to one of our nurses for clinical concerns.    A surgeon from [facility] is always on call at the hospitals 24 hours/day If you have a medical emergency, go to the nearest emergency room or call 911.  FOLLOW UP in our office One the day of your discharge from the hospital (or the next business weekday), please call [facility] to set up or confirm an appointment to see your surgeon in the office for a follow-up appointment.  Usually it is 2-3 weeks after your surgery.   If you  have skin staples at your incision(s), let the office know so we can set up a time in the office for the nurse to remove them (usually around 10 days after surgery). Make sure that you call for appointments the day of discharge (or the next business weekday) from the hospital to ensure a convenient appointment time. IF YOU HAVE DISABILITY OR FAMILY LEAVE FORMS, BRING THEM TO THE OFFICE FOR PROCESSING.  DO NOT GIVE THEM TO YOUR DOCTOR.  [facility] [address], [address], [place], [place]  [address] ? [phone] - Main [phone] - Toll Free,  [phone] - Fax [email or web address]    GETTING TO GOOD BOWEL HEALTH. It is expected for your digestive tract to   need a few months to get back to normal.  It is common for your bowel movements and stools to be irregular.  You will have occasional bloating and cramping that should eventually fade away.  Until you are eating solid food normally, off all pain medications, and back to regular activities; your bowels will not be normal.   Avoiding constipation The goal: ONE SOFT BOWEL MOVEMENT A DAY!    Drink plenty of fluids.  Choose water first. TAKE A FIBER SUPPLEMENT EVERY DAY THE REST OF YOUR LIFE During your first week back home, gradually add back a fiber supplement every day Experiment which form you can tolerate.   There are many forms such as powders, tablets, wafers, gummies, etc Psyllium bran (Metamucil), methylcellulose (Citrucel), Miralax or Glycolax, Benefiber, Flax Seed.  Adjust the dose week-by-week (1/2 dose/day to 6 doses a day) until you are moving your bowels 1-2 times a day.  Cut back the dose or try a different fiber product if it is giving you problems such as diarrhea or bloating. Sometimes a laxative is needed to help jump-start bowels if constipated until the fiber supplement can help regulate your bowels.  If you are tolerating eating & you are farting, it is okay to try a gentle  laxative such as double dose MiraLax, prune juice, or Milk of Magnesia.  Avoid using laxatives too often. Stool softeners can sometimes help counteract the constipating effects of narcotic pain medicines.  It can also cause diarrhea, so avoid using for too long. If you are still constipated despite taking fiber daily, eating solids, and a few doses of laxatives, call our office. Controlling diarrhea Try drinking liquids and eating bland foods for a few days to avoid stressing your intestines further. Avoid dairy products (especially milk & ice cream) for a short time.  The intestines often can lose the ability to digest lactose when stressed. Avoid foods that cause gassiness or bloating.  Typical foods include beans and other legumes, cabbage, broccoli, and dairy foods.  Avoid greasy, spicy, fast foods.  Every person has some sensitivity to other foods, so listen to your body and avoid those foods that trigger problems for you. Probiotics (such as active yogurt, Align, etc) may help repopulate the intestines and colon with normal bacteria and calm down a sensitive digestive tract Adding a fiber supplement gradually can help thicken stools by absorbing excess fluid and retrain the intestines to act more normally.  Slowly increase the dose over a few weeks.  Too much fiber too soon can backfire and cause cramping & bloating. It is okay to try and slow down diarrhea with a few doses of antidiarrheal medicines.   Bismuth subsalicylate (ex. Kayopectate, Pepto Bismol) for a few doses can help control diarrhea.  Avoid if pregnant.   Loperamide (Imodium) can slow down diarrhea.  Start with one tablet (2mg) first.  Avoid if you are having fevers or severe pain.  ILEOSTOMY PATIENTS WILL HAVE CHRONIC DIARRHEA since their colon is not in use.    Drink plenty of liquids.  You will need to drink even more glasses of water/liquid a day to avoid getting dehydrated. Record output from your ileostomy.  Expect to empty  the bag every 3-4 hours at first.  Most people with a permanent ileostomy empty their bag 4-6 times at the least.   Use antidiarrheal medicine (especially Imodium) several times a day to avoid getting dehydrated.  Start with a dose at bedtime & breakfast.  Adjust up or   down as needed.  Increase antidiarrheal medications as directed to avoid emptying the bag more than 8 times a day (every 3 hours). Work with your wound ostomy nurse to learn care for your ostomy.  See ostomy care instructions. TROUBLESHOOTING IRREGULAR BOWELS 1) Start with a soft & bland diet. No spicy, greasy, or fried foods.  2) Avoid gluten/wheat or dairy products from diet to see if symptoms improve. 3) Miralax 17gm or flax seed mixed in 8oz. water or juice-daily. May use 2-4 times a day as needed. 4) Gas-X, Phazyme, etc. as needed for gas & bloating.  5) Prilosec (omeprazole) over-the-counter as needed 6)  Consider probiotics (Align, Activa, etc) to help calm the bowels down  Call your doctor if you are getting worse or not getting better.  Sometimes further testing (cultures, endoscopy, X-ray studies, CT scans, bloodwork, etc.) may be needed to help diagnose and treat the cause of the diarrhea. [facility] [address], [address], [place], [place]  [address] [phone] - Main.    [phone]  - Toll Free.   [phone] - Fax [email or web address]   ###############################   #######################################################  Ostomy Support Information  You've heard that people get along just fine with only one of their eyes, or one of their lungs, or one of their kidneys. But you also know that you have only one intestine and only one bladder, and that leaves you feeling awfully empty, both physically and emotionally: You think no other people go around without part of their intestine with the ends of their intestines sticking out through their abdominal walls.    YOU ARE NOT ALONE.  There are nearly three quarters of a million people in the [place] who have an ostomy; people who have had surgery to remove all or part of their colons or bladders.   There is even a national association, the [employer] with over 350 local affiliated support groups that are organized by volunteers who provide peer support and counseling. [employer] has a toll free telephone num-ber, 800-826-0826 and an educational, interactive website, www.ostomy.org   An ostomy is an opening in the belly (abdominal wall) made by surgery. Ostomates are people who have had this procedure. The opening (stoma) allows the kidney or bowel to grdischarge waste. An external pouch covers the stoma to collect waste. Pouches are are a simple bag and are odor free. Different companies have disposable or reusable pouches to fit one's lifestyle. An ostomy can either be temporary or permanent.   THERE ARE THREE MAIN TYPES OF OSTOMIES Colostomy. A colostomy is a surgically created opening in the large intestine (colon). Ileostomy. An ileostomy is a surgically created opening in the small intestine. Urostomy. A urostomy is a surgically created opening to divert urine away from the bladder.  OSTOMY Care  The following guidelines will make care of your colostomy easier. Keep this information close by for quick reference.  Helpful DIET hints Eat a well-balanced diet including vegetables and fresh fruits. Eat on a regular schedule.  Drink at least 6 to 8 glasses of fluids daily. Eat slowly in a relaxed atmosphere. Chew your food thoroughly. Avoid chewing gum, smoking, and drinking from a straw. This will help decrease the amount of air you swallow, which may help reduce gas. Eating yogurt or drinking buttermilk may help reduce gas.  To control gas at night, do not eat after 8 p.m. This will give your bowel time to quiet down before you go   to bed.  If gas is a problem, you can purchase  Beano. Sprinkle Beano on the first bite of food before eating to reduce gas. It has no flavor and should not change the taste of your food. You can buy Beano over the counter at your local drugstore.  Foods like fish, onions, garlic, broccoli, asparagus, and cabbage produce odor. Although your pouch is odor-proof, if you eat these foods you may notice a stronger odor when emptying your pouch. If this is a concern, you may want to limit these foods in your diet.  If you have an ileostomy, you will have chronic diarrhea & need to drink more liquids to avoid getting dehydrated.  Consider antidiarrheal medicine like imodium (loperamide) or Lomotil to help slow down bowel movements / diarrhea into your ileostomy bag.  GETTING TO GOOD BOWEL HEALTH WITH AN ILEOSTOMY    With the colon bypassed & not in use, you will have small bowel diarrhea.   It is important to thicken & slow your bowel movements down.   The goal: 4-6 small BOWEL MOVEMENTS A DAY It is important to drink plenty of liquids to avoid getting dehydrated  CONTROLLING ILEOSTOMY DIARRHEA  TAKE A FIBER SUPPLEMENT (FiberCon or Benefiner soluble fiber) twice a day - to thicken stools by absorbing excess fluid and retrain the intestines to act more normally.  Slowly increase the dose over a few weeks.  Too much fiber too soon can backfire and cause cramping & bloating.  TAKE AN IRON SUPPLEMENT twice a day to naturally constipate your bowels.  Usually ferrous sulfate 325mg twice a day)  TAKE ANTI-DIARRHEAL MEDICINES: Loperamide (Imodium) can slow down diarrhea.  Start with two tablets (= 4mg) first and then try one tablet every 6 hours.  Can go up to 2 pills four times day (8 pills of 2mg max) Avoid if you are having fevers or severe pain.  If you are not better or start feeling worse, stop all medicines and call your doctor for advice LoMotil (Diphenoxylate / Atropine) is another medicine that can constipate & slow down bowel moevements Pepto  Bismol (bismuth) can gently thicken bowels as well  If diarrhea is worse,: drink plenty of liquids and try simpler foods for a few days to avoid stressing your intestines further. Avoid dairy products (especially milk & ice cream) for a short time.  The intestines often can lose the ability to digest lactose when stressed. Avoid foods that cause gassiness or bloating.  Typical foods include beans and other legumes, cabbage, broccoli, and dairy foods.  Every person has some sensitivity to other foods, so listen to our body and avoid those foods that trigger problems for you.Call your doctor if you are getting worse or not better.  Sometimes further testing (cultures, endoscopy, X-ray studies, bloodwork, etc) may be needed to help diagnose and treat the cause of the diarrhea. Take extra anti-diarrheal medicines (maximum is 8 pills of 2mg loperamide a day)   Tips for POUCHING an OSTOMY   Changing Your Pouch The best time to change your pouch is in the morning, before eating or drinking anything. Your stoma can function at any time, but it will function more after eating or drinking.   Applying the pouching system  Place all your equipment close at hand before removing your pouch.  Wash your hands.  Stand or sit in front of a mirror. Use the position that works best for you. Remember that you must keep the skin around the stoma   wrinkle-free for a good seal.  Gently remove the used pouch (1-piece system) or the pouch and old wafer (2-piece system). Empty the pouch into the toilet. Save the closure clip to use again.  Wash the stoma itself and the skin around the stoma. Your stoma may bleed a little when being washed. This is normal. Rinse and pat dry. You may use a wash cloth or soft paper towels (like Bounty), mild soap (like Dial, Safeguard, or Ivory), and water. Avoid soaps that contain perfumes or lotions.  For a new pouch (1-piece system) or a new wafer (2-piece system), measure your  stoma using the stoma guide in each box of supplies.  Trace the shape of your stoma onto the back of the new pouch or the back of the new wafer. Cut out the opening. Remove the paper backing and set it aside.  Optional: Apply a skin barrier powder to surrounding skin if it is irritated (bare or weeping), and dust off the excess. Optional: Apply a skin-prep wipe (such as Skin Prep or All-Kare) to the skin around the stoma, and let it dry. Do not apply this solution if the skin is irritated (red, tender, or broken) or if you have shaved around the stoma. Optional: Apply a skin barrier paste (such as Stomahesive, Coloplast, or Premium) around the opening cut in the back of the pouch or wafer. Allow it to dry for 30 to 60 seconds.  Hold the pouch (1-piece system) or wafer (2-piece system) with the sticky side toward your body. Make sure the skin around the stoma is wrinkle-free. Center the opening on the stoma, then press firmly to your abdomen (Fig. 4). Look in the mirror to check if you are placing the pouch, or wafer, in the right position. For a 2-piece system, snap the pouch onto the wafer. Make sure it snaps into place securely.  Place your hand over the stoma and the pouch or wafer for about 30 seconds. The heat from your hand can help the pouch or wafer stick to your skin.  Add deodorant (such as Super Banish or Nullo) to your pouch. Other options include food extracts such as vanilla oil and peppermint extract. Add about 10 drops of the deodorant to the pouch. Then apply the closure clamp. Note: Do not use toxic  chemicals or commercial cleaning agents in your pouch. These substances may harm the stoma.  Optional: For extra seal, apply tape to all 4 sides around the pouch or wafer, as if you were framing a picture. You may use any brand of medical adhesive tape. Change your pouch every 5 to 7 days. Change it immediately if a leak occurs.  Wash your hands afterwards.  If you are wearing a  2-piece system, you may use 2 new pouches per week and alternate them. Rinse the pouch with mild soap and warm water and hang it to dry for the next day. Apply the fresh pouch. Alternate the 2 pouches like this for a week. After a week, change the wafer and begin with 2 new pouches. Place the old pouches in a plastic bag, and put them in the trash.   LIVING WITH AN OSTOMY  Emptying Your Pouch Empty your pouch when it is one-third full (of urine, stool, and/or gas). If you wait until your pouch is fuller than this, it will be more difficult to empty and more noticeable. When you empty your pouch, either put toilet paper in the toilet bowl first, or flush the   toilet while you empty the pouch. This will reduce splashing. You can empty the pouch between your legs or to one side while sitting, or while standing or stooping. If you have a 2-piece system, you can snap off the pouch to empty it. Remember that your stoma may function during this time. If you wish to rinse your pouch after you empty it, a turkey baster can be helpful. When using a baster, squirt water up into the pouch through the opening at the bottom. With a 2-piece system, you can snap off the pouch to rinse it. After rinsing  your pouch, empty it into the toilet. When rinsing your pouch at home, put a few granules of Dreft soap in the rinse water. This helps lubricate and freshen your pouch. The inside of your pouch can be sprayed with non-stick cooking oil (Pam spray). This may help reduce stool sticking to the inside of the pouch.  Bathing You may shower or bathe with your pouch on or off. Remember that your stoma may function during this time.  The materials you use to wash your stoma and the skin around it should be clean, but they do not need to be sterile.  Wearing Your Pouch During hot weather, or if you perspire a lot in general, wear a cover over your pouch. This may prevent a rash on your skin under the pouch. Pouch covers are  sold at ostomy supply stores. Wear the pouch inside your underwear for better support. Watch your weight. Any gain or loss of 10 to 15 pounds or more can change the way your pouch fits.  Going Away From Home A collapsible cup (like those that come in travel kits) or a soft plastic squirt bottle with a pull-up top (like a travel bottle for shampoo) can be used for rinsing your pouch when you are away from home. Tilt the opening of the pouch at an upward angle when using a cup to rinse.  Carry wet wipes or extra tissues to use in public bathrooms.  Carry an extra pouching system with you at all times.  Never keep ostomy supplies in the glove compartment of your car. Extreme heat or cold can damage the skin barriers and adhesive wafers on the pouch.  When you travel, carry your ostomy supplies with you at all times. Keep them within easy reach. Do not pack ostomy supplies in baggage that will be checked or otherwise separated from you, because your baggage might be lost. If you're traveling out of the country, it is helpful to have a letter stating that you are carrying ostomy supplies as a medical necessity.  If you need ostomy supplies while traveling, look in the yellow pages of the telephone book under "Surgical Supplies." Or call the local ostomy organization to find out where supplies are available.  Do not let your ostomy supplies get low. Always order new pouches before you use the last one.  Reducing Odor Limit foods such as broccoli, cabbage, onions, fish, and garlic in your diet to help reduce odor. Each time you empty your pouch, carefully clean the opening of the pouch, both inside and outside, with toilet paper. Rinse your pouch 1 or 2 times daily after you empty it (see directions for emptying your pouch and going away from home). Add deodorant (such as Super Banish or Nullo) to your pouch. Use air deodorizers in your bathroom. Do not add aspirin to your pouch. Even though  aspirin can help prevent odor, it   could cause ulcers on your stoma.  When to call the doctor Call the doctor if you have any of the following symptoms: Purple, black, or white stoma Severe cramps lasting more than 6 hours Severe watery discharge from the stoma lasting more than 6 hours No output from the colostomy for 3 days Excessive bleeding from your stoma Swelling of your stoma to more than 1/2-inch larger than usual Pulling inward of your stoma below skin level Severe skin irritation or deep ulcers Bulging or other changes in your abdomen  When to call your ostomy nurse Call your ostomy/enterostomal therapy (WOCN) nurse if any of the following occurs: Frequent leaking of your pouching system Change in size or appearance of your stoma, causing discomfort or problems with your pouch Skin rash or rawness Weight gain or loss that causes problems with your pouch     FREQUENTLY ASKED QUESTIONS   Why haven't you met any of these folks who have an ostomy?  Well, maybe you have! You just did not recognize them because an ostomy doesn't show. It can be kept secret if you wish. Why, maybe some of your best friends, office associates or neighbors have an ostomy ... you never can tell. People facing ostomy surgery have many quality-of-life questions like: Will you bulge? Smell? Make noises? Will you feel waste leaving your body? Will you be a captive of the toilet? Will you starve? Be a social outcast? Get/stay married? Have babies? Easily bathe, go swimming, bend over?  OK, let's look at what you can expect:   Will you bulge?  Remember, without part of the intestine or bladder, and its contents, you should have a flatter tummy than before. You can expect to wear, with little exception, what you wore before surgery ... and this in-cludes tight clothing and bathing suits.   Will you smell?  Today, thanks to modern odor proof pouching systems, you can walk into an ostomy support group  meeting and not smell anything that is foul or offensive. And, for those with an ileostomy or colostomy who are concerned about odor when emptying their pouch, there are in-pouch deodorants that can be used to eliminate any waste odors that may exist.   Will you make noises?  Everyone produces gas, especially if they are an air-swallower. But intestinal sounds that occur from time to time are no differ-ent than a gurgling tummy, and quite often your clothing will muffle any sounds.   Will you feel the waste discharges?  For those with a colostomy or ileostomy there might be a slight pressure when waste leaves your body, but understand that the intestines have no nerve endings, so there will be no unpleasant sensations. Those with a urostomy will probably be unaware of any kidney drainage.   Will you be a captive of the toilet?  Immediately post-op you will spend more time in the bathroom than you will after your body recovers from surgery. Every person is different, but on average those with an ileostomy or urostomy may empty their pouches 4 to 6 times a day; a little  less if you have a colostomy. The average wear time between pouch system changes is 3 to 5 days and the changing process should take less than 30 minutes.   Will I need to be on a special diet? Most people return to their normal diet when they have recovered from surgery. Be sure to chew your food well, eat a well-balanced diet and drink plenty of fluids. If   you experience problems with a certain food, wait a couple of weeks and try it again.  Will there be odor and noises? Pouching systems are designed to be odor-proof or odor-resistant. There are deodorants that can be used in the pouch. Medications are also available to help reduce odor. Limit gas-producing foods and carbonated beverages. You will experience less gas and fewer noises as you heal from surgery.  How much time will it take to care for my ostomy? At first, you may  spend a lot of time learning about your ostomy and how to take care of it. As you become more comfortable and skilled at changing the pouching system, it will take very little time to care for it.   Will I be able to return to work? People with ostomies can perform most jobs. As soon as you have healed from surgery, you should be able to return to work. Heavy lifting (more than 10 pounds) may be discouraged.   What about intimacy? Sexual relationships and intimacy are important and fulfilling aspects of your life. They should continue after ostomy surgery. Intimacy-related concerns should be discussed openly between you and your partner.   Can I wear regular clothing? You do not need to wear special clothing. Ostomy pouches are fairly flat and barely noticeable. Elastic undergarments will not hurt the stoma or prevent the ostomy from functioning.   Can I participate in sports? An ostomy should not limit your involvement in sports. Many people with ostomies are runners, skiers, swimmers or participate in other active lifestyles. Talk with your caregiver first before doing heavy physical activity.  Will you starve?  Not if you follow doctor's orders at each stage of your post-op adjustment. There is no such thing as an "ostomy diet". Some people with an ostomy will be able to eat and tolerate anything; others may find diffi-culty with some foods. Each person is an individual and must determine, by trial, what is best for them. A good practice for all is to drink plenty of water.   Will you be a social outcast?  Have you met anyone who has an ostomy and is a social outcast? Why should you be the first? Only your attitude and self image will effect how you are treated. No confi-dent person is an outcast.    PROFESSIONAL HELP   Resources are available if you need help or have questions about your ostomy.   Specially trained nurses called Wound, Ostomy Continence Nurses (WOCN) are available for  consultation in most major medical centers.  Consider getting an ostomy consult at an outpatient ostomy clinic.   [place] has an Ostomy Clinic run by an WOCN ostomy nurse at the [facility] campus.  [phone]. [facility] can help set up an appointment   The United Ostomy Association (UOA) is a group made up of many local chapters throughout the [place]. These local groups hold meetings and provide support to prospective and existing ostomates. They sponsor educational events and have qualified visitors to make personal or telephone visits. Contact the UOA for the chapter nearest you and for other educational publications.  More detailed information can be found in Colostomy Guide, a publication of the United Ostomy Association (UOA). Contact UOA at 1-800-826-0826 or visit their web site at www.uoaa.org. The website contains links to other sites, suppliers and resources.  [facility] Secure Start Services: Start at the website to enlist for support.  Your Wound Ostomy (WOCN) nurse may have started this   process. [email or web address]/securestart Secure Start services are designed to support people as they live their lives with an ostomy or neurogenic bladder. Enrolling is easy and at no cost to the patient. We realize that each person's needs and life journey are different. Through Secure Start services, we want to help people live their life, their way.  #######################################################  

## 2021-05-30 NOTE — Discharge Summary (Signed)
Physician Discharge Summary  ?Patient ID: ?[name] [name] ?MRN: [id number] ?DOB/AGE: November 03, 1940 81 y.o. ? ?Admit date: 05/20/2021 ?Discharge date: 05/30/2021 ? ?Admission Diagnoses: ?Colon cancer ?Discharge Diagnoses:  ?Principal Problem: ?  Colon cancer ([place]) ?Active Problems: ?  Pre-op testing ?Acute blood loss anemia ?Elevated Cr due to hypoperfusion ?Hemorrhagic shock ?A fib ?Hypertension ?Iron deficient anemia ?Type 2 diabetes ?Thrombocytopenia ?Consumptive coagulopathy  ? ?Discharged Condition: good ? ?Hospital Course: Patient underwent robotic assisted Extended R colectomy.  He developed post op bleeding and hemorrhagic shock in the PACU.  This responded to blood transfusion and pressors.  He was admitted to the ICU and CCM was consulted.  He developed an acute kidney injury with a rise in Cr to 2.95, but this responded well to IV fluids, once BP improved.  Once he was stabilized, he was taken back to the OR for diagnostic laparoscopy and washout of hematoma.  No active bleeding was seen.  Patient was transferred to the med surg floor after his hgb stabilized in the ICU.  Diet was advanced as tolerated.  Patient had an expected post op ileus.  Patient began to have bowel function on postop day 5.  By postop day 8, he was tolerating a solid diet and pain was controlled with oral medications.  He was urinating without difficulty and ambulating without assistance.  His Hgb was stable on full anticoagulation.  Patient was felt to be in stable condition for discharge to home.  Home health PT was arranged ? ? ?Consults:  CCM ? ?Significant Diagnostic Studies: labs: cbc, coags, bmet, ABG, blood glucose monitoring, radiology: CT scan: abdominal hematoma , transfusion of RBCs, platelets and FFP, cardiac monitoring and cardiac graphics: ECG: normal and Echocardiogram: normal ? ?Treatments: IV hydration, analgesia: acetaminophen and Tramadol, anticoagulation: LMW heparin, therapies: PT and OT, procedures: arterial line and  internal jugular line, and surgery: robotic partial colectomy, diagnostic laparoscopy and washout ? ?Discharge Exam: ?Blood pressure 127/68, pulse 79, temperature 98.7 ?F (37.1 ?C), temperature source Oral, resp. rate 16, height '5\' 11"'$  (1.803 m), weight 106.4 kg, SpO2 97 %. ?General appearance: alert and cooperative ?GI: soft, non-distended, non-tender ?Skin: incisions clean with resolving ecchymosis and erythema, no signs of infection ?Extremities: Thrombophlebitis of L forearm, no pedal edema ? ?Disposition: Discharge disposition: 01-Home or Self Care ? ? ? ? ? ? ? ?Allergies as of 05/30/2021   ? ?   Reactions  ? Amiodarone Rash  ? ?  ? ?  ?Medication List  ?  ? ?TAKE these medications   ? ?allopurinol 300 MG tablet ?Commonly known as: ZYLOPRIM ?Take 450 mg by mouth daily. ?  ?apixaban 5 MG Tabs tablet ?Commonly known as: ELIQUIS ?Take 1 tablet (5 mg total) by mouth 2 (two) times daily. ?  ?atorvastatin 80 MG tablet ?Commonly known as: LIPITOR ?TAKE 1 TABLET BY MOUTH ONCE DAILY AT  6  PM ?  ?benazepril 40 MG tablet ?Commonly known as: LOTENSIN ?Take 1 tablet by mouth once daily ?  ?diltiazem 180 MG 24 hr capsule ?Commonly known as: CARDIZEM CD ?Take 1 capsule (180 mg total) by mouth daily. ?  ?dorzolamide 2 % ophthalmic solution ?Commonly known as: TRUSOPT ?Place 1 drop into both eyes 2 (two) times daily. ?  ?Ferrex 150 150 MG capsule ?Generic drug: iron polysaccharides ?Take 150 mg by mouth 2 (two) times daily. ?  ?hydrALAZINE 100 MG tablet ?Commonly known as: APRESOLINE ?Take 100 mg by mouth 2 (two) times daily. ?  ?hydrochlorothiazide 12.5 MG tablet ?  Commonly known as: HYDRODIURIL ?Take 12.5 mg by mouth daily. ?  ?latanoprost 0.005 % ophthalmic solution ?Commonly known as: XALATAN ?Place 1 drop into both eyes at bedtime. ?  ?metFORMIN 500 MG 24 hr tablet ?Commonly known as: GLUCOPHAGE-XR ?Take 500 mg by mouth 2 (two) times daily. ?  ?metoprolol succinate 50 MG 24 hr tablet ?Commonly known as: TOPROL-XL ?Take 50  mg by mouth daily. Take with or immediately following a meal. ?  ?multivitamin tablet ?Take 1 tablet by mouth in the morning. ?  ?pantoprazole 40 MG tablet ?Commonly known as: PROTONIX ?Take 1 tablet (40 mg total) by mouth 2 (two) times daily before a meal. ?  ?traMADol 50 MG tablet ?Commonly known as: ULTRAM ?Take 1-2 tablets (50-100 mg total) by mouth every 6 (six) hours as needed for moderate pain. ?  ?vitamin C 500 MG tablet ?Commonly known as: ASCORBIC ACID ?Take 500 mg by mouth 2 (two) times daily. ?  ? ?  ? ? Follow-up Information   ? ? Leighton Ruff, MD. Schedule an appointment as soon as possible for a visit in 2 week(s).   ?Specialties: General Surgery, Colon and Rectal Surgery ?Contact information: ?[address] ?[place] [address] ?[phone] ? ? ?  ?  ? ? Derek Jack, MD. Call.   ?Specialty: Hematology ?Why: make sure you have an apt in the next 2-4 wks ?Contact information: ?[address] ?[place] [address] ?[phone] ? ? ?  ?  ? ?  ?  ? ?  ? ? ?Signed: ?Rosario Adie ?05/30/2021, 9:58 AM ? ? ?

## 2021-06-04 DIAGNOSIS — I10 Essential (primary) hypertension: Secondary | ICD-10-CM | POA: Diagnosis not present

## 2021-06-04 DIAGNOSIS — E1122 Type 2 diabetes mellitus with diabetic chronic kidney disease: Secondary | ICD-10-CM | POA: Diagnosis not present

## 2021-06-04 DIAGNOSIS — D132 Benign neoplasm of duodenum: Secondary | ICD-10-CM | POA: Diagnosis not present

## 2021-06-04 DIAGNOSIS — C189 Malignant neoplasm of colon, unspecified: Secondary | ICD-10-CM | POA: Diagnosis not present

## 2021-06-04 DIAGNOSIS — D509 Iron deficiency anemia, unspecified: Secondary | ICD-10-CM | POA: Diagnosis not present

## 2021-06-04 DIAGNOSIS — D62 Acute posthemorrhagic anemia: Secondary | ICD-10-CM | POA: Diagnosis not present

## 2021-06-04 DIAGNOSIS — I480 Paroxysmal atrial fibrillation: Secondary | ICD-10-CM | POA: Diagnosis not present

## 2021-06-04 DIAGNOSIS — Z483 Aftercare following surgery for neoplasm: Secondary | ICD-10-CM | POA: Diagnosis not present

## 2021-06-04 DIAGNOSIS — I2510 Atherosclerotic heart disease of native coronary artery without angina pectoris: Secondary | ICD-10-CM | POA: Diagnosis not present

## 2021-06-04 DIAGNOSIS — N189 Chronic kidney disease, unspecified: Secondary | ICD-10-CM | POA: Diagnosis not present

## 2021-06-08 DIAGNOSIS — Z483 Aftercare following surgery for neoplasm: Secondary | ICD-10-CM | POA: Diagnosis not present

## 2021-06-08 DIAGNOSIS — I2510 Atherosclerotic heart disease of native coronary artery without angina pectoris: Secondary | ICD-10-CM | POA: Diagnosis not present

## 2021-06-08 DIAGNOSIS — D132 Benign neoplasm of duodenum: Secondary | ICD-10-CM | POA: Diagnosis not present

## 2021-06-08 DIAGNOSIS — D509 Iron deficiency anemia, unspecified: Secondary | ICD-10-CM | POA: Diagnosis not present

## 2021-06-08 DIAGNOSIS — C189 Malignant neoplasm of colon, unspecified: Secondary | ICD-10-CM | POA: Diagnosis not present

## 2021-06-08 DIAGNOSIS — I480 Paroxysmal atrial fibrillation: Secondary | ICD-10-CM | POA: Diagnosis not present

## 2021-06-08 DIAGNOSIS — I10 Essential (primary) hypertension: Secondary | ICD-10-CM | POA: Diagnosis not present

## 2021-06-08 DIAGNOSIS — D62 Acute posthemorrhagic anemia: Secondary | ICD-10-CM | POA: Diagnosis not present

## 2021-06-08 DIAGNOSIS — E1122 Type 2 diabetes mellitus with diabetic chronic kidney disease: Secondary | ICD-10-CM | POA: Diagnosis not present

## 2021-06-08 DIAGNOSIS — N189 Chronic kidney disease, unspecified: Secondary | ICD-10-CM | POA: Diagnosis not present

## 2021-06-09 LAB — SURGICAL PATHOLOGY

## 2021-06-10 DIAGNOSIS — I480 Paroxysmal atrial fibrillation: Secondary | ICD-10-CM | POA: Diagnosis not present

## 2021-06-10 DIAGNOSIS — C189 Malignant neoplasm of colon, unspecified: Secondary | ICD-10-CM | POA: Diagnosis not present

## 2021-06-10 DIAGNOSIS — D509 Iron deficiency anemia, unspecified: Secondary | ICD-10-CM | POA: Diagnosis not present

## 2021-06-10 DIAGNOSIS — E1122 Type 2 diabetes mellitus with diabetic chronic kidney disease: Secondary | ICD-10-CM | POA: Diagnosis not present

## 2021-06-10 DIAGNOSIS — Z483 Aftercare following surgery for neoplasm: Secondary | ICD-10-CM | POA: Diagnosis not present

## 2021-06-10 DIAGNOSIS — D62 Acute posthemorrhagic anemia: Secondary | ICD-10-CM | POA: Diagnosis not present

## 2021-06-10 DIAGNOSIS — D132 Benign neoplasm of duodenum: Secondary | ICD-10-CM | POA: Diagnosis not present

## 2021-06-10 DIAGNOSIS — I2510 Atherosclerotic heart disease of native coronary artery without angina pectoris: Secondary | ICD-10-CM | POA: Diagnosis not present

## 2021-06-10 DIAGNOSIS — N189 Chronic kidney disease, unspecified: Secondary | ICD-10-CM | POA: Diagnosis not present

## 2021-06-10 DIAGNOSIS — I10 Essential (primary) hypertension: Secondary | ICD-10-CM | POA: Diagnosis not present

## 2021-06-12 ENCOUNTER — Other Ambulatory Visit: Payer: Self-pay | Admitting: Cardiology

## 2021-06-13 DIAGNOSIS — Z483 Aftercare following surgery for neoplasm: Secondary | ICD-10-CM | POA: Diagnosis not present

## 2021-06-13 DIAGNOSIS — I2510 Atherosclerotic heart disease of native coronary artery without angina pectoris: Secondary | ICD-10-CM | POA: Diagnosis not present

## 2021-06-13 DIAGNOSIS — D132 Benign neoplasm of duodenum: Secondary | ICD-10-CM | POA: Diagnosis not present

## 2021-06-13 DIAGNOSIS — N189 Chronic kidney disease, unspecified: Secondary | ICD-10-CM | POA: Diagnosis not present

## 2021-06-13 DIAGNOSIS — C189 Malignant neoplasm of colon, unspecified: Secondary | ICD-10-CM | POA: Diagnosis not present

## 2021-06-13 DIAGNOSIS — I10 Essential (primary) hypertension: Secondary | ICD-10-CM | POA: Diagnosis not present

## 2021-06-13 DIAGNOSIS — I480 Paroxysmal atrial fibrillation: Secondary | ICD-10-CM | POA: Diagnosis not present

## 2021-06-13 DIAGNOSIS — D62 Acute posthemorrhagic anemia: Secondary | ICD-10-CM | POA: Diagnosis not present

## 2021-06-13 DIAGNOSIS — D509 Iron deficiency anemia, unspecified: Secondary | ICD-10-CM | POA: Diagnosis not present

## 2021-06-13 DIAGNOSIS — E1122 Type 2 diabetes mellitus with diabetic chronic kidney disease: Secondary | ICD-10-CM | POA: Diagnosis not present

## 2021-06-16 DIAGNOSIS — D509 Iron deficiency anemia, unspecified: Secondary | ICD-10-CM | POA: Diagnosis not present

## 2021-06-16 DIAGNOSIS — N189 Chronic kidney disease, unspecified: Secondary | ICD-10-CM | POA: Diagnosis not present

## 2021-06-16 DIAGNOSIS — Z483 Aftercare following surgery for neoplasm: Secondary | ICD-10-CM | POA: Diagnosis not present

## 2021-06-16 DIAGNOSIS — E1122 Type 2 diabetes mellitus with diabetic chronic kidney disease: Secondary | ICD-10-CM | POA: Diagnosis not present

## 2021-06-16 DIAGNOSIS — D62 Acute posthemorrhagic anemia: Secondary | ICD-10-CM | POA: Diagnosis not present

## 2021-06-16 DIAGNOSIS — I2510 Atherosclerotic heart disease of native coronary artery without angina pectoris: Secondary | ICD-10-CM | POA: Diagnosis not present

## 2021-06-16 DIAGNOSIS — I10 Essential (primary) hypertension: Secondary | ICD-10-CM | POA: Diagnosis not present

## 2021-06-16 DIAGNOSIS — C189 Malignant neoplasm of colon, unspecified: Secondary | ICD-10-CM | POA: Diagnosis not present

## 2021-06-16 DIAGNOSIS — I480 Paroxysmal atrial fibrillation: Secondary | ICD-10-CM | POA: Diagnosis not present

## 2021-06-16 DIAGNOSIS — D132 Benign neoplasm of duodenum: Secondary | ICD-10-CM | POA: Diagnosis not present

## 2021-06-20 ENCOUNTER — Inpatient Hospital Stay (HOSPITAL_COMMUNITY): Payer: Medicare HMO | Attending: Hematology

## 2021-06-20 DIAGNOSIS — Z87891 Personal history of nicotine dependence: Secondary | ICD-10-CM | POA: Diagnosis not present

## 2021-06-20 DIAGNOSIS — D649 Anemia, unspecified: Secondary | ICD-10-CM | POA: Insufficient documentation

## 2021-06-20 DIAGNOSIS — Z79899 Other long term (current) drug therapy: Secondary | ICD-10-CM | POA: Insufficient documentation

## 2021-06-20 DIAGNOSIS — C189 Malignant neoplasm of colon, unspecified: Secondary | ICD-10-CM | POA: Diagnosis not present

## 2021-06-20 DIAGNOSIS — Z8546 Personal history of malignant neoplasm of prostate: Secondary | ICD-10-CM | POA: Insufficient documentation

## 2021-06-20 DIAGNOSIS — D509 Iron deficiency anemia, unspecified: Secondary | ICD-10-CM

## 2021-06-20 DIAGNOSIS — C184 Malignant neoplasm of transverse colon: Secondary | ICD-10-CM

## 2021-06-20 LAB — CBC WITH DIFFERENTIAL/PLATELET
Abs Immature Granulocytes: 0.05 10*3/uL (ref 0.00–0.07)
Basophils Absolute: 0.1 10*3/uL (ref 0.0–0.1)
Basophils Relative: 1 %
Eosinophils Absolute: 0.1 10*3/uL (ref 0.0–0.5)
Eosinophils Relative: 1 %
HCT: 37.7 % — ABNORMAL LOW (ref 39.0–52.0)
Hemoglobin: 11.8 g/dL — ABNORMAL LOW (ref 13.0–17.0)
Immature Granulocytes: 1 %
Lymphocytes Relative: 17 %
Lymphs Abs: 1.3 10*3/uL (ref 0.7–4.0)
MCH: 30.4 pg (ref 26.0–34.0)
MCHC: 31.3 g/dL (ref 30.0–36.0)
MCV: 97.2 fL (ref 80.0–100.0)
Monocytes Absolute: 0.8 10*3/uL (ref 0.1–1.0)
Monocytes Relative: 10 %
Neutro Abs: 5.3 10*3/uL (ref 1.7–7.7)
Neutrophils Relative %: 70 %
Platelets: 202 10*3/uL (ref 150–400)
RBC: 3.88 MIL/uL — ABNORMAL LOW (ref 4.22–5.81)
RDW: 17.2 % — ABNORMAL HIGH (ref 11.5–15.5)
WBC: 7.5 10*3/uL (ref 4.0–10.5)
nRBC: 0 % (ref 0.0–0.2)

## 2021-06-20 LAB — COMPREHENSIVE METABOLIC PANEL
ALT: 21 U/L (ref 0–44)
AST: 21 U/L (ref 15–41)
Albumin: 3.8 g/dL (ref 3.5–5.0)
Alkaline Phosphatase: 82 U/L (ref 38–126)
Anion gap: 5 (ref 5–15)
BUN: 18 mg/dL (ref 8–23)
CO2: 27 mmol/L (ref 22–32)
Calcium: 9.5 mg/dL (ref 8.9–10.3)
Chloride: 106 mmol/L (ref 98–111)
Creatinine, Ser: 0.82 mg/dL (ref 0.61–1.24)
GFR, Estimated: >60 mL/min (ref >60)
Glucose, Bld: 130 mg/dL — ABNORMAL HIGH (ref 70–99)
Potassium: 4 mmol/L (ref 3.5–5.1)
Sodium: 138 mmol/L (ref 135–145)
Total Bilirubin: 0.5 mg/dL (ref 0.3–1.2)
Total Protein: 7 g/dL (ref 6.5–8.1)

## 2021-06-20 LAB — IRON AND TIBC
Iron: 65 ug/dL (ref 45–182)
Saturation Ratios: 21 % (ref 17.9–39.5)
TIBC: 305 ug/dL (ref 250–450)
UIBC: 240 ug/dL

## 2021-06-20 LAB — FERRITIN: Ferritin: 78 ng/mL (ref 24–336)

## 2021-06-21 LAB — CEA: CEA: 5.4 ng/mL — ABNORMAL HIGH (ref 0.0–4.7)

## 2021-06-22 DIAGNOSIS — I10 Essential (primary) hypertension: Secondary | ICD-10-CM | POA: Diagnosis not present

## 2021-06-22 DIAGNOSIS — N189 Chronic kidney disease, unspecified: Secondary | ICD-10-CM | POA: Diagnosis not present

## 2021-06-22 DIAGNOSIS — C189 Malignant neoplasm of colon, unspecified: Secondary | ICD-10-CM | POA: Diagnosis not present

## 2021-06-22 DIAGNOSIS — I2510 Atherosclerotic heart disease of native coronary artery without angina pectoris: Secondary | ICD-10-CM | POA: Diagnosis not present

## 2021-06-22 DIAGNOSIS — Z483 Aftercare following surgery for neoplasm: Secondary | ICD-10-CM | POA: Diagnosis not present

## 2021-06-22 DIAGNOSIS — D509 Iron deficiency anemia, unspecified: Secondary | ICD-10-CM | POA: Diagnosis not present

## 2021-06-22 DIAGNOSIS — E1122 Type 2 diabetes mellitus with diabetic chronic kidney disease: Secondary | ICD-10-CM | POA: Diagnosis not present

## 2021-06-22 DIAGNOSIS — D132 Benign neoplasm of duodenum: Secondary | ICD-10-CM | POA: Diagnosis not present

## 2021-06-22 DIAGNOSIS — I480 Paroxysmal atrial fibrillation: Secondary | ICD-10-CM | POA: Diagnosis not present

## 2021-06-22 DIAGNOSIS — D62 Acute posthemorrhagic anemia: Secondary | ICD-10-CM | POA: Diagnosis not present

## 2021-06-23 ENCOUNTER — Other Ambulatory Visit (HOSPITAL_COMMUNITY): Payer: Self-pay

## 2021-06-23 ENCOUNTER — Inpatient Hospital Stay (HOSPITAL_COMMUNITY): Payer: Medicare HMO | Admitting: Hematology

## 2021-06-23 VITALS — BP 153/66 | HR 62 | Temp 97.5°F | Resp 18 | Ht 71.0 in | Wt 203.3 lb

## 2021-06-23 DIAGNOSIS — D649 Anemia, unspecified: Secondary | ICD-10-CM | POA: Diagnosis not present

## 2021-06-23 DIAGNOSIS — C184 Malignant neoplasm of transverse colon: Secondary | ICD-10-CM | POA: Diagnosis not present

## 2021-06-23 DIAGNOSIS — Z79899 Other long term (current) drug therapy: Secondary | ICD-10-CM | POA: Diagnosis not present

## 2021-06-23 DIAGNOSIS — Z87891 Personal history of nicotine dependence: Secondary | ICD-10-CM | POA: Diagnosis not present

## 2021-06-23 DIAGNOSIS — D509 Iron deficiency anemia, unspecified: Secondary | ICD-10-CM | POA: Diagnosis not present

## 2021-06-23 DIAGNOSIS — Z8546 Personal history of malignant neoplasm of prostate: Secondary | ICD-10-CM | POA: Diagnosis not present

## 2021-06-23 DIAGNOSIS — C189 Malignant neoplasm of colon, unspecified: Secondary | ICD-10-CM | POA: Diagnosis not present

## 2021-06-23 MED ORDER — CAPECITABINE 500 MG PO TABS
ORAL_TABLET | ORAL | 0 refills | Status: DC
  Filled 2021-06-23: qty 84, fill #0

## 2021-06-23 MED ORDER — PROCHLORPERAZINE MALEATE 10 MG PO TABS: 10.0000 mg | ORAL_TABLET | Freq: Four times a day (QID) | ORAL | 3 refills | Status: DC | PRN

## 2021-06-23 NOTE — Progress Notes (Signed)
? ?[facility] ?[address] ?[place], [place] [address] ? ? ?CLINIC:  ?Medical Oncology/Hematology ? ?PCP:  ?Caryl Bis, MD ?[address] [place] [address] ?[phone] ? ? ?REASON FOR VISIT:  ?Follow-up for colon cancer ? ?PRIOR THERAPY: none ? ?NGS Results: not done ? ?CURRENT THERAPY: surveillance ? ?BRIEF ONCOLOGIC HISTORY:  ?Oncology History  ? No history exists.  ? ? ?CANCER STAGING: ? Cancer Staging  ?Colon cancer ([place]) ?Staging form: Colon and Rectum, AJCC 8th Edition ?- Clinical stage from 03/22/2021: Stage I (cT1, cN0, cM0) - Unsigned ?- Pathologic stage from 06/23/2021: Stage IIIA (pT1, pN1b, cM0) - Unsigned ? ? ?INTERVAL HISTORY:  ?[name], a 81 y.o. male, returns for routine follow-up of his colon cancer. Larell was last seen on 03/22/2021.  ? ?Today he reports feeling good. He continues to take an iron tablet BID. He reports a burning sensation in his groin and dysuria after drinking less water. He denies tingling/numbness.  ? ?REVIEW OF SYSTEMS:  ?Review of Systems  ?Constitutional:  Negative for appetite change and fatigue.  ?Genitourinary:  Positive for dysuria and pelvic pain (groin).   ?Neurological:  Negative for numbness.  ?All other systems reviewed and are negative. ? ?PAST MEDICAL/SURGICAL HISTORY:  ?Past Medical History:  ?Diagnosis Date  ? Anemia   ? Arthritis   ? CAD (coronary artery disease)   ? DES to circumflex 12/2017  ? Diabetes mellitus without complication ([place])   ? GERD (gastroesophageal reflux disease)   ? Glaucoma   ? Gout   ? Headache   ? History of kidney stones   ? Hypertension   ? NSTEMI (non-ST elevated myocardial infarction) ([place]) 12/31/2017  ? Paroxysmal atrial fibrillation ([facility])   ? Pericardial effusion   ? Idiopathic, recurrent pericardial effusion s/p pericardial window.  ? Prostate cancer [facility]) 2007  ? S/P Seed implants   ? Supraventricular tachycardia ([place])   ? Temporal arteritis ([place])   ? ?Past Surgical History:  ?Procedure Laterality Date   ? BIOPSY  10/12/2020  ? Procedure: BIOPSY;  Surgeon: [name], MD;  [facility];  Service: Gastroenterology;;  ? BIOPSY  02/25/2021  ? Procedure: BIOPSY;  Surgeon: [name], MD;  [facility];  Service: Gastroenterology;;  ? BIOPSY  04/11/2021  ? Procedure: BIOPSY;  Surgeon: Irving Copas., MD;  Location: [facility];  Service: Gastroenterology;;  ? CATARACT EXTRACTION W/PHACO Left 06/07/2015  ? Procedure: CATARACT EXTRACTION PHACO AND INTRAOCULAR LENS PLACEMENT LEFT EYE CDE=8.00;  Surgeon: Tonny Branch, MD;  Location: [facility];  Service: Ophthalmology;  Laterality: Left;  ? CATARACT EXTRACTION W/PHACO Right 06/17/2015  ? Procedure: CATARACT EXTRACTION PHACO AND INTRAOCULAR LENS PLACEMENT RIGHT EYE CDE=11.09;  Surgeon: Tonny Branch, MD;  [facility]: [facility];  Service: Ophthalmology;  Laterality: Right;  ? COLONOSCOPY WITH PROPOFOL N/A 10/12/2020  ? Procedure: COLONOSCOPY WITH PROPOFOL;  Surgeon: [name], MD;  [facility];  Service: Gastroenterology;  Laterality: N/A;  10:35  ? COLONOSCOPY WITH PROPOFOL N/A 02/25/2021  ? Procedure: COLONOSCOPY WITH PROPOFOL;  Surgeon: [name], MD;  [facility];  Service: Gastroenterology;  Laterality: N/A;  8:10  ? CORONARY ANGIOPLASTY WITH STENT PLACEMENT  01/01/2018  ? CORONARY STENT INTERVENTION N/A 01/01/2018  ? Procedure: CORONARY STENT INTERVENTION;  Surgeon: Jettie Booze, MD;  Location: [facility];  Service: Cardiovascular;  Laterality: N/A;  ? ENDOSCOPIC MUCOSAL RESECTION N/A 04/11/2021  ?  Procedure: ENDOSCOPIC MUCOSAL RESECTION;  Surgeon: Rush Landmark Telford Nab., MD;  Location: [facility];  Service: Gastroenterology;  Laterality: N/A;  ? ENTEROSCOPY N/A 10/12/2020  ? Procedure: PUSH ENTEROSCOPY;  Surgeon: [name], MD;  [facility];  Service: Gastroenterology;  Laterality: N/A;  ? ENTEROSCOPY N/A 04/11/2021  ?  Procedure: ENTEROSCOPY;  Surgeon: Rush Landmark Telford Nab., MD;  Location: [facility];  Service: Gastroenterology;  Laterality: N/A;  ? ESOPHAGOGASTRODUODENOSCOPY (EGD) WITH PROPOFOL N/A 10/12/2020  ? Procedure: ESOPHAGOGASTRODUODENOSCOPY (EGD) WITH PROPOFOL;  Surgeon: [name], MD;  [facility];  Service: Gastroenterology;  Laterality: N/A;  ? FRACTURE SURGERY Left 2010  ? ankle  ? HEMOSTASIS CLIP PLACEMENT  04/11/2021  ? Procedure: HEMOSTASIS CLIP PLACEMENT;  Surgeon: Irving Copas., MD;  Location: [facility];  Service: Gastroenterology;;  ? [place]  2007  ? KNEE ARTHROSCOPY Left   ? LAPAROSCOPY N/A 05/22/2021  ? Procedure: LAPAROSCOPY DIAGNOSTIC WITH WASHOUT OF ABDOMEN;  Surgeon: Leighton Ruff, MD;  Location: [facility];  Service: General;  Laterality: N/A;  ? LEFT HEART CATH AND CORONARY ANGIOGRAPHY N/A 01/01/2018  ? Procedure: LEFT HEART CATH AND CORONARY ANGIOGRAPHY;  Surgeon: Jettie Booze, MD;  Location: [facility];  Service: Cardiovascular;  Laterality: N/A;  ? ORIF TIBIA & FIBULA FRACTURES Left 2003  ? Distal tibial/fibula   ? PERICARDIAL WINDOW  02/2007  ? PERICARDIOCENTESIS  2007  ? hx/notes 10/19/2011  ? POLYPECTOMY  10/12/2020  ? Procedure: POLYPECTOMY;  Surgeon: [name], MD;  [facility];  Service: Gastroenterology;;  small bowel, cecal  ? POLYPECTOMY  02/25/2021  ? Procedure: POLYPECTOMY;  Surgeon: [name], MD;  [facility];  Service: Gastroenterology;;  transverse colon x2  ? SUBMUCOSAL LIFTING INJECTION  04/11/2021  ? Procedure: SUBMUCOSAL LIFTING INJECTION;  Surgeon: Irving Copas., MD;  Location: [facility];  Service: Gastroenterology;;  ? SUBMUCOSAL TATTOO INJECTION  04/11/2021  ? Procedure: SUBMUCOSAL TATTOO INJECTION;  Surgeon: Irving Copas., MD;  Location: [facility];  Service: Gastroenterology;;  ? ? ?SOCIAL HISTORY:  ?Social History   ? ?Socioeconomic History  ? Marital status: Married  ?  Spouse name: Not on file  ? Number of children: Not on file  ? Years of education: Not on file  ? Highest education level: Not on file  ?Occupational History  ? Not on file  ?Tobacco Use  ? Smoking status: Former  ?  Packs/day: 0.30  ?  Years: 10.00  ?  Pack years: 3.00  ?  Types: Cigarettes  ?  Start date: 08/06/1958  ?  Quit date: 1968  ?  Years since quitting: 55.2  ? Smokeless tobacco: Never  ?Vaping Use  ? Vaping Use: Never used  ?Substance and Sexual Activity  ? Alcohol use: Not Currently  ? Drug use: Never  ? Sexual activity: Yes  ?Other Topics Concern  ? Not on file  ?Social History Narrative  ? Not on file  ? ?Social Determinants of Health  ? ?Financial Resource Strain: Not on file  ?Food Insecurity: Not on file  ?Transportation Needs: Not on file  ?Physical Activity: Not on file  ?Stress: Not on file  ?Social Connections: Not on file  ?Intimate Partner Violence: Not on file  ? ? ?FAMILY HISTORY:  ?Family History  ?Problem Relation Age of Onset  ? CAD Mother   ?     MI in 12s  ? Prostate cancer Father   ?  Prostate cancer Brother   ? Lung cancer Brother   ? ? ?CURRENT MEDICATIONS:  ?Current Outpatient Medications  ?Medication Sig Dispense Refill  ? allopurinol (ZYLOPRIM) 300 MG tablet Take 450 mg by mouth daily.     ? apixaban (ELIQUIS) 5 MG TABS tablet Take 1 tablet (5 mg total) by mouth 2 (two) times daily. 60 tablet 6  ? atorvastatin (LIPITOR) 80 MG tablet TAKE 1 TABLET BY MOUTH ONCE DAILY AT  6  PM 90 tablet 0  ? benazepril (LOTENSIN) 40 MG tablet Take 1 tablet by mouth once daily 90 tablet 0  ? diltiazem (CARDIZEM CD) 180 MG 24 hr capsule Take 1 capsule (180 mg total) by mouth daily. 30 capsule 1  ? dorzolamide (TRUSOPT) 2 % ophthalmic solution Place 1 drop into both eyes 2 (two) times daily.    ? FERREX 150 150 MG capsule Take 150 mg by mouth 2 (two) times daily.    ? hydrALAZINE (APRESOLINE) 100 MG tablet Take 100 mg by mouth 2 (two) times daily.     ? hydrochlorothiazide (HYDRODIURIL) 12.5 MG tablet Take 12.5 mg by mouth daily.    ? latanoprost (XALATAN) 0.005 % ophthalmic solution Place 1 drop into both eyes at bedtime.    ? metFORMIN (GLUCOPHAGE-XR) 500 M

## 2021-06-23 NOTE — Patient Instructions (Signed)
[place] at [facility] ?Discharge Instructions ? ? ?You were seen and examined today by Dr. Delton Coombes. ? ?He discussed with you that Dr. Marcello Moores removed 29 lymph nodes, of which 2 were positive. This makes this a Stage III colon cancer.  Dr. Raliegh Ip discussed a treatment option with you with a chemotherapy pill called Xeloda. This pill you would take 2 weeks on and 1 week off. That is considered 1 cycle.  We would do this treatment for 8 cycles. We will provide you information on this drug.   ? ?Return as scheduled.  ? ? ? ?Thank you for choosing [place] [facility] to provide your oncology and hematology care.  To afford each patient quality time with our provider, please arrive at least 15 minutes before your scheduled appointment time.  ? ?If you have a lab appointment with the [facility] please come in thru the Main Entrance and check in at the main information desk. ? ?You need to re-schedule your appointment should you arrive 10 or more minutes late.  We strive to give you quality time with our providers, and arriving late affects you and other patients whose appointments are after yours.  Also, if you no show three or more times for appointments you may be dismissed from the clinic at the providers discretion.     ?Again, thank you for choosing [facility].  Our hope is that these requests will decrease the amount of time that you wait before being seen by our physicians.       ?_____________________________________________________________ ? ?Should you have questions after your visit to [facility], please contact our office at [phone] and follow the prompts.  Our office hours are 8:00 a.m. and 4:30 p.m. Monday - Friday.  Please note that voicemails left after 4:00 p.m. may not be returned until the following business day.  We are closed weekends and major holidays.  You do have access to a nurse 24-7, just call the main  number to the clinic [phone] and do not press any options, hold on the line and a nurse will answer the phone.   ? ?For prescription refill requests, have your pharmacy contact our office and allow 72 hours.   ? ?Due to Covid, you will need to wear a mask upon entering the hospital. If you do not have a mask, a mask will be given to you at the Main Entrance upon arrival. For doctor visits, patients may have 1 support person age 59 or older with them. For treatment visits, patients can not have anyone with them due to social distancing guidelines and our immunocompromised population.  ? ?   ?

## 2021-06-24 ENCOUNTER — Telehealth (HOSPITAL_COMMUNITY): Payer: Self-pay | Admitting: Pharmacy Technician

## 2021-06-24 DIAGNOSIS — D62 Acute posthemorrhagic anemia: Secondary | ICD-10-CM | POA: Diagnosis not present

## 2021-06-24 DIAGNOSIS — E1122 Type 2 diabetes mellitus with diabetic chronic kidney disease: Secondary | ICD-10-CM | POA: Diagnosis not present

## 2021-06-24 DIAGNOSIS — I10 Essential (primary) hypertension: Secondary | ICD-10-CM | POA: Diagnosis not present

## 2021-06-24 DIAGNOSIS — I480 Paroxysmal atrial fibrillation: Secondary | ICD-10-CM | POA: Diagnosis not present

## 2021-06-24 DIAGNOSIS — N189 Chronic kidney disease, unspecified: Secondary | ICD-10-CM | POA: Diagnosis not present

## 2021-06-24 DIAGNOSIS — I2510 Atherosclerotic heart disease of native coronary artery without angina pectoris: Secondary | ICD-10-CM | POA: Diagnosis not present

## 2021-06-24 DIAGNOSIS — D132 Benign neoplasm of duodenum: Secondary | ICD-10-CM | POA: Diagnosis not present

## 2021-06-24 DIAGNOSIS — C189 Malignant neoplasm of colon, unspecified: Secondary | ICD-10-CM | POA: Diagnosis not present

## 2021-06-24 DIAGNOSIS — Z483 Aftercare following surgery for neoplasm: Secondary | ICD-10-CM | POA: Diagnosis not present

## 2021-06-24 DIAGNOSIS — D509 Iron deficiency anemia, unspecified: Secondary | ICD-10-CM | POA: Diagnosis not present

## 2021-06-27 ENCOUNTER — Other Ambulatory Visit (HOSPITAL_COMMUNITY): Payer: Self-pay

## 2021-06-27 ENCOUNTER — Other Ambulatory Visit: Payer: Self-pay | Admitting: Cardiology

## 2021-06-27 NOTE — Telephone Encounter (Signed)
Oral Oncology Patient Advocate Encounter ? ?After completing a benefits investigation, prior authorization for Capecitabine is not required at this time through Medicare B benefit. ? ?Patient's copay is $525.44.   ? ?Dennison Nancy CPHT ?Specialty Pharmacy Patient Advocate ?[place] ?Phone [phone] ?Fax [phone] ?06/27/2021 10:56 AM ?   ? ? ?  ? ?

## 2021-06-28 ENCOUNTER — Other Ambulatory Visit (HOSPITAL_COMMUNITY): Payer: Self-pay

## 2021-06-28 ENCOUNTER — Telehealth (HOSPITAL_COMMUNITY): Payer: Self-pay | Admitting: Pharmacist

## 2021-06-28 DIAGNOSIS — I2510 Atherosclerotic heart disease of native coronary artery without angina pectoris: Secondary | ICD-10-CM | POA: Diagnosis not present

## 2021-06-28 DIAGNOSIS — Z483 Aftercare following surgery for neoplasm: Secondary | ICD-10-CM | POA: Diagnosis not present

## 2021-06-28 DIAGNOSIS — C184 Malignant neoplasm of transverse colon: Secondary | ICD-10-CM

## 2021-06-28 DIAGNOSIS — D509 Iron deficiency anemia, unspecified: Secondary | ICD-10-CM | POA: Diagnosis not present

## 2021-06-28 DIAGNOSIS — D132 Benign neoplasm of duodenum: Secondary | ICD-10-CM | POA: Diagnosis not present

## 2021-06-28 DIAGNOSIS — I480 Paroxysmal atrial fibrillation: Secondary | ICD-10-CM | POA: Diagnosis not present

## 2021-06-28 DIAGNOSIS — N189 Chronic kidney disease, unspecified: Secondary | ICD-10-CM | POA: Diagnosis not present

## 2021-06-28 DIAGNOSIS — D62 Acute posthemorrhagic anemia: Secondary | ICD-10-CM | POA: Diagnosis not present

## 2021-06-28 DIAGNOSIS — I10 Essential (primary) hypertension: Secondary | ICD-10-CM | POA: Diagnosis not present

## 2021-06-28 DIAGNOSIS — C189 Malignant neoplasm of colon, unspecified: Secondary | ICD-10-CM | POA: Diagnosis not present

## 2021-06-28 DIAGNOSIS — E1122 Type 2 diabetes mellitus with diabetic chronic kidney disease: Secondary | ICD-10-CM | POA: Diagnosis not present

## 2021-06-28 NOTE — Telephone Encounter (Signed)
Oral Oncology Pharmacist Encounter ? ?Received new prescription for Xeloda (capecitabine) for the adjuvant treatment of stage III colon cancer, planned duration of 8 cycles. ? ?CMP from 06/20/21 assessed, no relevant lab abnormalities. Prescription dose and frequency assessed. MD plans on starting at low-dose of 1000 mg twice daily for the first week.  If he tolerates well, will increase to 1500 mg twice daily. ? ?Current medication list in Epic reviewed, a few DDIs with capecitabine identified: ?Allopurinol: Allopurinol may decrease serum concentrations of the capecitabine active metabolite, category X. The recommendation is to avoid the combination. Reached out to MD to discuss management of this DDI ?Pantoprazole: Proton Pump Inhibitors (PPI) may diminish the therapeutic effect of capecitabine, varying information on the clinical impact. Recommend evaluating the need for a PPI/acid suppression. If acid suppression is needed, attempt switching to a H2 antagonist (eg, famotidine) if possible. ? ?Evaluated chart and no patient barriers to medication adherence identified.  ? ?Prescription has been e-scribed to the [facility] for benefits analysis and approval. ? ?Oral Oncology Clinic will continue to follow for insurance authorization, copayment issues, initial counseling and start date. ? ? ?Darl Pikes, PharmD, BCPS, BCOP, CPP ?Hematology/Oncology Clinical Pharmacist Practitioner ?[place]/[facility] Oral Chemotherapy Navigation Clinic ?[phone] ? ?06/28/2021 4:20 PM ? ?

## 2021-06-29 ENCOUNTER — Telehealth (HOSPITAL_COMMUNITY): Payer: Self-pay | Admitting: *Deleted

## 2021-06-29 ENCOUNTER — Other Ambulatory Visit (HOSPITAL_COMMUNITY): Payer: Self-pay

## 2021-06-29 ENCOUNTER — Other Ambulatory Visit (HOSPITAL_COMMUNITY): Payer: Self-pay | Admitting: Hematology

## 2021-06-29 DIAGNOSIS — Z6827 Body mass index (BMI) 27.0-27.9, adult: Secondary | ICD-10-CM | POA: Diagnosis not present

## 2021-06-29 DIAGNOSIS — C189 Malignant neoplasm of colon, unspecified: Secondary | ICD-10-CM | POA: Diagnosis not present

## 2021-06-29 DIAGNOSIS — I482 Chronic atrial fibrillation, unspecified: Secondary | ICD-10-CM | POA: Diagnosis not present

## 2021-06-29 MED ORDER — FEBUXOSTAT 40 MG PO TABS: 40.0000 mg | ORAL_TABLET | Freq: Every day | ORAL | 6 refills | Status: DC

## 2021-06-29 MED ORDER — CAPECITABINE 500 MG PO TABS
ORAL_TABLET | ORAL | 0 refills | Status: DC
  Filled 2021-06-29: qty 70, fill #0
  Filled 2021-06-30: qty 70, 21d supply, fill #0

## 2021-06-29 NOTE — Telephone Encounter (Signed)
Patient's wife Barnett Applebaum was contacted and made aware that he needs to stop allopurinol and begin Uloric 40 mg starting tomorrow. Rationale explained and verbalized understanding. ?

## 2021-06-29 NOTE — Telephone Encounter (Signed)
-----   Message from Derek Jack, MD sent at 06/29/2021 12:24 PM EDT ----- ?Regarding: FW: DDI- allopurinol and capecitabine ?Andreina Outten, ?We are starting him on Xeloda for colon cancer.  However he is on allopurinol which interacts with Xeloda (please see the message below).  I have sent a prescription for Uloric 40 mg daily for gout to his pharmacy in [place].  Please call and let him know to stop allopurinol and start Uloric tomorrow.  Thank you. ?----- Message ----- ?From: Darl Pikes, RPH-CPP ?Sent: 06/29/2021   9:39 AM EDT ?To: Derek Jack, MD ?Subject: DDI- allopurinol and capecitabine             ? ?Dr. Delton Coombes, ? ?Good morning! There is a drug interaction between capecitabine and allopurinol, category X. Allopurinol may decrease serum concentrations of the active metabolite capecitabine. The recommendation is to avoid the combination. I reached out to the patient's wife to verify that he is taking the allopurinol daily for gout and has been for years.  ? ?Would you wanted to: 1) change him to an alternative, 2) reach out to his PCP about changing to an alternative, or 3) just stop the allopurinol and see how he does? ? ?-[place]  ? ? ?

## 2021-06-30 ENCOUNTER — Other Ambulatory Visit (HOSPITAL_COMMUNITY): Payer: Self-pay

## 2021-06-30 ENCOUNTER — Telehealth (HOSPITAL_COMMUNITY): Payer: Self-pay | Admitting: Hematology

## 2021-06-30 NOTE — Telephone Encounter (Signed)
Oral Chemotherapy Pharmacist Encounter ? ?Mr. Macmullen wil have his capecitabine delivered from [place] tomorrow 07/01/21. He will begin taking his medication on 07/06/21 to align with his one week clinic f/u appt.  ? ?Patient Education ?I spoke with patient and his wife Barnett Applebaum for overview of new oral chemotherapy medication: Xeloda (capecitabine) for the adjuvant treatment of stage III colon cancer, planned duration of 8 cycles.  ? ?Counseled patient on administration, dosing, side effects, monitoring, drug-food interactions, safe handling, storage, and disposal. ?Patient will take 2 tablets ('1000mg'$ ) by mouth twice daily for 7 days, then increase to 3 tablets ('1500mg'$ ) twice daily for 7 days, then hold for 7 days. Take with food. ? ?Side effects include but not limited to: diarrhea, hand-foot syndrome, mouth sores, edema, decreased wbc, fatigue, N/V ?Diarrhea: wife has picked up loperamide to use as needed. They know to call if he is having 4 or more loose stools per day ?Hand-foot syndrome: Recommended the use of [place]der[place] Smooth Extra Care 20 ?Mouth sores: they know to call for magic mouthwash if needed ? ?Drug-drug interactions: ?Patient has stopped his allopurinol and will begin taking febuxostat ?Patient will try stopping his pantoprazole, he knows that famotidine and calcium carbonate are acceptable alternatives for his reflux ? ?Reviewed with patient importance of keeping a medication schedule and plan for any missed doses. ? ?After discussion with patient no patient barriers to medication adherence identified.  ? ?The [facility] voiced understanding and appreciation. All questions answered. Medication handout provided. ? ?Provided patient with [facility] phone number. Patient knows to call the office with questions or concerns. Oral Chemotherapy Navigation Clinic will continue to follow. ? ?Darl Pikes, PharmD, BCPS, BCOP, CPP ?Hematology/Oncology Clinical Pharmacist  Practitioner ?[place]/[facility] Oral Chemotherapy Navigation Clinic ?[phone] ? ?06/30/2021 12:36 PM ? ?

## 2021-06-30 NOTE — Telephone Encounter (Signed)
Oral Oncology Patient Advocate Encounter ? ?I spoke with patients wife, Barnett Applebaum, today to set up delivery of Capecitabine.  Address verified for shipment.  Capecitabine will be filled through [facility] and mailed 06/30/21 for delivery 07/01/21. Patient plan to start 07/06/21.   ? ?[place] will call 7-10 days before next refill is due to complete adherence call and set up delivery of medication.    ? ?Dennison Nancy CPHT ?Specialty Pharmacy Patient Advocate ?[place] ?Phone [phone] ?Fax [phone] ?06/30/2021 2:34 PM ? ? ?

## 2021-06-30 NOTE — Telephone Encounter (Signed)
Pts wife questioned if Dr Raliegh Ip is in network with [employer]. I advised her that he is with most plans but not all. ? ?Pt also had questions regarding what to submit to her cancer policy. Advised her she will need to send the path report and possibly a note.  ?

## 2021-07-01 DIAGNOSIS — D62 Acute posthemorrhagic anemia: Secondary | ICD-10-CM | POA: Diagnosis not present

## 2021-07-01 DIAGNOSIS — I2510 Atherosclerotic heart disease of native coronary artery without angina pectoris: Secondary | ICD-10-CM | POA: Diagnosis not present

## 2021-07-01 DIAGNOSIS — I480 Paroxysmal atrial fibrillation: Secondary | ICD-10-CM | POA: Diagnosis not present

## 2021-07-01 DIAGNOSIS — E1122 Type 2 diabetes mellitus with diabetic chronic kidney disease: Secondary | ICD-10-CM | POA: Diagnosis not present

## 2021-07-01 DIAGNOSIS — N189 Chronic kidney disease, unspecified: Secondary | ICD-10-CM | POA: Diagnosis not present

## 2021-07-01 DIAGNOSIS — C189 Malignant neoplasm of colon, unspecified: Secondary | ICD-10-CM | POA: Diagnosis not present

## 2021-07-01 DIAGNOSIS — I10 Essential (primary) hypertension: Secondary | ICD-10-CM | POA: Diagnosis not present

## 2021-07-01 DIAGNOSIS — D132 Benign neoplasm of duodenum: Secondary | ICD-10-CM | POA: Diagnosis not present

## 2021-07-01 DIAGNOSIS — D509 Iron deficiency anemia, unspecified: Secondary | ICD-10-CM | POA: Diagnosis not present

## 2021-07-01 DIAGNOSIS — Z483 Aftercare following surgery for neoplasm: Secondary | ICD-10-CM | POA: Diagnosis not present

## 2021-07-05 DIAGNOSIS — C189 Malignant neoplasm of colon, unspecified: Secondary | ICD-10-CM | POA: Diagnosis not present

## 2021-07-05 DIAGNOSIS — I480 Paroxysmal atrial fibrillation: Secondary | ICD-10-CM | POA: Diagnosis not present

## 2021-07-05 DIAGNOSIS — E1122 Type 2 diabetes mellitus with diabetic chronic kidney disease: Secondary | ICD-10-CM | POA: Diagnosis not present

## 2021-07-05 DIAGNOSIS — D132 Benign neoplasm of duodenum: Secondary | ICD-10-CM | POA: Diagnosis not present

## 2021-07-05 DIAGNOSIS — I2510 Atherosclerotic heart disease of native coronary artery without angina pectoris: Secondary | ICD-10-CM | POA: Diagnosis not present

## 2021-07-05 DIAGNOSIS — I10 Essential (primary) hypertension: Secondary | ICD-10-CM | POA: Diagnosis not present

## 2021-07-05 DIAGNOSIS — Z483 Aftercare following surgery for neoplasm: Secondary | ICD-10-CM | POA: Diagnosis not present

## 2021-07-05 DIAGNOSIS — D509 Iron deficiency anemia, unspecified: Secondary | ICD-10-CM | POA: Diagnosis not present

## 2021-07-05 DIAGNOSIS — N189 Chronic kidney disease, unspecified: Secondary | ICD-10-CM | POA: Diagnosis not present

## 2021-07-05 DIAGNOSIS — D62 Acute posthemorrhagic anemia: Secondary | ICD-10-CM | POA: Diagnosis not present

## 2021-07-11 ENCOUNTER — Other Ambulatory Visit (HOSPITAL_COMMUNITY): Payer: Self-pay

## 2021-07-11 DIAGNOSIS — C189 Malignant neoplasm of colon, unspecified: Secondary | ICD-10-CM | POA: Diagnosis not present

## 2021-07-11 DIAGNOSIS — D509 Iron deficiency anemia, unspecified: Secondary | ICD-10-CM | POA: Diagnosis not present

## 2021-07-11 DIAGNOSIS — N189 Chronic kidney disease, unspecified: Secondary | ICD-10-CM | POA: Diagnosis not present

## 2021-07-11 DIAGNOSIS — I480 Paroxysmal atrial fibrillation: Secondary | ICD-10-CM | POA: Diagnosis not present

## 2021-07-11 DIAGNOSIS — E1122 Type 2 diabetes mellitus with diabetic chronic kidney disease: Secondary | ICD-10-CM | POA: Diagnosis not present

## 2021-07-11 DIAGNOSIS — Z483 Aftercare following surgery for neoplasm: Secondary | ICD-10-CM | POA: Diagnosis not present

## 2021-07-11 DIAGNOSIS — I10 Essential (primary) hypertension: Secondary | ICD-10-CM | POA: Diagnosis not present

## 2021-07-11 DIAGNOSIS — D62 Acute posthemorrhagic anemia: Secondary | ICD-10-CM | POA: Diagnosis not present

## 2021-07-11 DIAGNOSIS — I2510 Atherosclerotic heart disease of native coronary artery without angina pectoris: Secondary | ICD-10-CM | POA: Diagnosis not present

## 2021-07-11 DIAGNOSIS — D132 Benign neoplasm of duodenum: Secondary | ICD-10-CM | POA: Diagnosis not present

## 2021-07-11 DIAGNOSIS — C184 Malignant neoplasm of transverse colon: Secondary | ICD-10-CM

## 2021-07-12 ENCOUNTER — Inpatient Hospital Stay (HOSPITAL_COMMUNITY): Payer: Medicare HMO | Attending: Hematology | Admitting: Hematology

## 2021-07-12 ENCOUNTER — Inpatient Hospital Stay (HOSPITAL_COMMUNITY): Payer: Medicare HMO

## 2021-07-12 ENCOUNTER — Other Ambulatory Visit (HOSPITAL_COMMUNITY): Payer: Self-pay

## 2021-07-12 ENCOUNTER — Other Ambulatory Visit (HOSPITAL_COMMUNITY): Payer: Self-pay | Admitting: *Deleted

## 2021-07-12 VITALS — BP 136/56 | HR 61 | Temp 98.0°F | Resp 18 | Ht 71.0 in | Wt 205.8 lb

## 2021-07-12 DIAGNOSIS — Z8546 Personal history of malignant neoplasm of prostate: Secondary | ICD-10-CM | POA: Insufficient documentation

## 2021-07-12 DIAGNOSIS — C184 Malignant neoplasm of transverse colon: Secondary | ICD-10-CM

## 2021-07-12 DIAGNOSIS — Z87891 Personal history of nicotine dependence: Secondary | ICD-10-CM | POA: Diagnosis not present

## 2021-07-12 DIAGNOSIS — D509 Iron deficiency anemia, unspecified: Secondary | ICD-10-CM

## 2021-07-12 DIAGNOSIS — D649 Anemia, unspecified: Secondary | ICD-10-CM | POA: Insufficient documentation

## 2021-07-12 DIAGNOSIS — Z79899 Other long term (current) drug therapy: Secondary | ICD-10-CM | POA: Insufficient documentation

## 2021-07-12 DIAGNOSIS — C189 Malignant neoplasm of colon, unspecified: Secondary | ICD-10-CM | POA: Insufficient documentation

## 2021-07-12 LAB — CBC WITH DIFFERENTIAL/PLATELET
Abs Immature Granulocytes: 0.03 10*3/uL (ref 0.00–0.07)
Basophils Absolute: 0.1 10*3/uL (ref 0.0–0.1)
Basophils Relative: 1 %
Eosinophils Absolute: 0.1 10*3/uL (ref 0.0–0.5)
Eosinophils Relative: 1 %
HCT: 34.6 % — ABNORMAL LOW (ref 39.0–52.0)
Hemoglobin: 11.2 g/dL — ABNORMAL LOW (ref 13.0–17.0)
Immature Granulocytes: 1 %
Lymphocytes Relative: 18 %
Lymphs Abs: 1 10*3/uL (ref 0.7–4.0)
MCH: 30.7 pg (ref 26.0–34.0)
MCHC: 32.4 g/dL (ref 30.0–36.0)
MCV: 94.8 fL (ref 80.0–100.0)
Monocytes Absolute: 0.5 10*3/uL (ref 0.1–1.0)
Monocytes Relative: 8 %
Neutro Abs: 4.2 10*3/uL (ref 1.7–7.7)
Neutrophils Relative %: 71 %
Platelets: 175 10*3/uL (ref 150–400)
RBC: 3.65 MIL/uL — ABNORMAL LOW (ref 4.22–5.81)
RDW: 15.6 % — ABNORMAL HIGH (ref 11.5–15.5)
WBC: 5.8 10*3/uL (ref 4.0–10.5)
nRBC: 0 % (ref 0.0–0.2)

## 2021-07-12 LAB — COMPREHENSIVE METABOLIC PANEL
ALT: 27 U/L (ref 0–44)
AST: 32 U/L (ref 15–41)
Albumin: 3.7 g/dL (ref 3.5–5.0)
Alkaline Phosphatase: 71 U/L (ref 38–126)
Anion gap: 7 (ref 5–15)
BUN: 18 mg/dL (ref 8–23)
CO2: 24 mmol/L (ref 22–32)
Calcium: 9.4 mg/dL (ref 8.9–10.3)
Chloride: 106 mmol/L (ref 98–111)
Creatinine, Ser: 0.74 mg/dL (ref 0.61–1.24)
GFR, Estimated: >60 mL/min (ref >60)
Glucose, Bld: 111 mg/dL — ABNORMAL HIGH (ref 70–99)
Potassium: 3.6 mmol/L (ref 3.5–5.1)
Sodium: 137 mmol/L (ref 135–145)
Total Bilirubin: 0.5 mg/dL (ref 0.3–1.2)
Total Protein: 6.7 g/dL (ref 6.5–8.1)

## 2021-07-12 LAB — MAGNESIUM: Magnesium: 1.8 mg/dL (ref 1.7–2.4)

## 2021-07-12 NOTE — Progress Notes (Signed)
? ?[facility] ?[address] ?[place], [place] [address] ? ? ?CLINIC:  ?Medical Oncology/Hematology ? ?PCP:  ?[name] Bis, MD ?[address] [place]  [address] ?[phone] ? ? ?REASON FOR VISIT:  ?Follow-up for colon cancer ? ?PRIOR THERAPY: none ? ?NGS Results: not done ? ?CURRENT THERAPY: surveillance ? ?BRIEF ONCOLOGIC HISTORY:  ?Oncology History  ? No history exists.  ? ? ?CANCER STAGING: ? Cancer Staging  ?Colon cancer ([place]) ?Staging form: Colon and Rectum, AJCC 8th Edition ?- Clinical stage from 03/22/2021: Stage I (cT1, cN0, cM0) - Unsigned ?- Pathologic stage from 06/23/2021: Stage IIIA (pT1, pN1b, cM0) - Unsigned ? ? ?INTERVAL HISTORY:  ?[name], a 81 y.o. male, returns for routine follow-up of his colon cancer. [name] was last seen on 06/26/2021.  ? ?Today he reports feeling good. He has gained 2 lbs since 03/30. He has only required Compazine once, and he denies nausea. He had 2 episodes of diarrhea which was helped by Imodium. He is eating well. He denies mouth sores.  ? ?REVIEW OF SYSTEMS:  ?Review of Systems  ?Constitutional:  Negative for appetite change, fatigue and unexpected weight change.  ?HENT:   Negative for mouth sores.   ?Gastrointestinal:  Positive for diarrhea (x2). Negative for nausea.  ?All other systems reviewed and are negative. ? ?PAST MEDICAL/SURGICAL HISTORY:  ?Past Medical History:  ?Diagnosis Date  ? Anemia   ? Arthritis   ? CAD (coronary artery disease)   ? DES to circumflex 12/2017  ? Diabetes mellitus without complication ([place])   ? GERD (gastroesophageal reflux disease)   ? Glaucoma   ? Gout   ? Headache   ? History of kidney stones   ? Hypertension   ? NSTEMI (non-ST elevated myocardial infarction) ([place]) 12/31/2017  ? Paroxysmal atrial fibrillation (H[facility])   ? Pericardial effusion   ? Idiopathic, recurrent pericardial effusion s/p pericardial window.  ? Prostate cancer [facility]) 2007  ? S/P Seed implants   ? Supraventricular tachycardia ([place])   ? Temporal  arteritis ([place])   ? ?Past Surgical History:  ?Procedure Laterality Date  ? BIOPSY  10/12/2020  ? Procedure: BIOPSY;  Surgeon: [name], MD;  [facility];  Service: Gastroenterology;;  ? BIOPSY  02/25/2021  ? Procedure: BIOPSY;  Surgeon: [name], MD;  [facility];  Service: Gastroenterology;;  ? BIOPSY  04/11/2021  ? Procedure: BIOPSY;  Surgeon: Irving Copas., MD;  Location: [facility];  Service: Gastroenterology;;  ? CATARACT EXTRACTION W/PHACO Left 06/07/2015  ? Procedure: CATARACT EXTRACTION PHACO AND INTRAOCULAR LENS PLACEMENT LEFT EYE CDE=8.00;  Surgeon: Tonny Branch, MD;  Location: [facility] ORS;  Service: Ophthalmology;  Laterality: Left;  ? CATARACT EXTRACTION W/PHACO Right 06/17/2015  ? Procedure: CATARACT EXTRACTION PHACO AND INTRAOCULAR LENS PLACEMENT RIGHT EYE CDE=11.09;  Surgeon: Tonny Branch, MD;  Location: [facility] OR[facility];  Service: Ophthalmology;  Laterality: Right;  ? COLONOSCOPY WITH PROPOFOL N/A 10/12/2020  ? Procedure: COLONOSCOPY WITH PROPOFOL;  Surgeon: [name], MD;  [facility];  Service: Gastroenterology;  Laterality: N/A;  10:35  ? COLONOSCOPY WITH PROPOFOL N/A 02/25/2021  ? Procedure: COLONOSCOPY WITH PROPOFOL;  Surgeon: [name], MD;  [facility];  Service: Gastroenterology;  Laterality: N/A;  8:10  ? CORONARY ANGIOPLASTY WITH STENT PLACEMENT  01/01/2018  ? CORONARY STENT INTERVENTION N/A 01/01/2018  ? Procedure: CORONARY STENT INTERVENTION;  Surgeon: Jettie Booze, MD;  Location: [facility] INVASIVE CV  LAB;  Service: Cardiovascular;  Laterality: N/A;  ? ENDOSCOPIC MUCOSAL RESECTION N/A 04/11/2021  ? Procedure: ENDOSCOPIC MUCOSAL RESECTION;  Surgeon: Rush Landmark Telford Nab., MD;  Location: [facility];  Service: Gastroenterology;  Laterality: N/A;  ? ENTEROSCOPY N/A 10/12/2020  ? Procedure: PUSH ENTEROSCOPY;  Surgeon: [name], MD;  [facility];  Service:  Gastroenterology;  Laterality: N/A;  ? ENTEROSCOPY N/A 04/11/2021  ? Procedure: ENTEROSCOPY;  Surgeon: Rush Landmark Telford Nab., MD;  Location: [facility];  Service: Gastroenterology;  Laterality: N/A;  ? ESOPHAGOGASTRODUODENOSCOPY (EGD) WITH PROPOFOL N/A 10/12/2020  ? Procedure: ESOPHAGOGASTRODUODENOSCOPY (EGD) WITH PROPOFOL;  Surgeon: [name], MD;  [facility];  Service: Gastroenterology;  Laterality: N/A;  ? FRACTURE SURGERY Left 2010  ? ankle  ? HEMOSTASIS CLIP PLACEMENT  04/11/2021  ? Procedure: HEMOSTASIS CLIP PLACEMENT;  Surgeon: Irving Copas., MD;  Location: [facility];  Service: Gastroenterology;;  ? [place]  2007  ? KNEE ARTHROSCOPY Left   ? LAPAROSCOPY N/A 05/22/2021  ? Procedure: LAPAROSCOPY DIAGNOSTIC WITH WASHOUT OF ABDOMEN;  Surgeon: Leighton Ruff, MD;  Location: [facility];  Service: General;  Laterality: N/A;  ? LEFT HEART CATH AND CORONARY ANGIOGRAPHY N/A 01/01/2018  ? Procedure: LEFT HEART CATH AND CORONARY ANGIOGRAPHY;  Surgeon: Jettie Booze, MD;  Location: [facility] CV LAB;  Service: Cardiovascular;  Laterality: N/A;  ? ORIF TIBIA & FIBULA FRACTURES Left 2003  ? Distal tibial/fibula   ? PERICARDIAL WINDOW  02/2007  ? PERICARDIOCENTESIS  2007  ? hx/notes 10/19/2011  ? POLYPECTOMY  10/12/2020  ? Procedure: POLYPECTOMY;  Surgeon: [name], MD;  [facility];  Service: Gastroenterology;;  small bowel, cecal  ? POLYPECTOMY  02/25/2021  ? Procedure: POLYPECTOMY;  Surgeon: [name], MD;  [facility];  Service: Gastroenterology;;  transverse colon x2  ? SUBMUCOSAL LIFTING INJECTION  04/11/2021  ? Procedure: SUBMUCOSAL LIFTING INJECTION;  Surgeon: Irving Copas., MD;  Location: [facility];  Service: Gastroenterology;;  ? SUBMUCOSAL TATTOO INJECTION  04/11/2021  ? Procedure: SUBMUCOSAL TATTOO INJECTION;  Surgeon: Irving Copas., MD;  Location: [facility];   Service: Gastroenterology;;  ? ? ?SOCIAL HISTORY:  ?Social History  ? ?Socioeconomic History  ? Marital status: Married  ?  Spouse name: Not on file  ? Number of children: Not on file  ? Years of education: Not on file  ? Highest education level: Not on file  ?Occupational History  ? Not on file  ?Tobacco Use  ? Smoking status: Former  ?  Packs/day: 0.30  ?  Years: 10.00  ?  Pack years: 3.00  ?  Types: Cigarettes  ?  Start date: 08/06/1958  ?  Quit date: 1968  ?  Years since quitting: 55.3  ? Smokeless tobacco: Never  ?Vaping Use  ? Vaping Use: Never used  ?Substance and Sexual Activity  ? Alcohol use: Not Currently  ? Drug use: Never  ? Sexual activity: Yes  ?Other Topics Concern  ? Not on file  ?Social History Narrative  ? Not on file  ? ?Social Determinants of Health  ? ?Financial Resource Strain: Not on file  ?Food Insecurity: Not on file  ?Transportation Needs: Not on file  ?Physical Activity: Not on file  ?Stress: Not on file  ?Social Connections: Not on file  ?Intimate Partner Violence: Not on file  ? ? ?FAMILY HISTORY:  ?Family History  ?Problem Relation Age of Onset  ? CAD Mother   ?  MI in 32s  ? Prostate cancer Father   ? Prostate cancer Brother   ? Lung cancer Brother   ? ? ?CURRENT MEDICATIONS:  ?Current Outpatient Medications  ?Medication Sig Dispense Refill  ? apixaban (ELIQUIS) 5 MG TABS tablet Take 1 tablet (5 mg total) by mouth 2 (two) times daily. 60 tablet 6  ? atorvastatin (LIPITOR) 80 MG tablet TAKE 1 TABLET BY MOUTH ONCE DAILY AT  6  PM 90 tablet 0  ? benazepril (LOTENSIN) 40 MG tablet TAKE 1 TABLET BY MOUTH ONCE DAILY** NEEDS  OFFICE  VISIT 90 tablet 1  ? capecitabine (XELODA) 500 MG tablet Take 2 tablets ('1000mg'$ ) by mouth twice daily for 7 days, then increase to 3 tablets ('1500mg'$ ) twice daily for 7 days, then hold for 7 days. Take with food. 70 tablet 0  ? diltiazem (CARDIZEM CD) 180 MG 24 hr capsule Take 1 capsule (180 mg total) by mouth daily. 30 capsule 1  ? dorzolamide (TRUSOPT) 2 %  ophthalmic solution Place 1 drop into both eyes 2 (two) times daily.    ? febuxostat (ULORIC) 40 MG tablet Take 1 tablet (40 mg total) by mouth daily. 30 tablet 6  ? FERREX 150 150 MG capsule Take 150 mg by mouth 2 (

## 2021-07-12 NOTE — Patient Instructions (Addendum)
[place] at [facility] ?Discharge Instructions ? ? ?You were seen and examined today by Dr. Delton Coombes. ? ?He reviewed your lab work which was normal/stable.  ? ?We will increase the dose of the Xeloda pills. Starting tomorrow, increase the dose to 3 pills in the morning and 3 pills in the evening.  ? ?Make sure you use a lot of moisturizing lotion on your hands and feet to prevent blistering and peeling of the skin on the palms of your hands and soles of your feet.  ? ?Return as scheduled in 2 weeks.  ? ? ? ? ?Thank you for choosing [place] at [facility] to provide your oncology and hematology care.  To afford each patient quality time with our provider, please arrive at least 15 minutes before your scheduled appointment time.  ? ?If you have a lab appointment with the [facility] please come in thru the Main Entrance and check in at the main information desk. ? ?You need to re-schedule your appointment should you arrive 10 or more minutes late.  We strive to give you quality time with our providers, and arriving late affects you and other patients whose appointments are after yours.  Also, if you no show three or more times for appointments you may be dismissed from the clinic at the providers discretion.     ?Again, thank you for choosing [facility].  Our hope is that these requests will decrease the amount of time that you wait before being seen by our physicians.       ?_____________________________________________________________ ? ?Should you have questions after your visit to [facility], please contact our office at [phone] and follow the prompts.  Our office hours are 8:00 a.m. and 4:30 p.m. Monday - Friday.  Please note that voicemails left after 4:00 p.m. may not be returned until the following business day.  We are closed weekends and major holidays.  You do have access to a nurse 24-7, just call the main number to  the clinic [phone] and do not press any options, hold on the line and a nurse will answer the phone.   ? ?For prescription refill requests, have your pharmacy contact our office and allow 72 hours.   ? ?Due to Covid, you will need to wear a mask upon entering the hospital. If you do not have a mask, a mask will be given to you at the Main Entrance upon arrival. For doctor visits, patients may have 1 support person age 51 or older with them. For treatment visits, patients can not have anyone with them due to social distancing guidelines and our immunocompromised population.  ? ?   ?

## 2021-07-12 NOTE — Progress Notes (Signed)
Patient is taking Xeloda as prescribed.  He has not missed any doses and reports no side effects at this time.   

## 2021-07-14 ENCOUNTER — Other Ambulatory Visit (HOSPITAL_COMMUNITY): Payer: Self-pay | Admitting: Hematology

## 2021-07-14 ENCOUNTER — Other Ambulatory Visit (HOSPITAL_COMMUNITY): Payer: Self-pay

## 2021-07-14 ENCOUNTER — Other Ambulatory Visit: Payer: Self-pay | Admitting: *Deleted

## 2021-07-14 DIAGNOSIS — C184 Malignant neoplasm of transverse colon: Secondary | ICD-10-CM

## 2021-07-14 MED ORDER — CAPECITABINE 500 MG PO TABS
1500.0000 mg | ORAL_TABLET | Freq: Two times a day (BID) | ORAL | 0 refills | Status: DC
  Filled 2021-07-14: qty 126, 28d supply, fill #0

## 2021-07-14 NOTE — Telephone Encounter (Signed)
Per last ovn, Xeloda increased to 3 tablets 2 times a day for 21 days and 7 days off. Script sent in to reflect changes. ?

## 2021-07-15 ENCOUNTER — Other Ambulatory Visit (HOSPITAL_COMMUNITY): Payer: Self-pay

## 2021-07-18 DIAGNOSIS — E1122 Type 2 diabetes mellitus with diabetic chronic kidney disease: Secondary | ICD-10-CM | POA: Diagnosis not present

## 2021-07-18 DIAGNOSIS — N189 Chronic kidney disease, unspecified: Secondary | ICD-10-CM | POA: Diagnosis not present

## 2021-07-18 DIAGNOSIS — Z483 Aftercare following surgery for neoplasm: Secondary | ICD-10-CM | POA: Diagnosis not present

## 2021-07-18 DIAGNOSIS — I10 Essential (primary) hypertension: Secondary | ICD-10-CM | POA: Diagnosis not present

## 2021-07-18 DIAGNOSIS — C189 Malignant neoplasm of colon, unspecified: Secondary | ICD-10-CM | POA: Diagnosis not present

## 2021-07-18 DIAGNOSIS — D132 Benign neoplasm of duodenum: Secondary | ICD-10-CM | POA: Diagnosis not present

## 2021-07-18 DIAGNOSIS — D62 Acute posthemorrhagic anemia: Secondary | ICD-10-CM | POA: Diagnosis not present

## 2021-07-18 DIAGNOSIS — I480 Paroxysmal atrial fibrillation: Secondary | ICD-10-CM | POA: Diagnosis not present

## 2021-07-20 ENCOUNTER — Other Ambulatory Visit (HOSPITAL_COMMUNITY): Payer: Self-pay

## 2021-07-21 ENCOUNTER — Other Ambulatory Visit (HOSPITAL_COMMUNITY): Payer: Self-pay

## 2021-07-22 ENCOUNTER — Other Ambulatory Visit (HOSPITAL_COMMUNITY): Payer: Self-pay

## 2021-07-27 ENCOUNTER — Inpatient Hospital Stay (HOSPITAL_COMMUNITY): Payer: Medicare HMO

## 2021-07-27 ENCOUNTER — Inpatient Hospital Stay (HOSPITAL_COMMUNITY): Payer: Medicare HMO | Attending: Hematology | Admitting: Hematology

## 2021-07-27 ENCOUNTER — Encounter (HOSPITAL_COMMUNITY): Payer: Self-pay | Admitting: Hematology

## 2021-07-27 VITALS — BP 120/54 | HR 64 | Temp 97.4°F | Ht 70.0 in | Wt 209.0 lb

## 2021-07-27 DIAGNOSIS — Z8546 Personal history of malignant neoplasm of prostate: Secondary | ICD-10-CM | POA: Diagnosis not present

## 2021-07-27 DIAGNOSIS — C184 Malignant neoplasm of transverse colon: Secondary | ICD-10-CM

## 2021-07-27 DIAGNOSIS — Z801 Family history of malignant neoplasm of trachea, bronchus and lung: Secondary | ICD-10-CM | POA: Insufficient documentation

## 2021-07-27 DIAGNOSIS — C189 Malignant neoplasm of colon, unspecified: Secondary | ICD-10-CM | POA: Insufficient documentation

## 2021-07-27 DIAGNOSIS — Z87891 Personal history of nicotine dependence: Secondary | ICD-10-CM | POA: Insufficient documentation

## 2021-07-27 DIAGNOSIS — Z8042 Family history of malignant neoplasm of prostate: Secondary | ICD-10-CM | POA: Insufficient documentation

## 2021-07-27 DIAGNOSIS — D509 Iron deficiency anemia, unspecified: Secondary | ICD-10-CM | POA: Diagnosis not present

## 2021-07-27 DIAGNOSIS — D649 Anemia, unspecified: Secondary | ICD-10-CM | POA: Insufficient documentation

## 2021-07-27 DIAGNOSIS — Z79899 Other long term (current) drug therapy: Secondary | ICD-10-CM | POA: Diagnosis not present

## 2021-07-27 LAB — CBC WITH DIFFERENTIAL/PLATELET
Abs Immature Granulocytes: 0.03 10*3/uL (ref 0.00–0.07)
Basophils Absolute: 0 10*3/uL (ref 0.0–0.1)
Basophils Relative: 1 %
Eosinophils Absolute: 0.1 10*3/uL (ref 0.0–0.5)
Eosinophils Relative: 1 %
HCT: 36.5 % — ABNORMAL LOW (ref 39.0–52.0)
Hemoglobin: 11.8 g/dL — ABNORMAL LOW (ref 13.0–17.0)
Immature Granulocytes: 1 %
Lymphocytes Relative: 17 %
Lymphs Abs: 0.9 10*3/uL (ref 0.7–4.0)
MCH: 31.4 pg (ref 26.0–34.0)
MCHC: 32.3 g/dL (ref 30.0–36.0)
MCV: 97.1 fL (ref 80.0–100.0)
Monocytes Absolute: 0.5 10*3/uL (ref 0.1–1.0)
Monocytes Relative: 9 %
Neutro Abs: 4 10*3/uL (ref 1.7–7.7)
Neutrophils Relative %: 71 %
Platelets: 195 10*3/uL (ref 150–400)
RBC: 3.76 MIL/uL — ABNORMAL LOW (ref 4.22–5.81)
RDW: 17 % — ABNORMAL HIGH (ref 11.5–15.5)
WBC: 5.6 10*3/uL (ref 4.0–10.5)
nRBC: 0 % (ref 0.0–0.2)

## 2021-07-27 LAB — COMPREHENSIVE METABOLIC PANEL
ALT: 27 U/L (ref 0–44)
AST: 30 U/L (ref 15–41)
Albumin: 3.8 g/dL (ref 3.5–5.0)
Alkaline Phosphatase: 88 U/L (ref 38–126)
Anion gap: 7 (ref 5–15)
BUN: 16 mg/dL (ref 8–23)
CO2: 26 mmol/L (ref 22–32)
Calcium: 9.4 mg/dL (ref 8.9–10.3)
Chloride: 107 mmol/L (ref 98–111)
Creatinine, Ser: 0.97 mg/dL (ref 0.61–1.24)
GFR, Estimated: >60 mL/min (ref >60)
Glucose, Bld: 154 mg/dL — ABNORMAL HIGH (ref 70–99)
Potassium: 4 mmol/L (ref 3.5–5.1)
Sodium: 140 mmol/L (ref 135–145)
Total Bilirubin: 0.6 mg/dL (ref 0.3–1.2)
Total Protein: 6.7 g/dL (ref 6.5–8.1)

## 2021-07-27 LAB — MAGNESIUM: Magnesium: 1.9 mg/dL (ref 1.7–2.4)

## 2021-07-27 NOTE — Patient Instructions (Addendum)
[place] at [facility] ?Discharge Instructions ? ? ?You were seen and examined today by Dr. Delton Coombes. ? ?He reviewed your lab work which is normal/stable. ? ?Continue Xeloda as prescribed.  ? ?Return as scheduled in 3 weeks.  ? ? ?Thank you for choosing [place] at [facility] to provide your oncology and hematology care.  To afford each patient quality time with our provider, please arrive at least 15 minutes before your scheduled appointment time.  ? ?If you have a lab appointment with the [facility] please come in thru the Main Entrance and check in at the main information desk. ? ?You need to re-schedule your appointment should you arrive 10 or more minutes late.  We strive to give you quality time with our providers, and arriving late affects you and other patients whose appointments are after yours.  Also, if you no show three or more times for appointments you may be dismissed from the clinic at the providers discretion.     ?Again, thank you for choosing [facility].  Our hope is that these requests will decrease the amount of time that you wait before being seen by our physicians.       ?_____________________________________________________________ ? ?Should you have questions after your visit to [facility], please contact our office at [phone] and follow the prompts.  Our office hours are 8:00 a.m. and 4:30 p.m. Monday - Friday.  Please note that voicemails left after 4:00 p.m. may not be returned until the following business day.  We are closed weekends and major holidays.  You do have access to a nurse 24-7, just call the main number to the clinic [phone] and do not press any options, hold on the line and a nurse will answer the phone.   ? ?For prescription refill requests, have your pharmacy contact our office and allow 72 hours.   ? ?Due to Covid, you will need to wear a mask upon entering the hospital. If  you do not have a mask, a mask will be given to you at the Main Entrance upon arrival. For doctor visits, patients may have 1 support person age 68 or older with them. For treatment visits, patients can not have anyone with them due to social distancing guidelines and our immunocompromised population.  ? ?   ?

## 2021-07-27 NOTE — Progress Notes (Signed)
Patient is taking Xeloda as prescribed.  He has not missed any doses and reports no side effects at this time.   

## 2021-07-27 NOTE — Progress Notes (Signed)
? ?[name] [name] ?[address] ?[place], [place] [address] ? ? ?CLINIC:  ?Medical Oncology/Hematology ? ?PCP:  ?Caryl Bis, MD ?[address] [place] [address] ?[phone] ? ? ?REASON FOR VISIT:  ?Follow-up for colon cancer ? ?PRIOR THERAPY: none ? ?NGS Results: not done ? ?CURRENT THERAPY: surveillance ? ?BRIEF ONCOLOGIC HISTORY:  ?Oncology History  ? No history exists.  ? ? ?CANCER STAGING: ? Cancer Staging  ?Colon cancer ([place]) ?Staging form: Colon and Rectum, AJCC 8th Edition ?- Clinical stage from 03/22/2021: Stage I (cT1, cN0, cM0) - Unsigned ?- Pathologic stage from 06/23/2021: Stage IIIA (pT1, pN1b, cM0) - Unsigned ? ? ?INTERVAL HISTORY:  ?[name] [name], a 81 y.o. male, returns for routine follow-up of his colon cancer. [name] [name] was last seen on 07/12/2021.  ? ?Today he reports feeling good. He denies n/v/d and mouth sores. He reports occasional mild burning pain in his lower abdomen.  ? ?REVIEW OF SYSTEMS:  ?Review of Systems  ?Constitutional:  Negative for appetite change and fatigue.  ?HENT:   Negative for mouth sores.   ?Gastrointestinal:  Positive for abdominal pain (occasional). Negative for diarrhea, nausea and vomiting.  ?All other systems reviewed and are negative. ? ?PAST MEDICAL/SURGICAL HISTORY:  ?Past Medical History:  ?Diagnosis Date  ? Anemia   ? Arthritis   ? CAD (coronary artery disease)   ? DES to circumflex 12/2017  ? Diabetes mellitus without complication ([place])   ? GERD (gastroesophageal reflux disease)   ? Glaucoma   ? Gout   ? Headache   ? History of kidney stones   ? Hypertension   ? NSTEMI (non-ST elevated myocardial infarction) ([place]) 12/31/2017  ? Paroxysmal atrial fibrillation ([facility])   ? Pericardial effusion   ? Idiopathic, recurrent pericardial effusion s/p pericardial window.  ? Prostate cancer [facility]) 2007  ? S/P Seed implants   ? Supraventricular tachycardia ([place])   ? Temporal arteritis ([place])   ? ?Past Surgical History:  ?Procedure Laterality Date  ? BIOPSY   10/12/2020  ? Procedure: BIOPSY;  Surgeon: [name], MD;  [facility];  Service: Gastroenterology;;  ? BIOPSY  02/25/2021  ? Procedure: BIOPSY;  Surgeon: [name], MD;  [facility];  Service: Gastroenterology;;  ? BIOPSY  04/11/2021  ? Procedure: BIOPSY;  Surgeon: Irving Copas., MD;  Location: [facility];  Service: Gastroenterology;;  ? CATARACT EXTRACTION W/PHACO Left 06/07/2015  ? Procedure: CATARACT EXTRACTION PHACO AND INTRAOCULAR LENS PLACEMENT LEFT EYE CDE=8.00;  Surgeon: Tonny Branch, MD;  [facility];  Service: Ophthalmology;  Laterality: Left;  ? CATARACT EXTRACTION W/PHACO Right 06/17/2015  ? Procedure: CATARACT EXTRACTION PHACO AND INTRAOCULAR LENS PLACEMENT RIGHT EYE CDE=11.09;  Surgeon: Tonny Branch, MD;  [facility];  Service: Ophthalmology;  Laterality: Right;  ? COLONOSCOPY WITH PROPOFOL N/A 10/12/2020  ? Procedure: COLONOSCOPY WITH PROPOFOL;  Surgeon: [name], MD;  [facility];  Service: Gastroenterology;  Laterality: N/A;  10:35  ? COLONOSCOPY WITH PROPOFOL N/A 02/25/2021  ? Procedure: COLONOSCOPY WITH PROPOFOL;  Surgeon: [name], MD;  [facility];  Service: Gastroenterology;  Laterality: N/A;  8:10  ? CORONARY ANGIOPLASTY WITH STENT PLACEMENT  01/01/2018  ? CORONARY STENT INTERVENTION N/A 01/01/2018  ? Procedure: CORONARY STENT INTERVENTION;  Surgeon: Jettie Booze, MD;  Location: [facility];  Service: Cardiovascular;  Laterality: N/A;  ? ENDOSCOPIC MUCOSAL RESECTION N/A 04/11/2021  ? Procedure: ENDOSCOPIC MUCOSAL  RESECTION;  Surgeon: Rush Landmark Telford Nab., MD;  Location: [facility];  Service: Gastroenterology;  Laterality: N/A;  ? ENTEROSCOPY N/A 10/12/2020  ? Procedure: PUSH ENTEROSCOPY;  Surgeon: [name], MD;  [facility];  Service: Gastroenterology;  Laterality: N/A;  ? ENTEROSCOPY N/A 04/11/2021  ? Procedure:  ENTEROSCOPY;  Surgeon: Rush Landmark Telford Nab., MD;  Location: [facility];  Service: Gastroenterology;  Laterality: N/A;  ? ESOPHAGOGASTRODUODENOSCOPY (EGD) WITH PROPOFOL N/A 10/12/2020  ? Procedure: ESOPHAGOGASTRODUODENOSCOPY (EGD) WITH PROPOFOL;  Surgeon: [name], MD;  [facility];  Service: Gastroenterology;  Laterality: N/A;  ? FRACTURE SURGERY Left 2010  ? ankle  ? HEMOSTASIS CLIP PLACEMENT  04/11/2021  ? Procedure: HEMOSTASIS CLIP PLACEMENT;  Surgeon: Irving Copas., MD;  Location: [facility];  Service: Gastroenterology;;  ? Magazine  2007  ? KNEE ARTHROSCOPY Left   ? LAPAROSCOPY N/A 05/22/2021  ? Procedure: LAPAROSCOPY DIAGNOSTIC WITH WASHOUT OF ABDOMEN;  Surgeon: Leighton Ruff, MD;  [facility];  Service: General;  Laterality: N/A;  ? LEFT HEART CATH AND CORONARY ANGIOGRAPHY N/A 01/01/2018  ? Procedure: LEFT HEART CATH AND CORONARY ANGIOGRAPHY;  Surgeon: Jettie Booze, MD;  Location: [place] [facility];  Service: Cardiovascular;  Laterality: N/A;  ? ORIF TIBIA & FIBULA FRACTURES Left 2003  ? Distal tibial/fibula   ? PERICARDIAL WINDOW  02/2007  ? PERICARDIOCENTESIS  2007  ? hx/notes 10/19/2011  ? POLYPECTOMY  10/12/2020  ? Procedure: POLYPECTOMY;  Surgeon: [name], MD;  [facility];  Service: Gastroenterology;;  small bowel, cecal  ? POLYPECTOMY  02/25/2021  ? Procedure: POLYPECTOMY;  Surgeon: [name], MD;  [facility];  Service: Gastroenterology;;  transverse colon x2  ? SUBMUCOSAL LIFTING INJECTION  04/11/2021  ? Procedure: SUBMUCOSAL LIFTING INJECTION;  Surgeon: Irving Copas., MD;  Location: [facility];  Service: Gastroenterology;;  ? SUBMUCOSAL TATTOO INJECTION  04/11/2021  ? Procedure: SUBMUCOSAL TATTOO INJECTION;  Surgeon: Irving Copas., MD;  Location: [facility];  Service: Gastroenterology;;  ? ? ?SOCIAL HISTORY:  ?Social History  ? ?Socioeconomic  History  ? Marital status: Married  ?  Spouse name: Not on file  ? Number of children: Not on file  ? Years of education: Not on file  ? Highest education level: Not on file  ?Occupational History  ? Not on file  ?Tobacco Use  ? Smoking status: Former  ?  Packs/day: 0.30  ?  Years: 10.00  ?  Pack years: 3.00  ?  Types: Cigarettes  ?  Start date: 08/06/1958  ?  Quit date: 1968  ?  Years since quitting: 55.3  ? Smokeless tobacco: Never  ?Vaping Use  ? Vaping Use: Never used  ?Substance and Sexual Activity  ? Alcohol use: Not Currently  ? Drug use: Never  ? Sexual activity: Yes  ?Other Topics Concern  ? Not on file  ?Social History Narrative  ? Not on file  ? ?Social Determinants of Health  ? ?Financial Resource Strain: Not on file  ?Food Insecurity: Not on file  ?Transportation Needs: Not on file  ?Physical Activity: Not on file  ?Stress: Not on file  ?Social Connections: Not on file  ?Intimate Partner Violence: Not on file  ? ? ?FAMILY HISTORY:  ?Family History  ?Problem Relation Age of Onset  ? CAD Mother   ?     MI in 84s  ? Prostate cancer Father   ? Prostate cancer Brother   ?  Lung cancer Brother   ? ? ?CURRENT MEDICATIONS:  ?Current Outpatient Medications  ?Medication Sig Dispense Refill  ? apixaban (ELIQUIS) 5 MG TABS tablet Take 1 tablet (5 mg total) by mouth 2 (two) times daily. 60 tablet 6  ? atorvastatin (LIPITOR) 80 MG tablet TAKE 1 TABLET BY MOUTH ONCE DAILY AT  6  PM 90 tablet 0  ? benazepril (LOTENSIN) 40 MG tablet TAKE 1 TABLET BY MOUTH ONCE DAILY** NEEDS  OFFICE  VISIT 90 tablet 1  ? capecitabine (XELODA) 500 MG tablet Take 3 tablets (1,500 mg total) by mouth 2 (two) times daily after a meal. Take 3 tablets 2 times a day for 21 days and off for 7 days.  Take with food. 126 tablet 0  ? diltiazem (CARDIZEM CD) 180 MG 24 hr capsule Take 1 capsule (180 mg total) by mouth daily. 30 capsule 1  ? dorzolamide (TRUSOPT) 2 % ophthalmic solution Place 1 drop into both eyes 2 (two) times daily.    ? febuxostat  (ULORIC) 40 MG tablet Take 1 tablet (40 mg total) by mouth daily. 30 tablet 6  ? FERREX 150 150 MG capsule Take 150 mg by mouth 2 (two) times daily.    ? hydrALAZINE (APRESOLINE) 100 MG tablet Take 100 mg by mouth 2 (tw

## 2021-08-01 ENCOUNTER — Other Ambulatory Visit: Payer: Self-pay | Admitting: Cardiology

## 2021-08-03 ENCOUNTER — Encounter (HOSPITAL_COMMUNITY): Payer: Self-pay

## 2021-08-03 NOTE — Progress Notes (Signed)
Patient's wife called and reports redness and tenderness of patient's feet. Reports no blisters or peeling of the skin. Patient has completed one cycle of xeloda and is on day 7 of cycle 2. Dr. Lorenso Courier made aware and recommends moisturizing both hands and feet while continuing to monitor. Patient's wife made aware of recommendation and verbalized understanding. ?

## 2021-08-09 ENCOUNTER — Other Ambulatory Visit (HOSPITAL_COMMUNITY): Payer: Self-pay

## 2021-08-10 DIAGNOSIS — E1169 Type 2 diabetes mellitus with other specified complication: Secondary | ICD-10-CM | POA: Diagnosis not present

## 2021-08-10 DIAGNOSIS — D519 Vitamin B12 deficiency anemia, unspecified: Secondary | ICD-10-CM | POA: Diagnosis not present

## 2021-08-10 DIAGNOSIS — E782 Mixed hyperlipidemia: Secondary | ICD-10-CM | POA: Diagnosis not present

## 2021-08-10 DIAGNOSIS — E7849 Other hyperlipidemia: Secondary | ICD-10-CM | POA: Diagnosis not present

## 2021-08-10 DIAGNOSIS — D529 Folate deficiency anemia, unspecified: Secondary | ICD-10-CM | POA: Diagnosis not present

## 2021-08-10 DIAGNOSIS — E1122 Type 2 diabetes mellitus with diabetic chronic kidney disease: Secondary | ICD-10-CM | POA: Diagnosis not present

## 2021-08-10 DIAGNOSIS — E1165 Type 2 diabetes mellitus with hyperglycemia: Secondary | ICD-10-CM | POA: Diagnosis not present

## 2021-08-10 DIAGNOSIS — D649 Anemia, unspecified: Secondary | ICD-10-CM | POA: Diagnosis not present

## 2021-08-17 ENCOUNTER — Other Ambulatory Visit (HOSPITAL_COMMUNITY): Payer: Self-pay

## 2021-08-17 ENCOUNTER — Inpatient Hospital Stay (HOSPITAL_COMMUNITY): Payer: Medicare HMO

## 2021-08-17 ENCOUNTER — Encounter (HOSPITAL_COMMUNITY): Payer: Self-pay | Admitting: Hematology

## 2021-08-17 ENCOUNTER — Inpatient Hospital Stay (HOSPITAL_COMMUNITY): Payer: Medicare HMO | Admitting: Hematology

## 2021-08-17 VITALS — BP 124/50 | HR 61 | Temp 97.5°F | Resp 18 | Wt 205.0 lb

## 2021-08-17 DIAGNOSIS — C189 Malignant neoplasm of colon, unspecified: Secondary | ICD-10-CM | POA: Diagnosis not present

## 2021-08-17 DIAGNOSIS — Z8042 Family history of malignant neoplasm of prostate: Secondary | ICD-10-CM | POA: Diagnosis not present

## 2021-08-17 DIAGNOSIS — Z8546 Personal history of malignant neoplasm of prostate: Secondary | ICD-10-CM | POA: Diagnosis not present

## 2021-08-17 DIAGNOSIS — Z801 Family history of malignant neoplasm of trachea, bronchus and lung: Secondary | ICD-10-CM | POA: Diagnosis not present

## 2021-08-17 DIAGNOSIS — Z79899 Other long term (current) drug therapy: Secondary | ICD-10-CM | POA: Diagnosis not present

## 2021-08-17 DIAGNOSIS — D649 Anemia, unspecified: Secondary | ICD-10-CM | POA: Diagnosis not present

## 2021-08-17 DIAGNOSIS — C184 Malignant neoplasm of transverse colon: Secondary | ICD-10-CM

## 2021-08-17 DIAGNOSIS — Z87891 Personal history of nicotine dependence: Secondary | ICD-10-CM | POA: Diagnosis not present

## 2021-08-17 LAB — COMPREHENSIVE METABOLIC PANEL
ALT: 22 U/L (ref 0–44)
AST: 25 U/L (ref 15–41)
Albumin: 3.8 g/dL (ref 3.5–5.0)
Alkaline Phosphatase: 80 U/L (ref 38–126)
Anion gap: 4 — ABNORMAL LOW (ref 5–15)
BUN: 17 mg/dL (ref 8–23)
CO2: 26 mmol/L (ref 22–32)
Calcium: 9.4 mg/dL (ref 8.9–10.3)
Chloride: 106 mmol/L (ref 98–111)
Creatinine, Ser: 0.88 mg/dL (ref 0.61–1.24)
GFR, Estimated: >60 mL/min (ref >60)
Glucose, Bld: 186 mg/dL — ABNORMAL HIGH (ref 70–99)
Potassium: 3.9 mmol/L (ref 3.5–5.1)
Sodium: 136 mmol/L (ref 135–145)
Total Bilirubin: 0.4 mg/dL (ref 0.3–1.2)
Total Protein: 6.5 g/dL (ref 6.5–8.1)

## 2021-08-17 LAB — CBC WITH DIFFERENTIAL/PLATELET
Abs Immature Granulocytes: 0.03 10*3/uL (ref 0.00–0.07)
Basophils Absolute: 0.1 10*3/uL (ref 0.0–0.1)
Basophils Relative: 1 %
Eosinophils Absolute: 0.1 10*3/uL (ref 0.0–0.5)
Eosinophils Relative: 1 %
HCT: 35.3 % — ABNORMAL LOW (ref 39.0–52.0)
Hemoglobin: 11.9 g/dL — ABNORMAL LOW (ref 13.0–17.0)
Immature Granulocytes: 1 %
Lymphocytes Relative: 13 %
Lymphs Abs: 0.9 10*3/uL (ref 0.7–4.0)
MCH: 32.8 pg (ref 26.0–34.0)
MCHC: 33.7 g/dL (ref 30.0–36.0)
MCV: 97.2 fL (ref 80.0–100.0)
Monocytes Absolute: 0.7 10*3/uL (ref 0.1–1.0)
Monocytes Relative: 10 %
Neutro Abs: 5 10*3/uL (ref 1.7–7.7)
Neutrophils Relative %: 74 %
Platelets: 184 10*3/uL (ref 150–400)
RBC: 3.63 MIL/uL — ABNORMAL LOW (ref 4.22–5.81)
RDW: 17.9 % — ABNORMAL HIGH (ref 11.5–15.5)
WBC: 6.7 10*3/uL (ref 4.0–10.5)
nRBC: 0 % (ref 0.0–0.2)

## 2021-08-17 LAB — MAGNESIUM: Magnesium: 1.9 mg/dL (ref 1.7–2.4)

## 2021-08-17 MED ORDER — HYDROCORTISONE 1 % EX LOTN: 1.0000 "application " | TOPICAL_LOTION | Freq: Two times a day (BID) | CUTANEOUS | 3 refills | Status: DC

## 2021-08-17 NOTE — Progress Notes (Signed)
Patient is taking Xeloda as prescribed.  He has not missed any doses and reports no side effects at this time.   

## 2021-08-17 NOTE — Progress Notes (Signed)
[name] [name], [place] [address]   CLINIC:  Medical Oncology/Hematology  PCP:  [name] Bis, MD [address] [place] [address] [phone]   REASON FOR VISIT:  Follow-up for colon cancer  PRIOR THERAPY: none  NGS Results: not done  CURRENT THERAPY: surveillance  BRIEF ONCOLOGIC HISTORY:  Oncology History   No history exists.    CANCER STAGING: Cancer Staging  Colon cancer [facility]) Staging form: Colon and Rectum, AJCC 8th Edition - Clinical stage from 03/22/2021: Stage I (cT1, cN0, cM0) - Unsigned - Pathologic stage from 06/23/2021: Stage IIIA (pT1, pN1b, cM0) - Unsigned   INTERVAL HISTORY:  [name], a 81 y.o. male, returns for routine follow-up of his colon cancer. [name] was last seen on 07/27/2021.   Today he reports feeling fair. Following increasing Xeloda to 3 tablets BID he reports he started having severe pain in his hands and feet, red bumps on his buttock, and painful blistering on his penis. The pain in his hands is preventing him from being able to form a fist or have grip strength. He also reports he had diarrhea which was resolved with Imodium. He reports he is using moisturizing lotion on his hands and arms, and he has taken tylenol which has not relieved the pain. He denies mouth sores, nausea, and vomiting.   REVIEW OF SYSTEMS:  Review of Systems  Constitutional:  Positive for fatigue. Negative for appetite change.  HENT:   Negative for mouth sores.   Gastrointestinal:  Negative for diarrhea (controlled), nausea and vomiting.  Musculoskeletal:  Positive for arthralgias (10/10 feet and hands).  Skin:  Positive for rash (buttock and penis).  All other systems reviewed and are negative.  PAST MEDICAL/SURGICAL HISTORY:  Past Medical History:  Diagnosis Date   Anemia    Arthritis    CAD (coronary artery disease)    DES to circumflex 12/2017   Diabetes mellitus without complication ([facility])    GERD  (gastroesophageal reflux disease)    Glaucoma    Gout    Headache    History of kidney stones    Hypertension    NSTEMI (non-ST elevated myocardial infarction) ([place]) 12/31/2017   Paroxysmal atrial fibrillation ([facility])    Pericardial effusion    Idiopathic, recurrent pericardial effusion s/p pericardial window.   Prostate cancer ([place]) 2007   S/P Seed implants    Supraventricular tachycardia ([place])    Temporal arteritis ([place])    Past Surgical History:  Procedure Laterality Date   BIOPSY  10/12/2020   Procedure: BIOPSY;  Surgeon: [name], MD;  [facility];  Service: Gastroenterology;;   BIOPSY  02/25/2021   Procedure: BIOPSY;  Surgeon: [name], MD;  [facility];  Service: Gastroenterology;;   BIOPSY  04/11/2021   Procedure: BIOPSY;  Surgeon: Irving Copas., MD;  Location: [facility];  Service: Gastroenterology;;   CATARACT EXTRACTION W/PHACO Left 06/07/2015   Procedure: CATARACT EXTRACTION PHACO AND INTRAOCULAR LENS PLACEMENT LEFT EYE CDE=8.00;  Surgeon: Tonny Branch, MD;  [facility];  Service: Ophthalmology;  Laterality: Left;   CATARACT EXTRACTION W/PHACO Right 06/17/2015   Procedure: CATARACT EXTRACTION PHACO AND INTRAOCULAR LENS PLACEMENT RIGHT EYE CDE=11.09;  Surgeon: Tonny Branch, MD;  [facility];  Service: Ophthalmology;  Laterality: Right;   COLONOSCOPY WITH PROPOFOL N/A 10/12/2020   Procedure: COLONOSCOPY WITH PROPOFOL;  Surgeon: [name], MD;  [facility];  Service: Gastroenterology;  Laterality: N/A;  10:35   COLONOSCOPY WITH PROPOFOL N/A 02/25/2021   Procedure: COLONOSCOPY WITH PROPOFOL;  Surgeon: [name], MD;  [facility];  Service: Gastroenterology;  Laterality: N/A;  8:10   CORONARY ANGIOPLASTY WITH STENT PLACEMENT  01/01/2018   CORONARY STENT INTERVENTION N/A 01/01/2018   Procedure: CORONARY STENT INTERVENTION;  Surgeon: Jettie Booze,  MD;  Location: [place] [facility];  Service: Cardiovascular;  Laterality: N/A;   ENDOSCOPIC MUCOSAL RESECTION N/A 04/11/2021   Procedure: ENDOSCOPIC MUCOSAL RESECTION;  Surgeon: Rush Landmark Telford Nab., MD;  [facility];  Service: Gastroenterology;  Laterality: N/A;   ENTEROSCOPY N/A 10/12/2020   Procedure: PUSH ENTEROSCOPY;  Surgeon: [name], MD;  [facility];  Service: Gastroenterology;  Laterality: N/A;   ENTEROSCOPY N/A 04/11/2021   Procedure: ENTEROSCOPY;  Surgeon: Rush Landmark Telford Nab., MD;  Location: [facility];  Service: Gastroenterology;  Laterality: N/A;   ESOPHAGOGASTRODUODENOSCOPY (EGD) WITH PROPOFOL N/A 10/12/2020   Procedure: ESOPHAGOGASTRODUODENOSCOPY (EGD) WITH PROPOFOL;  Surgeon: [name], MD;  [facility];  Service: Gastroenterology;  Laterality: N/A;   FRACTURE SURGERY Left 2010   ankle   HEMOSTASIS CLIP PLACEMENT  04/11/2021   Procedure: HEMOSTASIS CLIP PLACEMENT;  Surgeon: Irving Copas., MD;  [facility];  Service: Gastroenterology;;   INSERTION PROSTATE RADIATION SEED  2007   KNEE ARTHROSCOPY Left    LAPAROSCOPY N/A 05/22/2021   Procedure: LAPAROSCOPY DIAGNOSTIC WITH WASHOUT OF ABDOMEN;  Surgeon: Leighton Ruff, MD;  [facility];  Service: General;  Laterality: N/A;   LEFT HEART CATH AND CORONARY ANGIOGRAPHY N/A 01/01/2018   Procedure: LEFT HEART CATH AND CORONARY ANGIOGRAPHY;  Surgeon: Jettie Booze, MD;  Location: [facility];  Service: Cardiovascular;  Laterality: N/A;   ORIF TIBIA & FIBULA FRACTURES Left 2003   Distal tibial/fibula    PERICARDIAL WINDOW  02/2007   PERICARDIOCENTESIS  2007   hx/notes 10/19/2011   POLYPECTOMY  10/12/2020   Procedure: POLYPECTOMY;  Surgeon: [name], MD;  [facility];  Service: Gastroenterology;;  small bowel, cecal   POLYPECTOMY  02/25/2021   Procedure: POLYPECTOMY;  Surgeon: [name], MD;  [facility];  Service: Gastroenterology;;  transverse colon x2   SUBMUCOSAL LIFTING INJECTION  04/11/2021   Procedure: SUBMUCOSAL LIFTING INJECTION;  Surgeon: Irving Copas., MD;  Location[facility];  Service: Gastroenterology;;   SUBMUCOSAL TATTOO INJECTION  04/11/2021   Procedure: SUBMUCOSAL TATTOO INJECTION;  Surgeon: Irving Copas., MD;  Location: [facility];  Service: Gastroenterology;;    SOCIAL HISTORY:  Social History   Socioeconomic History   Marital status: Married    Spouse name: Not on file   Number of children: Not on file   Years of education: Not on file   Highest education level: Not on file  Occupational History   Not on file  Tobacco Use   Smoking status: Former    Packs/day: 0.30    Years: 10.00    Pack years: 3.00    Types: Cigarettes    Start date: 08/06/1958    Quit date: 1968    Years since quitting: 55.4   Smokeless tobacco: Never  Vaping Use   Vaping Use: Never used  Substance and Sexual Activity   Alcohol use: Not Currently   Drug use: Never   Sexual activity: Yes  Other Topics Concern   Not on file  Social History Narrative   Not on file  Social Determinants of Health   Financial Resource Strain: Not on file  Food Insecurity: Not on file  Transportation Needs: Not on file  Physical Activity: Not on file  Stress: Not on file  Social Connections: Not on file  Intimate Partner Violence: Not on file    FAMILY HISTORY:  Family History  Problem Relation Age of Onset   CAD Mother        MI in 50s   Prostate cancer Father    Prostate cancer Brother    Lung cancer Brother     CURRENT MEDICATIONS:  Current Outpatient Medications  Medication Sig Dispense Refill   apixaban (ELIQUIS) 5 MG TABS tablet Take 1 tablet (5 mg total) by mouth 2 (two) times daily. 60 tablet 6   atorvastatin (LIPITOR) 80 MG tablet TAKE 1 TABLET BY MOUTH ONCE DAILY AT  6  PM 90 tablet 0   benazepril (LOTENSIN) 40 MG  tablet TAKE 1 TABLET BY MOUTH ONCE DAILY** NEEDS  OFFICE  VISIT 90 tablet 1   capecitabine (XELODA) 500 MG tablet Take 3 tablets (1,500 mg total) by mouth 2 (two) times daily after a meal. Take 3 tablets 2 times a day for 21 days and off for 7 days.  Take with food. 126 tablet 0   diltiazem (CARDIZEM CD) 180 MG 24 hr capsule Take 1 capsule (180 mg total) by mouth daily. 30 capsule 1   dorzolamide (TRUSOPT) 2 % ophthalmic solution Place 1 drop into both eyes 2 (two) times daily.     febuxostat (ULORIC) 40 MG tablet Take 1 tablet (40 mg total) by mouth daily. 30 tablet 6   FERREX 150 150 MG capsule Take 150 mg by mouth 2 (two) times daily.     hydrALAZINE (APRESOLINE) 100 MG tablet Take 100 mg by mouth 2 (two) times daily.     hydrochlorothiazide (HYDRODIURIL) 12.5 MG tablet Take 12.5 mg by mouth daily.     latanoprost (XALATAN) 0.005 % ophthalmic solution Place 1 drop into both eyes at bedtime.     metFORMIN (GLUCOPHAGE-XR) 500 MG 24 hr tablet Take 500 mg by mouth 2 (two) times daily.     metoprolol succinate (TOPROL-XL) 50 MG 24 hr tablet Take 50 mg by mouth daily. Take with or immediately following a meal.     Multiple Vitamin (MULTIVITAMIN) tablet Take 1 tablet by mouth in the morning.     ONETOUCH ULTRA test strip USE 1 STRIP TO CHECK GLUCOSE ONCE DAILY     prochlorperazine (COMPAZINE) 10 MG tablet Take 1 tablet (10 mg total) by mouth every 6 (six) hours as needed for nausea or vomiting. 60 tablet 3   vitamin C (ASCORBIC ACID) 500 MG tablet Take 500 mg by mouth 2 (two) times daily.      No current facility-administered medications for this visit.    ALLERGIES:  Allergies  Allergen Reactions   Amiodarone Rash    PHYSICAL EXAM:  Performance status (ECOG): 1 - Symptomatic but completely ambulatory  There were no vitals filed for this visit. Wt Readings from Last 3 Encounters:  07/27/21 209 lb (94.8 kg)  07/12/21 205 lb 12.8 oz (93.4 kg)  06/23/21 203 lb 4.2 oz (92.2 kg)   Physical  Exam Vitals reviewed.  Constitutional:      Appearance: Normal appearance.  Cardiovascular:     Rate and Rhythm: Normal rate and regular rhythm.     Pulses: Normal pulses.     Heart sounds: Normal heart sounds.  Pulmonary:  Effort: Pulmonary effort is normal.     Breath sounds: Normal breath sounds.  Genitourinary:    Penis: Lesions (Ulcers on glans) present.   Skin:    Findings: Erythema (in creases on hands) present.  Neurological:     General: No focal deficit present.     Mental Status: He is alert and oriented to person, place, and time.     Comments: Moderate grip in hands   Psychiatric:        Mood and Affect: Mood normal.        Behavior: Behavior normal.     LABORATORY DATA:  I have reviewed the labs as listed.     Latest Ref Rng & Units 08/17/2021    9:14 AM 07/27/2021    8:43 AM 07/12/2021   12:04 PM  CBC  WBC 4.0 - 10.5 K/uL 6.7   5.6   5.8    Hemoglobin 13.0 - 17.0 g/dL 11.9   11.8   11.2    Hematocrit 39.0 - 52.0 % 35.3   36.5   34.6    Platelets 150 - 400 K/uL 184   195   175        Latest Ref Rng & Units 07/27/2021    8:43 AM 07/12/2021   12:04 PM 06/20/2021   10:47 AM  CMP  Glucose 70 - 99 mg/dL 154   111   130    BUN 8 - 23 mg/dL '16   18   18    '$ Creatinine 0.61 - 1.24 mg/dL 0.97   0.74   0.82    Sodium 135 - 145 mmol/L 140   137   138    Potassium 3.5 - 5.1 mmol/L 4.0   3.6   4.0    Chloride 98 - 111 mmol/L 107   106   106    CO2 22 - 32 mmol/L '26   24   27    '$ Calcium 8.9 - 10.3 mg/dL 9.4   9.4   9.5    Total Protein 6.5 - 8.1 g/dL 6.7   6.7   7.0    Total Bilirubin 0.3 - 1.2 mg/dL 0.6   0.5   0.5    Alkaline Phos 38 - 126 U/L 88   71   82    AST 15 - 41 U/L 30   32   21    ALT 0 - 44 U/L '27   27   21      '$ DIAGNOSTIC IMAGING:  I have independently reviewed the scans and discussed with the patient. No results found.   ASSESSMENT:  Colon cancer: - Colonoscopy on 10/12/2020 with 7 sessile polyps found in the transverse colon and cecum.  12 mm  polyp found in the transverse colon, sessile.  Polyp was removed with piecemeal technique using cold snare.  2 sessile polyps found in the sigmoid colon, removed with cold snare. - Pathology cecal tubular adenoma, sessile serrated polyp of the transverse colon polypectomy, adenocarcinoma arising from a tubular adenoma with high-grade dysplasia of the transverse colon polypectomy.  Sigmoid colon polypectomy showed tubular adenoma, hyperplastic polyp. - Colonoscopy on 02/25/2021 with three 3 to 5 mm polyps in the transverse colon, removed with cold snare.  One 10 mm polyp in the descending colon at 54 cm proximal to the anus. - Pathology on 02/25/2021 shows invasive adenocarcinoma, poorly differentiated, grade 3 of the descending colon polypectomy.  CDX2 positive.  Other findings include transverse colon tubular adenoma  and sessile serrated adenoma. - Right hemicolectomy on 05/20/2021. - Pathology shows grade 3 adenocarcinoma, mucinous features, 1.7 cm in the transverse colon, metastatic adenocarcinoma involving 2/29 lymph nodes.  No LVI/perineural invasion.  pT1, PN 1B. - Adjuvant Xeloda 1500 mg 2 weeks on/1 week off started on 07/06/2021.    Social/family history: He lives at home with his wife.  He quit smoking 23 years ago.  He smoked 1 pack/day for 7 years.  He worked in [employer] prior to retirement.  He is active and walks 2 miles per day. - Father had prostate cancer.  Brother had lung cancer.  Another brother had colon cancer (? colon), Sister with lung cancer, another sister with cancer, patient unknown.  3.  Prostate cancer: - He was diagnosed with prostate cancer in August 2007, status post seed implants.  His PSA has been undetectable since then.   PLAN:  Stage III (T1N1B) transverse colon adenocarcinoma: - He started cycle 2 of Xeloda 3 tablets twice daily 2 weeks on/1 week off on 07/27/2021. - He has noticed redness on the creases of the palms uncalled as 2 days ago.  We have held his  Xeloda.  He also had some diarrhea which was controlled with Imodium.  Labs showed normal LFTs and electrolytes.  Renal function was normal.  Last CEA was 5.4. - I would not start his Xeloda at this time.  We will give him hydrocortisone cream to be applied to the affected areas twice daily. - Reevaluate in 1 week.  If there is improvement, will consider cutting back on Xeloda to 2 tablets twice daily 2 weeks on/1 week off.  2.  Normocytic anemia: - Hemoglobin is 11.9 and stable.  Last ferritin was 78.  3.  Hand-foot skin reaction: - He had erythema in the creases of the palms. - He had developed small ulcers on the glans penis. - We we will hold Xeloda at this time.  We will consider dose reduction to 2 tablets twice daily 2 weeks on/1 week off.   Orders placed this encounter:  No orders of the defined types were placed in this encounter.    Derek Jack, MD [place] [phone]   I, Thana Ates, am acting as a scribe for Dr. Derek Jack.  I, Derek Jack MD, have reviewed the above documentation for accuracy and completeness, and I agree with the above.

## 2021-08-17 NOTE — Patient Instructions (Addendum)
[place] at [facility] Discharge Instructions   You were seen and examined today by Dr. Delton Coombes.  Hold Xeloda for now. We will restart when your hands, feet, and penis have healed.  We sent a prescription for hydrocortisone cream to your pharmacy. You may apply this to the sores on your penis twice a day.  Return as scheduled next week.      Thank you for choosing [place] at [facility] to provide your oncology and hematology care.  To afford each patient quality time with our provider, please arrive at least 15 minutes before your scheduled appointment time.   If you have a lab appointment with the [place] [facility] please come in thru the Main Entrance and check in at the main information desk.  You need to re-schedule your appointment should you arrive 10 or more minutes late.  We strive to give you quality time with our providers, and arriving late affects you and other patients whose appointments are after yours.  Also, if you no show three or more times for appointments you may be dismissed from the clinic at the providers discretion.     Again, thank you for choosing [facility].  Our hope is that these requests will decrease the amount of time that you wait before being seen by our physicians.       _____________________________________________________________  Should you have questions after your visit to [facility], please contact our office at [phone] and follow the prompts.  Our office hours are 8:00 a.m. and 4:30 p.m. Monday - Friday.  Please note that voicemails left after 4:00 p.m. may not be returned until the following business day.  We are closed weekends and major holidays.  You do have access to a nurse 24-7, just call the main number to the clinic [phone] and do not press any options, hold on the line and a nurse will answer the phone.    For prescription refill requests, have your  pharmacy contact our office and allow 72 hours.    Due to Covid, you will need to wear a mask upon entering the hospital. If you do not have a mask, a mask will be given to you at the Main Entrance upon arrival. For doctor visits, patients may have 1 support person age 71 or older with them. For treatment visits, patients can not have anyone with them due to social distancing guidelines and our immunocompromised population.

## 2021-08-23 NOTE — Progress Notes (Signed)
[name] [name], [place] [address]   CLINIC:  Medical Oncology/Hematology  PCP:  [name] Bis, MD [address] [place] [address] [phone]   REASON FOR VISIT:  Follow-up for colon cancer  PRIOR THERAPY: none  NGS Results: not done  CURRENT THERAPY: surveillance  BRIEF ONCOLOGIC HISTORY:  Oncology History   No history exists.    CANCER STAGING: Cancer Staging  Colon cancer [facility]) Staging form: Colon and Rectum, AJCC 8th Edition - Clinical stage from 03/22/2021: Stage I (cT1, cN0, cM0) - Unsigned - Pathologic stage from 06/23/2021: Stage IIIA (pT1, pN1b, cM0) - Unsigned   INTERVAL HISTORY:  [name], a 81 y.o. male, returns for routine follow-up of his colon cancer. [name] [name] was last seen on 08/17/2021.   ***  REVIEW OF SYSTEMS:  Review of Systems  All other systems reviewed and are negative.  PAST MEDICAL/SURGICAL HISTORY:  Past Medical History:  Diagnosis Date   Anemia    Arthritis    CAD (coronary artery disease)    DES to circumflex 12/2017   Diabetes mellitus without complication (HCC)    GERD (gastroesophageal reflux disease)    Glaucoma    Gout    Headache    History of kidney stones    Hypertension    NSTEMI (non-ST elevated myocardial infarction) ([place]) 12/31/2017   Paroxysmal atrial fibrillation (HCC)    Pericardial effusion    Idiopathic, recurrent pericardial effusion s/p pericardial window.   Prostate cancer ([place]) 2007   S/P Seed implants    Supraventricular tachycardia ([place])    Temporal arteritis ([place])    Past Surgical History:  Procedure Laterality Date   BIOPSY  10/12/2020   Procedure: BIOPSY;  Surgeon: [name], MD;  [facility];  Service: Gastroenterology;;   BIOPSY  02/25/2021   Procedure: BIOPSY;  Surgeon: [name], MD;  [facility];  Service: Gastroenterology;;   BIOPSY  04/11/2021   Procedure: BIOPSY;  Surgeon: Irving Copas., MD;  Location: [facility];  Service: Gastroenterology;;   CATARACT EXTRACTION W/PHACO Left 06/07/2015   Procedure: CATARACT EXTRACTION PHACO AND INTRAOCULAR LENS PLACEMENT LEFT EYE CDE=8.00;  Surgeon: Tonny Branch, MD;  Location: [facility];  Service: Ophthalmology;  Laterality: Left;   CATARACT EXTRACTION W/PHACO Right 06/17/2015   Procedure: CATARACT EXTRACTION PHACO AND INTRAOCULAR LENS PLACEMENT RIGHT EYE CDE=11.09;  Surgeon: Tonny Branch, MD;  Location: [facility];  Service: Ophthalmology;  Laterality: Right;   COLONOSCOPY WITH PROPOFOL N/A 10/12/2020   Procedure: COLONOSCOPY WITH PROPOFOL;  Surgeon: [name], MD;  [facility];  Service: Gastroenterology;  Laterality: N/A;  10:35   COLONOSCOPY WITH PROPOFOL N/A 02/25/2021   Procedure: COLONOSCOPY WITH PROPOFOL;  Surgeon: [name], MD;  [facility];  Service: Gastroenterology;  Laterality: N/A;  8:10   CORONARY ANGIOPLASTY WITH STENT PLACEMENT  01/01/2018   CORONARY STENT INTERVENTION N/A 01/01/2018   Procedure: CORONARY STENT INTERVENTION;  Surgeon: Jettie Booze, MD;  Location: [facility];  Service: Cardiovascular;  Laterality: N/A;   ENDOSCOPIC MUCOSAL RESECTION N/A 04/11/2021   Procedure: ENDOSCOPIC MUCOSAL RESECTION;  Surgeon: Rush Landmark Telford Nab., MD;  Location: [facility];  Service: Gastroenterology;  Laterality: N/A;   ENTEROSCOPY N/A 10/12/2020   Procedure: PUSH ENTEROSCOPY;  Surgeon: [name], MD;  [facility];  Service: Gastroenterology;  Laterality: N/A;   ENTEROSCOPY N/A 04/11/2021   Procedure: ENTEROSCOPY;  Surgeon: Irving Copas., MD;  Location: [facility];  Service: Gastroenterology;  Laterality: N/A;   ESOPHAGOGASTRODUODENOSCOPY (EGD) WITH PROPOFOL N/A 10/12/2020   Procedure: ESOPHAGOGASTRODUODENOSCOPY (EGD) WITH PROPOFOL;  Surgeon: [name], MD;  [facility];  Service:  Gastroenterology;  Laterality: N/A;   FRACTURE SURGERY Left 2010   ankle   HEMOSTASIS CLIP PLACEMENT  04/11/2021   Procedure: HEMOSTASIS CLIP PLACEMENT;  Surgeon: Irving Copas., MD;  Location: [facility];  Service: Gastroenterology;;   INSERTION PROSTATE RADIATION SEED  2007   KNEE ARTHROSCOPY Left    LAPAROSCOPY N/A 05/22/2021   Procedure: LAPAROSCOPY DIAGNOSTIC WITH WASHOUT OF ABDOMEN;  Surgeon: Leighton Ruff, MD;  Location: [facility];  Service: General;  Laterality: N/A;   LEFT HEART CATH AND CORONARY ANGIOGRAPHY N/A 01/01/2018   Procedure: LEFT HEART CATH AND CORONARY ANGIOGRAPHY;  Surgeon: Jettie Booze, MD;  Location: [facility];  Service: Cardiovascular;  Laterality: N/A;   ORIF TIBIA & FIBULA FRACTURES Left 2003   Distal tibial/fibula    PERICARDIAL WINDOW  02/2007   PERICARDIOCENTESIS  2007   hx/notes 10/19/2011   POLYPECTOMY  10/12/2020   Procedure: POLYPECTOMY;  Surgeon: [name], MD;  [facility];  Service: Gastroenterology;;  small bowel, cecal   POLYPECTOMY  02/25/2021   Procedure: POLYPECTOMY;  Surgeon: [name], MD;  [facility];  Service: Gastroenterology;;  transverse colon x2   SUBMUCOSAL LIFTING INJECTION  04/11/2021   Procedure: SUBMUCOSAL LIFTING INJECTION;  Surgeon: Irving Copas., MD;  Location: [facility];  Service: Gastroenterology;;   SUBMUCOSAL TATTOO INJECTION  04/11/2021   Procedure: SUBMUCOSAL TATTOO INJECTION;  Surgeon: Irving Copas., MD;  Location: [facility];  Service: Gastroenterology;;    SOCIAL HISTORY:  Social History   Socioeconomic History   Marital status: Married    Spouse name: Not on file   Number of children: Not on file   Years of education: Not on file   Highest education level: Not on file  Occupational History   Not on file  Tobacco Use   Smoking status: Former    Packs/day: 0.30    Years: 10.00    Pack years: 3.00    Types:  Cigarettes    Start date: 08/06/1958    Quit date: 1968    Years since quitting: 55.4   Smokeless tobacco: Never  Vaping Use   Vaping Use: Never used  Substance and Sexual Activity   Alcohol use: Not Currently   Drug use: Never   Sexual activity: Yes  Other Topics Concern   Not on file  Social History Narrative   Not on file   Social Determinants of Health   Financial Resource Strain: Not on file  Food Insecurity: Not on file  Transportation Needs: Not on file  Physical Activity: Not on file  Stress: Not on file  Social Connections: Not on file  Intimate Partner Violence: Not on file    FAMILY HISTORY:  Family History  Problem Relation Age of Onset   CAD Mother        MI in 40s   Prostate cancer Father    Prostate cancer Brother    Lung cancer Brother     CURRENT MEDICATIONS:  Current Outpatient Medications  Medication Sig Dispense Refill   apixaban (ELIQUIS) 5 MG TABS tablet Take 1 tablet (5 mg total) by mouth 2 (two) times daily. 60 tablet 6   atorvastatin (LIPITOR) 80 MG tablet TAKE 1 TABLET  BY MOUTH ONCE DAILY AT  6  PM 90 tablet 0   benazepril (LOTENSIN) 40 MG tablet TAKE 1 TABLET BY MOUTH ONCE DAILY** NEEDS  OFFICE  VISIT 90 tablet 1   capecitabine (XELODA) 500 MG tablet Take 3 tablets (1,500 mg total) by mouth 2 (two) times daily after a meal. Take 3 tablets 2 times a day for 21 days and off for 7 days.  Take with food. 126 tablet 0   diltiazem (CARDIZEM CD) 180 MG 24 hr capsule Take 1 capsule (180 mg total) by mouth daily. 30 capsule 1   dorzolamide (TRUSOPT) 2 % ophthalmic solution Place 1 drop into both eyes 2 (two) times daily.     febuxostat (ULORIC) 40 MG tablet Take 1 tablet (40 mg total) by mouth daily. 30 tablet 6   FERREX 150 150 MG capsule Take 150 mg by mouth 2 (two) times daily.     hydrALAZINE (APRESOLINE) 100 MG tablet Take 100 mg by mouth 2 (two) times daily.     hydrochlorothiazide (HYDRODIURIL) 12.5 MG tablet Take 12.5 mg by mouth daily.      hydrocortisone 1 % lotion Apply 1 application. topically 2 (two) times daily. 118 mL 3   latanoprost (XALATAN) 0.005 % ophthalmic solution Place 1 drop into both eyes at bedtime.     metFORMIN (GLUCOPHAGE-XR) 500 MG 24 hr tablet Take 500 mg by mouth 2 (two) times daily.     metoprolol succinate (TOPROL-XL) 50 MG 24 hr tablet Take 50 mg by mouth daily. Take with or immediately following a meal.     Multiple Vitamin (MULTIVITAMIN) tablet Take 1 tablet by mouth in the morning.     ONETOUCH ULTRA test strip USE 1 STRIP TO CHECK GLUCOSE ONCE DAILY     prochlorperazine (COMPAZINE) 10 MG tablet Take 1 tablet (10 mg total) by mouth every 6 (six) hours as needed for nausea or vomiting. 60 tablet 3   vitamin C (ASCORBIC ACID) 500 MG tablet Take 500 mg by mouth 2 (two) times daily.      No current facility-administered medications for this visit.    ALLERGIES:  Allergies  Allergen Reactions   Amiodarone Rash    PHYSICAL EXAM:  Performance status (ECOG): 1 - Symptomatic but completely ambulatory  There were no vitals filed for this visit. Wt Readings from Last 3 Encounters:  08/17/21 205 lb (93 kg)  07/27/21 209 lb (94.8 kg)  07/12/21 205 lb 12.8 oz (93.4 kg)   Physical Exam   LABORATORY DATA:  I have reviewed the labs as listed.     Latest Ref Rng & Units 08/17/2021    9:14 AM 07/27/2021    8:43 AM 07/12/2021   12:04 PM  CBC  WBC 4.0 - 10.5 K/uL 6.7   5.6   5.8    Hemoglobin 13.0 - 17.0 g/dL 11.9   11.8   11.2    Hematocrit 39.0 - 52.0 % 35.3   36.5   34.6    Platelets 150 - 400 K/uL 184   195   175        Latest Ref Rng & Units 08/17/2021    9:14 AM 07/27/2021    8:43 AM 07/12/2021   12:04 PM  CMP  Glucose 70 - 99 mg/dL 186   154   111    BUN 8 - 23 mg/dL '17   16   18    '$ Creatinine 0.61 - 1.24 mg/dL 0.88   0.97   0.74  Sodium 135 - 145 mmol/L 136   140   137    Potassium 3.5 - 5.1 mmol/L 3.9   4.0   3.6    Chloride 98 - 111 mmol/L 106   107   106    CO2 22 - 32 mmol/L '26   26    24    '$ Calcium 8.9 - 10.3 mg/dL 9.4   9.4   9.4    Total Protein 6.5 - 8.1 g/dL 6.5   6.7   6.7    Total Bilirubin 0.3 - 1.2 mg/dL 0.4   0.6   0.5    Alkaline Phos 38 - 126 U/L 80   88   71    AST 15 - 41 U/L 25   30   32    ALT 0 - 44 U/L '22   27   27      '$ DIAGNOSTIC IMAGING:  I have independently reviewed the scans and discussed with the patient. No results found.   ASSESSMENT:  Colon cancer: - Colonoscopy on 10/12/2020 with 7 sessile polyps found in the transverse colon and cecum.  12 mm polyp found in the transverse colon, sessile.  Polyp was removed with piecemeal technique using cold snare.  2 sessile polyps found in the sigmoid colon, removed with cold snare. - Pathology cecal tubular adenoma, sessile serrated polyp of the transverse colon polypectomy, adenocarcinoma arising from a tubular adenoma with high-grade dysplasia of the transverse colon polypectomy.  Sigmoid colon polypectomy showed tubular adenoma, hyperplastic polyp. - Colonoscopy on 02/25/2021 with three 3 to 5 mm polyps in the transverse colon, removed with cold snare.  One 10 mm polyp in the descending colon at 54 cm proximal to the anus. - Pathology on 02/25/2021 shows invasive adenocarcinoma, poorly differentiated, grade 3 of the descending colon polypectomy.  CDX2 positive.  Other findings include transverse colon tubular adenoma and sessile serrated adenoma. - Right hemicolectomy on 05/20/2021. - Pathology shows grade 3 adenocarcinoma, mucinous features, 1.7 cm in the transverse colon, metastatic adenocarcinoma involving 2/29 lymph nodes.  No LVI/perineural invasion.  pT1, PN 1B. - Adjuvant Xeloda 1500 mg 2 weeks on/1 week off started on 07/06/2021.    Social/family history: He lives at home with his wife.  He quit smoking 23 years ago.  He smoked 1 pack/day for 7 years.  He worked in [place] prior to retirement.  He is active and walks 2 miles per day. - Father had prostate cancer.  Brother had lung cancer.  Another  brother had colon cancer (? colon), Sister with lung cancer, another sister with cancer, patient unknown.  3.  Prostate cancer: - He was diagnosed with prostate cancer in August 2007, status post seed implants.  His PSA has been undetectable since then.   PLAN:  Stage III (T1N1B) transverse colon adenocarcinoma: - He started cycle 2 of Xeloda 3 tablets twice daily 2 weeks on/1 week off on 07/27/2021. - He has noticed redness on the creases of the palms uncalled as 2 days ago.  We have held his Xeloda.  He also had some diarrhea which was controlled with Imodium.  Labs showed normal LFTs and electrolytes.  Renal function was normal.  Last CEA was 5.4. - I would not start his Xeloda at this time.  We will give him hydrocortisone cream to be applied to the affected areas twice daily. - Reevaluate in 1 week.  If there is improvement, will consider cutting back on Xeloda to 2 tablets twice  daily 2 weeks on/1 week off.  2.  Normocytic anemia: - Hemoglobin is 11.9 and stable.  Last ferritin was 78.  3.  Hand-foot skin reaction: - He had erythema in the creases of the palms. - He had developed small ulcers on the glans penis. - We we will hold Xeloda at this time.  We will consider dose reduction to 2 tablets twice daily 2 weeks on/1 week off.   Orders placed this encounter:  No orders of the defined types were placed in this encounter.    Derek Jack, MD [facility] [phone]   I, Thana Ates, am acting as a scribe for Dr. Derek Jack.  {Add Barista Statement}

## 2021-08-25 ENCOUNTER — Inpatient Hospital Stay (HOSPITAL_COMMUNITY): Payer: Medicare HMO | Attending: Hematology | Admitting: Hematology

## 2021-08-25 ENCOUNTER — Inpatient Hospital Stay (HOSPITAL_COMMUNITY): Payer: Medicare HMO

## 2021-08-25 ENCOUNTER — Other Ambulatory Visit (HOSPITAL_COMMUNITY): Payer: Self-pay

## 2021-08-25 VITALS — BP 133/67 | HR 56 | Temp 97.7°F | Resp 16 | Wt 208.7 lb

## 2021-08-25 DIAGNOSIS — R200 Anesthesia of skin: Secondary | ICD-10-CM | POA: Insufficient documentation

## 2021-08-25 DIAGNOSIS — D125 Benign neoplasm of sigmoid colon: Secondary | ICD-10-CM | POA: Insufficient documentation

## 2021-08-25 DIAGNOSIS — I2510 Atherosclerotic heart disease of native coronary artery without angina pectoris: Secondary | ICD-10-CM | POA: Insufficient documentation

## 2021-08-25 DIAGNOSIS — Z8249 Family history of ischemic heart disease and other diseases of the circulatory system: Secondary | ICD-10-CM | POA: Diagnosis not present

## 2021-08-25 DIAGNOSIS — Z8719 Personal history of other diseases of the digestive system: Secondary | ICD-10-CM | POA: Diagnosis not present

## 2021-08-25 DIAGNOSIS — Z8042 Family history of malignant neoplasm of prostate: Secondary | ICD-10-CM | POA: Insufficient documentation

## 2021-08-25 DIAGNOSIS — Z8546 Personal history of malignant neoplasm of prostate: Secondary | ICD-10-CM | POA: Insufficient documentation

## 2021-08-25 DIAGNOSIS — Z79899 Other long term (current) drug therapy: Secondary | ICD-10-CM | POA: Diagnosis not present

## 2021-08-25 DIAGNOSIS — K5900 Constipation, unspecified: Secondary | ICD-10-CM | POA: Insufficient documentation

## 2021-08-25 DIAGNOSIS — Z801 Family history of malignant neoplasm of trachea, bronchus and lung: Secondary | ICD-10-CM | POA: Insufficient documentation

## 2021-08-25 DIAGNOSIS — E119 Type 2 diabetes mellitus without complications: Secondary | ICD-10-CM | POA: Insufficient documentation

## 2021-08-25 DIAGNOSIS — Z87891 Personal history of nicotine dependence: Secondary | ICD-10-CM | POA: Diagnosis not present

## 2021-08-25 DIAGNOSIS — I252 Old myocardial infarction: Secondary | ICD-10-CM | POA: Diagnosis not present

## 2021-08-25 DIAGNOSIS — I480 Paroxysmal atrial fibrillation: Secondary | ICD-10-CM | POA: Insufficient documentation

## 2021-08-25 DIAGNOSIS — D509 Iron deficiency anemia, unspecified: Secondary | ICD-10-CM | POA: Diagnosis not present

## 2021-08-25 DIAGNOSIS — C184 Malignant neoplasm of transverse colon: Secondary | ICD-10-CM | POA: Diagnosis not present

## 2021-08-25 DIAGNOSIS — Z87442 Personal history of urinary calculi: Secondary | ICD-10-CM | POA: Diagnosis not present

## 2021-08-25 DIAGNOSIS — I10 Essential (primary) hypertension: Secondary | ICD-10-CM | POA: Insufficient documentation

## 2021-08-25 DIAGNOSIS — C180 Malignant neoplasm of cecum: Secondary | ICD-10-CM | POA: Diagnosis not present

## 2021-08-25 DIAGNOSIS — Z7901 Long term (current) use of anticoagulants: Secondary | ICD-10-CM | POA: Insufficient documentation

## 2021-08-25 DIAGNOSIS — D649 Anemia, unspecified: Secondary | ICD-10-CM | POA: Insufficient documentation

## 2021-08-25 LAB — COMPREHENSIVE METABOLIC PANEL
ALT: 23 U/L (ref 0–44)
AST: 27 U/L (ref 15–41)
Albumin: 3.8 g/dL (ref 3.5–5.0)
Alkaline Phosphatase: 78 U/L (ref 38–126)
Anion gap: 4 — ABNORMAL LOW (ref 5–15)
BUN: 20 mg/dL (ref 8–23)
CO2: 27 mmol/L (ref 22–32)
Calcium: 9.3 mg/dL (ref 8.9–10.3)
Chloride: 107 mmol/L (ref 98–111)
Creatinine, Ser: 0.87 mg/dL (ref 0.61–1.24)
GFR, Estimated: >60 mL/min (ref >60)
Glucose, Bld: 233 mg/dL — ABNORMAL HIGH (ref 70–99)
Potassium: 3.9 mmol/L (ref 3.5–5.1)
Sodium: 138 mmol/L (ref 135–145)
Total Bilirubin: 0.5 mg/dL (ref 0.3–1.2)
Total Protein: 6.3 g/dL — ABNORMAL LOW (ref 6.5–8.1)

## 2021-08-25 LAB — CBC WITH DIFFERENTIAL/PLATELET
Abs Immature Granulocytes: 0.03 10*3/uL (ref 0.00–0.07)
Basophils Absolute: 0.1 10*3/uL (ref 0.0–0.1)
Basophils Relative: 1 %
Eosinophils Absolute: 0.1 10*3/uL (ref 0.0–0.5)
Eosinophils Relative: 2 %
HCT: 34.6 % — ABNORMAL LOW (ref 39.0–52.0)
Hemoglobin: 11.4 g/dL — ABNORMAL LOW (ref 13.0–17.0)
Immature Granulocytes: 1 %
Lymphocytes Relative: 19 %
Lymphs Abs: 1.1 10*3/uL (ref 0.7–4.0)
MCH: 32.1 pg (ref 26.0–34.0)
MCHC: 32.9 g/dL (ref 30.0–36.0)
MCV: 97.5 fL (ref 80.0–100.0)
Monocytes Absolute: 0.5 10*3/uL (ref 0.1–1.0)
Monocytes Relative: 9 %
Neutro Abs: 4.1 10*3/uL (ref 1.7–7.7)
Neutrophils Relative %: 68 %
Platelets: 170 10*3/uL (ref 150–400)
RBC: 3.55 MIL/uL — ABNORMAL LOW (ref 4.22–5.81)
RDW: 17.4 % — ABNORMAL HIGH (ref 11.5–15.5)
WBC: 5.9 10*3/uL (ref 4.0–10.5)
nRBC: 0 % (ref 0.0–0.2)

## 2021-08-25 LAB — MAGNESIUM: Magnesium: 2.1 mg/dL (ref 1.7–2.4)

## 2021-08-25 MED ORDER — UREA 10 % EX CREA: TOPICAL_CREAM | Freq: Two times a day (BID) | CUTANEOUS | 2 refills | Status: DC

## 2021-08-25 NOTE — Progress Notes (Signed)
Patient is not taking Xeloda as prescribed.  HE has not had any doses in 2 weeks and reports both hands are peeling as side effects at this time.

## 2021-08-25 NOTE — Patient Instructions (Signed)
[name] at [facility] Discharge Instructions   You were seen and examined today by Dr. Delton Coombes.  He reviewed the results of your lab work which is normal/stable.   We sent a prescription for urea cream to your pharmacy. Apply to hands twice a day.  Restart Xeloda 2 pills twice a day 2 weeks on and 1 week off.   Return as scheduled in 3 weeks.    Thank you for choosing [place] at [facility] to provide your oncology and hematology care.  To afford each patient quality time with our provider, please arrive at least 15 minutes before your scheduled appointment time.   If you have a lab appointment with the [place] please come in thru the Main Entrance and check in at the main information desk.  You need to re-schedule your appointment should you arrive 10 or more minutes late.  We strive to give you quality time with our providers, and arriving late affects you and other patients whose appointments are after yours.  Also, if you no show three or more times for appointments you may be dismissed from the clinic at the providers discretion.     Again, thank you for choosing [facility].  Our hope is that these requests will decrease the amount of time that you wait before being seen by our physicians.       _____________________________________________________________  Should you have questions after your visit to [facility], please contact our office at [phone] and follow the prompts.  Our office hours are 8:00 a.m. and 4:30 p.m. Monday - Friday.  Please note that voicemails left after 4:00 p.m. may not be returned until the following business day.  We are closed weekends and major holidays.  You do have access to a nurse 24-7, just call the main number to the clinic [phone] and do not press any options, hold on the line and a nurse will answer the phone.    For prescription refill requests, have  your pharmacy contact our office and allow 72 hours.    Due to Covid, you will need to wear a mask upon entering the hospital. If you do not have a mask, a mask will be given to you at the Main Entrance upon arrival. For doctor visits, patients may have 1 support person age 60 or older with them. For treatment visits, patients can not have anyone with them due to social distancing guidelines and our immunocompromised population.

## 2021-08-29 ENCOUNTER — Telehealth (HOSPITAL_COMMUNITY): Payer: Self-pay | Admitting: *Deleted

## 2021-08-29 ENCOUNTER — Other Ambulatory Visit (HOSPITAL_COMMUNITY): Payer: Self-pay

## 2021-08-29 NOTE — Telephone Encounter (Signed)
Called wife to follow up on after hours call regarding peeling and infected appearing toe.  Started Augmentin on 6/3 and states that it is looking better.  Advised to keep clean and dry and can apply antibiotic cream.  Advised to notify us if not significantly improved by Thursday.

## 2021-08-30 ENCOUNTER — Other Ambulatory Visit (HOSPITAL_COMMUNITY): Payer: Self-pay

## 2021-08-30 DIAGNOSIS — C184 Malignant neoplasm of transverse colon: Secondary | ICD-10-CM

## 2021-08-30 MED ORDER — CAPECITABINE 500 MG PO TABS
1500.0000 mg | ORAL_TABLET | Freq: Two times a day (BID) | ORAL | 0 refills | Status: DC
  Filled 2021-08-30: qty 16, 3d supply, fill #0

## 2021-08-30 NOTE — Telephone Encounter (Signed)
3 day Xeloda refill sent per Dr. Delton Coombes to complete medication cycle.

## 2021-08-31 ENCOUNTER — Other Ambulatory Visit (HOSPITAL_COMMUNITY): Payer: Self-pay

## 2021-09-01 ENCOUNTER — Other Ambulatory Visit (HOSPITAL_COMMUNITY): Payer: Self-pay

## 2021-09-01 DIAGNOSIS — E7849 Other hyperlipidemia: Secondary | ICD-10-CM | POA: Diagnosis not present

## 2021-09-01 DIAGNOSIS — I4891 Unspecified atrial fibrillation: Secondary | ICD-10-CM | POA: Diagnosis not present

## 2021-09-01 DIAGNOSIS — E1169 Type 2 diabetes mellitus with other specified complication: Secondary | ICD-10-CM | POA: Diagnosis not present

## 2021-09-01 DIAGNOSIS — Z6828 Body mass index (BMI) 28.0-28.9, adult: Secondary | ICD-10-CM | POA: Diagnosis not present

## 2021-09-01 DIAGNOSIS — G2581 Restless legs syndrome: Secondary | ICD-10-CM | POA: Diagnosis not present

## 2021-09-01 DIAGNOSIS — M1000 Idiopathic gout, unspecified site: Secondary | ICD-10-CM | POA: Diagnosis not present

## 2021-09-01 DIAGNOSIS — D509 Iron deficiency anemia, unspecified: Secondary | ICD-10-CM | POA: Diagnosis not present

## 2021-09-01 DIAGNOSIS — E11621 Type 2 diabetes mellitus with foot ulcer: Secondary | ICD-10-CM | POA: Diagnosis not present

## 2021-09-01 DIAGNOSIS — I10 Essential (primary) hypertension: Secondary | ICD-10-CM | POA: Diagnosis not present

## 2021-09-01 DIAGNOSIS — L97509 Non-pressure chronic ulcer of other part of unspecified foot with unspecified severity: Secondary | ICD-10-CM | POA: Diagnosis not present

## 2021-09-01 DIAGNOSIS — C189 Malignant neoplasm of colon, unspecified: Secondary | ICD-10-CM | POA: Diagnosis not present

## 2021-09-01 DIAGNOSIS — D692 Other nonthrombocytopenic purpura: Secondary | ICD-10-CM | POA: Diagnosis not present

## 2021-09-13 ENCOUNTER — Other Ambulatory Visit (HOSPITAL_COMMUNITY): Payer: Self-pay | Admitting: *Deleted

## 2021-09-13 ENCOUNTER — Other Ambulatory Visit (HOSPITAL_COMMUNITY): Payer: Self-pay

## 2021-09-13 ENCOUNTER — Telehealth (HOSPITAL_COMMUNITY): Payer: Self-pay | Admitting: *Deleted

## 2021-09-13 DIAGNOSIS — C184 Malignant neoplasm of transverse colon: Secondary | ICD-10-CM

## 2021-09-13 MED ORDER — CAPECITABINE 500 MG PO TABS
1500.0000 mg | ORAL_TABLET | Freq: Two times a day (BID) | ORAL | 0 refills | Status: DC
  Filled 2021-09-13 (×2): qty 126, 21d supply, fill #0

## 2021-09-13 MED ORDER — AMOXICILLIN-POT CLAVULANATE 875-125 MG PO TABS: 1.0000 | ORAL_TABLET | Freq: Two times a day (BID) | ORAL | 0 refills | Status: AC

## 2021-09-13 NOTE — Telephone Encounter (Signed)
Xeloda refilled per Dr. Delton Coombes

## 2021-09-13 NOTE — Telephone Encounter (Signed)
Received call from wife stating that left foot has healed well since finishing Augmentin, however, has developed what appears to be an infection in right great toe.  Described at being red and painful with white exudate.  Denies fever.  Per Dr. Delton Coombes will call in another round of Augmentin and assess at visit on Thursday.  Wife verbalized understanding.

## 2021-09-13 NOTE — Progress Notes (Signed)
See note

## 2021-09-14 ENCOUNTER — Other Ambulatory Visit (HOSPITAL_COMMUNITY): Payer: Self-pay

## 2021-09-15 ENCOUNTER — Inpatient Hospital Stay (HOSPITAL_COMMUNITY): Payer: Medicare HMO | Admitting: Hematology

## 2021-09-15 ENCOUNTER — Inpatient Hospital Stay (HOSPITAL_COMMUNITY): Payer: Medicare HMO

## 2021-09-15 ENCOUNTER — Inpatient Hospital Stay (HOSPITAL_BASED_OUTPATIENT_CLINIC_OR_DEPARTMENT_OTHER): Payer: Medicare HMO | Admitting: Hematology

## 2021-09-15 VITALS — BP 150/65 | HR 71 | Temp 98.0°F | Resp 18 | Ht 70.0 in | Wt 209.6 lb

## 2021-09-15 DIAGNOSIS — Z8546 Personal history of malignant neoplasm of prostate: Secondary | ICD-10-CM | POA: Diagnosis not present

## 2021-09-15 DIAGNOSIS — I480 Paroxysmal atrial fibrillation: Secondary | ICD-10-CM | POA: Diagnosis not present

## 2021-09-15 DIAGNOSIS — E119 Type 2 diabetes mellitus without complications: Secondary | ICD-10-CM | POA: Diagnosis not present

## 2021-09-15 DIAGNOSIS — Z87442 Personal history of urinary calculi: Secondary | ICD-10-CM | POA: Diagnosis not present

## 2021-09-15 DIAGNOSIS — I2510 Atherosclerotic heart disease of native coronary artery without angina pectoris: Secondary | ICD-10-CM | POA: Diagnosis not present

## 2021-09-15 DIAGNOSIS — D649 Anemia, unspecified: Secondary | ICD-10-CM | POA: Diagnosis not present

## 2021-09-15 DIAGNOSIS — K5900 Constipation, unspecified: Secondary | ICD-10-CM | POA: Diagnosis not present

## 2021-09-15 DIAGNOSIS — D509 Iron deficiency anemia, unspecified: Secondary | ICD-10-CM

## 2021-09-15 DIAGNOSIS — C180 Malignant neoplasm of cecum: Secondary | ICD-10-CM | POA: Diagnosis not present

## 2021-09-15 DIAGNOSIS — Z8719 Personal history of other diseases of the digestive system: Secondary | ICD-10-CM | POA: Diagnosis not present

## 2021-09-15 DIAGNOSIS — Z79899 Other long term (current) drug therapy: Secondary | ICD-10-CM | POA: Diagnosis not present

## 2021-09-15 DIAGNOSIS — C184 Malignant neoplasm of transverse colon: Secondary | ICD-10-CM | POA: Diagnosis not present

## 2021-09-15 DIAGNOSIS — D125 Benign neoplasm of sigmoid colon: Secondary | ICD-10-CM | POA: Diagnosis not present

## 2021-09-15 DIAGNOSIS — Z8042 Family history of malignant neoplasm of prostate: Secondary | ICD-10-CM | POA: Diagnosis not present

## 2021-09-15 DIAGNOSIS — Z87891 Personal history of nicotine dependence: Secondary | ICD-10-CM | POA: Diagnosis not present

## 2021-09-15 DIAGNOSIS — R200 Anesthesia of skin: Secondary | ICD-10-CM | POA: Diagnosis not present

## 2021-09-15 DIAGNOSIS — Z8249 Family history of ischemic heart disease and other diseases of the circulatory system: Secondary | ICD-10-CM | POA: Diagnosis not present

## 2021-09-15 DIAGNOSIS — I10 Essential (primary) hypertension: Secondary | ICD-10-CM | POA: Diagnosis not present

## 2021-09-15 DIAGNOSIS — Z801 Family history of malignant neoplasm of trachea, bronchus and lung: Secondary | ICD-10-CM | POA: Diagnosis not present

## 2021-09-15 DIAGNOSIS — Z7901 Long term (current) use of anticoagulants: Secondary | ICD-10-CM | POA: Diagnosis not present

## 2021-09-15 DIAGNOSIS — I252 Old myocardial infarction: Secondary | ICD-10-CM | POA: Diagnosis not present

## 2021-09-15 LAB — CBC WITH DIFFERENTIAL/PLATELET
Abs Immature Granulocytes: 0.03 10*3/uL (ref 0.00–0.07)
Basophils Absolute: 0 10*3/uL (ref 0.0–0.1)
Basophils Relative: 1 %
Eosinophils Absolute: 0.1 10*3/uL (ref 0.0–0.5)
Eosinophils Relative: 2 %
HCT: 36 % — ABNORMAL LOW (ref 39.0–52.0)
Hemoglobin: 12.3 g/dL — ABNORMAL LOW (ref 13.0–17.0)
Immature Granulocytes: 1 %
Lymphocytes Relative: 15 %
Lymphs Abs: 0.9 10*3/uL (ref 0.7–4.0)
MCH: 33.6 pg (ref 26.0–34.0)
MCHC: 34.2 g/dL (ref 30.0–36.0)
MCV: 98.4 fL (ref 80.0–100.0)
Monocytes Absolute: 0.6 10*3/uL (ref 0.1–1.0)
Monocytes Relative: 9 %
Neutro Abs: 4.4 10*3/uL (ref 1.7–7.7)
Neutrophils Relative %: 72 %
Platelets: 175 10*3/uL (ref 150–400)
RBC: 3.66 MIL/uL — ABNORMAL LOW (ref 4.22–5.81)
RDW: 18.1 % — ABNORMAL HIGH (ref 11.5–15.5)
WBC: 6 10*3/uL (ref 4.0–10.5)
nRBC: 0 % (ref 0.0–0.2)

## 2021-09-15 LAB — COMPREHENSIVE METABOLIC PANEL
ALT: 22 U/L (ref 0–44)
AST: 28 U/L (ref 15–41)
Albumin: 4 g/dL (ref 3.5–5.0)
Alkaline Phosphatase: 78 U/L (ref 38–126)
Anion gap: 7 (ref 5–15)
BUN: 14 mg/dL (ref 8–23)
CO2: 24 mmol/L (ref 22–32)
Calcium: 9.5 mg/dL (ref 8.9–10.3)
Chloride: 106 mmol/L (ref 98–111)
Creatinine, Ser: 0.78 mg/dL (ref 0.61–1.24)
GFR, Estimated: >60 mL/min (ref >60)
Glucose, Bld: 138 mg/dL — ABNORMAL HIGH (ref 70–99)
Potassium: 3.8 mmol/L (ref 3.5–5.1)
Sodium: 137 mmol/L (ref 135–145)
Total Bilirubin: 0.5 mg/dL (ref 0.3–1.2)
Total Protein: 7.1 g/dL (ref 6.5–8.1)

## 2021-09-15 LAB — MAGNESIUM: Magnesium: 2 mg/dL (ref 1.7–2.4)

## 2021-09-15 NOTE — Progress Notes (Signed)
[name] [name], [place] [address]   CLINIC:  Medical Oncology/Hematology  PCP:  Caryl Bis, MD [address] [place] [address] [phone]   REASON FOR VISIT:  Follow-up for colon cancer  PRIOR THERAPY: none  NGS Results: not done  CURRENT THERAPY: surveillance  BRIEF ONCOLOGIC HISTORY:  Oncology History   No history exists.    CANCER STAGING: Cancer Staging  Colon cancer [facility]) Staging form: Colon and Rectum, AJCC 8th Edition - Clinical stage from 03/22/2021: Stage I (cT1, cN0, cM0) - Unsigned - Pathologic stage from 06/23/2021: Stage IIIA (pT1, pN1b, cM0) - Unsigned   INTERVAL HISTORY:  [name] [name], a 81 y.o. male, returns for routine follow-up of his colon cancer. [name] [name] was last seen on 08/25/2021.   Today he reports feeling well. He reports his right great toe nail has improved, but continues to have discharge from under the nail. His left great toe nail has improved, and his left foot continues to have skin peeling. He started Augmentin on Tuesday. His rash has resolved. He denies mouth sores and skin peeling on his palms. He denies nausea and vomiting. He reports mild diarrhea last week which resolved. He is eating well. He denies fatigue. He reports he walks 2 miles daily when weather allows.   REVIEW OF SYSTEMS:  Review of Systems  Constitutional:  Negative for appetite change and fatigue.  HENT:   Negative for mouth sores.   Respiratory:  Positive for cough.   Cardiovascular:  Positive for palpitations.  Gastrointestinal:  Negative for diarrhea (resolved), nausea and vomiting.  Neurological:  Positive for dizziness and numbness.  All other systems reviewed and are negative.   PAST MEDICAL/SURGICAL HISTORY:  Past Medical History:  Diagnosis Date   Anemia    Arthritis    CAD (coronary artery disease)    DES to circumflex 12/2017   Diabetes mellitus without complication (HCC)    GERD (gastroesophageal reflux  disease)    Glaucoma    Gout    Headache    History of kidney stones    Hypertension    NSTEMI (non-ST elevated myocardial infarction) ([place]) 12/31/2017   Paroxysmal atrial fibrillation (HCC)    Pericardial effusion    Idiopathic, recurrent pericardial effusion s/p pericardial window.   Prostate cancer ([place]) 2007   S/P Seed implants    Supraventricular tachycardia ([place])    Temporal arteritis ([place])    Past Surgical History:  Procedure Laterality Date   BIOPSY  10/12/2020   Procedure: BIOPSY;  Surgeon: [name], MD;  [facility];  Service: Gastroenterology;;   BIOPSY  02/25/2021   Procedure: BIOPSY;  Surgeon: [name], MD;  [facility];  Service: Gastroenterology;;   BIOPSY  04/11/2021   Procedure: BIOPSY;  Surgeon: Irving Copas., MD;  Location: [facility];  Service: Gastroenterology;;   CATARACT EXTRACTION W/PHACO Left 06/07/2015   Procedure: CATARACT EXTRACTION PHACO AND INTRAOCULAR LENS PLACEMENT LEFT EYE CDE=8.00;  Surgeon: Tonny Branch, MD;  Location: [facility];  Service: Ophthalmology;  Laterality: Left;   CATARACT EXTRACTION W/PHACO Right 06/17/2015   Procedure: CATARACT EXTRACTION PHACO AND INTRAOCULAR LENS PLACEMENT RIGHT EYE CDE=11.09;  Surgeon: Tonny Branch, MD;  Location: [facility];  Service: Ophthalmology;  Laterality: Right;   COLONOSCOPY WITH PROPOFOL N/A 10/12/2020   Procedure: COLONOSCOPY WITH PROPOFOL;  Surgeon: [name], MD;  [facility];  Service: Gastroenterology;  Laterality: N/A;  10:35  COLONOSCOPY WITH PROPOFOL N/A 02/25/2021   Procedure: COLONOSCOPY WITH PROPOFOL;  Surgeon: [name], MD;  [facility];  Service: Gastroenterology;  Laterality: N/A;  8:10   CORONARY ANGIOPLASTY WITH STENT PLACEMENT  01/01/2018   CORONARY STENT INTERVENTION N/A 01/01/2018   Procedure: CORONARY STENT INTERVENTION;  Surgeon: Jettie Booze, MD;  Location: [facility];  Service: Cardiovascular;  Laterality: N/A;   ENDOSCOPIC MUCOSAL RESECTION N/A 04/11/2021   Procedure: ENDOSCOPIC MUCOSAL RESECTION;  Surgeon: Rush Landmark Telford Nab., MD;  [facility];  Service: Gastroenterology;  Laterality: N/A;   ENTEROSCOPY N/A 10/12/2020   Procedure: PUSH ENTEROSCOPY;  Surgeon: [name], MD;  [facility];  Service: Gastroenterology;  Laterality: N/A;   ENTEROSCOPY N/A 04/11/2021   Procedure: ENTEROSCOPY;  Surgeon: Rush Landmark Telford Nab., MD;  Location: [facility];  Service: Gastroenterology;  Laterality: N/A;   ESOPHAGOGASTRODUODENOSCOPY (EGD) WITH PROPOFOL N/A 10/12/2020   Procedure: ESOPHAGOGASTRODUODENOSCOPY (EGD) WITH PROPOFOL;  Surgeon: [name], MD;  [facility];  Service: Gastroenterology;  Laterality: N/A;   FRACTURE SURGERY Left 2010   ankle   HEMOSTASIS CLIP PLACEMENT  04/11/2021   Procedure: HEMOSTASIS CLIP PLACEMENT;  Surgeon: Irving Copas., MD;  [facility];  Service: Gastroenterology;;   INSERTION PROSTATE RADIATION SEED  2007   KNEE ARTHROSCOPY Left    LAPAROSCOPY N/A 05/22/2021   Procedure: LAPAROSCOPY DIAGNOSTIC WITH WASHOUT OF ABDOMEN;  Surgeon: Leighton Ruff, MD;  Location: [facility];  Service: General;  Laterality: N/A;   LEFT HEART CATH AND CORONARY ANGIOGRAPHY N/A 01/01/2018   Procedure: LEFT HEART CATH AND CORONARY ANGIOGRAPHY;  Surgeon: Jettie Booze, MD;  Location: [facility];  Service: Cardiovascular;  Laterality: N/A;   ORIF TIBIA & FIBULA FRACTURES Left 2003   Distal tibial/fibula    PERICARDIAL WINDOW  02/2007   PERICARDIOCENTESIS  2007   hx/notes 10/19/2011   POLYPECTOMY  10/12/2020   Procedure: POLYPECTOMY;  Surgeon: [name], MD;  [facility];  Service: Gastroenterology;;  small bowel, cecal   POLYPECTOMY  02/25/2021   Procedure: POLYPECTOMY;  Surgeon: [name], MD;  [facility];  Service: Gastroenterology;;  transverse colon x2   SUBMUCOSAL LIFTING INJECTION  04/11/2021   Procedure: SUBMUCOSAL LIFTING INJECTION;  Surgeon: Irving Copas., MD;  Location: [facility];  Service: Gastroenterology;;   SUBMUCOSAL TATTOO INJECTION  04/11/2021   Procedure: SUBMUCOSAL TATTOO INJECTION;  Surgeon: Irving Copas., MD;  Location: [facility];  Service: Gastroenterology;;    SOCIAL HISTORY:  Social History   Socioeconomic History   Marital status: Married    Spouse name: Not on file   Number of children: Not on file   Years of education: Not on file   Highest education level: Not on file  Occupational History   Not on file  Tobacco Use   Smoking status: Former    Packs/day: 0.30    Years: 10.00    Total pack years: 3.00    Types: Cigarettes    Start date: 08/06/1958    Quit date: 1968    Years since quitting: 55.5   Smokeless tobacco: Never  Vaping Use   Vaping Use: Never used  Substance and Sexual Activity   Alcohol use: Not Currently   Drug use: Never   Sexual activity: Yes  Other Topics Concern   Not on file  Social History Narrative   Not on file   Social Determinants of Health  Financial Resource Strain: Not on file  Food Insecurity: Not on file  Transportation Needs: Not on file  Physical Activity: Not on file  Stress: Not on file  Social Connections: Not on file  Intimate Partner Violence: Not on file    FAMILY HISTORY:  Family History  Problem Relation Age of Onset   CAD Mother        MI in 81s   Prostate cancer Father    Prostate cancer Brother    Lung cancer Brother     CURRENT MEDICATIONS:  Current Outpatient Medications  Medication Sig Dispense Refill   amoxicillin-clavulanate (AUGMENTIN) 875-125 MG tablet Take 1 tablet by mouth 2 (two) times daily for 7 days. 14 tablet 0   apixaban (ELIQUIS) 5 MG TABS tablet Take 1 tablet (5 mg total) by mouth 2 (two) times daily. 60 tablet 6   atorvastatin  (LIPITOR) 80 MG tablet TAKE 1 TABLET BY MOUTH ONCE DAILY AT  6  PM 90 tablet 0   benazepril (LOTENSIN) 40 MG tablet TAKE 1 TABLET BY MOUTH ONCE DAILY** NEEDS  OFFICE  VISIT 90 tablet 1   capecitabine (XELODA) 500 MG tablet Take 3 tablets (1,500 mg total) by mouth 2 (two) times daily after a meal. Take 3 tablets 2 times a day for 21 days and off for 7 days.  Take with food. 126 tablet 0   diltiazem (CARDIZEM CD) 180 MG 24 hr capsule Take 1 capsule (180 mg total) by mouth daily. 30 capsule 1   dorzolamide (TRUSOPT) 2 % ophthalmic solution Place 1 drop into both eyes 2 (two) times daily.     febuxostat (ULORIC) 40 MG tablet Take 1 tablet (40 mg total) by mouth daily. 30 tablet 6   FERREX 150 150 MG capsule Take 150 mg by mouth 2 (two) times daily.     hydrALAZINE (APRESOLINE) 100 MG tablet Take 100 mg by mouth 2 (two) times daily.     hydrochlorothiazide (HYDRODIURIL) 12.5 MG tablet Take 12.5 mg by mouth daily.     hydrocortisone 1 % lotion Apply 1 application. topically 2 (two) times daily. 118 mL 3   latanoprost (XALATAN) 0.005 % ophthalmic solution Place 1 drop into both eyes at bedtime.     metFORMIN (GLUCOPHAGE-XR) 500 MG 24 hr tablet Take 500 mg by mouth 2 (two) times daily.     metoprolol succinate (TOPROL-XL) 50 MG 24 hr tablet Take 50 mg by mouth daily. Take with or immediately following a meal.     Multiple Vitamin (MULTIVITAMIN) tablet Take 1 tablet by mouth in the morning.     ONETOUCH ULTRA test strip USE 1 STRIP TO CHECK GLUCOSE ONCE DAILY     prochlorperazine (COMPAZINE) 10 MG tablet Take 1 tablet (10 mg total) by mouth every 6 (six) hours as needed for nausea or vomiting. (Patient not taking: Reported on 08/25/2021) 60 tablet 3   urea (CARMOL) 10 % cream Apply topically 2 (two) times daily. Apply topically to hands twice a day 71 g 2   vitamin C (ASCORBIC ACID) 500 MG tablet Take 500 mg by mouth 2 (two) times daily.      No current facility-administered medications for this visit.     ALLERGIES:  Allergies  Allergen Reactions   Amiodarone Rash    PHYSICAL EXAM:  Performance status (ECOG): 1 - Symptomatic but completely ambulatory  There were no vitals filed for this visit. Wt Readings from Last 3 Encounters:  08/25/21 208 lb 11.2 oz (94.7 kg)  08/17/21 205 lb (93 kg)  07/27/21 209 lb (94.8 kg)   Physical Exam Vitals reviewed.  Constitutional:      Appearance: Normal appearance. He is obese.  Cardiovascular:     Rate and Rhythm: Normal rate and regular rhythm.     Pulses: Normal pulses.     Heart sounds: Normal heart sounds.  Pulmonary:     Effort: Pulmonary effort is normal.     Breath sounds: Normal breath sounds.  Neurological:     General: No focal deficit present.     Mental Status: He is alert and oriented to person, place, and time.  Psychiatric:        Mood and Affect: Mood normal.        Behavior: Behavior normal.      LABORATORY DATA:  I have reviewed the labs as listed.     Latest Ref Rng & Units 09/15/2021    9:06 AM 08/25/2021    7:55 AM 08/17/2021    9:14 AM  CBC  WBC 4.0 - 10.5 K/uL 6.0  5.9  6.7   Hemoglobin 13.0 - 17.0 g/dL 12.3  11.4  11.9   Hematocrit 39.0 - 52.0 % 36.0  34.6  35.3   Platelets 150 - 400 K/uL 175  170  184       Latest Ref Rng & Units 08/25/2021    7:55 AM 08/17/2021    9:14 AM 07/27/2021    8:43 AM  CMP  Glucose 70 - 99 mg/dL 233  186  154   BUN 8 - 23 mg/dL '20  17  16   '$ Creatinine 0.61 - 1.24 mg/dL 0.87  0.88  0.97   Sodium 135 - 145 mmol/L 138  136  140   Potassium 3.5 - 5.1 mmol/L 3.9  3.9  4.0   Chloride 98 - 111 mmol/L 107  106  107   CO2 22 - 32 mmol/L '27  26  26   '$ Calcium 8.9 - 10.3 mg/dL 9.3  9.4  9.4   Total Protein 6.5 - 8.1 g/dL 6.3  6.5  6.7   Total Bilirubin 0.3 - 1.2 mg/dL 0.5  0.4  0.6   Alkaline Phos 38 - 126 U/L 78  80  88   AST 15 - 41 U/L '27  25  30   '$ ALT 0 - 44 U/L '23  22  27     '$ DIAGNOSTIC IMAGING:  I have independently reviewed the scans and discussed with the patient. No  results found.   ASSESSMENT:  Colon cancer: - Colonoscopy on 10/12/2020 with 7 sessile polyps found in the transverse colon and cecum.  12 mm polyp found in the transverse colon, sessile.  Polyp was removed with piecemeal technique using cold snare.  2 sessile polyps found in the sigmoid colon, removed with cold snare. - Pathology cecal tubular adenoma, sessile serrated polyp of the transverse colon polypectomy, adenocarcinoma arising from a tubular adenoma with high-grade dysplasia of the transverse colon polypectomy.  Sigmoid colon polypectomy showed tubular adenoma, hyperplastic polyp. - Colonoscopy on 02/25/2021 with three 3 to 5 mm polyps in the transverse colon, removed with cold snare.  One 10 mm polyp in the descending colon at 54 cm proximal to the anus. - Pathology on 02/25/2021 shows invasive adenocarcinoma, poorly differentiated, grade 3 of the descending colon polypectomy.  CDX2 positive.  Other findings include transverse colon tubular adenoma and sessile serrated adenoma. - Right hemicolectomy on 05/20/2021. - Pathology shows grade 3 adenocarcinoma,  mucinous features, 1.7 cm in the transverse colon, metastatic adenocarcinoma involving 2/29 lymph nodes.  No LVI/perineural invasion.  pT1, PN 1B. - Adjuvant Xeloda 1500 mg 2 weeks on/1 week off started on 07/06/2021.  Cycle 2 on 07/27/2021, 3 tablets twice daily.  Cycle 3 on 08/26/2021, 2 tablets twice daily 2 weeks on/1 week off, dose reduced secondary to HFSR.    Social/family history: He lives at home with his wife.  He quit smoking 23 years ago.  He smoked 1 pack/day for 7 years.  He worked in [place] prior to retirement.  He is active and walks 2 miles per day. - Father had prostate cancer.  Brother had lung cancer.  Another brother had colon cancer (? colon), Sister with lung cancer, another sister with cancer, patient unknown.  3.  Prostate cancer: - He was diagnosed with prostate cancer in August 2007, status post seed implants.  His  PSA has been undetectable since then.   PLAN:  Stage III (T1N1B) transverse colon adenocarcinoma: - Cycle 3 was dose reduced to 2 tablets twice daily 2 weeks on/1 week off on 08/26/2021. - He has tolerated cycle 3 very well.  He had infection of the left great toe which improved with Augmentin.  He had some infection with drainage under the right nailbed.  He is again taking Augmentin.  He will follow-up with podiatrist on Monday. - Reviewed labs today which shows normal LFTs and electrolytes.  CBC was grossly normal.  Last CEA was 5.4. - He will start cycle 4 on 09/16/2021 with same dose of 1000 mg twice daily 2 weeks on/1 week off.  RTC 3 weeks for follow-up with repeat labs.  2.  Normocytic anemia: - Anemia from myelosuppression.  Hemoglobin has improved to 12.3.  Last ferritin is 78.  3.  Hand-foot skin reaction: - He did not have any skin blistering of the palms and soles.  There is skin peeling of on the soles.  No tenderness or pain.  Continue urea cream twice daily. - No eruption on the glans penis after the dose of Xeloda was reduced to 2 tablets twice daily.   Orders placed this encounter:  No orders of the defined types were placed in this encounter.    Derek Jack, MD [place] [phone]   I, Thana Ates, am acting as a scribe for Dr. Derek Jack.  I, Derek Jack MD, have reviewed the above documentation for accuracy and completeness, and I agree with the above.

## 2021-09-15 NOTE — Patient Instructions (Addendum)
[facility] at [facility] Discharge Instructions   You were seen and examined today by Dr. Delton Coombes.  He reviewed the results of your lab work which is normal/stable.   Continue Xeloda as prescribed.   Return as scheduled for lab work and office visit.    Thank you for choosing [place] at [facility] to provide your oncology and hematology care.  To afford each patient quality time with our provider, please arrive at least 15 minutes before your scheduled appointment time.   If you have a lab appointment with the [facility] please come in thru the Main Entrance and check in at the main information desk.  You need to re-schedule your appointment should you arrive 10 or more minutes late.  We strive to give you quality time with our providers, and arriving late affects you and other patients whose appointments are after yours.  Also, if you no show three or more times for appointments you may be dismissed from the clinic at the providers discretion.     Again, thank you for choosing [facility].  Our hope is that these requests will decrease the amount of time that you wait before being seen by our physicians.       _____________________________________________________________  Should you have questions after your visit to [facility], please contact our office at [phone] and follow the prompts.  Our office hours are 8:00 a.m. and 4:30 p.m. Monday - Friday.  Please note that voicemails left after 4:00 p.m. may not be returned until the following business day.  We are closed weekends and major holidays.  You do have access to a nurse 24-7, just call the main number to the clinic [phone] and do not press any options, hold on the line and a nurse will answer the phone.    For prescription refill requests, have your pharmacy contact our office and allow 72 hours.    Due to Covid, you will need to wear a  mask upon entering the hospital. If you do not have a mask, a mask will be given to you at the Main Entrance upon arrival. For doctor visits, patients may have 1 support person age 11 or older with them. For treatment visits, patients can not have anyone with them due to social distancing guidelines and our immunocompromised population.

## 2021-09-15 NOTE — Progress Notes (Signed)
Patient is taking Xeloda as prescribed.  He has not missed any doses and reports no side effects at this time.   

## 2021-09-16 LAB — CEA: CEA: 10.1 ng/mL — ABNORMAL HIGH (ref 0.0–4.7)

## 2021-09-19 DIAGNOSIS — I739 Peripheral vascular disease, unspecified: Secondary | ICD-10-CM | POA: Diagnosis not present

## 2021-09-19 DIAGNOSIS — M79674 Pain in right toe(s): Secondary | ICD-10-CM | POA: Diagnosis not present

## 2021-09-19 DIAGNOSIS — M79675 Pain in left toe(s): Secondary | ICD-10-CM | POA: Diagnosis not present

## 2021-09-19 DIAGNOSIS — E1140 Type 2 diabetes mellitus with diabetic neuropathy, unspecified: Secondary | ICD-10-CM | POA: Diagnosis not present

## 2021-09-19 DIAGNOSIS — M79672 Pain in left foot: Secondary | ICD-10-CM | POA: Diagnosis not present

## 2021-09-19 DIAGNOSIS — M79671 Pain in right foot: Secondary | ICD-10-CM | POA: Diagnosis not present

## 2021-09-28 ENCOUNTER — Other Ambulatory Visit (HOSPITAL_COMMUNITY): Payer: Self-pay

## 2021-09-30 ENCOUNTER — Telehealth (HOSPITAL_COMMUNITY): Payer: Self-pay | Admitting: *Deleted

## 2021-09-30 NOTE — Telephone Encounter (Signed)
Pt's wife called into the clinic stating pt's both great toes has some redness, leaking clear fluids, and c/o 3/10 pain. Pt will see the podiatrist on Monday per wife. Message sent to Dr.K and he stated to stop Xeloda until he sees pt Thursday. Called pt's wife back she stated he took his last Xeloda pill last night,  and will be off for 7 days. Pt's wife told to get clarification from Dr.K, if pt can restart his chemo pill Xeloda at his next appointment Thursday per Dr.K. Pt's wife verbalized understanding.

## 2021-10-03 DIAGNOSIS — M79675 Pain in left toe(s): Secondary | ICD-10-CM | POA: Diagnosis not present

## 2021-10-03 DIAGNOSIS — I739 Peripheral vascular disease, unspecified: Secondary | ICD-10-CM | POA: Diagnosis not present

## 2021-10-03 DIAGNOSIS — M79674 Pain in right toe(s): Secondary | ICD-10-CM | POA: Diagnosis not present

## 2021-10-03 DIAGNOSIS — M79672 Pain in left foot: Secondary | ICD-10-CM | POA: Diagnosis not present

## 2021-10-03 DIAGNOSIS — B351 Tinea unguium: Secondary | ICD-10-CM | POA: Diagnosis not present

## 2021-10-03 DIAGNOSIS — E1140 Type 2 diabetes mellitus with diabetic neuropathy, unspecified: Secondary | ICD-10-CM | POA: Diagnosis not present

## 2021-10-03 DIAGNOSIS — M79671 Pain in right foot: Secondary | ICD-10-CM | POA: Diagnosis not present

## 2021-10-06 ENCOUNTER — Encounter (HOSPITAL_COMMUNITY): Payer: Self-pay | Admitting: Hematology

## 2021-10-06 ENCOUNTER — Inpatient Hospital Stay (HOSPITAL_COMMUNITY): Payer: Medicare HMO | Attending: Hematology | Admitting: Hematology

## 2021-10-06 ENCOUNTER — Inpatient Hospital Stay (HOSPITAL_COMMUNITY): Payer: Medicare HMO

## 2021-10-06 VITALS — BP 132/58 | HR 64 | Temp 97.4°F | Resp 18 | Wt 207.0 lb

## 2021-10-06 DIAGNOSIS — C180 Malignant neoplasm of cecum: Secondary | ICD-10-CM | POA: Diagnosis not present

## 2021-10-06 DIAGNOSIS — Z79899 Other long term (current) drug therapy: Secondary | ICD-10-CM | POA: Diagnosis not present

## 2021-10-06 DIAGNOSIS — Z8546 Personal history of malignant neoplasm of prostate: Secondary | ICD-10-CM | POA: Insufficient documentation

## 2021-10-06 DIAGNOSIS — C184 Malignant neoplasm of transverse colon: Secondary | ICD-10-CM

## 2021-10-06 DIAGNOSIS — D649 Anemia, unspecified: Secondary | ICD-10-CM | POA: Insufficient documentation

## 2021-10-06 DIAGNOSIS — H401112 Primary open-angle glaucoma, right eye, moderate stage: Secondary | ICD-10-CM | POA: Diagnosis not present

## 2021-10-06 LAB — CBC WITH DIFFERENTIAL/PLATELET
Abs Immature Granulocytes: 0.01 10*3/uL (ref 0.00–0.07)
Basophils Absolute: 0 10*3/uL (ref 0.0–0.1)
Basophils Relative: 1 %
Eosinophils Absolute: 0.1 10*3/uL (ref 0.0–0.5)
Eosinophils Relative: 2 %
HCT: 35.2 % — ABNORMAL LOW (ref 39.0–52.0)
Hemoglobin: 11.9 g/dL — ABNORMAL LOW (ref 13.0–17.0)
Immature Granulocytes: 0 %
Lymphocytes Relative: 17 %
Lymphs Abs: 1 10*3/uL (ref 0.7–4.0)
MCH: 34.1 pg — ABNORMAL HIGH (ref 26.0–34.0)
MCHC: 33.8 g/dL (ref 30.0–36.0)
MCV: 100.9 fL — ABNORMAL HIGH (ref 80.0–100.0)
Monocytes Absolute: 0.6 10*3/uL (ref 0.1–1.0)
Monocytes Relative: 11 %
Neutro Abs: 4 10*3/uL (ref 1.7–7.7)
Neutrophils Relative %: 69 %
Platelets: 182 10*3/uL (ref 150–400)
RBC: 3.49 MIL/uL — ABNORMAL LOW (ref 4.22–5.81)
RDW: 18 % — ABNORMAL HIGH (ref 11.5–15.5)
WBC: 5.8 10*3/uL (ref 4.0–10.5)
nRBC: 0 % (ref 0.0–0.2)

## 2021-10-06 LAB — COMPREHENSIVE METABOLIC PANEL
ALT: 23 U/L (ref 0–44)
AST: 32 U/L (ref 15–41)
Albumin: 4.1 g/dL (ref 3.5–5.0)
Alkaline Phosphatase: 69 U/L (ref 38–126)
Anion gap: 5 (ref 5–15)
BUN: 22 mg/dL (ref 8–23)
CO2: 25 mmol/L (ref 22–32)
Calcium: 9.4 mg/dL (ref 8.9–10.3)
Chloride: 107 mmol/L (ref 98–111)
Creatinine, Ser: 1.12 mg/dL (ref 0.61–1.24)
GFR, Estimated: >60 mL/min (ref >60)
Glucose, Bld: 100 mg/dL — ABNORMAL HIGH (ref 70–99)
Potassium: 4.1 mmol/L (ref 3.5–5.1)
Sodium: 137 mmol/L (ref 135–145)
Total Bilirubin: 0.7 mg/dL (ref 0.3–1.2)
Total Protein: 6.8 g/dL (ref 6.5–8.1)

## 2021-10-06 LAB — MAGNESIUM: Magnesium: 2.1 mg/dL (ref 1.7–2.4)

## 2021-10-06 NOTE — Progress Notes (Signed)
[place], [place] [address]   CLINIC:  Medical Oncology/Hematology  PCP:  Caryl Bis, MD [address] [place] [address] [phone]   REASON FOR VISIT:  Follow-up for colon cancer  PRIOR THERAPY: none  NGS Results: not done  CURRENT THERAPY: surveillance  BRIEF ONCOLOGIC HISTORY:  Oncology History   No history exists.    CANCER STAGING: Cancer Staging  Colon cancer [facility] Staging form: Colon and Rectum, AJCC 8th Edition - Clinical stage from 03/22/2021: Stage I (cT1, cN0, cM0) - Unsigned - Pathologic stage from 06/23/2021: Stage IIIA (pT1, pN1b, cM0) - Unsigned   INTERVAL HISTORY:  [name], a 81 y.o. male, returns for routine follow-up of his colon cancer. [name] [name] was last seen on 09/15/2021.   Today he reports feeling good. He reports tightness in his hands and feet for the past week. He reports mild skin peeling on his toes. He denies mouth sores. He is eating well.   REVIEW OF SYSTEMS:  Review of Systems  Constitutional:  Negative for appetite change and fatigue.  HENT:   Negative for mouth sores.   Musculoskeletal:  Positive for arthralgias (3/10 toes).  All other systems reviewed and are negative.   PAST MEDICAL/SURGICAL HISTORY:  Past Medical History:  Diagnosis Date   Anemia    Arthritis    CAD (coronary artery disease)    DES to circumflex 12/2017   Diabetes mellitus without complication (HCC)    GERD (gastroesophageal reflux disease)    Glaucoma    Gout    Headache    History of kidney stones    Hypertension    NSTEMI (non-ST elevated myocardial infarction) ([facility]) 12/31/2017   Paroxysmal atrial fibrillation (HCC)    Pericardial effusion    Idiopathic, recurrent pericardial effusion s/p pericardial window.   Prostate cancer ([facility]) 2007   S/P Seed implants    Supraventricular tachycardia ([facility])    Temporal arteritis ([facility])    Past Surgical History:  Procedure Laterality Date   BIOPSY   10/12/2020   Procedure: BIOPSY;  Surgeon: [name], MD;  [facility];  Service: Gastroenterology;;   BIOPSY  02/25/2021   Procedure: BIOPSY;  Surgeon: [name], MD;  [facility];  Service: Gastroenterology;;   BIOPSY  04/11/2021   Procedure: BIOPSY;  Surgeon: Irving Copas., MD;  Location: [facility];  Service: Gastroenterology;;   CATARACT EXTRACTION W/PHACO Left 06/07/2015   Procedure: CATARACT EXTRACTION PHACO AND INTRAOCULAR LENS PLACEMENT LEFT EYE CDE=8.00;  Surgeon: Tonny Branch, MD;  Location: [facility];  Service: Ophthalmology;  Laterality: Left;   CATARACT EXTRACTION W/PHACO Right 06/17/2015   Procedure: CATARACT EXTRACTION PHACO AND INTRAOCULAR LENS PLACEMENT RIGHT EYE CDE=11.09;  Surgeon: Tonny Branch, MD;  Location: [facility];  Service: Ophthalmology;  Laterality: Right;   COLONOSCOPY WITH PROPOFOL N/A 10/12/2020   Procedure: COLONOSCOPY WITH PROPOFOL;  Surgeon: [name], MD;  [facility];  Service: Gastroenterology;  Laterality: N/A;  10:35   COLONOSCOPY WITH PROPOFOL N/A 02/25/2021   Procedure: COLONOSCOPY WITH PROPOFOL;  Surgeon: [name], MD;  [facility];  Service: Gastroenterology;  Laterality: N/A;  8:10   CORONARY ANGIOPLASTY WITH STENT PLACEMENT  01/01/2018   CORONARY STENT INTERVENTION N/A 01/01/2018   Procedure: CORONARY STENT INTERVENTION;  Surgeon: Jettie Booze, MD;  Location: [facility];  Service: Cardiovascular;  Laterality: N/A;   ENDOSCOPIC MUCOSAL RESECTION N/A  04/11/2021   Procedure: ENDOSCOPIC MUCOSAL RESECTION;  Surgeon: Rush Landmark Telford Nab., MD;  Location: [facility];  Service: Gastroenterology;  Laterality: N/A;   ENTEROSCOPY N/A 10/12/2020   Procedure: PUSH ENTEROSCOPY;  Surgeon: [name], MD;  [facility];  Service: Gastroenterology;  Laterality: N/A;   ENTEROSCOPY N/A 04/11/2021   Procedure:  ENTEROSCOPY;  Surgeon: Rush Landmark Telford Nab., MD;  Location: [facility];  Service: Gastroenterology;  Laterality: N/A;   ESOPHAGOGASTRODUODENOSCOPY (EGD) WITH PROPOFOL N/A 10/12/2020   Procedure: ESOPHAGOGASTRODUODENOSCOPY (EGD) WITH PROPOFOL;  Surgeon: [name], MD;  [facility];  Service: Gastroenterology;  Laterality: N/A;   FRACTURE SURGERY Left 2010   ankle   HEMOSTASIS CLIP PLACEMENT  04/11/2021   Procedure: HEMOSTASIS CLIP PLACEMENT;  Surgeon: Irving Copas., MD;  [facility];  Service: Gastroenterology;;   INSERTION PROSTATE RADIATION SEED  2007   KNEE ARTHROSCOPY Left    LAPAROSCOPY N/A 05/22/2021   Procedure: LAPAROSCOPY DIAGNOSTIC WITH WASHOUT OF ABDOMEN;  Surgeon: Leighton Ruff, MD;  Location: [facility];  Service: General;  Laterality: N/A;   LEFT HEART CATH AND CORONARY ANGIOGRAPHY N/A 01/01/2018   Procedure: LEFT HEART CATH AND CORONARY ANGIOGRAPHY;  Surgeon: Jettie Booze, MD;  Location: [facility];  Service: Cardiovascular;  Laterality: N/A;   ORIF TIBIA & FIBULA FRACTURES Left 2003   Distal tibial/fibula    PERICARDIAL WINDOW  02/2007   PERICARDIOCENTESIS  2007   hx/notes 10/19/2011   POLYPECTOMY  10/12/2020   Procedure: POLYPECTOMY;  Surgeon: [name], MD;  [facility];  Service: Gastroenterology;;  small bowel, cecal   POLYPECTOMY  02/25/2021   Procedure: POLYPECTOMY;  Surgeon: [name], MD;  [facility];  Service: Gastroenterology;;  transverse colon x2   SUBMUCOSAL LIFTING INJECTION  04/11/2021   Procedure: SUBMUCOSAL LIFTING INJECTION;  Surgeon: Irving Copas., MD;  Location: [facility];  Service: Gastroenterology;;   SUBMUCOSAL TATTOO INJECTION  04/11/2021   Procedure: SUBMUCOSAL TATTOO INJECTION;  Surgeon: Irving Copas., MD;  Location: [facility];  Service: Gastroenterology;;    SOCIAL HISTORY:  Social History   Socioeconomic  History   Marital status: Married    Spouse name: Not on file   Number of children: Not on file   Years of education: Not on file   Highest education level: Not on file  Occupational History   Not on file  Tobacco Use   Smoking status: Former    Packs/day: 0.30    Years: 10.00    Total pack years: 3.00    Types: Cigarettes    Start date: 08/06/1958    Quit date: 1968    Years since quitting: 55.5   Smokeless tobacco: Never  Vaping Use   Vaping Use: Never used  Substance and Sexual Activity   Alcohol use: Not Currently   Drug use: Never   Sexual activity: Yes  Other Topics Concern   Not on file  Social History Narrative   Not on file   Social Determinants of Health   Financial Resource Strain: Not on file  Food Insecurity: Not on file  Transportation Needs: Not on file  Physical Activity: Not on file  Stress: Not on file  Social Connections: Not on file  Intimate Partner Violence: Not on file    FAMILY HISTORY:  Family History  Problem Relation Age of Onset   CAD Mother        MI in 38s   Prostate cancer  Father    Prostate cancer Brother    Lung cancer Brother     CURRENT MEDICATIONS:  Current Outpatient Medications  Medication Sig Dispense Refill   apixaban (ELIQUIS) 5 MG TABS tablet Take 1 tablet (5 mg total) by mouth 2 (two) times daily. 60 tablet 6   atorvastatin (LIPITOR) 80 MG tablet TAKE 1 TABLET BY MOUTH ONCE DAILY AT  6  PM 90 tablet 0   benazepril (LOTENSIN) 40 MG tablet TAKE 1 TABLET BY MOUTH ONCE DAILY** NEEDS  OFFICE  VISIT 90 tablet 1   capecitabine (XELODA) 500 MG tablet Take 3 tablets (1,500 mg total) by mouth 2 (two) times daily after a meal. Take 3 tablets 2 times a day for 21 days and off for 7 days.  Take with food. 126 tablet 0   diltiazem (CARDIZEM CD) 180 MG 24 hr capsule Take 1 capsule (180 mg total) by mouth daily. 30 capsule 1   dorzolamide (TRUSOPT) 2 % ophthalmic solution Place 1 drop into both eyes 2 (two) times daily.      febuxostat (ULORIC) 40 MG tablet Take 1 tablet (40 mg total) by mouth daily. 30 tablet 6   FERREX 150 150 MG capsule Take 150 mg by mouth 2 (two) times daily.     hydrALAZINE (APRESOLINE) 100 MG tablet Take 100 mg by mouth 2 (two) times daily.     hydrochlorothiazide (HYDRODIURIL) 12.5 MG tablet Take 12.5 mg by mouth daily.     hydrocortisone 1 % lotion Apply 1 application. topically 2 (two) times daily. 118 mL 3   latanoprost (XALATAN) 0.005 % ophthalmic solution Place 1 drop into both eyes at bedtime.     metFORMIN (GLUCOPHAGE-XR) 500 MG 24 hr tablet Take 500 mg by mouth 2 (two) times daily.     metoprolol succinate (TOPROL-XL) 50 MG 24 hr tablet Take 50 mg by mouth daily. Take with or immediately following a meal.     Multiple Vitamin (MULTIVITAMIN) tablet Take 1 tablet by mouth in the morning.     ONETOUCH ULTRA test strip USE 1 STRIP TO CHECK GLUCOSE ONCE DAILY     prochlorperazine (COMPAZINE) 10 MG tablet Take 1 tablet (10 mg total) by mouth every 6 (six) hours as needed for nausea or vomiting. 60 tablet 3   urea (CARMOL) 10 % cream Apply topically 2 (two) times daily. Apply topically to hands twice a day 71 g 2   vitamin C (ASCORBIC ACID) 500 MG tablet Take 500 mg by mouth 2 (two) times daily.      No current facility-administered medications for this visit.    ALLERGIES:  Allergies  Allergen Reactions   Amiodarone Rash    PHYSICAL EXAM:  Performance status (ECOG): 1 - Symptomatic but completely ambulatory  There were no vitals filed for this visit. Wt Readings from Last 3 Encounters:  09/15/21 209 lb 9.6 oz (95.1 kg)  08/25/21 208 lb 11.2 oz (94.7 kg)  08/17/21 205 lb (93 kg)   Physical Exam Vitals reviewed.  Constitutional:      Appearance: Normal appearance.  Cardiovascular:     Rate and Rhythm: Normal rate and regular rhythm.     Pulses: Normal pulses.     Heart sounds: Normal heart sounds.  Pulmonary:     Effort: Pulmonary effort is normal.     Breath sounds:  Normal breath sounds.  Neurological:     General: No focal deficit present.     Mental Status: He is alert and oriented to  person, place, and time.  Psychiatric:        Mood and Affect: Mood normal.        Behavior: Behavior normal.      LABORATORY DATA:  I have reviewed the labs as listed.     Latest Ref Rng & Units 09/15/2021    9:06 AM 08/25/2021    7:55 AM 08/17/2021    9:14 AM  CBC  WBC 4.0 - 10.5 K/uL 6.0  5.9  6.7   Hemoglobin 13.0 - 17.0 g/dL 12.3  11.4  11.9   Hematocrit 39.0 - 52.0 % 36.0  34.6  35.3   Platelets 150 - 400 K/uL 175  170  184       Latest Ref Rng & Units 09/15/2021    9:06 AM 08/25/2021    7:55 AM 08/17/2021    9:14 AM  CMP  Glucose 70 - 99 mg/dL 138  233  186   BUN 8 - 23 mg/dL '14  20  17   '$ Creatinine 0.61 - 1.24 mg/dL 0.78  0.87  0.88   Sodium 135 - 145 mmol/L 137  138  136   Potassium 3.5 - 5.1 mmol/L 3.8  3.9  3.9   Chloride 98 - 111 mmol/L 106  107  106   CO2 22 - 32 mmol/L '24  27  26   '$ Calcium 8.9 - 10.3 mg/dL 9.5  9.3  9.4   Total Protein 6.5 - 8.1 g/dL 7.1  6.3  6.5   Total Bilirubin 0.3 - 1.2 mg/dL 0.5  0.5  0.4   Alkaline Phos 38 - 126 U/L 78  78  80   AST 15 - 41 U/L '28  27  25   '$ ALT 0 - 44 U/L '22  23  22     '$ DIAGNOSTIC IMAGING:  I have independently reviewed the scans and discussed with the patient. No results found.   ASSESSMENT:  Colon cancer: - Colonoscopy on 10/12/2020 with 7 sessile polyps found in the transverse colon and cecum.  12 mm polyp found in the transverse colon, sessile.  Polyp was removed with piecemeal technique using cold snare.  2 sessile polyps found in the sigmoid colon, removed with cold snare. - Pathology cecal tubular adenoma, sessile serrated polyp of the transverse colon polypectomy, adenocarcinoma arising from a tubular adenoma with high-grade dysplasia of the transverse colon polypectomy.  Sigmoid colon polypectomy showed tubular adenoma, hyperplastic polyp. - Colonoscopy on 02/25/2021 with three 3 to 5 mm  polyps in the transverse colon, removed with cold snare.  One 10 mm polyp in the descending colon at 54 cm proximal to the anus. - Pathology on 02/25/2021 shows invasive adenocarcinoma, poorly differentiated, grade 3 of the descending colon polypectomy.  CDX2 positive.  Other findings include transverse colon tubular adenoma and sessile serrated adenoma. - Right hemicolectomy on 05/20/2021. - Pathology shows grade 3 adenocarcinoma, mucinous features, 1.7 cm in the transverse colon, metastatic adenocarcinoma involving 2/29 lymph nodes.  No LVI/perineural invasion.  pT1, PN 1B. - Adjuvant Xeloda 1500 mg 2 weeks on/1 week off started on 07/06/2021.  Cycle 2 on 07/27/2021, 3 tablets twice daily.  Cycle 3 on 08/26/2021, 2 tablets twice daily 2 weeks on/1 week off, dose reduced secondary to HFSR.  Cycle 4 on 09/16/2021.    Social/family history: He lives at home with his wife.  He quit smoking 23 years ago.  He smoked 1 pack/day for 7 years.  He worked in [employer] prior to retirement.  He is active and walks 2 miles per day. - Father had prostate cancer.  Brother had lung cancer.  Another brother had colon cancer (? colon), Sister with lung cancer, another sister with cancer, patient unknown.  3.  Prostate cancer: - He was diagnosed with prostate cancer in August 2007, status post seed implants.  His PSA has been undetectable since then.   PLAN:  Stage III (T1N1B) transverse colon adenocarcinoma: - He has tolerated cycle 3 of Xeloda at reduced dose 1000 mg twice daily 2 weeks on/1 week off reasonably well. - She is continuing to be on antibiotics Ceftin for 2 infection. - Reviewed labs today which showed normal LFTs and creatinine.  CBC was grossly normal.  However her CEA has increased to 10.1 from 5.2 previously. - We have sent a CEA level from today which is pending. - He will proceed with next cycle of Xeloda. - If the CEA is about 10 again, will consider imaging.  RTC 3 weeks for follow-up.  2.   Normocytic anemia: -Anemia from myelosuppression.  Hemoglobin is 11.9 today.  Last ferritin is 78.  3.  Hand-foot skin reaction: - He does not have any skin blistering of the palms and soles.  No peeling.  No pain or tenderness.  Continue urea cream twice daily.   Orders placed this encounter:  No orders of the defined types were placed in this encounter.    Derek Jack, MD [place] [phone]   I, Thana Ates, am acting as a scribe for Dr. Derek Jack.  I, Derek Jack MD, have reviewed the above documentation for accuracy and completeness, and I agree with the above.

## 2021-10-07 LAB — CEA: CEA: 9 ng/mL — ABNORMAL HIGH (ref 0.0–4.7)

## 2021-10-17 DIAGNOSIS — M79671 Pain in right foot: Secondary | ICD-10-CM | POA: Diagnosis not present

## 2021-10-17 DIAGNOSIS — L03032 Cellulitis of left toe: Secondary | ICD-10-CM | POA: Diagnosis not present

## 2021-10-17 DIAGNOSIS — L600 Ingrowing nail: Secondary | ICD-10-CM | POA: Diagnosis not present

## 2021-10-17 DIAGNOSIS — M79674 Pain in right toe(s): Secondary | ICD-10-CM | POA: Diagnosis not present

## 2021-10-20 ENCOUNTER — Other Ambulatory Visit (HOSPITAL_COMMUNITY): Payer: Self-pay | Admitting: Pharmacist

## 2021-10-20 ENCOUNTER — Other Ambulatory Visit (HOSPITAL_COMMUNITY): Payer: Self-pay

## 2021-10-20 DIAGNOSIS — C184 Malignant neoplasm of transverse colon: Secondary | ICD-10-CM

## 2021-10-20 MED ORDER — CAPECITABINE 500 MG PO TABS
1000.0000 mg | ORAL_TABLET | Freq: Two times a day (BID) | ORAL | 0 refills | Status: DC
  Filled 2021-10-20: qty 56, 14d supply, fill #0

## 2021-10-24 ENCOUNTER — Other Ambulatory Visit (HOSPITAL_COMMUNITY): Payer: Self-pay

## 2021-10-25 ENCOUNTER — Other Ambulatory Visit (HOSPITAL_COMMUNITY): Payer: Self-pay

## 2021-10-26 ENCOUNTER — Inpatient Hospital Stay: Payer: Medicare HMO | Attending: Hematology

## 2021-10-26 DIAGNOSIS — C180 Malignant neoplasm of cecum: Secondary | ICD-10-CM | POA: Diagnosis not present

## 2021-10-26 DIAGNOSIS — Z87891 Personal history of nicotine dependence: Secondary | ICD-10-CM | POA: Insufficient documentation

## 2021-10-26 DIAGNOSIS — C184 Malignant neoplasm of transverse colon: Secondary | ICD-10-CM | POA: Diagnosis not present

## 2021-10-26 DIAGNOSIS — Z79899 Other long term (current) drug therapy: Secondary | ICD-10-CM | POA: Insufficient documentation

## 2021-10-26 DIAGNOSIS — Z801 Family history of malignant neoplasm of trachea, bronchus and lung: Secondary | ICD-10-CM | POA: Insufficient documentation

## 2021-10-26 DIAGNOSIS — Z809 Family history of malignant neoplasm, unspecified: Secondary | ICD-10-CM | POA: Diagnosis not present

## 2021-10-26 DIAGNOSIS — Z8546 Personal history of malignant neoplasm of prostate: Secondary | ICD-10-CM | POA: Insufficient documentation

## 2021-10-26 DIAGNOSIS — Z800 Family history of malignant neoplasm of digestive organs: Secondary | ICD-10-CM | POA: Insufficient documentation

## 2021-10-26 DIAGNOSIS — Z8042 Family history of malignant neoplasm of prostate: Secondary | ICD-10-CM | POA: Insufficient documentation

## 2021-10-26 LAB — COMPREHENSIVE METABOLIC PANEL
ALT: 22 U/L (ref 0–44)
AST: 22 U/L (ref 15–41)
Albumin: 4 g/dL (ref 3.5–5.0)
Alkaline Phosphatase: 79 U/L (ref 38–126)
Anion gap: 6 (ref 5–15)
BUN: 22 mg/dL (ref 8–23)
CO2: 27 mmol/L (ref 22–32)
Calcium: 9.8 mg/dL (ref 8.9–10.3)
Chloride: 106 mmol/L (ref 98–111)
Creatinine, Ser: 0.95 mg/dL (ref 0.61–1.24)
GFR, Estimated: >60 mL/min (ref >60)
Glucose, Bld: 135 mg/dL — ABNORMAL HIGH (ref 70–99)
Potassium: 4.1 mmol/L (ref 3.5–5.1)
Sodium: 139 mmol/L (ref 135–145)
Total Bilirubin: 0.6 mg/dL (ref 0.3–1.2)
Total Protein: 6.9 g/dL (ref 6.5–8.1)

## 2021-10-26 LAB — CBC WITH DIFFERENTIAL/PLATELET
Abs Immature Granulocytes: 0.04 10*3/uL (ref 0.00–0.07)
Basophils Absolute: 0 10*3/uL (ref 0.0–0.1)
Basophils Relative: 1 %
Eosinophils Absolute: 0.1 10*3/uL (ref 0.0–0.5)
Eosinophils Relative: 1 %
HCT: 35.3 % — ABNORMAL LOW (ref 39.0–52.0)
Hemoglobin: 12 g/dL — ABNORMAL LOW (ref 13.0–17.0)
Immature Granulocytes: 1 %
Lymphocytes Relative: 14 %
Lymphs Abs: 1 10*3/uL (ref 0.7–4.0)
MCH: 34.7 pg — ABNORMAL HIGH (ref 26.0–34.0)
MCHC: 34 g/dL (ref 30.0–36.0)
MCV: 102 fL — ABNORMAL HIGH (ref 80.0–100.0)
Monocytes Absolute: 0.7 10*3/uL (ref 0.1–1.0)
Monocytes Relative: 10 %
Neutro Abs: 5.2 10*3/uL (ref 1.7–7.7)
Neutrophils Relative %: 73 %
Platelets: 174 10*3/uL (ref 150–400)
RBC: 3.46 MIL/uL — ABNORMAL LOW (ref 4.22–5.81)
RDW: 17.2 % — ABNORMAL HIGH (ref 11.5–15.5)
WBC: 7 10*3/uL (ref 4.0–10.5)
nRBC: 0 % (ref 0.0–0.2)

## 2021-10-26 LAB — MAGNESIUM: Magnesium: 1.9 mg/dL (ref 1.7–2.4)

## 2021-10-27 ENCOUNTER — Inpatient Hospital Stay (HOSPITAL_BASED_OUTPATIENT_CLINIC_OR_DEPARTMENT_OTHER): Payer: Medicare HMO | Admitting: Hematology

## 2021-10-27 VITALS — BP 120/63 | HR 54 | Temp 98.4°F | Resp 18 | Ht 70.0 in | Wt 209.3 lb

## 2021-10-27 DIAGNOSIS — Z8546 Personal history of malignant neoplasm of prostate: Secondary | ICD-10-CM | POA: Diagnosis not present

## 2021-10-27 DIAGNOSIS — Z801 Family history of malignant neoplasm of trachea, bronchus and lung: Secondary | ICD-10-CM | POA: Diagnosis not present

## 2021-10-27 DIAGNOSIS — Z809 Family history of malignant neoplasm, unspecified: Secondary | ICD-10-CM | POA: Diagnosis not present

## 2021-10-27 DIAGNOSIS — C184 Malignant neoplasm of transverse colon: Secondary | ICD-10-CM | POA: Diagnosis not present

## 2021-10-27 DIAGNOSIS — Z87891 Personal history of nicotine dependence: Secondary | ICD-10-CM | POA: Diagnosis not present

## 2021-10-27 DIAGNOSIS — Z800 Family history of malignant neoplasm of digestive organs: Secondary | ICD-10-CM | POA: Diagnosis not present

## 2021-10-27 DIAGNOSIS — Z8042 Family history of malignant neoplasm of prostate: Secondary | ICD-10-CM | POA: Diagnosis not present

## 2021-10-27 DIAGNOSIS — C180 Malignant neoplasm of cecum: Secondary | ICD-10-CM | POA: Diagnosis not present

## 2021-10-27 DIAGNOSIS — Z79899 Other long term (current) drug therapy: Secondary | ICD-10-CM | POA: Diagnosis not present

## 2021-10-27 LAB — CEA: CEA: 10.1 ng/mL — ABNORMAL HIGH (ref 0.0–4.7)

## 2021-10-27 NOTE — Patient Instructions (Addendum)
[place] at [facility] Discharge Instructions  You were seen and examined today by Dr. Delton Coombes.  Dr. Delton Coombes discussed your most recent lab work and your CEA is elevated at 10.1 from your lab work on 10/26/21. Hold the Xeloda until you see Dr. Delton Coombes again.   We will get you scheduled for CT of your chest abdomen and pelvis.  Follow-up as scheduled.    Thank you for choosing [place] at [facility] to provide your oncology and hematology care.  To afford each patient quality time with our provider, please arrive at least 15 minutes before your scheduled appointment time.   If you have a lab appointment with the [facility] please come in thru the Main Entrance and check in at the main information desk.  You need to re-schedule your appointment should you arrive 10 or more minutes late.  We strive to give you quality time with our providers, and arriving late affects you and other patients whose appointments are after yours.  Also, if you no show three or more times for appointments you may be dismissed from the clinic at the providers discretion.     Again, thank you for choosing [facility].  Our hope is that these requests will decrease the amount of time that you wait before being seen by our physicians.       _____________________________________________________________  Should you have questions after your visit to [facility], please contact our office at [phone] and follow the prompts.  Our office hours are 8:00 a.m. and 4:30 p.m. Monday - Friday.  Please note that voicemails left after 4:00 p.m. may not be returned until the following business day.  We are closed weekends and major holidays.  You do have access to a nurse 24-7, just call the main number to the clinic [phone] and do not press any options, hold on the line and a nurse will answer the phone.    For prescription refill  requests, have your pharmacy contact our office and allow 72 hours.

## 2021-10-27 NOTE — Progress Notes (Signed)
[place], [place] [address]   CLINIC:  Medical Oncology/Hematology  PCP:  Caryl Bis, MD [address] [place] [address] [phone]   REASON FOR VISIT:  Follow-up for colon cancer  PRIOR THERAPY: none  NGS Results: not done  CURRENT THERAPY: surveillance  CANCER STAGING: Cancer Staging  Colon cancer [facility]) Staging form: Colon and Rectum, AJCC 8th Edition - Clinical stage from 03/22/2021: Stage I (cT1, cN0, cM0) - Unsigned - Pathologic stage from 06/23/2021: Stage IIIA (pT1, pN1b, cM0) - Unsigned   INTERVAL HISTORY:  [name] [name], a 81 y.o. male, returns for routine follow-up of his colon cancer. [name] [name] was last seen on 10/06/2021.   Today he reports feeling good. He reports painful red, blisters on the sole of his feet bilaterally which make it difficult to walk. He also reports blistering and pain on his palms. He reports burning and tingling in his palms. He denies mouth sores.   REVIEW OF SYSTEMS:  Review of Systems  Constitutional:  Negative for appetite change and fatigue.  HENT:   Negative for mouth sores.   Respiratory:  Positive for cough.   Gastrointestinal:  Positive for nausea.  Musculoskeletal:  Positive for arthralgias (5/10 feet and hands) and gait problem (d/t pain on soles).  Neurological:  Positive for gait problem (d/t pain on soles) and numbness (palms).  Psychiatric/Behavioral:  Positive for depression. The patient is nervous/anxious.   All other systems reviewed and are negative.   PAST MEDICAL/SURGICAL HISTORY:  Past Medical History:  Diagnosis Date   Anemia    Arthritis    CAD (coronary artery disease)    DES to circumflex 12/2017   Diabetes mellitus without complication (HCC)    GERD (gastroesophageal reflux disease)    Glaucoma    Gout    Headache    History of kidney stones    Hypertension    NSTEMI (non-ST elevated myocardial infarction) ([place]) 12/31/2017   Paroxysmal atrial  fibrillation (HCC)    Pericardial effusion    Idiopathic, recurrent pericardial effusion s/p pericardial window.   Prostate cancer ([place]) 2007   S/P Seed implants    Supraventricular tachycardia ([place])    Temporal arteritis ([place])    Past Surgical History:  Procedure Laterality Date   BIOPSY  10/12/2020   Procedure: BIOPSY;  Surgeon: [name], MD;  [facility];  Service: Gastroenterology;;   BIOPSY  02/25/2021   Procedure: BIOPSY;  Surgeon: [name], MD;  [facility];  Service: Gastroenterology;;   BIOPSY  04/11/2021   Procedure: BIOPSY;  Surgeon: Irving Copas., MD;  Location: [facility];  Service: Gastroenterology;;   CATARACT EXTRACTION W/PHACO Left 06/07/2015   Procedure: CATARACT EXTRACTION PHACO AND INTRAOCULAR LENS PLACEMENT LEFT EYE CDE=8.00;  Surgeon: Tonny Branch, MD;  Location: [facility];  Service: Ophthalmology;  Laterality: Left;   CATARACT EXTRACTION W/PHACO Right 06/17/2015   Procedure: CATARACT EXTRACTION PHACO AND INTRAOCULAR LENS PLACEMENT RIGHT EYE CDE=11.09;  Surgeon: Tonny Branch, MD;  Location: [facility];  Service: Ophthalmology;  Laterality: Right;   COLONOSCOPY WITH PROPOFOL N/A 10/12/2020   Procedure: COLONOSCOPY WITH PROPOFOL;  Surgeon: [name], MD;  [facility];  Service: Gastroenterology;  Laterality: N/A;  10:35   COLONOSCOPY WITH PROPOFOL N/A 02/25/2021   Procedure: COLONOSCOPY WITH PROPOFOL;  Surgeon: [name], MD;  [facility];  Service: Gastroenterology;  Laterality: N/A;  8:10  CORONARY ANGIOPLASTY WITH STENT PLACEMENT  01/01/2018   CORONARY STENT INTERVENTION N/A 01/01/2018   Procedure: CORONARY STENT INTERVENTION;  Surgeon: Jettie Booze, MD;  Location: [facility];  Service: Cardiovascular;  Laterality: N/A;   ENDOSCOPIC MUCOSAL RESECTION N/A 04/11/2021   Procedure: ENDOSCOPIC MUCOSAL RESECTION;  Surgeon: Rush Landmark Telford Nab.,  MD;  [facility];  Service: Gastroenterology;  Laterality: N/A;   ENTEROSCOPY N/A 10/12/2020   Procedure: PUSH ENTEROSCOPY;  Surgeon: [name], MD;  [facility];  Service: Gastroenterology;  Laterality: N/A;   ENTEROSCOPY N/A 04/11/2021   Procedure: ENTEROSCOPY;  Surgeon: Rush Landmark Telford Nab., MD;  Location: [facility];  Service: Gastroenterology;  Laterality: N/A;   ESOPHAGOGASTRODUODENOSCOPY (EGD) WITH PROPOFOL N/A 10/12/2020   Procedure: ESOPHAGOGASTRODUODENOSCOPY (EGD) WITH PROPOFOL;  Surgeon: [name], MD;  [facility];  Service: Gastroenterology;  Laterality: N/A;   FRACTURE SURGERY Left 2010   ankle   HEMOSTASIS CLIP PLACEMENT  04/11/2021   Procedure: HEMOSTASIS CLIP PLACEMENT;  Surgeon: Irving Copas., MD;  [facility];  Service: Gastroenterology;;   INSERTION PROSTATE RADIATION SEED  2007   KNEE ARTHROSCOPY Left    LAPAROSCOPY N/A 05/22/2021   Procedure: LAPAROSCOPY DIAGNOSTIC WITH WASHOUT OF ABDOMEN;  Surgeon: Leighton Ruff, MD;  Location: [facility];  Service: General;  Laterality: N/A;   LEFT HEART CATH AND CORONARY ANGIOGRAPHY N/A 01/01/2018   Procedure: LEFT HEART CATH AND CORONARY ANGIOGRAPHY;  Surgeon: Jettie Booze, MD;  Location: [facility];  Service: Cardiovascular;  Laterality: N/A;   ORIF TIBIA & FIBULA FRACTURES Left 2003   Distal tibial/fibula    PERICARDIAL WINDOW  02/2007   PERICARDIOCENTESIS  2007   hx/notes 10/19/2011   POLYPECTOMY  10/12/2020   Procedure: POLYPECTOMY;  Surgeon: [name], MD;  [facility];  Service: Gastroenterology;;  small bowel, cecal   POLYPECTOMY  02/25/2021   Procedure: POLYPECTOMY;  Surgeon: [name], MD;  [facility];  Service: Gastroenterology;;  transverse colon x2   SUBMUCOSAL LIFTING INJECTION  04/11/2021   Procedure: SUBMUCOSAL LIFTING INJECTION;  Surgeon: Irving Copas.,  MD;  Location: [facility];  Service: Gastroenterology;;   SUBMUCOSAL TATTOO INJECTION  04/11/2021   Procedure: SUBMUCOSAL TATTOO INJECTION;  Surgeon: Irving Copas., MD;  Location: [facility];  Service: Gastroenterology;;    SOCIAL HISTORY:  Social History   Socioeconomic History   Marital status: Married    Spouse name: Not on file   Number of children: Not on file   Years of education: Not on file   Highest education level: Not on file  Occupational History   Not on file  Tobacco Use   Smoking status: Former    Packs/day: 0.30    Years: 10.00    Total pack years: 3.00    Types: Cigarettes    Start date: 08/06/1958    Quit date: 1968    Years since quitting: 55.6   Smokeless tobacco: Never  Vaping Use   Vaping Use: Never used  Substance and Sexual Activity   Alcohol use: Not Currently   Drug use: Never   Sexual activity: Yes  Other Topics Concern   Not on file  Social History Narrative   Not on file   Social Determinants of Health   Financial Resource Strain: Not on file  Food Insecurity: Not on file  Transportation Needs: Not on file  Physical Activity: Not on file  Stress: Not on file  Social Connections: Not on file  Intimate Partner Violence: Not on file    FAMILY HISTORY:  Family History  Problem Relation Age of Onset   CAD Mother        MI in 1s   Prostate cancer Father    Prostate cancer Brother    Lung cancer Brother     CURRENT MEDICATIONS:  Current Outpatient Medications  Medication Sig Dispense Refill   apixaban (ELIQUIS) 5 MG TABS tablet Take 1 tablet (5 mg total) by mouth 2 (two) times daily. 60 tablet 6   atorvastatin (LIPITOR) 80 MG tablet TAKE 1 TABLET BY MOUTH ONCE DAILY AT  6  PM 90 tablet 0   benazepril (LOTENSIN) 40 MG tablet TAKE 1 TABLET BY MOUTH ONCE DAILY** NEEDS  OFFICE  VISIT 90 tablet 1   capecitabine (XELODA) 500 MG tablet Take 2 tablets (1,000 mg total) by mouth 2 (two) times daily after a meal. Take for 14  days, then hold 7 days. Repeat every 21 days. 56 tablet 0   cefdinir (OMNICEF) 300 MG capsule Take 300 mg by mouth 2 (two) times daily.     diltiazem (CARDIZEM CD) 180 MG 24 hr capsule Take 1 capsule (180 mg total) by mouth daily. 30 capsule 1   dorzolamide (TRUSOPT) 2 % ophthalmic solution Place 1 drop into both eyes 2 (two) times daily.     febuxostat (ULORIC) 40 MG tablet Take 1 tablet (40 mg total) by mouth daily. 30 tablet 6   FERREX 150 150 MG capsule Take 150 mg by mouth 2 (two) times daily.     hydrALAZINE (APRESOLINE) 100 MG tablet Take 100 mg by mouth 2 (two) times daily.     hydrochlorothiazide (HYDRODIURIL) 12.5 MG tablet Take 12.5 mg by mouth daily.     hydrocortisone 1 % lotion Apply 1 application. topically 2 (two) times daily. 118 mL 3   latanoprost (XALATAN) 0.005 % ophthalmic solution Place 1 drop into both eyes at bedtime.     metFORMIN (GLUCOPHAGE-XR) 500 MG 24 hr tablet Take 500 mg by mouth 2 (two) times daily.     metoprolol succinate (TOPROL-XL) 50 MG 24 hr tablet Take 50 mg by mouth daily. Take with or immediately following a meal.     Multiple Vitamin (MULTIVITAMIN) tablet Take 1 tablet by mouth in the morning.     ONETOUCH ULTRA test strip USE 1 STRIP TO CHECK GLUCOSE ONCE DAILY     prochlorperazine (COMPAZINE) 10 MG tablet Take 1 tablet (10 mg total) by mouth every 6 (six) hours as needed for nausea or vomiting. 60 tablet 3   SSD 1 % cream Apply topically daily.     urea (CARMOL) 10 % cream Apply topically 2 (two) times daily. Apply topically to hands twice a day 71 g 2   vitamin C (ASCORBIC ACID) 500 MG tablet Take 500 mg by mouth 2 (two) times daily.      No current facility-administered medications for this visit.    ALLERGIES:  Allergies  Allergen Reactions   Amiodarone Rash    PHYSICAL EXAM:  Performance status (ECOG): 1 - Symptomatic but completely ambulatory  Vitals:   10/27/21 1537  BP: 120/63  Pulse: (!) 54  Resp: 18  Temp: 98.4 F (36.9 C)   SpO2: 97%   Wt Readings from Last 3 Encounters:  10/27/21 209 lb 4.8 oz (94.9 kg)  10/06/21 207 lb (93.9 kg)  09/15/21 209 lb 9.6 oz (95.1 kg)   Physical Exam Vitals  reviewed.  Constitutional:      Appearance: Normal appearance.  Cardiovascular:     Rate and Rhythm: Normal rate and regular rhythm.     Pulses: Normal pulses.     Heart sounds: Normal heart sounds.  Pulmonary:     Effort: Pulmonary effort is normal.     Breath sounds: Normal breath sounds.  Skin:    Findings: Erythema (soles of feet bilaterally) present.     Comments: Skin peeling on soles of feet bilaterally  Neurological:     General: No focal deficit present.     Mental Status: He is alert and oriented to person, place, and time.  Psychiatric:        Mood and Affect: Mood normal.        Behavior: Behavior normal.      LABORATORY DATA:  I have reviewed the labs as listed.     Latest Ref Rng & Units 10/26/2021   11:51 AM 10/06/2021   11:09 AM 09/15/2021    9:06 AM  CBC  WBC 4.0 - 10.5 K/uL 7.0  5.8  6.0   Hemoglobin 13.0 - 17.0 g/dL 12.0  11.9  12.3   Hematocrit 39.0 - 52.0 % 35.3  35.2  36.0   Platelets 150 - 400 K/uL 174  182  175       Latest Ref Rng & Units 10/26/2021   11:51 AM 10/06/2021   11:09 AM 09/15/2021    9:06 AM  CMP  Glucose 70 - 99 mg/dL 135  100  138   BUN 8 - 23 mg/dL '22  22  14   '$ Creatinine 0.61 - 1.24 mg/dL 0.95  1.12  0.78   Sodium 135 - 145 mmol/L 139  137  137   Potassium 3.5 - 5.1 mmol/L 4.1  4.1  3.8   Chloride 98 - 111 mmol/L 106  107  106   CO2 22 - 32 mmol/L '27  25  24   '$ Calcium 8.9 - 10.3 mg/dL 9.8  9.4  9.5   Total Protein 6.5 - 8.1 g/dL 6.9  6.8  7.1   Total Bilirubin 0.3 - 1.2 mg/dL 0.6  0.7  0.5   Alkaline Phos 38 - 126 U/L 79  69  78   AST 15 - 41 U/L 22  32  28   ALT 0 - 44 U/L '22  23  22     '$ DIAGNOSTIC IMAGING:  I have independently reviewed the scans and discussed with the patient. No results found.   ASSESSMENT:  Colon cancer: - Colonoscopy on  10/12/2020 with 7 sessile polyps found in the transverse colon and cecum.  12 mm polyp found in the transverse colon, sessile.  Polyp was removed with piecemeal technique using cold snare.  2 sessile polyps found in the sigmoid colon, removed with cold snare. - Pathology cecal tubular adenoma, sessile serrated polyp of the transverse colon polypectomy, adenocarcinoma arising from a tubular adenoma with high-grade dysplasia of the transverse colon polypectomy.  Sigmoid colon polypectomy showed tubular adenoma, hyperplastic polyp. - Colonoscopy on 02/25/2021 with three 3 to 5 mm polyps in the transverse colon, removed with cold snare.  One 10 mm polyp in the descending colon at 54 cm proximal to the anus. - Pathology on 02/25/2021 shows invasive adenocarcinoma, poorly differentiated, grade 3 of the descending colon polypectomy.  CDX2 positive.  Other findings include transverse colon tubular adenoma and sessile serrated adenoma. - Right hemicolectomy on 05/20/2021. - Pathology shows grade 3  adenocarcinoma, mucinous features, 1.7 cm in the transverse colon, metastatic adenocarcinoma involving 2/29 lymph nodes.  No LVI/perineural invasion.  pT1, PN 1B. - Adjuvant Xeloda 1500 mg 2 weeks on/1 week off started on 07/06/2021.  Cycle 2 on 07/27/2021, 3 tablets twice daily.  Cycle 3 on 08/26/2021, 2 tablets twice daily 2 weeks on/1 week off, dose reduced secondary to HFSR.  Cycle 4 on 09/16/2021.    Social/family history: He lives at home with his wife.  He quit smoking 23 years ago.  He smoked 1 pack/day for 7 years.  He worked in [place] prior to retirement.  He is active and walks 2 miles per day. - Father had prostate cancer.  Brother had lung cancer.  Another brother had colon cancer (? colon), Sister with lung cancer, another sister with cancer, patient unknown.  3.  Prostate cancer: - He was diagnosed with prostate cancer in August 2007, status post seed implants.  His PSA has been undetectable since  then.   PLAN:  Stage III (T1N1B) transverse colon adenocarcinoma: -He started cycle 5 of Xeloda 1000 mg twice daily 2 weeks on/1 week off on 10/08/2021. - On 10/17/2021, he developed erythema, skin peeling of the soles with pain.  His hands also tingle and burn.  But no skin peeling or erythema in the hands. - He reportedly was soaking his feet in the water with Epsom salt as recommended by his podiatrist for his toe infection. - He has developed at least a grade 2/3 hand-foot skin reaction.  I will hold further treatment with Xeloda. - His latest CEA is 10.1.  I will obtain CT CAP to evaluate for any metastatic disease. - RTC after the scans.  2.  Normocytic anemia: - Anemia from myelosuppression.  Hemoglobin is 12.  3.  Hand-foot skin reaction: - He had developed grade 2/3 hand-foot skin reaction with skin peeling, erythema and pain in the soles.  He is unable to do any work at home because of the pain in the soles.  He will continue application of urea twice daily.  I will hold his Xeloda at this time.   Orders placed this encounter:  No orders of the defined types were placed in this encounter.    Derek Jack, MD [place] [phone]   I, Thana Ates, am acting as a scribe for Dr. Derek Jack.  I, Derek Jack MD, have reviewed the above documentation for accuracy and completeness, and I agree with the above.

## 2021-10-28 ENCOUNTER — Other Ambulatory Visit (HOSPITAL_COMMUNITY): Payer: Self-pay

## 2021-10-29 NOTE — Progress Notes (Signed)
Cardiology Office Note  Date: 10/31/2021   ID: [name] [name], DOB 16-Jun-1940, MRN [id number]  PCP:  Caryl Bis, MD  Cardiologist:  Rozann Lesches, MD Electrophysiologist:  None   Chief Complaint  Patient presents with   Cardiac follow-up    History of Present Illness: [name] [name] is an 81 y.o. male last seen in January by Mr. Jorene Minors, I reviewed the note.  Interval records show that he underwent robotic assisted right colectomy back in March, I reviewed discharge summary.  He continues to see oncology for follow-up of colon cancer.  He is here today with his wife for a follow-up visit.  From a cardiac perspective he does not report any angina symptoms.  He has had some intermittent episodes of atrial fibrillation that have been limited, at least 3 since June and all in association with antibiotic use.  I reviewed his current regimen which is stable otherwise from a cardiac perspective.  He remains on Eliquis for stroke prophylaxis.  Past Medical History:  Diagnosis Date   Anemia    Arthritis    CAD (coronary artery disease)    DES to circumflex 12/2017   Diabetes mellitus without complication (HCC)    GERD (gastroesophageal reflux disease)    Glaucoma    Gout    Headache    History of kidney stones    Hypertension    NSTEMI (non-ST elevated myocardial infarction) ([place]) 12/31/2017   Paroxysmal atrial fibrillation (HCC)    Pericardial effusion    Idiopathic, recurrent pericardial effusion s/p pericardial window.   Prostate cancer ([place]) 2007   S/P Seed implants    Supraventricular tachycardia ([place])    Temporal arteritis ([place])     Past Surgical History:  Procedure Laterality Date   BIOPSY  10/12/2020   Procedure: BIOPSY;  Surgeon: [name], MD;  [facility];  Service: Gastroenterology;;   BIOPSY  02/25/2021   Procedure: BIOPSY;  Surgeon: [name], MD;  [facility];  Service: Gastroenterology;;   BIOPSY   04/11/2021   Procedure: BIOPSY;  Surgeon: Irving Copas., MD;  Location: [facility];  Service: Gastroenterology;;   CATARACT EXTRACTION W/PHACO Left 06/07/2015   Procedure: CATARACT EXTRACTION PHACO AND INTRAOCULAR LENS PLACEMENT LEFT EYE CDE=8.00;  Surgeon: Tonny Branch, MD;  Location: [facility];  Service: Ophthalmology;  Laterality: Left;   CATARACT EXTRACTION W/PHACO Right 06/17/2015   Procedure: CATARACT EXTRACTION PHACO AND INTRAOCULAR LENS PLACEMENT RIGHT EYE CDE=11.09;  Surgeon: Tonny Branch, MD;  [facility]: [facility];  Service: Ophthalmology;  Laterality: Right;   COLONOSCOPY WITH PROPOFOL N/A 10/12/2020   Procedure: COLONOSCOPY WITH PROPOFOL;  Surgeon: [name], MD;  [facility];  Service: Gastroenterology;  Laterality: N/A;  10:35   COLONOSCOPY WITH PROPOFOL N/A 02/25/2021   Procedure: COLONOSCOPY WITH PROPOFOL;  Surgeon: [name], MD;  [facility];  Service: Gastroenterology;  Laterality: N/A;  8:10   CORONARY ANGIOPLASTY WITH STENT PLACEMENT  01/01/2018   CORONARY STENT INTERVENTION N/A 01/01/2018   Procedure: CORONARY STENT INTERVENTION;  Surgeon: Jettie Booze, MD;  Location: [facility];  Service: Cardiovascular;  Laterality: N/A;   ENDOSCOPIC MUCOSAL RESECTION N/A 04/11/2021   Procedure: ENDOSCOPIC MUCOSAL RESECTION;  Surgeon: Rush Landmark Telford Nab., MD;  [facility];  Service: Gastroenterology;  Laterality: N/A;   ENTEROSCOPY N/A 10/12/2020   Procedure: PUSH ENTEROSCOPY;  Surgeon: [name], MD;  [facility];  Service: Gastroenterology;  Laterality: N/A;   ENTEROSCOPY N/A 04/11/2021   Procedure: ENTEROSCOPY;  Surgeon: Rush Landmark Telford Nab., MD;  Location: [facility];  Service: Gastroenterology;  Laterality: N/A;   ESOPHAGOGASTRODUODENOSCOPY (EGD) WITH PROPOFOL N/A 10/12/2020   Procedure: ESOPHAGOGASTRODUODENOSCOPY (EGD) WITH PROPOFOL;  Surgeon: [name],  MD;  [facility];  Service: Gastroenterology;  Laterality: N/A;   FRACTURE SURGERY Left 2010   ankle   HEMOSTASIS CLIP PLACEMENT  04/11/2021   Procedure: HEMOSTASIS CLIP PLACEMENT;  Surgeon: Irving Copas., MD;  [facility];  Service: Gastroenterology;;   INSERTION PROSTATE RADIATION SEED  2007   KNEE ARTHROSCOPY Left    LAPAROSCOPY N/A 05/22/2021   Procedure: LAPAROSCOPY DIAGNOSTIC WITH WASHOUT OF ABDOMEN;  Surgeon: Leighton Ruff, MD;  [facility];  Service: General;  Laterality: N/A;   LEFT HEART CATH AND CORONARY ANGIOGRAPHY N/A 01/01/2018   Procedure: LEFT HEART CATH AND CORONARY ANGIOGRAPHY;  Surgeon: Jettie Booze, MD;  Location: [facility];  Service: Cardiovascular;  Laterality: N/A;   ORIF TIBIA & FIBULA FRACTURES Left 2003   Distal tibial/fibula    PERICARDIAL WINDOW  02/2007   PERICARDIOCENTESIS  2007   hx/notes 10/19/2011   POLYPECTOMY  10/12/2020   Procedure: POLYPECTOMY;  Surgeon: [name], MD;  [facility];  Service: Gastroenterology;;  small bowel, cecal   POLYPECTOMY  02/25/2021   Procedure: POLYPECTOMY;  Surgeon: [name], MD;  [facility];  Service: Gastroenterology;;  transverse colon x2   SUBMUCOSAL LIFTING INJECTION  04/11/2021   Procedure: SUBMUCOSAL LIFTING INJECTION;  Surgeon: Irving Copas., MD;  Location[facility];  Service: Gastroenterology;;   SUBMUCOSAL TATTOO INJECTION  04/11/2021   Procedure: SUBMUCOSAL TATTOO INJECTION;  Surgeon: Irving Copas., MD;  [facility];  Service: Gastroenterology;;    Current Outpatient Medications  Medication Sig Dispense Refill   apixaban (ELIQUIS) 5 MG TABS tablet Take 1 tablet (5 mg total) by mouth 2 (two) times daily. 60 tablet 6   atorvastatin (LIPITOR) 80 MG tablet TAKE 1 TABLET BY MOUTH ONCE DAILY AT  6  PM 90 tablet 0   benazepril (LOTENSIN) 40 MG tablet TAKE 1 TABLET BY MOUTH ONCE  DAILY** NEEDS  OFFICE  VISIT 90 tablet 1   capecitabine (XELODA) 500 MG tablet Take 2 tablets (1,000 mg total) by mouth 2 (two) times daily after a meal. Take for 14 days, then hold 7 days. Repeat every 21 days. 56 tablet 0   diltiazem (CARDIZEM CD) 180 MG 24 hr capsule Take 1 capsule (180 mg total) by mouth daily. 30 capsule 1   dorzolamide (TRUSOPT) 2 % ophthalmic solution Place 1 drop into both eyes 2 (two) times daily.     febuxostat (ULORIC) 40 MG tablet Take 1 tablet (40 mg total) by mouth daily. 30 tablet 6   FERREX 150 150 MG capsule Take 150 mg by mouth 2 (two) times daily.     hydrALAZINE (APRESOLINE) 100 MG tablet Take 100 mg by mouth 2 (two) times daily.     hydrochlorothiazide (HYDRODIURIL) 12.5 MG tablet Take 12.5 mg by mouth daily.     latanoprost (XALATAN) 0.005 % ophthalmic solution Place 1 drop into both eyes at bedtime.     metFORMIN (GLUCOPHAGE-XR) 500 MG 24 hr tablet Take 500 mg by mouth 2 (two) times daily.     metoprolol succinate (TOPROL-XL) 50 MG 24 hr tablet Take 50 mg by mouth daily. Take with or immediately following a meal.  Multiple Vitamin (MULTIVITAMIN) tablet Take 1 tablet by mouth in the morning.     ONETOUCH ULTRA test strip USE 1 STRIP TO CHECK GLUCOSE ONCE DAILY     prochlorperazine (COMPAZINE) 10 MG tablet Take 1 tablet (10 mg total) by mouth every 6 (six) hours as needed for nausea or vomiting. 60 tablet 3   SSD 1 % cream Apply topically daily.     urea (CARMOL) 10 % cream Apply topically 2 (two) times daily. Apply topically to hands twice a day 71 g 2   vitamin C (ASCORBIC ACID) 500 MG tablet Take 500 mg by mouth 2 (two) times daily.      No current facility-administered medications for this visit.   Allergies:  Amiodarone   ROS: Chronic hearing loss.  Physical Exam: VS:  BP (!) 108/50   Pulse (!) 53   Ht '5\' 10"'$  (1.778 m)   Wt 209 lb 3.2 oz (94.9 kg)   SpO2 95%   BMI 30.02 kg/m , BMI Body mass index is 30.02 kg/m.  Wt Readings from Last 3  Encounters:  10/31/21 209 lb 3.2 oz (94.9 kg)  10/27/21 209 lb 4.8 oz (94.9 kg)  10/06/21 207 lb (93.9 kg)    General: Patient appears comfortable at rest. HEENT: Conjunctiva and lids normal, oropharynx clear. Neck: Supple, no elevated JVP or carotid bruits, no thyromegaly. Lungs: Clear to auscultation, nonlabored breathing at rest. Cardiac: Regular rate and rhythm, no S3, 2/6 systolic murmur. Extremities: No pitting edema.  ECG:  An ECG dated 05/21/2021 was personally reviewed today and demonstrated:  Sinus rhythm with low voltage, rule out old inferior infarct pattern.  Recent Labwork: 10/26/2021: ALT 22; AST 22; BUN 22; Creatinine, Ser 0.95; Hemoglobin 12.0; Magnesium 1.9; Platelets 174; Potassium 4.1; Sodium 139     Component Value Date/Time   CHOL 142 01/01/2018 0626   TRIG 73 01/01/2018 0626   HDL 34 (L) 01/01/2018 0626   CHOLHDL 4.2 01/01/2018 0626   VLDL 15 01/01/2018 0626   LDLCALC 93 01/01/2018 0626  September 2022: Cholesterol 107, triglycerides 60, HDL 39, LDL 55  Other Studies Reviewed Today:  Echocardiogram 05/21/2021:  1. Left ventricular ejection fraction, by estimation, is 70 to 75%. The  left ventricle has hyperdynamic function. The left ventricle has no  regional wall motion abnormalities. There is severe concentric left  ventricular hypertrophy. There is a resting  LVOT gradient of 55mHg. Findings suggestive of HOCM.      Left ventricular diastolic parameters are consistent with Grade I  diastolic dysfunction (impaired relaxation). Elevated left ventricular  end-diastolic pressure.   2. Right ventricular systolic function is normal. The right ventricular  size is normal. Tricuspid regurgitation signal is inadequate for assessing  PA pressure.   3. The mitral valve is normal in structure. No evidence of mitral valve  regurgitation. No evidence of mitral stenosis.   4. The aortic valve is calcified. Aortic valve regurgitation is not  visualized. Aortic valve  sclerosis/calcification is present, without any  evidence of aortic stenosis. Aortic valve area, by VTI measures 3.17 cm.  Aortic valve mean gradient measures  9.0 mmHg. Aortic valve Vmax measures 2.10 m/s.   5. There is a mobile density in the RVOT in the vicinity of the pulmonic  valve. Cannot rule out vegetation vs thrombus by this study.   6. Aortic dilatation noted. There is borderline dilatation of the aortic  root, measuring 37 mm. There is borderline dilatation of the ascending  aorta, measuring 37 mm.  7. The inferior vena cava is normal in size with greater than 50%  respiratory variability, suggesting right atrial pressure of 3 mmHg.   8. A small pericardial effusion is present. The pericardial effusion is  circumferential. There is no evidence of cardiac tamponade.   9. Consider Chest CTA to rule out PE an if negative consider TEE to  assess RVOT and PV further given mobile target if endocarditis if a  concern.   Addendum: Asked to review study by ordering team regarding findings in region of the  RVOT and pulmonic valve. There is bubble contrast in this region (likely  from peripheral IV) and most likely the mobile density is secondary to  artifact. Images that follow in the   absence of bubble contrast do not show a consistent abnormality to  suggest definitive thrombus or vegatation. Pericardial effusion is  moderate posteriorly and small anteriorly.    Cardiac catheterization 01/01/2018: Mid Cx lesion is 95% stenosed. Prox LAD lesion is 25% stenosed. Mid LAD lesion is 30% stenosed. Prox RCA to Mid RCA lesion is 50% stenosed. The left ventricular systolic function is normal. LV end diastolic pressure is normal. LVEDP 12 mm Hg. The left ventricular ejection fraction is 50-55% by visual estimate. There is no aortic valve stenosis. Severe tortuosity in the right subclavian which makes torquing catheters difficult. Would consider femoral approach in the future,  particularly if emergent cardiac cath was needed. A drug-eluting stent was successfully placed using a STENT SIERRA 3.25 X 15 MM. Post intervention, there is a 0% residual stenosis.  Assessment and Plan:  1.  Paroxysmal atrial fibrillation with CHA2DS2-VASc score of 3.  He is in sinus rhythm today on examination and continues on combination of Cardizem CD and Toprol-XL.  Intermittent atrial fibrillation noted based on symptom review, but relatively short-lived.  Continue Eliquis for stroke prophylaxis.  I reviewed his interval lab work.  2.  CAD status post DES to the circumflex in 2019, no active angina symptoms.  Continue Lipitor, currently not on aspirin given use of Eliquis.  3.  Mixed hyperlipidemia, tolerating Lipitor with last LDL 55.  Medication Adjustments/Labs and Tests Ordered: Current medicines are reviewed at length with the patient today.  Concerns regarding medicines are outlined above.   Tests Ordered: No orders of the defined types were placed in this encounter.   Medication Changes: No orders of the defined types were placed in this encounter.   Disposition:  Follow up  6 months.  Signed, Satira Sark, MD, [facility] 10/31/2021 11:42 AM    [facility] [place], [place], [place] [address] Phone: [phone]; Fax: [phone]

## 2021-10-31 ENCOUNTER — Encounter: Payer: Self-pay | Admitting: Cardiology

## 2021-10-31 ENCOUNTER — Ambulatory Visit: Payer: Medicare HMO | Admitting: Cardiology

## 2021-10-31 VITALS — BP 108/50 | HR 53 | Ht 70.0 in | Wt 209.2 lb

## 2021-10-31 DIAGNOSIS — M79674 Pain in right toe(s): Secondary | ICD-10-CM | POA: Diagnosis not present

## 2021-10-31 DIAGNOSIS — I25119 Atherosclerotic heart disease of native coronary artery with unspecified angina pectoris: Secondary | ICD-10-CM

## 2021-10-31 DIAGNOSIS — I480 Paroxysmal atrial fibrillation: Secondary | ICD-10-CM

## 2021-10-31 DIAGNOSIS — M79671 Pain in right foot: Secondary | ICD-10-CM | POA: Diagnosis not present

## 2021-10-31 DIAGNOSIS — E782 Mixed hyperlipidemia: Secondary | ICD-10-CM | POA: Diagnosis not present

## 2021-10-31 DIAGNOSIS — L03031 Cellulitis of right toe: Secondary | ICD-10-CM | POA: Diagnosis not present

## 2021-10-31 DIAGNOSIS — L600 Ingrowing nail: Secondary | ICD-10-CM | POA: Diagnosis not present

## 2021-10-31 NOTE — Patient Instructions (Signed)
Medication Instructions:  Your physician recommends that you continue on your current medications as directed. Please refer to the Current Medication list given to you today.   Labwork: None  Testing/Procedures: none  Follow-Up:  Your physician recommends that you schedule a follow-up appointment in: 6 months  Any Other Special Instructions Will Be Listed Below (If Applicable).  If you need a refill on your cardiac medications before your next appointment, please call your pharmacy.

## 2021-11-08 ENCOUNTER — Other Ambulatory Visit (HOSPITAL_COMMUNITY): Payer: Self-pay | Admitting: Hematology

## 2021-11-08 ENCOUNTER — Ambulatory Visit (HOSPITAL_COMMUNITY)
Admission: RE | Admit: 2021-11-08 | Discharge: 2021-11-08 | Disposition: A | Discharge status: Home / Self Care | Payer: Medicare HMO | Source: Ambulatory Visit | Attending: Hematology | Admitting: Hematology

## 2021-11-08 DIAGNOSIS — J90 Pleural effusion, not elsewhere classified: Secondary | ICD-10-CM | POA: Diagnosis not present

## 2021-11-08 DIAGNOSIS — C184 Malignant neoplasm of transverse colon: Secondary | ICD-10-CM | POA: Diagnosis not present

## 2021-11-08 DIAGNOSIS — N200 Calculus of kidney: Secondary | ICD-10-CM | POA: Diagnosis not present

## 2021-11-08 DIAGNOSIS — I700 Atherosclerosis of aorta: Secondary | ICD-10-CM | POA: Diagnosis not present

## 2021-11-08 DIAGNOSIS — K449 Diaphragmatic hernia without obstruction or gangrene: Secondary | ICD-10-CM | POA: Diagnosis not present

## 2021-11-08 DIAGNOSIS — N281 Cyst of kidney, acquired: Secondary | ICD-10-CM | POA: Diagnosis not present

## 2021-11-08 DIAGNOSIS — Z8546 Personal history of malignant neoplasm of prostate: Secondary | ICD-10-CM | POA: Diagnosis not present

## 2021-11-08 DIAGNOSIS — I3139 Other pericardial effusion (noninflammatory): Secondary | ICD-10-CM | POA: Diagnosis not present

## 2021-11-08 DIAGNOSIS — K862 Cyst of pancreas: Secondary | ICD-10-CM | POA: Diagnosis not present

## 2021-11-08 MED ORDER — IOHEXOL 300 MG/ML  SOLN
100.0000 mL | Freq: Once | INTRAMUSCULAR | Status: AC | PRN
  Administered 2021-11-08: 100 mL via INTRAVENOUS

## 2021-11-11 ENCOUNTER — Other Ambulatory Visit (HOSPITAL_COMMUNITY): Payer: Self-pay

## 2021-11-14 ENCOUNTER — Inpatient Hospital Stay: Payer: Medicare HMO | Admitting: Hematology

## 2021-11-14 ENCOUNTER — Other Ambulatory Visit (HOSPITAL_COMMUNITY): Payer: Self-pay

## 2021-11-14 VITALS — BP 135/59 | HR 57 | Temp 97.4°F | Resp 18 | Ht 71.5 in | Wt 210.8 lb

## 2021-11-14 DIAGNOSIS — Z79899 Other long term (current) drug therapy: Secondary | ICD-10-CM | POA: Diagnosis not present

## 2021-11-14 DIAGNOSIS — Z809 Family history of malignant neoplasm, unspecified: Secondary | ICD-10-CM | POA: Diagnosis not present

## 2021-11-14 DIAGNOSIS — C180 Malignant neoplasm of cecum: Secondary | ICD-10-CM | POA: Diagnosis not present

## 2021-11-14 DIAGNOSIS — C184 Malignant neoplasm of transverse colon: Secondary | ICD-10-CM

## 2021-11-14 DIAGNOSIS — Z87891 Personal history of nicotine dependence: Secondary | ICD-10-CM | POA: Diagnosis not present

## 2021-11-14 DIAGNOSIS — Z801 Family history of malignant neoplasm of trachea, bronchus and lung: Secondary | ICD-10-CM | POA: Diagnosis not present

## 2021-11-14 DIAGNOSIS — Z8042 Family history of malignant neoplasm of prostate: Secondary | ICD-10-CM | POA: Diagnosis not present

## 2021-11-14 DIAGNOSIS — Z800 Family history of malignant neoplasm of digestive organs: Secondary | ICD-10-CM | POA: Diagnosis not present

## 2021-11-14 DIAGNOSIS — Z8546 Personal history of malignant neoplasm of prostate: Secondary | ICD-10-CM | POA: Diagnosis not present

## 2021-11-14 NOTE — Patient Instructions (Addendum)
[place] at [facility] Discharge Instructions  You were seen and examined today by Dr. Delton Coombes.  Dr. Delton Coombes discussed your most recent lab work and CT scan which revealed lymphadenopathy in the lining of your stomach, concerning for spread of cancer to your lymph nodes.  Dr. Delton Coombes has recommended investigating the possible lymph node involvement further with a PET scan. A PET scan is a whole body CT scan that illuminates where there is cancer present in the body.  Follow-up as scheduled.  Thank you for choosing [place] at [facility] to provide your oncology and hematology care.  To afford each patient quality time with our provider, please arrive at least 15 minutes before your scheduled appointment time.   If you have a lab appointment with the [facility] please come in thru the Main Entrance and check in at the main information desk.  You need to re-schedule your appointment should you arrive 10 or more minutes late.  We strive to give you quality time with our providers, and arriving late affects you and other patients whose appointments are after yours.  Also, if you no show three or more times for appointments you may be dismissed from the clinic at the providers discretion.     Again, thank you for choosing [facility].  Our hope is that these requests will decrease the amount of time that you wait before being seen by our physicians.       _____________________________________________________________  Should you have questions after your visit to [facility], please contact our office at [phone] and follow the prompts.  Our office hours are 8:00 a.m. and 4:30 p.m. Monday - Friday.  Please note that voicemails left after 4:00 p.m. may not be returned until the following business day.  We are closed weekends and major holidays.  You do have access to a nurse 24-7, just call the main number to  the clinic [phone] and do not press any options, hold on the line and a nurse will answer the phone.    For prescription refill requests, have your pharmacy contact our office and allow 72 hours.

## 2021-11-14 NOTE — Progress Notes (Signed)
[place], [place] [address]   CLINIC:  Medical Oncology/Hematology  PCP:  Caryl Bis, MD [address] [place] [address] [phone]   REASON FOR VISIT:  Follow-up for colon cancer  PRIOR THERAPY: none  NGS Results: not done  CURRENT THERAPY: surveillance  CANCER STAGING:  Cancer Staging  Colon cancer [facility]) Staging form: Colon and Rectum, AJCC 8th Edition - Clinical stage from 03/22/2021: Stage I (cT1, cN0, cM0) - Unsigned - Pathologic stage from 06/23/2021: Stage IIIA (pT1, pN1b, cM0) - Unsigned   INTERVAL HISTORY:  [name], a 81 y.o. male, seen for follow-up of colon cancer.  He reports skin peeling on the palms and soles.  Does not report any pain in the extremities.  Appetite is 100%.  Energy levels are 80%.  No abdominal pain reported.  REVIEW OF SYSTEMS:  Review of Systems  Constitutional:  Negative for appetite change and fatigue.  HENT:   Negative for mouth sores.   Respiratory:  Negative for cough.   Gastrointestinal:  Negative for nausea.  Musculoskeletal:  Negative for arthralgias and gait problem.  Neurological:  Negative for gait problem and numbness.  Psychiatric/Behavioral:  Positive for depression. The patient is nervous/anxious.   All other systems reviewed and are negative.   PAST MEDICAL/SURGICAL HISTORY:  Past Medical History:  Diagnosis Date   Anemia    Arthritis    CAD (coronary artery disease)    DES to circumflex 12/2017   Diabetes mellitus without complication (HCC)    GERD (gastroesophageal reflux disease)    Glaucoma    Gout    Headache    History of kidney stones    Hypertension    NSTEMI (non-ST elevated myocardial infarction) ([facility]) 12/31/2017   Paroxysmal atrial fibrillation (HCC)    Pericardial effusion    Idiopathic, recurrent pericardial effusion s/p pericardial window.   Prostate cancer ([facility]) 2007   S/P Seed implants    Supraventricular tachycardia ([facility])     Temporal arteritis ([facility])    Past Surgical History:  Procedure Laterality Date   BIOPSY  10/12/2020   Procedure: BIOPSY;  Surgeon: [name], MD;  [facility];  Service: Gastroenterology;;   BIOPSY  02/25/2021   Procedure: BIOPSY;  Surgeon: [name], MD;  [facility];  Service: Gastroenterology;;   BIOPSY  04/11/2021   Procedure: BIOPSY;  Surgeon: Irving Copas., MD;  Location: [facility];  Service: Gastroenterology;;   CATARACT EXTRACTION W/PHACO Left 06/07/2015   Procedure: CATARACT EXTRACTION PHACO AND INTRAOCULAR LENS PLACEMENT LEFT EYE CDE=8.00;  Surgeon: Tonny Branch, MD;  Location: [facility];  Service: Ophthalmology;  Laterality: Left;   CATARACT EXTRACTION W/PHACO Right 06/17/2015   Procedure: CATARACT EXTRACTION PHACO AND INTRAOCULAR LENS PLACEMENT RIGHT EYE CDE=11.09;  Surgeon: Tonny Branch, MD;  [facility]: [facility];  Service: Ophthalmology;  Laterality: Right;   COLONOSCOPY WITH PROPOFOL N/A 10/12/2020   Procedure: COLONOSCOPY WITH PROPOFOL;  Surgeon: [name], MD;  [facility];  Service: Gastroenterology;  Laterality: N/A;  10:35   COLONOSCOPY WITH PROPOFOL N/A 02/25/2021   Procedure: COLONOSCOPY WITH PROPOFOL;  Surgeon: [name], MD;  [facility];  Service: Gastroenterology;  Laterality: N/A;  8:10   CORONARY ANGIOPLASTY WITH STENT PLACEMENT  01/01/2018   CORONARY STENT INTERVENTION N/A 01/01/2018   Procedure: CORONARY STENT INTERVENTION;  Surgeon: Jettie Booze, MD;  Location: [facility];  Service:  Cardiovascular;  Laterality: N/A;   ENDOSCOPIC MUCOSAL RESECTION N/A 04/11/2021   Procedure: ENDOSCOPIC MUCOSAL RESECTION;  Surgeon: Rush Landmark Telford Nab., MD;  [facility];  Service: Gastroenterology;  Laterality: N/A;   ENTEROSCOPY N/A 10/12/2020   Procedure: PUSH ENTEROSCOPY;  Surgeon: [name], MD;  [facility];   Service: Gastroenterology;  Laterality: N/A;   ENTEROSCOPY N/A 04/11/2021   Procedure: ENTEROSCOPY;  Surgeon: Rush Landmark Telford Nab., MD;  Location: [facility];  Service: Gastroenterology;  Laterality: N/A;   ESOPHAGOGASTRODUODENOSCOPY (EGD) WITH PROPOFOL N/A 10/12/2020   Procedure: ESOPHAGOGASTRODUODENOSCOPY (EGD) WITH PROPOFOL;  Surgeon: [name], MD;  [facility];  Service: Gastroenterology;  Laterality: N/A;   FRACTURE SURGERY Left 2010   ankle   HEMOSTASIS CLIP PLACEMENT  04/11/2021   Procedure: HEMOSTASIS CLIP PLACEMENT;  Surgeon: Irving Copas., MD;  [facility];  Service: Gastroenterology;;   INSERTION PROSTATE RADIATION SEED  2007   KNEE ARTHROSCOPY Left    LAPAROSCOPY N/A 05/22/2021   Procedure: LAPAROSCOPY DIAGNOSTIC WITH WASHOUT OF ABDOMEN;  Surgeon: Leighton Ruff, MD;  Location: [facility];  Service: General;  Laterality: N/A;   LEFT HEART CATH AND CORONARY ANGIOGRAPHY N/A 01/01/2018   Procedure: LEFT HEART CATH AND CORONARY ANGIOGRAPHY;  Surgeon: Jettie Booze, MD;  Location: [facility];  Service: Cardiovascular;  Laterality: N/A;   ORIF TIBIA & FIBULA FRACTURES Left 2003   Distal tibial/fibula    PERICARDIAL WINDOW  02/2007   PERICARDIOCENTESIS  2007   hx/notes 10/19/2011   POLYPECTOMY  10/12/2020   Procedure: POLYPECTOMY;  Surgeon: [name], MD;  [facility];  Service: Gastroenterology;;  small bowel, cecal   POLYPECTOMY  02/25/2021   Procedure: POLYPECTOMY;  Surgeon: [name], MD;  [facility];  Service: Gastroenterology;;  transverse colon x2   SUBMUCOSAL LIFTING INJECTION  04/11/2021   Procedure: SUBMUCOSAL LIFTING INJECTION;  Surgeon: Irving Copas., MD;  Location: [facility];  Service: Gastroenterology;;   SUBMUCOSAL TATTOO INJECTION  04/11/2021   Procedure: SUBMUCOSAL TATTOO INJECTION;  Surgeon: Irving Copas., MD;  Location: [facility];  Service: Gastroenterology;;    SOCIAL HISTORY:  Social History   Socioeconomic History   Marital status: Married    Spouse name: Not on file   Number of children: Not on file   Years of education: Not on file   Highest education level: Not on file  Occupational History   Not on file  Tobacco Use   Smoking status: Former    Packs/day: 0.30    Years: 10.00    Total pack years: 3.00    Types: Cigarettes    Start date: 08/06/1958    Quit date: 1968    Years since quitting: 55.6   Smokeless tobacco: Never  Vaping Use   Vaping Use: Never used  Substance and Sexual Activity   Alcohol use: Not Currently   Drug use: Never   Sexual activity: Yes  Other Topics Concern   Not on file  Social History Narrative   Not on file   Social Determinants of Health   Financial Resource Strain: Not on file  Food Insecurity: Not on file  Transportation Needs: Not on file  Physical Activity: Not on file  Stress: Not on file  Social Connections: Not on file  Intimate Partner Violence: Not on file    FAMILY HISTORY:  Family History  Problem Relation Age of Onset   CAD Mother  MI in 30s   Prostate cancer Father    Prostate cancer Brother    Lung cancer Brother     CURRENT MEDICATIONS:  Current Outpatient Medications  Medication Sig Dispense Refill   apixaban (ELIQUIS) 5 MG TABS tablet Take 1 tablet (5 mg total) by mouth 2 (two) times daily. 60 tablet 6   atorvastatin (LIPITOR) 80 MG tablet TAKE 1 TABLET BY MOUTH ONCE DAILY AT  6  PM 90 tablet 0   benazepril (LOTENSIN) 40 MG tablet TAKE 1 TABLET BY MOUTH ONCE DAILY** NEEDS  OFFICE  VISIT 90 tablet 1   capecitabine (XELODA) 500 MG tablet Take 2 tablets (1,000 mg total) by mouth 2 (two) times daily after a meal. Take for 14 days, then hold 7 days. Repeat every 21 days. 56 tablet 0   diltiazem (CARDIZEM CD) 180 MG 24 hr capsule Take 1 capsule (180 mg total) by mouth daily. 30 capsule 1   dorzolamide (TRUSOPT) 2 %  ophthalmic solution Place 1 drop into both eyes 2 (two) times daily.     febuxostat (ULORIC) 40 MG tablet Take 1 tablet (40 mg total) by mouth daily. 30 tablet 6   FERREX 150 150 MG capsule Take 150 mg by mouth 2 (two) times daily.     hydrALAZINE (APRESOLINE) 100 MG tablet Take 100 mg by mouth 2 (two) times daily.     hydrochlorothiazide (HYDRODIURIL) 12.5 MG tablet Take 12.5 mg by mouth daily.     latanoprost (XALATAN) 0.005 % ophthalmic solution Place 1 drop into both eyes at bedtime.     metFORMIN (GLUCOPHAGE-XR) 500 MG 24 hr tablet Take 500 mg by mouth 2 (two) times daily.     metoprolol succinate (TOPROL-XL) 50 MG 24 hr tablet Take 50 mg by mouth daily. Take with or immediately following a meal.     Multiple Vitamin (MULTIVITAMIN) tablet Take 1 tablet by mouth in the morning.     ONETOUCH ULTRA test strip USE 1 STRIP TO CHECK GLUCOSE ONCE DAILY     prochlorperazine (COMPAZINE) 10 MG tablet Take 1 tablet (10 mg total) by mouth every 6 (six) hours as needed for nausea or vomiting. 60 tablet 3   SSD 1 % cream Apply topically daily.     urea (CARMOL) 10 % cream Apply topically 2 (two) times daily. Apply topically to hands twice a day 71 g 2   vitamin C (ASCORBIC ACID) 500 MG tablet Take 500 mg by mouth 2 (two) times daily.      No current facility-administered medications for this visit.    ALLERGIES:  Allergies  Allergen Reactions   Amiodarone Rash    PHYSICAL EXAM:  Performance status (ECOG): 1 - Symptomatic but completely ambulatory  Vitals:   11/14/21 1312  BP: (!) 135/59  Pulse: (!) 57  Resp: 18  Temp: (!) 97.4 F (36.3 C)  SpO2: 99%   Wt Readings from Last 3 Encounters:  11/14/21 210 lb 12.8 oz (95.6 kg)  10/31/21 209 lb 3.2 oz (94.9 kg)  10/27/21 209 lb 4.8 oz (94.9 kg)   Physical Exam Vitals reviewed.  Constitutional:      Appearance: Normal appearance.  Cardiovascular:     Rate and Rhythm: Normal rate and regular rhythm.     Pulses: Normal pulses.     Heart  sounds: Normal heart sounds.  Pulmonary:     Effort: Pulmonary effort is normal.     Breath sounds: Normal breath sounds.  Skin:    Findings: Erythema (  soles of feet bilaterally) present.     Comments: Skin peeling on soles of feet bilaterally  Neurological:     General: No focal deficit present.     Mental Status: He is alert and oriented to person, place, and time.  Psychiatric:        Mood and Affect: Mood normal.        Behavior: Behavior normal.      LABORATORY DATA:  I have reviewed the labs as listed.     Latest Ref Rng & Units 10/26/2021   11:51 AM 10/06/2021   11:09 AM 09/15/2021    9:06 AM  CBC  WBC 4.0 - 10.5 K/uL 7.0  5.8  6.0   Hemoglobin 13.0 - 17.0 g/dL 12.0  11.9  12.3   Hematocrit 39.0 - 52.0 % 35.3  35.2  36.0   Platelets 150 - 400 K/uL 174  182  175       Latest Ref Rng & Units 10/26/2021   11:51 AM 10/06/2021   11:09 AM 09/15/2021    9:06 AM  CMP  Glucose 70 - 99 mg/dL 135  100  138   BUN 8 - 23 mg/dL '22  22  14   '$ Creatinine 0.61 - 1.24 mg/dL 0.95  1.12  0.78   Sodium 135 - 145 mmol/L 139  137  137   Potassium 3.5 - 5.1 mmol/L 4.1  4.1  3.8   Chloride 98 - 111 mmol/L 106  107  106   CO2 22 - 32 mmol/L '27  25  24   '$ Calcium 8.9 - 10.3 mg/dL 9.8  9.4  9.5   Total Protein 6.5 - 8.1 g/dL 6.9  6.8  7.1   Total Bilirubin 0.3 - 1.2 mg/dL 0.6  0.7  0.5   Alkaline Phos 38 - 126 U/L 79  69  78   AST 15 - 41 U/L 22  32  28   ALT 0 - 44 U/L '22  23  22     '$ DIAGNOSTIC IMAGING:  I have independently reviewed the scans and discussed with the patient. CT CHEST ABDOMEN PELVIS W CONTRAST  Result Date: 11/09/2021 CLINICAL DATA:  Colon cancer stage 2-3. Status post chemotherapy. Prostate cancer with seed implants. * Tracking Code: BO * EXAM: CT CHEST, ABDOMEN, AND PELVIS WITH CONTRAST TECHNIQUE: Multidetector CT imaging of the chest, abdomen and pelvis was performed following the standard protocol during bolus administration of intravenous contrast. RADIATION DOSE  REDUCTION: This exam was performed according to the departmental dose-optimization program which includes automated exposure control, adjustment of the mA and/or kV according to patient size and/or use of iterative reconstruction technique. CONTRAST:  137m OMNIPAQUE IOHEXOL 300 MG/ML  SOLN COMPARISON:  05/21/2021 postoperative abdominopelvic CT. 03/18/2021 staging chest abdomen and pelvic CT. FINDINGS: CT CHEST FINDINGS Cardiovascular: Aortic atherosclerosis. Mild cardiomegaly with small pericardial effusion, similar. Left main and 3 vessel coronary artery calcification. No central pulmonary embolism, on this non-dedicated study. Mediastinum/Nodes: No mediastinal or hilar adenopathy. Tiny hiatal hernia. Lungs/Pleura: trace right pleural fluid. 4 mm inferior lateral right middle lobe subpleural pulmonary nodule on 100/4 is similar to the prior. A vague 3 mm right lower lobe pulmonary nodule on 73/4 is also not significantly changed. The nodule of interest on the prior exam is similar at 5 mm in the subpleural left lower lobe on 108/4. Tiny subpleural superior segment left lower lobe pulmonary nodules x2 including at up to 3 mm on 64/4 unchanged. Musculoskeletal: No acute osseous  abnormality. CT ABDOMEN PELVIS FINDINGS Hepatobiliary: Small bilateral liver lesions are similar in size and distribution. Many of these are too small to characterize. The larger lesions, for example in the posterior aspect of segment 2 at 1.8 cm are consistent with cysts. No enlarging suspicious liver lesion identified. Normal gallbladder, without biliary ductal dilatation. Pancreas: Cystic lesion within the pancreatic neck measures 11 mm on 63/2 is felt to be more conspicuous than on the prior. No duct dilatation or acute inflammation. Spleen: Normal in size, without focal abnormality. Adrenals/Urinary Tract: Normal adrenal glands. Punctate interpolar left renal collecting system calculus. Bilateral too small to characterize renal lesions.  Interpolar left renal 1.2 cm lesion measures slightly greater than fluid density on 81/2 and 40/3. Not readily apparent on 03/18/2021. No hydronephrosis. Normal urinary bladder. Stomach/Bowel: Normal remainder of the stomach. Right hemicolectomy. No local recurrence. Prior enterotomy.  Otherwise normal small bowel Vascular/Lymphatic: Advanced aortic and branch vessel atherosclerosis. Ileocolic mesenteric nodes are clustered on 87/2, measuring maximally 1.0 x 1.5 cm, new since the prior CT. No pelvic sidewall adenopathy. Reproductive: Radiation seeds in the prostate. Other: Small bilateral fat containing inguinal hernias. No significant free fluid. Subtle omental thickening including on 75/2 and adjacent to the liver on 57/2. No well-defined omental nodularity. Musculoskeletal: Subcentimeter left sacral sclerotic lesion is unchanged, favoring a bone island. Mild osteopenia. IMPRESSION: CT CHEST IMPRESSION 1.  No acute process or evidence of metastatic disease in the chest. 2. Bilateral pulmonary nodules, including the left lower lobe nodule of interest, are unchanged, favored to be benign. 3. Coronary artery atherosclerosis. Aortic Atherosclerosis (ICD10-I70.0). Similar small pericardial effusion. 4. Trace right pleural fluid. CT ABDOMEN AND PELVIS IMPRESSION 1. Status post right hemicolectomy. Developing adenopathy in the ileocolic mesentery, highly suspicious for nodal metastasis. 2. Subtle interstitial thickening within the omentum could be postoperative or represent early peritoneal metastasis. Recommend attention on follow-up. 3. Developing left renal and pancreatic cystic lesions are of doubtful clinical significance, given comorbidities. Recommend attention on follow-up. 4. Left nephrolithiasis. Electronically Signed   By: Abigail Miyamoto M.D.   On: 11/09/2021 14:59     ASSESSMENT:  Colon cancer: - Colonoscopy on 10/12/2020 with 7 sessile polyps found in the transverse colon and cecum.  12 mm polyp found in  the transverse colon, sessile.  Polyp was removed with piecemeal technique using cold snare.  2 sessile polyps found in the sigmoid colon, removed with cold snare. - Pathology cecal tubular adenoma, sessile serrated polyp of the transverse colon polypectomy, adenocarcinoma arising from a tubular adenoma with high-grade dysplasia of the transverse colon polypectomy.  Sigmoid colon polypectomy showed tubular adenoma, hyperplastic polyp. - Colonoscopy on 02/25/2021 with three 3 to 5 mm polyps in the transverse colon, removed with cold snare.  One 10 mm polyp in the descending colon at 54 cm proximal to the anus. - Pathology on 02/25/2021 shows invasive adenocarcinoma, poorly differentiated, grade 3 of the descending colon polypectomy.  CDX2 positive.  Other findings include transverse colon tubular adenoma and sessile serrated adenoma. - Right hemicolectomy on 05/20/2021. - Pathology shows grade 3 adenocarcinoma, mucinous features, 1.7 cm in the transverse colon, metastatic adenocarcinoma involving 2/29 lymph nodes.  No LVI/perineural invasion.  pT1, PN 1B. - Adjuvant Xeloda 1500 mg 2 weeks on/1 week off started on 07/06/2021.  Cycle 2 on 07/27/2021, 3 tablets twice daily.  Cycle 3 on 08/26/2021, 2 tablets twice daily 2 weeks on/1 week off, dose reduced secondary to HFSR.  Cycle 4 on 09/16/2021.  Cycle 5 Xeloda  1000 mg twice daily on 10/08/2021.    Social/family history: He lives at home with his wife.  He quit smoking 23 years ago.  He smoked 1 pack/day for 7 years.  He worked in [place] prior to retirement.  He is active and walks 2 miles per day. - Father had prostate cancer.  Brother had lung cancer.  Another brother had colon cancer (? colon), Sister with lung cancer, another sister with cancer, patient unknown.  3.  Prostate cancer: - He was diagnosed with prostate cancer in August 2007, status post seed implants.  His PSA has been undetectable since then.   PLAN:  Stage III (T1N1B) transverse colon  adenocarcinoma: - He has completed 5 cycles of Xeloda. - Last CEA was 10.1.  We reviewed CT CAP from 11/08/2021.  It showed developing adenopathy in the ileocolic mesentery.  Subtle interstitial thickening within the omentum postoperative versus early peritoneal metastasis.  We will hold off Xeloda at this time.  I will order a PET scan.  RTC after PET scan.  2.  Normocytic anemia: - Anemia from myelosuppression.  Hemoglobin is stable at 12.  3.  Hand-foot skin reaction: - He developed grade 2/3 hand-foot skin reaction with skin peeling, erythema and pain in the soles.  Right now he does not have any pain but has skin peeling.  Continue to hold Xeloda.  Continue using urea cream 10% twice daily.   Orders placed this encounter:  Orders Placed This Encounter  Procedures   NM PET Image Initial (PI) Skull Base To Thigh      Derek Jack, MD [place] [phone]

## 2021-11-15 ENCOUNTER — Other Ambulatory Visit (HOSPITAL_COMMUNITY): Payer: Self-pay

## 2021-11-19 ENCOUNTER — Inpatient Hospital Stay (HOSPITAL_COMMUNITY)
Admission: EM | Admit: 2021-11-19 | Discharge: 2021-11-21 | DRG: 602 | Disposition: A | Discharge status: Home / Self Care | Payer: Medicare HMO | Attending: Internal Medicine | Admitting: Internal Medicine

## 2021-11-19 ENCOUNTER — Emergency Department (HOSPITAL_COMMUNITY): Payer: Medicare HMO

## 2021-11-19 ENCOUNTER — Encounter (HOSPITAL_COMMUNITY): Payer: Self-pay

## 2021-11-19 ENCOUNTER — Other Ambulatory Visit: Payer: Self-pay

## 2021-11-19 DIAGNOSIS — G9341 Metabolic encephalopathy: Secondary | ICD-10-CM | POA: Diagnosis present

## 2021-11-19 DIAGNOSIS — I2510 Atherosclerotic heart disease of native coronary artery without angina pectoris: Secondary | ICD-10-CM | POA: Diagnosis present

## 2021-11-19 DIAGNOSIS — Z9841 Cataract extraction status, right eye: Secondary | ICD-10-CM

## 2021-11-19 DIAGNOSIS — Z9842 Cataract extraction status, left eye: Secondary | ICD-10-CM | POA: Diagnosis not present

## 2021-11-19 DIAGNOSIS — R7989 Other specified abnormal findings of blood chemistry: Secondary | ICD-10-CM

## 2021-11-19 DIAGNOSIS — C189 Malignant neoplasm of colon, unspecified: Secondary | ICD-10-CM | POA: Diagnosis present

## 2021-11-19 DIAGNOSIS — I10 Essential (primary) hypertension: Secondary | ICD-10-CM | POA: Diagnosis not present

## 2021-11-19 DIAGNOSIS — Z8546 Personal history of malignant neoplasm of prostate: Secondary | ICD-10-CM

## 2021-11-19 DIAGNOSIS — Z87891 Personal history of nicotine dependence: Secondary | ICD-10-CM | POA: Diagnosis not present

## 2021-11-19 DIAGNOSIS — E876 Hypokalemia: Secondary | ICD-10-CM | POA: Diagnosis not present

## 2021-11-19 DIAGNOSIS — D509 Iron deficiency anemia, unspecified: Secondary | ICD-10-CM | POA: Diagnosis not present

## 2021-11-19 DIAGNOSIS — R778 Other specified abnormalities of plasma proteins: Secondary | ICD-10-CM | POA: Diagnosis not present

## 2021-11-19 DIAGNOSIS — K219 Gastro-esophageal reflux disease without esophagitis: Secondary | ICD-10-CM | POA: Diagnosis present

## 2021-11-19 DIAGNOSIS — E1165 Type 2 diabetes mellitus with hyperglycemia: Secondary | ICD-10-CM | POA: Diagnosis not present

## 2021-11-19 DIAGNOSIS — E871 Hypo-osmolality and hyponatremia: Secondary | ICD-10-CM | POA: Diagnosis not present

## 2021-11-19 DIAGNOSIS — Z20822 Contact with and (suspected) exposure to covid-19: Secondary | ICD-10-CM | POA: Diagnosis present

## 2021-11-19 DIAGNOSIS — L03115 Cellulitis of right lower limb: Secondary | ICD-10-CM | POA: Diagnosis not present

## 2021-11-19 DIAGNOSIS — Z961 Presence of intraocular lens: Secondary | ICD-10-CM | POA: Diagnosis not present

## 2021-11-19 DIAGNOSIS — D539 Nutritional anemia, unspecified: Secondary | ICD-10-CM | POA: Diagnosis not present

## 2021-11-19 DIAGNOSIS — R509 Fever, unspecified: Secondary | ICD-10-CM | POA: Diagnosis not present

## 2021-11-19 DIAGNOSIS — I248 Other forms of acute ischemic heart disease: Secondary | ICD-10-CM | POA: Diagnosis not present

## 2021-11-19 DIAGNOSIS — Z8042 Family history of malignant neoplasm of prostate: Secondary | ICD-10-CM

## 2021-11-19 DIAGNOSIS — Z7984 Long term (current) use of oral hypoglycemic drugs: Secondary | ICD-10-CM

## 2021-11-19 DIAGNOSIS — I252 Old myocardial infarction: Secondary | ICD-10-CM

## 2021-11-19 DIAGNOSIS — Z955 Presence of coronary angioplasty implant and graft: Secondary | ICD-10-CM | POA: Diagnosis not present

## 2021-11-19 DIAGNOSIS — I480 Paroxysmal atrial fibrillation: Secondary | ICD-10-CM | POA: Diagnosis not present

## 2021-11-19 DIAGNOSIS — D696 Thrombocytopenia, unspecified: Secondary | ICD-10-CM | POA: Diagnosis not present

## 2021-11-19 DIAGNOSIS — E782 Mixed hyperlipidemia: Secondary | ICD-10-CM

## 2021-11-19 DIAGNOSIS — Z79899 Other long term (current) drug therapy: Secondary | ICD-10-CM

## 2021-11-19 DIAGNOSIS — R4182 Altered mental status, unspecified: Secondary | ICD-10-CM

## 2021-11-19 DIAGNOSIS — Z801 Family history of malignant neoplasm of trachea, bronchus and lung: Secondary | ICD-10-CM

## 2021-11-19 DIAGNOSIS — Z888 Allergy status to other drugs, medicaments and biological substances status: Secondary | ICD-10-CM | POA: Diagnosis not present

## 2021-11-19 DIAGNOSIS — Z7901 Long term (current) use of anticoagulants: Secondary | ICD-10-CM

## 2021-11-19 DIAGNOSIS — Z8249 Family history of ischemic heart disease and other diseases of the circulatory system: Secondary | ICD-10-CM

## 2021-11-19 LAB — URINALYSIS, ROUTINE W REFLEX MICROSCOPIC
Bilirubin Urine: NEGATIVE
Glucose, UA: >=500 mg/dL — AB
Hgb urine dipstick: NEGATIVE
Ketones, ur: NEGATIVE mg/dL
Leukocytes,Ua: NEGATIVE
Nitrite: NEGATIVE
Protein, ur: 100 mg/dL — AB
Specific Gravity, Urine: 1.016 (ref 1.005–1.030)
pH: 6 (ref 5.0–8.0)

## 2021-11-19 LAB — CBC WITH DIFFERENTIAL/PLATELET
Abs Immature Granulocytes: 0.09 10*3/uL — ABNORMAL HIGH (ref 0.00–0.07)
Basophils Absolute: 0 10*3/uL (ref 0.0–0.1)
Basophils Relative: 0 %
Eosinophils Absolute: 0.3 10*3/uL (ref 0.0–0.5)
Eosinophils Relative: 2 %
HCT: 39.9 % (ref 39.0–52.0)
Hemoglobin: 13.4 g/dL (ref 13.0–17.0)
Immature Granulocytes: 1 %
Lymphocytes Relative: 4 %
Lymphs Abs: 0.5 10*3/uL — ABNORMAL LOW (ref 0.7–4.0)
MCH: 33.8 pg (ref 26.0–34.0)
MCHC: 33.6 g/dL (ref 30.0–36.0)
MCV: 100.5 fL — ABNORMAL HIGH (ref 80.0–100.0)
Monocytes Absolute: 0.8 10*3/uL (ref 0.1–1.0)
Monocytes Relative: 7 %
Neutro Abs: 9.2 10*3/uL — ABNORMAL HIGH (ref 1.7–7.7)
Neutrophils Relative %: 86 %
Platelets: 144 10*3/uL — ABNORMAL LOW (ref 150–400)
RBC: 3.97 MIL/uL — ABNORMAL LOW (ref 4.22–5.81)
RDW: 14.2 % (ref 11.5–15.5)
WBC: 10.9 10*3/uL — ABNORMAL HIGH (ref 4.0–10.5)
nRBC: 0 % (ref 0.0–0.2)

## 2021-11-19 LAB — COMPREHENSIVE METABOLIC PANEL
ALT: 22 U/L (ref 0–44)
AST: 26 U/L (ref 15–41)
Albumin: 4.3 g/dL (ref 3.5–5.0)
Alkaline Phosphatase: 76 U/L (ref 38–126)
Anion gap: 10 (ref 5–15)
BUN: 23 mg/dL (ref 8–23)
CO2: 24 mmol/L (ref 22–32)
Calcium: 9.3 mg/dL (ref 8.9–10.3)
Chloride: 100 mmol/L (ref 98–111)
Creatinine, Ser: 1.04 mg/dL (ref 0.61–1.24)
GFR, Estimated: >60 mL/min (ref >60)
Glucose, Bld: 230 mg/dL — ABNORMAL HIGH (ref 70–99)
Potassium: 3.5 mmol/L (ref 3.5–5.1)
Sodium: 134 mmol/L — ABNORMAL LOW (ref 135–145)
Total Bilirubin: 0.8 mg/dL (ref 0.3–1.2)
Total Protein: 7.5 g/dL (ref 6.5–8.1)

## 2021-11-19 LAB — TROPONIN I (HIGH SENSITIVITY)
Troponin I (High Sensitivity): 29 ng/L — ABNORMAL HIGH (ref <18)
Troponin I (High Sensitivity): 52 ng/L — ABNORMAL HIGH (ref <18)

## 2021-11-19 LAB — PROTIME-INR
INR: 1.1 (ref 0.8–1.2)
Prothrombin Time: 13.7 seconds (ref 11.4–15.2)

## 2021-11-19 LAB — LACTIC ACID, PLASMA: Lactic Acid, Venous: 1.4 mmol/L (ref 0.5–1.9)

## 2021-11-19 LAB — SARS CORONAVIRUS 2 BY RT PCR: SARS Coronavirus 2 by RT PCR: NEGATIVE

## 2021-11-19 MED ORDER — ACETAMINOPHEN 325 MG PO TABS: 650.0000 mg | ORAL_TABLET | Freq: Once | ORAL | Status: DC | PRN

## 2021-11-19 MED ORDER — PIPERACILLIN-TAZOBACTAM 3.375 G IVPB 30 MIN
3.3750 g | Freq: Once | INTRAVENOUS | Status: AC
  Administered 2021-11-19: 3.375 g via INTRAVENOUS
  Filled 2021-11-19: qty 50

## 2021-11-19 MED ORDER — VANCOMYCIN HCL 1500 MG/300ML IV SOLN
1500.0000 mg | Freq: Once | INTRAVENOUS | Status: AC
  Administered 2021-11-19: 1500 mg via INTRAVENOUS
  Filled 2021-11-19: qty 300

## 2021-11-19 MED ORDER — SODIUM CHLORIDE 0.9 % IV BOLUS
1000.0000 mL | Freq: Once | INTRAVENOUS | Status: AC
  Administered 2021-11-19: 1000 mL via INTRAVENOUS

## 2021-11-19 MED ORDER — ACETAMINOPHEN 500 MG PO TABS
1000.0000 mg | ORAL_TABLET | Freq: Once | ORAL | Status: AC
  Administered 2021-11-20: 1000 mg via ORAL
  Filled 2021-11-19: qty 2

## 2021-11-19 NOTE — H&P (Signed)
History and Physical    Patient: [name] [name] YCX:[id number] DOB: [birth year] DOA: 11/19/2021 DOS: the patient was seen and examined on 11/20/2021 PCP: Caryl Bis, MD  Patient coming from: Home  Chief Complaint:  Chief Complaint  Patient presents with   Code Sepsis   HPI: [name] [name] is a 81 y.o. male with medical history significant of stage III (T1N1B) transverse colon adenocarcinoma, CAD, paroxysmal atrial fibrillation on Eliquis, hypertension, hyperlipidemia who presents to the emergency department via EMS due to altered mental status which started today at home.  Patient was unable to provide history, history was obtained from ED physician and ED medical record as well as from wife at bedside.  Per wife, patient developed fever of 101.25F yesterday, Tylenol was given and the fever subsided.  Patient became confused this evening whereby he started wandering around the house, he went to the bathroom to urinate, but ended up urinating in his pants and he required help to be able to return to the living room due to confusion.  Since this is unusual in patient, EMS was activated and patient was sent to the ED for further evaluation and management. Patient was undergoing chemotherapy for colon cancer, but this was stopped due to potential side effects.  ED Course:  In the emergency department, patient was febrile with a temperature of 103.8, respiratory rate was 22/min, pulse 82 bpm, BP 163/61, O2 sat 99%.  Work-up in the ED showed macrocytic anemia and thrombocytopenia.  BMP was normal except for sodium of 134 and CBG 230.  Troponin x 2 -29 > 52, lactic acid 1.4, urinalysis was unimpressive for UTI.  SARS coronavirus 2 was negative.  Blood culture pending. CT head without contrast showed normal Head CT Chest x-ray showed no acute disease Patient was treated with Tylenol, and was empirically started on IV vancomycin and Zosyn, IV hydration was provided.  Hospitalist was asked to admit patient  for further evaluation and management.   Review of Systems: Review of systems as noted in the HPI. All other systems reviewed and are negative.   Past Medical History:  Diagnosis Date   Anemia    Arthritis    CAD (coronary artery disease)    DES to circumflex 12/2017   Diabetes mellitus without complication (HCC)    GERD (gastroesophageal reflux disease)    Glaucoma    Gout    Headache    History of kidney stones    Hypertension    NSTEMI (non-ST elevated myocardial infarction) ([place]) 12/31/2017   Paroxysmal atrial fibrillation (HCC)    Pericardial effusion    Idiopathic, recurrent pericardial effusion s/p pericardial window.   Prostate cancer ([place]) 2007   S/P Seed implants    Supraventricular tachycardia ([place])    Temporal arteritis ([employer])    Past Surgical History:  Procedure Laterality Date   BIOPSY  10/12/2020   Procedure: BIOPSY;  Surgeon: [name], MD;  [facility];  Service: Gastroenterology;;   BIOPSY  02/25/2021   Procedure: BIOPSY;  Surgeon: [name], MD;  [facility];  Service: Gastroenterology;;   BIOPSY  04/11/2021   Procedure: BIOPSY;  Surgeon: Irving Copas., MD;  Location: [facility];  Service: Gastroenterology;;   CATARACT EXTRACTION W/PHACO Left 06/07/2015   Procedure: CATARACT EXTRACTION PHACO AND INTRAOCULAR LENS PLACEMENT LEFT EYE CDE=8.00;  Surgeon: Tonny Branch, MD;  Location: [facility];  Service: Ophthalmology;  Laterality: Left;   CATARACT EXTRACTION W/PHACO Right 06/17/2015   Procedure:  CATARACT EXTRACTION PHACO AND INTRAOCULAR LENS PLACEMENT RIGHT EYE CDE=11.09;  Surgeon: Tonny Branch, MD;  Location: [facility];  Service: Ophthalmology;  Laterality: Right;   COLONOSCOPY WITH PROPOFOL N/A 10/12/2020   Procedure: COLONOSCOPY WITH PROPOFOL;  Surgeon: [name], MD;  [facility];  Service: Gastroenterology;  Laterality: N/A;  10:35   COLONOSCOPY WITH PROPOFOL N/A 02/25/2021    Procedure: COLONOSCOPY WITH PROPOFOL;  Surgeon: [name], MD;  [facility];  Service: Gastroenterology;  Laterality: N/A;  8:10   CORONARY ANGIOPLASTY WITH STENT PLACEMENT  01/01/2018   CORONARY STENT INTERVENTION N/A 01/01/2018   Procedure: CORONARY STENT INTERVENTION;  Surgeon: Jettie Booze, MD;  Location: [facility];  Service: Cardiovascular;  Laterality: N/A;   ENDOSCOPIC MUCOSAL RESECTION N/A 04/11/2021   Procedure: ENDOSCOPIC MUCOSAL RESECTION;  Surgeon: Rush Landmark Telford Nab., MD;  [facility];  Service: Gastroenterology;  Laterality: N/A;   ENTEROSCOPY N/A 10/12/2020   Procedure: PUSH ENTEROSCOPY;  Surgeon: [name], MD;  [facility];  Service: Gastroenterology;  Laterality: N/A;   ENTEROSCOPY N/A 04/11/2021   Procedure: ENTEROSCOPY;  Surgeon: Rush Landmark Telford Nab., MD;  Location: [facility];  Service: Gastroenterology;  Laterality: N/A;   ESOPHAGOGASTRODUODENOSCOPY (EGD) WITH PROPOFOL N/A 10/12/2020   Procedure: ESOPHAGOGASTRODUODENOSCOPY (EGD) WITH PROPOFOL;  Surgeon: [name], MD;  [facility];  Service: Gastroenterology;  Laterality: N/A;   FRACTURE SURGERY Left 2010   ankle   HEMOSTASIS CLIP PLACEMENT  04/11/2021   Procedure: HEMOSTASIS CLIP PLACEMENT;  Surgeon: Irving Copas., MD;  [facility];  Service: Gastroenterology;;   INSERTION PROSTATE RADIATION SEED  2007   KNEE ARTHROSCOPY Left    LAPAROSCOPY N/A 05/22/2021   Procedure: LAPAROSCOPY DIAGNOSTIC WITH WASHOUT OF ABDOMEN;  Surgeon: Leighton Ruff, MD;  Location: [facility];  Service: General;  Laterality: N/A;   LEFT HEART CATH AND CORONARY ANGIOGRAPHY N/A 01/01/2018   Procedure: LEFT HEART CATH AND CORONARY ANGIOGRAPHY;  Surgeon: Jettie Booze, MD;  Location: [facility];  Service: Cardiovascular;  Laterality: N/A;   ORIF TIBIA & FIBULA FRACTURES Left 2003   Distal tibial/fibula     PERICARDIAL WINDOW  02/2007   PERICARDIOCENTESIS  2007   hx/notes 10/19/2011   POLYPECTOMY  10/12/2020   Procedure: POLYPECTOMY;  Surgeon: [name], MD;  [facility];  Service: Gastroenterology;;  small bowel, cecal   POLYPECTOMY  02/25/2021   Procedure: POLYPECTOMY;  Surgeon: [name], MD;  [facility];  Service: Gastroenterology;;  transverse colon x2   SUBMUCOSAL LIFTING INJECTION  04/11/2021   Procedure: SUBMUCOSAL LIFTING INJECTION;  Surgeon: Irving Copas., MD;  Location: [facility];  Service: Gastroenterology;;   SUBMUCOSAL TATTOO INJECTION  04/11/2021   Procedure: SUBMUCOSAL TATTOO INJECTION;  Surgeon: Irving Copas., MD;  Location: [facility];  Service: Gastroenterology;;    Social History:  reports that he quit smoking about 55 years ago. His smoking use included cigarettes. He started smoking about 63 years ago. He has a 3.00 pack-year smoking history. He has never used smokeless tobacco. He reports that he does not currently use alcohol. He reports that he does not use drugs.   Allergies  Allergen Reactions   Amiodarone Rash    Family History  Problem Relation Age of Onset   CAD Mother        MI in 46s   Prostate cancer Father    Prostate cancer Brother  Lung cancer Brother      Prior to Admission medications   Medication Sig Start Date End Date Taking? Authorizing Provider  apixaban (ELIQUIS) 5 MG TABS tablet Take 1 tablet (5 mg total) by mouth 2 (two) times daily. 04/13/21   Mansouraty, Telford Nab., MD  atorvastatin (LIPITOR) 80 MG tablet TAKE 1 TABLET BY MOUTH ONCE DAILY AT  6  PM 08/01/21   Satira Sark, MD  benazepril (LOTENSIN) 40 MG tablet TAKE 1 TABLET BY MOUTH ONCE DAILY** NEEDS  OFFICE  VISIT 06/27/21   Satira Sark, MD  capecitabine (XELODA) 500 MG tablet Take 2 tablets (1,000 mg total) by mouth 2 (two) times daily after a meal. Take for 14 days, then hold 7 days. Repeat  every 21 days. 10/20/21   Derek Jack, MD  diltiazem (CARDIZEM CD) 180 MG 24 hr capsule Take 1 capsule (180 mg total) by mouth daily. 07/31/18   Orson Eva, MD  dorzolamide (TRUSOPT) 2 % ophthalmic solution Place 1 drop into both eyes 2 (two) times daily. 03/30/21   [provider]  febuxostat (ULORIC) 40 MG tablet Take 1 tablet (40 mg total) by mouth daily. 06/29/21   Derek Jack, MD  FERREX 150 150 MG capsule Take 150 mg by mouth 2 (two) times daily. 01/23/20   [provider]  hydrALAZINE (APRESOLINE) 100 MG tablet Take 100 mg by mouth 2 (two) times daily.    [provider]  hydrochlorothiazide (HYDRODIURIL) 12.5 MG tablet Take 12.5 mg by mouth daily. 01/23/20   [provider]  latanoprost (XALATAN) 0.005 % ophthalmic solution Place 1 drop into both eyes at bedtime. 01/22/20   [provider]  metFORMIN (GLUCOPHAGE-XR) 500 MG 24 hr tablet Take 500 mg by mouth 2 (two) times daily. 12/06/20   [provider]  metoprolol succinate (TOPROL-XL) 50 MG 24 hr tablet Take 50 mg by mouth daily. Take with or immediately following a meal.    [provider]  Multiple Vitamin (MULTIVITAMIN) tablet Take 1 tablet by mouth in the morning.    [provider]  [facility] ULTRA test strip USE 1 STRIP TO [place] DAILY 06/06/21   [provider]  prochlorperazine (COMPAZINE) 10 MG tablet Take 1 tablet (10 mg total) by mouth every 6 (six) hours as needed for nausea or vomiting. 06/23/21   Derek Jack, MD  SSD 1 % cream Apply topically daily. 09/19/21   [provider]  urea (CARMOL) 10 % cream Apply topically 2 (two) times daily. Apply topically to hands twice a day 08/25/21   Derek Jack, MD  vitamin C (ASCORBIC ACID) 500 MG tablet Take 500 mg by mouth 2 (two) times daily.     [provider]    Physical Exam: BP (!) 115/51 (BP Location: Left Arm)   Pulse 61   Temp 98.2 F (36.8 C)  (Oral)   Resp 18   Ht 5' 11.5" (1.816 m)   Wt 96.4 kg   SpO2 95%   BMI 29.23 kg/m   General: 81 y.o. year-old male ill appearing, but was in no acute distress.  Alert and oriented x3. HEENT: NCAT, EOMI Neck: Supple, trachea medial Cardiovascular: Regular rate and rhythm with no rubs or gallops.  No thyromegaly or JVD noted.  No lower extremity edema. 2/4 pulses in all 4 extremities. Respiratory: Clear to auscultation with no wheezes or rales. Good inspiratory effort. Abdomen: Soft, nontender nondistended with normal bowel sounds x4 quadrants. Muskuloskeletal: No cyanosis, clubbing  or edema noted bilaterally Neuro: CN II-XII intact, strength 5/5 x 4, sensation, reflexes intact Skin: No ulcerative lesions noted or rashes Psychiatry: Mood is appropriate for condition and setting          Labs on Admission:  Basic Metabolic Panel: Recent Labs  Lab 11/19/21 2045  NA 134*  K 3.5  CL 100  CO2 24  GLUCOSE 230*  BUN 23  CREATININE 1.04  CALCIUM 9.3   Liver Function Tests: Recent Labs  Lab 11/19/21 2045  AST 26  ALT 22  ALKPHOS 76  BILITOT 0.8  PROT 7.5  ALBUMIN 4.3   No results for input(s): "LIPASE", "AMYLASE" in the last 168 hours. No results for input(s): "AMMONIA" in the last 168 hours. CBC: Recent Labs  Lab 11/19/21 2045  WBC 10.9*  NEUTROABS 9.2*  HGB 13.4  HCT 39.9  MCV 100.5*  PLT 144*   Cardiac Enzymes: No results for input(s): "CKTOTAL", "CKMB", "CKMBINDEX", "TROPONINI" in the last 168 hours.  BNP (last 3 results) No results for input(s): "BNP" in the last 8760 hours.  ProBNP (last 3 results) No results for input(s): "PROBNP" in the last 8760 hours.  CBG: Recent Labs  Lab 11/20/21 0252  GLUCAP 159*    Radiological Exams on Admission: CT Head Wo Contrast  Result Date: 11/19/2021 CLINICAL DATA:  Altered mental status, fever, possible sepsis EXAM: CT HEAD WITHOUT CONTRAST TECHNIQUE: Contiguous axial images were obtained from the base of the  skull through the vertex without intravenous contrast. RADIATION DOSE REDUCTION: This exam was performed according to the departmental dose-optimization program which includes automated exposure control, adjustment of the mA and/or kV according to patient size and/or use of iterative reconstruction technique. COMPARISON:  None Available. FINDINGS: Brain: No evidence of acute infarction, hemorrhage, hydrocephalus, extra-axial collection or mass lesion/mass effect. Vascular: Intracranial atherosclerosis. Skull: Normal. Negative for fracture or focal lesion. Sinuses/Orbits: The visualized paranasal sinuses are essentially clear. The mastoid air cells are unopacified. Other: None. IMPRESSION: Normal head CT. Electronically Signed   By: Julian Hy M.D.   On: 11/19/2021 23:16   DG Chest 1 View  Result Date: 11/19/2021 CLINICAL DATA:  Fever. EXAM: CHEST  1 VIEW COMPARISON:  Chest x-ray 05/20/2021 FINDINGS: The heart size and mediastinal contours are within normal limits. Both lungs are clear. The visualized skeletal structures are unremarkable. IMPRESSION: No active disease. Electronically Signed   By: Ronney Asters M.D.   On: 11/19/2021 21:11    EKG: I independently viewed the EKG done and my findings are as followed: Sinus or ectopic atrial rhythm at a rate of 85 bpm with QTc 534 ms  Assessment/Plan Present on Admission:  Acute febrile illness  PAF (paroxysmal atrial fibrillation) (HCC)  Principal Problem:   Acute febrile illness Active Problems:   PAF (paroxysmal atrial fibrillation) (HCC)   Mixed hyperlipidemia   Essential hypertension   Uncontrolled type 2 diabetes mellitus with hyperglycemia, without long-term current use of insulin (HCC)   Altered mental status, unspecified   Thrombocytopenia (HCC)   Elevated troponin   Macrocytic anemia   Hyponatremia  Acute febrile illness SIRS Patient presents with fever of 103.8 and was tachypneic (met SIRS criteria), however, source of  infection currently unknown Patient was empirically started on IV vancomycin and Zosyn, we shall continue same at this time Procalcitonin will be checked Continue Tylenol as needed Blood culture and urine culture are pending   Altered mental status in setting of above-improved Continue fall precaution and neurochecks Continue treatment as  described above  Prolonged QT interval QTc 54m Avoid QT prolonging drugs Magnesium level will be checked Continue telemetry  Macrocytic anemia MCV 100.5; folate and B12 levels will be checked  Thrombocytopenia possibly reactive Platelets 144, continue to monitor platelet levels,  Elevated troponin possibly secondary to type II demand ischemia Troponin x 2 -29 > 52, patient denies any chest pain Continue to trend troponin  Hyponatremia Na 134, continue IV hydration  Type 2 diabetes mellitus with uncontrolled hyperglycemia  CBG 230, continue ISS and hypoglycemia protocol Last A1c in the 6 months ago was 6.5 Metformin will be held at this time  Essential hypertension Continue Cardizem and Toprol XL  Mixed hyperlipidemia Continue statin  Paroxysmal atrial fibrillation Continue Eliquis, Cardizem and Toprol XL  DVT prophylaxis: Eliquis  Code Status: Full code  Consults: None  Family Communication: Wife and daughter at bedside  Severity of Illness: The appropriate patient status for this patient is INPATIENT. Inpatient status is judged to be reasonable and necessary in order to provide the required intensity of service to ensure the patient's safety. The patient's presenting symptoms, physical exam findings, and initial radiographic and laboratory data in the context of their chronic comorbidities is felt to place them at high risk for further clinical deterioration. Furthermore, it is not anticipated that the patient will be medically stable for discharge from the hospital within 2 midnights of admission.   * I certify that at the  point of admission it is my clinical judgment that the patient will require inpatient hospital care spanning beyond 2 midnights from the point of admission due to high intensity of service, high risk for further deterioration and high frequency of surveillance required.*  Author: OBernadette Hoit DO 11/20/2021 4:51 AM  For on call review [email or web address]

## 2021-11-19 NOTE — ED Notes (Signed)
Patient transported to CT 

## 2021-11-19 NOTE — ED Provider Notes (Signed)
[facility] EMERGENCY DEPARTMENT Provider Note   CSN: [id number] Arrival date & time: 11/19/21  2020     History  CC: Infection   [name] [name] is a 81 y.o. male presenting from home with concern for confusion.  His wife reports the patient was behaving well until 2 days ago, had some fevers yesterday and then this evening began wandering at the house appearing confused, appeared to be getting lost.  She reports he had some cough which was intermittent now resolved.  The patient himself appears pleasantly confused, denies headache, denies sore throat, says he feels "well".  His wife says he is behaving strangely and he is running a fever.  She reports she does have a history of colon cancer, but stopped all treatments about 2 months ago due to potential side effects.  He is not currently on any chemotherapy.  His wife feels that his urine was "strong smelling".  HPI     Home Medications Prior to Admission medications   Medication Sig Start Date End Date Taking? Authorizing Provider  apixaban (ELIQUIS) 5 MG TABS tablet Take 1 tablet (5 mg total) by mouth 2 (two) times daily. 04/13/21   Mansouraty, Telford Nab., MD  atorvastatin (LIPITOR) 80 MG tablet TAKE 1 TABLET BY MOUTH ONCE DAILY AT  6  PM 08/01/21   Satira Sark, MD  benazepril (LOTENSIN) 40 MG tablet TAKE 1 TABLET BY MOUTH ONCE DAILY** NEEDS  OFFICE  VISIT 06/27/21   Satira Sark, MD  capecitabine (XELODA) 500 MG tablet Take 2 tablets (1,000 mg total) by mouth 2 (two) times daily after a meal. Take for 14 days, then hold 7 days. Repeat every 21 days. 10/20/21   Derek Jack, MD  diltiazem (CARDIZEM CD) 180 MG 24 hr capsule Take 1 capsule (180 mg total) by mouth daily. 07/31/18   Orson Eva, MD  dorzolamide (TRUSOPT) 2 % ophthalmic solution Place 1 drop into both eyes 2 (two) times daily. 03/30/21   [provider]  febuxostat (ULORIC) 40 MG tablet Take 1 tablet (40 mg total) by mouth daily. 06/29/21   Derek Jack, MD  FERREX 150 150 MG capsule Take 150 mg by mouth 2 (two) times daily. 01/23/20   [provider]  hydrALAZINE (APRESOLINE) 100 MG tablet Take 100 mg by mouth 2 (two) times daily.    [provider]  hydrochlorothiazide (HYDRODIURIL) 12.5 MG tablet Take 12.5 mg by mouth daily. 01/23/20   [provider]  latanoprost (XALATAN) 0.005 % ophthalmic solution Place 1 drop into both eyes at bedtime. 01/22/20   [provider]  metFORMIN (GLUCOPHAGE-XR) 500 MG 24 hr tablet Take 500 mg by mouth 2 (two) times daily. 12/06/20   [provider]  metoprolol succinate (TOPROL-XL) 50 MG 24 hr tablet Take 50 mg by mouth daily. Take with or immediately following a meal.    [provider]  Multiple Vitamin (MULTIVITAMIN) tablet Take 1 tablet by mouth in the morning.    [provider]  [facility] ULTRA test strip USE 1 STRIP TO [place] DAILY 06/06/21   [provider]  prochlorperazine (COMPAZINE) 10 MG tablet Take 1 tablet (10 mg total) by mouth every 6 (six) hours as needed for nausea or vomiting. 06/23/21   Derek Jack, MD  SSD 1 % cream Apply topically daily. 09/19/21   [provider]  urea (CARMOL) 10 % cream Apply topically 2 (two) times daily. Apply topically to hands twice a day 08/25/21  Derek Jack, MD  vitamin C (ASCORBIC ACID) 500 MG tablet Take 500 mg by mouth 2 (two) times daily.     [provider]      Allergies    Amiodarone    Review of Systems   Review of Systems  Physical Exam Updated Vital Signs BP (!) 159/56   Pulse 82   Temp (!) 103.8 F (39.9 C) (Oral)   Resp 18   Ht 5' 11.5" (1.816 m)   Wt 95.6 kg   SpO2 93%   BMI 28.99 kg/m  Physical Exam Constitutional:      General: He is not in acute distress. HENT:     Head: Normocephalic and atraumatic.  Eyes:     Conjunctiva/sclera: Conjunctivae normal.     Pupils: Pupils are equal, round, and reactive to  light.  Cardiovascular:     Rate and Rhythm: Normal rate and regular rhythm.  Pulmonary:     Effort: Pulmonary effort is normal. No respiratory distress.  Abdominal:     General: There is no distension.     Tenderness: There is no abdominal tenderness.  Skin:    General: Skin is warm and dry.  Neurological:     General: No focal deficit present.     Mental Status: He is alert and oriented to person, place, and time. Mental status is at baseline.  Psychiatric:        Mood and Affect: Mood normal.        Behavior: Behavior normal.     ED Results / Procedures / Treatments   Labs (all labs ordered are listed, but only abnormal results are displayed) Labs Reviewed  COMPREHENSIVE METABOLIC PANEL - Abnormal; Notable for the following components:      Result Value   Sodium 134 (*)    Glucose, Bld 230 (*)    All other components within normal limits  CBC WITH DIFFERENTIAL/PLATELET - Abnormal; Notable for the following components:   WBC 10.9 (*)    RBC 3.97 (*)    MCV 100.5 (*)    Platelets 144 (*)    Neutro Abs 9.2 (*)    Lymphs Abs 0.5 (*)    Abs Immature Granulocytes 0.09 (*)    All other components within normal limits  URINALYSIS, ROUTINE W REFLEX MICROSCOPIC - Abnormal; Notable for the following components:   Glucose, UA >=500 (*)    Protein, ur 100 (*)    Bacteria, UA RARE (*)    All other components within normal limits  TROPONIN I (HIGH SENSITIVITY) - Abnormal; Notable for the following components:   Troponin I (High Sensitivity) 29 (*)    All other components within normal limits  CULTURE, BLOOD (ROUTINE X 2)  CULTURE, BLOOD (ROUTINE X 2)  SARS CORONAVIRUS 2 BY RT PCR  LACTIC ACID, PLASMA  PROTIME-INR  TROPONIN I (HIGH SENSITIVITY)    EKG EKG Interpretation  Date/Time:  Saturday November 19 2021 20:36:10 EDT Ventricular Rate:  85 PR Interval:  199 QRS Duration: 151 QT Interval:  449 QTC Calculation: 534 R Axis:   -67 Text Interpretation: Sinus or ectopic  atrial rhythm Nonspecific IVCD with LAD Anterolateral infarct, age indeterminate Repol abnrm, severe global ischemia (LM/MVD) Baseline wander in lead(s) I II aVR aVL Confirmed by Merrily Pew [phone]) on 11/19/2021 11:29:14 PM  Radiology CT Head Wo Contrast  Result Date: 11/19/2021 CLINICAL DATA:  Altered mental status, fever, possible sepsis EXAM: CT HEAD WITHOUT CONTRAST TECHNIQUE: Contiguous axial images were obtained from the base  of the skull through the vertex without intravenous contrast. RADIATION DOSE REDUCTION: This exam was performed according to the departmental dose-optimization program which includes automated exposure control, adjustment of the mA and/or kV according to patient size and/or use of iterative reconstruction technique. COMPARISON:  None Available. FINDINGS: Brain: No evidence of acute infarction, hemorrhage, hydrocephalus, extra-axial collection or mass lesion/mass effect. Vascular: Intracranial atherosclerosis. Skull: Normal. Negative for fracture or focal lesion. Sinuses/Orbits: The visualized paranasal sinuses are essentially clear. The mastoid air cells are unopacified. Other: None. IMPRESSION: Normal head CT. Electronically Signed   By: Julian Hy M.D.   On: 11/19/2021 23:16   DG Chest 1 View  Result Date: 11/19/2021 CLINICAL DATA:  Fever. EXAM: CHEST  1 VIEW COMPARISON:  Chest x-ray 05/20/2021 FINDINGS: The heart size and mediastinal contours are within normal limits. Both lungs are clear. The visualized skeletal structures are unremarkable. IMPRESSION: No active disease. Electronically Signed   By: Ronney Asters M.D.   On: 11/19/2021 21:11    Procedures Procedures    Medications Ordered in ED Medications  vancomycin (VANCOREADY) IVPB 1500 mg/300 mL (1,500 mg Intravenous New Bag/Given 11/19/21 2257)  piperacillin-tazobactam (ZOSYN) IVPB 3.375 g (0 g Intravenous Stopped 11/19/21 2341)  sodium chloride 0.9 % bolus 1,000 mL (1,000 mLs Intravenous New Bag/Given  11/19/21 2341)    ED Course/ Medical Decision Making/ A&P Clinical Course as of 11/19/21 2343  Sat Nov 19, 2021  2330 Blood pressure is mildly hypertensive, lactate normal.  No evidence of severe sepsis. [MT]  2331 Plan for medical admission [MT]    Clinical Course User Index [MT] [place] Haub, Carola Rhine, MD                           Medical Decision Making Amount and/or Complexity of Data Reviewed Labs: ordered. Radiology: ordered.  Risk Prescription drug management. Decision regarding hospitalization.   This patient presents to the ED with concern for fever, confusion. This involves an extensive number of treatment options, and is a complaint that carries with it a high risk of complications and morbidity.  The differential diagnosis includes UTI versus abscess versus viral illness versus metabolic encephalopathy versus other  He has no headache, no nuchal rigidity, no photophobia, have a lower suspicion for meningitis.  He does not appear encephalopathic to me.  He appears pleasant and calm, per his wife's history, there is some concern for acute behavioral change.  Additional history obtained from patient's wife at bedside  I ordered and personally interpreted labs.  The pertinent results include: Mild leukocytosis, COVID-negative, troponin is at baseline.  Lactate unremarkable.  I ordered imaging studies including CT of the head, x-ray of the chest I independently visualized and interpreted imaging which showed no acute abnormalities I agree with the radiologist interpretation  The patient was maintained on a cardiac monitor.  I personally viewed and interpreted the cardiac monitored which showed an underlying rhythm of: Sinus rhythm  Per my interpretation the patient's ECG shows sinus rhythm  I ordered medication including IV vancomycin as well as broad-spectrum antibiotics, IV fluid bolus I have reviewed the patients home medicines and have made adjustments as needed  Test  Considered: Patient is no headache, nuchal rigidity, photophobia to suggest acute meningitis.  Do not feel he needed emergent LP at this time.   After the interventions noted above, I reevaluated the patient and found that they have: stayed the same   Dispostion:  After consideration of the  diagnostic results and the patients response to treatment, I feel that the patent would benefit from medical admission.         Final Clinical Impression(s) / ED Diagnoses Final diagnoses:  Altered mental status, unspecified altered mental status type    Rx / DC Orders ED Discharge Orders     None         Taino Maertens, Carola Rhine, MD 11/19/21 [phone]

## 2021-11-19 NOTE — ED Notes (Signed)
Pt returned from Radiology.

## 2021-11-19 NOTE — ED Provider Notes (Signed)
11:29 PM Assumed care from Dr. Langston Masker, please see their note for full history, physical and decision making until this point. In brief this is a 81 y.o. year old male who presented to the ED tonight with Code Sepsis     Here with fever of unclear etiology. Likely admit.   Discussed with Dr. Josephine Cables for admission.  Labs, studies and imaging reviewed by myself and considered in medical decision making if ordered. Imaging interpreted by radiology.  Labs Reviewed  COMPREHENSIVE METABOLIC PANEL - Abnormal; Notable for the following components:      Result Value   Sodium 134 (*)    Glucose, Bld 230 (*)    All other components within normal limits  CBC WITH DIFFERENTIAL/PLATELET - Abnormal; Notable for the following components:   WBC 10.9 (*)    RBC 3.97 (*)    MCV 100.5 (*)    Platelets 144 (*)    Neutro Abs 9.2 (*)    Lymphs Abs 0.5 (*)    Abs Immature Granulocytes 0.09 (*)    All other components within normal limits  URINALYSIS, ROUTINE W REFLEX MICROSCOPIC - Abnormal; Notable for the following components:   Glucose, UA >=500 (*)    Protein, ur 100 (*)    Bacteria, UA RARE (*)    All other components within normal limits  TROPONIN I (HIGH SENSITIVITY) - Abnormal; Notable for the following components:   Troponin I (High Sensitivity) 29 (*)    All other components within normal limits  CULTURE, BLOOD (ROUTINE X 2)  CULTURE, BLOOD (ROUTINE X 2)  SARS CORONAVIRUS 2 BY RT PCR  LACTIC ACID, PLASMA  PROTIME-INR  TROPONIN I (HIGH SENSITIVITY)    CT Head Wo Contrast  Final Result    DG Chest 1 View  Final Result      No follow-ups on file.    Lenee Franze, Corene Cornea, MD 11/20/21 [phone]

## 2021-11-19 NOTE — ED Triage Notes (Signed)
Pt arrived via POV c/o 103.9 F Fever at home, disorientation, new onset incontinence since yesterday. Pt has 103F Temp in Triage and reports having Tylenol at 1950 PTA today. Pt has right leg swelling and warmth to the touch. Pt receiving Chemo for prostate cancer.

## 2021-11-20 DIAGNOSIS — R4182 Altered mental status, unspecified: Secondary | ICD-10-CM | POA: Diagnosis not present

## 2021-11-20 DIAGNOSIS — R7989 Other specified abnormal findings of blood chemistry: Secondary | ICD-10-CM

## 2021-11-20 DIAGNOSIS — R778 Other specified abnormalities of plasma proteins: Secondary | ICD-10-CM

## 2021-11-20 DIAGNOSIS — D539 Nutritional anemia, unspecified: Secondary | ICD-10-CM

## 2021-11-20 DIAGNOSIS — R509 Fever, unspecified: Secondary | ICD-10-CM | POA: Diagnosis not present

## 2021-11-20 DIAGNOSIS — D696 Thrombocytopenia, unspecified: Secondary | ICD-10-CM

## 2021-11-20 DIAGNOSIS — I10 Essential (primary) hypertension: Secondary | ICD-10-CM | POA: Diagnosis not present

## 2021-11-20 DIAGNOSIS — E871 Hypo-osmolality and hyponatremia: Secondary | ICD-10-CM

## 2021-11-20 LAB — RESPIRATORY PANEL BY PCR

## 2021-11-20 LAB — GLUCOSE, CAPILLARY
Glucose-Capillary: 159 mg/dL — ABNORMAL HIGH (ref 70–99)
Glucose-Capillary: 129 mg/dL — ABNORMAL HIGH (ref 70–99)
Glucose-Capillary: 196 mg/dL — ABNORMAL HIGH (ref 70–99)
Glucose-Capillary: 127 mg/dL — ABNORMAL HIGH (ref 70–99)
Glucose-Capillary: 162 mg/dL — ABNORMAL HIGH (ref 70–99)

## 2021-11-20 LAB — TROPONIN I (HIGH SENSITIVITY)
Troponin I (High Sensitivity): 60 ng/L — ABNORMAL HIGH (ref <18)
Troponin I (High Sensitivity): 48 ng/L — ABNORMAL HIGH (ref <18)

## 2021-11-20 LAB — COMPREHENSIVE METABOLIC PANEL
ALT: 18 U/L (ref 0–44)
AST: 21 U/L (ref 15–41)
Albumin: 3.3 g/dL — ABNORMAL LOW (ref 3.5–5.0)
Alkaline Phosphatase: 55 U/L (ref 38–126)
Anion gap: 7 (ref 5–15)
BUN: 20 mg/dL (ref 8–23)
CO2: 23 mmol/L (ref 22–32)
Calcium: 8.6 mg/dL — ABNORMAL LOW (ref 8.9–10.3)
Chloride: 106 mmol/L (ref 98–111)
Creatinine, Ser: 0.86 mg/dL (ref 0.61–1.24)
GFR, Estimated: >60 mL/min (ref >60)
Glucose, Bld: 155 mg/dL — ABNORMAL HIGH (ref 70–99)
Potassium: 2.8 mmol/L — ABNORMAL LOW (ref 3.5–5.1)
Sodium: 136 mmol/L (ref 135–145)
Total Bilirubin: 0.6 mg/dL (ref 0.3–1.2)
Total Protein: 5.9 g/dL — ABNORMAL LOW (ref 6.5–8.1)

## 2021-11-20 LAB — HEMOGLOBIN A1C
Hgb A1c MFr Bld: 6 % — ABNORMAL HIGH (ref 4.8–5.6)
Mean Plasma Glucose: 125.5 mg/dL

## 2021-11-20 LAB — FOLATE: Folate: 23.3 ng/mL (ref >5.9)

## 2021-11-20 LAB — CBC
HCT: 33.4 % — ABNORMAL LOW (ref 39.0–52.0)
Hemoglobin: 11.4 g/dL — ABNORMAL LOW (ref 13.0–17.0)
MCH: 34.2 pg — ABNORMAL HIGH (ref 26.0–34.0)
MCHC: 34.1 g/dL (ref 30.0–36.0)
MCV: 100.3 fL — ABNORMAL HIGH (ref 80.0–100.0)
Platelets: 109 10*3/uL — ABNORMAL LOW (ref 150–400)
RBC: 3.33 MIL/uL — ABNORMAL LOW (ref 4.22–5.81)
RDW: 14.3 % (ref 11.5–15.5)
WBC: 10.2 10*3/uL (ref 4.0–10.5)
nRBC: 0 % (ref 0.0–0.2)

## 2021-11-20 LAB — PHOSPHORUS: Phosphorus: 3.7 mg/dL (ref 2.5–4.6)

## 2021-11-20 LAB — MAGNESIUM: Magnesium: 1.8 mg/dL (ref 1.7–2.4)

## 2021-11-20 LAB — VITAMIN B12: Vitamin B-12: 183 pg/mL (ref 180–914)

## 2021-11-20 MED ORDER — ONDANSETRON HCL 4 MG PO TABS: 4.0000 mg | ORAL_TABLET | Freq: Four times a day (QID) | ORAL | Status: DC | PRN

## 2021-11-20 MED ORDER — ACETAMINOPHEN 650 MG RE SUPP: 650.0000 mg | Freq: Four times a day (QID) | RECTAL | Status: DC | PRN

## 2021-11-20 MED ORDER — HEPARIN SODIUM (PORCINE) 5000 UNIT/ML IJ SOLN
5000.0000 [IU] | Freq: Three times a day (TID) | INTRAMUSCULAR | Status: DC
  Administered 2021-11-20 – 2021-11-21 (×3): 5000 [IU] via SUBCUTANEOUS
  Filled 2021-11-20 (×3): qty 1

## 2021-11-20 MED ORDER — PIPERACILLIN-TAZOBACTAM 3.375 G IVPB
3.3750 g | Freq: Three times a day (TID) | INTRAVENOUS | Status: DC
  Administered 2021-11-20 – 2021-11-21 (×4): 3.375 g via INTRAVENOUS
  Filled 2021-11-20 (×4): qty 50

## 2021-11-20 MED ORDER — ACETAMINOPHEN 325 MG PO TABS: 650.0000 mg | ORAL_TABLET | Freq: Four times a day (QID) | ORAL | Status: DC | PRN

## 2021-11-20 MED ORDER — VITAMIN B-12 1000 MCG PO TABS
1000.0000 ug | ORAL_TABLET | Freq: Every day | ORAL | Status: DC
Start: 2021-11-20 — End: 2021-11-21
  Administered 2021-11-20 – 2021-11-21 (×2): 1000 ug via ORAL
  Filled 2021-11-20 (×2): qty 1

## 2021-11-20 MED ORDER — ONDANSETRON HCL 4 MG/2ML IJ SOLN: 4.0000 mg | Freq: Four times a day (QID) | INTRAMUSCULAR | Status: DC | PRN

## 2021-11-20 MED ORDER — INSULIN ASPART 100 UNIT/ML IJ SOLN
0.0000 [IU] | Freq: Three times a day (TID) | INTRAMUSCULAR | Status: DC
  Administered 2021-11-20: 2 [IU] via SUBCUTANEOUS
  Administered 2021-11-20: 3 [IU] via SUBCUTANEOUS
  Administered 2021-11-20 – 2021-11-21 (×2): 2 [IU] via SUBCUTANEOUS
  Administered 2021-11-21: 5 [IU] via SUBCUTANEOUS

## 2021-11-20 MED ORDER — POTASSIUM CHLORIDE CRYS ER 20 MEQ PO TBCR
40.0000 meq | EXTENDED_RELEASE_TABLET | Freq: Two times a day (BID) | ORAL | Status: DC
  Administered 2021-11-20 – 2021-11-21 (×3): 40 meq via ORAL
  Filled 2021-11-20 (×3): qty 2

## 2021-11-20 MED ORDER — VANCOMYCIN HCL 1500 MG/300ML IV SOLN
1500.0000 mg | INTRAVENOUS | Status: DC
  Administered 2021-11-20: 1500 mg via INTRAVENOUS
  Filled 2021-11-20: qty 300

## 2021-11-20 MED ORDER — SODIUM CHLORIDE 0.9 % IV SOLN
INTRAVENOUS | Status: AC
Start: 2021-11-20 — End: 2021-11-20

## 2021-11-20 NOTE — Progress Notes (Signed)
Pharmacy Antibiotic Note  [name] [name] is a 81 y.o. male admitted on 11/19/2021 with  sepsis/fever .  Pharmacy has been consulted for Vancomycin/Zosyn dosing. WBC 10.9. Renal function age appropriate.   Plan: Vancomycin 1500 mg IV q24h >>>Estimated AUC: 405 Zosyn 3.375G IV q8h to be infused over 4 hours Trend WBC, temp, renal function  F/U infectious work-up Drug levels as indicated   Height: 5' 11.5" (181.6 cm) Weight: 96.4 kg (212 lb 8.4 oz) IBW/kg (Calculated) : 76.45  Temp (24hrs), Avg:101 F (38.3 C), Min:98.2 F (36.8 C), Max:103.8 F (39.9 C)  Recent Labs  Lab 11/19/21 2045  WBC 10.9*  CREATININE 1.04  LATICACIDVEN 1.4    Estimated Creatinine Clearance: 66.6 mL/min (by C-G formula based on SCr of 1.04 mg/dL).    Allergies  Allergen Reactions   Amiodarone Rash    Narda Bonds, PharmD, BCPS Clinical Pharmacist Phone: [phone]

## 2021-11-20 NOTE — Progress Notes (Signed)
PROGRESS NOTE    [name] [name]  VFI:[id number] DOB: 10-Jul-1940 DOA: 11/19/2021 PCP: Caryl Bis, MD    Chief Complaint  Patient presents with   Code Sepsis    Brief Narrative:  [name] [name] is a 81 y.o. male with medical history significant of stage III (T1N1B) transverse colon adenocarcinoma, CAD, paroxysmal atrial fibrillation on Eliquis, hypertension, hyperlipidemia who presents to the emergency department via EMS due to altered mental status . patient was febrile with a temperature of 103.8, respiratory rate was 22/min, pulse 82 bpm, BP 163/61, O2 sat 99%. SARS coronavirus 2 was negative.  Blood culture pending CT head without contrast  no acute stroke.  Chest x-ray showed no acute disease.  He was started empirically on broad spectrum IV antibiotics and follow blood cultures.  Assessment & Plan:   Principal Problem:   Acute febrile illness Active Problems:   PAF (paroxysmal atrial fibrillation) (HCC)   Mixed hyperlipidemia   Essential hypertension   Uncontrolled type 2 diabetes mellitus with hyperglycemia, without long-term current use of insulin (HCC)   Altered mental status, unspecified   Thrombocytopenia (HCC)   Elevated troponin   Macrocytic anemia   Hyponatremia   Acute febrile illness/SIRS Patient came in with a fever of 103.8 and tachypnea.  So far work-up has been negative for infection respiratory panel is still pending. COVID 19 is negative, chest x-ray and urine analysis are negative. Follow blood cultures. Patient was started empirically on broad-spectrum IV antibiotics continue to follow. Therapy evaluations have been ordered.    Altered mental status/acute metabolic encephalopathy probably secondary to acute febrile illness. Appears to have resolved Patient alert and answering all questions appropriately.   Microcytic anemia B12 levels are low normal, supplementation added.   Mild thrombocytopenia Continue to monitor.    Elevated  troponin probably secondary to type II demand ischemia Mildly elevated troponins. Patient currently denies any chest pain. EKG showed normal sinus rhythm.    Essential hypertension Blood pressure parameters appear to be optimal.     Type 2 diabetes mellitus Non-insulin-dependent Controlled CBGs Hemoglobin A1c around 6 Continue with sliding scale   Paroxysmal atrial fibrillation Rate controlled.     Hypokalemia Magnesium levels at 1.8 Replaced Repeat levels in the morning.         DVT prophylaxis: (Heparin Code Status: Full code Family Communication: Family at bedside Disposition:   Status is: Inpatient Remains inpatient appropriate because: IV antibiotics   Level of care: Telemetry Consultants:  None.   Procedures: None Antimicrobials: Empirically on vancomycin and cefepime  Subjective: No new complaints no chest pain or shortness of breath  Objective: Vitals:   11/20/21 0235 11/20/21 0613 11/20/21 0645 11/20/21 1247  BP: (!) 115/51 (!) 129/49 (!) 127/49 (!) 155/61  Pulse: 61 (!) 52 (!) 52 (!) 55  Resp: '18 18 18 18  '$ Temp: 98.2 F (36.8 C) 97.7 F (36.5 C) 97.7 F (36.5 C) 98 F (36.7 C)  TempSrc: Oral Oral Oral Oral  SpO2: 95% 98% 95% 99%  Weight:      Height:        Intake/Output Summary (Last 24 hours) at 11/20/2021 1404 Last data filed at 11/20/2021 1248 Gross per 24 hour  Intake 2310 ml  Output 600 ml  Net 1710 ml   Filed Weights   11/19/21 2040 11/20/21 0234  Weight: 95.6 kg 96.4 kg    Examination:  General exam: Appears calm and comfortable  Respiratory system: Clear to auscultation. Respiratory effort normal. Cardiovascular system:  S1 & S2 heard, RRR. No JVD,  No pedal edema. Gastrointestinal system: Abdomen is nondistended, soft and nontender.  Normal bowel sounds heard. Central nervous system: Alert and oriented. No focal neurological deficits.  Very hard of hearing Extremities: Symmetric 5 x 5 power. Skin: No rashes,  lesions or ulcers Psychiatry: Mood & affect appropriate.     Data Reviewed: I have personally reviewed following labs and imaging studies  CBC: Recent Labs  Lab 11/19/21 2045 11/20/21 0423  WBC 10.9* 10.2  NEUTROABS 9.2*  --   HGB 13.4 11.4*  HCT 39.9 33.4*  MCV 100.5* 100.3*  PLT 144* 109*    Basic Metabolic Panel: Recent Labs  Lab 11/19/21 2045 11/20/21 0423  NA 134* 136  K 3.5 2.8*  CL 100 106  CO2 24 23  GLUCOSE 230* 155*  BUN 23 20  CREATININE 1.04 0.86  CALCIUM 9.3 8.6*  MG  --  1.8  PHOS  --  3.7    GFR: Estimated Creatinine Clearance: 80.5 mL/min (by C-G formula based on SCr of 0.86 mg/dL).  Liver Function Tests: Recent Labs  Lab 11/19/21 2045 11/20/21 0423  AST 26 21  ALT 22 18  ALKPHOS 76 55  BILITOT 0.8 0.6  PROT 7.5 5.9*  ALBUMIN 4.3 3.3*    CBG: Recent Labs  Lab 11/20/21 0252 11/20/21 0745 11/20/21 1116  GLUCAP 159* 129* 196*     Recent Results (from the past 240 hour(s))  Culture, blood (Routine x 2)     Status: None (Preliminary result)   Collection Time: 11/19/21  8:45 PM   Specimen: Right Antecubital; Blood  Result Value Ref Range Status   Specimen Description RIGHT ANTECUBITAL  Final   Special Requests   Final    BOTTLES DRAWN AEROBIC AND ANAEROBIC Blood Culture adequate volume Performed at [facility], [address], [place], [place] [address]    Culture PENDING  Incomplete   Report Status PENDING  Incomplete  Culture, blood (Routine x 2)     Status: None (Preliminary result)   Collection Time: 11/19/21  8:45 PM   Specimen: Left Antecubital; Blood  Result Value Ref Range Status   Specimen Description LEFT ANTECUBITAL  Final   Special Requests   Final    BOTTLES DRAWN AEROBIC AND ANAEROBIC Blood Culture results may not be optimal due to an inadequate volume of blood received in culture bottles Performed at [facility], [address], [place], [place] [address]    Culture PENDING  Incomplete   Report Status  PENDING  Incomplete  SARS Coronavirus 2 by RT PCR (hospital order, performed in [place] hospital lab) *cepheid single result test* Anterior Nasal Swab     Status: None   Collection Time: 11/19/21  8:50 PM   Specimen: Anterior Nasal Swab  Result Value Ref Range Status   SARS Coronavirus 2 by RT PCR NEGATIVE NEGATIVE Final    Comment: (NOTE) SARS-CoV-2 target nucleic acids are NOT DETECTED.  The SARS-CoV-2 RNA is generally detectable in upper and lower respiratory specimens during the acute phase of infection. The lowest concentration of SARS-CoV-2 viral copies this assay can detect is 250 copies / mL. A negative result does not preclude SARS-CoV-2 infection and should not be used as the sole basis for treatment or other patient management decisions.  A negative result may occur with improper specimen collection / handling, submission of specimen other than nasopharyngeal swab, presence of viral mutation(s) within the areas targeted by this assay, and inadequate number  of viral copies (<250 copies / mL). A negative result must be combined with clinical observations, patient history, and epidemiological information.  Fact Sheet for Patients:   https://www.patel.info/  Fact Sheet for Healthcare Providers: https://hall.com/  This test is not yet approved or  cleared by the [place] FDA and has been authorized for detection and/or diagnosis of SARS-CoV-2 by FDA under an Emergency Use Authorization (EUA).  This EUA will remain in effect (meaning this test can be used) for the duration of the COVID-19 declaration under Section 564(b)(1) of the Act, 21 U.S.C. section 360bbb-3(b)(1), unless the authorization is terminated or revoked sooner.  Performed at [facility], [address], [place], [place] [address]          Radiology Studies: CT Head Wo Contrast  Result Date: 11/19/2021 CLINICAL DATA:  Altered mental status, fever,  possible sepsis EXAM: CT HEAD WITHOUT CONTRAST TECHNIQUE: Contiguous axial images were obtained from the base of the skull through the vertex without intravenous contrast. RADIATION DOSE REDUCTION: This exam was performed according to the departmental dose-optimization program which includes automated exposure control, adjustment of the mA and/or kV according to patient size and/or use of iterative reconstruction technique. COMPARISON:  None Available. FINDINGS: Brain: No evidence of acute infarction, hemorrhage, hydrocephalus, extra-axial collection or mass lesion/mass effect. Vascular: Intracranial atherosclerosis. Skull: Normal. Negative for fracture or focal lesion. Sinuses/Orbits: The visualized paranasal sinuses are essentially clear. The mastoid air cells are unopacified. Other: None. IMPRESSION: Normal head CT. Electronically Signed   By: Julian Hy M.D.   On: 11/19/2021 23:16   DG Chest 1 View  Result Date: 11/19/2021 CLINICAL DATA:  Fever. EXAM: CHEST  1 VIEW COMPARISON:  Chest x-ray 05/20/2021 FINDINGS: The heart size and mediastinal contours are within normal limits. Both lungs are clear. The visualized skeletal structures are unremarkable. IMPRESSION: No active disease. Electronically Signed   By: Ronney Asters M.D.   On: 11/19/2021 21:11        Scheduled Meds:  vitamin B-12  1,000 mcg Oral Daily   insulin aspart  0-15 Units Subcutaneous TID WC   potassium chloride  40 mEq Oral BID   Continuous Infusions:  piperacillin-tazobactam (ZOSYN)  IV 3.375 g (11/20/21 1250)   vancomycin       LOS: 1 day    Time spent: 40 minutes    Hosie Poisson, MD [facility]   To contact the attending provider between 7A-7P or the covering provider during after hours 7P-7A, please log into the web site [email or web address] and access using universal [place] password for that web site. If you do not have the password, please call the hospital operator.  11/20/2021, 2:04 PM

## 2021-11-21 DIAGNOSIS — R509 Fever, unspecified: Secondary | ICD-10-CM | POA: Diagnosis not present

## 2021-11-21 DIAGNOSIS — R4182 Altered mental status, unspecified: Secondary | ICD-10-CM | POA: Diagnosis not present

## 2021-11-21 DIAGNOSIS — I10 Essential (primary) hypertension: Secondary | ICD-10-CM | POA: Diagnosis not present

## 2021-11-21 DIAGNOSIS — R531 Weakness: Secondary | ICD-10-CM | POA: Diagnosis not present

## 2021-11-21 DIAGNOSIS — R778 Other specified abnormalities of plasma proteins: Secondary | ICD-10-CM | POA: Diagnosis not present

## 2021-11-21 LAB — CBC WITH DIFFERENTIAL/PLATELET
Abs Immature Granulocytes: 0.03 10*3/uL (ref 0.00–0.07)
Basophils Absolute: 0 10*3/uL (ref 0.0–0.1)
Basophils Relative: 0 %
Eosinophils Absolute: 0.1 10*3/uL (ref 0.0–0.5)
Eosinophils Relative: 1 %
HCT: 34.8 % — ABNORMAL LOW (ref 39.0–52.0)
Hemoglobin: 11.8 g/dL — ABNORMAL LOW (ref 13.0–17.0)
Immature Granulocytes: 0 %
Lymphocytes Relative: 15 %
Lymphs Abs: 1.1 10*3/uL (ref 0.7–4.0)
MCH: 34.1 pg — ABNORMAL HIGH (ref 26.0–34.0)
MCHC: 33.9 g/dL (ref 30.0–36.0)
MCV: 100.6 fL — ABNORMAL HIGH (ref 80.0–100.0)
Monocytes Absolute: 1.1 10*3/uL — ABNORMAL HIGH (ref 0.1–1.0)
Monocytes Relative: 15 %
Neutro Abs: 5 10*3/uL (ref 1.7–7.7)
Neutrophils Relative %: 69 %
Platelets: 120 10*3/uL — ABNORMAL LOW (ref 150–400)
RBC: 3.46 MIL/uL — ABNORMAL LOW (ref 4.22–5.81)
RDW: 14.1 % (ref 11.5–15.5)
WBC: 7.3 10*3/uL (ref 4.0–10.5)
nRBC: 0 % (ref 0.0–0.2)

## 2021-11-21 LAB — BASIC METABOLIC PANEL
Anion gap: 6 (ref 5–15)
BUN: 12 mg/dL (ref 8–23)
CO2: 23 mmol/L (ref 22–32)
Calcium: 8.7 mg/dL — ABNORMAL LOW (ref 8.9–10.3)
Chloride: 109 mmol/L (ref 98–111)
Creatinine, Ser: 0.79 mg/dL (ref 0.61–1.24)
GFR, Estimated: >60 mL/min (ref >60)
Glucose, Bld: 141 mg/dL — ABNORMAL HIGH (ref 70–99)
Potassium: 4 mmol/L (ref 3.5–5.1)
Sodium: 138 mmol/L (ref 135–145)

## 2021-11-21 LAB — GLUCOSE, CAPILLARY
Glucose-Capillary: 149 mg/dL — ABNORMAL HIGH (ref 70–99)
Glucose-Capillary: 239 mg/dL — ABNORMAL HIGH (ref 70–99)

## 2021-11-21 LAB — URINE CULTURE: Culture: NO GROWTH

## 2021-11-21 MED ORDER — CYANOCOBALAMIN 1000 MCG PO TABS: 1000.0000 ug | ORAL_TABLET | Freq: Every day | ORAL | Status: AC

## 2021-11-21 MED ORDER — AMOXICILLIN-POT CLAVULANATE 875-125 MG PO TABS: 1.0000 | ORAL_TABLET | Freq: Two times a day (BID) | ORAL | 0 refills | Status: AC

## 2021-11-21 MED ORDER — DOXYCYCLINE HYCLATE 50 MG PO CAPS: 100.0000 mg | ORAL_CAPSULE | Freq: Two times a day (BID) | ORAL | 0 refills | Status: AC

## 2021-11-21 NOTE — Progress Notes (Signed)
Nsg Discharge Note  Admit Date:  11/19/2021 Discharge date: 11/21/2021   MARE LUDTKE to be D/C'd Home per MD order.  AVS completed.   Reviewed d/c paperwork with patient and answered all questions. Patient/caregiver able to verbalize understanding.  Discharge Medication: Allergies as of 11/21/2021       Reactions   Amiodarone Rash        Medication List     STOP taking these medications    capecitabine 500 MG tablet Commonly known as: Xeloda   hydrochlorothiazide 12.5 MG tablet Commonly known as: HYDRODIURIL       TAKE these medications    amoxicillin-clavulanate 875-125 MG tablet Commonly known as: AUGMENTIN Take 1 tablet by mouth 2 (two) times daily for 7 days.   apixaban 5 MG Tabs tablet Commonly known as: ELIQUIS Take 1 tablet (5 mg total) by mouth 2 (two) times daily.   ascorbic acid 500 MG tablet Commonly known as: VITAMIN C Take 500 mg by mouth 2 (two) times daily.   atorvastatin 80 MG tablet Commonly known as: LIPITOR TAKE 1 TABLET BY MOUTH ONCE DAILY AT  6  PM What changed: See the new instructions.   benazepril 40 MG tablet Commonly known as: LOTENSIN TAKE 1 TABLET BY MOUTH ONCE DAILY** NEEDS  OFFICE  VISIT What changed: See the new instructions.   cyanocobalamin 1000 MCG tablet Take 1 tablet (1,000 mcg total) by mouth daily. Start taking on: November 22, 2021   diltiazem 180 MG 24 hr capsule Commonly known as: CARDIZEM CD Take 1 capsule (180 mg total) by mouth daily.   dorzolamide 2 % ophthalmic solution Commonly known as: TRUSOPT Place 1 drop into both eyes 2 (two) times daily.   doxycycline 50 MG capsule Commonly known as: VIBRAMYCIN Take 2 capsules (100 mg total) by mouth 2 (two) times daily for 7 days.   febuxostat 40 MG tablet Commonly known as: Uloric Take 1 tablet (40 mg total) by mouth daily.   Ferrex 150 150 MG capsule Generic drug: iron polysaccharides Take 150 mg by mouth 2 (two) times daily.   hydrALAZINE 100 MG  tablet Commonly known as: APRESOLINE Take 100 mg by mouth 2 (two) times daily.   latanoprost 0.005 % ophthalmic solution Commonly known as: XALATAN Place 1 drop into both eyes at bedtime.   loperamide 2 MG capsule Commonly known as: IMODIUM Take 2 mg by mouth as needed for diarrhea or loose stools.   metFORMIN 500 MG 24 hr tablet Commonly known as: GLUCOPHAGE-XR Take 500 mg by mouth 2 (two) times daily.   metoprolol succinate 50 MG 24 hr tablet Commonly known as: TOPROL-XL Take 50 mg by mouth daily. Take with or immediately following a meal.   multivitamin tablet Take 1 tablet by mouth in the morning.   OneTouch Ultra test strip Generic drug: glucose blood USE 1 STRIP TO CHECK GLUCOSE ONCE DAILY   prochlorperazine 10 MG tablet Commonly known as: COMPAZINE Take 1 tablet (10 mg total) by mouth every 6 (six) hours as needed for nausea or vomiting.   SSD 1 % cream Generic drug: silver sulfADIAZINE Apply topically daily.   Tylenol 325 MG tablet Generic drug: acetaminophen Take 650 mg by mouth every 6 (six) hours as needed (pain or fever).   urea 10 % cream Commonly known as: CARMOL Apply topically 2 (two) times daily. Apply topically to hands twice a day               Durable Medical Equipment  (  From admission, onward)           Start     Ordered   11/21/21 1306  For home use only DME 3 n 1  Once       Comments: Bed Side Commode   11/21/21 1306            Discharge Assessment: Vitals:   11/21/21 0438 11/21/21 0945  BP: (!) 187/70 (!) 158/62  Pulse: (!) 59 62  Resp: 18 18  Temp: 98.6 F (37 C) 97.9 F (36.6 C)  SpO2: 96% 96%   Skin clean, dry and intact without evidence of skin break down, no evidence of skin tears noted. IV catheter discontinued intact. Site without signs and symptoms of complications - no redness or edema noted at insertion site, patient denies c/o pain - only slight tenderness at site.  Dressing with slight pressure  applied.  D/c Instructions-Education: Discharge instructions given to patient/family with verbalized understanding. D/c education completed with patient/family including follow up instructions, medication list, d/c activities limitations if indicated, with other d/c instructions as indicated by MD - patient able to verbalize understanding, all questions fully answered. Patient instructed to return to ED, call 911, or call MD for any changes in condition.  Patient escorted via [place], and D/C home via private auto.  Santa Lighter, RN 11/21/2021 1:33 PM

## 2021-11-21 NOTE — Evaluation (Addendum)
Physical Therapy Evaluation Patient Details Name: [name] [name] MRN: [id number] DOB: 04/18/1940 Today's Date: 11/21/2021  History of Present Illness  [name] [name] is a 81 y.o. male with medical history significant of stage III (T1N1B) transverse colon adenocarcinoma, CAD, paroxysmal atrial fibrillation on Eliquis, hypertension, hyperlipidemia who presents to the emergency department via EMS due to altered mental status . patient was febrile with a temperature of 103.8, respiratory rate was 22/min, pulse 82 bpm, BP 163/61, O2 sat 99%. SARS coronavirus 2 was negative.  Blood culture pending CT head without contrast  no acute stroke.   Chest x-ray showed no acute disease.   Clinical Impression  Patient supine in bed and wife present upon therapist arrival and agreeable to participating in PT evaluation today. Patient and wife report complete independence prior to admission until sudden onset of >103 degree temp. Patient reports he is feeling much better today. Patient able to perform all mobility with modified independence to min guard for safety in unfamiliar environment. Patient exhibited fair tolerance for activity being limited by fatigue ambulating with SPC with fair balance demonstrated. Patient on room air throughout session. PT reviewed supine and sitting home exercise programs that patient is fairly familiar with from previous HHPT bouts. Wife reports she has carried out HEPs with her history of home care. Patient agreeable to sitting in chair at end of session - nursing notified. Patient evaluated by Physical Therapy with no further acute PT needs identified. All education has been completed and the patient has no further questions.  See below for any follow-up Physical Therapy or equipment needs. PT is signing off. Thank you for this referral.        Recommendations for follow up therapy are one component of a multi-disciplinary discharge planning process, led by the attending physician.   Recommendations may be updated based on patient status, additional functional criteria and insurance authorization.  Follow Up Recommendations No PT follow up      Assistance Recommended at Discharge PRN  Patient can return home with the following  Assistance with cooking/housework;Help with stairs or ramp for entrance    Equipment Recommendations BSC/3in1  Recommendations for Other Services       Functional Status Assessment Patient has had a recent decline in their functional status and demonstrates the ability to make significant improvements in function in a reasonable and predictable amount of time.     Precautions / Restrictions Precautions Precautions: Fall Precaution Comments: patient reports no falls in the last six months Restrictions Weight Bearing Restrictions: No      Mobility  Bed Mobility Overal bed mobility: Modified Independent      Transfers Overall transfer level: Needs assistance Equipment used: Straight cane Transfers: Sit to/from Stand, Bed to chair/wheelchair/BSC Sit to Stand: Supervision, Min guard   Step pivot transfers: Min guard, Supervision       General transfer comment: min guard due to unfamiliar environment only    Ambulation/Gait Ambulation/Gait assistance: Min guard, Supervision Gait Distance (Feet): 200 Feet Assistive device: Straight cane Gait Pattern/deviations: Step-through pattern, Decreased step length - left, Decreased stance time - right, Decreased stride length, Wide base of support Gait velocity: decreased     General Gait Details: somewhat slow cadence using SPC and equal length steps with hips externally rotated and out toeing; limited by fatigue; on room air throughout  Stairs            Wheelchair Mobility    Modified Rankin (Stroke Patients Only)  Balance Overall balance assessment: Modified Independent Sitting-balance support: No upper extremity supported, Feet supported Sitting  balance-Leahy Scale: Good     Standing balance support: Single extremity supported, During functional activity, Reliant on assistive device for balance Standing balance-Leahy Scale: Fair Standing balance comment: fair with SPC         Pertinent Vitals/Pain Pain Assessment Pain Assessment: No/denies pain    Home Living Family/patient expects to be discharged to:: Private residence Living Arrangements: Spouse/significant other Available Help at Discharge: Family;Available 24 hours/day Type of Home: House Home Access: Stairs to enter Entrance Stairs-Rails: Right;Left;Can reach both Entrance Stairs-Number of Steps: 3   Home Layout: One level Home Equipment: Hand held shower head;Cane - single point;Grab bars - tub/shower;Rolling Walker (2 wheels)      Prior Function Prior Level of Function : Driving;Independent/Modified Independent       Hand Dominance        Extremity/Trunk Assessment   Upper Extremity Assessment Upper Extremity Assessment: Defer to OT evaluation    Lower Extremity Assessment Lower Extremity Assessment: Overall WFL for tasks assessed       Communication      Cognition Arousal/Alertness: Awake/alert Behavior During Therapy: WFL for tasks assessed/performed Overall Cognitive Status: Within Functional Limits for tasks assessed        General Comments      Exercises     Assessment/Plan    PT Assessment Patient does not need any further PT services  PT Problem List         PT Treatment Interventions      PT Goals (Current goals can be found in the Care Plan section)       Frequency          AM-PAC PT "6 Clicks" Mobility  Outcome Measure Help needed turning from your back to your side while in a flat bed without using bedrails?: None Help needed moving from lying on your back to sitting on the side of a flat bed without using bedrails?: None Help needed moving to and from a bed to a chair (including a wheelchair)?: A  Little Help needed standing up from a chair using your arms (e.g., wheelchair or bedside chair)?: A Little Help needed to walk in hospital room?: A Little Help needed climbing 3-5 steps with a railing? : A Little 6 Click Score: 20    End of Session Equipment Utilized During Treatment: Gait belt Activity Tolerance: Patient tolerated treatment well;Patient limited by fatigue Patient left: in chair;with call bell/phone within reach;with family/visitor present Nurse Communication: Mobility status PT Visit Diagnosis: Difficulty in walking, not elsewhere classified (R26.2)    Time: 2010-0712 PT Time Calculation (min) (ACUTE ONLY): 28 min   Charges:   PT Evaluation $PT Eval Low Complexity: 1 Low PT Treatments $Therapeutic Activity: 8-22 mins        [name] Raveling. Hartnett-Rands, MS, PT [place] [phone]  [name] Hurry  [name] 11/21/2021, 12:29 PM

## 2021-11-21 NOTE — TOC Progression Note (Signed)
Transition of Care [facility]) - Progression Note    Patient Details  Name: [name] [name] MRN: [id number] Date of Birth: October 29, 1940  [facility]) CM/SW Contact  Boneta Lucks, RN Phone Number: 11/21/2021, 1:07 PM  Clinical Narrative:   Due to weakness, PT is recommending a Bed Side commode due to being confined to one level of the home and toilet in another area of the home. Orders placed.    Expected Discharge Plan: Home/Self Care Barriers to Discharge: Continued Medical Work up  Expected Discharge Plan and Services Expected Discharge Plan: Home/Self Care     Post Acute Care Choice: Durable Medical Equipment Living arrangements for the past 2 months: Single Family Home Expected Discharge Date: 11/21/21               DME Arranged: 3-N-1 DME Agency: [employer] Date DME Agency Contacted: 11/21/21 Time DME Agency Contacted: 1219 Representative spoke with at DME Agency: Thedore Mins

## 2021-11-21 NOTE — Plan of Care (Signed)

## 2021-11-21 NOTE — Plan of Care (Signed)
  Problem: Education: Goal: Knowledge of General Education information will improve Description: Including pain rating scale, medication(s)/side effects and non-pharmacologic comfort measures 11/21/2021 1249 by Santa Lighter, RN Outcome: Adequate for Discharge 11/21/2021 1055 by Santa Lighter, RN Outcome: Progressing   Problem: Health Behavior/Discharge Planning: Goal: Ability to manage health-related needs will improve 11/21/2021 1249 by Santa Lighter, RN Outcome: Adequate for Discharge 11/21/2021 1055 by Santa Lighter, RN Outcome: Progressing   Problem: Clinical Measurements: Goal: Ability to maintain clinical measurements within normal limits will improve 11/21/2021 1249 by Santa Lighter, RN Outcome: Adequate for Discharge 11/21/2021 1055 by Santa Lighter, RN Outcome: Progressing Goal: Will remain free from infection Outcome: Adequate for Discharge Goal: Diagnostic test results will improve Outcome: Adequate for Discharge Goal: Respiratory complications will improve Outcome: Adequate for Discharge Goal: Cardiovascular complication will be avoided Outcome: Adequate for Discharge   Problem: Activity: Goal: Risk for activity intolerance will decrease Outcome: Adequate for Discharge   Problem: Nutrition: Goal: Adequate nutrition will be maintained Outcome: Adequate for Discharge   Problem: Coping: Goal: Level of anxiety will decrease Outcome: Adequate for Discharge   Problem: Elimination: Goal: Will not experience complications related to bowel motility Outcome: Adequate for Discharge Goal: Will not experience complications related to urinary retention Outcome: Adequate for Discharge   Problem: Pain Managment: Goal: General experience of comfort will improve Outcome: Adequate for Discharge   Problem: Safety: Goal: Ability to remain free from injury will improve Outcome: Adequate for Discharge   Problem: Skin Integrity: Goal: Risk for impaired skin integrity  will decrease Outcome: Adequate for Discharge   Problem: Education: Goal: Ability to describe self-care measures that may prevent or decrease complications (Diabetes Survival Skills Education) will improve Outcome: Adequate for Discharge Goal: Individualized Educational Video(s) Outcome: Adequate for Discharge   Problem: Coping: Goal: Ability to adjust to condition or change in health will improve Outcome: Adequate for Discharge   Problem: Fluid Volume: Goal: Ability to maintain a balanced intake and output will improve Outcome: Adequate for Discharge   Problem: Health Behavior/Discharge Planning: Goal: Ability to identify and utilize available resources and services will improve Outcome: Adequate for Discharge Goal: Ability to manage health-related needs will improve Outcome: Adequate for Discharge   Problem: Metabolic: Goal: Ability to maintain appropriate glucose levels will improve Outcome: Adequate for Discharge   Problem: Nutritional: Goal: Maintenance of adequate nutrition will improve Outcome: Adequate for Discharge Goal: Progress toward achieving an optimal weight will improve Outcome: Adequate for Discharge   Problem: Skin Integrity: Goal: Risk for impaired skin integrity will decrease Outcome: Adequate for Discharge   Problem: Tissue Perfusion: Goal: Adequacy of tissue perfusion will improve Outcome: Adequate for Discharge

## 2021-11-21 NOTE — TOC Initial Note (Signed)
Transition of Care [facility]) - Initial/Assessment Note    Patient Details  Name: [name] [name] MRN: [id number] Date of Birth: 15-Feb-1941  Transition of [facility]) CM/SW Contact:    Boneta Lucks, RN Phone Number: 11/21/2021, 12:19 PM  Clinical Narrative:        Patient admitted with acute febrile illness. Patient lives and home with his wife, independent at baseline. PT had not PT follow up recommendation. Patient does need a 3N1, order placed and Zach with [employer] will drop ship.            Expected Discharge Plan: Home/Self Care Barriers to Discharge: Continued Medical Work up   Patient Goals and CMS Choice Patient states their goals for this hospitalization and ongoing recovery are:: to go home. CMS Medicare.gov Compare Post Acute Care list provided to:: Patient Choice offered to / list presented to : Patient  Expected Discharge Plan and Services Expected Discharge Plan: Home/Self Care     Post Acute Care Choice: Durable Medical Equipment Living arrangements for the past 2 months: Single Family Home                 DME Arranged: 3-N-1 DME Agency: [employer] Date DME Agency Contacted: 11/21/21 Time DME Agency Contacted: 1219 Representative spoke with at DME Agency: Thedore Mins     Prior Living Arrangements/Services Living arrangements for the past 2 months: [place] with:: Spouse Patient language and need for interpreter reviewed:: Yes Do you feel safe going back to the place where you live?: Yes      Need for Family Participation in Patient Care: Yes (Comment) Care giver support system in place?: Yes (comment)   Criminal Activity/Legal Involvement Pertinent to Current Situation/Hospitalization: No - Comment as needed  Activities of Daily Living Home Assistive Devices/Equipment: Dentures (specify type), Cane (specify quad or straight) ADL Screening (condition at time of admission) Patient's cognitive ability adequate to safely complete daily activities?:  Yes Is the patient deaf or have difficulty hearing?: Yes Does the patient have difficulty seeing, even when wearing glasses/contacts?: No Does the patient have difficulty concentrating, remembering, or making decisions?: No Patient able to express need for assistance with ADLs?: Yes Does the patient have difficulty dressing or bathing?: No Independently performs ADLs?: Yes (appropriate for developmental age) Does the patient have difficulty walking or climbing stairs?: No Weakness of Legs: None Weakness of Arms/Hands: None  Permission Sought/Granted     Emotional Assessment     Affect (typically observed): Accepting Orientation: : Oriented to Self, Oriented to Place, Oriented to  Time, Oriented to Situation Alcohol / Substance Use: Not Applicable Psych Involvement: No (comment)  Admission diagnosis:  Acute febrile illness [R50.9] Altered mental status, unspecified altered mental status type [R41.82] Patient Active Problem List   Diagnosis Date Noted   Altered mental status, unspecified 11/20/2021   Thrombocytopenia ([place]) 11/20/2021   Elevated troponin 11/20/2021   Macrocytic anemia 11/20/2021   Hyponatremia 11/20/2021   Acute febrile illness 11/19/2021   Essential hypertension 04/19/2021   Pre-op testing 04/19/2021   Colon cancer ([place]) 01/31/2021   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) 12/08/2015   Sinus bradycardia 01/14/2014   Pericardial effusion 12/31/2013   Paroxysmal atrial fibrillation ([place])    Supraventricular tachycardia ([place])  10/17/2010   PCP:  Caryl Bis, MD Pharmacy:   [facility] [address] [place] [address] Phone: [phone] Fax: [phone]   Readmission Risk Interventions    11/21/2021   12:18 PM  Readmission Risk Prevention Plan  Transportation Screening Complete  PCP or Specialist Appt within 3-5 Days Not Complete  HRI or [place] Complete  Social Work Consult for [place] Planning/Counseling Complete  Palliative Care Screening Not Applicable  Medication Review [employer] photographer) Complete

## 2021-11-22 NOTE — Discharge Summary (Signed)
Physician Discharge Summary   Patient: [name] [name] MRN: [id number] DOB: 1941/02/13  Admit date:     11/19/2021  Discharge date: {dischdate:26783}  Discharge Physician: Hosie Poisson   PCP: Caryl Bis, MD   Recommendations at discharge:    Discharge Diagnoses: Principal Problem:   Acute febrile illness Active Problems:   PAF (paroxysmal atrial fibrillation) ([facility])   Mixed hyperlipidemia   Essential hypertension   Uncontrolled type 2 diabetes mellitus with hyperglycemia, without long-term current use of insulin (HCC)   Altered mental status, unspecified   Thrombocytopenia (HCC)   Elevated troponin   Macrocytic anemia   Hyponatremia  Resolved Problems:   * No resolved hospital problems. [facility] Course: No notes on file  Assessment and Plan: No notes have been filed under this hospital service. Service: Hospitalist     {Tip this will not be part of the note when signed Body mass index is 29.23 kg/m. , ,  (Optional):26781}  {(NOTE) Pain control PDMP Statment (Optional):26782} Consultants: *** Procedures performed: ***  Disposition: {Plan; Disposition:26390} Diet recommendation:  Discharge Diet Orders (From admission, onward)     Start     Ordered   11/21/21 0000  Diet - low sodium heart healthy        11/21/21 1220           {Diet_Plan:26776} DISCHARGE MEDICATION: Allergies as of 11/21/2021       Reactions   Amiodarone Rash        Medication List     STOP taking these medications    capecitabine 500 MG tablet Commonly known as: Xeloda   hydrochlorothiazide 12.5 MG tablet Commonly known as: HYDRODIURIL       TAKE these medications    amoxicillin-clavulanate 875-125 MG tablet Commonly known as: AUGMENTIN Take 1 tablet by mouth 2 (two) times daily for 7 days.   apixaban 5 MG Tabs tablet Commonly known as: ELIQUIS Take 1 tablet (5 mg total) by mouth 2 (two) times daily.   ascorbic acid 500 MG tablet Commonly known as: VITAMIN  C Take 500 mg by mouth 2 (two) times daily.   atorvastatin 80 MG tablet Commonly known as: LIPITOR TAKE 1 TABLET BY MOUTH ONCE DAILY AT  6  PM What changed: See the new instructions.   benazepril 40 MG tablet Commonly known as: LOTENSIN TAKE 1 TABLET BY MOUTH ONCE DAILY** NEEDS  OFFICE  VISIT What changed: See the new instructions.   cyanocobalamin 1000 MCG tablet Take 1 tablet (1,000 mcg total) by mouth daily.   diltiazem 180 MG 24 hr capsule Commonly known as: CARDIZEM CD Take 1 capsule (180 mg total) by mouth daily.   dorzolamide 2 % ophthalmic solution Commonly known as: TRUSOPT Place 1 drop into both eyes 2 (two) times daily.   doxycycline 50 MG capsule Commonly known as: VIBRAMYCIN Take 2 capsules (100 mg total) by mouth 2 (two) times daily for 7 days.   febuxostat 40 MG tablet Commonly known as: Uloric Take 1 tablet (40 mg total) by mouth daily.   Ferrex 150 150 MG capsule Generic drug: iron polysaccharides Take 150 mg by mouth 2 (two) times daily.   hydrALAZINE 100 MG tablet Commonly known as: APRESOLINE Take 100 mg by mouth 2 (two) times daily.   latanoprost 0.005 % ophthalmic solution Commonly known as: XALATAN Place 1 drop into both eyes at bedtime.   loperamide 2 MG capsule Commonly known as: IMODIUM Take 2 mg by mouth as needed for diarrhea or  loose stools.   metFORMIN 500 MG 24 hr tablet Commonly known as: GLUCOPHAGE-XR Take 500 mg by mouth 2 (two) times daily.   metoprolol succinate 50 MG 24 hr tablet Commonly known as: TOPROL-XL Take 50 mg by mouth daily. Take with or immediately following a meal.   multivitamin tablet Take 1 tablet by mouth in the morning.   OneTouch Ultra test strip Generic drug: glucose blood USE 1 STRIP TO CHECK GLUCOSE ONCE DAILY   prochlorperazine 10 MG tablet Commonly known as: COMPAZINE Take 1 tablet (10 mg total) by mouth every 6 (six) hours as needed for nausea or vomiting.   SSD 1 % cream Generic drug:  silver sulfADIAZINE Apply topically daily.   Tylenol 325 MG tablet Generic drug: acetaminophen Take 650 mg by mouth every 6 (six) hours as needed (pain or fever).   urea 10 % cream Commonly known as: CARMOL Apply topically 2 (two) times daily. Apply topically to hands twice a day        Follow-up Information     [facility] Follow up.   Why: equipment will be delivered to your home. Contact information: [place] [address] [phone]                Discharge Exam: [name] Weights   11/19/21 2040 11/20/21 0234  Weight: 95.6 kg 96.4 kg   ***  Condition at discharge: {DC Condition:26389}  The results of significant diagnostics from this hospitalization (including imaging, microbiology, ancillary and laboratory) are listed below for reference.   Imaging Studies: CT Head Wo Contrast  Result Date: 11/19/2021 CLINICAL DATA:  Altered mental status, fever, possible sepsis EXAM: CT HEAD WITHOUT CONTRAST TECHNIQUE: Contiguous axial images were obtained from the base of the skull through the vertex without intravenous contrast. RADIATION DOSE REDUCTION: This exam was performed according to the departmental dose-optimization program which includes automated exposure control, adjustment of the mA and/or kV according to patient size and/or use of iterative reconstruction technique. COMPARISON:  None Available. FINDINGS: Brain: No evidence of acute infarction, hemorrhage, hydrocephalus, extra-axial collection or mass lesion/mass effect. Vascular: Intracranial atherosclerosis. Skull: Normal. Negative for fracture or focal lesion. Sinuses/Orbits: The visualized paranasal sinuses are essentially clear. The mastoid air cells are unopacified. Other: None. IMPRESSION: Normal head CT. Electronically Signed   By: Julian Hy M.D.   On: 11/19/2021 23:16   DG Chest 1 View  Result Date: 11/19/2021 CLINICAL DATA:  Fever. EXAM: CHEST  1 VIEW COMPARISON:  Chest  x-ray 05/20/2021 FINDINGS: The heart size and mediastinal contours are within normal limits. Both lungs are clear. The visualized skeletal structures are unremarkable. IMPRESSION: No active disease. Electronically Signed   By: Ronney Asters M.D.   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 Status post chemotherapy. Prostate cancer with seed implants. * Tracking Code: BO * EXAM: CT CHEST, ABDOMEN, AND PELVIS WITH CONTRAST TECHNIQUE: Multidetector CT imaging of the chest, abdomen and pelvis was performed following the standard protocol during bolus administration of intravenous contrast. RADIATION DOSE REDUCTION: This exam was performed according to the departmental dose-optimization program which includes automated exposure control, adjustment of the mA and/or kV according to patient size and/or use of iterative reconstruction technique. CONTRAST:  113m OMNIPAQUE IOHEXOL 300 MG/ML  SOLN COMPARISON:  05/21/2021 postoperative abdominopelvic CT. 03/18/2021 staging chest abdomen and pelvic CT. FINDINGS: CT CHEST FINDINGS Cardiovascular: Aortic atherosclerosis. Mild cardiomegaly with small  pericardial effusion, similar. Left main and 3 vessel coronary artery calcification. No central pulmonary embolism, on this non-dedicated study. Mediastinum/Nodes: No mediastinal or hilar adenopathy. Tiny hiatal hernia. Lungs/Pleura: trace right pleural fluid. 4 mm inferior lateral right middle lobe subpleural pulmonary nodule on 100/4 is similar to the prior. A vague 3 mm right lower lobe pulmonary nodule on 73/4 is also not significantly changed. The nodule of interest on the prior exam is similar at 5 mm in the subpleural left lower lobe on 108/4. Tiny subpleural superior segment left lower lobe pulmonary nodules x2 including at up to 3 mm on 64/4 unchanged. Musculoskeletal: No acute osseous abnormality. CT ABDOMEN PELVIS FINDINGS Hepatobiliary:  Small bilateral liver lesions are similar in size and distribution. Many of these are too small to characterize. The larger lesions, for example in the posterior aspect of segment 2 at 1.8 cm are consistent with cysts. No enlarging suspicious liver lesion identified. Normal gallbladder, without biliary ductal dilatation. Pancreas: Cystic lesion within the pancreatic neck measures 11 mm on 63/2 is felt to be more conspicuous than on the prior. No duct dilatation or acute inflammation. Spleen: Normal in size, without focal abnormality. Adrenals/Urinary Tract: Normal adrenal glands. Punctate interpolar left renal collecting system calculus. Bilateral too small to characterize renal lesions. Interpolar left renal 1.2 cm lesion measures slightly greater than fluid density on 81/2 and 40/3. Not readily apparent on 03/18/2021. No hydronephrosis. Normal urinary bladder. Stomach/Bowel: Normal remainder of the stomach. Right hemicolectomy. No local recurrence. Prior enterotomy.  Otherwise normal small bowel Vascular/Lymphatic: Advanced aortic and branch vessel atherosclerosis. Ileocolic mesenteric nodes are clustered on 87/2, measuring maximally 1.0 x 1.5 cm, new since the prior CT. No pelvic sidewall adenopathy. Reproductive: Radiation seeds in the prostate. Other: Small bilateral fat containing inguinal hernias. No significant free fluid. Subtle omental thickening including on 75/2 and adjacent to the liver on 57/2. No well-defined omental nodularity. Musculoskeletal: Subcentimeter left sacral sclerotic lesion is unchanged, favoring a bone island. Mild osteopenia. IMPRESSION: CT CHEST IMPRESSION 1.  No acute process or evidence of metastatic disease in the chest. 2. Bilateral pulmonary nodules, including the left lower lobe nodule of interest, are unchanged, favored to be benign. 3. Coronary artery atherosclerosis. Aortic Atherosclerosis (ICD10-I70.0). Similar small pericardial effusion. 4. Trace right pleural fluid. CT  ABDOMEN AND PELVIS IMPRESSION 1. Status post right hemicolectomy. Developing adenopathy in the ileocolic mesentery, highly suspicious for nodal metastasis. 2. Subtle interstitial thickening within the omentum could be postoperative or represent early peritoneal metastasis. Recommend attention on follow-up. 3. Developing left renal and pancreatic cystic lesions are of doubtful clinical significance, given comorbidities. Recommend attention on follow-up. 4. Left nephrolithiasis. Electronically Signed   By: Abigail Miyamoto M.D.   On: 11/09/2021 14:59    Microbiology: Results for orders placed or performed during the hospital encounter of 11/19/21  Urine Culture     Status: None   Collection Time: 11/19/21  8:43 PM   Specimen: Urine, Clean Catch  Result Value Ref Range Status   Specimen Description   Final    URINE, CLEAN CATCH Performed at [facility], [address] [place], [place] [address]    Special Requests   Final    NONE Performed at [facility], [address], [place], [place] [address]    Culture   Final    NO GROWTH Performed at [facility], [place] [address], [place], [place] [address]    Report Status 11/21/2021 FINAL  Final  Culture, blood (Routine x  2)     Status: None (Preliminary result)   Collection Time: 11/19/21  8:45 PM   Specimen: Right Antecubital; Blood  Result Value Ref Range Status   Specimen Description RIGHT ANTECUBITAL  Final   Special Requests   Final    BOTTLES DRAWN AEROBIC AND ANAEROBIC Blood Culture adequate volume   Culture   Final    NO GROWTH 3 DAYS Performed at [facility], [address], [place], [place] [address]    Report Status PENDING  Incomplete  Culture, blood (Routine x 2)     Status: None (Preliminary result)   Collection Time: 11/19/21  8:45 PM   Specimen: Left Antecubital; Blood  Result Value Ref Range Status   Specimen Description LEFT ANTECUBITAL  Final   Special Requests   Final    BOTTLES DRAWN AEROBIC AND ANAEROBIC Blood  Culture results may not be optimal due to an inadequate volume of blood received in culture bottles   Culture   Final    NO GROWTH 3 DAYS Performed at [facility], [address], [place], [place] [address]    Report Status PENDING  Incomplete  SARS Coronavirus 2 by RT PCR (hospital order, performed in [facility] hospital lab) *cepheid single result test* Anterior Nasal Swab     Status: None   Collection Time: 11/19/21  8:50 PM   Specimen: Anterior Nasal Swab  Result Value Ref Range Status   SARS Coronavirus 2 by RT PCR NEGATIVE NEGATIVE Final    Comment: (NOTE) SARS-CoV-2 target nucleic acids are NOT DETECTED.  The SARS-CoV-2 RNA is generally detectable in upper and lower respiratory specimens during the acute phase of infection. The lowest concentration of SARS-CoV-2 viral copies this assay can detect is 250 copies / mL. A negative result does not preclude SARS-CoV-2 infection and should not be used as the sole basis for treatment or other patient management decisions.  A negative result may occur with improper specimen collection / handling, submission of specimen other than nasopharyngeal swab, presence of viral mutation(s) within the areas targeted by this assay, and inadequate number of viral copies (<250 copies / mL). A negative result must be combined with clinical observations, patient history, and epidemiological information.  Fact Sheet for Patients:   https://www.patel.info/  Fact Sheet for Healthcare Providers: https://hall.com/  This test is not yet approved or  cleared by the [place] FDA and has been authorized for detection and/or diagnosis of SARS-CoV-2 by FDA under an Emergency Use Authorization (EUA).  This EUA will remain in effect (meaning this test can be used) for the duration of the COVID-19 declaration under Section 564(b)(1) of the Act, 21 U.S.C. section 360bbb-3(b)(1), unless the authorization is  terminated or revoked sooner.  Performed at [facility], [address], [place], [place] [address]   Respiratory (~20 pathogens) panel by PCR     Status: None   Collection Time: 11/19/21 11:47 PM   Specimen: Nasopharyngeal Swab; Respiratory  Result Value Ref Range Status   Adenovirus NOT DETECTED NOT DETECTED Final   Coronavirus 229E NOT DETECTED NOT DETECTED Final    Comment: (NOTE) The Coronavirus on the Respiratory Panel, DOES NOT test for the novel  Coronavirus (2019 nCoV)    Coronavirus HKU1 NOT DETECTED NOT DETECTED Final   Coronavirus NL63 NOT DETECTED NOT DETECTED Final   Coronavirus OC43 NOT DETECTED NOT DETECTED Final   Metapneumovirus NOT DETECTED NOT DETECTED Final   Rhinovirus / Enterovirus NOT DETECTED NOT DETECTED Final   Influenza A  NOT DETECTED NOT DETECTED Final   Influenza B NOT DETECTED NOT DETECTED Final   Parainfluenza Virus 1 NOT DETECTED NOT DETECTED Final   Parainfluenza Virus 2 NOT DETECTED NOT DETECTED Final   Parainfluenza Virus 3 NOT DETECTED NOT DETECTED Final   Parainfluenza Virus 4 NOT DETECTED NOT DETECTED Final   Respiratory Syncytial Virus NOT DETECTED NOT DETECTED Final   Bordetella pertussis NOT DETECTED NOT DETECTED Final   Bordetella Parapertussis NOT DETECTED NOT DETECTED Final   Chlamydophila pneumoniae NOT DETECTED NOT DETECTED Final   Mycoplasma pneumoniae NOT DETECTED NOT DETECTED Final    Comment: Performed at [facility], [place] [address], [place],  [address]    Labs: CBC: Recent Labs  Lab 11/19/21 2045 11/20/21 0423 11/21/21 0444  WBC 10.9* 10.2 7.3  NEUTROABS 9.2*  --  5.0  HGB 13.4 11.4* 11.8*  HCT 39.9 33.4* 34.8*  MCV 100.5* 100.3* 100.6*  PLT 144* 109* 194*   Basic Metabolic Panel: Recent Labs  Lab 11/19/21 2045 11/20/21 0423 11/21/21 0444  NA 134* 136 138  K 3.5 2.8* 4.0  CL 100 106 109  CO2 '24 23 23  '$ GLUCOSE 230* 155* 141*  BUN '23 20 12  '$ CREATININE 1.04 0.86 0.79  CALCIUM 9.3 8.6* 8.7*  MG   --  1.8  --   PHOS  --  3.7  --    Liver Function Tests: Recent Labs  Lab 11/19/21 2045 11/20/21 0423  AST 26 21  ALT 22 18  ALKPHOS 76 55  BILITOT 0.8 0.6  PROT 7.5 5.9*  ALBUMIN 4.3 3.3*   CBG: Recent Labs  Lab 11/20/21 1116 11/20/21 1640 11/20/21 2151 11/21/21 0755 11/21/21 1146  GLUCAP 196* 127* 162* 149* 239*    Discharge time spent: {LESS THAN/GREATER THAN:26388} 30 minutes.  Signed: Hosie Poisson, MD [facility] 11/22/2021

## 2021-11-24 ENCOUNTER — Encounter (HOSPITAL_COMMUNITY)
Admission: RE | Admit: 2021-11-24 | Discharge: 2021-11-24 | Disposition: A | Discharge status: Home / Self Care | Payer: Medicare HMO | Source: Ambulatory Visit | Attending: Hematology | Admitting: Hematology

## 2021-11-24 DIAGNOSIS — C184 Malignant neoplasm of transverse colon: Secondary | ICD-10-CM | POA: Insufficient documentation

## 2021-11-24 DIAGNOSIS — I2510 Atherosclerotic heart disease of native coronary artery without angina pectoris: Secondary | ICD-10-CM | POA: Diagnosis not present

## 2021-11-24 DIAGNOSIS — J90 Pleural effusion, not elsewhere classified: Secondary | ICD-10-CM | POA: Diagnosis not present

## 2021-11-24 DIAGNOSIS — C189 Malignant neoplasm of colon, unspecified: Secondary | ICD-10-CM | POA: Diagnosis not present

## 2021-11-24 DIAGNOSIS — I3139 Other pericardial effusion (noninflammatory): Secondary | ICD-10-CM | POA: Diagnosis not present

## 2021-11-24 LAB — CULTURE, BLOOD (ROUTINE X 2)
Culture: NO GROWTH
Culture: NO GROWTH
Special Requests: ADEQUATE

## 2021-11-24 MED ORDER — FLUDEOXYGLUCOSE F - 18 (FDG) INJECTION
10.9600 | Freq: Once | INTRAVENOUS | Status: AC | PRN
  Administered 2021-11-24: 10.96 via INTRAVENOUS

## 2021-11-30 ENCOUNTER — Other Ambulatory Visit (HOSPITAL_COMMUNITY): Payer: Self-pay

## 2021-12-01 ENCOUNTER — Inpatient Hospital Stay: Payer: Medicare HMO | Attending: Hematology | Admitting: Hematology

## 2021-12-01 ENCOUNTER — Other Ambulatory Visit (HOSPITAL_COMMUNITY): Payer: Self-pay | Admitting: Pharmacist

## 2021-12-01 ENCOUNTER — Other Ambulatory Visit (HOSPITAL_COMMUNITY): Payer: Self-pay

## 2021-12-01 ENCOUNTER — Other Ambulatory Visit: Payer: Self-pay | Admitting: Hematology

## 2021-12-01 ENCOUNTER — Other Ambulatory Visit: Payer: Self-pay | Admitting: Cardiology

## 2021-12-01 VITALS — BP 119/84 | HR 56 | Temp 97.9°F | Resp 18 | Ht 71.0 in | Wt 210.0 lb

## 2021-12-01 DIAGNOSIS — Z8546 Personal history of malignant neoplasm of prostate: Secondary | ICD-10-CM | POA: Insufficient documentation

## 2021-12-01 DIAGNOSIS — C184 Malignant neoplasm of transverse colon: Secondary | ICD-10-CM

## 2021-12-01 MED ORDER — CAPECITABINE 500 MG PO TABS: 1000.0000 mg | ORAL_TABLET | Freq: Two times a day (BID) | ORAL | 2 refills | Status: DC

## 2021-12-01 MED ORDER — CAPECITABINE 500 MG PO TABS
1000.0000 mg | ORAL_TABLET | Freq: Two times a day (BID) | ORAL | 1 refills | Status: DC
  Filled 2021-12-01: qty 56, 14d supply, fill #0

## 2021-12-01 MED ORDER — CAPECITABINE 500 MG PO TABS
1000.0000 mg | ORAL_TABLET | Freq: Two times a day (BID) | ORAL | 0 refills | Status: DC
  Filled 2021-12-01: qty 40, 10d supply, fill #0

## 2021-12-01 MED ORDER — CAPECITABINE 500 MG PO TABS
1000.0000 mg | ORAL_TABLET | Freq: Two times a day (BID) | ORAL | 2 refills | Status: DC
  Filled 2021-12-01: qty 56, 14d supply, fill #0

## 2021-12-01 NOTE — Progress Notes (Signed)
Patient fills Xeloda at [facility] prescription redirected there.

## 2021-12-01 NOTE — Patient Instructions (Addendum)
[name] [name] at [facility] Discharge Instructions   You were seen and examined today by Dr. Delton Coombes.  He reviewed the results of your PET scan which were normal/stable.   He reviewed the results of your lab work which are normal/stable.   Restart Xeloda, two pills twice a day. We will do this for 3 more cycles to complete 8 cycles.   Return as scheduled in 3 weeks.      Thank you for choosing [facility] at [facility] to provide your oncology and hematology care.  To afford each patient quality time with our provider, please arrive at least 15 minutes before your scheduled appointment time.   If you have a lab appointment with the [facility] please come in thru the Main Entrance and check in at the main information desk.  You need to re-schedule your appointment should you arrive 10 or more minutes late.  We strive to give you quality time with our providers, and arriving late affects you and other patients whose appointments are after yours.  Also, if you no show three or more times for appointments you may be dismissed from the clinic at the providers discretion.     Again, thank you for choosing [facility].  Our hope is that these requests will decrease the amount of time that you wait before being seen by our physicians.       _____________________________________________________________  Should you have questions after your visit to [facility] please contact our office at [phone] and follow the prompts.  Our office hours are 8:00 a.m. and 4:30 p.m. Monday - Friday.  Please note that voicemails left after 4:00 p.m. may not be returned until the following business day.  We are closed weekends and major holidays.  You do have access to a nurse 24-7, just call the main number to the clinic [phone] and do not press any options, hold on the line and a nurse will answer the phone.    For prescription  refill requests, have your pharmacy contact our office and allow 72 hours.    Due to Covid, you will need to wear a mask upon entering the hospital. If you do not have a mask, a mask will be given to you at the Main Entrance upon arrival. For doctor visits, patients may have 1 support person age 45 or older with them. For treatment visits, patients can not have anyone with them due to social distancing guidelines and our immunocompromised population.

## 2021-12-01 NOTE — Telephone Encounter (Signed)
Patient filled Xeloda at [facility], Rx redirected to [place]

## 2021-12-01 NOTE — Telephone Encounter (Signed)
He is to take 14 days on and 7 days off

## 2021-12-01 NOTE — Addendum Note (Signed)
Addended by: Darl Pikes on: 12/01/2021 03:54 PM   Modules accepted: Orders

## 2021-12-02 ENCOUNTER — Other Ambulatory Visit (HOSPITAL_COMMUNITY): Payer: Self-pay

## 2021-12-02 NOTE — Progress Notes (Signed)
[name] [name], [place] [address]   CLINIC:  Medical Oncology/Hematology  PCP:  [name] Bis, MD [address] [place] [address] [phone]   REASON FOR VISIT:  Follow-up for colon cancer  PRIOR THERAPY: none  NGS Results: not done  CURRENT THERAPY: surveillance  CANCER STAGING:  Cancer Staging  Colon cancer [facility]) Staging form: Colon and Rectum, AJCC 8th Edition - Clinical stage from 03/22/2021: Stage I (cT1, cN0, cM0) - Unsigned - Pathologic stage from 06/23/2021: Stage IIIA (pT1, pN1b, cM0) - Unsigned   INTERVAL HISTORY:  [name], a 81 y.o. male, seen for follow-up of colon cancer.  He has been off of Xeloda since last visit.  He had recent admission to the hospital briefly for febrile illness.  Source could not be found.  Hands and feet have completely healed up.  REVIEW OF SYSTEMS:  Review of Systems  Respiratory:  Positive for cough.   All other systems reviewed and are negative.   PAST MEDICAL/SURGICAL HISTORY:  Past Medical History:  Diagnosis Date   Anemia    Arthritis    CAD (coronary artery disease)    DES to circumflex 12/2017   Diabetes mellitus without complication (HCC)    GERD (gastroesophageal reflux disease)    Glaucoma    Gout    Headache    History of kidney stones    Hypertension    NSTEMI (non-ST elevated myocardial infarction) ([place]) 12/31/2017   Paroxysmal atrial fibrillation (HCC)    Pericardial effusion    Idiopathic, recurrent pericardial effusion s/p pericardial window.   Prostate cancer ([place]) 2007   S/P Seed implants    Supraventricular tachycardia ([place])    Temporal arteritis ([place])    Past Surgical History:  Procedure Laterality Date   BIOPSY  10/12/2020   Procedure: BIOPSY;  Surgeon: [name], MD;  [facility];  Service: Gastroenterology;;   BIOPSY  02/25/2021   Procedure: BIOPSY;  Surgeon: [name], MD;  [facility];   Service: Gastroenterology;;   BIOPSY  04/11/2021   Procedure: BIOPSY;  Surgeon: Irving Copas., MD;  Location: [facility];  Service: Gastroenterology;;   CATARACT EXTRACTION W/PHACO Left 06/07/2015   Procedure: CATARACT EXTRACTION PHACO AND INTRAOCULAR LENS PLACEMENT LEFT EYE CDE=8.00;  Surgeon: Tonny Branch, MD;  Location: [facility];  Service: Ophthalmology;  Laterality: Left;   CATARACT EXTRACTION W/PHACO Right 06/17/2015   Procedure: CATARACT EXTRACTION PHACO AND INTRAOCULAR LENS PLACEMENT RIGHT EYE CDE=11.09;  Surgeon: Tonny Branch, MD;  Location: [facility];  Service: Ophthalmology;  Laterality: Right;   COLONOSCOPY WITH PROPOFOL N/A 10/12/2020   Procedure: COLONOSCOPY WITH PROPOFOL;  Surgeon: [name], MD;  [facility];  Service: Gastroenterology;  Laterality: N/A;  10:35   COLONOSCOPY WITH PROPOFOL N/A 02/25/2021   Procedure: COLONOSCOPY WITH PROPOFOL;  Surgeon: [name], MD;  [facility];  Service: Gastroenterology;  Laterality: N/A;  8:10   CORONARY ANGIOPLASTY WITH STENT PLACEMENT  01/01/2018   CORONARY STENT INTERVENTION N/A 01/01/2018   Procedure: CORONARY STENT INTERVENTION;  Surgeon: Jettie Booze, MD;  Location: [facility];  Service: Cardiovascular;  Laterality: N/A;   ENDOSCOPIC MUCOSAL RESECTION N/A 04/11/2021   Procedure: ENDOSCOPIC MUCOSAL RESECTION;  Surgeon: Rush Landmark Telford Nab., MD;  [facility];  Service: Gastroenterology;  Laterality: N/A;   ENTEROSCOPY N/A 10/12/2020   Procedure: PUSH ENTEROSCOPY;  Surgeon: [name], MD;  [facility];  Service: Gastroenterology;  Laterality: N/A;   ENTEROSCOPY N/A 04/11/2021   Procedure: ENTEROSCOPY;  Surgeon: Rush Landmark Telford Nab., MD;  Location: [facility];  Service: Gastroenterology;  Laterality: N/A;   ESOPHAGOGASTRODUODENOSCOPY (EGD) WITH PROPOFOL N/A 10/12/2020   Procedure: ESOPHAGOGASTRODUODENOSCOPY (EGD) WITH PROPOFOL;   Surgeon: [name], MD;  [facility];  Service: Gastroenterology;  Laterality: N/A;   FRACTURE SURGERY Left 2010   ankle   HEMOSTASIS CLIP PLACEMENT  04/11/2021   Procedure: HEMOSTASIS CLIP PLACEMENT;  Surgeon: Irving Copas., MD;  [facility];  Service: Gastroenterology;;   INSERTION PROSTATE RADIATION SEED  2007   KNEE ARTHROSCOPY Left    LAPAROSCOPY N/A 05/22/2021   Procedure: LAPAROSCOPY DIAGNOSTIC WITH WASHOUT OF ABDOMEN;  Surgeon: Leighton Ruff, MD;  [facility];  Service: General;  Laterality: N/A;   LEFT HEART CATH AND CORONARY ANGIOGRAPHY N/A 01/01/2018   Procedure: LEFT HEART CATH AND CORONARY ANGIOGRAPHY;  Surgeon: Jettie Booze, MD;  Location: [facility];  Service: Cardiovascular;  Laterality: N/A;   ORIF TIBIA & FIBULA FRACTURES Left 2003   Distal tibial/fibula    PERICARDIAL WINDOW  02/2007   PERICARDIOCENTESIS  2007   hx/notes 10/19/2011   POLYPECTOMY  10/12/2020   Procedure: POLYPECTOMY;  Surgeon: [name], MD;  [facility];  Service: Gastroenterology;;  small bowel, cecal   POLYPECTOMY  02/25/2021   Procedure: POLYPECTOMY;  Surgeon: [name], MD;  [facility];  Service: Gastroenterology;;  transverse colon x2   SUBMUCOSAL LIFTING INJECTION  04/11/2021   Procedure: SUBMUCOSAL LIFTING INJECTION;  Surgeon: Irving Copas., MD;  Location: [facility];  Service: Gastroenterology;;   SUBMUCOSAL TATTOO INJECTION  04/11/2021   Procedure: SUBMUCOSAL TATTOO INJECTION;  Surgeon: Irving Copas., MD;  Location: [facility];  Service: Gastroenterology;;    SOCIAL HISTORY:  Social History   Socioeconomic History   Marital status: Married    Spouse name: Not on file   Number of children: Not on file   Years of education: Not on file   Highest education level: Not on file  Occupational History   Not on file  Tobacco Use   Smoking status:  Former    Packs/day: 0.30    Years: 10.00    Total pack years: 3.00    Types: Cigarettes    Start date: 08/06/1958    Quit date: 1968    Years since quitting: 55.7   Smokeless tobacco: Never  Vaping Use   Vaping Use: Never used  Substance and Sexual Activity   Alcohol use: Not Currently   Drug use: Never   Sexual activity: Yes  Other Topics Concern   Not on file  Social History Narrative   Not on file   Social Determinants of Health   Financial Resource Strain: Not on file  Food Insecurity: Not on file  Transportation Needs: Not on file  Physical Activity: Not on file  Stress: Not on file  Social Connections: Not on file  Intimate Partner Violence: Not on file    FAMILY HISTORY:  Family History  Problem Relation Age of Onset   CAD Mother        MI in 16s   Prostate cancer Father    Prostate cancer Brother    Lung cancer Brother     CURRENT MEDICATIONS:  Current Outpatient Medications  Medication Sig Dispense Refill   acetaminophen (TYLENOL) 325 MG tablet Take 650 mg by mouth  every 6 (six) hours as needed (pain or fever).     apixaban (ELIQUIS) 5 MG TABS tablet Take 1 tablet (5 mg total) by mouth 2 (two) times daily. 60 tablet 6   atorvastatin (LIPITOR) 80 MG tablet TAKE 1 TABLET BY MOUTH ONCE DAILY AT  6  PM 90 tablet 2   benazepril (LOTENSIN) 40 MG tablet TAKE 1 TABLET BY MOUTH ONCE DAILY** NEEDS  OFFICE  VISIT (Patient taking differently: Take 40 mg by mouth daily.) 90 tablet 1   cyanocobalamin 1000 MCG tablet Take 1 tablet (1,000 mcg total) by mouth daily.     diltiazem (CARDIZEM CD) 180 MG 24 hr capsule Take 1 capsule (180 mg total) by mouth daily. 30 capsule 1   dorzolamide (TRUSOPT) 2 % ophthalmic solution Place 1 drop into both eyes 2 (two) times daily.     febuxostat (ULORIC) 40 MG tablet Take 1 tablet (40 mg total) by mouth daily. 30 tablet 6   FERREX 150 150 MG capsule Take 150 mg by mouth 2 (two) times daily.     hydrALAZINE (APRESOLINE) 100 MG tablet  Take 100 mg by mouth 2 (two) times daily.     latanoprost (XALATAN) 0.005 % ophthalmic solution Place 1 drop into both eyes at bedtime.     loperamide (IMODIUM) 2 MG capsule Take 2 mg by mouth as needed for diarrhea or loose stools.     metFORMIN (GLUCOPHAGE-XR) 500 MG 24 hr tablet Take 500 mg by mouth 2 (two) times daily.     metoprolol succinate (TOPROL-XL) 50 MG 24 hr tablet Take 50 mg by mouth daily. Take with or immediately following a meal.     Multiple Vitamin (MULTIVITAMIN) tablet Take 1 tablet by mouth in the morning.     nystatin cream (MYCOSTATIN) Apply topically 3 (three) times daily.     ONETOUCH ULTRA test strip USE 1 STRIP TO CHECK GLUCOSE ONCE DAILY     prochlorperazine (COMPAZINE) 10 MG tablet Take 1 tablet (10 mg total) by mouth every 6 (six) hours as needed for nausea or vomiting. 60 tablet 3   SSD 1 % cream Apply topically daily.     urea (CARMOL) 10 % cream Apply topically 2 (two) times daily. Apply topically to hands twice a day 71 g 2   vitamin C (ASCORBIC ACID) 500 MG tablet Take 500 mg by mouth 2 (two) times daily.      capecitabine (XELODA) 500 MG tablet Take 2 tablets (1,000 mg total) by mouth 2 (two) times daily after a meal. Take for 14 days, then hold for 7 days. Repeat every 21 days. 40 tablet 0   capecitabine (XELODA) 500 MG tablet Take 2 tablets (1,000 mg total) by mouth 2 (two) times daily after a meal. Take for 14 days, then hold for 7 days. Repeat every 21 days 56 tablet 1   No current facility-administered medications for this visit.    ALLERGIES:  Allergies  Allergen Reactions   Amiodarone Rash    PHYSICAL EXAM:  Performance status (ECOG): 1 - Symptomatic but completely ambulatory  Vitals:   12/01/21 1140  BP: 119/84  Pulse: (!) 56  Resp: 18  Temp: 97.9 F (36.6 C)  SpO2: 97%   Wt Readings from Last 3 Encounters:  12/01/21 210 lb (95.3 kg)  11/20/21 212 lb 8.4 oz (96.4 kg)  11/14/21 210 lb 12.8 oz (95.6 kg)   Physical Exam Vitals  reviewed.  Constitutional:      Appearance: Normal appearance.  Cardiovascular:     Rate and Rhythm: Normal rate and regular rhythm.     Pulses: Normal pulses.     Heart sounds: Normal heart sounds.  Pulmonary:     Effort: Pulmonary effort is normal.     Breath sounds: Normal breath sounds.  Skin:    Findings: Erythema (soles of feet bilaterally) present.     Comments: Skin peeling on soles of feet bilaterally  Neurological:     General: No focal deficit present.     Mental Status: He is alert and oriented to person, place, and time.  Psychiatric:        Mood and Affect: Mood normal.        Behavior: Behavior normal.      LABORATORY DATA:  I have reviewed the labs as listed.     Latest Ref Rng & Units 11/21/2021    4:44 AM 11/20/2021    4:23 AM 11/19/2021    8:45 PM  CBC  WBC 4.0 - 10.5 K/uL 7.3  10.2  10.9   Hemoglobin 13.0 - 17.0 g/dL 11.8  11.4  13.4   Hematocrit 39.0 - 52.0 % 34.8  33.4  39.9   Platelets 150 - 400 K/uL 120  109  144       Latest Ref Rng & Units 11/21/2021    4:44 AM 11/20/2021    4:23 AM 11/19/2021    8:45 PM  CMP  Glucose 70 - 99 mg/dL 141  155  230   BUN 8 - 23 mg/dL '12  20  23   '$ Creatinine 0.61 - 1.24 mg/dL 0.79  0.86  1.04   Sodium 135 - 145 mmol/L 138  136  134   Potassium 3.5 - 5.1 mmol/L 4.0  2.8  3.5   Chloride 98 - 111 mmol/L 109  106  100   CO2 22 - 32 mmol/L '23  23  24   '$ Calcium 8.9 - 10.3 mg/dL 8.7  8.6  9.3   Total Protein 6.5 - 8.1 g/dL  5.9  7.5   Total Bilirubin 0.3 - 1.2 mg/dL  0.6  0.8   Alkaline Phos 38 - 126 U/L  55  76   AST 15 - 41 U/L  21  26   ALT 0 - 44 U/L  18  22     DIAGNOSTIC IMAGING:  I have independently reviewed the scans and discussed with the patient. NM PET Image Initial (PI) Skull Base To Thigh  Result Date: 11/24/2021 CLINICAL DATA:  Initial treatment strategy for metastatic colon cancer. Surgery in April. Chemotherapy last in July. EXAM: NUCLEAR MEDICINE PET SKULL BASE TO THIGH TECHNIQUE: 10.9 mCi F-18  FDG was injected intravenously. Full-ring PET imaging was performed from the skull base to thigh after the radiotracer. CT data was obtained and used for attenuation correction and anatomic localization. Fasting blood glucose: 152 mg/dl COMPARISON:  Chest abdomen and pelvic CTs of 11/08/2021. FINDINGS: Mediastinal blood pool activity: SUV max 2.6 Liver activity: SUV max NA NECK: No areas of abnormal hypermetabolism. Incidental CT findings: Cerebral and cerebellar atrophy. No cervical adenopathy. Bilateral carotid atherosclerosis. CHEST: No pulmonary parenchymal or thoracic nodal hypermetabolism. Incidental CT findings: Mild cardiomegaly. Aortic and coronary artery calcification. Small pericardial effusion. Trace bilateral pleural effusions. The left-sided effusion is new and the right-sided effusion is either new or minimally increased. Previously described pulmonary nodules are below PET resolution. ABDOMEN/PELVIS: The areas of omental thickening are without PET correlate. Example subtle omental thickening/nodularity on 167/3,  similar. Ileocolic mesenteric adenopathy is similar but not FDG avid. Example 1.2 cm and a S.U.V. max of 1.7 on 187/3. No hepatic hypermetabolism. Incidental CT findings: Deferred to recent diagnostic CT. Normal adrenal glands. Abdominal aortic atherosclerosis. Right hemicolectomy. Large colonic stool burden. Radiation seeds in the prostate. Small bilateral fat containing inguinal hernias. SKELETON: No abnormal marrow activity. Incidental CT findings: Presumed bone islands including within the left hemisacrum and right proximal humerus. IMPRESSION: 1. The ileocolic mesenteric adenopathy is unchanged and not significantly FDG avid. This favors a benign/reactive etiology, and can be re-evaluated on follow-up CT at 3-6 months. Omental thickening and subtle nodularity are similar but not FDG avid. Recommend attention on follow-up. 2. Small pericardial effusion with development of left and  development or enlargement of right trace pleural effusions. Question fluid overload. 3. Incidental findings, including: Coronary artery atherosclerosis. Aortic Atherosclerosis (ICD10-I70.0). Electronically Signed   By: Abigail Miyamoto M.D.   On: 11/24/2021 16:35   CT Head Wo Contrast  Result Date: 11/19/2021 CLINICAL DATA:  Altered mental status, fever, possible sepsis EXAM: CT HEAD WITHOUT CONTRAST TECHNIQUE: Contiguous axial images were obtained from the base of the skull through the vertex without intravenous contrast. RADIATION DOSE REDUCTION: This exam was performed according to the departmental dose-optimization program which includes automated exposure control, adjustment of the mA and/or kV according to patient size and/or use of iterative reconstruction technique. COMPARISON:  None Available. FINDINGS: Brain: No evidence of acute infarction, hemorrhage, hydrocephalus, extra-axial collection or mass lesion/mass effect. Vascular: Intracranial atherosclerosis. Skull: Normal. Negative for fracture or focal lesion. Sinuses/Orbits: The visualized paranasal sinuses are essentially clear. The mastoid air cells are unopacified. Other: None. IMPRESSION: Normal head CT. Electronically Signed   By: Julian Hy M.D.   On: 11/19/2021 23:16   DG Chest 1 View  Result Date: 11/19/2021 CLINICAL DATA:  Fever. EXAM: CHEST  1 VIEW COMPARISON:  Chest x-ray 05/20/2021 FINDINGS: The heart size and mediastinal contours are within normal limits. Both lungs are clear. The visualized skeletal structures are unremarkable. IMPRESSION: No active disease. Electronically Signed   By: Ronney Asters M.D.   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 Status post chemotherapy. Prostate cancer with seed implants. * Tracking Code: BO * EXAM: CT CHEST, ABDOMEN, AND PELVIS WITH CONTRAST TECHNIQUE: Multidetector CT imaging of the chest, abdomen and pelvis  was performed following the standard protocol during bolus administration of intravenous contrast. RADIATION DOSE REDUCTION: This exam was performed according to the departmental dose-optimization program which includes automated exposure control, adjustment of the mA and/or kV according to patient size and/or use of iterative reconstruction technique. CONTRAST:  143m OMNIPAQUE IOHEXOL 300 MG/ML  SOLN COMPARISON:  05/21/2021 postoperative abdominopelvic CT. 03/18/2021 staging chest abdomen and pelvic CT. FINDINGS: CT CHEST FINDINGS Cardiovascular: Aortic atherosclerosis. Mild cardiomegaly with small pericardial effusion, similar. Left main and 3 vessel coronary artery calcification. No central pulmonary embolism, on this non-dedicated study. Mediastinum/Nodes: No mediastinal or hilar adenopathy. Tiny hiatal hernia. Lungs/Pleura: trace right pleural fluid. 4 mm inferior lateral right middle lobe subpleural pulmonary nodule on 100/4 is similar to the prior. A vague 3 mm right lower lobe pulmonary nodule on 73/4 is also not significantly changed. The nodule of interest on the prior exam is similar at 5 mm in the subpleural left lower lobe on 108/4. Tiny subpleural superior segment left lower lobe pulmonary nodules x2 including at up to 3 mm on  64/4 unchanged. Musculoskeletal: No acute osseous abnormality. CT ABDOMEN PELVIS FINDINGS Hepatobiliary: Small bilateral liver lesions are similar in size and distribution. Many of these are too small to characterize. The larger lesions, for example in the posterior aspect of segment 2 at 1.8 cm are consistent with cysts. No enlarging suspicious liver lesion identified. Normal gallbladder, without biliary ductal dilatation. Pancreas: Cystic lesion within the pancreatic neck measures 11 mm on 63/2 is felt to be more conspicuous than on the prior. No duct dilatation or acute inflammation. Spleen: Normal in size, without focal abnormality. Adrenals/Urinary Tract: Normal adrenal  glands. Punctate interpolar left renal collecting system calculus. Bilateral too small to characterize renal lesions. Interpolar left renal 1.2 cm lesion measures slightly greater than fluid density on 81/2 and 40/3. Not readily apparent on 03/18/2021. No hydronephrosis. Normal urinary bladder. Stomach/Bowel: Normal remainder of the stomach. Right hemicolectomy. No local recurrence. Prior enterotomy.  Otherwise normal small bowel Vascular/Lymphatic: Advanced aortic and branch vessel atherosclerosis. Ileocolic mesenteric nodes are clustered on 87/2, measuring maximally 1.0 x 1.5 cm, new since the prior CT. No pelvic sidewall adenopathy. Reproductive: Radiation seeds in the prostate. Other: Small bilateral fat containing inguinal hernias. No significant free fluid. Subtle omental thickening including on 75/2 and adjacent to the liver on 57/2. No well-defined omental nodularity. Musculoskeletal: Subcentimeter left sacral sclerotic lesion is unchanged, favoring a bone island. Mild osteopenia. IMPRESSION: CT CHEST IMPRESSION 1.  No acute process or evidence of metastatic disease in the chest. 2. Bilateral pulmonary nodules, including the left lower lobe nodule of interest, are unchanged, favored to be benign. 3. Coronary artery atherosclerosis. Aortic Atherosclerosis (ICD10-I70.0). Similar small pericardial effusion. 4. Trace right pleural fluid. CT ABDOMEN AND PELVIS IMPRESSION 1. Status post right hemicolectomy. Developing adenopathy in the ileocolic mesentery, highly suspicious for nodal metastasis. 2. Subtle interstitial thickening within the omentum could be postoperative or represent early peritoneal metastasis. Recommend attention on follow-up. 3. Developing left renal and pancreatic cystic lesions are of doubtful clinical significance, given comorbidities. Recommend attention on follow-up. 4. Left nephrolithiasis. Electronically Signed   By: Abigail Miyamoto M.D.   On: 11/09/2021 14:59     ASSESSMENT:  Colon  cancer: - Colonoscopy on 10/12/2020 with 7 sessile polyps found in the transverse colon and cecum.  12 mm polyp found in the transverse colon, sessile.  Polyp was removed with piecemeal technique using cold snare.  2 sessile polyps found in the sigmoid colon, removed with cold snare. - Pathology cecal tubular adenoma, sessile serrated polyp of the transverse colon polypectomy, adenocarcinoma arising from a tubular adenoma with high-grade dysplasia of the transverse colon polypectomy.  Sigmoid colon polypectomy showed tubular adenoma, hyperplastic polyp. - Colonoscopy on 02/25/2021 with three 3 to 5 mm polyps in the transverse colon, removed with cold snare.  One 10 mm polyp in the descending colon at 54 cm proximal to the anus. - Pathology on 02/25/2021 shows invasive adenocarcinoma, poorly differentiated, grade 3 of the descending colon polypectomy.  CDX2 positive.  Other findings include transverse colon tubular adenoma and sessile serrated adenoma. - Right hemicolectomy on 05/20/2021. - Pathology shows grade 3 adenocarcinoma, mucinous features, 1.7 cm in the transverse colon, metastatic adenocarcinoma involving 2/29 lymph nodes.  No LVI/perineural invasion.  pT1, PN 1B. - Adjuvant Xeloda 1500 mg 2 weeks on/1 week off started on 07/06/2021.  Cycle 2 on 07/27/2021, 3 tablets twice daily.  Cycle 3 on 08/26/2021, 2 tablets twice daily 2 weeks on/1 week off, dose reduced secondary to HFSR.  Cycle 4  on 09/16/2021.  Cycle 5 Xeloda 1000 mg twice daily on 10/08/2021.    Social/family history: He lives at home with his wife.  He quit smoking 23 years ago.  He smoked 1 pack/day for 7 years.  He worked in [employer] prior to retirement.  He is active and walks 2 miles per day. - Father had prostate cancer.  Brother had lung cancer.  Another brother had colon cancer (? colon), Sister with lung cancer, another sister with cancer, patient unknown.  3.  Prostate cancer: - He was diagnosed with prostate cancer in August 2007,  status post seed implants.  His PSA has been undetectable since then.   PLAN:  Stage III (T1N1B) transverse colon adenocarcinoma: - He has completed 5 cycles of Xeloda. - Last CEA was elevated at 10.1.  CT CAP showed developing adenopathy in the ileocolic mesentery with subtle interstitial thickening within the omentum. - We reviewed PET scan from 11/24/2021.  Mesenteric adenopathy is non-FDG avid.  Omental thickening is also not FDG avid.  No new sites. - He has 3 more cycles of Xeloda left. - Recommend restarting cycle 6 of Xeloda at a reduced dose of 1000 mg twice daily 2 weeks on/1 week off. - RTC 3 weeks for follow-up with repeat labs.  He will call us if he develops any hand-foot skin reaction symptoms.  2.  Normocytic anemia: - Anemia from myelosuppression.  Hemoglobin is 11.8 today.  3.  Hand-foot skin reaction: - He developed grade 2/3 hand-foot skin reaction with skin peeling, erythema and pain in the soles.       -His hands and feet are completely healed up at this time.  Continue urea cream 10% twice daily.   Orders placed this encounter:  No orders of the defined types were placed in this encounter.     Derek Jack, MD [place] [phone]

## 2021-12-06 ENCOUNTER — Other Ambulatory Visit (HOSPITAL_COMMUNITY): Payer: Self-pay

## 2021-12-06 ENCOUNTER — Other Ambulatory Visit: Payer: Self-pay | Admitting: *Deleted

## 2021-12-06 DIAGNOSIS — C184 Malignant neoplasm of transverse colon: Secondary | ICD-10-CM

## 2021-12-06 MED ORDER — CAPECITABINE 500 MG PO TABS
1000.0000 mg | ORAL_TABLET | Freq: Two times a day (BID) | ORAL | 1 refills | Status: DC
  Filled 2021-12-06: qty 56, 14d supply, fill #0

## 2021-12-06 MED ORDER — CAPECITABINE 500 MG PO TABS: 1000.0000 mg | ORAL_TABLET | Freq: Two times a day (BID) | ORAL | 0 refills | Status: DC

## 2021-12-06 NOTE — Telephone Encounter (Signed)
Patient refill Xeloda at [facility]. Rx resent to [facility]. Wilfred Curtis (patient advocate) called [facility] mail service to d/c the order sent to them.

## 2021-12-06 NOTE — Addendum Note (Signed)
Addended by: Darl Pikes on: 12/06/2021 02:40 PM   Modules accepted: Orders

## 2021-12-12 ENCOUNTER — Other Ambulatory Visit: Payer: Self-pay | Admitting: Cardiology

## 2021-12-14 ENCOUNTER — Other Ambulatory Visit (HOSPITAL_COMMUNITY): Payer: Self-pay

## 2021-12-14 ENCOUNTER — Other Ambulatory Visit: Payer: Self-pay | Admitting: *Deleted

## 2021-12-14 DIAGNOSIS — C184 Malignant neoplasm of transverse colon: Secondary | ICD-10-CM

## 2021-12-14 MED ORDER — CAPECITABINE 500 MG PO TABS: 1000.0000 mg | ORAL_TABLET | Freq: Two times a day (BID) | ORAL | 1 refills | Status: DC

## 2021-12-14 MED ORDER — CAPECITABINE 500 MG PO TABS
1000.0000 mg | ORAL_TABLET | Freq: Two times a day (BID) | ORAL | 1 refills | Status: DC
  Filled 2021-12-14 – 2021-12-20 (×2): qty 56, 14d supply, fill #0
  Filled 2022-01-09: qty 56, 14d supply, fill #1

## 2021-12-19 DIAGNOSIS — M79674 Pain in right toe(s): Secondary | ICD-10-CM | POA: Diagnosis not present

## 2021-12-19 DIAGNOSIS — M79671 Pain in right foot: Secondary | ICD-10-CM | POA: Diagnosis not present

## 2021-12-19 DIAGNOSIS — M79675 Pain in left toe(s): Secondary | ICD-10-CM | POA: Diagnosis not present

## 2021-12-19 DIAGNOSIS — E1140 Type 2 diabetes mellitus with diabetic neuropathy, unspecified: Secondary | ICD-10-CM | POA: Diagnosis not present

## 2021-12-19 DIAGNOSIS — I739 Peripheral vascular disease, unspecified: Secondary | ICD-10-CM | POA: Diagnosis not present

## 2021-12-19 DIAGNOSIS — M79672 Pain in left foot: Secondary | ICD-10-CM | POA: Diagnosis not present

## 2021-12-20 ENCOUNTER — Other Ambulatory Visit (HOSPITAL_BASED_OUTPATIENT_CLINIC_OR_DEPARTMENT_OTHER): Payer: Self-pay

## 2021-12-20 ENCOUNTER — Other Ambulatory Visit (HOSPITAL_COMMUNITY): Payer: Self-pay

## 2021-12-21 DIAGNOSIS — L03115 Cellulitis of right lower limb: Secondary | ICD-10-CM | POA: Diagnosis not present

## 2021-12-21 DIAGNOSIS — B372 Candidiasis of skin and nail: Secondary | ICD-10-CM | POA: Diagnosis not present

## 2021-12-29 ENCOUNTER — Inpatient Hospital Stay: Payer: Medicare HMO | Attending: Hematology | Admitting: Hematology

## 2021-12-29 ENCOUNTER — Inpatient Hospital Stay: Payer: Medicare HMO

## 2021-12-29 VITALS — BP 149/64 | HR 56 | Temp 97.5°F | Resp 18 | Ht 72.0 in | Wt 210.5 lb

## 2021-12-29 DIAGNOSIS — Z8042 Family history of malignant neoplasm of prostate: Secondary | ICD-10-CM | POA: Diagnosis not present

## 2021-12-29 DIAGNOSIS — C779 Secondary and unspecified malignant neoplasm of lymph node, unspecified: Secondary | ICD-10-CM | POA: Diagnosis not present

## 2021-12-29 DIAGNOSIS — Z801 Family history of malignant neoplasm of trachea, bronchus and lung: Secondary | ICD-10-CM | POA: Diagnosis not present

## 2021-12-29 DIAGNOSIS — C184 Malignant neoplasm of transverse colon: Secondary | ICD-10-CM

## 2021-12-29 DIAGNOSIS — Z87891 Personal history of nicotine dependence: Secondary | ICD-10-CM | POA: Insufficient documentation

## 2021-12-29 DIAGNOSIS — D509 Iron deficiency anemia, unspecified: Secondary | ICD-10-CM | POA: Diagnosis not present

## 2021-12-29 DIAGNOSIS — I10 Essential (primary) hypertension: Secondary | ICD-10-CM | POA: Diagnosis not present

## 2021-12-29 DIAGNOSIS — Z8546 Personal history of malignant neoplasm of prostate: Secondary | ICD-10-CM | POA: Insufficient documentation

## 2021-12-29 DIAGNOSIS — E119 Type 2 diabetes mellitus without complications: Secondary | ICD-10-CM | POA: Insufficient documentation

## 2021-12-29 DIAGNOSIS — D649 Anemia, unspecified: Secondary | ICD-10-CM | POA: Insufficient documentation

## 2021-12-29 DIAGNOSIS — Z800 Family history of malignant neoplasm of digestive organs: Secondary | ICD-10-CM | POA: Diagnosis not present

## 2021-12-29 LAB — CBC WITH DIFFERENTIAL/PLATELET
Abs Immature Granulocytes: 0.01 10*3/uL (ref 0.00–0.07)
Basophils Absolute: 0 10*3/uL (ref 0.0–0.1)
Basophils Relative: 1 %
Eosinophils Absolute: 0.1 10*3/uL (ref 0.0–0.5)
Eosinophils Relative: 1 %
HCT: 36.4 % — ABNORMAL LOW (ref 39.0–52.0)
Hemoglobin: 12.2 g/dL — ABNORMAL LOW (ref 13.0–17.0)
Immature Granulocytes: 0 %
Lymphocytes Relative: 17 %
Lymphs Abs: 0.9 10*3/uL (ref 0.7–4.0)
MCH: 33 pg (ref 26.0–34.0)
MCHC: 33.5 g/dL (ref 30.0–36.0)
MCV: 98.4 fL (ref 80.0–100.0)
Monocytes Absolute: 0.4 10*3/uL (ref 0.1–1.0)
Monocytes Relative: 9 %
Neutro Abs: 3.6 10*3/uL (ref 1.7–7.7)
Neutrophils Relative %: 72 %
Platelets: 158 10*3/uL (ref 150–400)
RBC: 3.7 MIL/uL — ABNORMAL LOW (ref 4.22–5.81)
RDW: 15.2 % (ref 11.5–15.5)
WBC: 5 10*3/uL (ref 4.0–10.5)
nRBC: 0 % (ref 0.0–0.2)

## 2021-12-29 LAB — COMPREHENSIVE METABOLIC PANEL
ALT: 22 U/L (ref 0–44)
AST: 27 U/L (ref 15–41)
Albumin: 3.8 g/dL (ref 3.5–5.0)
Alkaline Phosphatase: 79 U/L (ref 38–126)
Anion gap: 8 (ref 5–15)
BUN: 21 mg/dL (ref 8–23)
CO2: 23 mmol/L (ref 22–32)
Calcium: 9 mg/dL (ref 8.9–10.3)
Chloride: 107 mmol/L (ref 98–111)
Creatinine, Ser: 1.18 mg/dL (ref 0.61–1.24)
GFR, Estimated: >60 mL/min (ref >60)
Glucose, Bld: 203 mg/dL — ABNORMAL HIGH (ref 70–99)
Potassium: 3.7 mmol/L (ref 3.5–5.1)
Sodium: 138 mmol/L (ref 135–145)
Total Bilirubin: 0.9 mg/dL (ref 0.3–1.2)
Total Protein: 6.5 g/dL (ref 6.5–8.1)

## 2021-12-29 LAB — MAGNESIUM: Magnesium: 1.9 mg/dL (ref 1.7–2.4)

## 2021-12-29 NOTE — Progress Notes (Signed)
Patient is taking Xeloda as prescribed.  He has not missed any doses and reports no side effects at this time.   

## 2021-12-29 NOTE — Progress Notes (Signed)
[place], [place] [address]   CLINIC:  Medical Oncology/Hematology  PCP:  Caryl Bis, MD [address] [place] [address] [phone]   REASON FOR VISIT:  Follow-up for colon cancer  PRIOR THERAPY: none  NGS Results: not done  CURRENT THERAPY: surveillance  CANCER STAGING:  Cancer Staging  Colon cancer [facility]) Staging form: Colon and Rectum, AJCC 8th Edition - Clinical stage from 03/22/2021: Stage I (cT1, cN0, cM0) - Unsigned - Pathologic stage from 06/23/2021: Stage IIIA (pT1, pN1b, cM0) - Unsigned   INTERVAL HISTORY:  Mr. JET ARMBRUST, a 81 y.o. male, seen for follow-up of colon cancer.  He started cycle 6 of Xeloda 2 tablets twice daily 2 weeks on/1 week off on 12/01/2021 and completed uneventfully.  He started cycle 7 on 12/26/2021.  Denied any hand-foot skin reaction.  No GI side effects.  REVIEW OF SYSTEMS:  Review of Systems  All other systems reviewed and are negative.   PAST MEDICAL/SURGICAL HISTORY:  Past Medical History:  Diagnosis Date   Anemia    Arthritis    CAD (coronary artery disease)    DES to circumflex 12/2017   Diabetes mellitus without complication (HCC)    GERD (gastroesophageal reflux disease)    Glaucoma    Gout    Headache    History of kidney stones    Hypertension    NSTEMI (non-ST elevated myocardial infarction) ([place]) 12/31/2017   Paroxysmal atrial fibrillation (HCC)    Pericardial effusion    Idiopathic, recurrent pericardial effusion s/p pericardial window.   Prostate cancer ([place]) 2007   S/P Seed implants    Supraventricular tachycardia ([place])    Temporal arteritis ([place])    Past Surgical History:  Procedure Laterality Date   BIOPSY  10/12/2020   Procedure: BIOPSY;  Surgeon: [name], MD;  [facility];  Service: Gastroenterology;;   BIOPSY  02/25/2021   Procedure: BIOPSY;  Surgeon: [name], MD;  [facility];  Service:  Gastroenterology;;   BIOPSY  04/11/2021   Procedure: BIOPSY;  Surgeon: Irving Copas., MD;  Location: [facility];  Service: Gastroenterology;;   CATARACT EXTRACTION W/PHACO Left 06/07/2015   Procedure: CATARACT EXTRACTION PHACO AND INTRAOCULAR LENS PLACEMENT LEFT EYE CDE=8.00;  Surgeon: Tonny Branch, MD;  Location: [facility];  Service: Ophthalmology;  Laterality: Left;   CATARACT EXTRACTION W/PHACO Right 06/17/2015   Procedure: CATARACT EXTRACTION PHACO AND INTRAOCULAR LENS PLACEMENT RIGHT EYE CDE=11.09;  Surgeon: Tonny Branch, MD;  Location: [facility];  Service: Ophthalmology;  Laterality: Right;   COLONOSCOPY WITH PROPOFOL N/A 10/12/2020   Procedure: COLONOSCOPY WITH PROPOFOL;  Surgeon: [name], MD;  [facility];  Service: Gastroenterology;  Laterality: N/A;  10:35   COLONOSCOPY WITH PROPOFOL N/A 02/25/2021   Procedure: COLONOSCOPY WITH PROPOFOL;  Surgeon: [name], MD;  [facility];  Service: Gastroenterology;  Laterality: N/A;  8:10   CORONARY ANGIOPLASTY WITH STENT PLACEMENT  01/01/2018   CORONARY STENT INTERVENTION N/A 01/01/2018   Procedure: CORONARY STENT INTERVENTION;  Surgeon: Jettie Booze, MD;  Location: [facility];  Service: Cardiovascular;  Laterality: N/A;   ENDOSCOPIC MUCOSAL RESECTION N/A 04/11/2021   Procedure: ENDOSCOPIC MUCOSAL RESECTION;  Surgeon: Rush Landmark Telford Nab., MD;  [facility];  Service: Gastroenterology;  Laterality: N/A;   ENTEROSCOPY N/A 10/12/2020   Procedure: PUSH ENTEROSCOPY;  Surgeon: [name], MD;  [facility];  Service: Gastroenterology;  Laterality: N/A;   ENTEROSCOPY N/A 04/11/2021   Procedure: ENTEROSCOPY;  Surgeon: Rush Landmark Telford Nab., MD;  Location: [facility];  Service: Gastroenterology;  Laterality: N/A;   ESOPHAGOGASTRODUODENOSCOPY (EGD) WITH PROPOFOL N/A 10/12/2020   Procedure: ESOPHAGOGASTRODUODENOSCOPY (EGD) WITH PROPOFOL;   Surgeon: [name], MD;  [facility];  Service: Gastroenterology;  Laterality: N/A;   FRACTURE SURGERY Left 2010   ankle   HEMOSTASIS CLIP PLACEMENT  04/11/2021   Procedure: HEMOSTASIS CLIP PLACEMENT;  Surgeon: Irving Copas., MD;  [facility];  Service: Gastroenterology;;   INSERTION PROSTATE RADIATION SEED  2007   KNEE ARTHROSCOPY Left    LAPAROSCOPY N/A 05/22/2021   Procedure: LAPAROSCOPY DIAGNOSTIC WITH WASHOUT OF ABDOMEN;  Surgeon: Leighton Ruff, MD;  [facility];  Service: General;  Laterality: N/A;   LEFT HEART CATH AND CORONARY ANGIOGRAPHY N/A 01/01/2018   Procedure: LEFT HEART CATH AND CORONARY ANGIOGRAPHY;  Surgeon: Jettie Booze, MD;  Location: [facility];  Service: Cardiovascular;  Laterality: N/A;   ORIF TIBIA & FIBULA FRACTURES Left 2003   Distal tibial/fibula    PERICARDIAL WINDOW  02/2007   PERICARDIOCENTESIS  2007   hx/notes 10/19/2011   POLYPECTOMY  10/12/2020   Procedure: POLYPECTOMY;  Surgeon: [name], MD;  [facility];  Service: Gastroenterology;;  small bowel, cecal   POLYPECTOMY  02/25/2021   Procedure: POLYPECTOMY;  Surgeon: [name], MD;  [facility];  Service: Gastroenterology;;  transverse colon x2   SUBMUCOSAL LIFTING INJECTION  04/11/2021   Procedure: SUBMUCOSAL LIFTING INJECTION;  Surgeon: Irving Copas., MD;  Location: [facility];  Service: Gastroenterology;;   SUBMUCOSAL TATTOO INJECTION  04/11/2021   Procedure: SUBMUCOSAL TATTOO INJECTION;  Surgeon: Irving Copas., MD;  Location: [facility];  Service: Gastroenterology;;    SOCIAL HISTORY:  Social History   Socioeconomic History   Marital status: Married    Spouse name: Not on file   Number of children: Not on file   Years of education: Not on file   Highest education level: Not on file  Occupational History   Not on file  Tobacco Use   Smoking status:  Former    Packs/day: 0.30    Years: 10.00    Total pack years: 3.00    Types: Cigarettes    Start date: 08/06/1958    Quit date: 1968    Years since quitting: 55.7   Smokeless tobacco: Never  Vaping Use   Vaping Use: Never used  Substance and Sexual Activity   Alcohol use: Not Currently   Drug use: Never   Sexual activity: Yes  Other Topics Concern   Not on file  Social History Narrative   Not on file   Social Determinants of Health   Financial Resource Strain: Not on file  Food Insecurity: Not on file  Transportation Needs: Not on file  Physical Activity: Not on file  Stress: Not on file  Social Connections: Not on file  Intimate Partner Violence: Not on file    FAMILY HISTORY:  Family History  Problem Relation Age of Onset   CAD Mother        MI in 68s   Prostate cancer Father    Prostate cancer Brother    Lung cancer Brother     CURRENT MEDICATIONS:  Current Outpatient Medications  Medication Sig Dispense Refill   acetaminophen (TYLENOL) 325 MG tablet Take 650 mg by mouth as needed (pain or fever).  apixaban (ELIQUIS) 5 MG TABS tablet Take 1 tablet (5 mg total) by mouth 2 (two) times daily. 60 tablet 6   atorvastatin (LIPITOR) 80 MG tablet TAKE 1 TABLET BY MOUTH ONCE DAILY AT  6  PM 90 tablet 2   benazepril (LOTENSIN) 40 MG tablet TAKE 1 TABLET BY MOUTH ONCE DAILY *NEEDS  OFFICE  VISIT* 90 tablet 2   capecitabine (XELODA) 500 MG tablet Take 2 tablets (1,000 mg total) by mouth 2 (two) times daily after a meal. Take for 14 days, then hold for 7 days. Repeat every 21 days 56 tablet 1   cyanocobalamin 1000 MCG tablet Take 1 tablet (1,000 mcg total) by mouth daily.     diltiazem (CARDIZEM CD) 180 MG 24 hr capsule Take 1 capsule (180 mg total) by mouth daily. 30 capsule 1   dorzolamide (TRUSOPT) 2 % ophthalmic solution Place 1 drop into both eyes 2 (two) times daily.     febuxostat (ULORIC) 40 MG tablet Take 1 tablet (40 mg total) by mouth daily. 30 tablet 6    FERREX 150 150 MG capsule Take 150 mg by mouth 2 (two) times daily.     hydrALAZINE (APRESOLINE) 100 MG tablet Take 100 mg by mouth 2 (two) times daily.     latanoprost (XALATAN) 0.005 % ophthalmic solution Place 1 drop into both eyes at bedtime.     loperamide (IMODIUM) 2 MG capsule Take 2 mg by mouth as needed for diarrhea or loose stools.     metFORMIN (GLUCOPHAGE-XR) 500 MG 24 hr tablet Take 500 mg by mouth 2 (two) times daily.     metoprolol succinate (TOPROL-XL) 50 MG 24 hr tablet Take 50 mg by mouth daily. Take with or immediately following a meal.     Multiple Vitamin (MULTIVITAMIN) tablet Take 1 tablet by mouth in the morning.     nystatin cream (MYCOSTATIN) Apply topically 3 (three) times daily.     ONETOUCH ULTRA test strip USE 1 STRIP TO CHECK GLUCOSE ONCE DAILY     prochlorperazine (COMPAZINE) 10 MG tablet Take 1 tablet (10 mg total) by mouth every 6 (six) hours as needed for nausea or vomiting. (Patient taking differently: Take 10 mg by mouth as needed for nausea or vomiting.) 60 tablet 3   SSD 1 % cream Apply topically as needed.     urea (CARMOL) 10 % cream Apply topically 2 (two) times daily. Apply topically to hands twice a day 71 g 2   vitamin C (ASCORBIC ACID) 500 MG tablet Take 500 mg by mouth 2 (two) times daily.      No current facility-administered medications for this visit.    ALLERGIES:  Allergies  Allergen Reactions   Amiodarone Rash    PHYSICAL EXAM:  Performance status (ECOG): 1 - Symptomatic but completely ambulatory  Vitals:   12/29/21 1041  BP: (!) 149/64  Pulse: (!) 56  Resp: 18  Temp: (!) 97.5 F (36.4 C)  SpO2: 98%   Wt Readings from Last 3 Encounters:  12/29/21 210 lb 8.6 oz (95.5 kg)  12/01/21 210 lb (95.3 kg)  11/20/21 212 lb 8.4 oz (96.4 kg)   Physical Exam Vitals reviewed.  Constitutional:      Appearance: Normal appearance.  Cardiovascular:     Rate and Rhythm: Normal rate and regular rhythm.     Pulses: Normal pulses.     Heart  sounds: Normal heart sounds.  Pulmonary:     Effort: Pulmonary effort is normal.  Breath sounds: Normal breath sounds.  Skin:    Findings: Erythema (soles of feet bilaterally) present.     Comments: Skin peeling on soles of feet bilaterally  Neurological:     General: No focal deficit present.     Mental Status: He is alert and oriented to person, place, and time.  Psychiatric:        Mood and Affect: Mood normal.        Behavior: Behavior normal.      LABORATORY DATA:  I have reviewed the labs as listed.     Latest Ref Rng & Units 12/29/2021    9:25 AM 11/21/2021    4:44 AM 11/20/2021    4:23 AM  CBC  WBC 4.0 - 10.5 K/uL 5.0  7.3  10.2   Hemoglobin 13.0 - 17.0 g/dL 12.2  11.8  11.4   Hematocrit 39.0 - 52.0 % 36.4  34.8  33.4   Platelets 150 - 400 K/uL 158  120  109       Latest Ref Rng & Units 12/29/2021    9:25 AM 11/21/2021    4:44 AM 11/20/2021    4:23 AM  CMP  Glucose 70 - 99 mg/dL 203  141  155   BUN 8 - 23 mg/dL '21  12  20   '$ Creatinine 0.61 - 1.24 mg/dL 1.18  0.79  0.86   Sodium 135 - 145 mmol/L 138  138  136   Potassium 3.5 - 5.1 mmol/L 3.7  4.0  2.8   Chloride 98 - 111 mmol/L 107  109  106   CO2 22 - 32 mmol/L '23  23  23   '$ Calcium 8.9 - 10.3 mg/dL 9.0  8.7  8.6   Total Protein 6.5 - 8.1 g/dL 6.5   5.9   Total Bilirubin 0.3 - 1.2 mg/dL 0.9   0.6   Alkaline Phos 38 - 126 U/L 79   55   AST 15 - 41 U/L 27   21   ALT 0 - 44 U/L 22   18     DIAGNOSTIC IMAGING:  I have independently reviewed the scans and discussed with the patient. No results found.   ASSESSMENT:  Colon cancer: - Colonoscopy on 10/12/2020 with 7 sessile polyps found in the transverse colon and cecum.  12 mm polyp found in the transverse colon, sessile.  Polyp was removed with piecemeal technique using cold snare.  2 sessile polyps found in the sigmoid colon, removed with cold snare. - Pathology cecal tubular adenoma, sessile serrated polyp of the transverse colon polypectomy, adenocarcinoma  arising from a tubular adenoma with high-grade dysplasia of the transverse colon polypectomy.  Sigmoid colon polypectomy showed tubular adenoma, hyperplastic polyp. - Colonoscopy on 02/25/2021 with three 3 to 5 mm polyps in the transverse colon, removed with cold snare.  One 10 mm polyp in the descending colon at 54 cm proximal to the anus. - Pathology on 02/25/2021 shows invasive adenocarcinoma, poorly differentiated, grade 3 of the descending colon polypectomy.  CDX2 positive.  Other findings include transverse colon tubular adenoma and sessile serrated adenoma. - Right hemicolectomy on 05/20/2021. - Pathology shows grade 3 adenocarcinoma, mucinous features, 1.7 cm in the transverse colon, metastatic adenocarcinoma involving 2/29 lymph nodes.  No LVI/perineural invasion.  pT1, PN 1B. - Adjuvant Xeloda 1500 mg 2 weeks on/1 week off started on 07/06/2021.  Cycle 2 on 07/27/2021, 3 tablets twice daily.  Cycle 3 on 08/26/2021, 2 tablets twice daily 2 weeks on/1  week off, dose reduced secondary to HFSR.  Cycle 4 on 09/16/2021.  Cycle 5 Xeloda 1000 mg twice daily on 10/08/2021.    Social/family history: He lives at home with his wife.  He quit smoking 23 years ago.  He smoked 1 pack/day for 7 years.  He worked in [place] prior to retirement.  He is active and walks 2 miles per day. - Father had prostate cancer.  Brother had lung cancer.  Another brother had colon cancer (? colon), Sister with lung cancer, another sister with cancer, patient unknown.  3.  Prostate cancer: - He was diagnosed with prostate cancer in August 2007, status post seed implants.  His PSA has been undetectable since then.   PLAN:  Stage III (T1N1B) transverse colon adenocarcinoma: -PET scan (11/24/2021): Mesenteric adenopathy is non-FDG avid.  Omental thickening is also not FDG avid.  No new sites. - Last CEA was elevated at 10.1.  CEA level from today is pending. - Cycle 6 was started on 12/01/2021 at reduced dose of 2 tablets twice  daily, 2 weeks on 1 week off.  He tolerated well. - Cycle 7 was started on 12/26/2021. - Physical exam today did not show any mucositis or hand-foot skin reaction. -Labs today shows normal LFTs and CBC. - Continue with cycle 7.  Start cycle 8 on 01/16/2022. - RTC on 02/06/2022 with labs and CEA.  2.  Normocytic anemia: - Anemia from myelosuppression.  Hemoglobin is 12.2 today.  Plan to check ferritin and iron panel at next visit.  3.  Hand-foot skin reaction: - He has developed grade 2/3 HFSR with Xeloda 3 tablets twice daily.  He did not have any problems with hand-foot skin reaction when we dose reduced.   Orders placed this encounter:  Orders Placed This Encounter  Procedures   CBC with Differential   Comprehensive metabolic panel   Magnesium   CEA   Iron and TIBC ([facility] ONLY)   Ferritin       Derek Jack, MD [place] [phone]

## 2021-12-29 NOTE — Patient Instructions (Signed)
[place] at [facility] Discharge Instructions   You were seen and examined today by Dr. Delton Coombes.  He reviewed the results of your lab work which are normal/stable.   Continue Xeloda 1000 mg twice a day.  Return as scheduled.    Thank you for choosing [place] at [facility] to provide your oncology and hematology care.  To afford each patient quality time with our provider, please arrive at least 15 minutes before your scheduled appointment time.   If you have a lab appointment with the [facility] please come in thru the Main Entrance and check in at the main information desk.  You need to re-schedule your appointment should you arrive 10 or more minutes late.  We strive to give you quality time with our providers, and arriving late affects you and other patients whose appointments are after yours.  Also, if you no show three or more times for appointments you may be dismissed from the clinic at the providers discretion.     Again, thank you for choosing [facility].  Our hope is that these requests will decrease the amount of time that you wait before being seen by our physicians.       _____________________________________________________________  Should you have questions after your visit to [facility], please contact our office at [phone] and follow the prompts.  Our office hours are 8:00 a.m. and 4:30 p.m. Monday - Friday.  Please note that voicemails left after 4:00 p.m. may not be returned until the following business day.  We are closed weekends and major holidays.  You do have access to a nurse 24-7, just call the main number to the clinic [phone] and do not press any options, hold on the line and a nurse will answer the phone.    For prescription refill requests, have your pharmacy contact our office and allow 72 hours.    Due to Covid, you will need to wear a mask upon entering the  hospital. If you do not have a mask, a mask will be given to you at the Main Entrance upon arrival. For doctor visits, patients may have 1 support person age 5 or older with them. For treatment visits, patients can not have anyone with them due to social distancing guidelines and our immunocompromised population.

## 2021-12-31 LAB — CEA: CEA: 7.5 ng/mL — ABNORMAL HIGH (ref 0.0–4.7)

## 2022-01-05 DIAGNOSIS — C61 Malignant neoplasm of prostate: Secondary | ICD-10-CM | POA: Diagnosis not present

## 2022-01-08 ENCOUNTER — Encounter (HOSPITAL_COMMUNITY): Payer: Self-pay | Admitting: Emergency Medicine

## 2022-01-08 ENCOUNTER — Emergency Department (HOSPITAL_COMMUNITY)
Admission: EM | Admit: 2022-01-08 | Discharge: 2022-01-08 | Disposition: A | Discharge status: Home / Self Care | Payer: Medicare HMO | Attending: Emergency Medicine | Admitting: Emergency Medicine

## 2022-01-08 ENCOUNTER — Other Ambulatory Visit: Payer: Self-pay

## 2022-01-08 DIAGNOSIS — M25461 Effusion, right knee: Secondary | ICD-10-CM | POA: Diagnosis not present

## 2022-01-08 DIAGNOSIS — M25661 Stiffness of right knee, not elsewhere classified: Secondary | ICD-10-CM | POA: Diagnosis not present

## 2022-01-08 DIAGNOSIS — Z7901 Long term (current) use of anticoagulants: Secondary | ICD-10-CM | POA: Diagnosis not present

## 2022-01-08 DIAGNOSIS — M79604 Pain in right leg: Secondary | ICD-10-CM | POA: Insufficient documentation

## 2022-01-08 NOTE — ED Notes (Signed)
Knee immobilizer applied to R knee

## 2022-01-08 NOTE — ED Triage Notes (Signed)
Pt reports right quad pain that started after waking from a nap today. Pt denies injury to the area. Pt describes the pain as stiffness in the leg.

## 2022-01-08 NOTE — Discharge Instructions (Signed)
You may have a bursitis or inflammation of the tendon above the right knee.  You also have an effusion or fluid inside the right knee.  You can wear the knee immobilizer in the daytime when you are walking to help with stability.  I strongly recommend to use a walker at all times when walking.  When you are in bed at night or resting he can take the knee immobilizer off.  You can apply ice or cold packs for 10 minutes at a time to the right knee, giving yourself a break in between.  You can take Tylenol as needed for pain.

## 2022-01-08 NOTE — ED Provider Notes (Signed)
[facility] EMERGENCY DEPARTMENT Provider Note   CSN: [id number] Arrival date & time: 01/08/22  1021     History  Chief Complaint  Patient presents with   Leg Pain    [name] [name] is a 81 y.o. male presenting to ED with pain in his right leg.  The patient reports he typically walks sometimes with a cane but does 2 miles a day.  He was watching TV today and then got up from a nap on the couch, and began having pain in his right lower quadriceps, above his knee.  He has noted some effusion and fluid near the right knee.  He denies any falls or trauma.  Denies fevers or chills.  He is on Eliquis for A-fib.  Denies history of DVT or PE or posterior calf or leg pain.  He is here with his wife at bedside.  HPI     Home Medications Prior to Admission medications   Medication Sig Start Date End Date Taking? Authorizing Provider  acetaminophen (TYLENOL) 325 MG tablet Take 650 mg by mouth as needed (pain or fever).    [provider]  apixaban (ELIQUIS) 5 MG TABS tablet Take 1 tablet (5 mg total) by mouth 2 (two) times daily. 04/13/21   Mansouraty, Telford Nab., MD  atorvastatin (LIPITOR) 80 MG tablet TAKE 1 TABLET BY MOUTH ONCE DAILY AT  6  PM 12/01/21   Satira Sark, MD  benazepril (LOTENSIN) 40 MG tablet TAKE 1 TABLET BY MOUTH ONCE DAILY *NEEDS  OFFICE  VISIT* 12/13/21   Satira Sark, MD  capecitabine (XELODA) 500 MG tablet Take 2 tablets (1,000 mg total) by mouth 2 (two) times daily after a meal. Take for 14 days, then hold for 7 days. Repeat every 21 days 12/14/21   Derek Jack, MD  cyanocobalamin 1000 MCG tablet Take 1 tablet (1,000 mcg total) by mouth daily. 11/22/21   Hosie Poisson, MD  diltiazem (CARDIZEM CD) 180 MG 24 hr capsule Take 1 capsule (180 mg total) by mouth daily. 07/31/18   Orson Eva, MD  dorzolamide (TRUSOPT) 2 % ophthalmic solution Place 1 drop into both eyes 2 (two) times daily. 03/30/21   [provider]  febuxostat (ULORIC) 40 MG tablet  Take 1 tablet (40 mg total) by mouth daily. 06/29/21   Derek Jack, MD  FERREX 150 150 MG capsule Take 150 mg by mouth 2 (two) times daily. 01/23/20   [provider]  hydrALAZINE (APRESOLINE) 100 MG tablet Take 100 mg by mouth 2 (two) times daily.    [provider]  latanoprost (XALATAN) 0.005 % ophthalmic solution Place 1 drop into both eyes at bedtime. 01/22/20   [provider]  loperamide (IMODIUM) 2 MG capsule Take 2 mg by mouth as needed for diarrhea or loose stools.    [provider]  metFORMIN (GLUCOPHAGE-XR) 500 MG 24 hr tablet Take 500 mg by mouth 2 (two) times daily. 12/06/20   [provider]  metoprolol succinate (TOPROL-XL) 50 MG 24 hr tablet Take 50 mg by mouth daily. Take with or immediately following a meal.    [provider]  Multiple Vitamin (MULTIVITAMIN) tablet Take 1 tablet by mouth in the morning.    [provider]  nystatin cream (MYCOSTATIN) Apply topically 3 (three) times daily. 11/25/21   [provider]  ONETOUCH ULTRA test strip USE 1 STRIP TO [place] DAILY 06/06/21   [provider]  prochlorperazine (COMPAZINE) 10 MG tablet Take  1 tablet (10 mg total) by mouth every 6 (six) hours as needed for nausea or vomiting. Patient taking differently: Take 10 mg by mouth as needed for nausea or vomiting. 06/23/21   Derek Jack, MD  SSD 1 % cream Apply topically as needed. 09/19/21   [provider]  urea (CARMOL) 10 % cream Apply topically 2 (two) times daily. Apply topically to hands twice a day 08/25/21   Derek Jack, MD  vitamin C (ASCORBIC ACID) 500 MG tablet Take 500 mg by mouth 2 (two) times daily.     [provider]      Allergies    Amiodarone    Review of Systems   Review of Systems  Physical Exam Updated Vital Signs BP (!) 154/48 (BP Location: Right Arm)   Pulse (!) 51   Temp 98.6 F (37 C) (Oral)   Resp 17   SpO2 95%   Physical Exam Constitutional:      General: He is not in acute distress. HENT:     Head: Normocephalic and atraumatic.  Eyes:     Conjunctiva/sclera: Conjunctivae normal.     Pupils: Pupils are equal, round, and reactive to light.  Cardiovascular:     Rate and Rhythm: Normal rate and regular rhythm.  Pulmonary:     Effort: Pulmonary effort is normal. No respiratory distress.  Musculoskeletal:     Comments: Right knee effusion, moderate, without overlying warmth or redness Patient does have difficulty with flexing the knee beyond 45 degrees due to tenseness in the knee.  He has tenderness of the suprapatellar bursa.  He is able to fully extend his knee.  He has no posterior calf tenderness  Skin:    General: Skin is warm and dry.  Neurological:     General: No focal deficit present.     Mental Status: He is alert. Mental status is at baseline.  Psychiatric:        Mood and Affect: Mood normal.        Behavior: Behavior normal.     ED Results / Procedures / Treatments   Labs (all labs ordered are listed, but only abnormal results are displayed) Labs Reviewed - No data to display  EKG None  Radiology No results found.  Procedures Procedures    Medications Ordered in ED Medications - No data to display  ED Course/ Medical Decision Making/ A&P                           Medical Decision Making  Patient is here with right knee pain.  This is likely a suprapatellar bursitis or a tendinitis.  There may also be an arthritis component with an effusion of the right knee.  I have a very low suspicion for fracture or septic joint.  He has no risk factors for either.  I do not believe x-ray imaging would be of any utility here.  I doubt a tendon rupture.  He is able to extend his leg on his own.  I also a low suspicion for DVT.  His symptoms are anterior, located along the muscle tendon and within the joint, and not posterior, he is also on Eliquis already for A-fib.  Do not  believe we need an ultrasound.  We will put him in a knee immobilizer for extra stability.  Advised that he use a walker, which she has at home, to ensure his leg does not buckle under him.  He  will follow-up with an orthopedic clinic.  He verbalized understanding        Final Clinical Impression(s) / ED Diagnoses Final diagnoses:  Effusion of bursa of right knee  Right leg pain    Rx / DC Orders ED Discharge Orders     None         Wyvonnia Dusky, MD 01/08/22 1114

## 2022-01-09 ENCOUNTER — Other Ambulatory Visit (HOSPITAL_COMMUNITY): Payer: Self-pay

## 2022-01-10 ENCOUNTER — Other Ambulatory Visit (HOSPITAL_COMMUNITY): Payer: Self-pay

## 2022-01-10 DIAGNOSIS — M898X5 Other specified disorders of bone, thigh: Secondary | ICD-10-CM | POA: Diagnosis not present

## 2022-01-10 DIAGNOSIS — M25561 Pain in right knee: Secondary | ICD-10-CM | POA: Diagnosis not present

## 2022-01-11 ENCOUNTER — Other Ambulatory Visit (HOSPITAL_COMMUNITY): Payer: Self-pay | Admitting: Orthopedic Surgery

## 2022-01-11 DIAGNOSIS — M25561 Pain in right knee: Secondary | ICD-10-CM

## 2022-01-17 ENCOUNTER — Encounter (HOSPITAL_COMMUNITY): Payer: Self-pay

## 2022-01-17 ENCOUNTER — Ambulatory Visit (HOSPITAL_COMMUNITY): Admission: RE | Admit: 2022-01-17 | Payer: Medicare HMO | Source: Ambulatory Visit

## 2022-01-17 DIAGNOSIS — M23221 Derangement of posterior horn of medial meniscus due to old tear or injury, right knee: Secondary | ICD-10-CM | POA: Diagnosis not present

## 2022-01-17 DIAGNOSIS — M7989 Other specified soft tissue disorders: Secondary | ICD-10-CM | POA: Diagnosis not present

## 2022-01-17 DIAGNOSIS — Z743 Need for continuous supervision: Secondary | ICD-10-CM | POA: Diagnosis not present

## 2022-01-17 DIAGNOSIS — I4891 Unspecified atrial fibrillation: Secondary | ICD-10-CM | POA: Diagnosis not present

## 2022-01-17 DIAGNOSIS — M25561 Pain in right knee: Secondary | ICD-10-CM | POA: Diagnosis not present

## 2022-01-17 DIAGNOSIS — M25061 Hemarthrosis, right knee: Secondary | ICD-10-CM | POA: Diagnosis not present

## 2022-01-17 DIAGNOSIS — Z955 Presence of coronary angioplasty implant and graft: Secondary | ICD-10-CM | POA: Diagnosis not present

## 2022-01-17 DIAGNOSIS — I252 Old myocardial infarction: Secondary | ICD-10-CM | POA: Diagnosis not present

## 2022-01-17 DIAGNOSIS — I10 Essential (primary) hypertension: Secondary | ICD-10-CM | POA: Diagnosis not present

## 2022-01-17 DIAGNOSIS — M109 Gout, unspecified: Secondary | ICD-10-CM | POA: Diagnosis not present

## 2022-01-17 DIAGNOSIS — R52 Pain, unspecified: Secondary | ICD-10-CM | POA: Diagnosis not present

## 2022-01-17 DIAGNOSIS — Z79899 Other long term (current) drug therapy: Secondary | ICD-10-CM | POA: Diagnosis not present

## 2022-01-17 DIAGNOSIS — S8011XA Contusion of right lower leg, initial encounter: Secondary | ICD-10-CM | POA: Diagnosis not present

## 2022-01-17 DIAGNOSIS — Z7984 Long term (current) use of oral hypoglycemic drugs: Secondary | ICD-10-CM | POA: Diagnosis not present

## 2022-01-17 DIAGNOSIS — R001 Bradycardia, unspecified: Secondary | ICD-10-CM | POA: Diagnosis not present

## 2022-01-17 DIAGNOSIS — M1711 Unilateral primary osteoarthritis, right knee: Secondary | ICD-10-CM | POA: Diagnosis not present

## 2022-01-17 DIAGNOSIS — X58XXXA Exposure to other specified factors, initial encounter: Secondary | ICD-10-CM | POA: Diagnosis not present

## 2022-01-17 DIAGNOSIS — I2510 Atherosclerotic heart disease of native coronary artery without angina pectoris: Secondary | ICD-10-CM | POA: Diagnosis not present

## 2022-01-17 DIAGNOSIS — S83241A Other tear of medial meniscus, current injury, right knee, initial encounter: Secondary | ICD-10-CM | POA: Diagnosis not present

## 2022-01-17 DIAGNOSIS — Z7901 Long term (current) use of anticoagulants: Secondary | ICD-10-CM | POA: Diagnosis not present

## 2022-01-17 DIAGNOSIS — R609 Edema, unspecified: Secondary | ICD-10-CM | POA: Diagnosis not present

## 2022-01-18 ENCOUNTER — Telehealth: Payer: Self-pay | Admitting: Cardiology

## 2022-01-18 DIAGNOSIS — Z23 Encounter for immunization: Secondary | ICD-10-CM | POA: Diagnosis not present

## 2022-01-18 DIAGNOSIS — M25061 Hemarthrosis, right knee: Secondary | ICD-10-CM | POA: Diagnosis not present

## 2022-01-18 DIAGNOSIS — Z6828 Body mass index (BMI) 28.0-28.9, adult: Secondary | ICD-10-CM | POA: Diagnosis not present

## 2022-01-18 DIAGNOSIS — R030 Elevated blood-pressure reading, without diagnosis of hypertension: Secondary | ICD-10-CM | POA: Diagnosis not present

## 2022-01-18 DIAGNOSIS — S7011XA Contusion of right thigh, initial encounter: Secondary | ICD-10-CM | POA: Diagnosis not present

## 2022-01-18 NOTE — Telephone Encounter (Signed)
Pt c/o medication issue:  1. Name of Medication:  apixaban (ELIQUIS) 5 MG TABS tablet  2. How are you currently taking this medication (dosage and times per day)?  As prescribed  3. Are you having a reaction (difficulty breathing--STAT)?   4. What is your medication issue?   Patient's wife states the patient has a hematoma in his right knee and saw PCP today. Patient's wife states PCP, Dr. [name], assumes Eliquis caused the hematoma. Dr. [name] advised patient to stop Eliquis until Saturday 10/28. Patient's wife mentions the patient did not take Eliquis at all yesterday because he was at the ED the majority of the day. He states he has been holding it ever since. She is requesting recommendations from Dr. Domenic Polite.

## 2022-01-18 NOTE — Telephone Encounter (Signed)
Spoke with wife.  Informed of Dr McDowell's recommendation to hold Eliquis just 72 hours.  He did not take Eliquis yesterday or today.  He will continue to hold tomorrow and restart on Friday 01/20/22.  Told wife to call back if hematoma gets worse.  She verbalized understanding.

## 2022-01-23 DIAGNOSIS — Z7984 Long term (current) use of oral hypoglycemic drugs: Secondary | ICD-10-CM | POA: Diagnosis not present

## 2022-01-23 DIAGNOSIS — S7011XD Contusion of right thigh, subsequent encounter: Secondary | ICD-10-CM | POA: Diagnosis not present

## 2022-01-23 DIAGNOSIS — M25061 Hemarthrosis, right knee: Secondary | ICD-10-CM | POA: Diagnosis not present

## 2022-01-30 ENCOUNTER — Inpatient Hospital Stay: Payer: Medicare HMO | Attending: Hematology

## 2022-01-30 DIAGNOSIS — D649 Anemia, unspecified: Secondary | ICD-10-CM | POA: Insufficient documentation

## 2022-01-30 DIAGNOSIS — Z9049 Acquired absence of other specified parts of digestive tract: Secondary | ICD-10-CM | POA: Diagnosis not present

## 2022-01-30 DIAGNOSIS — C184 Malignant neoplasm of transverse colon: Secondary | ICD-10-CM | POA: Insufficient documentation

## 2022-01-30 DIAGNOSIS — I4891 Unspecified atrial fibrillation: Secondary | ICD-10-CM | POA: Insufficient documentation

## 2022-01-30 DIAGNOSIS — M25461 Effusion, right knee: Secondary | ICD-10-CM | POA: Diagnosis not present

## 2022-01-30 DIAGNOSIS — M25561 Pain in right knee: Secondary | ICD-10-CM | POA: Insufficient documentation

## 2022-01-30 DIAGNOSIS — Z87891 Personal history of nicotine dependence: Secondary | ICD-10-CM | POA: Insufficient documentation

## 2022-01-30 DIAGNOSIS — Z8546 Personal history of malignant neoplasm of prostate: Secondary | ICD-10-CM | POA: Diagnosis not present

## 2022-01-30 DIAGNOSIS — I252 Old myocardial infarction: Secondary | ICD-10-CM | POA: Diagnosis not present

## 2022-01-30 DIAGNOSIS — Z7901 Long term (current) use of anticoagulants: Secondary | ICD-10-CM | POA: Diagnosis not present

## 2022-01-30 DIAGNOSIS — D509 Iron deficiency anemia, unspecified: Secondary | ICD-10-CM

## 2022-01-30 DIAGNOSIS — Z8042 Family history of malignant neoplasm of prostate: Secondary | ICD-10-CM | POA: Diagnosis not present

## 2022-01-30 DIAGNOSIS — Z801 Family history of malignant neoplasm of trachea, bronchus and lung: Secondary | ICD-10-CM | POA: Insufficient documentation

## 2022-01-30 LAB — IRON AND TIBC
Iron: 54 ug/dL (ref 45–182)
Saturation Ratios: 17 % — ABNORMAL LOW (ref 17.9–39.5)
TIBC: 314 ug/dL (ref 250–450)
UIBC: 260 ug/dL

## 2022-01-30 LAB — CBC WITH DIFFERENTIAL/PLATELET
Abs Immature Granulocytes: 0.03 10*3/uL (ref 0.00–0.07)
Basophils Absolute: 0 10*3/uL (ref 0.0–0.1)
Basophils Relative: 1 %
Eosinophils Absolute: 0.1 10*3/uL (ref 0.0–0.5)
Eosinophils Relative: 1 %
HCT: 33.8 % — ABNORMAL LOW (ref 39.0–52.0)
Hemoglobin: 11.2 g/dL — ABNORMAL LOW (ref 13.0–17.0)
Immature Granulocytes: 0 %
Lymphocytes Relative: 9 %
Lymphs Abs: 0.6 10*3/uL — ABNORMAL LOW (ref 0.7–4.0)
MCH: 33.8 pg (ref 26.0–34.0)
MCHC: 33.1 g/dL (ref 30.0–36.0)
MCV: 102.1 fL — ABNORMAL HIGH (ref 80.0–100.0)
Monocytes Absolute: 0.6 10*3/uL (ref 0.1–1.0)
Monocytes Relative: 9 %
Neutro Abs: 5.4 10*3/uL (ref 1.7–7.7)
Neutrophils Relative %: 80 %
Platelets: 205 10*3/uL (ref 150–400)
RBC: 3.31 MIL/uL — ABNORMAL LOW (ref 4.22–5.81)
RDW: 18.5 % — ABNORMAL HIGH (ref 11.5–15.5)
WBC: 6.7 10*3/uL (ref 4.0–10.5)
nRBC: 0 % (ref 0.0–0.2)

## 2022-01-30 LAB — COMPREHENSIVE METABOLIC PANEL
ALT: 19 U/L (ref 0–44)
AST: 21 U/L (ref 15–41)
Albumin: 3.8 g/dL (ref 3.5–5.0)
Alkaline Phosphatase: 80 U/L (ref 38–126)
Anion gap: 6 (ref 5–15)
BUN: 20 mg/dL (ref 8–23)
CO2: 25 mmol/L (ref 22–32)
Calcium: 9.4 mg/dL (ref 8.9–10.3)
Chloride: 104 mmol/L (ref 98–111)
Creatinine, Ser: 0.9 mg/dL (ref 0.61–1.24)
GFR, Estimated: >60 mL/min (ref >60)
Glucose, Bld: 179 mg/dL — ABNORMAL HIGH (ref 70–99)
Potassium: 4.3 mmol/L (ref 3.5–5.1)
Sodium: 135 mmol/L (ref 135–145)
Total Bilirubin: 1 mg/dL (ref 0.3–1.2)
Total Protein: 6.5 g/dL (ref 6.5–8.1)

## 2022-01-30 LAB — MAGNESIUM: Magnesium: 2 mg/dL (ref 1.7–2.4)

## 2022-01-30 LAB — FERRITIN: Ferritin: 84 ng/mL (ref 24–336)

## 2022-01-31 DIAGNOSIS — D529 Folate deficiency anemia, unspecified: Secondary | ICD-10-CM | POA: Diagnosis not present

## 2022-01-31 DIAGNOSIS — C189 Malignant neoplasm of colon, unspecified: Secondary | ICD-10-CM | POA: Diagnosis not present

## 2022-01-31 DIAGNOSIS — D649 Anemia, unspecified: Secondary | ICD-10-CM | POA: Diagnosis not present

## 2022-01-31 DIAGNOSIS — Z1329 Encounter for screening for other suspected endocrine disorder: Secondary | ICD-10-CM | POA: Diagnosis not present

## 2022-01-31 DIAGNOSIS — E1165 Type 2 diabetes mellitus with hyperglycemia: Secondary | ICD-10-CM | POA: Diagnosis not present

## 2022-01-31 DIAGNOSIS — E7849 Other hyperlipidemia: Secondary | ICD-10-CM | POA: Diagnosis not present

## 2022-01-31 DIAGNOSIS — N182 Chronic kidney disease, stage 2 (mild): Secondary | ICD-10-CM | POA: Diagnosis not present

## 2022-01-31 DIAGNOSIS — E782 Mixed hyperlipidemia: Secondary | ICD-10-CM | POA: Diagnosis not present

## 2022-01-31 DIAGNOSIS — K219 Gastro-esophageal reflux disease without esophagitis: Secondary | ICD-10-CM | POA: Diagnosis not present

## 2022-01-31 DIAGNOSIS — D519 Vitamin B12 deficiency anemia, unspecified: Secondary | ICD-10-CM | POA: Diagnosis not present

## 2022-01-31 DIAGNOSIS — E1122 Type 2 diabetes mellitus with diabetic chronic kidney disease: Secondary | ICD-10-CM | POA: Diagnosis not present

## 2022-01-31 LAB — CEA: CEA: 12.2 ng/mL — ABNORMAL HIGH (ref 0.0–4.7)

## 2022-02-06 ENCOUNTER — Inpatient Hospital Stay: Payer: Medicare HMO | Admitting: Hematology

## 2022-02-06 VITALS — BP 117/50 | HR 56 | Temp 98.5°F | Resp 17 | Ht 72.0 in | Wt 210.6 lb

## 2022-02-06 DIAGNOSIS — Z801 Family history of malignant neoplasm of trachea, bronchus and lung: Secondary | ICD-10-CM | POA: Diagnosis not present

## 2022-02-06 DIAGNOSIS — Z8042 Family history of malignant neoplasm of prostate: Secondary | ICD-10-CM | POA: Diagnosis not present

## 2022-02-06 DIAGNOSIS — Z8546 Personal history of malignant neoplasm of prostate: Secondary | ICD-10-CM | POA: Diagnosis not present

## 2022-02-06 DIAGNOSIS — M25561 Pain in right knee: Secondary | ICD-10-CM | POA: Diagnosis not present

## 2022-02-06 DIAGNOSIS — C184 Malignant neoplasm of transverse colon: Secondary | ICD-10-CM | POA: Diagnosis not present

## 2022-02-06 DIAGNOSIS — I252 Old myocardial infarction: Secondary | ICD-10-CM | POA: Diagnosis not present

## 2022-02-06 DIAGNOSIS — Z9049 Acquired absence of other specified parts of digestive tract: Secondary | ICD-10-CM | POA: Diagnosis not present

## 2022-02-06 DIAGNOSIS — M25461 Effusion, right knee: Secondary | ICD-10-CM | POA: Diagnosis not present

## 2022-02-06 DIAGNOSIS — Z87891 Personal history of nicotine dependence: Secondary | ICD-10-CM | POA: Diagnosis not present

## 2022-02-06 DIAGNOSIS — I4891 Unspecified atrial fibrillation: Secondary | ICD-10-CM | POA: Diagnosis not present

## 2022-02-06 DIAGNOSIS — Z7901 Long term (current) use of anticoagulants: Secondary | ICD-10-CM | POA: Diagnosis not present

## 2022-02-06 DIAGNOSIS — D649 Anemia, unspecified: Secondary | ICD-10-CM | POA: Diagnosis not present

## 2022-02-06 NOTE — Progress Notes (Signed)
[name] [name], [name] [id number]   CLINIC:  Medical Oncology/Hematology  PCP:  Caryl Bis, MD [address] [place] [address] [phone]   REASON FOR VISIT:  Follow-up for colon cancer  PRIOR THERAPY: none  NGS Results: not done  CURRENT THERAPY: surveillance  CANCER STAGING:  Cancer Staging  Colon cancer [facility]) Staging form: Colon and Rectum, AJCC 8th Edition - Clinical stage from 03/22/2021: Stage I (cT1, cN0, cM0) - Unsigned - Pathologic stage from 06/23/2021: Stage IIIA (pT1, pN1b, cM0) - Unsigned   INTERVAL HISTORY:  [name] [name], a 81 y.o. male, seen for follow-up of colon cancer.  He reported pain in the right knee with swelling and was evaluated in the ER at [facility] on 01/17/2022.  He was reportedly found to have a hematoma.  He has completed last dose of Xeloda on 01/30/2022.  Did not report any mucositis or hand-foot skin reaction.  REVIEW OF SYSTEMS:  Review of Systems  Gastrointestinal:  Positive for constipation and nausea.  All other systems reviewed and are negative.   PAST MEDICAL/SURGICAL HISTORY:  Past Medical History:  Diagnosis Date   Anemia    Arthritis    CAD (coronary artery disease)    DES to circumflex 12/2017   Diabetes mellitus without complication (HCC)    GERD (gastroesophageal reflux disease)    Glaucoma    Gout    Headache    History of kidney stones    Hypertension    NSTEMI (non-ST elevated myocardial infarction) ([place]) 12/31/2017   Paroxysmal atrial fibrillation (HCC)    Pericardial effusion    Idiopathic, recurrent pericardial effusion s/p pericardial window.   Prostate cancer ([place]) 2007   S/P Seed implants    Supraventricular tachycardia    Temporal arteritis [facility])    Past Surgical History:  Procedure Laterality Date   BIOPSY  10/12/2020   Procedure: BIOPSY;  Surgeon: [name], MD;  [facility];  Service: Gastroenterology;;   BIOPSY   02/25/2021   Procedure: BIOPSY;  Surgeon: [name], MD;  [facility];  Service: Gastroenterology;;   BIOPSY  04/11/2021   Procedure: BIOPSY;  Surgeon: Irving Copas., MD;  Location: [facility];  Service: Gastroenterology;;   CATARACT EXTRACTION W/PHACO Left 06/07/2015   Procedure: CATARACT EXTRACTION PHACO AND INTRAOCULAR LENS PLACEMENT LEFT EYE CDE=8.00;  Surgeon: Tonny Branch, MD;  Location: [facility];  Service: Ophthalmology;  Laterality: Left;   CATARACT EXTRACTION W/PHACO Right 06/17/2015   Procedure: CATARACT EXTRACTION PHACO AND INTRAOCULAR LENS PLACEMENT RIGHT EYE CDE=11.09;  Surgeon: Tonny Branch, MD;  Location: [facility];  Service: Ophthalmology;  Laterality: Right;   COLONOSCOPY WITH PROPOFOL N/A 10/12/2020   Procedure: COLONOSCOPY WITH PROPOFOL;  Surgeon: [name], MD;  [facility];  Service: Gastroenterology;  Laterality: N/A;  10:35   COLONOSCOPY WITH PROPOFOL N/A 02/25/2021   Procedure: COLONOSCOPY WITH PROPOFOL;  Surgeon: [name], MD;  [facility];  Service: Gastroenterology;  Laterality: N/A;  8:10   CORONARY ANGIOPLASTY WITH STENT PLACEMENT  01/01/2018   CORONARY STENT INTERVENTION N/A 01/01/2018   Procedure: CORONARY STENT INTERVENTION;  Surgeon: Jettie Booze, MD;  Location: [place] CV LAB;  Service: Cardiovascular;  Laterality: N/A;   ENDOSCOPIC MUCOSAL RESECTION N/A 04/11/2021   Procedure: ENDOSCOPIC MUCOSAL RESECTION;  Surgeon: Rush Landmark Telford Nab., MD;  [facility];  Service: Gastroenterology;  Laterality: N/A;   ENTEROSCOPY  N/A 10/12/2020   Procedure: PUSH ENTEROSCOPY;  Surgeon: [name], MD;  [facility];  Service: Gastroenterology;  Laterality: N/A;   ENTEROSCOPY N/A 04/11/2021   Procedure: ENTEROSCOPY;  Surgeon: Rush Landmark Telford Nab., MD;  Location: [facility];  Service: Gastroenterology;  Laterality: N/A;    ESOPHAGOGASTRODUODENOSCOPY (EGD) WITH PROPOFOL N/A 10/12/2020   Procedure: ESOPHAGOGASTRODUODENOSCOPY (EGD) WITH PROPOFOL;  Surgeon: [name], MD;  [facility];  Service: Gastroenterology;  Laterality: N/A;   FRACTURE SURGERY Left 2010   ankle   HEMOSTASIS CLIP PLACEMENT  04/11/2021   Procedure: HEMOSTASIS CLIP PLACEMENT;  Surgeon: Irving Copas., MD;  [facility];  Service: Gastroenterology;;   INSERTION PROSTATE RADIATION SEED  2007   KNEE ARTHROSCOPY Left    LAPAROSCOPY N/A 05/22/2021   Procedure: LAPAROSCOPY DIAGNOSTIC WITH WASHOUT OF ABDOMEN;  Surgeon: Leighton Ruff, MD;  [facility];  Service: General;  Laterality: N/A;   LEFT HEART CATH AND CORONARY ANGIOGRAPHY N/A 01/01/2018   Procedure: LEFT HEART CATH AND CORONARY ANGIOGRAPHY;  Surgeon: Jettie Booze, MD;  Location: [place] [facility];  Service: Cardiovascular;  Laterality: N/A;   ORIF TIBIA & FIBULA FRACTURES Left 2003   Distal tibial/fibula    PERICARDIAL WINDOW  02/2007   PERICARDIOCENTESIS  2007   hx/notes 10/19/2011   POLYPECTOMY  10/12/2020   Procedure: POLYPECTOMY;  Surgeon: [name], MD;  [facility];  Service: Gastroenterology;;  small bowel, cecal   POLYPECTOMY  02/25/2021   Procedure: POLYPECTOMY;  Surgeon: [name], MD;  [facility];  Service: Gastroenterology;;  transverse colon x2   SUBMUCOSAL LIFTING INJECTION  04/11/2021   Procedure: SUBMUCOSAL LIFTING INJECTION;  Surgeon: Irving Copas., MD;  Location: [facility];  Service: Gastroenterology;;   SUBMUCOSAL TATTOO INJECTION  04/11/2021   Procedure: SUBMUCOSAL TATTOO INJECTION;  Surgeon: Irving Copas., MD;  Location: [facility];  Service: Gastroenterology;;    SOCIAL HISTORY:  Social History   Socioeconomic History   Marital status: Married    Spouse name: Not on file   Number of children: Not on file   Years of education: Not  on file   Highest education level: Not on file  Occupational History   Not on file  Tobacco Use   Smoking status: Former    Packs/day: 0.30    Years: 10.00    Total pack years: 3.00    Types: Cigarettes    Start date: 08/06/1958    Quit date: 1968    Years since quitting: 55.9   Smokeless tobacco: Never  Vaping Use   Vaping Use: Never used  Substance and Sexual Activity   Alcohol use: Not Currently   Drug use: Never   Sexual activity: Yes  Other Topics Concern   Not on file  Social History Narrative   Not on file   Social Determinants of Health   Financial Resource Strain: Not on file  Food Insecurity: Not on file  Transportation Needs: Not on file  Physical Activity: Not on file  Stress: Not on file  Social Connections: Not on file  Intimate Partner Violence: Not on file    FAMILY HISTORY:  Family History  Problem Relation Age of Onset   CAD Mother        MI in 89s   Prostate cancer Father    Prostate cancer Brother    Lung cancer Brother     CURRENT MEDICATIONS:  Current Outpatient Medications  Medication Sig  Dispense Refill   acetaminophen (TYLENOL) 325 MG tablet Take 650 mg by mouth as needed (pain or fever).     apixaban (ELIQUIS) 5 MG TABS tablet Take 1 tablet (5 mg total) by mouth 2 (two) times daily. 60 tablet 6   atorvastatin (LIPITOR) 80 MG tablet TAKE 1 TABLET BY MOUTH ONCE DAILY AT  6  PM 90 tablet 2   benazepril (LOTENSIN) 40 MG tablet TAKE 1 TABLET BY MOUTH ONCE DAILY *NEEDS  OFFICE  VISIT* 90 tablet 2   capecitabine (XELODA) 500 MG tablet Take 2 tablets (1,000 mg total) by mouth 2 (two) times daily after a meal. Take for 14 days, then hold for 7 days. Repeat every 21 days 56 tablet 1   cyanocobalamin 1000 MCG tablet Take 1 tablet (1,000 mcg total) by mouth daily.     diltiazem (CARDIZEM CD) 180 MG 24 hr capsule Take 1 capsule (180 mg total) by mouth daily. 30 capsule 1   dorzolamide (TRUSOPT) 2 % ophthalmic solution Place 1 drop into both eyes 2  (two) times daily.     febuxostat (ULORIC) 40 MG tablet Take 1 tablet (40 mg total) by mouth daily. 30 tablet 6   FERREX 150 150 MG capsule Take 150 mg by mouth 2 (two) times daily.     hydrALAZINE (APRESOLINE) 100 MG tablet Take 100 mg by mouth 2 (two) times daily.     latanoprost (XALATAN) 0.005 % ophthalmic solution Place 1 drop into both eyes at bedtime.     loperamide (IMODIUM) 2 MG capsule Take 2 mg by mouth as needed for diarrhea or loose stools.     metFORMIN (GLUCOPHAGE-XR) 500 MG 24 hr tablet Take 500 mg by mouth 2 (two) times daily.     metoprolol succinate (TOPROL-XL) 50 MG 24 hr tablet Take 50 mg by mouth daily. Take with or immediately following a meal.     Multiple Vitamin (MULTIVITAMIN) tablet Take 1 tablet by mouth in the morning.     nystatin cream (MYCOSTATIN) Apply topically 3 (three) times daily.     ONETOUCH ULTRA test strip USE 1 STRIP TO CHECK GLUCOSE ONCE DAILY     prochlorperazine (COMPAZINE) 10 MG tablet Take 1 tablet (10 mg total) by mouth every 6 (six) hours as needed for nausea or vomiting. (Patient taking differently: Take 10 mg by mouth as needed for nausea or vomiting.) 60 tablet 3   SSD 1 % cream Apply topically as needed.     traMADol (ULTRAM) 50 MG tablet Take 50 mg by mouth every 8 (eight) hours as needed.     urea (CARMOL) 10 % cream Apply topically 2 (two) times daily. Apply topically to hands twice a day 71 g 2   vitamin C (ASCORBIC ACID) 500 MG tablet Take 500 mg by mouth 2 (two) times daily.      No current facility-administered medications for this visit.    ALLERGIES:  Allergies  Allergen Reactions   Amiodarone Rash    PHYSICAL EXAM:  Performance status (ECOG): 1 - Symptomatic but completely ambulatory  Vitals:   02/06/22 1129  BP: (!) 117/50  Pulse: (!) 56  Resp: 17  Temp: 98.5 F (36.9 C)  SpO2: 99%   Wt Readings from Last 3 Encounters:  02/06/22 210 lb 9.6 oz (95.5 kg)  12/29/21 210 lb 8.6 oz (95.5 kg)  12/01/21 210 lb (95.3 kg)    Physical Exam Vitals reviewed.  Constitutional:      Appearance: Normal appearance.  Cardiovascular:  Rate and Rhythm: Normal rate and regular rhythm.     Pulses: Normal pulses.     Heart sounds: Normal heart sounds.  Pulmonary:     Effort: Pulmonary effort is normal.     Breath sounds: Normal breath sounds.  Skin:    Findings: Erythema (soles of feet bilaterally) present.     Comments: Skin peeling on soles of feet bilaterally  Neurological:     General: No focal deficit present.     Mental Status: He is alert and oriented to person, place, and time.  Psychiatric:        Mood and Affect: Mood normal.        Behavior: Behavior normal.      LABORATORY DATA:  I have reviewed the labs as listed.     Latest Ref Rng & Units 01/30/2022   11:45 AM 12/29/2021    9:25 AM 11/21/2021    4:44 AM  CBC  WBC 4.0 - 10.5 K/uL 6.7  5.0  7.3   Hemoglobin 13.0 - 17.0 g/dL 11.2  12.2  11.8   Hematocrit 39.0 - 52.0 % 33.8  36.4  34.8   Platelets 150 - 400 K/uL 205  158  120       Latest Ref Rng & Units 01/30/2022   11:45 AM 12/29/2021    9:25 AM 11/21/2021    4:44 AM  CMP  Glucose 70 - 99 mg/dL 179  203  141   BUN 8 - 23 mg/dL '20  21  12   '$ Creatinine 0.61 - 1.24 mg/dL 0.90  1.18  0.79   Sodium 135 - 145 mmol/L 135  138  138   Potassium 3.5 - 5.1 mmol/L 4.3  3.7  4.0   Chloride 98 - 111 mmol/L 104  107  109   CO2 22 - 32 mmol/L '25  23  23   '$ Calcium 8.9 - 10.3 mg/dL 9.4  9.0  8.7   Total Protein 6.5 - 8.1 g/dL 6.5  6.5    Total Bilirubin 0.3 - 1.2 mg/dL 1.0  0.9    Alkaline Phos 38 - 126 U/L 80  79    AST 15 - 41 U/L 21  27    ALT 0 - 44 U/L 19  22      DIAGNOSTIC IMAGING:  I have independently reviewed the scans and discussed with the patient. No results found.   ASSESSMENT:  Colon cancer: - Colonoscopy on 10/12/2020 with 7 sessile polyps found in the transverse colon and cecum.  12 mm polyp found in the transverse colon, sessile.  Polyp was removed with piecemeal technique  using cold snare.  2 sessile polyps found in the sigmoid colon, removed with cold snare. - Pathology cecal tubular adenoma, sessile serrated polyp of the transverse colon polypectomy, adenocarcinoma arising from a tubular adenoma with high-grade dysplasia of the transverse colon polypectomy.  Sigmoid colon polypectomy showed tubular adenoma, hyperplastic polyp. - Colonoscopy on 02/25/2021 with three 3 to 5 mm polyps in the transverse colon, removed with cold snare.  One 10 mm polyp in the descending colon at 54 cm proximal to the anus. - Pathology on 02/25/2021 shows invasive adenocarcinoma, poorly differentiated, grade 3 of the descending colon polypectomy.  CDX2 positive.  Other findings include transverse colon tubular adenoma and sessile serrated adenoma. - Right hemicolectomy on 05/20/2021. - Pathology shows grade 3 adenocarcinoma, mucinous features, 1.7 cm in the transverse colon, metastatic adenocarcinoma involving 2/29 lymph nodes.  No LVI/perineural invasion.  pT1, PN 1B. - Adjuvant Xeloda 1500 mg 2 weeks on/1 week off started on 07/06/2021.  Cycle 2 on 07/27/2021, 3 tablets twice daily.  Cycle 3 on 08/26/2021, 2 tablets twice daily 2 weeks on/1 week off, dose reduced secondary to HFSR.  Cycle 4 on 09/16/2021.  Cycle 5 Xeloda 1000 mg twice daily on 10/08/2021.  Completed cycle 8 on 01/30/2022.    Social/family history: He lives at home with his wife.  He quit smoking 23 years ago.  He smoked 1 pack/day for 7 years.  He worked in [place] prior to retirement.  He is active and walks 2 miles per day. - Father had prostate cancer.  Brother had lung cancer.  Another brother had colon cancer (? colon), Sister with lung cancer, another sister with cancer, patient unknown.  3.  Prostate cancer: - He was diagnosed with prostate cancer in August 2007, status post seed implants.  His PSA has been undetectable since then.   PLAN:  Stage III (T1N1B) transverse colon adenocarcinoma: - PET scan on 11/24/2021:  Mesenteric adenopathy is non-FDG avid.  Omental thickening is also not FDG avid.  No new sites. - Completed cycle 8 of Xeloda 2 weeks on/1 week off on 01/30/2022. - He did not have any major HFSR or mucositis. - Labs from 01/30/2022 shows normal LFTs and creatinine and electrolytes.  White count and platelet count is normal.  Hemoglobin 11.2.  CEA was elevated at 12.2, up from 7.5. - Recommend CTAP with contrast in 3 weeks.  RTC after CT scan. - We discussed surveillance plan if the CT scan is normal.  He will come to our clinic with every 3 months labs and CEA.  CT scan will be repeated every 6 months for the first 2 years.  2.  Normocytic anemia: - Anemia from myelosuppression.  Hemoglobin is 11.2 today. - If it drops below 11, will check ferritin and iron panel.  3.  Right knee swelling and pain: - He has developed a spontaneous pain and swelling in the right knee and above the right knee.  He denies any injury. - I have reviewed records from [facility] emergency room on 01/17/2022. - MRI of the knee (01/17/2022): 5.6 x 2.5 x 3.4 cm heterogeneous collection in the medial aspect of the vastus intermedius/vastus medialis. - He is on Eliquis for atrial fibrillation. - He is currently walking with a walker and is using ice packs 3 times a day. - Continue follow-up with Dr. Para March. - Continue physical therapy once a week.   Orders placed this encounter:  Orders Placed This Encounter  Procedures   CT Abdomen Pelvis W Contrast   CBC with Differential   Comprehensive metabolic panel   Magnesium   CEA       Derek Jack, MD [place] [phone]

## 2022-02-06 NOTE — Patient Instructions (Addendum)
[facility]  Discharge Instructions  You were seen and examined today by Dr. Delton Coombes.  Your labs are stable, except for your CEA being elevated at 12.2 (it was 7.5 at the last check). It is likely elevated in the presence of inflammation given your recent knee issues. Please follow-up with Dr. Noemi Chapel about your knee.  You will have a scan in 3 weeks.  Follow-up as scheduled after the CT scan.  Thank you for choosing [facility] to provide your oncology and hematology care.   To afford each patient quality time with our provider, please arrive at least 15 minutes before your scheduled appointment time. You may need to reschedule your appointment if you arrive late (10 or more minutes). Arriving late affects you and other patients whose appointments are after yours.  Also, if you miss three or more appointments without notifying the office, you may be dismissed from the clinic at the provider's discretion.    Again, thank you for choosing [facility].  Our hope is that these requests will decrease the amount of time that you wait before being seen by our physicians.   If you have a lab appointment with the [facility] please come in thru the Main Entrance and check in at the main information desk.           _____________________________________________________________  Should you have questions after your visit to [facility], please contact our office at [phone] and follow the prompts.  Our office hours are 8:00 a.m. to 4:30 p.m. Monday - Thursday and 8:00 a.m. to 2:30 p.m. Friday.  Please note that voicemails left after 4:00 p.m. may not be returned until the following business day.  We are closed weekends and all major holidays.  You do have access to a nurse 24-7, just call the main number to the clinic [phone] and do not press any options, hold on the line and a nurse will answer the phone.     For prescription refill requests, have your pharmacy contact our office and allow 72 hours.    Masks are optional in the cancer centers. If you would like for your care team to wear a mask while they are taking care of you, please let them know. You may have one support person who is at least 81 years old accompany you for your appointments.

## 2022-02-07 DIAGNOSIS — E11621 Type 2 diabetes mellitus with foot ulcer: Secondary | ICD-10-CM | POA: Diagnosis not present

## 2022-02-07 DIAGNOSIS — L97509 Non-pressure chronic ulcer of other part of unspecified foot with unspecified severity: Secondary | ICD-10-CM | POA: Diagnosis not present

## 2022-02-07 DIAGNOSIS — Z23 Encounter for immunization: Secondary | ICD-10-CM | POA: Diagnosis not present

## 2022-02-07 DIAGNOSIS — D692 Other nonthrombocytopenic purpura: Secondary | ICD-10-CM | POA: Diagnosis not present

## 2022-02-07 DIAGNOSIS — I4891 Unspecified atrial fibrillation: Secondary | ICD-10-CM | POA: Diagnosis not present

## 2022-02-07 DIAGNOSIS — E1169 Type 2 diabetes mellitus with other specified complication: Secondary | ICD-10-CM | POA: Diagnosis not present

## 2022-02-07 DIAGNOSIS — G2581 Restless legs syndrome: Secondary | ICD-10-CM | POA: Diagnosis not present

## 2022-02-07 DIAGNOSIS — C189 Malignant neoplasm of colon, unspecified: Secondary | ICD-10-CM | POA: Diagnosis not present

## 2022-02-07 DIAGNOSIS — I10 Essential (primary) hypertension: Secondary | ICD-10-CM | POA: Diagnosis not present

## 2022-02-07 DIAGNOSIS — E7849 Other hyperlipidemia: Secondary | ICD-10-CM | POA: Diagnosis not present

## 2022-02-07 DIAGNOSIS — D509 Iron deficiency anemia, unspecified: Secondary | ICD-10-CM | POA: Diagnosis not present

## 2022-02-24 DIAGNOSIS — Z7984 Long term (current) use of oral hypoglycemic drugs: Secondary | ICD-10-CM | POA: Diagnosis not present

## 2022-02-24 DIAGNOSIS — M25061 Hemarthrosis, right knee: Secondary | ICD-10-CM | POA: Diagnosis not present

## 2022-02-24 DIAGNOSIS — S7011XD Contusion of right thigh, subsequent encounter: Secondary | ICD-10-CM | POA: Diagnosis not present

## 2022-02-27 DIAGNOSIS — M79675 Pain in left toe(s): Secondary | ICD-10-CM | POA: Diagnosis not present

## 2022-02-27 DIAGNOSIS — M79671 Pain in right foot: Secondary | ICD-10-CM | POA: Diagnosis not present

## 2022-02-27 DIAGNOSIS — E1140 Type 2 diabetes mellitus with diabetic neuropathy, unspecified: Secondary | ICD-10-CM | POA: Diagnosis not present

## 2022-02-27 DIAGNOSIS — L110 Acquired keratosis follicularis: Secondary | ICD-10-CM | POA: Diagnosis not present

## 2022-02-27 DIAGNOSIS — M79674 Pain in right toe(s): Secondary | ICD-10-CM | POA: Diagnosis not present

## 2022-02-27 DIAGNOSIS — M79672 Pain in left foot: Secondary | ICD-10-CM | POA: Diagnosis not present

## 2022-02-27 DIAGNOSIS — I739 Peripheral vascular disease, unspecified: Secondary | ICD-10-CM | POA: Diagnosis not present

## 2022-03-07 ENCOUNTER — Ambulatory Visit (HOSPITAL_COMMUNITY)
Admission: RE | Admit: 2022-03-07 | Discharge: 2022-03-07 | Disposition: A | Discharge status: Home / Self Care | Payer: Medicare HMO | Source: Ambulatory Visit | Attending: Hematology | Admitting: Hematology

## 2022-03-07 ENCOUNTER — Inpatient Hospital Stay: Payer: Medicare HMO | Attending: Hematology

## 2022-03-07 DIAGNOSIS — Z87891 Personal history of nicotine dependence: Secondary | ICD-10-CM | POA: Insufficient documentation

## 2022-03-07 DIAGNOSIS — D649 Anemia, unspecified: Secondary | ICD-10-CM | POA: Diagnosis not present

## 2022-03-07 DIAGNOSIS — C189 Malignant neoplasm of colon, unspecified: Secondary | ICD-10-CM | POA: Diagnosis not present

## 2022-03-07 DIAGNOSIS — C184 Malignant neoplasm of transverse colon: Secondary | ICD-10-CM | POA: Insufficient documentation

## 2022-03-07 DIAGNOSIS — Z923 Personal history of irradiation: Secondary | ICD-10-CM | POA: Diagnosis not present

## 2022-03-07 DIAGNOSIS — K4090 Unilateral inguinal hernia, without obstruction or gangrene, not specified as recurrent: Secondary | ICD-10-CM | POA: Diagnosis not present

## 2022-03-07 DIAGNOSIS — Z8546 Personal history of malignant neoplasm of prostate: Secondary | ICD-10-CM | POA: Diagnosis not present

## 2022-03-07 DIAGNOSIS — Z79899 Other long term (current) drug therapy: Secondary | ICD-10-CM | POA: Diagnosis not present

## 2022-03-07 DIAGNOSIS — Z9221 Personal history of antineoplastic chemotherapy: Secondary | ICD-10-CM | POA: Diagnosis not present

## 2022-03-07 DIAGNOSIS — M25461 Effusion, right knee: Secondary | ICD-10-CM | POA: Insufficient documentation

## 2022-03-07 LAB — CBC WITH DIFFERENTIAL/PLATELET
Abs Immature Granulocytes: 0.09 10*3/uL — ABNORMAL HIGH (ref 0.00–0.07)
Basophils Absolute: 0.1 10*3/uL (ref 0.0–0.1)
Basophils Relative: 1 %
Eosinophils Absolute: 0.1 10*3/uL (ref 0.0–0.5)
Eosinophils Relative: 2 %
HCT: 35.2 % — ABNORMAL LOW (ref 39.0–52.0)
Hemoglobin: 11.2 g/dL — ABNORMAL LOW (ref 13.0–17.0)
Immature Granulocytes: 1 %
Lymphocytes Relative: 10 %
Lymphs Abs: 0.9 10*3/uL (ref 0.7–4.0)
MCH: 30.6 pg (ref 26.0–34.0)
MCHC: 31.8 g/dL (ref 30.0–36.0)
MCV: 96.2 fL (ref 80.0–100.0)
Monocytes Absolute: 0.9 10*3/uL (ref 0.1–1.0)
Monocytes Relative: 10 %
Neutro Abs: 6.8 10*3/uL (ref 1.7–7.7)
Neutrophils Relative %: 76 %
Platelets: 292 10*3/uL (ref 150–400)
RBC: 3.66 MIL/uL — ABNORMAL LOW (ref 4.22–5.81)
RDW: 16.8 % — ABNORMAL HIGH (ref 11.5–15.5)
WBC: 8.9 10*3/uL (ref 4.0–10.5)
nRBC: 0 % (ref 0.0–0.2)

## 2022-03-07 LAB — COMPREHENSIVE METABOLIC PANEL
ALT: 18 U/L (ref 0–44)
AST: 19 U/L (ref 15–41)
Albumin: 3.1 g/dL — ABNORMAL LOW (ref 3.5–5.0)
Alkaline Phosphatase: 71 U/L (ref 38–126)
Anion gap: 10 (ref 5–15)
BUN: 14 mg/dL (ref 8–23)
CO2: 24 mmol/L (ref 22–32)
Calcium: 9.6 mg/dL (ref 8.9–10.3)
Chloride: 103 mmol/L (ref 98–111)
Creatinine, Ser: 0.82 mg/dL (ref 0.61–1.24)
GFR, Estimated: >60 mL/min (ref >60)
Glucose, Bld: 165 mg/dL — ABNORMAL HIGH (ref 70–99)
Potassium: 4.3 mmol/L (ref 3.5–5.1)
Sodium: 137 mmol/L (ref 135–145)
Total Bilirubin: 0.2 mg/dL — ABNORMAL LOW (ref 0.3–1.2)
Total Protein: 7.2 g/dL (ref 6.5–8.1)

## 2022-03-07 LAB — MAGNESIUM: Magnesium: 1.9 mg/dL (ref 1.7–2.4)

## 2022-03-07 MED ORDER — IOHEXOL 300 MG/ML  SOLN
100.0000 mL | Freq: Once | INTRAMUSCULAR | Status: AC | PRN
  Administered 2022-03-07: 100 mL via INTRAVENOUS

## 2022-03-08 LAB — CEA: CEA: 6.2 ng/mL — ABNORMAL HIGH (ref 0.0–4.7)

## 2022-03-13 ENCOUNTER — Inpatient Hospital Stay: Payer: Medicare HMO | Admitting: Hematology

## 2022-03-13 VITALS — BP 141/60 | HR 65 | Temp 98.2°F | Resp 18 | Ht 71.0 in | Wt 207.8 lb

## 2022-03-13 DIAGNOSIS — C184 Malignant neoplasm of transverse colon: Secondary | ICD-10-CM | POA: Diagnosis not present

## 2022-03-13 DIAGNOSIS — D649 Anemia, unspecified: Secondary | ICD-10-CM | POA: Diagnosis not present

## 2022-03-13 DIAGNOSIS — D509 Iron deficiency anemia, unspecified: Secondary | ICD-10-CM | POA: Diagnosis not present

## 2022-03-13 DIAGNOSIS — M25461 Effusion, right knee: Secondary | ICD-10-CM | POA: Diagnosis not present

## 2022-03-13 DIAGNOSIS — Z87891 Personal history of nicotine dependence: Secondary | ICD-10-CM | POA: Diagnosis not present

## 2022-03-13 DIAGNOSIS — Z923 Personal history of irradiation: Secondary | ICD-10-CM | POA: Diagnosis not present

## 2022-03-13 DIAGNOSIS — Z9221 Personal history of antineoplastic chemotherapy: Secondary | ICD-10-CM | POA: Diagnosis not present

## 2022-03-13 DIAGNOSIS — Z79899 Other long term (current) drug therapy: Secondary | ICD-10-CM | POA: Diagnosis not present

## 2022-03-13 DIAGNOSIS — Z8546 Personal history of malignant neoplasm of prostate: Secondary | ICD-10-CM | POA: Diagnosis not present

## 2022-03-13 NOTE — Progress Notes (Signed)
[name] [name], [name] [name]   CLINIC:  Medical Oncology/Hematology  PCP:  [name] Bis, MD [address] [place] [address] [phone]   REASON FOR VISIT:  Follow-up for colon cancer  PRIOR THERAPY: none  NGS Results: not done  CURRENT THERAPY: surveillance  CANCER STAGING:  Cancer Staging  Colon cancer [name]) Staging form: Colon and Rectum, AJCC 8th Edition - Clinical stage from 03/22/2021: Stage I (cT1, cN0, cM0) - Unsigned - Pathologic stage from 06/23/2021: Stage IIIA (pT1, pN1b, cM0) - Unsigned   INTERVAL HISTORY:  [name], a 81 y.o. male, seen for follow-up of colon cancer.  He reports right knee swelling and pain is improving.  Energy levels are 60%.  REVIEW OF SYSTEMS:  Review of Systems  Respiratory:  Positive for cough.   Gastrointestinal:  Positive for nausea.  All other systems reviewed and are negative.   PAST MEDICAL/SURGICAL HISTORY:  Past Medical History:  Diagnosis Date   Anemia    Arthritis    CAD (coronary artery disease)    DES to circumflex 12/2017   Diabetes mellitus without complication (HCC)    GERD (gastroesophageal reflux disease)    Glaucoma    Gout    Headache    History of kidney stones    Hypertension    NSTEMI (non-ST elevated myocardial infarction) ([place]) 12/31/2017   Paroxysmal atrial fibrillation (HCC)    Pericardial effusion    Idiopathic, recurrent pericardial effusion s/p pericardial window.   Prostate cancer ([place]) 2007   S/P Seed implants    Supraventricular tachycardia    Temporal arteritis [facility])    Past Surgical History:  Procedure Laterality Date   BIOPSY  10/12/2020   Procedure: BIOPSY;  Surgeon: [name], MD;  [facility];  Service: Gastroenterology;;   BIOPSY  02/25/2021   Procedure: BIOPSY;  Surgeon: [name], MD;  [facility];  Service: Gastroenterology;;   BIOPSY  04/11/2021   Procedure: BIOPSY;   Surgeon: Irving Copas., MD;  Location: [facility];  Service: Gastroenterology;;   CATARACT EXTRACTION W/PHACO Left 06/07/2015   Procedure: CATARACT EXTRACTION PHACO AND INTRAOCULAR LENS PLACEMENT LEFT EYE CDE=8.00;  Surgeon: Tonny Branch, MD;  Location: [facility];  Service: Ophthalmology;  Laterality: Left;   CATARACT EXTRACTION W/PHACO Right 06/17/2015   Procedure: CATARACT EXTRACTION PHACO AND INTRAOCULAR LENS PLACEMENT RIGHT EYE CDE=11.09;  Surgeon: Tonny Branch, MD;  Location: [facility];  Service: Ophthalmology;  Laterality: Right;   COLONOSCOPY WITH PROPOFOL N/A 10/12/2020   Procedure: COLONOSCOPY WITH PROPOFOL;  Surgeon: [name], MD;  [facility];  Service: Gastroenterology;  Laterality: N/A;  10:35   COLONOSCOPY WITH PROPOFOL N/A 02/25/2021   Procedure: COLONOSCOPY WITH PROPOFOL;  Surgeon: [name], MD;  [facility];  Service: Gastroenterology;  Laterality: N/A;  8:10   CORONARY ANGIOPLASTY WITH STENT PLACEMENT  01/01/2018   CORONARY STENT INTERVENTION N/A 01/01/2018   Procedure: CORONARY STENT INTERVENTION;  Surgeon: Jettie Booze, MD;  Location: [place] [facility];  Service: Cardiovascular;  Laterality: N/A;   ENDOSCOPIC MUCOSAL RESECTION N/A 04/11/2021   Procedure: ENDOSCOPIC MUCOSAL RESECTION;  Surgeon: Rush Landmark Telford Nab., MD;  [facility];  Service: Gastroenterology;  Laterality: N/A;   ENTEROSCOPY N/A 10/12/2020   Procedure: PUSH ENTEROSCOPY;  Surgeon: [name], MD;  [facility];  Service: Gastroenterology;  Laterality: N/A;   ENTEROSCOPY N/A 04/11/2021  Procedure: ENTEROSCOPY;  Surgeon: Rush Landmark Telford Nab., MD;  Location: [facility];  Service: Gastroenterology;  Laterality: N/A;   ESOPHAGOGASTRODUODENOSCOPY (EGD) WITH PROPOFOL N/A 10/12/2020   Procedure: ESOPHAGOGASTRODUODENOSCOPY (EGD) WITH PROPOFOL;  Surgeon: [name], MD;  [facility];   Service: Gastroenterology;  Laterality: N/A;   FRACTURE SURGERY Left 2010   ankle   HEMOSTASIS CLIP PLACEMENT  04/11/2021   Procedure: HEMOSTASIS CLIP PLACEMENT;  Surgeon: Irving Copas., MD;  [facility];  Service: Gastroenterology;;   INSERTION PROSTATE RADIATION SEED  2007   KNEE ARTHROSCOPY Left    LAPAROSCOPY N/A 05/22/2021   Procedure: LAPAROSCOPY DIAGNOSTIC WITH WASHOUT OF ABDOMEN;  Surgeon: Leighton Ruff, MD;  [facility];  Service: General;  Laterality: N/A;   LEFT HEART CATH AND CORONARY ANGIOGRAPHY N/A 01/01/2018   Procedure: LEFT HEART CATH AND CORONARY ANGIOGRAPHY;  Surgeon: Jettie Booze, MD;  Location: [place] [facility];  Service: Cardiovascular;  Laterality: N/A;   ORIF TIBIA & FIBULA FRACTURES Left 2003   Distal tibial/fibula    PERICARDIAL WINDOW  02/2007   PERICARDIOCENTESIS  2007   hx/notes 10/19/2011   POLYPECTOMY  10/12/2020   Procedure: POLYPECTOMY;  Surgeon: [name], MD;  [facility];  Service: Gastroenterology;;  small bowel, cecal   POLYPECTOMY  02/25/2021   Procedure: POLYPECTOMY;  Surgeon: [name], MD;  [facility];  Service: Gastroenterology;;  transverse colon x2   SUBMUCOSAL LIFTING INJECTION  04/11/2021   Procedure: SUBMUCOSAL LIFTING INJECTION;  Surgeon: Irving Copas., MD;  Location: [facility];  Service: Gastroenterology;;   SUBMUCOSAL TATTOO INJECTION  04/11/2021   Procedure: SUBMUCOSAL TATTOO INJECTION;  Surgeon: Irving Copas., MD;  Location: [facility];  Service: Gastroenterology;;    SOCIAL HISTORY:  Social History   Socioeconomic History   Marital status: Married    Spouse name: Not on file   Number of children: Not on file   Years of education: Not on file   Highest education level: Not on file  Occupational History   Not on file  Tobacco Use   Smoking status: Former    Packs/day: 0.30    Years: 10.00    Total pack years: 3.00     Types: Cigarettes    Start date: 08/06/1958    Quit date: 1968    Years since quitting: 56.0   Smokeless tobacco: Never  Vaping Use   Vaping Use: Never used  Substance and Sexual Activity   Alcohol use: Not Currently   Drug use: Never   Sexual activity: Yes  Other Topics Concern   Not on file  Social History Narrative   Not on file   Social Determinants of Health   Financial Resource Strain: Not on file  Food Insecurity: Not on file  Transportation Needs: Not on file  Physical Activity: Not on file  Stress: Not on file  Social Connections: Not on file  Intimate Partner Violence: Not on file    FAMILY HISTORY:  Family History  Problem Relation Age of Onset   CAD Mother        MI in 45s   Prostate cancer Father    Prostate cancer Brother    Lung cancer Brother     CURRENT MEDICATIONS:  Current Outpatient Medications  Medication Sig Dispense Refill   acetaminophen (TYLENOL) 325 MG tablet Take 650 mg by mouth as needed (pain or fever).     apixaban (ELIQUIS) 5 MG TABS tablet Take 1  tablet (5 mg total) by mouth 2 (two) times daily. 60 tablet 6   atorvastatin (LIPITOR) 80 MG tablet TAKE 1 TABLET BY MOUTH ONCE DAILY AT  6  PM 90 tablet 2   benazepril (LOTENSIN) 40 MG tablet TAKE 1 TABLET BY MOUTH ONCE DAILY *NEEDS  OFFICE  VISIT* 90 tablet 2   capecitabine (XELODA) 500 MG tablet Take 2 tablets (1,000 mg total) by mouth 2 (two) times daily after a meal. Take for 14 days, then hold for 7 days. Repeat every 21 days 56 tablet 1   cyanocobalamin 1000 MCG tablet Take 1 tablet (1,000 mcg total) by mouth daily.     diltiazem (CARDIZEM CD) 180 MG 24 hr capsule Take 1 capsule (180 mg total) by mouth daily. 30 capsule 1   dorzolamide (TRUSOPT) 2 % ophthalmic solution Place 1 drop into both eyes 2 (two) times daily.     febuxostat (ULORIC) 40 MG tablet Take 1 tablet (40 mg total) by mouth daily. 30 tablet 6   FERREX 150 150 MG capsule Take 150 mg by mouth 2 (two) times daily.      hydrALAZINE (APRESOLINE) 100 MG tablet Take 100 mg by mouth 2 (two) times daily.     latanoprost (XALATAN) 0.005 % ophthalmic solution Place 1 drop into both eyes at bedtime.     loperamide (IMODIUM) 2 MG capsule Take 2 mg by mouth as needed for diarrhea or loose stools.     metFORMIN (GLUCOPHAGE-XR) 500 MG 24 hr tablet Take 500 mg by mouth 2 (two) times daily.     metoprolol succinate (TOPROL-XL) 50 MG 24 hr tablet Take 50 mg by mouth daily. Take with or immediately following a meal.     Multiple Vitamin (MULTIVITAMIN) tablet Take 1 tablet by mouth in the morning.     nystatin cream (MYCOSTATIN) Apply topically 3 (three) times daily.     ONETOUCH ULTRA test strip USE 1 STRIP TO CHECK GLUCOSE ONCE DAILY     prochlorperazine (COMPAZINE) 10 MG tablet Take 1 tablet (10 mg total) by mouth every 6 (six) hours as needed for nausea or vomiting. (Patient taking differently: Take 10 mg by mouth as needed for nausea or vomiting.) 60 tablet 3   SSD 1 % cream Apply topically as needed.     traMADol (ULTRAM) 50 MG tablet Take 50 mg by mouth every 8 (eight) hours as needed.     urea (CARMOL) 10 % cream Apply topically 2 (two) times daily. Apply topically to hands twice a day 71 g 2   vitamin C (ASCORBIC ACID) 500 MG tablet Take 500 mg by mouth 2 (two) times daily.      No current facility-administered medications for this visit.    ALLERGIES:  Allergies  Allergen Reactions   Amiodarone Rash    PHYSICAL EXAM:  Performance status (ECOG): 1 - Symptomatic but completely ambulatory  Vitals:   03/13/22 1418  BP: (!) 141/60  Pulse: 65  Resp: 18  Temp: 98.2 F (36.8 C)  SpO2: 97%   Wt Readings from Last 3 Encounters:  03/13/22 207 lb 12.8 oz (94.3 kg)  02/06/22 210 lb 9.6 oz (95.5 kg)  12/29/21 210 lb 8.6 oz (95.5 kg)   Physical Exam Vitals reviewed.  Constitutional:      Appearance: Normal appearance.  Cardiovascular:     Rate and Rhythm: Normal rate and regular rhythm.     Pulses: Normal  pulses.     Heart sounds: Normal heart sounds.  Pulmonary:  Effort: Pulmonary effort is normal.     Breath sounds: Normal breath sounds.  Skin:    Findings: Erythema (soles of feet bilaterally) present.     Comments: Skin peeling on soles of feet bilaterally  Neurological:     General: No focal deficit present.     Mental Status: He is alert and oriented to person, place, and time.  Psychiatric:        Mood and Affect: Mood normal.        Behavior: Behavior normal.     LABORATORY DATA:  I have reviewed the labs as listed.     Latest Ref Rng & Units 03/07/2022   11:24 AM 01/30/2022   11:45 AM 12/29/2021    9:25 AM  CBC  WBC 4.0 - 10.5 K/uL 8.9  6.7  5.0   Hemoglobin 13.0 - 17.0 g/dL 11.2  11.2  12.2   Hematocrit 39.0 - 52.0 % 35.2  33.8  36.4   Platelets 150 - 400 K/uL 292  205  158       Latest Ref Rng & Units 03/07/2022   11:24 AM 01/30/2022   11:45 AM 12/29/2021    9:25 AM  CMP  Glucose 70 - 99 mg/dL 165  179  203   BUN 8 - 23 mg/dL '14  20  21   '$ Creatinine 0.61 - 1.24 mg/dL 0.82  0.90  1.18   Sodium 135 - 145 mmol/L 137  135  138   Potassium 3.5 - 5.1 mmol/L 4.3  4.3  3.7   Chloride 98 - 111 mmol/L 103  104  107   CO2 22 - 32 mmol/L '24  25  23   '$ Calcium 8.9 - 10.3 mg/dL 9.6  9.4  9.0   Total Protein 6.5 - 8.1 g/dL 7.2  6.5  6.5   Total Bilirubin 0.3 - 1.2 mg/dL 0.2  1.0  0.9   Alkaline Phos 38 - 126 U/L 71  80  79   AST 15 - 41 U/L '19  21  27   '$ ALT 0 - 44 U/L '18  19  22     '$ DIAGNOSTIC IMAGING:  I have independently reviewed the scans and discussed with the patient. CT Abdomen Pelvis W Contrast  Result Date: 03/07/2022 CLINICAL DATA:  Follow-up metastatic colon carcinoma. * Tracking Code: BO * EXAM: CT ABDOMEN AND PELVIS WITH CONTRAST TECHNIQUE: Multidetector CT imaging of the abdomen and pelvis was performed using the standard protocol following bolus administration of intravenous contrast. RADIATION DOSE REDUCTION: This exam was performed according to the  departmental dose-optimization program which includes automated exposure control, adjustment of the mA and/or kV according to patient size and/or use of iterative reconstruction technique. CONTRAST:  128m OMNIPAQUE IOHEXOL 300 MG/ML  SOLN COMPARISON:  11/08/2021 FINDINGS: Lower Chest: No acute findings. Stable sub-cm pulmonary nodule in posterior left lower lobe, likely benign. Stable small pericardial effusion. Hepatobiliary: Multiple tiny hepatic cysts show no significant change. No new or enlarging liver lesions identified. Gallbladder is unremarkable. No evidence of biliary ductal dilatation. Pancreas: Stable 1.4 cm simple appearing cystic lesion in the pancreatic body. No evidence of pancreatic ductal dilatation or pancreatic inflammatory changes. Spleen: Within normal limits in size and appearance. Adrenals/Urinary Tract: No suspicious masses identified. No evidence of ureteral calculi or hydronephrosis. Stomach/Bowel: Stable postop changes from right hemicolectomy. Evidence of bowel obstruction, inflammatory process, or abnormal fluid collections. Vascular/Lymphatic: Mild right abdominal mesenteric lymphadenopathy is again seen with largest lymph node measuring 12 mm  on image 45/2, without significant change. Increased lymphadenopathy is seen in the right common and external iliac chains, with largest lymph node measuring 1.5 cm on image 59/2 compared to 8 mm previously. Aortic atherosclerotic calcification incidentally noted. No acute vascular findings. Reproductive: Brachytherapy seeds again seen throughout the prostate gland. Other: Stable small bilateral inguinal hernias, which contain only fat. Musculoskeletal:  No suspicious bone lesions identified. IMPRESSION: Increased mild right common and external iliac lymphadenopathy, consistent with metastatic disease. Stable mild right abdominal mesenteric lymphadenopathy, suspicious for metastatic disease. Stable simple appearing cystic lesion in pancreatic  body, likely an indolent cystic neoplasm. Given comorbidities, recommend continued attention on follow-up imaging. Stable small bilateral inguinal hernias, which contain only fat. Stable small pericardial effusion. Aortic Atherosclerosis (ICD10-I70.0). Electronically Signed   By: Marlaine Hind M.D.   On: 03/07/2022 18:30     ASSESSMENT:  Colon cancer: - Colonoscopy on 10/12/2020 with 7 sessile polyps found in the transverse colon and cecum.  12 mm polyp found in the transverse colon, sessile.  Polyp was removed with piecemeal technique using cold snare.  2 sessile polyps found in the sigmoid colon, removed with cold snare. - Pathology cecal tubular adenoma, sessile serrated polyp of the transverse colon polypectomy, adenocarcinoma arising from a tubular adenoma with high-grade dysplasia of the transverse colon polypectomy.  Sigmoid colon polypectomy showed tubular adenoma, hyperplastic polyp. - Colonoscopy on 02/25/2021 with three 3 to 5 mm polyps in the transverse colon, removed with cold snare.  One 10 mm polyp in the descending colon at 54 cm proximal to the anus. - Pathology on 02/25/2021 shows invasive adenocarcinoma, poorly differentiated, grade 3 of the descending colon polypectomy.  CDX2 positive.  Other findings include transverse colon tubular adenoma and sessile serrated adenoma. - Right hemicolectomy on 05/20/2021. - Pathology shows grade 3 adenocarcinoma, mucinous features, 1.7 cm in the transverse colon, metastatic adenocarcinoma involving 2/29 lymph nodes.  No LVI/perineural invasion.  pT1, PN 1B. - Adjuvant Xeloda 1500 mg 2 weeks on/1 week off started on 07/06/2021.  Cycle 2 on 07/27/2021, 3 tablets twice daily.  Cycle 3 on 08/26/2021, 2 tablets twice daily 2 weeks on/1 week off, dose reduced secondary to HFSR.  Cycle 4 on 09/16/2021.  Cycle 5 Xeloda 1000 mg twice daily on 10/08/2021.  Completed cycle 8 on 01/30/2022.    Social/family history: He lives at home with his wife.  He quit smoking 23  years ago.  He smoked 1 pack/day for 7 years.  He worked in [place] prior to retirement.  He is active and walks 2 miles per day. - Father had prostate cancer.  Brother had lung cancer.  Another brother had colon cancer (? colon), Sister with lung cancer, another sister with cancer, patient unknown.  3.  Prostate cancer: - He was diagnosed with prostate cancer in August 2007, status post seed implants.  His PSA has been undetectable since then.   PLAN:  Stage III (T1N1B) transverse colon adenocarcinoma: - PET scan on 11/24/2021: Mesenteric adenopathy is non-FDG avid.  Omental thickening is also not FDG avid.  No new sites. - Reviewed labs from 03/07/2022.  CEA has improved to 6.2 from 12.2 on 01/30/2022.  LFTs are also normal. - Reviewed CT [facility] (02/27/2022): 1.5 cm right common and external iliac chain lymph node measuring 8 mm previously.  Mesenteric adenopathy measuring 12 mm is stable from the prior scan.  No new findings. - As his CEA has improved, I have recommended follow-up scan in 3 months with repeat  CEA.  2.  Normocytic anemia: - His hemoglobin is 11.2 and stable.  Will check ferritin and iron panel at next visit.  3.  Right knee swelling and pain: - Pain is improving.  Continue follow-up with orthopedics.   Orders placed this encounter:  No orders of the defined types were placed in this encounter.      Derek Jack, MD [place] [phone]

## 2022-03-13 NOTE — Patient Instructions (Addendum)
[facility]  Discharge Instructions  You were seen and examined today by Dr. Delton Coombes.  Dr. Delton Coombes discussed your most recent lab work and CT scan which revealed that a lymph node that has increased in size but the cancer count is better. This lymph node could be swollen if you have had a recent infection.  Follow-up as scheduled in 10 weeks with labs and scan.    Thank you for choosing [facility] to provide your oncology and hematology care.   To afford each patient quality time with our provider, please arrive at least 15 minutes before your scheduled appointment time. You may need to reschedule your appointment if you arrive late (10 or more minutes). Arriving late affects you and other patients whose appointments are after yours.  Also, if you miss three or more appointments without notifying the office, you may be dismissed from the clinic at the provider's discretion.    Again, thank you for choosing [facility].  Our hope is that these requests will decrease the amount of time that you wait before being seen by our physicians.   If you have a lab appointment with the [facility] please come in thru the Main Entrance and check in at the main information desk.           _____________________________________________________________  Should you have questions after your visit to [facility], please contact our office at [phone] and follow the prompts.  Our office hours are 8:00 a.m. to 4:30 p.m. Monday - Thursday and 8:00 a.m. to 2:30 p.m. Friday.  Please note that voicemails left after 4:00 p.m. may not be returned until the following business day.  We are closed weekends and all major holidays.  You do have access to a nurse 24-7, just call the main number to the clinic [phone] and do not press any options, hold on the line and a nurse will answer the phone.    For prescription refill  requests, have your pharmacy contact our office and allow 72 hours.    Masks are optional in the cancer centers. If you would like for your care team to wear a mask while they are taking care of you, please let them know. You may have one support person who is at least 81 years old accompany you for your appointments.

## 2022-03-28 ENCOUNTER — Other Ambulatory Visit: Payer: Self-pay | Admitting: Cardiology

## 2022-03-30 ENCOUNTER — Other Ambulatory Visit (HOSPITAL_COMMUNITY): Payer: Self-pay

## 2022-04-04 DIAGNOSIS — H524 Presbyopia: Secondary | ICD-10-CM | POA: Diagnosis not present

## 2022-04-04 DIAGNOSIS — H35033 Hypertensive retinopathy, bilateral: Secondary | ICD-10-CM | POA: Diagnosis not present

## 2022-04-11 DIAGNOSIS — E1122 Type 2 diabetes mellitus with diabetic chronic kidney disease: Secondary | ICD-10-CM | POA: Diagnosis not present

## 2022-04-11 DIAGNOSIS — D509 Iron deficiency anemia, unspecified: Secondary | ICD-10-CM | POA: Diagnosis not present

## 2022-04-11 DIAGNOSIS — E782 Mixed hyperlipidemia: Secondary | ICD-10-CM | POA: Diagnosis not present

## 2022-04-11 DIAGNOSIS — I10 Essential (primary) hypertension: Secondary | ICD-10-CM | POA: Diagnosis not present

## 2022-04-11 DIAGNOSIS — E7849 Other hyperlipidemia: Secondary | ICD-10-CM | POA: Diagnosis not present

## 2022-04-13 ENCOUNTER — Telehealth: Payer: Self-pay

## 2022-04-13 ENCOUNTER — Other Ambulatory Visit: Payer: Self-pay

## 2022-04-13 DIAGNOSIS — D132 Benign neoplasm of duodenum: Secondary | ICD-10-CM

## 2022-04-13 NOTE — Telephone Encounter (Signed)
Enteroscopy 05/11/22 at 1 pm at [facility] with GM  Eliquis letter sent to Dr Domenic Polite.    Left message on machine to call back

## 2022-04-13 NOTE — Telephone Encounter (Signed)
-----  Message from Timothy Lasso, RN sent at 04/13/2021 12:51 PM EST ----- 1 year SBE recall.  Thanks. (Small bowel enteroscopy)

## 2022-04-13 NOTE — Telephone Encounter (Signed)
[facility] Pre-operative Risk Assessment     Request for surgical clearance:     Endoscopy Procedure  What type of surgery is being performed?     Small bowel enteroscopy   When is this surgery scheduled?     05/11/22  What type of clearance is required ?   Pharmacy  Are there any medications that need to be held prior to surgery and how long? Eliquis  Practice name and name of physician performing surgery?      [facility]  What is your office phone and fax number?      Phone- [phone]  [phone]  Anesthesia type (None, local, MAC, general) ?       MAC

## 2022-04-14 NOTE — Telephone Encounter (Signed)
Left message on machine to call back  

## 2022-04-17 NOTE — Telephone Encounter (Signed)
   Patient Name: [name] [name]  DOB: Oct 22, 1940 MRN: [id number]  Primary Cardiologist: Rozann Lesches, MD  Clinical pharmacists have reviewed the patient's past medical history, labs, and current medications as part of preoperative protocol coverage. The following recommendations have been made:  Patient with diagnosis of afib on Eliquis for anticoagulation.     Procedure: Small bowel enteroscopy   Date of procedure: 05/11/22  CHA2DS2-VASc Score = 5   This indicates a 7.2% annual risk of stroke. The patient's score is based upon: CHF History: 0 HTN History: 1 Diabetes History: 1 Stroke History: 0 Vascular Disease History: 1 Age Score: 2 Gender Score: 0       CrCl 83 ml/min   Per office protocol, patient can hold Eliquis for 2 days prior to procedure.  Please resume Eliquis as soon as possible postprocedure, at the discretion of the surgeon.   I will route this recommendation to the requesting party via Epic fax function and remove from pre-op pool.  Please call with questions.  Lenna Sciara, NP 04/17/2022, 11:58 AM

## 2022-04-17 NOTE — Telephone Encounter (Signed)
Patient with diagnosis of afib on Eliquis for anticoagulation.    Procedure: Small bowel enteroscopy   Date of procedure: 05/11/22   CHA2DS2-VASc Score = 5   This indicates a 7.2% annual risk of stroke. The patient's score is based upon: CHF History: 0 HTN History: 1 Diabetes History: 1 Stroke History: 0 Vascular Disease History: 1 Age Score: 2 Gender Score: 0      CrCl 83 ml/min  Per office protocol, patient can hold Eliquis for 2 days prior to procedure.    **This guidance is not considered finalized until pre-operative APP has relayed final recommendations.**

## 2022-04-17 NOTE — Telephone Encounter (Signed)
Left message on machine to call back  

## 2022-04-17 NOTE — Telephone Encounter (Signed)
Patient with diagnosis of afib on Eliquis for anticoagulation.     Procedure: Small bowel enteroscopy   Date of procedure: 05/11/22     CHA2DS2-VASc Score = 5   This indicates a 7.2% annual risk of stroke. The patient's score is based upon: CHF History: 0 HTN History: 1 Diabetes History: 1 Stroke History: 0 Vascular Disease History: 1 Age Score: 2 Gender Score: 0       CrCl 83 ml/min   Per office protocol, patient can hold Eliquis for 2 days prior to procedure.

## 2022-04-18 DIAGNOSIS — M1000 Idiopathic gout, unspecified site: Secondary | ICD-10-CM | POA: Diagnosis not present

## 2022-04-18 DIAGNOSIS — I4891 Unspecified atrial fibrillation: Secondary | ICD-10-CM | POA: Diagnosis not present

## 2022-04-18 DIAGNOSIS — G2581 Restless legs syndrome: Secondary | ICD-10-CM | POA: Diagnosis not present

## 2022-04-18 DIAGNOSIS — E7849 Other hyperlipidemia: Secondary | ICD-10-CM | POA: Diagnosis not present

## 2022-04-18 DIAGNOSIS — D509 Iron deficiency anemia, unspecified: Secondary | ICD-10-CM | POA: Diagnosis not present

## 2022-04-18 DIAGNOSIS — J301 Allergic rhinitis due to pollen: Secondary | ICD-10-CM | POA: Diagnosis not present

## 2022-04-18 DIAGNOSIS — E1169 Type 2 diabetes mellitus with other specified complication: Secondary | ICD-10-CM | POA: Diagnosis not present

## 2022-04-18 DIAGNOSIS — E6609 Other obesity due to excess calories: Secondary | ICD-10-CM | POA: Diagnosis not present

## 2022-04-18 DIAGNOSIS — Z0001 Encounter for general adult medical examination with abnormal findings: Secondary | ICD-10-CM | POA: Diagnosis not present

## 2022-04-18 DIAGNOSIS — D692 Other nonthrombocytopenic purpura: Secondary | ICD-10-CM | POA: Diagnosis not present

## 2022-04-18 DIAGNOSIS — C189 Malignant neoplasm of colon, unspecified: Secondary | ICD-10-CM | POA: Diagnosis not present

## 2022-04-18 DIAGNOSIS — I10 Essential (primary) hypertension: Secondary | ICD-10-CM | POA: Diagnosis not present

## 2022-04-18 NOTE — Telephone Encounter (Signed)
Left message on machine to call back  

## 2022-04-19 NOTE — Telephone Encounter (Signed)
SBE scheduled, pt instructed and medications reviewed.  Patient instructions mailed to home and sent to My Chart.  Patient to call with any questions or concerns.   He is aware to hold Eliquis 2 days prior

## 2022-05-02 ENCOUNTER — Encounter (HOSPITAL_COMMUNITY): Payer: Self-pay | Admitting: Gastroenterology

## 2022-05-08 DIAGNOSIS — M79671 Pain in right foot: Secondary | ICD-10-CM | POA: Diagnosis not present

## 2022-05-08 DIAGNOSIS — I739 Peripheral vascular disease, unspecified: Secondary | ICD-10-CM | POA: Diagnosis not present

## 2022-05-08 DIAGNOSIS — M79675 Pain in left toe(s): Secondary | ICD-10-CM | POA: Diagnosis not present

## 2022-05-08 DIAGNOSIS — E1140 Type 2 diabetes mellitus with diabetic neuropathy, unspecified: Secondary | ICD-10-CM | POA: Diagnosis not present

## 2022-05-08 DIAGNOSIS — M79672 Pain in left foot: Secondary | ICD-10-CM | POA: Diagnosis not present

## 2022-05-08 DIAGNOSIS — L110 Acquired keratosis follicularis: Secondary | ICD-10-CM | POA: Diagnosis not present

## 2022-05-08 DIAGNOSIS — M79674 Pain in right toe(s): Secondary | ICD-10-CM | POA: Diagnosis not present

## 2022-05-11 ENCOUNTER — Ambulatory Visit (HOSPITAL_COMMUNITY): Payer: Medicare HMO | Admitting: Anesthesiology

## 2022-05-11 ENCOUNTER — Encounter (HOSPITAL_COMMUNITY): Payer: Self-pay | Admitting: Gastroenterology

## 2022-05-11 ENCOUNTER — Telehealth: Payer: Self-pay

## 2022-05-11 ENCOUNTER — Ambulatory Visit (HOSPITAL_COMMUNITY)
Admission: RE | Admit: 2022-05-11 | Discharge: 2022-05-11 | Disposition: A | Discharge status: Home / Self Care | Payer: Medicare HMO | Attending: Gastroenterology | Admitting: Gastroenterology

## 2022-05-11 ENCOUNTER — Encounter (HOSPITAL_COMMUNITY)
Admission: RE | Disposition: A | Discharge status: Home / Self Care | Payer: Self-pay | Source: Home / Self Care | Attending: Gastroenterology

## 2022-05-11 ENCOUNTER — Other Ambulatory Visit: Payer: Self-pay

## 2022-05-11 DIAGNOSIS — Z5309 Procedure and treatment not carried out because of other contraindication: Secondary | ICD-10-CM | POA: Diagnosis not present

## 2022-05-11 DIAGNOSIS — Z85038 Personal history of other malignant neoplasm of large intestine: Secondary | ICD-10-CM | POA: Diagnosis not present

## 2022-05-11 DIAGNOSIS — E119 Type 2 diabetes mellitus without complications: Secondary | ICD-10-CM | POA: Insufficient documentation

## 2022-05-11 DIAGNOSIS — I4891 Unspecified atrial fibrillation: Secondary | ICD-10-CM | POA: Insufficient documentation

## 2022-05-11 DIAGNOSIS — M1990 Unspecified osteoarthritis, unspecified site: Secondary | ICD-10-CM | POA: Diagnosis not present

## 2022-05-11 DIAGNOSIS — Z87891 Personal history of nicotine dependence: Secondary | ICD-10-CM | POA: Diagnosis not present

## 2022-05-11 DIAGNOSIS — I252 Old myocardial infarction: Secondary | ICD-10-CM | POA: Diagnosis not present

## 2022-05-11 DIAGNOSIS — K219 Gastro-esophageal reflux disease without esophagitis: Secondary | ICD-10-CM | POA: Insufficient documentation

## 2022-05-11 DIAGNOSIS — I358 Other nonrheumatic aortic valve disorders: Secondary | ICD-10-CM | POA: Insufficient documentation

## 2022-05-11 DIAGNOSIS — Z8546 Personal history of malignant neoplasm of prostate: Secondary | ICD-10-CM | POA: Diagnosis not present

## 2022-05-11 DIAGNOSIS — Z8719 Personal history of other diseases of the digestive system: Secondary | ICD-10-CM | POA: Insufficient documentation

## 2022-05-11 DIAGNOSIS — Z7984 Long term (current) use of oral hypoglycemic drugs: Secondary | ICD-10-CM | POA: Insufficient documentation

## 2022-05-11 DIAGNOSIS — I10 Essential (primary) hypertension: Secondary | ICD-10-CM | POA: Insufficient documentation

## 2022-05-11 DIAGNOSIS — I2510 Atherosclerotic heart disease of native coronary artery without angina pectoris: Secondary | ICD-10-CM | POA: Diagnosis not present

## 2022-05-11 HISTORY — DX: Cardiac arrhythmia, unspecified: I49.9

## 2022-05-11 LAB — GLUCOSE, CAPILLARY: Glucose-Capillary: 105 mg/dL — ABNORMAL HIGH (ref 70–99)

## 2022-05-11 SURGERY — CANCELLED PROCEDURE

## 2022-05-11 MED ORDER — PROPOFOL 10 MG/ML IV BOLUS
INTRAVENOUS | Status: AC
  Filled 2022-05-11: qty 20

## 2022-05-11 MED ORDER — LACTATED RINGERS IV SOLN: INTRAVENOUS | Status: AC | PRN

## 2022-05-11 MED ORDER — PROPOFOL 500 MG/50ML IV EMUL
INTRAVENOUS | Status: AC
  Filled 2022-05-11: qty 50

## 2022-05-11 NOTE — H&P (Addendum)
Today, the patient came in for an enteroscopy.  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  Patient had followed directions as given by our preprocedure team in regards to holding certain blood pressure medications.  Unfortunately, his blood pressures today in the endoscopy unit are in the 230s to 250s over 70s with a heart rate in the 50s.  Although hemodynamically stable and not having any signs or symptoms of chest pain/chest pressure/shortness of breath/dyspnea on exertion, is felt that his procedure cannot proceed today by our anesthesia team.  I am in agreement.  There has been a modification for follow-up enteroscopy scheduling.  He will take his beta-blocker, his calcium channel blocker, hydralazine for his blood pressure control on the day of his procedure.  He will continue to hold his ARB.  I discussed this with the patient as well as the patient's wife and they are in agreement and okay to postpone procedure.  We will update the patient's referring provider as well.   Justice Britain, MD [facility] Advanced Endoscopy Office # [phone]

## 2022-05-11 NOTE — Anesthesia Preprocedure Evaluation (Addendum)
Anesthesia Evaluation    Reviewed: Allergy & Precautions, Patient's Chart, lab work & pertinent test results, Unable to perform ROS - Chart review only  History of Anesthesia Complications Negative for: history of anesthetic complications  Airway        Dental   Pulmonary former smoker          Cardiovascular hypertension, Pt. on medications and Pt. on home beta blockers + CAD, + Past MI and + Cardiac Stents (2019)  + dysrhythmias Atrial Fibrillation   Echo 04/2021  1. Left ventricular ejection fraction, by estimation, is 70 to 75%. The left ventricle has hyperdynamic function. The left ventricle has no regional wall motion abnormalities. There is severe concentric left ventricular hypertrophy. There is a resting LVOT gradient of 71mHg. Findings suggestive of HOCM.  Left ventricular diastolic parameters are consistent with Grade I diastolic dysfunction (impaired relaxation). Elevated left ventricular end-diastolic pressure.   2. Right ventricular systolic function is normal. The right ventricular size is normal. Tricuspid regurgitation signal is inadequate for assessing PA pressure.   3. The mitral valve is normal in structure. No evidence of mitral valve regurgitation. No evidence of mitral stenosis.   4. The aortic valve is calcified. Aortic valve regurgitation is not visualized. Aortic valve sclerosis/calcification is present, without any evidence of aortic stenosis. Aortic valve area, by VTI measures 3.17 cm. Aortic valve mean gradient measures 9.0 mmHg. Aortic valve Vmax measures 2.10 m/s.   5. There is a mobile density in the RVOT in the vicinity of the pulmonic valve. Cannot rule out vegetation vs thrombus by this study.   6. Aortic dilatation noted. There is borderline dilatation of the aortic root, measuring 37 mm. There is borderline dilatation of the ascending aorta, measuring 37 mm.   7. The inferior vena cava is normal in size  with greater than 50% respiratory variability, suggesting right atrial pressure of 3 mmHg.   8. A small pericardial effusion is present. The pericardial effusion is circumferential. There is no evidence of cardiac tamponade.   9. Consider Chest CTA to rule out PE an if negative consider TEE to assess RVOT and PV further given mobile target if endocarditis if a     H/o pericardial effusion s/p pericardial window  Echo 2020: EF 70-75%, mod-severe LVH, g1dd, nl RV size/fn, severe LAE, small circumferential pericardial effusion, restricted motion of L coronary cusp with no definite AS, mild aortic root dilatation   Neuro/Psych  Headaches    GI/Hepatic Neg liver ROS,GERD  ,,Colon cancer   Endo/Other  diabetes, Type 2, Oral Hypoglycemic Agents    Renal/GU ARFRenal disease   H/o prostate cancer    Musculoskeletal  (+) Arthritis ,    Abdominal   Peds  Hematology  (+) Blood dyscrasia, anemia Eliquis   Anesthesia Other Findings  Pt is postop day 2 from robotic colectomy. Patient became hemodynamically unstable in PACU following surgery with acute drop in hemoglobin and suspicion for postop bleeding. He was given multiple units of blood and transferred to ICU where he required high dose vasopressors overnight on POD0. He has since stabilized and is now off vasopressors, but had another significant drop in hemoglobin overnight concerning for ongoing bleeding. He presents for reexploration. He has received a total of 5U PRBC and 3U FFP over the last 48 hours.  Reproductive/Obstetrics                             Anesthesia Physical  Anesthesia Plan  ASA: 4  Anesthesia Plan: MAC   Post-op Pain Management: Minimal or no pain anticipated   Induction: Intravenous and Rapid sequence  PONV Risk Score and Plan: 2 and Ondansetron, Treatment may vary due to age or medical condition and Propofol infusion  Airway Management Planned: Natural Airway  Additional  Equipment: None  Intra-op Plan:   Post-operative Plan:   Informed Consent:   Plan Discussed with:   Anesthesia Plan Comments: (See PAT note 05/05/2021, Konrad Felix Ward, PA-C)        Anesthesia Quick Evaluation

## 2022-05-11 NOTE — Progress Notes (Signed)
Patient being admitted to endo.  BP 213/62. Anesthesia at bedside as starting to review medications. Per discussion with anesthesia and Dr. Rush Landmark will reschedule. Patient discharged with instructions to take home medication today and will reschedule procedure

## 2022-05-11 NOTE — Progress Notes (Addendum)
Anesthesia note: BP in pre procedure 236/75. Given his history of HOCM, my assumption his BP is not well controlled at baseline, however, based on preop call pt was told not to take 3 of his BP medications. Discussed with Dr. Rush Landmark. Will reschedule. To prevent hypertensive emergency in future, he should take his hydralzaine and diltiazem prior to his procedure when rescheduled.

## 2022-05-11 NOTE — Telephone Encounter (Signed)
New instructions have been mailed to the pt.

## 2022-05-12 ENCOUNTER — Encounter (INDEPENDENT_AMBULATORY_CARE_PROVIDER_SITE_OTHER): Payer: Self-pay | Admitting: *Deleted

## 2022-05-13 DIAGNOSIS — I10 Essential (primary) hypertension: Secondary | ICD-10-CM | POA: Diagnosis not present

## 2022-05-13 DIAGNOSIS — Z6829 Body mass index (BMI) 29.0-29.9, adult: Secondary | ICD-10-CM | POA: Diagnosis not present

## 2022-05-13 DIAGNOSIS — J329 Chronic sinusitis, unspecified: Secondary | ICD-10-CM | POA: Diagnosis not present

## 2022-05-15 ENCOUNTER — Encounter (HOSPITAL_COMMUNITY): Payer: Self-pay | Admitting: Radiology

## 2022-05-15 ENCOUNTER — Inpatient Hospital Stay: Payer: Medicare HMO | Attending: Hematology

## 2022-05-15 ENCOUNTER — Ambulatory Visit (HOSPITAL_COMMUNITY)
Admission: RE | Admit: 2022-05-15 | Discharge: 2022-05-15 | Disposition: A | Discharge status: Home / Self Care | Payer: Medicare HMO | Source: Ambulatory Visit | Attending: Hematology | Admitting: Hematology

## 2022-05-15 DIAGNOSIS — Z8546 Personal history of malignant neoplasm of prostate: Secondary | ICD-10-CM | POA: Insufficient documentation

## 2022-05-15 DIAGNOSIS — Z79899 Other long term (current) drug therapy: Secondary | ICD-10-CM | POA: Insufficient documentation

## 2022-05-15 DIAGNOSIS — C785 Secondary malignant neoplasm of large intestine and rectum: Secondary | ICD-10-CM | POA: Diagnosis not present

## 2022-05-15 DIAGNOSIS — C184 Malignant neoplasm of transverse colon: Secondary | ICD-10-CM | POA: Diagnosis not present

## 2022-05-15 DIAGNOSIS — D509 Iron deficiency anemia, unspecified: Secondary | ICD-10-CM

## 2022-05-15 DIAGNOSIS — N200 Calculus of kidney: Secondary | ICD-10-CM | POA: Diagnosis not present

## 2022-05-15 DIAGNOSIS — D649 Anemia, unspecified: Secondary | ICD-10-CM | POA: Diagnosis not present

## 2022-05-15 DIAGNOSIS — C801 Malignant (primary) neoplasm, unspecified: Secondary | ICD-10-CM | POA: Diagnosis not present

## 2022-05-15 LAB — CBC WITH DIFFERENTIAL/PLATELET
Abs Immature Granulocytes: 0.03 10*3/uL (ref 0.00–0.07)
Basophils Absolute: 0 10*3/uL (ref 0.0–0.1)
Basophils Relative: 1 %
Eosinophils Absolute: 0.1 10*3/uL (ref 0.0–0.5)
Eosinophils Relative: 2 %
HCT: 40.4 % (ref 39.0–52.0)
Hemoglobin: 12.8 g/dL — ABNORMAL LOW (ref 13.0–17.0)
Immature Granulocytes: 1 %
Lymphocytes Relative: 19 %
Lymphs Abs: 1.2 10*3/uL (ref 0.7–4.0)
MCH: 29.1 pg (ref 26.0–34.0)
MCHC: 31.7 g/dL (ref 30.0–36.0)
MCV: 91.8 fL (ref 80.0–100.0)
Monocytes Absolute: 0.6 10*3/uL (ref 0.1–1.0)
Monocytes Relative: 9 %
Neutro Abs: 4.2 10*3/uL (ref 1.7–7.7)
Neutrophils Relative %: 68 %
Platelets: 159 10*3/uL (ref 150–400)
RBC: 4.4 MIL/uL (ref 4.22–5.81)
RDW: 17 % — ABNORMAL HIGH (ref 11.5–15.5)
WBC: 6.1 10*3/uL (ref 4.0–10.5)
nRBC: 0 % (ref 0.0–0.2)

## 2022-05-15 LAB — COMPREHENSIVE METABOLIC PANEL
ALT: 25 U/L (ref 0–44)
AST: 29 U/L (ref 15–41)
Albumin: 4.1 g/dL (ref 3.5–5.0)
Alkaline Phosphatase: 79 U/L (ref 38–126)
Anion gap: 7 (ref 5–15)
BUN: 13 mg/dL (ref 8–23)
CO2: 27 mmol/L (ref 22–32)
Calcium: 9.4 mg/dL (ref 8.9–10.3)
Chloride: 104 mmol/L (ref 98–111)
Creatinine, Ser: 0.71 mg/dL (ref 0.61–1.24)
GFR, Estimated: >60 mL/min (ref >60)
Glucose, Bld: 129 mg/dL — ABNORMAL HIGH (ref 70–99)
Potassium: 3.7 mmol/L (ref 3.5–5.1)
Sodium: 138 mmol/L (ref 135–145)
Total Bilirubin: 0.6 mg/dL (ref 0.3–1.2)
Total Protein: 7.1 g/dL (ref 6.5–8.1)

## 2022-05-15 LAB — IRON AND TIBC
Iron: 90 ug/dL (ref 45–182)
Saturation Ratios: 28 % (ref 17.9–39.5)
TIBC: 322 ug/dL (ref 250–450)
UIBC: 232 ug/dL

## 2022-05-15 LAB — FERRITIN: Ferritin: 32 ng/mL (ref 24–336)

## 2022-05-15 MED ORDER — IOHEXOL 300 MG/ML  SOLN
100.0000 mL | Freq: Once | INTRAMUSCULAR | Status: AC | PRN
  Administered 2022-05-15: 100 mL via INTRAVENOUS

## 2022-05-17 LAB — CEA: CEA: 6.2 ng/mL — ABNORMAL HIGH (ref 0.0–4.7)

## 2022-05-22 ENCOUNTER — Encounter: Payer: Self-pay | Admitting: Hematology

## 2022-05-22 ENCOUNTER — Inpatient Hospital Stay: Payer: Medicare HMO | Admitting: Hematology

## 2022-05-22 VITALS — BP 138/60 | HR 55 | Temp 97.8°F | Resp 18 | Wt 214.8 lb

## 2022-05-22 DIAGNOSIS — Z8546 Personal history of malignant neoplasm of prostate: Secondary | ICD-10-CM | POA: Diagnosis not present

## 2022-05-22 DIAGNOSIS — D509 Iron deficiency anemia, unspecified: Secondary | ICD-10-CM | POA: Diagnosis not present

## 2022-05-22 DIAGNOSIS — D649 Anemia, unspecified: Secondary | ICD-10-CM | POA: Diagnosis not present

## 2022-05-22 DIAGNOSIS — C184 Malignant neoplasm of transverse colon: Secondary | ICD-10-CM | POA: Diagnosis not present

## 2022-05-22 DIAGNOSIS — Z79899 Other long term (current) drug therapy: Secondary | ICD-10-CM | POA: Diagnosis not present

## 2022-05-22 NOTE — Patient Instructions (Signed)
[facility]  Discharge Instructions  You were seen and examined today by Dr. Delton Coombes.  Dr. Delton Coombes discussed your most recent lab work which revealed that everything looks good.  Dr. Delton Coombes wants to do some further labs. He will also get you scheduled for repeat CT abdomen and pelvis.  Follow-up as scheduled with labs and scan prior.    Thank you for choosing [facility] to provide your oncology and hematology care.   To afford each patient quality time with our provider, please arrive at least 15 minutes before your scheduled appointment time. You may need to reschedule your appointment if you arrive late (10 or more minutes). Arriving late affects you and other patients whose appointments are after yours.  Also, if you miss three or more appointments without notifying the office, you may be dismissed from the clinic at the provider's discretion.    Again, thank you for choosing [facility].  Our hope is that these requests will decrease the amount of time that you wait before being seen by our physicians.   If you have a lab appointment with the [facility] please come in thru the Main Entrance and check in at the main information desk.           _____________________________________________________________  Should you have questions after your visit to [facility], please contact our office at [phone] and follow the prompts.  Our office hours are 8:00 a.m. to 4:30 p.m. Monday - Thursday and 8:00 a.m. to 2:30 p.m. Friday.  Please note that voicemails left after 4:00 p.m. may not be returned until the following business day.  We are closed weekends and all major holidays.  You do have access to a nurse 24-7, just call the main number to the clinic [phone] and do not press any options, hold on the line and a nurse will answer the phone.    For prescription refill requests, have your  pharmacy contact our office and allow 72 hours.    Masks are optional in the cancer centers. If you would like for your care team to wear a mask while they are taking care of you, please let them know. You may have one support person who is at least 82 years old accompany you for your appointments.

## 2022-05-22 NOTE — Progress Notes (Signed)
[facility] [address] [place] [address]    Clinic Day:  05/22/2022  Referring physician: Caryl Bis, MD  Patient Care Team: Caryl Bis, MD as PCP - General (Unknown Physician Specialty) Satira Sark, MD as PCP - Cardiology (Cardiology) Derek Jack, MD as Medical Oncologist (Medical Oncology)   ASSESSMENT & PLAN:   Assessment: Colon cancer: - Colonoscopy on 10/12/2020 with 7 sessile polyps found in the transverse colon and cecum.  12 mm polyp found in the transverse colon, sessile.  Polyp was removed with piecemeal technique using cold snare.  2 sessile polyps found in the sigmoid colon, removed with cold snare. - Pathology cecal tubular adenoma, sessile serrated polyp of the transverse colon polypectomy, adenocarcinoma arising from a tubular adenoma with high-grade dysplasia of the transverse colon polypectomy.  Sigmoid colon polypectomy showed tubular adenoma, hyperplastic polyp. - Colonoscopy on 02/25/2021 with three 3 to 5 mm polyps in the transverse colon, removed with cold snare.  One 10 mm polyp in the descending colon at 54 cm proximal to the anus. - Pathology on 02/25/2021 shows invasive adenocarcinoma, poorly differentiated, grade 3 of the descending colon polypectomy.  CDX2 positive.  Other findings include transverse colon tubular adenoma and sessile serrated adenoma. - Right hemicolectomy on 05/20/2021. - Pathology shows grade 3 adenocarcinoma, mucinous features, 1.7 cm in the transverse colon, metastatic adenocarcinoma involving 2/29 lymph nodes.  No LVI/perineural invasion.  pT1, PN 1B. - Adjuvant Xeloda 1500 mg 2 weeks on/1 week off started on 07/06/2021.  Cycle 2 on 07/27/2021, 3 tablets twice daily.  Cycle 3 on 08/26/2021, 2 tablets twice daily 2 weeks on/1 week off, dose reduced secondary to HFSR.  Cycle 4 on 09/16/2021.  Cycle 5 Xeloda 1000 mg twice daily on 10/08/2021.  Completed cycle 8 on 01/30/2022.    Social/family  history: He lives at home with his wife.  He quit smoking 23 years ago.  He smoked 1 pack/day for 7 years.  He worked in [employer] prior to retirement.  He is active and walks 2 miles per day. - Father had prostate cancer.  Brother had lung cancer.  Another brother had colon cancer (? colon), Sister with lung cancer, another sister with cancer, patient unknown.  3.  Prostate cancer: - He was diagnosed with prostate cancer in August 2007, status post seed implants.  His PSA has been undetectable since then.  Plan: Stage III (T1N1B) transverse colon adenocarcinoma: - He is doing very well overall. - Reviewed labs from 05/15/2022 which showed normal LFTs.  CEA stable at 6.2. - CTAP with contrast (05/15/2022): Mild decrease in the right common and external iliac lymphadenopathy.  Stable mild right abdominal mesenteric lymphadenopathy.  No new or progressive disease in the abdomen or pelvis.  Stable 1.4 cm cystic lesion in the pancreatic body. - Recommend sending tissue and blood for [place]a.  We discussed the tumor informed testing in detail. - Recommend EGD and colonoscopy soon. - RTC 3 months for follow-up with repeat CTAP with contrast and CEA.  2.  Normocytic anemia: - Hemoglobin is 12.8.  Ferritin is 32 and percent saturation 28.    Orders Placed This Encounter  Procedures   CT Abdomen Pelvis W Contrast    Standing Status:   Future    Standing Expiration Date:   05/22/2023    Order Specific Question:   If indicated for the ordered procedure, I authorize the administration of contrast media per Radiology protocol    Answer:  Yes    Order Specific Question:   Does the patient have a contrast media/X-ray dye allergy?    Answer:   No    Order Specific Question:   Preferred imaging location?    Answer:   [facility]    Order Specific Question:   Release to patient    Answer:   Immediate [1]    Order Specific Question:   Is Oral Contrast requested for this exam?    Answer:    Yes, Per Radiology protocol   CBC with Differential/Platelet    Standing Status:   Future    Standing Expiration Date:   05/23/2023    Order Specific Question:   Release to patient    Answer:   Immediate   Comprehensive metabolic panel    Standing Status:   Future    Standing Expiration Date:   05/23/2023    Order Specific Question:   Release to patient    Answer:   Immediate   Ferritin    Standing Status:   Future    Standing Expiration Date:   05/23/2023    Order Specific Question:   Release to patient    Answer:   Immediate   Iron and TIBC    Standing Status:   Future    Standing Expiration Date:   05/23/2023    Order Specific Question:   Release to patient    Answer:   Immediate   CEA    Standing Status:   Future    Standing Expiration Date:   05/22/2023      [name] [name] as a scribe for Derek Jack, MD.,have documented all relevant documentation on the behalf of Derek Jack, MD,as directed by  Derek Jack, MD while in the presence of Derek Jack, MD.   I, Derek Jack MD, have reviewed the above documentation for accuracy and completeness, and I agree with the above.   Doyce Loose   2/26/20242:50 PM  CHIEF COMPLAINT:   Diagnosis: colon cancer    Cancer Staging  Colon cancer [facility]) Staging form: Colon and Rectum, AJCC 8th Edition - Clinical stage from 03/22/2021: Stage I (cT1, cN0, cM0) - Unsigned - Pathologic stage from 06/23/2021: Stage IIIA (pT1, pN1b, cM0) - Unsigned    Prior Therapy: None  Current Therapy:  surveillance    HISTORY OF PRESENT ILLNESS:   Oncology History   No history exists.     INTERVAL HISTORY:   [name] [name] is a 82 y.o. male presenting to clinic today for follow up of colon cancer. He was last seen by me on 03/13/2022.    Today, he states that he is doing well overall. His appetite level is at 100%. His energy level is at 100%. He recently had a sinus infections and had to take  antibiotics . He takes '150mg'$  iron pills x2 a day.     PAST MEDICAL HISTORY:   Past Medical History: Past Medical History:  Diagnosis Date   Anemia    Arthritis    CAD (coronary artery disease)    DES to circumflex 12/2017   Colon cancer ([place]) 2023   Diabetes mellitus without complication (HCC)    Dysrhythmia    GERD (gastroesophageal reflux disease)    Glaucoma    Gout    Headache    History of kidney stones    Hypertension    NSTEMI (non-ST elevated myocardial infarction) ([place]) 12/31/2017   Paroxysmal atrial fibrillation (HCC)    Pericardial effusion  Idiopathic, recurrent pericardial effusion s/p pericardial window.   Prostate cancer ([place]) 2007   S/P Seed implants    Supraventricular tachycardia    Temporal arteritis [facility])     Surgical History: Past Surgical History:  Procedure Laterality Date   BIOPSY  10/12/2020   Procedure: BIOPSY;  Surgeon: [name], MD;  [facility];  Service: Gastroenterology;;   BIOPSY  02/25/2021   Procedure: BIOPSY;  Surgeon: [name], MD;  [facility];  Service: Gastroenterology;;   BIOPSY  04/11/2021   Procedure: BIOPSY;  Surgeon: Irving Copas., MD;  Location: [facility];  Service: Gastroenterology;;   CATARACT EXTRACTION W/PHACO Left 06/07/2015   Procedure: CATARACT EXTRACTION PHACO AND INTRAOCULAR LENS PLACEMENT LEFT EYE CDE=8.00;  Surgeon: Tonny Branch, MD;  Location: [facility];  Service: Ophthalmology;  Laterality: Left;   CATARACT EXTRACTION W/PHACO Right 06/17/2015   Procedure: CATARACT EXTRACTION PHACO AND INTRAOCULAR LENS PLACEMENT RIGHT EYE CDE=11.09;  Surgeon: Tonny Branch, MD;  Location: [facility];  Service: Ophthalmology;  Laterality: Right;   COLONOSCOPY WITH PROPOFOL N/A 10/12/2020   Procedure: COLONOSCOPY WITH PROPOFOL;  Surgeon: [name], MD;  [facility];  Service: Gastroenterology;  Laterality: N/A;  10:35   COLONOSCOPY WITH PROPOFOL N/A  02/25/2021   Procedure: COLONOSCOPY WITH PROPOFOL;  Surgeon: [name], MD;  [facility];  Service: Gastroenterology;  Laterality: N/A;  8:10   CORONARY ANGIOPLASTY WITH STENT PLACEMENT  01/01/2018   CORONARY STENT INTERVENTION N/A 01/01/2018   Procedure: CORONARY STENT INTERVENTION;  Surgeon: Jettie Booze, MD;  Location: [facility];  Service: Cardiovascular;  Laterality: N/A;   ENDOSCOPIC MUCOSAL RESECTION N/A 04/11/2021   Procedure: ENDOSCOPIC MUCOSAL RESECTION;  Surgeon: Rush Landmark Telford Nab., MD;  Location: [facility];  Service: Gastroenterology;  Laterality: N/A;   ENTEROSCOPY N/A 10/12/2020   Procedure: PUSH ENTEROSCOPY;  Surgeon: [name], MD;  [facility];  Service: Gastroenterology;  Laterality: N/A;   ENTEROSCOPY N/A 04/11/2021   Procedure: ENTEROSCOPY;  Surgeon: Rush Landmark Telford Nab., MD;  Location: [facility];  Service: Gastroenterology;  Laterality: N/A;   ESOPHAGOGASTRODUODENOSCOPY (EGD) WITH PROPOFOL N/A 10/12/2020   Procedure: ESOPHAGOGASTRODUODENOSCOPY (EGD) WITH PROPOFOL;  Surgeon: [name], MD;  [facility];  Service: Gastroenterology;  Laterality: N/A;   FRACTURE SURGERY Left 2010   ankle   HEMOSTASIS CLIP PLACEMENT  04/11/2021   Procedure: HEMOSTASIS CLIP PLACEMENT;  Surgeon: Irving Copas., MD;  [facility]: [facility];  Service: Gastroenterology;;   INSERTION PROSTATE RADIATION SEED  2007   KNEE ARTHROSCOPY Left    LAPAROSCOPY N/A 05/22/2021   Procedure: LAPAROSCOPY DIAGNOSTIC WITH WASHOUT OF ABDOMEN;  Surgeon: Leighton Ruff, MD;  Location: [facility];  Service: General;  Laterality: N/A;   LEFT HEART CATH AND CORONARY ANGIOGRAPHY N/A 01/01/2018   Procedure: LEFT HEART CATH AND CORONARY ANGIOGRAPHY;  Surgeon: Jettie Booze, MD;  Location: [facility];  Service: Cardiovascular;  Laterality: N/A;   ORIF TIBIA & FIBULA FRACTURES Left 2003   Distal  tibial/fibula    PERICARDIAL WINDOW  02/2007   PERICARDIOCENTESIS  2007   hx/notes 10/19/2011   POLYPECTOMY  10/12/2020   Procedure: POLYPECTOMY;  Surgeon: [name], MD;  [facility];  Service: Gastroenterology;;  small bowel, cecal   POLYPECTOMY  02/25/2021   Procedure: POLYPECTOMY;  Surgeon: [name], MD;  [facility];  Service: Gastroenterology;;  transverse colon x2   SUBMUCOSAL  LIFTING INJECTION  04/11/2021   Procedure: SUBMUCOSAL LIFTING INJECTION;  Surgeon: Rush Landmark Telford Nab., MD;  Location: [facility];  Service: Gastroenterology;;   SUBMUCOSAL TATTOO INJECTION  04/11/2021   Procedure: SUBMUCOSAL TATTOO INJECTION;  Surgeon: Irving Copas., MD;  Location: [facility];  Service: Gastroenterology;;    Social History: Social History   Socioeconomic History   Marital status: Married    Spouse name: Not on file   Number of children: Not on file   Years of education: Not on file   Highest education level: Not on file  Occupational History   Not on file  Tobacco Use   Smoking status: Former    Packs/day: 0.30    Years: 10.00    Total pack years: 3.00    Types: Cigarettes    Start date: 08/06/1958    Quit date: 1968    Years since quitting: 56.1   Smokeless tobacco: Never  Vaping Use   Vaping Use: Never used  Substance and Sexual Activity   Alcohol use: Not Currently   Drug use: Never   Sexual activity: Yes  Other Topics Concern   Not on file  Social History Narrative   Not on file   Social Determinants of Health   Financial Resource Strain: Not on file  Food Insecurity: Not on file  Transportation Needs: Not on file  Physical Activity: Not on file  Stress: Not on file  Social Connections: Not on file  Intimate Partner Violence: Not on file    Family History: Family History  Problem Relation Age of Onset   CAD Mother        MI in 62s   Prostate cancer Father    Prostate cancer Brother     Lung cancer Brother     Current Medications:  Current Outpatient Medications:    acetaminophen (TYLENOL) 325 MG tablet, Take 650 mg by mouth as needed (pain or fever)., Disp: , Rfl:    allopurinol (ZYLOPRIM) 300 MG tablet, Take 450 mg by mouth daily., Disp: , Rfl:    apixaban (ELIQUIS) 5 MG TABS tablet, Take 1 tablet (5 mg total) by mouth 2 (two) times daily., Disp: 60 tablet, Rfl: 6   Ascorbic Acid (VITAMIN C) 1000 MG tablet, Take 1,000 mg by mouth 2 (two) times daily., Disp: , Rfl:    atorvastatin (LIPITOR) 80 MG tablet, TAKE 1 TABLET BY MOUTH ONCE DAILY AT  6  PM, Disp: 90 tablet, Rfl: 2   benazepril (LOTENSIN) 40 MG tablet, TAKE 1 TABLET BY MOUTH ONCE DAILY *NEEDS  OFFICE  VISIT*, Disp: 90 tablet, Rfl: 2   capecitabine (XELODA) 500 MG tablet, Take 2 tablets (1,000 mg total) by mouth 2 (two) times daily after a meal. Take for 14 days, then hold for 7 days. Repeat every 21 days, Disp: 56 tablet, Rfl: 1   cyanocobalamin 1000 MCG tablet, Take 1 tablet (1,000 mcg total) by mouth daily., Disp: , Rfl:    diltiazem (CARDIZEM CD) 240 MG 24 hr capsule, Take 240 mg by mouth daily., Disp: , Rfl:    dorzolamide (TRUSOPT) 2 % ophthalmic solution, Place 1 drop into both eyes 2 (two) times daily., Disp: , Rfl:    FERREX 150 150 MG capsule, Take 150 mg by mouth 2 (two) times daily., Disp: , Rfl:    hydrALAZINE (APRESOLINE) 100 MG tablet, Take 100 mg by mouth 2 (two) times daily., Disp: , Rfl:    latanoprost (XALATAN) 0.005 % ophthalmic solution, Place 1 drop into both  eyes at bedtime., Disp: , Rfl:    loperamide (IMODIUM) 2 MG capsule, Take 2 mg by mouth as needed for diarrhea or loose stools., Disp: , Rfl:    metFORMIN (GLUCOPHAGE) 500 MG tablet, Take 500 mg by mouth 2 (two) times daily., Disp: , Rfl:    metoprolol succinate (TOPROL-XL) 50 MG 24 hr tablet, Take 50 mg by mouth daily. Take with or immediately following a meal., Disp: , Rfl:    Multiple Vitamin (MULTIVITAMIN) tablet, Take 1 tablet by mouth in  the morning., Disp: , Rfl:    ONETOUCH ULTRA test strip, USE 1 STRIP TO CHECK GLUCOSE ONCE DAILY, Disp: , Rfl:    pantoprazole (PROTONIX) 40 MG tablet, Take 1 tablet by mouth once daily, Disp: 90 tablet, Rfl: 0   prochlorperazine (COMPAZINE) 10 MG tablet, Take 1 tablet (10 mg total) by mouth every 6 (six) hours as needed for nausea or vomiting., Disp: 60 tablet, Rfl: 3   SSD 1 % cream, Apply 1 Application topically as needed (Wound)., Disp: , Rfl:  No current facility-administered medications for this visit.  Facility-Administered Medications Ordered in Other Visits:    lactated ringers infusion, , Intravenous, Continuous PRN, Claudia Desanctis, CRNA, Stopped at 05/11/22 1355   Allergies: Allergies  Allergen Reactions   Amiodarone Rash    REVIEW OF SYSTEMS:   Review of Systems  Constitutional:  Negative for chills, fatigue and fever.  HENT:   Negative for lump/mass, mouth sores, nosebleeds, sore throat and trouble swallowing.   Eyes:  Negative for eye problems.  Respiratory:  Positive for shortness of breath. Negative for cough.   Cardiovascular:  Negative for chest pain, leg swelling and palpitations.  Gastrointestinal:  Positive for diarrhea. Negative for abdominal pain, constipation, nausea, rectal pain and vomiting.  Genitourinary:  Negative for bladder incontinence, difficulty urinating, dysuria, frequency, hematuria and nocturia.   Musculoskeletal:  Negative for arthralgias, back pain, flank pain, myalgias and neck pain.  Skin:  Negative for itching and rash.  Neurological:  Positive for headaches. Negative for dizziness and numbness.  Hematological:  Does not bruise/bleed easily.  Psychiatric/Behavioral:  Negative for depression, sleep disturbance and suicidal ideas. The patient is not nervous/anxious.   All other systems reviewed and are negative.    VITALS:   Blood pressure 138/60, pulse (!) 55, temperature 97.8 F (36.6 C), temperature source Oral, resp. rate 18,  weight 214 lb 12.8 oz (97.4 kg), SpO2 99 %.  Wt Readings from Last 3 Encounters:  05/22/22 214 lb 12.8 oz (97.4 kg)  05/02/22 207 lb 14.3 oz (94.3 kg)  03/13/22 207 lb 12.8 oz (94.3 kg)    Body mass index is 29.96 kg/m.  Performance status (ECOG): 1 - Symptomatic but completely ambulatory  PHYSICAL EXAM:   Physical Exam Vitals and nursing note reviewed. Exam conducted with a chaperone present.  Constitutional:      Appearance: Normal appearance.  Cardiovascular:     Rate and Rhythm: Normal rate and regular rhythm.     Pulses: Normal pulses.     Heart sounds: Normal heart sounds.  Pulmonary:     Effort: Pulmonary effort is normal.     Breath sounds: Normal breath sounds.  Abdominal:     Palpations: Abdomen is soft. There is no hepatomegaly, splenomegaly or mass.     Tenderness: There is no abdominal tenderness.  Musculoskeletal:     Right lower leg: No edema.     Left lower leg: No edema.  Lymphadenopathy:  Cervical: No cervical adenopathy.     Right cervical: No superficial, deep or posterior cervical adenopathy.    Left cervical: No superficial, deep or posterior cervical adenopathy.     Upper Body:     Right upper body: No supraclavicular or axillary adenopathy.     Left upper body: No supraclavicular or axillary adenopathy.  Neurological:     General: No focal deficit present.     Mental Status: He is alert and oriented to person, place, and time.  Psychiatric:        Mood and Affect: Mood normal.        Behavior: Behavior normal.     LABS:      Latest Ref Rng & Units 05/15/2022   11:55 AM 03/07/2022   11:24 AM 01/30/2022   11:45 AM  CBC  WBC 4.0 - 10.5 K/uL 6.1  8.9  6.7   Hemoglobin 13.0 - 17.0 g/dL 12.8  11.2  11.2   Hematocrit 39.0 - 52.0 % 40.4  35.2  33.8   Platelets 150 - 400 K/uL 159  292  205       Latest Ref Rng & Units 05/15/2022   11:55 AM 03/07/2022   11:24 AM 01/30/2022   11:45 AM  CMP  Glucose 70 - 99 mg/dL 129  165  179   BUN 8 - 23  mg/dL '13  14  20   '$ Creatinine 0.61 - 1.24 mg/dL 0.71  0.82  0.90   Sodium 135 - 145 mmol/L 138  137  135   Potassium 3.5 - 5.1 mmol/L 3.7  4.3  4.3   Chloride 98 - 111 mmol/L 104  103  104   CO2 22 - 32 mmol/L '27  24  25   '$ Calcium 8.9 - 10.3 mg/dL 9.4  9.6  9.4   Total Protein 6.5 - 8.1 g/dL 7.1  7.2  6.5   Total Bilirubin 0.3 - 1.2 mg/dL 0.6  0.2  1.0   Alkaline Phos 38 - 126 U/L 79  71  80   AST 15 - 41 U/L '29  19  21   '$ ALT 0 - 44 U/L '25  18  19      '$ Lab Results  Component Value Date   CEA1 6.2 (H) 05/15/2022   CEA 1.8 03/07/2007   /  CEA  Date Value Ref Range Status  05/15/2022 6.2 (H) 0.0 - 4.7 ng/mL Final    Comment:    (NOTE)                             Nonsmokers          <3.9                             Smokers             <5.6 [employer] Electrochemiluminescence Immunoassay (ECLIA) Values obtained with different assay methods or kits cannot be used interchangeably.  Results cannot be interpreted as absolute evidence of the presence or absence of malignant disease. Performed At: [facility] [place], [place] [id number] Rush Farmer MD [id number]   03/07/2007 1.8  Final   No results found for: "PSA1" No results found for: "[id number]" No results found for: "[id number]"  Lab Results  Component Value Date   TOTALPROTELP 6.3 03/08/2021   ALBUMINELP 3.8 03/08/2021   A1GS 0.2 03/08/2021  A2GS 0.7 03/08/2021   BETS 0.8 03/08/2021   GAMS 0.8 03/08/2021   MSPIKE Not Observed 03/08/2021   SPEI Comment 03/08/2021   Lab Results  Component Value Date   TIBC 322 05/15/2022   TIBC 314 01/30/2022   TIBC 305 06/20/2021   FERRITIN 32 05/15/2022   FERRITIN 84 01/30/2022   FERRITIN 78 06/20/2021   IRONPCTSAT 28 05/15/2022   IRONPCTSAT 17 (L) 01/30/2022   IRONPCTSAT 21 06/20/2021   Lab Results  Component Value Date   LDH 169 06/19/2007     STUDIES:   CT Abdomen Pelvis W Contrast  Result Date: 05/15/2022 CLINICAL DATA:   Follow-up metastatic colon carcinoma. * Tracking Code: BO * EXAM: CT ABDOMEN AND PELVIS WITH CONTRAST TECHNIQUE: Multidetector CT imaging of the abdomen and pelvis was performed using the standard protocol following bolus administration of intravenous contrast. RADIATION DOSE REDUCTION: This exam was performed according to the departmental dose-optimization program which includes automated exposure control, adjustment of the mA and/or kV according to patient size and/or use of iterative reconstruction technique. CONTRAST:  142m OMNIPAQUE IOHEXOL 300 MG/ML  SOLN COMPARISON:  03/07/2022 FINDINGS: Lower Chest: No acute findings. Previously seen tiny left lower lobe pulmonary nodule is not visualized on this exam. Stable small pericardial effusion. Hepatobiliary: Multiple small benign hepatic cysts remains stable. No suspicious hepatic masses identified. Gallbladder is unremarkable. No evidence of biliary ductal dilatation. Pancreas: Stable 1.4 cm cystic lesion in the pancreatic body. No evidence of pancreatic ductal dilatation or peripancreatic inflammatory changes. Spleen: Within normal limits in size and appearance. Adrenals/Urinary Tract: No suspicious masses identified. 2-3 mm nonobstructing left renal calculus again noted. No evidence of ureteral calculi or hydronephrosis. Stomach/Bowel: Prior right hemicolectomy. No soft tissue mass identified. No evidence of obstruction, inflammatory process or abnormal fluid collections. Vascular/Lymphatic: Mild right abdominal mesenteric lymphadenopathy shows no significant change, with largest lymph node measuring 11 mm in short axis on image 54/2. Right common and external iliac lymphadenopathy has decreased since previous study, with index lymph node currently measuring 11 mm on image 59/2, compared to 15 mm previously. No new sites of lymphadenopathy identified. Aortic atherosclerotic calcification incidentally noted. No acute vascular findings. Reproductive:  Prostate  brachytherapy seeds again seen. Other: Stable small bilateral inguinal hernias, which contain only fat. Musculoskeletal:  No suspicious bone lesions identified. IMPRESSION: Mild decrease in right common and external iliac lymphadenopathy. Stable mild right abdominal mesenteric lymphadenopathy. No new or progressive disease within the abdomen or pelvis. Stable 1.4 cm simple appearing cystic lesion in pancreatic body. Recommend continued attention on follow-up imaging. Aortic Atherosclerosis (ICD10-I70.0). Electronically Signed   By: JMarlaine HindM.D.   On: 05/15/2022 16:50

## 2022-05-23 ENCOUNTER — Other Ambulatory Visit: Payer: Self-pay

## 2022-05-23 DIAGNOSIS — C184 Malignant neoplasm of transverse colon: Secondary | ICD-10-CM

## 2022-05-23 NOTE — Progress Notes (Unsigned)
    Cardiology Office Note  Date: 05/24/2022   ID: MCKINLEY CLINTON, DOB 01/26/1941, MRN [id number]  History of Present Illness: [name] [name] is an 82 y.o. male last seen in August 2023.  He is here today with his wife for a follow-up visit.  He does not report any angina, stable NYHA class II dyspnea.  Also no progressive sense of palpitations, no dizziness or syncope.  He has had trouble with increasing blood pressures despite multimodal therapy and compliance with his medications.  His wife helps him keep track of things.  I did review his home blood pressure checks.  I personally reviewed his ECG today which shows sinus rhythm with poor R wave progression, rule out old anterior infarct pattern.  Lab work recently per PCP, LDL very well-controlled at 48, renal function normal.  I reviewed his medications today and we discussed increasing his hydralazine.  Neck step would be potentially addition of Aldactone.  Physical Exam: VS:  BP (!) 168/71   Pulse (!) 56   Ht 5' 11"$  (1.803 m)   Wt 213 lb 12.8 oz (97 kg)   SpO2 98%   BMI 29.82 kg/m , BMI Body mass index is 29.82 kg/m.  Wt Readings from Last 3 Encounters:  05/24/22 213 lb 12.8 oz (97 kg)  05/22/22 214 lb 12.8 oz (97.4 kg)  05/02/22 207 lb 14.3 oz (94.3 kg)    General: Patient appears comfortable at rest. HEENT: Conjunctiva and lids normal. Neck: Supple, no elevated JVP or carotid bruits. Lungs: Clear to auscultation, nonlabored breathing at rest. Cardiac: Regular rate and rhythm, no S3, 2/6 systolic murmur. Extremities: No pitting edema.  ECG:  An ECG dated 05/21/2021 was personally reviewed today and demonstrated:  Sinus rhythm with low voltage, rule out old inferior infarct pattern.  Labwork: 03/07/2022: Magnesium 1.9 05/15/2022: ALT 25; AST 29; BUN 13; Creatinine, Ser 0.71; Hemoglobin 12.8; Platelets 159; Potassium 3.7; Sodium 138  November 2023: Cholesterol 111, triglycerides 58, HDL 50, LDL 48  Other Studies  Reviewed Today:  No interval cardiac testing for review.  Assessment and Plan:  1.  Paroxysmal atrial fibrillation with CHA2DS2-VASc score of 3.  He remains on Eliquis for stroke prophylaxis.  Recent lab work is reviewed above.  Heart rate is regular today and reports no increasing palpitations.  I reviewed his recent lab work.  Plan to continue anticoagulation along with Cardizem CD and Toprol-XL.  2.  CAD status post DES to the circumflex in 2019.  He does not describe any angina at this time on medical therapy.  Continue statin therapy.  He is not on aspirin given use of Eliquis.  3.  Mixed hyperlipidemia on Lipitor.  Recent LDL 48 in November 2023.  No changes were made today.  4.  Severe concentric LVH with LVEF 70 to 75% and resting LVOT gradient of 36 mmHg.  Last echocardiogram was in February 2023.  5.  Hypertension on multimodal therapy.  Blood pressure control is not optimal.  Increase hydralazine to include a 50 mg dose in the middle of the day, otherwise 100 mg twice daily.  No change in Lotensin, Cardizem CD which was already increased to 240 mg daily by PCP, and Toprol-XL.  May need to consider addition of Aldactone next.  Disposition:  Follow up  6 months.  Signed, Satira Sark, M.D., F.A.C.C.

## 2022-05-24 ENCOUNTER — Telehealth (INDEPENDENT_AMBULATORY_CARE_PROVIDER_SITE_OTHER): Payer: Self-pay | Admitting: *Deleted

## 2022-05-24 ENCOUNTER — Encounter: Payer: Self-pay | Admitting: Cardiology

## 2022-05-24 ENCOUNTER — Telehealth (INDEPENDENT_AMBULATORY_CARE_PROVIDER_SITE_OTHER): Payer: Self-pay | Admitting: Gastroenterology

## 2022-05-24 ENCOUNTER — Ambulatory Visit: Payer: Medicare HMO | Attending: Cardiology | Admitting: Cardiology

## 2022-05-24 VITALS — BP 168/71 | HR 56 | Ht 71.0 in | Wt 213.8 lb

## 2022-05-24 DIAGNOSIS — I480 Paroxysmal atrial fibrillation: Secondary | ICD-10-CM

## 2022-05-24 DIAGNOSIS — I25119 Atherosclerotic heart disease of native coronary artery with unspecified angina pectoris: Secondary | ICD-10-CM | POA: Diagnosis not present

## 2022-05-24 DIAGNOSIS — I10 Essential (primary) hypertension: Secondary | ICD-10-CM

## 2022-05-24 DIAGNOSIS — E782 Mixed hyperlipidemia: Secondary | ICD-10-CM

## 2022-05-24 MED ORDER — HYDRALAZINE HCL 100 MG PO TABS: ORAL_TABLET | ORAL | 2 refills | Status: DC

## 2022-05-24 NOTE — Telephone Encounter (Signed)
-----   Message from [name], MD sent at 05/11/2022  3:42 PM EST ----- Yes, thank you! ----- Message ----- From: Worthy Keeler Sent: 05/11/2022   3:37 PM EST To: Madelin Rear, LPN; Cheron Every, CMA; #  Ok, I will give him a call tomorrow once I'm back in our office and let him know we will send the [place], if that's ok. Thanks ----- Message ----- From: [name], MD Sent: 05/11/2022   3:27 PM EST To: Madelin Rear, LPN; Cheron Every, CMA; #  I think maybe calling him and letting him know we will send the questionnaire as he is due for his repeat colonoscopy ----- Message ----- From: Worthy Keeler Sent: 05/11/2022   3:15 PM EST To: Madelin Rear, LPN; Cheron Every, CMA; #  I don't see where patient is on recall for repeat TCS. Do you want me to see him a questionnaire or have one of the schedulers call and schedule him? ----- Message ----- From: [name], MD Sent: 05/11/2022   2:49 PM EST To: Madelin Rear, LPN; Cheron Every, CMA; #  Hi, This patient is supposed to be due for repeat colonoscopy for surveillance of colon cancer. Do not know if he received a questionnaire for surveillance. Can you please check this with the patient? If he is open to proceed with it , we can schedule him for repeat colonoscopy in room 3, will need to obtain clearance to hold Eliquis.  Thanks  Maylon Peppers, MD Gastroenterology and Hepatology [facility] Gastroenterology

## 2022-05-24 NOTE — Telephone Encounter (Signed)
Yes, please get clearance Thanks

## 2022-05-24 NOTE — Telephone Encounter (Signed)
    05/24/22  [name] 1940/11/15  What type of surgery is being performed? Colonoscopy with Propofol   When is surgery scheduled? TBD  What type of clearance is required (medical or pharmacy to hold medication or both? Medication  Are there any medications that need to be held prior to surgery and how long? Eliquis x 2 days prior   Name of physician performing surgery?  Dr. Maylon Peppers [facility] Gastroenterology at [facility] Phone: [phone] Fax: [phone]  Anethesia type (none, local, MAC, general)? MAC

## 2022-05-24 NOTE — Telephone Encounter (Signed)
Who is your primary care physician: Dr.Terry [name], MD  Reasons for the colonoscopy: Recall  Have you had a colonoscopy before?  Yes 10/06/20 and 02/25/21  Do you have family history of colon cancer? Yes, Brother  Previous colonoscopy with polyps removed? Yes 02/25/21  Do you have a history colorectal cancer?  Yes Colon Cancer/Colon Surgery 05/20/21  Are you diabetic? If yes, Type 1 or Type 2?    Yes  Do you have a prosthetic or mechanical heart valve? No  Do you have a pacemaker/defibrillator?   No  Have you had endocarditis/atrial fibrillation? No  Have you had joint replacement within the last 12 months?  No  Do you tend to be constipated or have to use laxatives? No  Do you have any history of drugs or alchohol?  No  Do you use supplemental oxygen?  No  Have you had a stroke or heart attack within the last 6 months? No (heart attack 12/31/17)     Do you take any blood-thinning medications such as: (aspirin, warfarin, Plavix, Aggrenox)  Yes  If yes we need the name, milligram, dosage and who is prescribing doctor Eliquis 5 mg tablet one table twice daily-Dr.Samuel Medowell/Dr.Terry [name]  Current Outpatient Medications on File Prior to Visit  Medication Sig Dispense Refill   acetaminophen (TYLENOL) 325 MG tablet Take 650 mg by mouth as needed (pain or fever).     allopurinol (ZYLOPRIM) 300 MG tablet Take 450 mg by mouth daily.     apixaban (ELIQUIS) 5 MG TABS tablet Take 1 tablet (5 mg total) by mouth 2 (two) times daily. 60 tablet 6   Ascorbic Acid (VITAMIN C) 1000 MG tablet Take 1,000 mg by mouth 2 (two) times daily.     atorvastatin (LIPITOR) 80 MG tablet TAKE 1 TABLET BY MOUTH ONCE DAILY AT  6  PM 90 tablet 2   benazepril (LOTENSIN) 40 MG tablet TAKE 1 TABLET BY MOUTH ONCE DAILY *NEEDS  OFFICE  VISIT* 90 tablet 2   cyanocobalamin 1000 MCG tablet Take 1 tablet (1,000 mcg total) by mouth daily.     diltiazem (CARDIZEM CD) 240 MG 24 hr capsule Take 240 mg by mouth daily.      dorzolamide (TRUSOPT) 2 % ophthalmic solution Place 1 drop into both eyes 2 (two) times daily.     FERREX 150 150 MG capsule Take 150 mg by mouth 2 (two) times daily.     hydrALAZINE (APRESOLINE) 100 MG tablet Take 100 mg by mouth 2 (two) times daily.     latanoprost (XALATAN) 0.005 % ophthalmic solution Place 1 drop into both eyes at bedtime.     metFORMIN (GLUCOPHAGE) 500 MG tablet Take 500 mg by mouth 2 (two) times daily.     metoprolol succinate (TOPROL-XL) 50 MG 24 hr tablet Take 50 mg by mouth daily. Take with or immediately following a meal.     Multiple Vitamin (MULTIVITAMIN) tablet Take 1 tablet by mouth in the morning.     ONETOUCH ULTRA test strip USE 1 STRIP TO CHECK GLUCOSE ONCE DAILY     pantoprazole (PROTONIX) 40 MG tablet Take 1 tablet by mouth once daily 90 tablet 0   prochlorperazine (COMPAZINE) 10 MG tablet Take 1 tablet (10 mg total) by mouth every 6 (six) hours as needed for nausea or vomiting. 60 tablet 3   capecitabine (XELODA) 500 MG tablet Take 2 tablets (1,000 mg total) by mouth 2 (two) times daily after a meal. Take for 14 days, then  hold for 7 days. Repeat every 21 days (Patient not taking: Reported on 05/24/2022) 56 tablet 1   loperamide (IMODIUM) 2 MG capsule Take 2 mg by mouth as needed for diarrhea or loose stools. (Patient not taking: Reported on 05/24/2022)     SSD 1 % cream Apply 1 Application topically as needed (Wound). (Patient not taking: Reported on 05/24/2022)     Current Facility-Administered Medications on File Prior to Visit  Medication Dose Route Frequency Provider Last Rate Last Admin   lactated ringers infusion   Intravenous Continuous PRN Claudia Desanctis, CRNA   Stopped at 05/11/22 1355    Allergies  Allergen Reactions   Amiodarone Rash     [facility]   Primary Insurance Name: Bernadene Person  Best number where you can be reached: [phone]

## 2022-05-24 NOTE — Telephone Encounter (Signed)
Pt on Eliquis BID-does he need clearance or can Eliquis be held without clearance. Please advise. Thank you!

## 2022-05-24 NOTE — Patient Instructions (Addendum)
Medication Instructions:  Your physician has recommended you make the following change in your medication:  Take hydralazine 100 mg in the morning and at bedtime. Take 50 mg mid-day  Continue other medications the same  Labwork: none  Testing/Procedures: none  Follow-Up: Your physician recommends that you schedule a follow-up appointment in: 6 months  Any Other Special Instructions Will Be Listed Below (If Applicable).  If you need a refill on your cardiac medications before your next appointment, please call your pharmacy.

## 2022-05-24 NOTE — Telephone Encounter (Signed)
noted 

## 2022-05-24 NOTE — Telephone Encounter (Signed)
Room 3 Thanks

## 2022-05-25 NOTE — Telephone Encounter (Signed)
Clearance was sent 05/24/22

## 2022-05-26 NOTE — Telephone Encounter (Signed)
   Patient Name: [name] [name]  DOB: [birth year] MRN: [id number]  Primary Cardiologist: Rozann Lesches, MD  Chart reviewed as part of pre-operative protocol coverage. Pre-op clearance already addressed by colleagues in earlier phone notes. To summarize recommendations:  -Per office protocol, patient can hold Eliquis for 2 days prior to procedure.   Patient will not need bridging with Lovenox (enoxaparin) around procedure.  Medical clearance was not requested.  Will route this bundled recommendation to requesting provider via Epic fax function and remove from pre-op pool. Please call with questions.  Elgie Collard, PA-C 05/26/2022, 11:17 AM

## 2022-05-26 NOTE — Telephone Encounter (Signed)
Patient with diagnosis of atrial fibrillation on Eliquis for anticoagulation.    Procedure: colonoscopy w/propofol Date of procedure: TBD   CHA2DS2-VASc Score = 5   This indicates a 7.2% annual risk of stroke. The patient's score is based upon: CHF History: 0 HTN History: 1 Diabetes History: 1 Stroke History: 0 Vascular Disease History: 1 Age Score: 2 Gender Score: 0    CrCl 112 Platelet count 159  Per office protocol, patient can hold Eliquis for 2 days prior to procedure.   Patient will not need bridging with Lovenox (enoxaparin) around procedure.  **This guidance is not considered finalized until pre-operative APP has relayed final recommendations.**

## 2022-05-30 NOTE — Telephone Encounter (Signed)
Received clearance for pt to hold eliquis x 2 days prior

## 2022-05-30 NOTE — Telephone Encounter (Signed)
Left message to return call to schedule TCS Room 3. . Metformin-1/2 dose night before, none day of. Hold iron x 7 days, hold Eliquis 2 days prior.

## 2022-06-01 ENCOUNTER — Telehealth: Payer: Self-pay | Admitting: Cardiology

## 2022-06-01 MED ORDER — PEG 3350-KCL-NA BICARB-NACL 420 G PO SOLR: 4000.0000 mL | Freq: Once | ORAL | 0 refills | Status: AC

## 2022-06-01 NOTE — Addendum Note (Signed)
Addended by: Vicente Males on: 06/01/2022 10:52 AM   Modules accepted: Orders

## 2022-06-01 NOTE — Telephone Encounter (Signed)
Contacted pt and scheduled TCS ASA 3 for 07/04/22. Instructions will be mailed to patient. Prep sent to pharmacy. Will call with pre op appt.

## 2022-06-01 NOTE — Telephone Encounter (Signed)
Pt c/o BP issue: STAT if pt c/o blurred vision, one-sided weakness or slurred speech  1. What are your last 5 BP readings?  170/69 167/74 159/69 174/79 151/65  2. Are you having any other symptoms (ex. Dizziness, headache, blurred vision, passed out)? No symptoms  3. What is your BP issue? Wife states his hydralazine was recently increased to take an additional '50mg'$  a day (an additional 1/2 a tablet in the middle of the day), but she states she still can't get his BP down.  In the past week the lowest it's been is 151/65.  She wants to know if something else can be done, or if the half a tablet in the middle of the day of hydralazine and be increased to a whole tablet.

## 2022-06-01 NOTE — Telephone Encounter (Signed)
Pt wife reported pt blood pressure has been elevated. They are working with cardiologist and PCP on getting it down.

## 2022-06-01 NOTE — Telephone Encounter (Signed)
Reports checking home BP with same arm & same cuff. Reports BP's were taken at random times, and taken immediately after sitting. Advised to starting checking BP's consistently, same time (1-2 hours after medications), same arm, same cuff, and waiting 5-10 minutes after sitting.Advised to contact office in 1 week with readings. Verbalized understanding.

## 2022-06-05 DIAGNOSIS — H401121 Primary open-angle glaucoma, left eye, mild stage: Secondary | ICD-10-CM | POA: Diagnosis not present

## 2022-06-05 NOTE — Telephone Encounter (Signed)
Pt and pt wife aware of pre op appt in person at [facility] 06/30/22 at 11:30am

## 2022-06-09 ENCOUNTER — Telehealth: Payer: Self-pay | Admitting: Cardiology

## 2022-06-09 ENCOUNTER — Other Ambulatory Visit: Payer: Self-pay | Admitting: *Deleted

## 2022-06-09 DIAGNOSIS — Z79899 Other long term (current) drug therapy: Secondary | ICD-10-CM

## 2022-06-09 DIAGNOSIS — I10 Essential (primary) hypertension: Secondary | ICD-10-CM

## 2022-06-09 MED ORDER — SPIRONOLACTONE 25 MG PO TABS: 12.5000 mg | ORAL_TABLET | Freq: Every day | ORAL | 6 refills | Status: DC

## 2022-06-09 NOTE — Telephone Encounter (Signed)
Pt c/o medication issue:  1. Name of Medication:  hydrALAZINE (APRESOLINE) 100 MG tablet  2. How are you currently taking this medication (dosage and times per day)?  As prescribed  3. Are you having a reaction (difficulty breathing--STAT)?   4. What is your medication issue?   Patient's wife states added 50 MG midday has not helped with BP. The lowest reading was 124/60 on Monday, but most days it was still elevated. Today before medication it was 170/79 54 and after medication it was 175/75 56.

## 2022-06-09 NOTE — Telephone Encounter (Signed)
Wife Barnett Applebaum) notified & verbalized understanding.  Will send new prescription to [facility] now.  Will mail lab order to home - he will do at [facility].

## 2022-06-13 ENCOUNTER — Other Ambulatory Visit: Payer: Self-pay | Admitting: Cardiology

## 2022-06-19 ENCOUNTER — Other Ambulatory Visit (HOSPITAL_COMMUNITY)
Admission: RE | Admit: 2022-06-19 | Discharge: 2022-06-19 | Disposition: A | Discharge status: Home / Self Care | Payer: Medicare HMO | Source: Ambulatory Visit | Attending: Cardiology | Admitting: Cardiology

## 2022-06-19 ENCOUNTER — Encounter (HOSPITAL_COMMUNITY): Payer: Self-pay | Admitting: Gastroenterology

## 2022-06-19 ENCOUNTER — Telehealth: Payer: Self-pay | Admitting: Gastroenterology

## 2022-06-19 DIAGNOSIS — C19 Malignant neoplasm of rectosigmoid junction: Secondary | ICD-10-CM

## 2022-06-19 DIAGNOSIS — I10 Essential (primary) hypertension: Secondary | ICD-10-CM | POA: Insufficient documentation

## 2022-06-19 LAB — BASIC METABOLIC PANEL
Anion gap: 8 (ref 5–15)
BUN: 16 mg/dL (ref 8–23)
CO2: 23 mmol/L (ref 22–32)
Calcium: 9.3 mg/dL (ref 8.9–10.3)
Chloride: 105 mmol/L (ref 98–111)
Creatinine, Ser: 0.87 mg/dL (ref 0.61–1.24)
GFR, Estimated: >60 mL/min (ref >60)
Glucose, Bld: 194 mg/dL — ABNORMAL HIGH (ref 70–99)
Potassium: 4.3 mmol/L (ref 3.5–5.1)
Sodium: 136 mmol/L (ref 135–145)

## 2022-06-19 NOTE — Telephone Encounter (Signed)
Baird Cancer from Pre Op at [facility] called to see if this patient can have his colonoscopy schedule 4/9 done at the same time as his Enterocopy on 4/1. Her call back number is [phone] and she can also be messaged on Epic.

## 2022-06-19 NOTE — Progress Notes (Signed)
Attempted to obtain medical history via telephone, unable to reach at this time. HIPAA compliant voicemail message left requesting return call to pre surgical testing department. 

## 2022-06-20 ENCOUNTER — Telehealth (INDEPENDENT_AMBULATORY_CARE_PROVIDER_SITE_OTHER): Payer: Self-pay | Admitting: Gastroenterology

## 2022-06-20 NOTE — Telephone Encounter (Signed)
FYI- Dr.Mansouraty   Returned call to patient and his spouse. They would like for both procedures to be scheduled at the same time due to patient being on blood thinners and his age. They have some concerns about him being put to sleep multiple times. I informed them that we could schedule patient for both procedure, but we would have to move the date to a different date. They agreed and would like to have both Colonoscopy +Enteroscopy done with Dr. Rush Landmark. I will reschedule patients procedure to 07/24/22 at [facility].Wife states that they have already picked up prescription for prep. Updated instructions will be sent to patient.

## 2022-06-20 NOTE — Telephone Encounter (Signed)
FYI-Pt wife called in and has cancelled Colonoscopy for 07/04/22. Pt is having colonoscopy and enteroscopy on 07/24/22 with Dr.Mansouraty

## 2022-06-20 NOTE — Telephone Encounter (Signed)
Sounds good, this makes perfect sense Thanks

## 2022-06-21 NOTE — Telephone Encounter (Signed)
Thank you for this update.  I am okay with doing the double procedure to minimize anesthesia for him as able. GM

## 2022-06-29 ENCOUNTER — Encounter (HOSPITAL_COMMUNITY): Payer: Self-pay

## 2022-06-30 ENCOUNTER — Encounter (HOSPITAL_COMMUNITY): Payer: Medicare HMO

## 2022-07-04 ENCOUNTER — Ambulatory Visit (HOSPITAL_COMMUNITY): Admit: 2022-07-04 | Payer: Medicare HMO | Admitting: Gastroenterology

## 2022-07-04 ENCOUNTER — Encounter (HOSPITAL_COMMUNITY): Payer: Self-pay

## 2022-07-04 SURGERY — COLONOSCOPY WITH PROPOFOL
Anesthesia: Monitor Anesthesia Care

## 2022-07-17 ENCOUNTER — Encounter (HOSPITAL_COMMUNITY): Payer: Self-pay | Admitting: Gastroenterology

## 2022-07-17 NOTE — Progress Notes (Signed)
Attempted to obtain medical history via telephone, unable to reach at this time. HIPAA compliant voicemail message left requesting return call to pre surgical testing department. 

## 2022-07-24 ENCOUNTER — Encounter (HOSPITAL_COMMUNITY): Payer: Self-pay | Admitting: Gastroenterology

## 2022-07-24 ENCOUNTER — Encounter (HOSPITAL_COMMUNITY)
Admission: RE | Disposition: A | Discharge status: Home / Self Care | Payer: Self-pay | Source: Home / Self Care | Attending: Gastroenterology

## 2022-07-24 ENCOUNTER — Other Ambulatory Visit: Payer: Self-pay

## 2022-07-24 ENCOUNTER — Ambulatory Visit (HOSPITAL_COMMUNITY): Payer: Medicare HMO | Admitting: Certified Registered Nurse Anesthetist

## 2022-07-24 ENCOUNTER — Ambulatory Visit (HOSPITAL_BASED_OUTPATIENT_CLINIC_OR_DEPARTMENT_OTHER)
Admission: RE | Admit: 2022-07-24 | Discharge: 2022-07-24 | Disposition: A | Discharge status: Home / Self Care | Payer: Medicare HMO | Source: Home / Self Care | Attending: Gastroenterology | Admitting: Gastroenterology

## 2022-07-24 ENCOUNTER — Ambulatory Visit (HOSPITAL_BASED_OUTPATIENT_CLINIC_OR_DEPARTMENT_OTHER): Payer: Medicare HMO | Admitting: Certified Registered Nurse Anesthetist

## 2022-07-24 DIAGNOSIS — I252 Old myocardial infarction: Secondary | ICD-10-CM | POA: Insufficient documentation

## 2022-07-24 DIAGNOSIS — Z85038 Personal history of other malignant neoplasm of large intestine: Secondary | ICD-10-CM

## 2022-07-24 DIAGNOSIS — I200 Unstable angina: Secondary | ICD-10-CM | POA: Diagnosis not present

## 2022-07-24 DIAGNOSIS — Z08 Encounter for follow-up examination after completed treatment for malignant neoplasm: Secondary | ICD-10-CM | POA: Insufficient documentation

## 2022-07-24 DIAGNOSIS — K219 Gastro-esophageal reflux disease without esophagitis: Secondary | ICD-10-CM | POA: Insufficient documentation

## 2022-07-24 DIAGNOSIS — D125 Benign neoplasm of sigmoid colon: Secondary | ICD-10-CM | POA: Insufficient documentation

## 2022-07-24 DIAGNOSIS — I4891 Unspecified atrial fibrillation: Secondary | ICD-10-CM

## 2022-07-24 DIAGNOSIS — Z1211 Encounter for screening for malignant neoplasm of colon: Secondary | ICD-10-CM

## 2022-07-24 DIAGNOSIS — K222 Esophageal obstruction: Secondary | ICD-10-CM

## 2022-07-24 DIAGNOSIS — K6389 Other specified diseases of intestine: Secondary | ICD-10-CM | POA: Diagnosis not present

## 2022-07-24 DIAGNOSIS — E119 Type 2 diabetes mellitus without complications: Secondary | ICD-10-CM | POA: Insufficient documentation

## 2022-07-24 DIAGNOSIS — K641 Second degree hemorrhoids: Secondary | ICD-10-CM

## 2022-07-24 DIAGNOSIS — K635 Polyp of colon: Secondary | ICD-10-CM

## 2022-07-24 DIAGNOSIS — I480 Paroxysmal atrial fibrillation: Secondary | ICD-10-CM | POA: Insufficient documentation

## 2022-07-24 DIAGNOSIS — Z87891 Personal history of nicotine dependence: Secondary | ICD-10-CM | POA: Insufficient documentation

## 2022-07-24 DIAGNOSIS — K644 Residual hemorrhoidal skin tags: Secondary | ICD-10-CM | POA: Insufficient documentation

## 2022-07-24 DIAGNOSIS — I2510 Atherosclerotic heart disease of native coronary artery without angina pectoris: Secondary | ICD-10-CM | POA: Insufficient documentation

## 2022-07-24 DIAGNOSIS — Z09 Encounter for follow-up examination after completed treatment for conditions other than malignant neoplasm: Secondary | ICD-10-CM | POA: Insufficient documentation

## 2022-07-24 DIAGNOSIS — I10 Essential (primary) hypertension: Secondary | ICD-10-CM | POA: Insufficient documentation

## 2022-07-24 DIAGNOSIS — K449 Diaphragmatic hernia without obstruction or gangrene: Secondary | ICD-10-CM

## 2022-07-24 DIAGNOSIS — Z7984 Long term (current) use of oral hypoglycemic drugs: Secondary | ICD-10-CM | POA: Insufficient documentation

## 2022-07-24 DIAGNOSIS — D127 Benign neoplasm of rectosigmoid junction: Secondary | ICD-10-CM | POA: Insufficient documentation

## 2022-07-24 DIAGNOSIS — Z9049 Acquired absence of other specified parts of digestive tract: Secondary | ICD-10-CM | POA: Insufficient documentation

## 2022-07-24 DIAGNOSIS — Z980 Intestinal bypass and anastomosis status: Secondary | ICD-10-CM

## 2022-07-24 DIAGNOSIS — R079 Chest pain, unspecified: Secondary | ICD-10-CM | POA: Diagnosis not present

## 2022-07-24 DIAGNOSIS — K8001 Calculus of gallbladder with acute cholecystitis with obstruction: Secondary | ICD-10-CM | POA: Diagnosis not present

## 2022-07-24 HISTORY — PX: POLYPECTOMY: SHX5525

## 2022-07-24 HISTORY — PX: ENTEROSCOPY: SHX5533

## 2022-07-24 HISTORY — PX: COLONOSCOPY WITH PROPOFOL: SHX5780

## 2022-07-24 LAB — GLUCOSE, CAPILLARY: Glucose-Capillary: 126 mg/dL — ABNORMAL HIGH (ref 70–99)

## 2022-07-24 SURGERY — ENTEROSCOPY
Anesthesia: Monitor Anesthesia Care

## 2022-07-24 MED ORDER — PROPOFOL 500 MG/50ML IV EMUL
INTRAVENOUS | Status: DC | PRN
  Administered 2022-07-24: 125 ug/kg/min via INTRAVENOUS

## 2022-07-24 MED ORDER — LIDOCAINE 2% (20 MG/ML) 5 ML SYRINGE
INTRAMUSCULAR | Status: DC | PRN
  Administered 2022-07-24: 40 mg via INTRAVENOUS

## 2022-07-24 MED ORDER — ONDANSETRON HCL 4 MG/2ML IJ SOLN: 4.0000 mg | Freq: Once | INTRAMUSCULAR | Status: DC | PRN

## 2022-07-24 MED ORDER — LACTATED RINGERS IV SOLN: INTRAVENOUS | Status: DC

## 2022-07-24 MED ORDER — PROPOFOL 10 MG/ML IV BOLUS
INTRAVENOUS | Status: DC | PRN
  Administered 2022-07-24 (×2): 25 mg via INTRAVENOUS

## 2022-07-24 MED ORDER — APIXABAN 5 MG PO TABS: 5.0000 mg | ORAL_TABLET | Freq: Two times a day (BID) | ORAL | 6 refills | Status: DC

## 2022-07-24 MED ORDER — PROPOFOL 500 MG/50ML IV EMUL
INTRAVENOUS | Status: AC
  Filled 2022-07-24: qty 50

## 2022-07-24 MED ORDER — AMISULPRIDE (ANTIEMETIC) 5 MG/2ML IV SOLN: 10.0000 mg | Freq: Once | INTRAVENOUS | Status: DC | PRN

## 2022-07-24 SURGICAL SUPPLY — 22 items

## 2022-07-24 NOTE — Anesthesia Procedure Notes (Signed)
Procedure Name: MAC Date/Time: 07/24/2022 11:17 AM  Performed by: Vanessa South Dennis, CRNAPre-anesthesia Checklist: Patient identified, Emergency Drugs available, Suction available and Patient being monitored Patient Re-evaluated:Patient Re-evaluated prior to induction Oxygen Delivery Method: Simple face mask

## 2022-07-24 NOTE — Anesthesia Postprocedure Evaluation (Signed)
Anesthesia Post Note  Patient: [name] [name]  Procedure(s) Performed: ENTEROSCOPY COLONOSCOPY WITH PROPOFOL POLYPECTOMY     Patient location during evaluation: PACU Anesthesia Type: MAC Level of consciousness: awake and alert Pain management: pain level controlled Vital Signs Assessment: post-procedure vital signs reviewed and stable Respiratory status: spontaneous breathing, nonlabored ventilation, respiratory function stable and patient connected to nasal cannula oxygen Cardiovascular status: stable and blood pressure returned to baseline Postop Assessment: no apparent nausea or vomiting Anesthetic complications: no  No notable events documented.  Last Vitals:  Vitals:   07/24/22 1220 07/24/22 1227  BP: (!) 121/39 (!) 122/47  Pulse: (!) 52 (!) 49  Resp: 15 14  Temp:    SpO2: 96% 94%    Last Pain:  Vitals:   07/24/22 1227  TempSrc:   PainSc: 0-No pain                 Shelton Silvas

## 2022-07-24 NOTE — Anesthesia Preprocedure Evaluation (Addendum)
Anesthesia Evaluation  Patient identified by MRN, date of birth, ID band Patient awake    Reviewed: Allergy & Precautions, NPO status , Patient's Chart, lab work & pertinent test results, reviewed documented beta blocker date and time   Airway Mallampati: II  TM Distance: >3 FB Neck ROM: Full    Dental  (+)    Pulmonary former smoker   breath sounds clear to auscultation       Cardiovascular hypertension, Pt. on medications and Pt. on home beta blockers + CAD and + Past MI  + dysrhythmias Atrial Fibrillation  Rhythm:Regular Rate:Normal     Neuro/Psych  Headaches  negative psych ROS   GI/Hepatic Neg liver ROS,GERD  Medicated,,  Endo/Other  diabetes, Type 2, Oral Hypoglycemic Agents    Renal/GU Renal disease     Musculoskeletal  (+) Arthritis ,    Abdominal   Peds  Hematology   Anesthesia Other Findings   Reproductive/Obstetrics                             Anesthesia Physical Anesthesia Plan  ASA: 3  Anesthesia Plan: MAC   Post-op Pain Management: Minimal or no pain anticipated   Induction: Intravenous  PONV Risk Score and Plan: 0 and Propofol infusion  Airway Management Planned: Natural Airway  Additional Equipment: None  Intra-op Plan:   Post-operative Plan:   Informed Consent: I have reviewed the patients History and Physical, chart, labs and discussed the procedure including the risks, benefits and alternatives for the proposed anesthesia with the patient or authorized representative who has indicated his/her understanding and acceptance.       Plan Discussed with: CRNA  Anesthesia Plan Comments:        Anesthesia Quick Evaluation

## 2022-07-24 NOTE — Op Note (Signed)
[facility] Patient Name: [name] [name] Procedure Date: 07/24/2022 MRN: [id number] Attending MD: Corliss Parish , MD, [id number] Date of Birth: 1940/05/27 CSN: [id number] Age: 82 Admit Type: Outpatient Procedure:                Small bowel enteroscopy Indications:              Follow-up of precancerous lesions of the jejunum Providers:                Corliss Parish, MD, Marge Duncans, RN, Zoe Lan, RN, Kandice Robinsons, Technician, Harrington Challenger, Technician Referring MD:             Katrinka Blazing, Romie Levee, MD, Lucita Lora                            Reuel Boom MD, MD Medicines:                Monitored Anesthesia Care Complications:            No immediate complications. Estimated Blood Loss:     Estimated blood loss was minimal. Estimated blood                            loss: none. Procedure:                Pre-Anesthesia Assessment:                           - Prior to the procedure, a History and Physical                            was performed, and patient medications and                            allergies were reviewed. The patient's tolerance of                            previous anesthesia was also reviewed. The risks                            and benefits of the procedure and the sedation                            options and risks were discussed with the patient.                            All questions were answered, and informed consent                            was obtained. Prior Anticoagulants: The patient has  taken Eliquis (apixaban), last dose was 2 days                            prior to procedure. ASA Grade Assessment: III - A                            patient with severe systemic disease. After                            reviewing the risks and benefits, the patient was                            deemed in satisfactory condition to undergo the                             procedure.                           After obtaining informed consent, the endoscope was                            passed under direct vision. Throughout the                            procedure, the patient's blood pressure, pulse, and                            oxygen saturations were monitored continuously. The                            PCF-HQ190L ([id number]) Olympus colonoscope was                            introduced through the mouth and advanced to the                            proximal jejunum. The small bowel enteroscopy was                            accomplished without difficulty. The patient                            tolerated the procedure. Scope In: Scope Out: Findings:      No gross lesions were noted in the entire esophagus.      A non-obstructing Schatzki ring was found at the gastroesophageal       junction.      The Z-line was regular and was found 37 cm from the incisors.      A 2 cm hiatal hernia was present.      No gross lesions were noted in the entire examined stomach.      Normal mucosa was found in the entire visualized duodenum.      There appeared to be 2 separate tattoos in the proximal jejunum. T       looking very closely throughout, there was no evidence  of any recurrent       tubulovillous adenoma.      Normal mucosa was found in the rest of the visualized proximal jejunum. Impression:               - No gross lesions in the entire esophagus.                            Non-obstructing Schatzki ring. Z-line regular, 37                            cm from the incisors.                           - 2 cm hiatal hernia.                           - No gross lesions in the entire stomach.                           - Normal mucosa was found in the entire examined                            duodenum.                           - 2 separate tattoos were noted in the proximal                            jejunum. No evidence of recurrent  tubulovillous                            adenoma was found.                           - Normal mucosa was found in the rest of the                            visualized proximal jejunum. Recommendation:           - Proceed to scheduled colonoscopy.                           - Continue present medications.                           - Recommend repeat evaluation of jejunum in 2 to 3                            years, if patient remains in good health for                            surveillance purposes.                           - The findings and recommendations were discussed  with the patient.                           - The findings and recommendations were discussed                            with the patient's family. Procedure Code(s):        --- Professional ---                           [phone], Small intestinal endoscopy, enteroscopy                            beyond second portion of duodenum, not including                            ileum; diagnostic, including collection of                            specimen(s) by brushing or washing, when performed                            (separate procedure) Diagnosis Code(s):        --- Professional ---                           K22.2, Esophageal obstruction                           K44.9, Diaphragmatic hernia without obstruction or                            gangrene                           K63.89, Other specified diseases of intestine CPT copyright 2022 American [employer] Association. All rights reserved. The codes documented in this report are preliminary and upon coder review may  be revised to meet current compliance requirements. Corliss Parish, MD 07/24/2022 12:08:49 PM Number of Addenda: 0

## 2022-07-24 NOTE — Op Note (Signed)
[facility] Patient Name: [name] [name] Procedure Date: 07/24/2022 MRN: [id number] Attending MD: Corliss Parish , MD, [id number] Date of Birth: Nov 14, 1940 CSN: [id number] Age: 82 Admit Type: Outpatient Procedure:                Colonoscopy Indications:              High risk colon cancer surveillance: Personal                            history of colon cancer Providers:                Corliss Parish, MD, Zoe Lan, RN, Harrington Challenger, Technician Referring MD:             Romie Levee, MD, Katrinka Blazing, Lucita Lora                            Reuel Boom MD, MD Medicines:                Monitored Anesthesia Care Complications:            No immediate complications. Estimated Blood Loss:     Estimated blood loss was minimal. Procedure:                Pre-Anesthesia Assessment:                           - Prior to the procedure, a History and Physical                            was performed, and patient medications and                            allergies were reviewed. The patient's tolerance of                            previous anesthesia was also reviewed. The risks                            and benefits of the procedure and the sedation                            options and risks were discussed with the patient.                            All questions were answered, and informed consent                            was obtained. Prior Anticoagulants: The patient has                            taken Eliquis (apixaban), last dose was 2 days  prior to procedure. ASA Grade Assessment: III - A                            patient with severe systemic disease. After                            reviewing the risks and benefits, the patient was                            deemed in satisfactory condition to undergo the                            procedure.                           After obtaining informed consent, the  colonoscope                            was passed under direct vision. Throughout the                            procedure, the patient's blood pressure, pulse, and                            oxygen saturations were monitored continuously. The                            PCF-HQ190L ([id number] Olympus colonoscope was                            introduced through the anus and advanced to the the                            ileocolonic anastomosis. The colonoscopy was                            performed without difficulty. The patient tolerated                            the procedure. The quality of the bowel preparation                            was good. Scope In: 11:38:54 AM Scope Out: 11:57:48 AM Scope Withdrawal Time: 0 hours 15 minutes 57 seconds  Total Procedure Duration: 0 hours 18 minutes 54 seconds  Findings:      The digital rectal exam findings include hemorrhoids. Pertinent       negatives include no palpable rectal lesions.      There was evidence of a prior end-to-side ileo-colonic anastomosis in       the transverse colon. This was patent and was characterized by healthy       appearing mucosa. The anastomosis was traversed.      Four sessile polyps were found in the recto-sigmoid colon (3) and       sigmoid colon (1). The polyps were 3 to 8 mm  in size. These polyps were       removed with a cold snare. Resection and retrieval were complete.      Normal mucosa was found in the entire colon otherwise.      Non-bleeding non-thrombosed external and internal hemorrhoids were found       during retroflexion, during perianal exam and during digital exam. The       hemorrhoids were Grade II (internal hemorrhoids that prolapse but reduce       spontaneously). Impression:               - Hemorrhoids found on digital rectal exam.                           - Patent end-to-side ileo-colonic anastomosis,                            characterized by healthy appearing mucosa.                            - Four, 3 to 8 mm polyps at the recto-sigmoid colon                            and in the sigmoid colon, removed with a cold                            snare. Resected and retrieved.                           - Normal mucosa in the entire examined colon                            otherwise.                           - Non-bleeding non-thrombosed external and internal                            hemorrhoids. Moderate Sedation:      Not Applicable - Patient had care per Anesthesia. Recommendation:           - The patient will be observed post-procedure,                            until all discharge criteria are met.                           - Discharge patient to home.                           - Patient has a contact number available for                            emergencies. The signs and symptoms of potential                            delayed complications were discussed with the  patient. Return to normal activities tomorrow.                            Written discharge instructions were provided to the                            patient.                           - High fiber diet.                           - May restart Eliquis on 5/1 to decrease post                            interventional bleeding.                           - Continue present medications.                           - Await pathology results.                           - Repeat colonoscopy in 3 years for surveillance,                            pending patient's overall health.                           - The findings and recommendations were discussed                            with the patient.                           - The findings and recommendations were discussed                            with the patient's family. Procedure Code(s):        --- Professional ---                           [phone], Colonoscopy, flexible; with removal of                             tumor(s), polyp(s), or other lesion(s) by snare                            technique Diagnosis Code(s):        --- Professional ---                           U04.540, Personal history of other malignant                            neoplasm of large intestine  Z98.0, Intestinal bypass and anastomosis status                           D12.7, Benign neoplasm of rectosigmoid junction                           D12.5, Benign neoplasm of sigmoid colon                           K64.1, Second degree hemorrhoids CPT copyright 2022 American Medical Association. All rights reserved. The codes documented in this report are preliminary and upon coder review may  be revised to meet current compliance requirements. Corliss Parish, MD 07/24/2022 12:15:30 PM Number of Addenda: 0

## 2022-07-24 NOTE — Transfer of Care (Signed)
Immediate Anesthesia Transfer of Care Note  Patient: [name] [name]  Procedure(s) Performed: ENTEROSCOPY COLONOSCOPY WITH PROPOFOL POLYPECTOMY  Patient Location: Endoscopy Unit  Anesthesia Type:MAC  Level of Consciousness: drowsy and patient cooperative  Airway & Oxygen Therapy: Patient Spontanous Breathing and Patient connected to face mask  Post-op Assessment: Report given to RN and Post -op Vital signs reviewed and stable  Post vital signs: Reviewed and stable  Last Vitals:  Vitals Value Taken Time  BP 107/37 07/24/22 1206  Temp    Pulse 48 07/24/22 1208  Resp 12 07/24/22 1208  SpO2 99 % 07/24/22 1208  Vitals shown include unvalidated device data.  Last Pain:  Vitals:   07/24/22 1203  TempSrc:   PainSc: Asleep         Complications: No notable events documented.

## 2022-07-24 NOTE — H&P (Signed)
GASTROENTEROLOGY PROCEDURE H&P NOTE   Primary Care Physician: Richardean Chimera, MD  HPI: [name] [name] is a 82 y.o. male who presents for Enteroscopy/Colonoscopy for evaluation of previous Jejunal TVA and for Colon Cancer followup post colectomy.  Past Medical History:  Diagnosis Date   Anemia    Arthritis    CAD (coronary artery disease)    DES to circumflex 12/2017   Colon cancer (HCC) 2023   Diabetes mellitus without complication (HCC)    Dysrhythmia    GERD (gastroesophageal reflux disease)    Glaucoma    Gout    Headache    History of kidney stones    Hypertension    NSTEMI (non-ST elevated myocardial infarction) (HCC) 12/31/2017   Paroxysmal atrial fibrillation (HCC)    Pericardial effusion    Idiopathic, recurrent pericardial effusion s/p pericardial window.   Prostate cancer (HCC) 2007   S/P Seed implants    Supraventricular tachycardia    Temporal arteritis [facility])    Past Surgical History:  Procedure Laterality Date   BIOPSY  10/12/2020   Procedure: BIOPSY;  Surgeon: Dolores Frame, MD;  Location: [facility];  Service: Gastroenterology;;   BIOPSY  02/25/2021   Procedure: BIOPSY;  Surgeon: Dolores Frame, MD;  [facility]: [facility];  Service: Gastroenterology;;   BIOPSY  04/11/2021   Procedure: BIOPSY;  Surgeon: Lemar Lofty., MD;  Location: [facility];  Service: Gastroenterology;;   CATARACT EXTRACTION W/PHACO Left 06/07/2015   Procedure: CATARACT EXTRACTION PHACO AND INTRAOCULAR LENS PLACEMENT LEFT EYE CDE=8.00;  Surgeon: Gemma Payor, MD;  Location: [facility];  Service: Ophthalmology;  Laterality: Left;   CATARACT EXTRACTION W/PHACO Right 06/17/2015   Procedure: CATARACT EXTRACTION PHACO AND INTRAOCULAR LENS PLACEMENT RIGHT EYE CDE=11.09;  Surgeon: Gemma Payor, MD;  Location: [facility];  Service: Ophthalmology;  Laterality: Right;   COLONOSCOPY WITH PROPOFOL N/A 10/12/2020   Procedure: COLONOSCOPY WITH PROPOFOL;  Surgeon: Dolores Frame, MD;  [facility];  Service: Gastroenterology;  Laterality: N/A;  10:35   COLONOSCOPY WITH PROPOFOL N/A 02/25/2021   Procedure: COLONOSCOPY WITH PROPOFOL;  Surgeon: Dolores Frame, MD;  [facility];  Service: Gastroenterology;  Laterality: N/A;  8:10   CORONARY ANGIOPLASTY WITH STENT PLACEMENT  01/01/2018   CORONARY STENT INTERVENTION N/A 01/01/2018   Procedure: CORONARY STENT INTERVENTION;  Surgeon: Corky Crafts, MD;  Location: [facility];  Service: Cardiovascular;  Laterality: N/A;   ENDOSCOPIC MUCOSAL RESECTION N/A 04/11/2021   Procedure: ENDOSCOPIC MUCOSAL RESECTION;  Surgeon: Meridee Score Netty Starring., MD;  Location: [facility];  Service: Gastroenterology;  Laterality: N/A;   ENTEROSCOPY N/A 10/12/2020   Procedure: PUSH ENTEROSCOPY;  Surgeon: Dolores Frame, MD;  [facility];  Service: Gastroenterology;  Laterality: N/A;   ENTEROSCOPY N/A 04/11/2021   Procedure: ENTEROSCOPY;  Surgeon: Meridee Score Netty Starring., MD;  Location: [facility];  Service: Gastroenterology;  Laterality: N/A;   ESOPHAGOGASTRODUODENOSCOPY (EGD) WITH PROPOFOL N/A 10/12/2020   Procedure: ESOPHAGOGASTRODUODENOSCOPY (EGD) WITH PROPOFOL;  Surgeon: Dolores Frame, MD;  [facility];  Service: Gastroenterology;  Laterality: N/A;   FRACTURE SURGERY Left 2010   ankle   HEMOSTASIS CLIP PLACEMENT  04/11/2021   Procedure: HEMOSTASIS CLIP PLACEMENT;  Surgeon: Lemar Lofty., MD;  Location: [facility];  Service: Gastroenterology;;   INSERTION PROSTATE RADIATION SEED  2007   KNEE ARTHROSCOPY Left    LAPAROSCOPY N/A 05/22/2021   Procedure: LAPAROSCOPY DIAGNOSTIC WITH WASHOUT OF ABDOMEN;  Surgeon:  Romie Levee, MD;  Location: [facility];  Service: General;  Laterality: N/A;   LEFT HEART CATH AND CORONARY ANGIOGRAPHY N/A 01/01/2018   Procedure: LEFT HEART CATH AND CORONARY ANGIOGRAPHY;  Surgeon: Corky Crafts, MD;   Location: [facility] INVASIVE CV LAB;  Service: Cardiovascular;  Laterality: N/A;   ORIF TIBIA & FIBULA FRACTURES Left 2003   Distal tibial/fibula    PERICARDIAL WINDOW  02/2007   PERICARDIOCENTESIS  2007   hx/notes 10/19/2011   POLYPECTOMY  10/12/2020   Procedure: POLYPECTOMY;  Surgeon: Dolores Frame, MD;  [facility];  Service: Gastroenterology;;  small bowel, cecal   POLYPECTOMY  02/25/2021   Procedure: POLYPECTOMY;  Surgeon: Dolores Frame, MD;  [facility];  Service: Gastroenterology;;  transverse colon x2   SUBMUCOSAL LIFTING INJECTION  04/11/2021   Procedure: SUBMUCOSAL LIFTING INJECTION;  Surgeon: Lemar Lofty., MD;  Location: [facility];  Service: Gastroenterology;;   SUBMUCOSAL TATTOO INJECTION  04/11/2021   Procedure: SUBMUCOSAL TATTOO INJECTION;  Surgeon: Lemar Lofty., MD;  Location: [facility];  Service: Gastroenterology;;   Current Facility-Administered Medications  Medication Dose Route Frequency Provider Last Rate Last Admin   lactated ringers infusion   Intravenous Continuous Mansouraty, Netty Starring., MD 10 mL/hr at 07/24/22 1024 New Bag at 07/24/22 1024   Facility-Administered Medications Ordered in Other Encounters  Medication Dose Route Frequency Provider Last Rate Last Admin   lactated ringers infusion   Intravenous Continuous PRN Vanessa De Beque, CRNA   Stopped at 05/11/22 1355    Current Facility-Administered Medications:    lactated ringers infusion, , Intravenous, Continuous, Mansouraty, Netty Starring., MD, Last Rate: 10 mL/hr at 07/24/22 1024, New Bag at 07/24/22 1024  Facility-Administered Medications Ordered in Other Encounters:    lactated ringers infusion, , Intravenous, Continuous PRN, Vanessa Culpeper, CRNA, Stopped at 05/11/22 1355 Allergies  Allergen Reactions   Amiodarone Rash   Family History  Problem Relation Age of Onset   CAD Mother        MI in 76s   Prostate cancer Father     Prostate cancer Brother    Lung cancer Brother    Social History   Socioeconomic History   Marital status: Married    Spouse name: Not on file   Number of children: Not on file   Years of education: Not on file   Highest education level: Not on file  Occupational History   Not on file  Tobacco Use   Smoking status: Former    Packs/day: 0.30    Years: 10.00    Additional pack years: 0.00    Total pack years: 3.00    Types: Cigarettes    Start date: 08/06/1958    Quit date: 1968    Years since quitting: 56.3   Smokeless tobacco: Never  Vaping Use   Vaping Use: Never used  Substance and Sexual Activity   Alcohol use: Not Currently   Drug use: Never   Sexual activity: Yes  Other Topics Concern   Not on file  Social History Narrative   Not on file   Social Determinants of Health   Financial Resource Strain: Not on file  Food Insecurity: Not on file  Transportation Needs: Not on file  Physical Activity: Not on file  Stress: Not on file  Social Connections: Not on file  Intimate Partner Violence: Not on file    Physical Exam: Today's Vitals   07/19/22 1142 07/24/22 1013  BP:  (!) 137/59  Pulse:  60  Resp:  11  Temp:  97.6 F (36.4 C)  TempSrc:  Tympanic  SpO2:  98%  Weight: 97 kg 95.3 kg  Height:  5\' 11"  (1.803 m)  PainSc:  0-No pain   Body mass index is 29.29 kg/m. GEN: NAD EYE: Sclerae anicteric ENT: MMM CV: Non-tachycardic GI: Soft, NT/ND NEURO:  Alert & Oriented x 3  Lab Results: No results for input(s): "WBC", "HGB", "HCT", "PLT" in the last 72 hours. BMET No results for input(s): "NA", "K", "CL", "CO2", "GLUCOSE", "BUN", "CREATININE", "CALCIUM" in the last 72 hours. LFT No results for input(s): "PROT", "ALBUMIN", "AST", "ALT", "ALKPHOS", "BILITOT", "BILIDIR", "IBILI" in the last 72 hours. PT/INR No results for input(s): "LABPROT", "INR" in the last 72 hours.   Impression / Plan: This is a 82 y.o.male who presents for  Enteroscopy/Colonoscopy for evaluation of previous Jejunal TVA and for Colon Cancer followup post colectomy.  The risks and benefits of endoscopic evaluation/treatment were discussed with the patient and/or family; these include but are not limited to the risk of perforation, infection, bleeding, missed lesions, lack of diagnosis, severe illness requiring hospitalization, as well as anesthesia and sedation related illnesses.  The patient's history has been reviewed, patient examined, no change in status, and deemed stable for procedure.  The patient and/or family is agreeable to proceed.    Corliss Parish, MD [facility] Advanced [facility]oscopy Office # [phone]

## 2022-07-24 NOTE — Discharge Instructions (Signed)
YOU HAD AN ENDOSCOPIC PROCEDURE TODAY: Refer to the procedure report and other information in the discharge instructions given to you for any specific questions about what was found during the examination. If this information does not answer your questions, please call [place] office at [phone] to clarify.  ° °YOU SHOULD EXPECT: Some feelings of bloating in the abdomen. Passage of more gas than usual. Walking can help get rid of the air that was put into your GI tract during the procedure and reduce the bloating. If you had a lower endoscopy (such as a colonoscopy or flexible sigmoidoscopy) you may notice spotting of blood in your stool or on the toilet paper. Some abdominal soreness may be present for a day or two, also. ° °DIET: Your first meal following the procedure should be a light meal and then it is ok to progress to your normal diet. A half-sandwich or bowl of soup is an example of a good first meal. Heavy or fried foods are harder to digest and may make you feel nauseous or bloated. Drink plenty of fluids but you should avoid alcoholic beverages for 24 hours.  ° °ACTIVITY: Your care partner should take you home directly after the procedure. You should plan to take it easy, moving slowly for the rest of the day. You can resume normal activity the day after the procedure however YOU SHOULD NOT DRIVE, use power tools, machinery or perform tasks that involve climbing or major physical exertion for 24 hours (because of the sedation medicines used during the test).  ° °SYMPTOMS TO REPORT IMMEDIATELY: °A gastroenterologist can be reached at any hour. Please call [phone]  for any of the following symptoms:  °Following lower endoscopy (colonoscopy, flexible sigmoidoscopy) °Excessive amounts of blood in the stool  °Significant tenderness, worsening of abdominal pains  °Swelling of the abdomen that is new, acute  °Fever of 100° or higher  °Following upper endoscopy (EGD, EUS, ERCP, esophageal  dilation) °Vomiting of blood or coffee ground material  °New, significant abdominal pain  °New, significant chest pain or pain under the shoulder blades  °Painful or persistently difficult swallowing  °New shortness of breath  °Black, tarry-looking or red, bloody stools ° °FOLLOW UP:  °If any biopsies were taken you will be contacted by phone or by letter within the next 1-3 weeks. Call [phone]  if you have not heard about the biopsies in 3 weeks.  °Please also call with any specific questions about appointments or follow up tests.  °

## 2022-07-25 ENCOUNTER — Encounter (HOSPITAL_COMMUNITY): Payer: Self-pay | Admitting: Emergency Medicine

## 2022-07-25 ENCOUNTER — Encounter: Payer: Self-pay | Admitting: Gastroenterology

## 2022-07-25 ENCOUNTER — Other Ambulatory Visit: Payer: Self-pay

## 2022-07-25 ENCOUNTER — Emergency Department (HOSPITAL_COMMUNITY): Payer: Medicare HMO

## 2022-07-25 ENCOUNTER — Inpatient Hospital Stay (HOSPITAL_COMMUNITY)
Admission: EM | Admit: 2022-07-25 | Discharge: 2022-07-29 | DRG: 418 | Disposition: A | Discharge status: Home / Self Care | Payer: Medicare HMO | Attending: Family Medicine | Admitting: Family Medicine

## 2022-07-25 DIAGNOSIS — D125 Benign neoplasm of sigmoid colon: Secondary | ICD-10-CM | POA: Diagnosis present

## 2022-07-25 DIAGNOSIS — I252 Old myocardial infarction: Secondary | ICD-10-CM

## 2022-07-25 DIAGNOSIS — Z955 Presence of coronary angioplasty implant and graft: Secondary | ICD-10-CM

## 2022-07-25 DIAGNOSIS — I25110 Atherosclerotic heart disease of native coronary artery with unstable angina pectoris: Secondary | ICD-10-CM | POA: Diagnosis present

## 2022-07-25 DIAGNOSIS — D696 Thrombocytopenia, unspecified: Secondary | ICD-10-CM | POA: Diagnosis present

## 2022-07-25 DIAGNOSIS — D649 Anemia, unspecified: Secondary | ICD-10-CM | POA: Diagnosis present

## 2022-07-25 DIAGNOSIS — I2510 Atherosclerotic heart disease of native coronary artery without angina pectoris: Secondary | ICD-10-CM | POA: Diagnosis present

## 2022-07-25 DIAGNOSIS — Z8042 Family history of malignant neoplasm of prostate: Secondary | ICD-10-CM

## 2022-07-25 DIAGNOSIS — K8001 Calculus of gallbladder with acute cholecystitis with obstruction: Principal | ICD-10-CM | POA: Diagnosis present

## 2022-07-25 DIAGNOSIS — R079 Chest pain, unspecified: Secondary | ICD-10-CM | POA: Diagnosis not present

## 2022-07-25 DIAGNOSIS — Z85038 Personal history of other malignant neoplasm of large intestine: Secondary | ICD-10-CM

## 2022-07-25 DIAGNOSIS — K8000 Calculus of gallbladder with acute cholecystitis without obstruction: Secondary | ICD-10-CM | POA: Diagnosis present

## 2022-07-25 DIAGNOSIS — K828 Other specified diseases of gallbladder: Secondary | ICD-10-CM | POA: Diagnosis present

## 2022-07-25 DIAGNOSIS — E1122 Type 2 diabetes mellitus with diabetic chronic kidney disease: Secondary | ICD-10-CM | POA: Diagnosis present

## 2022-07-25 DIAGNOSIS — Z7984 Long term (current) use of oral hypoglycemic drugs: Secondary | ICD-10-CM

## 2022-07-25 DIAGNOSIS — I200 Unstable angina: Secondary | ICD-10-CM | POA: Diagnosis not present

## 2022-07-25 DIAGNOSIS — K219 Gastro-esophageal reflux disease without esophagitis: Secondary | ICD-10-CM | POA: Diagnosis present

## 2022-07-25 DIAGNOSIS — N179 Acute kidney failure, unspecified: Secondary | ICD-10-CM | POA: Insufficient documentation

## 2022-07-25 DIAGNOSIS — Z888 Allergy status to other drugs, medicaments and biological substances status: Secondary | ICD-10-CM

## 2022-07-25 DIAGNOSIS — I452 Bifascicular block: Secondary | ICD-10-CM | POA: Diagnosis present

## 2022-07-25 DIAGNOSIS — Z87891 Personal history of nicotine dependence: Secondary | ICD-10-CM

## 2022-07-25 DIAGNOSIS — D127 Benign neoplasm of rectosigmoid junction: Secondary | ICD-10-CM | POA: Diagnosis present

## 2022-07-25 DIAGNOSIS — E782 Mixed hyperlipidemia: Secondary | ICD-10-CM | POA: Diagnosis present

## 2022-07-25 DIAGNOSIS — I480 Paroxysmal atrial fibrillation: Secondary | ICD-10-CM | POA: Diagnosis present

## 2022-07-25 DIAGNOSIS — K222 Esophageal obstruction: Secondary | ICD-10-CM | POA: Diagnosis present

## 2022-07-25 DIAGNOSIS — Z801 Family history of malignant neoplasm of trachea, bronchus and lung: Secondary | ICD-10-CM

## 2022-07-25 DIAGNOSIS — E1165 Type 2 diabetes mellitus with hyperglycemia: Secondary | ICD-10-CM | POA: Diagnosis present

## 2022-07-25 DIAGNOSIS — Z79899 Other long term (current) drug therapy: Secondary | ICD-10-CM

## 2022-07-25 DIAGNOSIS — Z7901 Long term (current) use of anticoagulants: Secondary | ICD-10-CM

## 2022-07-25 DIAGNOSIS — K449 Diaphragmatic hernia without obstruction or gangrene: Secondary | ICD-10-CM | POA: Diagnosis present

## 2022-07-25 DIAGNOSIS — Z8546 Personal history of malignant neoplasm of prostate: Secondary | ICD-10-CM

## 2022-07-25 DIAGNOSIS — M109 Gout, unspecified: Secondary | ICD-10-CM | POA: Diagnosis present

## 2022-07-25 DIAGNOSIS — Z8249 Family history of ischemic heart disease and other diseases of the circulatory system: Secondary | ICD-10-CM

## 2022-07-25 DIAGNOSIS — R7989 Other specified abnormal findings of blood chemistry: Secondary | ICD-10-CM | POA: Insufficient documentation

## 2022-07-25 DIAGNOSIS — E860 Dehydration: Secondary | ICD-10-CM | POA: Diagnosis present

## 2022-07-25 DIAGNOSIS — I10 Essential (primary) hypertension: Secondary | ICD-10-CM | POA: Diagnosis present

## 2022-07-25 DIAGNOSIS — K641 Second degree hemorrhoids: Secondary | ICD-10-CM | POA: Diagnosis present

## 2022-07-25 LAB — CBC WITH DIFFERENTIAL/PLATELET
Abs Immature Granulocytes: 0.05 10*3/uL (ref 0.00–0.07)
Basophils Absolute: 0 10*3/uL (ref 0.0–0.1)
Basophils Relative: 0 %
Eosinophils Absolute: 0 10*3/uL (ref 0.0–0.5)
Eosinophils Relative: 0 %
HCT: 36.6 % — ABNORMAL LOW (ref 39.0–52.0)
Hemoglobin: 12.1 g/dL — ABNORMAL LOW (ref 13.0–17.0)
Immature Granulocytes: 1 %
Lymphocytes Relative: 10 %
Lymphs Abs: 0.8 10*3/uL (ref 0.7–4.0)
MCH: 30.4 pg (ref 26.0–34.0)
MCHC: 33.1 g/dL (ref 30.0–36.0)
MCV: 92 fL (ref 80.0–100.0)
Monocytes Absolute: 0.7 10*3/uL (ref 0.1–1.0)
Monocytes Relative: 8 %
Neutro Abs: 6.6 10*3/uL (ref 1.7–7.7)
Neutrophils Relative %: 81 %
Platelets: 147 10*3/uL — ABNORMAL LOW (ref 150–400)
RBC: 3.98 MIL/uL — ABNORMAL LOW (ref 4.22–5.81)
RDW: 15 % (ref 11.5–15.5)
WBC: 8.2 10*3/uL (ref 4.0–10.5)
nRBC: 0 % (ref 0.0–0.2)

## 2022-07-25 LAB — BASIC METABOLIC PANEL
Anion gap: 7 (ref 5–15)
BUN: 24 mg/dL — ABNORMAL HIGH (ref 8–23)
CO2: 22 mmol/L (ref 22–32)
Calcium: 9.3 mg/dL (ref 8.9–10.3)
Chloride: 105 mmol/L (ref 98–111)
Creatinine, Ser: 1.23 mg/dL (ref 0.61–1.24)
GFR, Estimated: 59 mL/min — ABNORMAL LOW (ref >60)
Glucose, Bld: 193 mg/dL — ABNORMAL HIGH (ref 70–99)
Potassium: 4.1 mmol/L (ref 3.5–5.1)
Sodium: 134 mmol/L — ABNORMAL LOW (ref 135–145)

## 2022-07-25 LAB — TROPONIN I (HIGH SENSITIVITY)
Troponin I (High Sensitivity): 11 ng/L (ref <18)
Troponin I (High Sensitivity): 10 ng/L (ref <18)

## 2022-07-25 LAB — SURGICAL PATHOLOGY

## 2022-07-25 LAB — PROTIME-INR
INR: 1.1 (ref 0.8–1.2)
Prothrombin Time: 13.8 seconds (ref 11.4–15.2)

## 2022-07-25 MED ORDER — APIXABAN 5 MG PO TABS
5.0000 mg | ORAL_TABLET | Freq: Two times a day (BID) | ORAL | Status: DC
  Filled 2022-07-25: qty 1

## 2022-07-25 MED ORDER — SPIRONOLACTONE 12.5 MG HALF TABLET
12.5000 mg | ORAL_TABLET | Freq: Every day | ORAL | Status: DC
  Administered 2022-07-26 – 2022-07-29 (×4): 12.5 mg via ORAL
  Filled 2022-07-25 (×4): qty 1

## 2022-07-25 MED ORDER — ONDANSETRON HCL 4 MG/2ML IJ SOLN: 4.0000 mg | Freq: Four times a day (QID) | INTRAMUSCULAR | Status: DC | PRN

## 2022-07-25 MED ORDER — HYDRALAZINE HCL 25 MG PO TABS
50.0000 mg | ORAL_TABLET | ORAL | Status: DC
  Administered 2022-07-26: 50 mg via ORAL
  Filled 2022-07-25: qty 2

## 2022-07-25 MED ORDER — SODIUM CHLORIDE 0.9 % IV SOLN: INTRAVENOUS | Status: DC

## 2022-07-25 MED ORDER — HYDRALAZINE HCL 25 MG PO TABS
100.0000 mg | ORAL_TABLET | Freq: Two times a day (BID) | ORAL | Status: DC
  Administered 2022-07-25 – 2022-07-26 (×2): 100 mg via ORAL
  Filled 2022-07-25 (×2): qty 4

## 2022-07-25 MED ORDER — PANTOPRAZOLE SODIUM 40 MG PO TBEC
40.0000 mg | DELAYED_RELEASE_TABLET | Freq: Every day | ORAL | Status: DC
  Administered 2022-07-26 – 2022-07-29 (×4): 40 mg via ORAL
  Filled 2022-07-25 (×4): qty 1

## 2022-07-25 MED ORDER — BENAZEPRIL HCL 20 MG PO TABS
40.0000 mg | ORAL_TABLET | Freq: Every day | ORAL | Status: DC
  Administered 2022-07-26 – 2022-07-29 (×4): 40 mg via ORAL
  Filled 2022-07-25 (×4): qty 2

## 2022-07-25 MED ORDER — ASPIRIN 81 MG PO CHEW
324.0000 mg | CHEWABLE_TABLET | Freq: Once | ORAL | Status: AC
  Administered 2022-07-25: 324 mg via ORAL
  Filled 2022-07-25: qty 4

## 2022-07-25 MED ORDER — POLYSACCHARIDE IRON COMPLEX 150 MG PO CAPS
150.0000 mg | ORAL_CAPSULE | Freq: Two times a day (BID) | ORAL | Status: DC
  Administered 2022-07-25 – 2022-07-29 (×8): 150 mg via ORAL
  Filled 2022-07-25 (×8): qty 1

## 2022-07-25 MED ORDER — DILTIAZEM HCL ER COATED BEADS 240 MG PO CP24
240.0000 mg | ORAL_CAPSULE | Freq: Every day | ORAL | Status: DC
  Administered 2022-07-26 – 2022-07-29 (×4): 240 mg via ORAL
  Filled 2022-07-25 (×4): qty 1

## 2022-07-25 MED ORDER — MORPHINE SULFATE (PF) 4 MG/ML IV SOLN
4.0000 mg | Freq: Once | INTRAVENOUS | Status: AC
  Administered 2022-07-25: 4 mg via INTRAVENOUS
  Filled 2022-07-25: qty 1

## 2022-07-25 MED ORDER — ATORVASTATIN CALCIUM 40 MG PO TABS
80.0000 mg | ORAL_TABLET | Freq: Every day | ORAL | Status: DC
  Filled 2022-07-25: qty 2

## 2022-07-25 MED ORDER — NITROGLYCERIN 0.4 MG SL SUBL
0.4000 mg | SUBLINGUAL_TABLET | SUBLINGUAL | Status: DC | PRN
  Filled 2022-07-25: qty 1

## 2022-07-25 MED ORDER — METOPROLOL SUCCINATE ER 50 MG PO TB24
50.0000 mg | ORAL_TABLET | Freq: Every day | ORAL | Status: DC
  Filled 2022-07-25 (×2): qty 1

## 2022-07-25 MED ORDER — ACETAMINOPHEN 325 MG PO TABS: 650.0000 mg | ORAL_TABLET | ORAL | Status: DC | PRN

## 2022-07-25 NOTE — ED Triage Notes (Signed)
Pt c/o central chest pain that radiates through to his back with nausea x 3 hours. Pt had an endoscopy and colonoscopy yesterday.

## 2022-07-25 NOTE — ED Notes (Signed)
Pt denies any pain after the morphine. Feels much better. No needs at this time. Wife at bedside

## 2022-07-25 NOTE — ED Provider Notes (Signed)
[place] EMERGENCY DEPARTMENT AT [facility] Provider Note   CSN: [id number] Arrival date & time: 07/25/22  1951     History  Chief Complaint  Patient presents with   Chest Pain    [name] [name] is a 82 y.o. male.   Chest Pain  This patient is an 82 year old male being treated for hypertension with an ACE inhibitor, he is on a statin, he also takes Cardizem and hydralazine as well as metformin.  He has a known history of coronary disease with stenting in 2019 during a left heart catheterization, he had a echocardiogram recently which showed an ejection fraction of 70% but grade 1 diastolic dysfunction.  The patient underwent a upper endoscopy and a colonoscopy yesterday in search of a follow-up for a tubulovillous adenoma as well as colon cancer screening status post partial colectomy.  He reports that he did fine, he was fine this morning, he even went for a 2 mile walk but upon completing the walk was sitting on the porch when he developed a severe heaviness and discomfort in his chest with radiation to his back with an associated significant nausea.  He denies shortness of breath, denies diaphoresis, denies swelling of his legs.  The pain has been constant for the last couple of hours.    Home Medications Prior to Admission medications   Medication Sig Start Date End Date Taking? Authorizing Provider  acetaminophen (TYLENOL) 325 MG tablet Take 650 mg by mouth every 4 (four) hours as needed (pain or fever).   Yes [provider]  allopurinol (ZYLOPRIM) 300 MG tablet Take 450 mg by mouth in the morning. 02/07/22  Yes [provider]  aluminum-magnesium hydroxide 200-200 MG/5ML suspension Take 5 mLs by mouth every 6 (six) hours as needed for indigestion.   Yes [provider]  apixaban (ELIQUIS) 5 MG TABS tablet Take 1 tablet (5 mg total) by mouth 2 (two) times daily for 2 days. 07/24/22 07/26/22 Yes Mansouraty, Netty Starring., MD  Ascorbic Acid (VITAMIN  C) 1000 MG tablet Take 1,000 mg by mouth 2 (two) times daily.   Yes [provider]  atorvastatin (LIPITOR) 80 MG tablet TAKE 1 TABLET BY MOUTH ONCE DAILY AT  6  PM Patient taking differently: Take 80 mg by mouth daily. 12/01/21  Yes Jonelle Sidle, MD  benazepril (LOTENSIN) 40 MG tablet TAKE 1 TABLET BY MOUTH ONCE DAILY *NEEDS  OFFICE  VISIT* Patient taking differently: Take 40 mg by mouth daily. 12/13/21  Yes Jonelle Sidle, MD  cyanocobalamin 1000 MCG tablet Take 1 tablet (1,000 mcg total) by mouth daily. 11/22/21  Yes Kathlen Mody, MD  diltiazem (CARDIZEM CD) 240 MG 24 hr capsule Take 240 mg by mouth daily.   Yes [provider]  dorzolamide (TRUSOPT) 2 % ophthalmic solution Place 1 drop into both eyes 2 (two) times daily. 03/30/21  Yes [provider]  FERREX 150 150 MG capsule Take 150 mg by mouth 2 (two) times daily. 01/23/20  Yes [provider]  hydrALAZINE (APRESOLINE) 100 MG tablet Take 100 mg in the morning, 50 mg midday, and 100 mg at bedtime Patient taking differently: Take 50-100 mg by mouth See admin instructions. Take 100 mg in the morning, 50 mg midday, and 100 mg at bedtime 05/24/22  Yes Jonelle Sidle, MD  latanoprost (XALATAN) 0.005 % ophthalmic solution Place 1 drop into both eyes at bedtime. 01/22/20  Yes [provider]  loperamide (IMODIUM) 2 MG capsule Take  2 mg by mouth as needed for diarrhea or loose stools.   Yes [provider]  metFORMIN (GLUCOPHAGE) 500 MG tablet Take 500 mg by mouth 2 (two) times daily. 03/14/22  Yes [provider]  metoprolol succinate (TOPROL-XL) 50 MG 24 hr tablet Take 50 mg by mouth daily. Take with or immediately following a meal.   Yes [provider]  Multiple Vitamin (MULTIVITAMIN) tablet Take 1 tablet by mouth in the morning.   Yes [provider]  pantoprazole (PROTONIX) 40 MG tablet Take 1 tablet by mouth once daily 06/14/22  Yes Jonelle Sidle, MD   prochlorperazine (COMPAZINE) 10 MG tablet Take 1 tablet (10 mg total) by mouth every 6 (six) hours as needed for nausea or vomiting. 06/23/21  Yes Doreatha Massed, MD  spironolactone (ALDACTONE) 25 MG tablet Take 0.5 tablets (12.5 mg total) by mouth daily. 06/09/22  Yes Jonelle Sidle, MD  [facility] ULTRA test strip USE 1 STRIP TO CHECK GLUCOSE ONCE DAILY 06/06/21   [provider]  polyethylene glycol-electrolytes (NULYTELY) 420 g solution Take 4,000 mLs by mouth once. Patient not taking: Reported on 07/25/2022 06/01/22   [provider]      Allergies    Amiodarone    Review of Systems   Review of Systems  Cardiovascular:  Positive for chest pain.  All other systems reviewed and are negative.   Physical Exam Updated Vital Signs BP (!) 166/62   Pulse (!) 57   Temp 97.9 F (36.6 C)   Resp 13   SpO2 96%  Physical Exam Vitals and nursing note reviewed.  Constitutional:      General: He is not in acute distress.    Appearance: He is well-developed.  HENT:     Head: Normocephalic and atraumatic.     Mouth/Throat:     Pharynx: No oropharyngeal exudate.  Eyes:     General: No scleral icterus.       Right eye: No discharge.        Left eye: No discharge.     Conjunctiva/sclera: Conjunctivae normal.     Pupils: Pupils are equal, round, and reactive to light.  Neck:     Thyroid: No thyromegaly.     Vascular: No JVD.  Cardiovascular:     Rate and Rhythm: Normal rate and regular rhythm.     Heart sounds: Normal heart sounds. No murmur heard.    No friction rub. No gallop.  Pulmonary:     Effort: Pulmonary effort is normal. No respiratory distress.     Breath sounds: Normal breath sounds. No wheezing or rales.  Abdominal:     General: Bowel sounds are normal. There is no distension.     Palpations: Abdomen is soft. There is no mass.     Tenderness: There is no abdominal tenderness.  Musculoskeletal:        General: No tenderness. Normal range of motion.      Cervical back: Normal range of motion and neck supple.     Right lower leg: No edema.     Left lower leg: No edema.  Lymphadenopathy:     Cervical: No cervical adenopathy.  Skin:    General: Skin is warm and dry.     Findings: No erythema or rash.  Neurological:     Mental Status: He is alert.     Coordination: Coordination normal.  Psychiatric:        Behavior: Behavior normal.     ED Results /  Procedures / Treatments   Labs (all labs ordered are listed, but only abnormal results are displayed) Labs Reviewed  CBC WITH DIFFERENTIAL/PLATELET - Abnormal; Notable for the following components:      Result Value   RBC 3.98 (*)    Hemoglobin 12.1 (*)    HCT 36.6 (*)    Platelets 147 (*)    All other components within normal limits  BASIC METABOLIC PANEL - Abnormal; Notable for the following components:   Sodium 134 (*)    Glucose, Bld 193 (*)    BUN 24 (*)    GFR, Estimated 59 (*)    All other components within normal limits  PROTIME-INR  TROPONIN I (HIGH SENSITIVITY)    EKG EKG Interpretation  Date/Time:  Tuesday July 25 2022 20:07:03 EDT Ventricular Rate:  58 PR Interval:  181 QRS Duration: 127 QT Interval:  443 QTC Calculation: 436 R Axis:   -48 Text Interpretation: Sinus rhythm RBBB and LAFB Minimal ST elevation, lateral leads Confirmed by Eber Hong ([id number]) on 07/25/2022 8:11:50 PM  Radiology DG Chest Port 1 View  Result Date: 07/25/2022 CLINICAL DATA:  Chest pain EXAM: PORTABLE CHEST 1 VIEW COMPARISON:  Chest x-ray 11/19/2021. CT abdomen and pelvis 05/15/2022. CT chest abdomen and pelvis 11/08/2021. FINDINGS: Heart is mildly enlarged, unchanged. Small nodular density seen in the right costophrenic angle, not seen on prior CT. The lungs are otherwise clear. There is no pleural effusion or pneumothorax. No acute fractures are seen. IMPRESSION: 1. No acute cardiopulmonary process. 2. Small nodular density in the right costophrenic angle, not seen on prior CT.  Follow-up chest CT recommended, non emergently. Electronically Signed   By: Darliss Cheney M.D.   On: 07/25/2022 21:26    Procedures Procedures    Medications Ordered in ED Medications  nitroGLYCERIN (NITROSTAT) SL tablet 0.4 mg (has no administration in time range)  aspirin chewable tablet 324 mg (324 mg Oral Given 07/25/22 2042)  morphine (PF) 4 MG/ML injection 4 mg (4 mg Intravenous Given 07/25/22 2043)    ED Course/ Medical Decision Making/ A&P                             Medical Decision Making Amount and/or Complexity of Data Reviewed Labs: ordered. Radiology: ordered.  Risk OTC drugs. Prescription drug management. Decision regarding hospitalization.    This patient presents to the ED for concern of chest discomfort, this involves an extensive number of treatment options, and is a complaint that carries with it a high risk of complications and morbidity.  The differential diagnosis includes coronary syndrome, PE, could be related to an esophageal injury from the procedure that seems less likely given the 24 hours since procedure that he had the onset.  He has known coronary disease which raises significant concern for recurrent obstructive disease   Co morbidities that complicate the patient evaluation  Hypertension, elderly, known coronary disease   Additional history obtained:  Additional history obtained from medical record External records from outside source obtained and reviewed including prior echocardiogram and left heart catheterizations.   Lab Tests:  I Ordered, and personally interpreted labs.  The pertinent results include:  normal trop, CBC and BMP without acute findings, BUN and creatinine are slightly up from prior but not in any significant abnormal range, mild hyperglycemia   Imaging Studies ordered:  I ordered imaging studies including chest xray  I independently visualized and interpreted imaging which showed no acute findings  I agree with the  radiologist interpretation   Cardiac Monitoring: / EKG:  The patient was maintained on a cardiac monitor.  I personally viewed and interpreted the cardiac monitored which showed an underlying rhythm of: Borderline normal sinus rhythm heart rate of around 60, no ischemia, right bundle branch block, consistent with prior   Consultations Obtained:  I requested consultation with the hospitalist - Dr. Carren Rang,  and discussed lab and imaging findings as well as pertinent plan - they recommend: admit   Problem List / ED Course / Critical interventions / Medication management  Ongoing chest pain which seems to be fluctuating, improved with medicines but high risk for coronary disease, will need to be admitted I have reviewed the patients home medicines and have made adjustments as needed   Social Determinants of Health:  Known coronary disease, elderly   Test / Admission - Considered:  Will admit         Final Clinical Impression(s) / ED Diagnoses Final diagnoses:  Unstable angina [facility])    Rx / DC Orders ED Discharge Orders     None         Eber Hong, MD 07/25/22 2143

## 2022-07-25 NOTE — Plan of Care (Signed)

## 2022-07-26 ENCOUNTER — Observation Stay (HOSPITAL_COMMUNITY): Payer: Medicare HMO

## 2022-07-26 ENCOUNTER — Encounter (HOSPITAL_COMMUNITY): Payer: Self-pay | Admitting: Family Medicine

## 2022-07-26 DIAGNOSIS — R079 Chest pain, unspecified: Secondary | ICD-10-CM | POA: Diagnosis not present

## 2022-07-26 DIAGNOSIS — I25119 Atherosclerotic heart disease of native coronary artery with unspecified angina pectoris: Secondary | ICD-10-CM | POA: Diagnosis not present

## 2022-07-26 DIAGNOSIS — I517 Cardiomegaly: Secondary | ICD-10-CM | POA: Diagnosis not present

## 2022-07-26 DIAGNOSIS — K8001 Calculus of gallbladder with acute cholecystitis with obstruction: Secondary | ICD-10-CM | POA: Diagnosis not present

## 2022-07-26 DIAGNOSIS — I4891 Unspecified atrial fibrillation: Secondary | ICD-10-CM

## 2022-07-26 DIAGNOSIS — K7689 Other specified diseases of liver: Secondary | ICD-10-CM | POA: Diagnosis not present

## 2022-07-26 DIAGNOSIS — R0789 Other chest pain: Secondary | ICD-10-CM

## 2022-07-26 DIAGNOSIS — R935 Abnormal findings on diagnostic imaging of other abdominal regions, including retroperitoneum: Secondary | ICD-10-CM | POA: Diagnosis not present

## 2022-07-26 DIAGNOSIS — E782 Mixed hyperlipidemia: Secondary | ICD-10-CM

## 2022-07-26 DIAGNOSIS — N179 Acute kidney failure, unspecified: Secondary | ICD-10-CM | POA: Diagnosis not present

## 2022-07-26 DIAGNOSIS — I3139 Other pericardial effusion (noninflammatory): Secondary | ICD-10-CM | POA: Diagnosis not present

## 2022-07-26 DIAGNOSIS — I088 Other rheumatic multiple valve diseases: Secondary | ICD-10-CM | POA: Diagnosis not present

## 2022-07-26 DIAGNOSIS — K760 Fatty (change of) liver, not elsewhere classified: Secondary | ICD-10-CM | POA: Diagnosis not present

## 2022-07-26 DIAGNOSIS — I5030 Unspecified diastolic (congestive) heart failure: Secondary | ICD-10-CM

## 2022-07-26 DIAGNOSIS — I10 Essential (primary) hypertension: Secondary | ICD-10-CM | POA: Diagnosis not present

## 2022-07-26 DIAGNOSIS — N281 Cyst of kidney, acquired: Secondary | ICD-10-CM | POA: Diagnosis not present

## 2022-07-26 DIAGNOSIS — R7989 Other specified abnormal findings of blood chemistry: Secondary | ICD-10-CM | POA: Insufficient documentation

## 2022-07-26 DIAGNOSIS — E1122 Type 2 diabetes mellitus with diabetic chronic kidney disease: Secondary | ICD-10-CM | POA: Diagnosis not present

## 2022-07-26 DIAGNOSIS — I480 Paroxysmal atrial fibrillation: Secondary | ICD-10-CM

## 2022-07-26 LAB — CBC
HCT: 35 % — ABNORMAL LOW (ref 39.0–52.0)
Hemoglobin: 11.4 g/dL — ABNORMAL LOW (ref 13.0–17.0)
MCH: 30.2 pg (ref 26.0–34.0)
MCHC: 32.6 g/dL (ref 30.0–36.0)
MCV: 92.8 fL (ref 80.0–100.0)
Platelets: 134 10*3/uL — ABNORMAL LOW (ref 150–400)
RBC: 3.77 MIL/uL — ABNORMAL LOW (ref 4.22–5.81)
RDW: 15.2 % (ref 11.5–15.5)
WBC: 4.2 10*3/uL (ref 4.0–10.5)
nRBC: 0 % (ref 0.0–0.2)

## 2022-07-26 LAB — ECHOCARDIOGRAM COMPLETE
AR max vel: 2.01 cm2
AV Area VTI: 1.94 cm2
AV Area mean vel: 1.81 cm2
AV Mean grad: 11 mmHg
AV Peak grad: 18.8 mmHg
Ao pk vel: 2.17 m/s
Area-P 1/2: 1.71 cm2
Height: 71 in
MV M vel: 1.49 m/s
MV Peak grad: 8.9 mmHg
S' Lateral: 2.1 cm
Weight: 3291.03 oz

## 2022-07-26 LAB — GLUCOSE, CAPILLARY
Glucose-Capillary: 103 mg/dL — ABNORMAL HIGH (ref 70–99)
Glucose-Capillary: 118 mg/dL — ABNORMAL HIGH (ref 70–99)
Glucose-Capillary: 159 mg/dL — ABNORMAL HIGH (ref 70–99)
Glucose-Capillary: 140 mg/dL — ABNORMAL HIGH (ref 70–99)

## 2022-07-26 LAB — COMPREHENSIVE METABOLIC PANEL
ALT: 585 U/L — ABNORMAL HIGH (ref 0–44)
AST: 888 U/L — ABNORMAL HIGH (ref 15–41)
Albumin: 3.5 g/dL (ref 3.5–5.0)
Alkaline Phosphatase: 149 U/L — ABNORMAL HIGH (ref 38–126)
Anion gap: 6 (ref 5–15)
BUN: 21 mg/dL (ref 8–23)
CO2: 23 mmol/L (ref 22–32)
Calcium: 9 mg/dL (ref 8.9–10.3)
Chloride: 106 mmol/L (ref 98–111)
Creatinine, Ser: 0.88 mg/dL (ref 0.61–1.24)
GFR, Estimated: >60 mL/min (ref >60)
Glucose, Bld: 165 mg/dL — ABNORMAL HIGH (ref 70–99)
Potassium: 4 mmol/L (ref 3.5–5.1)
Sodium: 135 mmol/L (ref 135–145)
Total Bilirubin: 1.5 mg/dL — ABNORMAL HIGH (ref 0.3–1.2)
Total Protein: 6.1 g/dL — ABNORMAL LOW (ref 6.5–8.1)

## 2022-07-26 LAB — MAGNESIUM: Magnesium: 2.4 mg/dL (ref 1.7–2.4)

## 2022-07-26 MED ORDER — MELATONIN 3 MG PO TABS
6.0000 mg | ORAL_TABLET | Freq: Once | ORAL | Status: AC
  Administered 2022-07-26: 6 mg via ORAL
  Filled 2022-07-26: qty 2

## 2022-07-26 MED ORDER — FENTANYL CITRATE PF 50 MCG/ML IJ SOSY: 25.0000 ug | PREFILLED_SYRINGE | INTRAMUSCULAR | Status: DC | PRN

## 2022-07-26 MED ORDER — METOPROLOL SUCCINATE ER 25 MG PO TB24
25.0000 mg | ORAL_TABLET | Freq: Every day | ORAL | Status: DC
  Administered 2022-07-27 – 2022-07-29 (×3): 25 mg via ORAL
  Filled 2022-07-26 (×3): qty 1

## 2022-07-26 MED ORDER — HEPARIN SODIUM (PORCINE) 5000 UNIT/ML IJ SOLN
5000.0000 [IU] | Freq: Three times a day (TID) | INTRAMUSCULAR | Status: AC
  Administered 2022-07-26 – 2022-07-27 (×5): 5000 [IU] via SUBCUTANEOUS
  Filled 2022-07-26 (×5): qty 1

## 2022-07-26 MED ORDER — ASPIRIN 81 MG PO TBEC
81.0000 mg | DELAYED_RELEASE_TABLET | Freq: Every day | ORAL | Status: DC
  Administered 2022-07-27: 81 mg via ORAL
  Filled 2022-07-26: qty 1

## 2022-07-26 MED ORDER — SODIUM CHLORIDE 0.9 % IV SOLN
2.0000 g | INTRAVENOUS | Status: DC
  Administered 2022-07-26 – 2022-07-27 (×2): 2 g via INTRAVENOUS
  Filled 2022-07-26 (×2): qty 20

## 2022-07-26 MED ORDER — GADOBUTROL 1 MMOL/ML IV SOLN
7.0000 mL | Freq: Once | INTRAVENOUS | Status: AC | PRN
  Administered 2022-07-26: 7 mL via INTRAVENOUS

## 2022-07-26 MED ORDER — SODIUM CHLORIDE 0.9 % IV SOLN: INTRAVENOUS | Status: DC

## 2022-07-26 MED ORDER — INSULIN ASPART 100 UNIT/ML IJ SOLN
0.0000 [IU] | Freq: Three times a day (TID) | INTRAMUSCULAR | Status: DC
  Administered 2022-07-26: 3 [IU] via SUBCUTANEOUS
  Administered 2022-07-27: 2 [IU] via SUBCUTANEOUS
  Administered 2022-07-27: 5 [IU] via SUBCUTANEOUS
  Administered 2022-07-28 – 2022-07-29 (×2): 3 [IU] via SUBCUTANEOUS

## 2022-07-26 MED ORDER — HYDRALAZINE HCL 25 MG PO TABS
100.0000 mg | ORAL_TABLET | Freq: Three times a day (TID) | ORAL | Status: DC
  Administered 2022-07-26 – 2022-07-29 (×7): 100 mg via ORAL
  Filled 2022-07-26 (×7): qty 4

## 2022-07-26 MED ORDER — METRONIDAZOLE 500 MG/100ML IV SOLN
500.0000 mg | Freq: Two times a day (BID) | INTRAVENOUS | Status: DC
  Administered 2022-07-26 – 2022-07-28 (×4): 500 mg via INTRAVENOUS
  Filled 2022-07-26 (×4): qty 100

## 2022-07-26 MED ORDER — OXYCODONE HCL 5 MG PO TABS: 5.0000 mg | ORAL_TABLET | ORAL | Status: DC | PRN

## 2022-07-26 NOTE — H&P (Signed)
History and Physical    Patient: [name] [name] ZOX:[id number] DOB: June 06, 1940 DOA: 07/25/2022 DOS: the patient was seen and examined on 07/26/2022 PCP: Richardean Chimera, MD  Patient coming from: Home  Chief Complaint:  Chief Complaint  Patient presents with   Chest Pain   HPI: [name] [name] is a 82 y.o. male with medical history significant of anemia, coronary artery disease, diabetes mellitus type 2, GERD, hypertension, NSTEMI, paroxysmal atrial fibrillation, and more presents the ED with a chief complaint of severe chest pain.  This started around 430 or 5 PM on April 30.  He reports he was sitting on the sofa when it started.  It felt like a pressure, burning, aching in this center of his chest.  He was not short of breath did not have palpitations.  He did have diaphoresis and nausea.  His pain radiated through to his back.  He drank some Sprite and took some Mylanta thinking it could be gas.  The pain was unchanged with exertion, and unchanged with this attempted treatment with Sprite.  Patient reports the pain gradually went away on its own after being in the ER for about an hour.  They given chewable aspirin in the ER he reports it had no effect on the pain.  He did not give him nitro.  He reports he given morphine but the pain was almost gone anyways by the time he got the morphine.  He denies any fever.  He did have a colonoscopy and an EGD the day before.  The day of he felt fine before the pain came on and walked 2 miles.  It is normal for him to walk about 2 miles per day.  Patient has no other complaints at this time.  Patient does not smoke, does not drink.  He is vaccinated for COVID.  Patient is full code. Review of Systems: As mentioned in the history of present illness. All other systems reviewed and are negative. Past Medical History:  Diagnosis Date   Anemia    Arthritis    CAD (coronary artery disease)    DES to circumflex 12/2017   Colon cancer (HCC) 2023   Diabetes  mellitus without complication (HCC)    Dysrhythmia    GERD (gastroesophageal reflux disease)    Glaucoma    Gout    Headache    History of kidney stones    Hypertension    NSTEMI (non-ST elevated myocardial infarction) (HCC) 12/31/2017   Paroxysmal atrial fibrillation (HCC)    Pericardial effusion    Idiopathic, recurrent pericardial effusion s/p pericardial window.   Prostate cancer (HCC) 2007   S/P Seed implants    Supraventricular tachycardia    Temporal arteritis [facility])    Past Surgical History:  Procedure Laterality Date   BIOPSY  10/12/2020   Procedure: BIOPSY;  Surgeon: Dolores Frame, MD;  [facility]: [facility];  Service: Gastroenterology;;   BIOPSY  02/25/2021   Procedure: BIOPSY;  Surgeon: Dolores Frame, MD;  [facility]: [facility];  Service: Gastroenterology;;   BIOPSY  04/11/2021   Procedure: BIOPSY;  Surgeon: Lemar Lofty., MD;  Location: [facility];  Service: Gastroenterology;;   CATARACT EXTRACTION W/PHACO Left 06/07/2015   Procedure: CATARACT EXTRACTION PHACO AND INTRAOCULAR LENS PLACEMENT LEFT EYE CDE=8.00;  Surgeon: Gemma Payor, MD;  Location: [facility];  Service: Ophthalmology;  Laterality: Left;   CATARACT EXTRACTION W/PHACO Right 06/17/2015   Procedure: CATARACT EXTRACTION PHACO AND INTRAOCULAR LENS PLACEMENT RIGHT EYE CDE=11.09;  Surgeon: Gemma Payor, MD;  Location: [facility];  Service: Ophthalmology;  Laterality: Right;   COLONOSCOPY WITH PROPOFOL N/A 10/12/2020   Procedure: COLONOSCOPY WITH PROPOFOL;  Surgeon: Dolores Frame, MD;  [facility];  Service: Gastroenterology;  Laterality: N/A;  10:35   COLONOSCOPY WITH PROPOFOL N/A 02/25/2021   Procedure: COLONOSCOPY WITH PROPOFOL;  Surgeon: Dolores Frame, MD;  [facility];  Service: Gastroenterology;  Laterality: N/A;  8:10   CORONARY ANGIOPLASTY WITH STENT PLACEMENT  01/01/2018   CORONARY STENT INTERVENTION N/A 01/01/2018   Procedure:  CORONARY STENT INTERVENTION;  Surgeon: Corky Crafts, MD;  [facility]: [facility];  Service: Cardiovascular;  Laterality: N/A;   ENDOSCOPIC MUCOSAL RESECTION N/A 04/11/2021   Procedure: ENDOSCOPIC MUCOSAL RESECTION;  Surgeon: Meridee Score Netty Starring., MD;  Location: [facility];  Service: Gastroenterology;  Laterality: N/A;   ENTEROSCOPY N/A 10/12/2020   Procedure: PUSH ENTEROSCOPY;  Surgeon: Dolores Frame, MD;  [facility];  Service: Gastroenterology;  Laterality: N/A;   ENTEROSCOPY N/A 04/11/2021   Procedure: ENTEROSCOPY;  Surgeon: Meridee Score Netty Starring., MD;  Location: [facility];  Service: Gastroenterology;  Laterality: N/A;   ESOPHAGOGASTRODUODENOSCOPY (EGD) WITH PROPOFOL N/A 10/12/2020   Procedure: ESOPHAGOGASTRODUODENOSCOPY (EGD) WITH PROPOFOL;  Surgeon: Dolores Frame, MD;  [facility];  Service: Gastroenterology;  Laterality: N/A;   FRACTURE SURGERY Left 2010   ankle   HEMOSTASIS CLIP PLACEMENT  04/11/2021   Procedure: HEMOSTASIS CLIP PLACEMENT;  Surgeon: Lemar Lofty., MD;  Location: [facility];  Service: Gastroenterology;;   INSERTION PROSTATE RADIATION SEED  2007   KNEE ARTHROSCOPY Left    LAPAROSCOPY N/A 05/22/2021   Procedure: LAPAROSCOPY DIAGNOSTIC WITH WASHOUT OF ABDOMEN;  Surgeon: Romie Levee, MD;  Location: [facility];  Service: General;  Laterality: N/A;   LEFT HEART CATH AND CORONARY ANGIOGRAPHY N/A 01/01/2018   Procedure: LEFT HEART CATH AND CORONARY ANGIOGRAPHY;  Surgeon: Corky Crafts, MD;  [facility]: [facility];  Service: Cardiovascular;  Laterality: N/A;   ORIF TIBIA & FIBULA FRACTURES Left 2003   Distal tibial/fibula    PERICARDIAL WINDOW  02/2007   PERICARDIOCENTESIS  2007   hx/notes 10/19/2011   POLYPECTOMY  10/12/2020   Procedure: POLYPECTOMY;  Surgeon: Dolores Frame, MD;  [facility];  Service: Gastroenterology;;  small bowel, cecal   POLYPECTOMY  02/25/2021    Procedure: POLYPECTOMY;  Surgeon: Dolores Frame, MD;  [facility];  Service: Gastroenterology;;  transverse colon x2   SUBMUCOSAL LIFTING INJECTION  04/11/2021   Procedure: SUBMUCOSAL LIFTING INJECTION;  Surgeon: Lemar Lofty., MD;  Location: [facility];  Service: Gastroenterology;;   SUBMUCOSAL TATTOO INJECTION  04/11/2021   Procedure: SUBMUCOSAL TATTOO INJECTION;  Surgeon: Lemar Lofty., MD;  Location: [facility];  Service: Gastroenterology;;   Social History:  reports that he quit smoking about 56 years ago. His smoking use included cigarettes. He started smoking about 64 years ago. He has a 3.00 pack-year smoking history. He has never used smokeless tobacco. He reports that he does not currently use alcohol. He reports that he does not use drugs.  Allergies  Allergen Reactions   Amiodarone Rash    Family History  Problem Relation Age of Onset   CAD Mother        MI in 48s   Prostate cancer Father    Prostate cancer Brother    Lung cancer Brother     Prior to Admission medications  Medication Sig Start Date End Date Taking? Authorizing Provider  acetaminophen (TYLENOL) 325 MG tablet Take 650 mg by mouth every 4 (four) hours as needed (pain or fever).   Yes [provider]  allopurinol (ZYLOPRIM) 300 MG tablet Take 450 mg by mouth in the morning. 02/07/22  Yes [provider]  aluminum-magnesium hydroxide 200-200 MG/5ML suspension Take 5 mLs by mouth every 6 (six) hours as needed for indigestion.   Yes [provider]  apixaban (ELIQUIS) 5 MG TABS tablet Take 1 tablet (5 mg total) by mouth 2 (two) times daily for 2 days. 07/24/22 07/26/22 Yes Mansouraty, Netty Starring., MD  Ascorbic Acid (VITAMIN C) 1000 MG tablet Take 1,000 mg by mouth 2 (two) times daily.   Yes [provider]  atorvastatin (LIPITOR) 80 MG tablet TAKE 1 TABLET BY MOUTH ONCE DAILY AT  6  PM Patient taking differently: Take 80 mg by mouth  daily. 12/01/21  Yes Jonelle Sidle, MD  benazepril (LOTENSIN) 40 MG tablet TAKE 1 TABLET BY MOUTH ONCE DAILY *NEEDS  OFFICE  VISIT* Patient taking differently: Take 40 mg by mouth daily. 12/13/21  Yes Jonelle Sidle, MD  cyanocobalamin 1000 MCG tablet Take 1 tablet (1,000 mcg total) by mouth daily. 11/22/21  Yes Kathlen Mody, MD  diltiazem (CARDIZEM CD) 240 MG 24 hr capsule Take 240 mg by mouth daily.   Yes [provider]  dorzolamide (TRUSOPT) 2 % ophthalmic solution Place 1 drop into both eyes 2 (two) times daily. 03/30/21  Yes [provider]  FERREX 150 150 MG capsule Take 150 mg by mouth 2 (two) times daily. 01/23/20  Yes [provider]  hydrALAZINE (APRESOLINE) 100 MG tablet Take 100 mg in the morning, 50 mg midday, and 100 mg at bedtime Patient taking differently: Take 50-100 mg by mouth See admin instructions. Take 100 mg in the morning, 50 mg midday, and 100 mg at bedtime 05/24/22  Yes Jonelle Sidle, MD  latanoprost (XALATAN) 0.005 % ophthalmic solution Place 1 drop into both eyes at bedtime. 01/22/20  Yes [provider]  loperamide (IMODIUM) 2 MG capsule Take 2 mg by mouth as needed for diarrhea or loose stools.   Yes [provider]  metFORMIN (GLUCOPHAGE) 500 MG tablet Take 500 mg by mouth 2 (two) times daily. 03/14/22  Yes [provider]  metoprolol succinate (TOPROL-XL) 50 MG 24 hr tablet Take 50 mg by mouth daily. Take with or immediately following a meal.   Yes [provider]  Multiple Vitamin (MULTIVITAMIN) tablet Take 1 tablet by mouth in the morning.   Yes [provider]  pantoprazole (PROTONIX) 40 MG tablet Take 1 tablet by mouth once daily 06/14/22  Yes Jonelle Sidle, MD  prochlorperazine (COMPAZINE) 10 MG tablet Take 1 tablet (10 mg total) by mouth every 6 (six) hours as needed for nausea or vomiting. 06/23/21  Yes Doreatha Massed, MD  spironolactone (ALDACTONE) 25 MG tablet Take 0.5  tablets (12.5 mg total) by mouth daily. 06/09/22  Yes Jonelle Sidle, MD  [facility] ULTRA test strip USE 1 STRIP TO CHECK GLUCOSE ONCE DAILY 06/06/21   [provider]  polyethylene glycol-electrolytes (NULYTELY) 420 g solution Take 4,000 mLs by mouth once. Patient not taking: Reported on 07/25/2022 06/01/22   [provider]    Physical Exam: Vitals:   07/25/22 2200 07/25/22 2245 07/26/22 0000 07/26/22 0146  BP: (!) 124/58 (!) 153/67 (!) 153/67 (!) 128/56  Pulse: (!) 56 (!) 52 Marland Kitchen)  56 (!) 48  Resp: 13 16 18 15   Temp:  97.8 F (36.6 C) 97.8 F (36.6 C) 97.8 F (36.6 C)  TempSrc:  Oral Oral Oral  SpO2:  97% 97% 96%  Weight:  93.3 kg 93.3 kg   Height:   5\' 11"  (1.803 m)    1.  General: Patient lying supine in bed,  no acute distress   2. Psychiatric: Alert and oriented x 3, mood and behavior normal for situation, pleasant and cooperative with exam   3. Neurologic: Speech and language are normal, face is symmetric, moves all 4 extremities voluntarily, at baseline without acute deficits on limited exam   4. HEENMT:  Head is atraumatic, normocephalic, pupils reactive to light, neck is supple, trachea is midline, mucous membranes are moist   5. Respiratory : Lungs are clear to auscultation bilaterally without wheezing, rhonchi, rales, no cyanosis, no increase in work of breathing or accessory muscle use   6. Cardiovascular : Heart rate normal, rhythm is regular, rubs or gallops, no peripheral edema, peripheral pulses palpated   7. Gastrointestinal:  Abdomen is soft, nondistended, nontender to palpation bowel sounds active, no masses or organomegaly palpated   8. Skin:  Skin is warm, dry and intact without rashes, acute lesions, or ulcers on limited exam   9.Musculoskeletal:  No acute deformities or trauma, no asymmetry in tone, no peripheral edema, peripheral pulses palpated, no tenderness to palpation in the extremities  Data Reviewed: In the ED Temp 97.9,  heart rate 57, respiratory 13, blood pressure 166/62, satting 98% No leukocytosis with white blood cell count of 8.2 Chemistry is unremarkable aside from a slight bump in creatinine to 1.23 EKG shows a sinus rhythm, QTc 436, heart rate 58 Chest x-ray shows no acute cardiopulmonary process Patient was given morphine and aspirin in the ED Troponin normal x 2 Admission requested for chest pain workup  Assessment and Plan: * Chest pain -Trop normal times 2 -No acute ischemic changes on EKG -Echo in the AM -Cards consult -Continue to monitor on tele  AKI (acute kidney injury) (HCC) -Cr increased to 1.23 from 0.87 -2//2 dehydration -BUN 24:1.23 -Avoid nephrotoxic agents when possible -Continue fluids -Recheck in the AM  Type 2 diabetes mellitus with diabetic chronic kidney disease (HCC) -sliding scale coverage  Essential hypertension -Continue ACE, diltiazem, hydralazine, metoprolol, and spironalactone -Continue to monitor  CAD (coronary artery disease) -continue statin, ACE, beta blocker -Trop normal times two -EKG without acute ischemic changes -Monitor on tele    Mixed hyperlipidemia -continue statin  PAF (paroxysmal atrial fibrillation) (HCC) -continue diltiazem and eliquis      Advance Care Planning:   Code Status: Full Code  Consults: Cardiology  Family Communication: Wife at bedside  Severity of Illness: The appropriate patient status for this patient is OBSERVATION. Observation status is judged to be reasonable and necessary in order to provide the required intensity of service to ensure the patient's safety. The patient's presenting symptoms, physical exam findings, and initial radiographic and laboratory data in the context of their medical condition is felt to place them at decreased risk for further clinical deterioration. Furthermore, it is anticipated that the patient will be medically stable for discharge from the hospital within 2 midnights of  admission.   Author: Lilyan Gilford, DO 07/26/2022 6:11 AM  For on call review [email or web address].

## 2022-07-26 NOTE — Assessment & Plan Note (Signed)
-  sliding scale coverage

## 2022-07-26 NOTE — Assessment & Plan Note (Signed)
-  Trop normal times 2 -No acute ischemic changes on EKG -Echo in the AM -Cards consult -Continue to monitor on tele

## 2022-07-26 NOTE — Assessment & Plan Note (Signed)
-  Cr increased to 1.23 from 0.87 -2//2 dehydration -BUN 24:1.23 -Avoid nephrotoxic agents when possible -Continue fluids -Recheck in the AM

## 2022-07-26 NOTE — Progress Notes (Addendum)
PROGRESS NOTE     [name] [name], is a 82 y.o. male, DOB - 1940/08/18, YNW:[id number]  Admit date - 07/25/2022   Admitting Physician Lilyan Gilford, DO  Outpatient Primary MD for the patient is Richardean Chimera, MD  LOS - 0  Chief Complaint  Patient presents with   Chest Pain        Brief Narrative:  82 y.o. male with a hx of CAD (s/p DES to LCx in 12/2017), PAFib, HTN, HLD, Type 2 DM, prior pericardial effusion requiring a pericardial window, history of colon cancer (s/p partial colectomy) and history of prostate cancer was admitted on 07/26/2022 with the acute elevation of LFTs with substernal and epigastric pain associated with nausea after eating spaghetti with greasy meat sauce (minced meat)  Problem  Cholelithiasis and Acute Cholecystitis With Obstruction  Abnormal LFTs--Gallstone Related  Paf (Paroxysmal Atrial Fibrillation) (Hcc)  Uncontrolled Type 2 Diabetes Mellitus With Hyperglycemia, Without Long-Term Current Use of Insulin (Hcc)  Type 2 Diabetes Mellitus With Diabetic Chronic Kidney Disease (Hcc)  Chest Pain  Thrombocytopenia (Hcc)   -Assessment and Plan: 1)Cholelithiasis with Acute Cholecystitis with Obstruction---patient presented with substernal and epigastric pain associated with nausea after eating spaghetti with greasy meat sauce (minced meat) -Right upper quadrant ultrasound and MRCP noted with cholelithiasis and possible stone in the cystic duct but no biliary dilatation -Imaging and clinical findings of acute cholecystitis noted -Clinically No evidence for ascending cholangitis at this time -General surgery consult appreciated -IV Rocephin/Flagyl for now -As needed antiemetics -As needed opiates - liquid diet -Hold Eliquis to allow for possible lap chole -Last dose of Eliquis was p.m. of 07/25/2022 (patient was off Eliquis for 3 days from 07/22/22 through 07/24/2022 to allow for EGD and colonoscopy -Gentle IV fluids  2)PAF -stable -Severely dilated left  atrium  -Mild aortic valve stenosis noted, no mitral stenosis -continue Cardizem and metoprolol for rate control -Hold Eliquis as above #1  3)DM2-  -Prior A1c 6.0 reflecting excellent diabetic control PTA  -hold metformin Use Novolog/Humalog Sliding scale insulin with Accu-Cheks/Fingersticks as ordered   4)Atypical chest Pain/H/o CAD with Prior Stents--- -Patient was ruled out for ACS by cardiac enzymes and EKG -On further evaluation patient actually had substernal/epigastric pain radiating to his back associated with nausea after eating a greasy meal---workup consistent with cholelithiasis with acute cholecystitis -Cardiology consult and input appreciated -Echo with EF of 30 to 35%, with grade 1 diastolic dysfunction and severe LVH, no wall motion abnormalities Hold Lipitor due to elevated LFTs  5)Elevated LFTs---  AST is 888 and ALF is 585, T. bili is 1.5 , alk phos 149  -LFTs has been previously checked 4 times in the last 6 months and was Normal each time until Now -Suspect this is related to gallstones as above #1 -Hold statin as above -Trend LFTs  6)HTN -Continue Aldactone, benazepril, Cardizem, metoprolol, hydralazine  7) chronic anemia and thrombocytopenia--- Hgb and platelets close to baseline at this time- --no acute bleeding concerns -Eliquis currently on hold due to potential for lap chole  NB!! Total care time over 53 minutes including time spent coordinating care with cardiology and general surgery teams  Status is: Inpatient   Disposition: The patient is from: Home              Anticipated d/c is to: Home              Anticipated d/c date is: > 3 days  Patient currently is not medically stable to d/c. Barriers: Not Clinically Stable-   Code Status :  -  Code Status: Full Code   Family Communication:    Discussed with wife at bedside  DVT Prophylaxis  :   - SCDs   heparin injection 5,000 Units Start: 07/26/22 1400 SCDs Start: 07/25/22  2302  Lab Results  Component Value Date   PLT 134 (L) 07/26/2022   Inpatient Medications  Scheduled Meds:  [START ON 07/27/2022] aspirin EC  81 mg Oral Q breakfast   benazepril  40 mg Oral Daily   diltiazem  240 mg Oral Daily   heparin injection (subcutaneous)  5,000 Units Subcutaneous Q8H   hydrALAZINE  100 mg Oral BID   hydrALAZINE  50 mg Oral Q24H   insulin aspart  0-15 Units Subcutaneous TID WC   iron polysaccharides  150 mg Oral BID   [START ON 07/27/2022] metoprolol succinate  25 mg Oral Daily   pantoprazole  40 mg Oral Daily   spironolactone  12.5 mg Oral Daily   Continuous Infusions:  sodium chloride 75 mL/hr at 07/25/22 2347   cefTRIAXone (ROCEPHIN)  IV 2 g (07/26/22 1512)   PRN Meds:.acetaminophen, nitroGLYCERIN, ondansetron (ZOFRAN) IV  Anti-infectives (From admission, onward)    Start     Dose/Rate Route Frequency Ordered Stop   07/26/22 1500  cefTRIAXone (ROCEPHIN) 2 g in sodium chloride 0.9 % 100 mL IVPB        2 g 200 mL/hr over 30 Minutes Intravenous Every 24 hours 07/26/22 1417        Subjective: [name] [name] today has no fevers,    No chest pain,    No nausea but no emesis -At bedside, questions answered  Objective: Vitals:   07/26/22 0000 07/26/22 0146 07/26/22 0915 07/26/22 1323  BP: (!) 153/67 (!) 128/56 (!) 148/58 (!) 140/48  Pulse: (!) 56 (!) 48 (!) 48 (!) 49  Resp: 18 15 11 16   Temp: 97.8 F (36.6 C) 97.8 F (36.6 C) 97.7 F (36.5 C) 97.7 F (36.5 C)  TempSrc: Oral Oral Oral Oral  SpO2: 97% 96% 100% 96%  Weight: 93.3 kg     Height: 5\' 11"  (1.803 m)       Intake/Output Summary (Last 24 hours) at 07/26/2022 1748 Last data filed at 07/26/2022 1700 Gross per 24 hour  Intake 240 ml  Output --  Net 240 ml   Filed Weights   07/25/22 2245 07/26/22 0000  Weight: 93.3 kg 93.3 kg   Physical Exam  Gen:- Awake Alert,  in no apparent distress  HEENT:- [place].AT, No significant sclera icterus Ears--HOH Neck-Supple Neck,No JVD,.  Lungs-  CTAB ,  fair symmetrical air movement CV- S1, S2 normal, regular  Abd-  +ve B.Sounds, Abd Soft, epigastric and right upper quadrant discomfort, no rebound or guarding,  Extremity/Skin:- No  edema, pedal pulses present  Psych-affect is appropriate, oriented x3 Neuro-no new focal deficits, no tremors  Data Reviewed: I have personally reviewed following labs and imaging studies  CBC: Recent Labs  Lab 07/25/22 2031 07/26/22 0411  WBC 8.2 4.2  NEUTROABS 6.6  --   HGB 12.1* 11.4*  HCT 36.6* 35.0*  MCV 92.0 92.8  PLT 147* 134*   Basic Metabolic Panel: Recent Labs  Lab 07/25/22 2031 07/26/22 0411  NA 134* 135  K 4.1 4.0  CL 105 106  CO2 22 23  GLUCOSE 193* 165*  BUN 24* 21  CREATININE 1.23 0.88  CALCIUM 9.3 9.0  MG  --  2.4   GFR: Estimated Creatinine Clearance: 76.8 mL/min (by C-G formula based on SCr of 0.88 mg/dL). Liver Function Tests: Recent Labs  Lab 07/26/22 0411  AST 888*  ALT 585*  ALKPHOS 149*  BILITOT 1.5*  PROT 6.1*  ALBUMIN 3.5   Radiology Studies: MR ABDOMEN MRCP W WO CONTAST  Result Date: 07/26/2022 CLINICAL DATA:  Jaundice. Gallbladder wall thickening and edema. History of colon cancer. EXAM: MRI ABDOMEN WITHOUT AND WITH CONTRAST (INCLUDING MRCP) TECHNIQUE: Multiplanar multisequence MR imaging of the abdomen was performed both before and after the administration of intravenous contrast. Heavily T2-weighted images of the biliary and pancreatic ducts were obtained, and three-dimensional MRCP images were rendered by post processing. CONTRAST:  7mL GADAVIST GADOBUTROL 1 MMOL/ML IV SOLN COMPARISON:  None Available. FINDINGS: Lower chest: Trace pleural fluid. There is a small pericardial effusion with a more prominent component towards the left side of the heart. Heart is slightly enlarged. Hepatobiliary: The liver of several small bright T2, low T1 nonenhancing foci consistent with benign cysts. Many are under a cm and too small to completely characterize but are  nonaggressive and likely benign cystic lesions or hamartomas. There is one lesion which is bright on T2 with progressive nodular enhancement in segment 4 consistent with a hemangioma. On series 25, image 53 this measures 2.4 x 1.4 cm. Second smaller hemangioma best seen on series 25, image 48 in segment 7. Patent portal vein. No intrahepatic biliary ductal dilatation. The common duct has a diameter approaching 3-4 mm. Normal tapering towards the pancreatic head The gallbladder is mildly distended. There is wall thickening as well as adjacent edema. Multiple stone seen towards the neck of the gallbladder. Question stone towards the cystic duct as well acute cholecystitis is possible. Pancreas: Global atrophy of the pancreas. No obvious mass. Slightly low T1 signal. Please correlate for any previous history of pancreatitis remotely. Simple appearing cyst once again in the body/neck of the pancreas measuring 13 mm. Spleen: Within normal limits in size and appearance. Adrenals/Urinary Tract: Adrenal glands are preserved. Kidneys are without enhancing mass. No collecting system dilatation. There is perinephric stranding. Small subcentimeter nonenhancing benign-appearing renal cysts are identified. No specific imaging follow-up. Stomach/Bowel: Stomach is nondilated. Moderate colonic stool. Small bowel is nondilated. Vascular/Lymphatic: Normal caliber aorta and IVC with some atherosclerotic changes. No specific abnormal lymph node enlargement seen in the visualized abdomen. Other: No ascites identified in the visualized abdomen. Motion artifact. Musculoskeletal: Scattered degenerative changes. IMPRESSION: Distended gallbladder with wall edema, thickening and adjacent fluid. Several stones identified towards the neck region and there is a possible stone towards the cystic duct. Please correlate for acute cholecystitis. No biliary ductal dilatation. Multiple hepatic and renal benign-appearing cysts. There are 2 lesions in  the liver elsewhere consistent with hemangiomas. Benign-appearing 13 mm pancreatic cyst. Recommend follow up imaging in 2 years. Motion artifact. Electronically Signed   By: Karen Kays M.D.   On: 07/26/2022 13:44   MR 3D Recon At Scanner  Result Date: 07/26/2022 CLINICAL DATA:  Jaundice. Gallbladder wall thickening and edema. History of colon cancer. EXAM: MRI ABDOMEN WITHOUT AND WITH CONTRAST (INCLUDING MRCP) TECHNIQUE: Multiplanar multisequence MR imaging of the abdomen was performed both before and after the administration of intravenous contrast. Heavily T2-weighted images of the biliary and pancreatic ducts were obtained, and three-dimensional MRCP images were rendered by post processing. CONTRAST:  7mL GADAVIST GADOBUTROL 1 MMOL/ML IV SOLN COMPARISON:  None Available. FINDINGS: Lower chest: Trace pleural fluid. There  is a small pericardial effusion with a more prominent component towards the left side of the heart. Heart is slightly enlarged. Hepatobiliary: The liver of several small bright T2, low T1 nonenhancing foci consistent with benign cysts. Many are under a cm and too small to completely characterize but are nonaggressive and likely benign cystic lesions or hamartomas. There is one lesion which is bright on T2 with progressive nodular enhancement in segment 4 consistent with a hemangioma. On series 25, image 53 this measures 2.4 x 1.4 cm. Second smaller hemangioma best seen on series 25, image 48 in segment 7. Patent portal vein. No intrahepatic biliary ductal dilatation. The common duct has a diameter approaching 3-4 mm. Normal tapering towards the pancreatic head The gallbladder is mildly distended. There is wall thickening as well as adjacent edema. Multiple stone seen towards the neck of the gallbladder. Question stone towards the cystic duct as well acute cholecystitis is possible. Pancreas: Global atrophy of the pancreas. No obvious mass. Slightly low T1 signal. Please correlate for any  previous history of pancreatitis remotely. Simple appearing cyst once again in the body/neck of the pancreas measuring 13 mm. Spleen: Within normal limits in size and appearance. Adrenals/Urinary Tract: Adrenal glands are preserved. Kidneys are without enhancing mass. No collecting system dilatation. There is perinephric stranding. Small subcentimeter nonenhancing benign-appearing renal cysts are identified. No specific imaging follow-up. Stomach/Bowel: Stomach is nondilated. Moderate colonic stool. Small bowel is nondilated. Vascular/Lymphatic: Normal caliber aorta and IVC with some atherosclerotic changes. No specific abnormal lymph node enlargement seen in the visualized abdomen. Other: No ascites identified in the visualized abdomen. Motion artifact. Musculoskeletal: Scattered degenerative changes. IMPRESSION: Distended gallbladder with wall edema, thickening and adjacent fluid. Several stones identified towards the neck region and there is a possible stone towards the cystic duct. Please correlate for acute cholecystitis. No biliary ductal dilatation. Multiple hepatic and renal benign-appearing cysts. There are 2 lesions in the liver elsewhere consistent with hemangiomas. Benign-appearing 13 mm pancreatic cyst. Recommend follow up imaging in 2 years. Motion artifact. Electronically Signed   By: Karen Kays M.D.   On: 07/26/2022 13:44   ECHOCARDIOGRAM COMPLETE  Result Date: 07/26/2022    ECHOCARDIOGRAM REPORT   Patient Name:   BRANDY KABAT Date of Exam: 07/26/2022 Medical Rec #:  [id number]     Height:       71.0 in Accession #:    [id number]    Weight:       205.7 lb Date of Birth:  28-Apr-1940     BSA:          2.134 m Patient Age:    81 years      BP:           148/58 mmHg Patient Gender: M             HR:           55 bpm. [facility] Procedure: 2D Echo, Cardiac Doppler, Color Doppler, 3D Echo and Strain Analysis Indications:    Chest Pain R07.9  History:        Patient has prior history of  Echocardiogram examinations, most                 recent 05/21/2021. CAD, Pericardial Disease and Previous                 Myocardial Infarction, Arrythmias:Atrial Fibrillation,  Signs/Symptoms:Chest Pain; Risk Factors:Dyslipidemia,                 Hypertension, Diabetes and Former Smoker.  Sonographer:    Aron Baba Referring Phys: [id number] ASIA B ZIERLE-GHOSH  Sonographer Comments: Image acquisition challenging due to respiratory motion. Global longitudinal strain was attempted. IMPRESSIONS  1. Left ventricular ejection fraction, by estimation, is 70 to 75%. The left ventricle has hyperdynamic function. The left ventricle has no regional wall motion abnormalities. There is severe left ventricular hypertrophy. Left ventricular diastolic parameters are consistent with Grade I diastolic dysfunction (impaired relaxation). Elevated left atrial pressure. The average left ventricular global longitudinal strain is -20.0 %. The global longitudinal strain is normal.  2. Right ventricular systolic function is normal. The right ventricular size is normal. Tricuspid regurgitation signal is inadequate for assessing PA pressure.  3. Left atrial size was severely dilated.  4. A small pericardial effusion is present. The pericardial effusion is circumferential. There is no evidence of cardiac tamponade.  5. The mitral valve is normal in structure. No evidence of mitral valve regurgitation. No evidence of mitral stenosis.  6. The aortic valve is tricuspid. There is moderate calcification of the aortic valve. There is moderate thickening of the aortic valve. Aortic valve regurgitation is not visualized. Mild aortic valve stenosis.  7. The inferior vena cava is normal in size with greater than 50% respiratory variability, suggesting right atrial pressure of 3 mmHg. FINDINGS  Left Ventricle: Left ventricular ejection fraction, by estimation, is 70 to 75%. The left ventricle has hyperdynamic function. The left ventricle  has no regional wall motion abnormalities. The average left ventricular global longitudinal strain is -20.0  %. The global longitudinal strain is normal. The left ventricular internal cavity size was normal in size. There is severe left ventricular hypertrophy. Left ventricular diastolic parameters are consistent with Grade I diastolic dysfunction (impaired relaxation). Elevated left atrial pressure. Right Ventricle: The right ventricular size is normal. Right vetricular wall thickness was not well visualized. Right ventricular systolic function is normal. Tricuspid regurgitation signal is inadequate for assessing PA pressure. Left Atrium: Left atrial size was severely dilated. Right Atrium: Right atrial size was normal in size. Pericardium: A small pericardial effusion is present. The pericardial effusion is circumferential. There is no evidence of cardiac tamponade. Mitral Valve: The mitral valve is normal in structure. No evidence of mitral valve regurgitation. No evidence of mitral valve stenosis. Tricuspid Valve: The tricuspid valve is normal in structure. Tricuspid valve regurgitation is trivial. No evidence of tricuspid stenosis. Aortic Valve: The aortic valve is tricuspid. There is moderate calcification of the aortic valve. There is moderate thickening of the aortic valve. There is moderate aortic valve annular calcification. Aortic valve regurgitation is not visualized. Mild aortic stenosis is present. Aortic valve mean gradient measures 11.0 mmHg. Aortic valve peak gradient measures 18.8 mmHg. Aortic valve area, by VTI measures 1.94 cm. Pulmonic Valve: The pulmonic valve was not well visualized. Pulmonic valve regurgitation is mild. No evidence of pulmonic stenosis. Aorta: The aortic root is normal in size and structure. Venous: The inferior vena cava is normal in size with greater than 50% respiratory variability, suggesting right atrial pressure of 3 mmHg. IAS/Shunts: No atrial level shunt detected by  color flow Doppler.  LEFT VENTRICLE PLAX 2D LVIDd:         3.60 cm   Diastology LVIDs:         2.10 cm   LV e' medial:    3.59 cm/s LV  PW:         1.70 cm   LV E/e' medial:  27.9 LV IVS:        1.70 cm   LV e' lateral:   3.42 cm/s LVOT diam:     2.00 cm   LV E/e' lateral: 29.2 LV SV:         106 LV SV Index:   50        2D Longitudinal Strain LVOT Area:     3.14 cm  2D Strain GLS Avg:     -20.0 %                           3D Volume EF:                          3D EF:        58 %                          LV EDV:       144 ml                          LV ESV:       61 ml                          LV SV:        84 ml RIGHT VENTRICLE RV S prime:     11.20 cm/s TAPSE (M-mode): 1.6 cm LEFT ATRIUM              Index        RIGHT ATRIUM           Index LA diam:        5.00 cm  2.34 cm/m   RA Area:     18.40 cm LA Vol (A2C):   160.0 ml 74.98 ml/m  RA Volume:   38.40 ml  18.00 ml/m LA Vol (A4C):   141.0 ml 66.08 ml/m LA Biplane Vol: 155.0 ml 72.64 ml/m  AORTIC VALVE                     PULMONIC VALVE AV Area (Vmax):    2.01 cm      PR End Diast Vel: 7.29 msec AV Area (Vmean):   1.81 cm AV Area (VTI):     1.94 cm AV Vmax:           217.00 cm/s AV Vmean:          152.500 cm/s AV VTI:            0.550 m AV Peak Grad:      18.8 mmHg AV Mean Grad:      11.0 mmHg LVOT Vmax:         139.00 cm/s LVOT Vmean:        87.900 cm/s LVOT VTI:          0.339 m LVOT/AV VTI ratio: 0.62  AORTA Ao Root diam: 3.70 cm Ao Asc diam:  3.40 cm MITRAL VALVE MV Area (PHT): 1.71 cm     SHUNTS MV Decel Time: 444 msec     Systemic VTI:  0.34 m MR Peak grad: 8.9 mmHg      Systemic Diam: 2.00 cm MR Vmax:  149.00 cm/s MV E velocity: 100.00 cm/s MV A velocity: 143.00 cm/s MV E/A ratio:  0.70 Dina Rich MD Electronically signed by Dina Rich MD Signature Date/Time: 07/26/2022/12:29:25 PM    Final    US Abdomen Limited RUQ (LIVER/GB)  Result Date: 07/26/2022 CLINICAL DATA:  Abnormal liver function tests EXAM: ULTRASOUND ABDOMEN LIMITED  RIGHT UPPER QUADRANT COMPARISON:  CT 05/15/2022 FINDINGS: Gallbladder: Distended gallbladder with wall thickening measuring up to 5 mm. Wall edema as well. Possible component of sludge as well. No shadowing stones. There is a rounded area measuring 5 mm within the gallbladder lumen without shadowing. Possible polyp or tumefactive sludge. Common bile duct: Diameter: 5 mm Liver: Mildly echogenic hepatic parenchyma. Portal vein is patent on color Doppler imaging with normal direction of blood flow towards the liver. Other: None. IMPRESSION: Mild fatty liver infiltration.  No ductal dilatation. There is gallbladder wall thickening and edema with some possible sludge. There is also a nodular focus without shadowing measuring 5 mm. Possible small gallbladder polyp. Further workup as clinically appropriate such as MRI Electronically Signed   By: Karen Kays M.D.   On: 07/26/2022 10:04   DG Chest Port 1 View  Result Date: 07/25/2022 CLINICAL DATA:  Chest pain EXAM: PORTABLE CHEST 1 VIEW COMPARISON:  Chest x-ray 11/19/2021. CT abdomen and pelvis 05/15/2022. CT chest abdomen and pelvis 11/08/2021. FINDINGS: Heart is mildly enlarged, unchanged. Small nodular density seen in the right costophrenic angle, not seen on prior CT. The lungs are otherwise clear. There is no pleural effusion or pneumothorax. No acute fractures are seen. IMPRESSION: 1. No acute cardiopulmonary process. 2. Small nodular density in the right costophrenic angle, not seen on prior CT. Follow-up chest CT recommended, non emergently. Electronically Signed   By: Darliss Cheney M.D.   On: 07/25/2022 21:26     Scheduled Meds:  [START ON 07/27/2022] aspirin EC  81 mg Oral Q breakfast   benazepril  40 mg Oral Daily   diltiazem  240 mg Oral Daily   heparin injection (subcutaneous)  5,000 Units Subcutaneous Q8H   hydrALAZINE  100 mg Oral BID   hydrALAZINE  50 mg Oral Q24H   insulin aspart  0-15 Units Subcutaneous TID WC   iron polysaccharides  150 mg  Oral BID   [START ON 07/27/2022] metoprolol succinate  25 mg Oral Daily   pantoprazole  40 mg Oral Daily   spironolactone  12.5 mg Oral Daily   Continuous Infusions:  sodium chloride 75 mL/hr at 07/25/22 2347   cefTRIAXone (ROCEPHIN)  IV 2 g (07/26/22 1512)    LOS: 0 days   Shon Hale M.D on 07/26/2022 at 5:48 PM  Go to [email or web address] - for contact info  [facility] - Office  [phone]  If 7PM-7AM, please contact night-coverage [email or web address] 07/26/2022, 5:48 PM

## 2022-07-26 NOTE — Assessment & Plan Note (Signed)
-  Continue ACE, diltiazem, hydralazine, metoprolol, and spironalactone -Continue to monitor

## 2022-07-26 NOTE — Assessment & Plan Note (Signed)
continue statin

## 2022-07-26 NOTE — Assessment & Plan Note (Signed)
-  continue diltiazem and eliquis

## 2022-07-26 NOTE — Consult Note (Signed)
Reason for Consult: suspected acute cholecystitis Referring Physician: Dr. Sheffield Slider is an 82 y.o. male.  HPI: Mr. Dragoo is an 82 y/o male with PMHx of NSTEMI, acid reflux and colon cancer who presented with substernal chest pain. Yesterday afternoon he went on a 2 mile walk before returning home. While he was at home, he experienced sudden substernal chest pain that radiated to his back. He described this pain as burning and pressure on his chest. He also experienced nausea. This chest pain lasted for about 3-4 hours before subsiding not too long after he presented to the hospital for admission. Of note, he states that the pain was reminiscent of his heart attack.   He denies any abdominal pain after eating. He denies fever or chills, SOB, or chest pain at time of examination.   Past Medical History:  Diagnosis Date   Anemia    Arthritis    CAD (coronary artery disease)    DES to circumflex 12/2017   Colon cancer (HCC) 2023   Diabetes mellitus without complication (HCC)    Dysrhythmia    GERD (gastroesophageal reflux disease)    Glaucoma    Gout    Headache    History of kidney stones    Hypertension    NSTEMI (non-ST elevated myocardial infarction) (HCC) 12/31/2017   Paroxysmal atrial fibrillation (HCC)    Pericardial effusion    Idiopathic, recurrent pericardial effusion s/p pericardial window.   Prostate cancer (HCC) 2007   S/P Seed implants    Supraventricular tachycardia    Temporal arteritis [facility])     Past Surgical History:  Procedure Laterality Date   BIOPSY  10/12/2020   Procedure: BIOPSY;  Surgeon: Dolores Frame, MD;  [facility];  Service: Gastroenterology;;   BIOPSY  02/25/2021   Procedure: BIOPSY;  Surgeon: Dolores Frame, MD;  [facility];  Service: Gastroenterology;;   BIOPSY  04/11/2021   Procedure: BIOPSY;  Surgeon: Lemar Lofty., MD;  Location: [facility];  Service: Gastroenterology;;    CATARACT EXTRACTION W/PHACO Left 06/07/2015   Procedure: CATARACT EXTRACTION PHACO AND INTRAOCULAR LENS PLACEMENT LEFT EYE CDE=8.00;  Surgeon: Gemma Payor, MD;  Location: [facility];  Service: Ophthalmology;  Laterality: Left;   CATARACT EXTRACTION W/PHACO Right 06/17/2015   Procedure: CATARACT EXTRACTION PHACO AND INTRAOCULAR LENS PLACEMENT RIGHT EYE CDE=11.09;  Surgeon: Gemma Payor, MD;  Location: [facility];  Service: Ophthalmology;  Laterality: Right;   COLONOSCOPY WITH PROPOFOL N/A 10/12/2020   Procedure: COLONOSCOPY WITH PROPOFOL;  Surgeon: Dolores Frame, MD;  [facility];  Service: Gastroenterology;  Laterality: N/A;  10:35   COLONOSCOPY WITH PROPOFOL N/A 02/25/2021   Procedure: COLONOSCOPY WITH PROPOFOL;  Surgeon: Dolores Frame, MD;  [facility];  Service: Gastroenterology;  Laterality: N/A;  8:10   CORONARY ANGIOPLASTY WITH STENT PLACEMENT  01/01/2018   CORONARY STENT INTERVENTION N/A 01/01/2018   Procedure: CORONARY STENT INTERVENTION;  Surgeon: Corky Crafts, MD;  [facility];  Service: Cardiovascular;  Laterality: N/A;   ENDOSCOPIC MUCOSAL RESECTION N/A 04/11/2021   Procedure: ENDOSCOPIC MUCOSAL RESECTION;  Surgeon: Meridee Score Netty Starring., MD;  Location: [facility];  Service: Gastroenterology;  Laterality: N/A;   ENTEROSCOPY N/A 10/12/2020   Procedure: PUSH ENTEROSCOPY;  Surgeon: Dolores Frame, MD;  [facility];  Service: Gastroenterology;  Laterality: N/A;   ENTEROSCOPY N/A 04/11/2021   Procedure: ENTEROSCOPY;  Surgeon: Meridee Score Netty Starring., MD;  Location: [facility];  Service: Gastroenterology;  Laterality: N/A;   ESOPHAGOGASTRODUODENOSCOPY (EGD) WITH PROPOFOL N/A 10/12/2020   Procedure: ESOPHAGOGASTRODUODENOSCOPY (EGD) WITH PROPOFOL;  Surgeon: Dolores Frame, MD;  [facility];  Service: Gastroenterology;  Laterality: N/A;   FRACTURE SURGERY Left 2010   ankle    HEMOSTASIS CLIP PLACEMENT  04/11/2021   Procedure: HEMOSTASIS CLIP PLACEMENT;  Surgeon: Lemar Lofty., MD;  [facility]: [facility];  Service: Gastroenterology;;   INSERTION PROSTATE RADIATION SEED  2007   KNEE ARTHROSCOPY Left    LAPAROSCOPY N/A 05/22/2021   Procedure: LAPAROSCOPY DIAGNOSTIC WITH WASHOUT OF ABDOMEN;  Surgeon: Romie Levee, MD;  Location: [facility];  Service: General;  Laterality: N/A;   LEFT HEART CATH AND CORONARY ANGIOGRAPHY N/A 01/01/2018   Procedure: LEFT HEART CATH AND CORONARY ANGIOGRAPHY;  Surgeon: Corky Crafts, MD;  [facility];  Service: Cardiovascular;  Laterality: N/A;   ORIF TIBIA & FIBULA FRACTURES Left 2003   Distal tibial/fibula    PERICARDIAL WINDOW  02/2007   PERICARDIOCENTESIS  2007   hx/notes 10/19/2011   POLYPECTOMY  10/12/2020   Procedure: POLYPECTOMY;  Surgeon: Dolores Frame, MD;  [facility];  Service: Gastroenterology;;  small bowel, cecal   POLYPECTOMY  02/25/2021   Procedure: POLYPECTOMY;  Surgeon: Dolores Frame, MD;  [facility];  Service: Gastroenterology;;  transverse colon x2   SUBMUCOSAL LIFTING INJECTION  04/11/2021   Procedure: SUBMUCOSAL LIFTING INJECTION;  Surgeon: Lemar Lofty., MD;  Location: [facility];  Service: Gastroenterology;;   SUBMUCOSAL TATTOO INJECTION  04/11/2021   Procedure: SUBMUCOSAL TATTOO INJECTION;  Surgeon: Lemar Lofty., MD;  Location: [facility];  Service: Gastroenterology;;    Family History  Problem Relation Age of Onset   CAD Mother        MI in 70s   Prostate cancer Father    Prostate cancer Brother    Lung cancer Brother     Social History:  reports that he quit smoking about 56 years ago. His smoking use included cigarettes. He started smoking about 64 years ago. He has a 3.00 pack-year smoking history. He has never used smokeless tobacco. He reports that he does not currently use alcohol. He reports that he  does not use drugs.  Allergies:  Allergies  Allergen Reactions   Amiodarone Rash    Medications: I have reviewed the patient's current medications.  Results for orders placed or performed during the hospital encounter of 07/25/22 (from the past 48 hour(s))  Troponin I (High Sensitivity)     Status: None   Collection Time: 07/25/22  8:31 PM  Result Value Ref Range   Troponin I (High Sensitivity) 11 <18 ng/L    Comment: (NOTE) Elevated high sensitivity troponin I (hsTnI) values and significant  changes across serial measurements may suggest ACS but many other  chronic and acute conditions are known to elevate hsTnI results.  Refer to the "Links" section for chest pain algorithms and additional  guidance. Performed at [facility], [address], [place], [place] [address]   CBC with Differential     Status: Abnormal   Collection Time: 07/25/22  8:31 PM  Result Value Ref Range   WBC 8.2 4.0 - 10.5 K/uL   RBC 3.98 (L) 4.22 - 5.81 MIL/uL   Hemoglobin 12.1 (L) 13.0 - 17.0 g/dL   HCT 19.1 (L) 47.8 - 29.5 %   MCV 92.0 80.0 - 100.0 fL   MCH 30.4 26.0 - 34.0 pg  MCHC 33.1 30.0 - 36.0 g/dL   RDW 16.1 09.6 - 04.5 %   Platelets 147 (L) 150 - 400 K/uL   nRBC 0.0 0.0 - 0.2 %   Neutrophils Relative % 81 %   Neutro Abs 6.6 1.7 - 7.7 K/uL   Lymphocytes Relative 10 %   Lymphs Abs 0.8 0.7 - 4.0 K/uL   Monocytes Relative 8 %   Monocytes Absolute 0.7 0.1 - 1.0 K/uL   Eosinophils Relative 0 %   Eosinophils Absolute 0.0 0.0 - 0.5 K/uL   Basophils Relative 0 %   Basophils Absolute 0.0 0.0 - 0.1 K/uL   Immature Granulocytes 1 %   Abs Immature Granulocytes 0.05 0.00 - 0.07 K/uL    Comment: Performed at [facility], [address], [place], [place] [address]  Basic metabolic panel     Status: Abnormal   Collection Time: 07/25/22  8:31 PM  Result Value Ref Range   Sodium 134 (L) 135 - 145 mmol/L   Potassium 4.1 3.5 - 5.1 mmol/L   Chloride 105 98 - 111 mmol/L   CO2 22 22 - 32 mmol/L    Glucose, Bld 193 (H) 70 - 99 mg/dL    Comment: Glucose reference range applies only to samples taken after fasting for at least 8 hours.   BUN 24 (H) 8 - 23 mg/dL   Creatinine, Ser 1.91 0.61 - 1.24 mg/dL   Calcium 9.3 8.9 - 47.8 mg/dL   GFR, Estimated 59 (L) >60 mL/min    Comment: (NOTE) Calculated using the CKD-EPI Creatinine Equation (2021)    Anion gap 7 5 - 15    Comment: Performed at [facility], [address] [place], [place] [address]  Protime-INR     Status: None   Collection Time: 07/25/22  8:31 PM  Result Value Ref Range   Prothrombin Time 13.8 11.4 - 15.2 seconds   INR 1.1 0.8 - 1.2    Comment: (NOTE) INR goal varies based on device and disease states. Performed at [facility], [address], [place], [place] [address]   Troponin I (High Sensitivity)     Status: None   Collection Time: 07/25/22 10:17 PM  Result Value Ref Range   Troponin I (High Sensitivity) 10 <18 ng/L    Comment: (NOTE) Elevated high sensitivity troponin I (hsTnI) values and significant  changes across serial measurements may suggest ACS but many other  chronic and acute conditions are known to elevate hsTnI results.  Refer to the "Links" section for chest pain algorithms and additional  guidance. Performed at [facility], [address], [place], [place] [address]   CBC     Status: Abnormal   Collection Time: 07/26/22  4:11 AM  Result Value Ref Range   WBC 4.2 4.0 - 10.5 K/uL   RBC 3.77 (L) 4.22 - 5.81 MIL/uL   Hemoglobin 11.4 (L) 13.0 - 17.0 g/dL   HCT 96.2 (L) 95.2 - 84.1 %   MCV 92.8 80.0 - 100.0 fL   MCH 30.2 26.0 - 34.0 pg   MCHC 32.6 30.0 - 36.0 g/dL   RDW 32.4 40.1 - 02.7 %   Platelets 134 (L) 150 - 400 K/uL   nRBC 0.0 0.0 - 0.2 %    Comment: Performed at [facility], [address], [place], [place] [address]  Comprehensive metabolic panel     Status: Abnormal   Collection Time: 07/26/22  4:11 AM  Result Value Ref Range   Sodium 135 135 -  145 mmol/L   Potassium 4.0 3.5 - 5.1  mmol/L   Chloride 106 98 - 111 mmol/L   CO2 23 22 - 32 mmol/L   Glucose, Bld 165 (H) 70 - 99 mg/dL    Comment: Glucose reference range applies only to samples taken after fasting for at least 8 hours.   BUN 21 8 - 23 mg/dL   Creatinine, Ser 9.14 0.61 - 1.24 mg/dL   Calcium 9.0 8.9 - 78.2 mg/dL   Total Protein 6.1 (L) 6.5 - 8.1 g/dL   Albumin 3.5 3.5 - 5.0 g/dL   AST 956 (H) 15 - 41 U/L   ALT 585 (H) 0 - 44 U/L   Alkaline Phosphatase 149 (H) 38 - 126 U/L   Total Bilirubin 1.5 (H) 0.3 - 1.2 mg/dL   GFR, Estimated >21 >30 mL/min    Comment: (NOTE) Calculated using the CKD-EPI Creatinine Equation (2021)    Anion gap 6 5 - 15    Comment: Performed at [facility], [address], [place], [place] [address]  Magnesium     Status: None   Collection Time: 07/26/22  4:11 AM  Result Value Ref Range   Magnesium 2.4 1.7 - 2.4 mg/dL    Comment: Performed at [facility], [address], [place], [place] [address]  Glucose, capillary     Status: Abnormal   Collection Time: 07/26/22  7:22 AM  Result Value Ref Range   Glucose-Capillary 103 (H) 70 - 99 mg/dL    Comment: Glucose reference range applies only to samples taken after fasting for at least 8 hours.  Glucose, capillary     Status: Abnormal   Collection Time: 07/26/22 11:08 AM  Result Value Ref Range   Glucose-Capillary 118 (H) 70 - 99 mg/dL    Comment: Glucose reference range applies only to samples taken after fasting for at least 8 hours.    ECHOCARDIOGRAM COMPLETE  Result Date: 07/26/2022    ECHOCARDIOGRAM REPORT   Patient Name:   [name] [name] Date of Exam: 07/26/2022 Medical Rec #:  [id number]     Height:       71.0 in Accession #:    [id number]    Weight:       205.7 lb Date of Birth:  10/20/1940     BSA:          2.134 m Patient Age:    81 years      BP:           148/58 mmHg Patient Gender: M             HR:           55 bpm. [facility] Procedure: 2D Echo, Cardiac Doppler, Color Doppler, 3D Echo and Strain Analysis  Indications:    Chest Pain R07.9  History:        Patient has prior history of Echocardiogram examinations, most                 recent 05/21/2021. CAD, Pericardial Disease and Previous                 Myocardial Infarction, Arrythmias:Atrial Fibrillation,                 Signs/Symptoms:Chest Pain; Risk Factors:Dyslipidemia,                 Hypertension, Diabetes and Former Smoker.  Sonographer:    Aron Baba Referring Phys: [id number] ASIA B ZIERLE-GHOSH  Sonographer Comments: Image acquisition challenging due to respiratory motion. Global longitudinal strain was attempted. IMPRESSIONS  1. Left ventricular ejection fraction, by estimation, is 70 to 75%. The left ventricle has hyperdynamic function. The left ventricle has no regional wall motion abnormalities. There is severe left ventricular hypertrophy. Left ventricular diastolic parameters are consistent with Grade I diastolic dysfunction (impaired relaxation). Elevated left atrial pressure. The average left ventricular global longitudinal strain is -20.0 %. The global longitudinal strain is normal.  2. Right ventricular systolic function is normal. The right ventricular size is normal. Tricuspid regurgitation signal is inadequate for assessing PA pressure.  3. Left atrial size was severely dilated.  4. A small pericardial effusion is present. The pericardial effusion is circumferential. There is no evidence of cardiac tamponade.  5. The mitral valve is normal in structure. No evidence of mitral valve regurgitation. No evidence of mitral stenosis.  6. The aortic valve is tricuspid. There is moderate calcification of the aortic valve. There is moderate thickening of the aortic valve. Aortic valve regurgitation is not visualized. Mild aortic valve stenosis.  7. The inferior vena cava is normal in size with greater than 50% respiratory variability, suggesting right atrial pressure of 3 mmHg. FINDINGS  Left Ventricle: Left ventricular ejection fraction, by estimation,  is 70 to 75%. The left ventricle has hyperdynamic function. The left ventricle has no regional wall motion abnormalities. The average left ventricular global longitudinal strain is -20.0  %. The global longitudinal strain is normal. The left ventricular internal cavity size was normal in size. There is severe left ventricular hypertrophy. Left ventricular diastolic parameters are consistent with Grade I diastolic dysfunction (impaired relaxation). Elevated left atrial pressure. Right Ventricle: The right ventricular size is normal. Right vetricular wall thickness was not well visualized. Right ventricular systolic function is normal. Tricuspid regurgitation signal is inadequate for assessing PA pressure. Left Atrium: Left atrial size was severely dilated. Right Atrium: Right atrial size was normal in size. Pericardium: A small pericardial effusion is present. The pericardial effusion is circumferential. There is no evidence of cardiac tamponade. Mitral Valve: The mitral valve is normal in structure. No evidence of mitral valve regurgitation. No evidence of mitral valve stenosis. Tricuspid Valve: The tricuspid valve is normal in structure. Tricuspid valve regurgitation is trivial. No evidence of tricuspid stenosis. Aortic Valve: The aortic valve is tricuspid. There is moderate calcification of the aortic valve. There is moderate thickening of the aortic valve. There is moderate aortic valve annular calcification. Aortic valve regurgitation is not visualized. Mild aortic stenosis is present. Aortic valve mean gradient measures 11.0 mmHg. Aortic valve peak gradient measures 18.8 mmHg. Aortic valve area, by VTI measures 1.94 cm. Pulmonic Valve: The pulmonic valve was not well visualized. Pulmonic valve regurgitation is mild. No evidence of pulmonic stenosis. Aorta: The aortic root is normal in size and structure. Venous: The inferior vena cava is normal in size with greater than 50% respiratory variability, suggesting  right atrial pressure of 3 mmHg. IAS/Shunts: No atrial level shunt detected by color flow Doppler.  LEFT VENTRICLE PLAX 2D LVIDd:         3.60 cm   Diastology LVIDs:         2.10 cm   LV e' medial:    3.59 cm/s LV PW:         1.70 cm   LV E/e' medial:  27.9 LV IVS:        1.70 cm   LV e' lateral:   3.42 cm/s LVOT  diam:     2.00 cm   LV E/e' lateral: 29.2 LV SV:         106 LV SV Index:   50        2D Longitudinal Strain LVOT Area:     3.14 cm  2D Strain GLS Avg:     -20.0 %                           3D Volume EF:                          3D EF:        58 %                          LV EDV:       144 ml                          LV ESV:       61 ml                          LV SV:        84 ml RIGHT VENTRICLE RV S prime:     11.20 cm/s TAPSE (M-mode): 1.6 cm LEFT ATRIUM              Index        RIGHT ATRIUM           Index LA diam:        5.00 cm  2.34 cm/m   RA Area:     18.40 cm LA Vol (A2C):   160.0 ml 74.98 ml/m  RA Volume:   38.40 ml  18.00 ml/m LA Vol (A4C):   141.0 ml 66.08 ml/m LA Biplane Vol: 155.0 ml 72.64 ml/m  AORTIC VALVE                     PULMONIC VALVE AV Area (Vmax):    2.01 cm      PR End Diast Vel: 7.29 msec AV Area (Vmean):   1.81 cm AV Area (VTI):     1.94 cm AV Vmax:           217.00 cm/s AV Vmean:          152.500 cm/s AV VTI:            0.550 m AV Peak Grad:      18.8 mmHg AV Mean Grad:      11.0 mmHg LVOT Vmax:         139.00 cm/s LVOT Vmean:        87.900 cm/s LVOT VTI:          0.339 m LVOT/AV VTI ratio: 0.62  AORTA Ao Root diam: 3.70 cm Ao Asc diam:  3.40 cm MITRAL VALVE MV Area (PHT): 1.71 cm     SHUNTS MV Decel Time: 444 msec     Systemic VTI:  0.34 m MR Peak grad: 8.9 mmHg      Systemic Diam: 2.00 cm MR Vmax:      149.00 cm/s MV E velocity: 100.00 cm/s MV A velocity: 143.00 cm/s MV E/A ratio:  0.70 Dina Rich MD Electronically signed by Dina Rich MD Signature Date/Time: 07/26/2022/12:29:25 PM    Final    US  Abdomen Limited RUQ (LIVER/GB)  Result Date:  07/26/2022 CLINICAL DATA:  Abnormal liver function tests EXAM: ULTRASOUND ABDOMEN LIMITED RIGHT UPPER QUADRANT COMPARISON:  CT 05/15/2022 FINDINGS: Gallbladder: Distended gallbladder with wall thickening measuring up to 5 mm. Wall edema as well. Possible component of sludge as well. No shadowing stones. There is a rounded area measuring 5 mm within the gallbladder lumen without shadowing. Possible polyp or tumefactive sludge. Common bile duct: Diameter: 5 mm Liver: Mildly echogenic hepatic parenchyma. Portal vein is patent on color Doppler imaging with normal direction of blood flow towards the liver. Other: None. IMPRESSION: Mild fatty liver infiltration.  No ductal dilatation. There is gallbladder wall thickening and edema with some possible sludge. There is also a nodular focus without shadowing measuring 5 mm. Possible small gallbladder polyp. Further workup as clinically appropriate such as MRI Electronically Signed   By: Karen Kays M.D.   On: 07/26/2022 10:04   DG Chest Port 1 View  Result Date: 07/25/2022 CLINICAL DATA:  Chest pain EXAM: PORTABLE CHEST 1 VIEW COMPARISON:  Chest x-ray 11/19/2021. CT abdomen and pelvis 05/15/2022. CT chest abdomen and pelvis 11/08/2021. FINDINGS: Heart is mildly enlarged, unchanged. Small nodular density seen in the right costophrenic angle, not seen on prior CT. The lungs are otherwise clear. There is no pleural effusion or pneumothorax. No acute fractures are seen. IMPRESSION: 1. No acute cardiopulmonary process. 2. Small nodular density in the right costophrenic angle, not seen on prior CT. Follow-up chest CT recommended, non emergently. Electronically Signed   By: Darliss Cheney M.D.   On: 07/25/2022 21:26    ROS:  Pertinent items noted in HPI and remainder of comprehensive ROS otherwise negative.  Blood pressure (!) 148/58, pulse (!) 48, temperature 97.7 F (36.5 C), temperature source Oral, resp. rate 11, height 5\' 11"  (1.803 m), weight 93.3 kg, SpO2 100  %.  Physical Exam Constitutional:      Appearance: He is well-developed.  HENT:     Head: Normocephalic.  Eyes:     Pupils: Pupils are equal, round, and reactive to light.  Cardiovascular:     Pulses:          Radial pulses are 2+ on the right side and 2+ on the left side.       Dorsalis pedis pulses are 2+ on the right side and 2+ on the left side.  Pulmonary:     Effort: Pulmonary effort is normal.  Abdominal:     Palpations: Abdomen is soft.     Tenderness: There is no abdominal tenderness. There is no guarding or rebound.  Neurological:     Mental Status: He is alert.     Assessment/Plan: Mr. Oxley is a 82 y/o male with PMHx of NSTEMI, acid reflux and colon cancer who presented with substernal chest pain and admitted for further evaluation.   #Gallbladder Thickening #Elevated LFTs Surgery was consulted with c/f acute cholecystitis. Patient is HDS, afebrile, though he has elevated LFTs. He is getting a MRI to evaluate for choledolithiasis. His RUQ Ultrasound yesterday demosntrated gallbladder thickening and a small polyp of 5mm. Patient denies abdominal pain on examination and does not experience chronic postprandial pain. I do not think he is a candidate for acute surgical intervention at this time as he is asymptomatic. Pancreatitis was also considered on the differential due to his pain radiating to his back. If gallstones are the etiology, the MRI will give better clarity on whether a cholecystectomy would be indicated in an outpatient setting.  Javin Nong L Luvia Orzechowski 07/26/2022, 12:35 PM

## 2022-07-26 NOTE — Assessment & Plan Note (Signed)
-  continue statin, ACE, beta blocker -Trop normal times two -EKG without acute ischemic changes -Monitor on tele

## 2022-07-26 NOTE — Consult Note (Addendum)
Cardiology Consultation   Patient ID: ATLEE KLUTH MRN: [id number]; DOB: 09/29/1940  Admit date: 07/25/2022 Date of Consult: 07/26/2022  PCP:  Richardean Chimera, MD   [facility] Providers Cardiologist:  Nona Dell, MD        Patient Profile:   [name] [name] is a 82 y.o. male with a hx of CAD (s/p DES to LCx in 12/2017), paroxysmal atrial fibrillation, HTN, HLD, Type 2 DM, prior pericardial effusion requiring a pericardial window, history of colon cancer (s/p partial colectomy) and history of prostate cancer who is being seen 07/26/2022 for the evaluation of chest pain at the request of Dr. Carren Rang.  History of Present Illness:   Mr. Benassi was examined by Dr. Diona Browner in 04/2022 and reported NYHA class II dyspnea but denied any chest pain or palpitations. BP was elevated, therefore Hydralazine was increased from 100 mg twice daily to 100mg  in AM/50mg  at lunch/100mg  in PM.   He presented to [facility] ED on 07/25/2022 for evaluation of chest pain and nausea for the past 3 hours. He had previously underwent endoscopy and colonoscopy on 07/24/2022 which had shown polyps and hemorrhoids but no significant abnormalities. In talking with the patient and his wife today, he reports he was in his normal state of health the day of his colonoscopy/EGD and yesterday morning he felt well initially and walked over 2 miles without any chest pain or dyspnea on exertion. Yesterday evening while sitting on his porch, he developed a pressure and burning sensation along his epigastric/lower sternal region which radiated into his back. Reports associated nausea at that time and his wife gave him some Mylanta with no improvement in symptoms. He later came to the ED for further evaluation. Reports his pain lasted for approximately 4 hours and then resolved. No recurrent pain overnight or this morning. No recent orthopnea, PND or pitting edema.  Initial labs showed WBC 8.2, Hgb 12.1, platelets 147,  Na+ 134, K+ 4.1 and creatinine 1.23 (baseline 0.7 - 0.8). Initial and repeat Hs Troponin negative at 11 and 10. CXR showed no acute cardiopulmonary abnormalities but was noted to have a small nodular density in the right costophrenic angle with follow-up Chest CT recommended. EKG shows sinus bradycardia, HR 58 with RBBB and LAFB.  Repeat labs this morning show his LFT's are significantly elevated with AST 888, ALT 585 and Alk Phos 149 (all previously normal 2 months prior). Total Bilirubin 1.5.   Past Medical History:  Diagnosis Date   Anemia    Arthritis    CAD (coronary artery disease)    DES to circumflex 12/2017   Colon cancer (HCC) 2023   Diabetes mellitus without complication (HCC)    Dysrhythmia    GERD (gastroesophageal reflux disease)    Glaucoma    Gout    Headache    History of kidney stones    Hypertension    NSTEMI (non-ST elevated myocardial infarction) (HCC) 12/31/2017   Paroxysmal atrial fibrillation (HCC)    Pericardial effusion    Idiopathic, recurrent pericardial effusion s/p pericardial window.   Prostate cancer (HCC) 2007   S/P Seed implants    Supraventricular tachycardia    Temporal arteritis [facility]     Past Surgical History:  Procedure Laterality Date   BIOPSY  10/12/2020   Procedure: BIOPSY;  Surgeon: Dolores Frame, MD;  [facility];  Service: Gastroenterology;;   BIOPSY  02/25/2021   Procedure: BIOPSY;  Surgeon: Dolores Frame, MD;  [facility];  Service: Gastroenterology;;   BIOPSY  04/11/2021   Procedure: BIOPSY;  Surgeon: Lemar Lofty., MD;  Location: [facility];  Service: Gastroenterology;;   CATARACT EXTRACTION W/PHACO Left 06/07/2015   Procedure: CATARACT EXTRACTION PHACO AND INTRAOCULAR LENS PLACEMENT LEFT EYE CDE=8.00;  Surgeon: Gemma Payor, MD;  Location: [facility];  Service: Ophthalmology;  Laterality: Left;   CATARACT EXTRACTION W/PHACO Right 06/17/2015   Procedure: CATARACT EXTRACTION PHACO  AND INTRAOCULAR LENS PLACEMENT RIGHT EYE CDE=11.09;  Surgeon: Gemma Payor, MD;  Location: [facility];  Service: Ophthalmology;  Laterality: Right;   COLONOSCOPY WITH PROPOFOL N/A 10/12/2020   Procedure: COLONOSCOPY WITH PROPOFOL;  Surgeon: Dolores Frame, MD;  [facility];  Service: Gastroenterology;  Laterality: N/A;  10:35   COLONOSCOPY WITH PROPOFOL N/A 02/25/2021   Procedure: COLONOSCOPY WITH PROPOFOL;  Surgeon: Dolores Frame, MD;  [facility];  Service: Gastroenterology;  Laterality: N/A;  8:10   CORONARY ANGIOPLASTY WITH STENT PLACEMENT  01/01/2018   CORONARY STENT INTERVENTION N/A 01/01/2018   Procedure: CORONARY STENT INTERVENTION;  Surgeon: Corky Crafts, MD;  [facility]: [facility];  Service: Cardiovascular;  Laterality: N/A;   ENDOSCOPIC MUCOSAL RESECTION N/A 04/11/2021   Procedure: ENDOSCOPIC MUCOSAL RESECTION;  Surgeon: Meridee Score Netty Starring., MD;  Location: [facility];  Service: Gastroenterology;  Laterality: N/A;   ENTEROSCOPY N/A 10/12/2020   Procedure: PUSH ENTEROSCOPY;  Surgeon: Dolores Frame, MD;  [facility];  Service: Gastroenterology;  Laterality: N/A;   ENTEROSCOPY N/A 04/11/2021   Procedure: ENTEROSCOPY;  Surgeon: Meridee Score Netty Starring., MD;  Location: [facility];  Service: Gastroenterology;  Laterality: N/A;   ESOPHAGOGASTRODUODENOSCOPY (EGD) WITH PROPOFOL N/A 10/12/2020   Procedure: ESOPHAGOGASTRODUODENOSCOPY (EGD) WITH PROPOFOL;  Surgeon: Dolores Frame, MD;  [facility];  Service: Gastroenterology;  Laterality: N/A;   FRACTURE SURGERY Left 2010   ankle   HEMOSTASIS CLIP PLACEMENT  04/11/2021   Procedure: HEMOSTASIS CLIP PLACEMENT;  Surgeon: Lemar Lofty., MD;  Location: [facility];  Service: Gastroenterology;;   INSERTION PROSTATE RADIATION SEED  2007   KNEE ARTHROSCOPY Left    LAPAROSCOPY N/A 05/22/2021   Procedure: LAPAROSCOPY DIAGNOSTIC WITH WASHOUT OF  ABDOMEN;  Surgeon: Romie Levee, MD;  Location: [facility];  Service: General;  Laterality: N/A;   LEFT HEART CATH AND CORONARY ANGIOGRAPHY N/A 01/01/2018   Procedure: LEFT HEART CATH AND CORONARY ANGIOGRAPHY;  Surgeon: Corky Crafts, MD;  [facility]: [facility];  Service: Cardiovascular;  Laterality: N/A;   ORIF TIBIA & FIBULA FRACTURES Left 2003   Distal tibial/fibula    PERICARDIAL WINDOW  02/2007   PERICARDIOCENTESIS  2007   hx/notes 10/19/2011   POLYPECTOMY  10/12/2020   Procedure: POLYPECTOMY;  Surgeon: Dolores Frame, MD;  [facility];  Service: Gastroenterology;;  small bowel, cecal   POLYPECTOMY  02/25/2021   Procedure: POLYPECTOMY;  Surgeon: Dolores Frame, MD;  [facility];  Service: Gastroenterology;;  transverse colon x2   SUBMUCOSAL LIFTING INJECTION  04/11/2021   Procedure: SUBMUCOSAL LIFTING INJECTION;  Surgeon: Lemar Lofty., MD;  Location: [facility];  Service: Gastroenterology;;   SUBMUCOSAL TATTOO INJECTION  04/11/2021   Procedure: SUBMUCOSAL TATTOO INJECTION;  Surgeon: Lemar Lofty., MD;  Location: [facility];  Service: Gastroenterology;;     Home Medications:  Prior to Admission medications   Medication Sig Start Date End Date Taking? Authorizing Provider  acetaminophen (TYLENOL) 325 MG tablet Take 650 mg by mouth  every 4 (four) hours as needed (pain or fever).   Yes [provider]  allopurinol (ZYLOPRIM) 300 MG tablet Take 450 mg by mouth in the morning. 02/07/22  Yes [provider]  aluminum-magnesium hydroxide 200-200 MG/5ML suspension Take 5 mLs by mouth every 6 (six) hours as needed for indigestion.   Yes [provider]  apixaban (ELIQUIS) 5 MG TABS tablet Take 1 tablet (5 mg total) by mouth 2 (two) times daily for 2 days. 07/24/22 07/26/22 Yes Mansouraty, Netty Starring., MD  Ascorbic Acid (VITAMIN C) 1000 MG tablet Take 1,000 mg by mouth 2 (two) times daily.   Yes  [provider]  atorvastatin (LIPITOR) 80 MG tablet TAKE 1 TABLET BY MOUTH ONCE DAILY AT  6  PM Patient taking differently: Take 80 mg by mouth daily. 12/01/21  Yes Jonelle Sidle, MD  benazepril (LOTENSIN) 40 MG tablet TAKE 1 TABLET BY MOUTH ONCE DAILY *NEEDS  OFFICE  VISIT* Patient taking differently: Take 40 mg by mouth daily. 12/13/21  Yes Jonelle Sidle, MD  cyanocobalamin 1000 MCG tablet Take 1 tablet (1,000 mcg total) by mouth daily. 11/22/21  Yes Kathlen Mody, MD  diltiazem (CARDIZEM CD) 240 MG 24 hr capsule Take 240 mg by mouth daily.   Yes [provider]  dorzolamide (TRUSOPT) 2 % ophthalmic solution Place 1 drop into both eyes 2 (two) times daily. 03/30/21  Yes [provider]  FERREX 150 150 MG capsule Take 150 mg by mouth 2 (two) times daily. 01/23/20  Yes [provider]  hydrALAZINE (APRESOLINE) 100 MG tablet Take 100 mg in the morning, 50 mg midday, and 100 mg at bedtime Patient taking differently: Take 50-100 mg by mouth See admin instructions. Take 100 mg in the morning, 50 mg midday, and 100 mg at bedtime 05/24/22  Yes Jonelle Sidle, MD  latanoprost (XALATAN) 0.005 % ophthalmic solution Place 1 drop into both eyes at bedtime. 01/22/20  Yes [provider]  loperamide (IMODIUM) 2 MG capsule Take 2 mg by mouth as needed for diarrhea or loose stools.   Yes [provider]  metFORMIN (GLUCOPHAGE) 500 MG tablet Take 500 mg by mouth 2 (two) times daily. 03/14/22  Yes [provider]  metoprolol succinate (TOPROL-XL) 50 MG 24 hr tablet Take 50 mg by mouth daily. Take with or immediately following a meal.   Yes [provider]  Multiple Vitamin (MULTIVITAMIN) tablet Take 1 tablet by mouth in the morning.   Yes [provider]  pantoprazole (PROTONIX) 40 MG tablet Take 1 tablet by mouth once daily 06/14/22  Yes Jonelle Sidle, MD  prochlorperazine (COMPAZINE) 10 MG tablet Take 1 tablet (10 mg  total) by mouth every 6 (six) hours as needed for nausea or vomiting. 06/23/21  Yes Doreatha Massed, MD  spironolactone (ALDACTONE) 25 MG tablet Take 0.5 tablets (12.5 mg total) by mouth daily. 06/09/22  Yes Jonelle Sidle, MD  [facility] ULTRA test strip USE 1 STRIP TO CHECK GLUCOSE ONCE DAILY 06/06/21   [provider]  polyethylene glycol-electrolytes (NULYTELY) 420 g solution Take 4,000 mLs by mouth once. Patient not taking: Reported on 07/25/2022 06/01/22   [provider]    Inpatient Medications: Scheduled Meds:  atorvastatin  80 mg Oral Daily   benazepril  40 mg Oral Daily   diltiazem  240 mg Oral Daily   hydrALAZINE  100 mg Oral BID   hydrALAZINE  50 mg Oral Q24H   insulin aspart  0-15  Units Subcutaneous TID WC   iron polysaccharides  150 mg Oral BID   metoprolol succinate  50 mg Oral Daily   pantoprazole  40 mg Oral Daily   spironolactone  12.5 mg Oral Daily   Continuous Infusions:  sodium chloride 75 mL/hr at 07/25/22 2347   PRN Meds: acetaminophen, nitroGLYCERIN, ondansetron (ZOFRAN) IV  Allergies:    Allergies  Allergen Reactions   Amiodarone Rash    Social History:   Social History   Socioeconomic History   Marital status: Married    Spouse name: Not on file   Number of children: Not on file   Years of education: Not on file   Highest education level: Not on file  Occupational History   Not on file  Tobacco Use   Smoking status: Former    Packs/day: 0.30    Years: 10.00    Additional pack years: 0.00    Total pack years: 3.00    Types: Cigarettes    Start date: 08/06/1958    Quit date: 1968    Years since quitting: 56.3   Smokeless tobacco: Never  Vaping Use   Vaping Use: Never used  Substance and Sexual Activity   Alcohol use: Not Currently   Drug use: Never   Sexual activity: Yes  Other Topics Concern   Not on file  Social History Narrative   Not on file   Social Determinants of Health   Financial Resource Strain:  Not on file  Food Insecurity: Not on file  Transportation Needs: Not on file  Physical Activity: Not on file  Stress: Not on file  Social Connections: Not on file  Intimate Partner Violence: Not on file    Family History:    Family History  Problem Relation Age of Onset   CAD Mother        MI in 50s   Prostate cancer Father    Prostate cancer Brother    Lung cancer Brother      ROS:  Please see the history of present illness.   All other ROS reviewed and negative.     Physical Exam/Data:   Vitals:   07/25/22 2200 07/25/22 2245 07/26/22 0000 07/26/22 0146  BP: (!) 124/58 (!) 153/67 (!) 153/67 (!) 128/56  Pulse: (!) 56 (!) 52 (!) 56 (!) 48  Resp: 13 16 18 15   Temp:  97.8 F (36.6 C) 97.8 F (36.6 C) 97.8 F (36.6 C)  TempSrc:  Oral Oral Oral  SpO2:  97% 97% 96%  Weight:  93.3 kg 93.3 kg   Height:   5\' 11"  (1.803 m)    No intake or output data in the 24 hours ending 07/26/22 0742    07/26/2022   12:00 AM 07/25/2022   10:45 PM 07/24/2022   10:13 AM  Last 3 Weights  Weight (lbs) 205 lb 11 oz 205 lb 9.6 oz 210 lb  Weight (kg) 93.3 kg 93.26 kg 95.255 kg     Body mass index is 28.69 kg/m.  General:  Well nourished, well developed male appearing in no acute distress. HEENT: normal Neck: no JVD Vascular: No carotid bruits; Distal pulses 2+ bilaterally Cardiac:  normal S1, S2; RRR; no murmur. Lungs:  clear to auscultation bilaterally, no wheezing, rhonchi or rales  Abd: soft, nontender, no hepatomegaly  Ext: no pitting edema Musculoskeletal:  No deformities, BUE and BLE strength normal and equal Skin: warm and dry  Neuro:  CNs 2-12 intact, no focal abnormalities noted Psych:  Normal  affect   EKG:  The EKG was personally reviewed and demonstrates: Sinus bradycardia, HR 58 with RBBB and LAFB.   Telemetry:  Telemetry was personally reviewed and demonstrates: Sinus bradycardia, HR in 40's to 50's with occasional PVC's.   Relevant CV Studies:  LHC: 12/2017 Mid Cx  lesion is 95% stenosed. Prox LAD lesion is 25% stenosed. Mid LAD lesion is 30% stenosed. Prox RCA to Mid RCA lesion is 50% stenosed. The left ventricular systolic function is normal. LV end diastolic pressure is normal. LVEDP 12 mm Hg. The left ventricular ejection fraction is 50-55% by visual estimate. There is no aortic valve stenosis. Severe tortuosity in the right subclavian which makes torquing catheters difficult. Would consider femoral approach in the future, particularly if emergent cardiac cath was needed. A drug-eluting stent was successfully placed using a STENT SIERRA 3.25 X 15 MM. Post intervention, there is a 0% residual stenosis.       Recommend uninterrupted dual antiplatelet therapy with Aspirin 81mg  daily and Ticagrelor 90mg  twice daily for a minimum of 12 months (ACS - Class I recommendation).    Continue aggressive secondary prevention.  IV Cangrelor for 2 hours.  Cangrelor was used in the event there was any bleeding issue in the forearm, it could be turned off and would wear off quickly.   Addendum: The patient is a candidate for the Xience short DAPT study due to his age.  If he chooses to participate in the study and his bleeding risk is considered higher than his ischemic risk, I think it would be reasonable to shorten his DAPT duration to 30 days.      Echocardiogram: 04/2021 IMPRESSIONS     1. Left ventricular ejection fraction, by estimation, is 70 to 75%. The  left ventricle has hyperdynamic function. The left ventricle has no  regional wall motion abnormalities. There is severe concentric left  ventricular hypertrophy. There is a resting  LVOT gradient of . Findings suggestive of HOCM.      Left ventricular diastolic parameters are consistent with Grade I  diastolic dysfunction (impaired relaxation). Elevated left ventricular  end-diastolic pressure.   2. Right ventricular systolic function is normal. The right ventricular  size is normal.  Tricuspid regurgitation signal is inadequate for assessing  PA pressure.   3. The mitral valve is normal in structure. No evidence of mitral valve  regurgitation. No evidence of mitral stenosis.   4. The aortic valve is calcified. Aortic valve regurgitation is not  visualized. Aortic valve sclerosis/calcification is present, without any  evidence of aortic stenosis. Aortic valve area, by VTI measures 3.17 cm.  Aortic valve mean gradient measures  9.0 mmHg. Aortic valve Vmax measures 2.10 m/s.   5. There is a mobile density in the RVOT in the vicinity of the pulmonic  valve. Cannot rule out vegetation vs thrombus by this study.   6. Aortic dilatation noted. There is borderline dilatation of the aortic  root, measuring 37 mm. There is borderline dilatation of the ascending  aorta, measuring 37 mm.   7. The inferior vena cava is normal in size with greater than 50%  respiratory variability, suggesting right atrial pressure of 3 mmHg.   8. A small pericardial effusion is present. The pericardial effusion is  circumferential. There is no evidence of cardiac tamponade.   9. Consider Chest CTA to rule out PE an if negative consider TEE to  assess RVOT and PV further given mobile target if endocarditis if a  concern.  Asked to review study by ordering team regarding findings in region of the  RVOT and pulmonic valve. There is bubble contrast in this region (likely  from peripheral IV) and most likely the mobile density is secondary to  artifact. Images that follow in the   absence of bubble contrast do not show a consistent abnormality to  suggest definitive thrombus or vegatation. Pericardial effusion is  moderate posteriorly and small anteriorly.  Nona Dell MD  Electronically Amended 05/21/2021, 2:18 PM   Laboratory Data:  High Sensitivity Troponin:   Recent Labs  Lab 07/25/22 2031 07/25/22 2217  TROPONINIHS 11 10     Chemistry Recent Labs  Lab 07/25/22 2031  07/26/22 0411  NA 134* 135  K 4.1 4.0  CL 105 106  CO2 22 23  GLUCOSE 193* 165*  BUN 24* 21  CREATININE 1.23 0.88  CALCIUM 9.3 9.0  MG  --  2.4  GFRNONAA 59* >60  ANIONGAP 7 6    Recent Labs  Lab 07/26/22 0411  PROT 6.1*  ALBUMIN 3.5  AST 888*  ALT 585*  ALKPHOS 149*  BILITOT 1.5*   Lipids No results for input(s): "CHOL", "TRIG", "HDL", "LABVLDL", "LDLCALC", "CHOLHDL" in the last 168 hours.  Hematology Recent Labs  Lab 07/25/22 2031 07/26/22 0411  WBC 8.2 4.2  RBC 3.98* 3.77*  HGB 12.1* 11.4*  HCT 36.6* 35.0*  MCV 92.0 92.8  MCH 30.4 30.2  MCHC 33.1 32.6  RDW 15.0 15.2  PLT 147* 134*   Thyroid No results for input(s): "TSH", "FREET4" in the last 168 hours.  BNPNo results for input(s): "BNP", "PROBNP" in the last 168 hours.  DDimer No results for input(s): "DDIMER" in the last 168 hours.   Radiology/Studies:  DG Chest Port 1 View  Result Date: 07/25/2022 CLINICAL DATA:  Chest pain EXAM: PORTABLE CHEST 1 VIEW COMPARISON:  Chest x-ray 11/19/2021. CT abdomen and pelvis 05/15/2022. CT chest abdomen and pelvis 11/08/2021. FINDINGS: Heart is mildly enlarged, unchanged. Small nodular density seen in the right costophrenic angle, not seen on prior CT. The lungs are otherwise clear. There is no pleural effusion or pneumothorax. No acute fractures are seen. IMPRESSION: 1. No acute cardiopulmonary process. 2. Small nodular density in the right costophrenic angle, not seen on prior CT. Follow-up chest CT recommended, non emergently. Electronically Signed   By: Darliss Cheney M.D.   On: 07/25/2022 21:26     Assessment and Plan:   1. Chest Pain with Atypical Features for a Cardiac Etiology - His pain was mostly along his lower sternal region/epigastric area and occurred at rest and lasted for over 4 hours. Pain was not associated with exertion. He had actually walked over 2 miles earlier in the day without any angina. - He has ruled out for ACS and EKG is without acute ST  changes. An echocardiogram is pending to assess for any structural abnormalities. At this time, would not anticipate further cardiac workup and would focus on further evaluation of his elevated LFT's as discussed below.  2.  CAD - He is s/p DES to LCx in 12/2017. Repeat echocardiogram is pending for this admission. - He was on Atorvastatin 80 mg daily prior to admission but this is currently held given elevated LFT's. He is not on ASA given the need for anticoagulation. Remains on Toprol-XL 50 mg daily.  3. Paroxysmal Atrial Fibrillation - He denies any recent palpitations and is in normal sinus rhythm this admission. He has been continued on Cardizem CD 240  mg daily along with Toprol-XL 50 mg daily for rate control. Would not further titrate given HR in the 50's.  - He is on Eliquis 5 mg twice daily for anticoagulation. Will hold for right now in case he requires any surgical procedures this admission. If RUQ [name] is reassuring, would recommend resuming and Hospitalist is aware.   4. HTN - The patient and his wife report BP has been variable at home but it was well-controlled at 128/56 on most recent check. He has been continued on Benazepril, Cardizem CD, Hydralazine, Toprol-XL and Spironolactone. If BP remains above goal, would further titrate Hydralazine to 100 mg TID.   5. Elevated LFT's - Labs this AM show his LFT's are significantly elevated with AST 888, ALT 585 and Alk Phos 149 (all previously normal 2 months prior). Total Bilirubin 1.5. - Reviewed with the Hospitalist team and they will order a RUQ [name]. Patient made NPO. Holding Eliquis for now as outlined above in case he requires invasive procedures. Atorvastatin also held given his elevated LFT's.    For questions or updates, please contact [facility] Please consult [email or web address] for contact info under    Signed, Ellsworth Lennox, PA-C  07/26/2022 7:42 AM  Attending note Patient seen and discussed with PA Iran Ouch, I  agree with her documentation. 82 yo male history of PAF on eliquis, CAD with prior DES in 2019 to LCX, HL, severe LVH with dynamic LVOT gradient mild, HTN, admitted with epigastric/chest pain. Reports sudden onset of burning/pressure midchest/epigastrium. Lasted 4 hours straight, resolved on its own. Some associated nausea.    WBC 8.2 Hgb 12.1 Plt 147 K 4.1 Cr 1.23 Tbili 1.5 AST 888 ALT 858 Alk phos 149 Trop 11-->10 EKG SR, RBBB/LAFB, no acute ischemic changes CXR no cuate process RUQ [name]: gallbladder thickening and edema   1.Chest pain/epgiastric pain - symptoms not consistent with cardiac chest pain - workup thus far benign by EKG, enzymes - suspect GI etiology based on LFTs, abd US findings - f/u echo, if benign no further cardiac testing would be planned and we would sign off  2. Afib - no acute issues - ok to hold eliquis for time being in anticipation of any invasive procedures. If none planned then can restart - some low HRs at times, lower toprol to 25mg  daily   Tolerates greater than 4 METs without symptoms. He has no active acute cardiac conditions. No contraindication to any neccesary GI procedures from cardiac standpoint.  We will f/u echo, if benign we will sign off inpatient care.   Dina Rich MD

## 2022-07-26 NOTE — Progress Notes (Signed)
  Echocardiogram 2D Echocardiogram has been performed.  [name] [name] 07/26/2022, 11:00 AM

## 2022-07-26 NOTE — Progress Notes (Signed)
TCS/enteroscopy note in recall for 3 yrs

## 2022-07-26 NOTE — TOC Progression Note (Signed)
  Transition of Care [facility]) Screening Note   Patient Details  Name: COUNCIL MUNGUIA Date of Birth: March 04, 1941   Transition of Care [facility]) CM/SW Contact:    Elliot Gault, LCSW Phone Number: 07/26/2022, 9:18 AM    Transition of Care Department [facility]) has reviewed patient and no TOC needs have been identified at this time. We will continue to monitor patient advancement through interdisciplinary progression rounds. If new patient transition needs arise, please place a TOC consult.

## 2022-07-27 DIAGNOSIS — I25110 Atherosclerotic heart disease of native coronary artery with unstable angina pectoris: Secondary | ICD-10-CM | POA: Diagnosis not present

## 2022-07-27 DIAGNOSIS — K810 Acute cholecystitis: Secondary | ICD-10-CM | POA: Diagnosis not present

## 2022-07-27 DIAGNOSIS — K8000 Calculus of gallbladder with acute cholecystitis without obstruction: Secondary | ICD-10-CM | POA: Diagnosis present

## 2022-07-27 DIAGNOSIS — E1122 Type 2 diabetes mellitus with diabetic chronic kidney disease: Secondary | ICD-10-CM | POA: Diagnosis not present

## 2022-07-27 DIAGNOSIS — E119 Type 2 diabetes mellitus without complications: Secondary | ICD-10-CM | POA: Diagnosis not present

## 2022-07-27 DIAGNOSIS — I252 Old myocardial infarction: Secondary | ICD-10-CM | POA: Diagnosis not present

## 2022-07-27 DIAGNOSIS — K449 Diaphragmatic hernia without obstruction or gangrene: Secondary | ICD-10-CM | POA: Diagnosis not present

## 2022-07-27 DIAGNOSIS — K8001 Calculus of gallbladder with acute cholecystitis with obstruction: Secondary | ICD-10-CM

## 2022-07-27 DIAGNOSIS — E1165 Type 2 diabetes mellitus with hyperglycemia: Secondary | ICD-10-CM | POA: Diagnosis not present

## 2022-07-27 DIAGNOSIS — I452 Bifascicular block: Secondary | ICD-10-CM | POA: Diagnosis not present

## 2022-07-27 DIAGNOSIS — I2510 Atherosclerotic heart disease of native coronary artery without angina pectoris: Secondary | ICD-10-CM | POA: Diagnosis not present

## 2022-07-27 DIAGNOSIS — Z79899 Other long term (current) drug therapy: Secondary | ICD-10-CM | POA: Diagnosis not present

## 2022-07-27 DIAGNOSIS — D696 Thrombocytopenia, unspecified: Secondary | ICD-10-CM | POA: Diagnosis not present

## 2022-07-27 DIAGNOSIS — D127 Benign neoplasm of rectosigmoid junction: Secondary | ICD-10-CM | POA: Diagnosis not present

## 2022-07-27 DIAGNOSIS — E782 Mixed hyperlipidemia: Secondary | ICD-10-CM | POA: Diagnosis not present

## 2022-07-27 DIAGNOSIS — I480 Paroxysmal atrial fibrillation: Secondary | ICD-10-CM | POA: Diagnosis not present

## 2022-07-27 DIAGNOSIS — I10 Essential (primary) hypertension: Secondary | ICD-10-CM | POA: Diagnosis not present

## 2022-07-27 DIAGNOSIS — Z8546 Personal history of malignant neoplasm of prostate: Secondary | ICD-10-CM | POA: Diagnosis not present

## 2022-07-27 DIAGNOSIS — Z7901 Long term (current) use of anticoagulants: Secondary | ICD-10-CM | POA: Diagnosis not present

## 2022-07-27 DIAGNOSIS — Z955 Presence of coronary angioplasty implant and graft: Secondary | ICD-10-CM | POA: Diagnosis not present

## 2022-07-27 DIAGNOSIS — Z8249 Family history of ischemic heart disease and other diseases of the circulatory system: Secondary | ICD-10-CM | POA: Diagnosis not present

## 2022-07-27 DIAGNOSIS — K219 Gastro-esophageal reflux disease without esophagitis: Secondary | ICD-10-CM | POA: Diagnosis not present

## 2022-07-27 DIAGNOSIS — D125 Benign neoplasm of sigmoid colon: Secondary | ICD-10-CM | POA: Diagnosis not present

## 2022-07-27 DIAGNOSIS — N179 Acute kidney failure, unspecified: Secondary | ICD-10-CM | POA: Diagnosis not present

## 2022-07-27 DIAGNOSIS — D649 Anemia, unspecified: Secondary | ICD-10-CM | POA: Diagnosis not present

## 2022-07-27 DIAGNOSIS — I200 Unstable angina: Secondary | ICD-10-CM | POA: Diagnosis not present

## 2022-07-27 DIAGNOSIS — E860 Dehydration: Secondary | ICD-10-CM | POA: Diagnosis not present

## 2022-07-27 DIAGNOSIS — Z87891 Personal history of nicotine dependence: Secondary | ICD-10-CM | POA: Diagnosis not present

## 2022-07-27 DIAGNOSIS — K222 Esophageal obstruction: Secondary | ICD-10-CM | POA: Diagnosis not present

## 2022-07-27 LAB — CBC
HCT: 36.1 % — ABNORMAL LOW (ref 39.0–52.0)
Hemoglobin: 11.8 g/dL — ABNORMAL LOW (ref 13.0–17.0)
MCH: 30.4 pg (ref 26.0–34.0)
MCHC: 32.7 g/dL (ref 30.0–36.0)
MCV: 93 fL (ref 80.0–100.0)
Platelets: 133 10*3/uL — ABNORMAL LOW (ref 150–400)
RBC: 3.88 MIL/uL — ABNORMAL LOW (ref 4.22–5.81)
RDW: 15.2 % (ref 11.5–15.5)
WBC: 4.7 10*3/uL (ref 4.0–10.5)
nRBC: 0 % (ref 0.0–0.2)

## 2022-07-27 LAB — COMPREHENSIVE METABOLIC PANEL
ALT: 435 U/L — ABNORMAL HIGH (ref 0–44)
AST: 289 U/L — ABNORMAL HIGH (ref 15–41)
Albumin: 3.5 g/dL (ref 3.5–5.0)
Alkaline Phosphatase: 164 U/L — ABNORMAL HIGH (ref 38–126)
Anion gap: 8 (ref 5–15)
BUN: 12 mg/dL (ref 8–23)
CO2: 22 mmol/L (ref 22–32)
Calcium: 8.8 mg/dL — ABNORMAL LOW (ref 8.9–10.3)
Chloride: 107 mmol/L (ref 98–111)
Creatinine, Ser: 0.73 mg/dL (ref 0.61–1.24)
GFR, Estimated: >60 mL/min (ref >60)
Glucose, Bld: 127 mg/dL — ABNORMAL HIGH (ref 70–99)
Potassium: 3.8 mmol/L (ref 3.5–5.1)
Sodium: 137 mmol/L (ref 135–145)
Total Bilirubin: 1 mg/dL (ref 0.3–1.2)
Total Protein: 6 g/dL — ABNORMAL LOW (ref 6.5–8.1)

## 2022-07-27 LAB — GLUCOSE, CAPILLARY
Glucose-Capillary: 121 mg/dL — ABNORMAL HIGH (ref 70–99)
Glucose-Capillary: 215 mg/dL — ABNORMAL HIGH (ref 70–99)
Glucose-Capillary: 74 mg/dL (ref 70–99)
Glucose-Capillary: 156 mg/dL — ABNORMAL HIGH (ref 70–99)

## 2022-07-27 LAB — HEMOGLOBIN A1C
Hgb A1c MFr Bld: 7.6 % — ABNORMAL HIGH (ref 4.8–5.6)
Mean Plasma Glucose: 171.42 mg/dL

## 2022-07-27 MED ORDER — CHLORHEXIDINE GLUCONATE CLOTH 2 % EX PADS
6.0000 | MEDICATED_PAD | Freq: Once | CUTANEOUS | Status: AC
  Administered 2022-07-28: 6 via TOPICAL

## 2022-07-27 MED ORDER — LATANOPROST 0.005 % OP SOLN
1.0000 [drp] | Freq: Every day | OPHTHALMIC | Status: DC
  Administered 2022-07-27 – 2022-07-28 (×2): 1 [drp] via OPHTHALMIC

## 2022-07-27 MED ORDER — CHLORHEXIDINE GLUCONATE CLOTH 2 % EX PADS
6.0000 | MEDICATED_PAD | Freq: Once | CUTANEOUS | Status: AC
  Administered 2022-07-27: 6 via TOPICAL

## 2022-07-27 MED ORDER — DORZOLAMIDE HCL 2 % OP SOLN
1.0000 [drp] | Freq: Two times a day (BID) | OPHTHALMIC | Status: DC
  Administered 2022-07-27 – 2022-07-29 (×4): 1 [drp] via OPHTHALMIC

## 2022-07-27 MED ORDER — MELATONIN 3 MG PO TABS
6.0000 mg | ORAL_TABLET | Freq: Once | ORAL | Status: AC
  Administered 2022-07-27: 6 mg via ORAL
  Filled 2022-07-27: qty 2

## 2022-07-27 NOTE — Progress Notes (Signed)
PROGRESS NOTE     Zamarian Scarano, is a 82 y.o. male, DOB - 05/09/1940, JXB:[id number]  Admit date - 07/25/2022   Admitting Physician Takeyah Wieman Mariea Clonts, MD  Outpatient Primary MD for the patient is Richardean Chimera, MD  LOS - 0  Chief Complaint  Patient presents with   Chest Pain        Brief Narrative:  82 y.o. male with a hx of CAD (s/p DES to LCx in 12/2017), PAFib, HTN, HLD, Type 2 DM, prior pericardial effusion requiring a pericardial window, history of colon cancer (s/p partial colectomy) and history of prostate cancer was admitted on 07/26/2022 with the acute elevation of LFTs with substernal and epigastric pain associated with nausea after eating spaghetti with greasy meat sauce (minced meat)  -Assessment and Plan: 1)Cholelithiasis with Acute Cholecystitis with Obstruction---patient presented with substernal and epigastric pain associated with nausea after eating spaghetti with greasy meat sauce (minced meat) -Right upper quadrant ultrasound and MRCP noted with cholelithiasis and possible stone in the cystic duct but no biliary dilatation -Imaging and clinical findings consistent with acute cholecystitis noted -Clinically No evidence for ascending cholangitis at this time -General surgery consult appreciated -Last dose of Eliquis was p.m. of 07/25/2022 (patient was off Eliquis for 3 days from 07/22/22 through 07/24/2022 to allow for EGD and colonoscopy 07/27/22 -Tolerating liquid diet well--advance to low-fat diet for now and n.p.o. after midnight No significant abdominal pain -Continue Rocephin for/Flagyl -c/n As needed antiemetics -c/n As needed opiates -Hold Eliquis to allow for  lap chole tentatively planned for 07/28/2022 -Gentle IV fluids  2)PAF -stable -Severely dilated left atrium  -Mild aortic valve stenosis noted, no mitral stenosis -continue Cardizem and metoprolol for rate control -Hold Eliquis as above #1  3)DM2-  -Prior A1c 6.0 reflecting excellent diabetic control  PTA  -hold metformin Use Novolog/Humalog Sliding scale insulin with Accu-Cheks/Fingersticks as ordered   4)Atypical chest Pain/H/o CAD with Prior Stents--- -Patient was ruled out for ACS by cardiac enzymes and EKG -On further evaluation patient actually had substernal/epigastric pain radiating to his back associated with nausea after eating a greasy meal---workup consistent with cholelithiasis with acute cholecystitis -Cardiology consult and input appreciated -Echo with EF of 30 to 35%, with grade 1 diastolic dysfunction and severe LVH, no wall motion abnormalities Hold Lipitor due to elevated LFTs  5)Elevated LFTs---  AST is 888 and ALF is 585, T. bili is 1.5 , alk phos 149  -LFTs has been previously checked 4 times in the last 6 months and was Normal each time until Now -Suspect this is related to gallstones as above #1 -Hold statin as above -LFTs are trending down  6)HTN -Continue Aldactone, benazepril, Cardizem, metoprolol, hydralazine  7) chronic anemia and thrombocytopenia--- Hgb and platelets close to baseline at this time- --no acute bleeding concerns -Eliquis currently on hold due to potential for lap chole  Status is: Inpatient   Disposition: The patient is from: Home              Anticipated d/c is to: Home              Anticipated d/c date is: 2 days              Patient currently is not medically stable to d/c. Barriers: Not Clinically Stable-   Code Status :  -  Code Status: Full Code   Family Communication:    Discussed with wife at bedside  DVT Prophylaxis  :   - SCDs  heparin injection 5,000 Units Start: 07/26/22 1400 SCDs Start: 07/25/22 2302  Lab Results  Component Value Date   PLT 133 (L) 07/27/2022   Inpatient Medications  Scheduled Meds:  aspirin EC  81 mg Oral Q breakfast   benazepril  40 mg Oral Daily   diltiazem  240 mg Oral Daily   heparin injection (subcutaneous)  5,000 Units Subcutaneous Q8H   hydrALAZINE  100 mg Oral TID   insulin  aspart  0-15 Units Subcutaneous TID WC   iron polysaccharides  150 mg Oral BID   metoprolol succinate  25 mg Oral Daily   pantoprazole  40 mg Oral Daily   spironolactone  12.5 mg Oral Daily   Continuous Infusions:  sodium chloride 50 mL/hr at 07/26/22 2002   cefTRIAXone (ROCEPHIN)  IV 2 g (07/26/22 1512)   metronidazole 500 mg (07/27/22 0555)   PRN Meds:.acetaminophen, fentaNYL (SUBLIMAZE) injection, nitroGLYCERIN, ondansetron (ZOFRAN) IV, oxyCODONE  Anti-infectives (From admission, onward)    Start     Dose/Rate Route Frequency Ordered Stop   07/26/22 1845  metroNIDAZOLE (FLAGYL) IVPB 500 mg        500 mg 100 mL/hr over 60 Minutes Intravenous Every 12 hours 07/26/22 1751     07/26/22 1500  cefTRIAXone (ROCEPHIN) 2 g in sodium chloride 0.9 % 100 mL IVPB        2 g 200 mL/hr over 30 Minutes Intravenous Every 24 hours 07/26/22 1417        Subjective: Mila Homer today has no fevers,    No chest pain,    -Wife At bedside, questions answered -Tolerating liquid diet okay  Objective: Vitals:   07/26/22 1323 07/26/22 2000 07/27/22 0456 07/27/22 1332  BP: (!) 140/48 (!) 150/55 135/62 (!) 132/54  Pulse: (!) 49 (!) 49 (!) 51 66  Resp: 16 16 18 18   Temp: 97.7 F (36.5 C) 97.7 F (36.5 C) 97.8 F (36.6 C) 97.9 F (36.6 C)  TempSrc: Oral Oral Oral Oral  SpO2: 96% 97% 97% 94%  Weight:      Height:        Intake/Output Summary (Last 24 hours) at 07/27/2022 1424 Last data filed at 07/27/2022 1247 Gross per 24 hour  Intake 1867.92 ml  Output 1800 ml  Net 67.92 ml   Filed Weights   07/25/22 2245 07/26/22 0000  Weight: 93.3 kg 93.3 kg   Physical Exam  Gen:- Awake Alert,  in no apparent distress  HEENT:- [place].AT, No significant sclera icterus Ears--HOH Neck-Supple Neck,No JVD,.  Lungs-  CTAB , fair symmetrical air movement CV- S1, S2 normal, regular  Abd-  +ve B.Sounds, Abd Soft, much improved epigastric and right upper quadrant discomfort, no rebound or guarding,   Extremity/Skin:- No  edema, pedal pulses present  Psych-affect is appropriate, oriented x3 Neuro-no new focal deficits, no tremors  Data Reviewed: I have personally reviewed following labs and imaging studies  CBC: Recent Labs  Lab 07/25/22 2031 07/26/22 0411 07/27/22 0744  WBC 8.2 4.2 4.7  NEUTROABS 6.6  --   --   HGB 12.1* 11.4* 11.8*  HCT 36.6* 35.0* 36.1*  MCV 92.0 92.8 93.0  PLT 147* 134* 133*   Basic Metabolic Panel: Recent Labs  Lab 07/25/22 2031 07/26/22 0411 07/27/22 0744  NA 134* 135 137  K 4.1 4.0 3.8  CL 105 106 107  CO2 22 23 22   GLUCOSE 193* 165* 127*  BUN 24* 21 12  CREATININE 1.23 0.88 0.73  CALCIUM 9.3 9.0 8.8*  MG  --  2.4  --    GFR: Estimated Creatinine Clearance: 84.5 mL/min (by C-G formula based on SCr of 0.73 mg/dL). Liver Function Tests: Recent Labs  Lab 07/26/22 0411 07/27/22 0744  AST 888* 289*  ALT 585* 435*  ALKPHOS 149* 164*  BILITOT 1.5* 1.0  PROT 6.1* 6.0*  ALBUMIN 3.5 3.5   Radiology Studies: MR ABDOMEN MRCP W WO CONTAST  Result Date: 07/26/2022 CLINICAL DATA:  Jaundice. Gallbladder wall thickening and edema. History of colon cancer. EXAM: MRI ABDOMEN WITHOUT AND WITH CONTRAST (INCLUDING MRCP) TECHNIQUE: Multiplanar multisequence MR imaging of the abdomen was performed both before and after the administration of intravenous contrast. Heavily T2-weighted images of the biliary and pancreatic ducts were obtained, and three-dimensional MRCP images were rendered by post processing. CONTRAST:  7mL GADAVIST GADOBUTROL 1 MMOL/ML IV SOLN COMPARISON:  None Available. FINDINGS: Lower chest: Trace pleural fluid. There is a small pericardial effusion with a more prominent component towards the left side of the heart. Heart is slightly enlarged. Hepatobiliary: The liver of several small bright T2, low T1 nonenhancing foci consistent with benign cysts. Many are under a cm and too small to completely characterize but are nonaggressive and likely  benign cystic lesions or hamartomas. There is one lesion which is bright on T2 with progressive nodular enhancement in segment 4 consistent with a hemangioma. On series 25, image 53 this measures 2.4 x 1.4 cm. Second smaller hemangioma best seen on series 25, image 48 in segment 7. Patent portal vein. No intrahepatic biliary ductal dilatation. The common duct has a diameter approaching 3-4 mm. Normal tapering towards the pancreatic head The gallbladder is mildly distended. There is wall thickening as well as adjacent edema. Multiple stone seen towards the neck of the gallbladder. Question stone towards the cystic duct as well acute cholecystitis is possible. Pancreas: Global atrophy of the pancreas. No obvious mass. Slightly low T1 signal. Please correlate for any previous history of pancreatitis remotely. Simple appearing cyst once again in the body/neck of the pancreas measuring 13 mm. Spleen: Within normal limits in size and appearance. Adrenals/Urinary Tract: Adrenal glands are preserved. Kidneys are without enhancing mass. No collecting system dilatation. There is perinephric stranding. Small subcentimeter nonenhancing benign-appearing renal cysts are identified. No specific imaging follow-up. Stomach/Bowel: Stomach is nondilated. Moderate colonic stool. Small bowel is nondilated. Vascular/Lymphatic: Normal caliber aorta and IVC with some atherosclerotic changes. No specific abnormal lymph node enlargement seen in the visualized abdomen. Other: No ascites identified in the visualized abdomen. Motion artifact. Musculoskeletal: Scattered degenerative changes. IMPRESSION: Distended gallbladder with wall edema, thickening and adjacent fluid. Several stones identified towards the neck region and there is a possible stone towards the cystic duct. Please correlate for acute cholecystitis. No biliary ductal dilatation. Multiple hepatic and renal benign-appearing cysts. There are 2 lesions in the liver elsewhere  consistent with hemangiomas. Benign-appearing 13 mm pancreatic cyst. Recommend follow up imaging in 2 years. Motion artifact. Electronically Signed   By: Karen Kays M.D.   On: 07/26/2022 13:44   MR 3D Recon At Scanner  Result Date: 07/26/2022 CLINICAL DATA:  Jaundice. Gallbladder wall thickening and edema. History of colon cancer. EXAM: MRI ABDOMEN WITHOUT AND WITH CONTRAST (INCLUDING MRCP) TECHNIQUE: Multiplanar multisequence MR imaging of the abdomen was performed both before and after the administration of intravenous contrast. Heavily T2-weighted images of the biliary and pancreatic ducts were obtained, and three-dimensional MRCP images were rendered by post processing. CONTRAST:  7mL GADAVIST GADOBUTROL 1 MMOL/ML IV SOLN COMPARISON:  None Available.  FINDINGS: Lower chest: Trace pleural fluid. There is a small pericardial effusion with a more prominent component towards the left side of the heart. Heart is slightly enlarged. Hepatobiliary: The liver of several small bright T2, low T1 nonenhancing foci consistent with benign cysts. Many are under a cm and too small to completely characterize but are nonaggressive and likely benign cystic lesions or hamartomas. There is one lesion which is bright on T2 with progressive nodular enhancement in segment 4 consistent with a hemangioma. On series 25, image 53 this measures 2.4 x 1.4 cm. Second smaller hemangioma best seen on series 25, image 48 in segment 7. Patent portal vein. No intrahepatic biliary ductal dilatation. The common duct has a diameter approaching 3-4 mm. Normal tapering towards the pancreatic head The gallbladder is mildly distended. There is wall thickening as well as adjacent edema. Multiple stone seen towards the neck of the gallbladder. Question stone towards the cystic duct as well acute cholecystitis is possible. Pancreas: Global atrophy of the pancreas. No obvious mass. Slightly low T1 signal. Please correlate for any previous history of  pancreatitis remotely. Simple appearing cyst once again in the body/neck of the pancreas measuring 13 mm. Spleen: Within normal limits in size and appearance. Adrenals/Urinary Tract: Adrenal glands are preserved. Kidneys are without enhancing mass. No collecting system dilatation. There is perinephric stranding. Small subcentimeter nonenhancing benign-appearing renal cysts are identified. No specific imaging follow-up. Stomach/Bowel: Stomach is nondilated. Moderate colonic stool. Small bowel is nondilated. Vascular/Lymphatic: Normal caliber aorta and IVC with some atherosclerotic changes. No specific abnormal lymph node enlargement seen in the visualized abdomen. Other: No ascites identified in the visualized abdomen. Motion artifact. Musculoskeletal: Scattered degenerative changes. IMPRESSION: Distended gallbladder with wall edema, thickening and adjacent fluid. Several stones identified towards the neck region and there is a possible stone towards the cystic duct. Please correlate for acute cholecystitis. No biliary ductal dilatation. Multiple hepatic and renal benign-appearing cysts. There are 2 lesions in the liver elsewhere consistent with hemangiomas. Benign-appearing 13 mm pancreatic cyst. Recommend follow up imaging in 2 years. Motion artifact. Electronically Signed   By: Karen Kays M.D.   On: 07/26/2022 13:44   ECHOCARDIOGRAM COMPLETE  Result Date: 07/26/2022    ECHOCARDIOGRAM REPORT   Patient Name:   ANTAVIUS SPERBECK Date of Exam: 07/26/2022 Medical Rec #:  [id number]     Height:       71.0 in Accession #:    [id number]    Weight:       205.7 lb Date of Birth:  20-Jul-1940     BSA:          2.134 m Patient Age:    81 years      BP:           148/58 mmHg Patient Gender: M             HR:           55 bpm. [facility] Procedure: 2D Echo, Cardiac Doppler, Color Doppler, 3D Echo and Strain Analysis Indications:    Chest Pain R07.9  History:        Patient has prior history of Echocardiogram  examinations, most                 recent 05/21/2021. CAD, Pericardial Disease and Previous                 Myocardial Infarction, Arrythmias:Atrial Fibrillation,  Signs/Symptoms:Chest Pain; Risk Factors:Dyslipidemia,                 Hypertension, Diabetes and Former Smoker.  Sonographer:    Aron Baba Referring Phys: [id number] ASIA B ZIERLE-GHOSH  Sonographer Comments: Image acquisition challenging due to respiratory motion. Global longitudinal strain was attempted. IMPRESSIONS  1. Left ventricular ejection fraction, by estimation, is 70 to 75%. The left ventricle has hyperdynamic function. The left ventricle has no regional wall motion abnormalities. There is severe left ventricular hypertrophy. Left ventricular diastolic parameters are consistent with Grade I diastolic dysfunction (impaired relaxation). Elevated left atrial pressure. The average left ventricular global longitudinal strain is -20.0 %. The global longitudinal strain is normal.  2. Right ventricular systolic function is normal. The right ventricular size is normal. Tricuspid regurgitation signal is inadequate for assessing PA pressure.  3. Left atrial size was severely dilated.  4. A small pericardial effusion is present. The pericardial effusion is circumferential. There is no evidence of cardiac tamponade.  5. The mitral valve is normal in structure. No evidence of mitral valve regurgitation. No evidence of mitral stenosis.  6. The aortic valve is tricuspid. There is moderate calcification of the aortic valve. There is moderate thickening of the aortic valve. Aortic valve regurgitation is not visualized. Mild aortic valve stenosis.  7. The inferior vena cava is normal in size with greater than 50% respiratory variability, suggesting right atrial pressure of 3 mmHg. FINDINGS  Left Ventricle: Left ventricular ejection fraction, by estimation, is 70 to 75%. The left ventricle has hyperdynamic function. The left ventricle has no regional  wall motion abnormalities. The average left ventricular global longitudinal strain is -20.0  %. The global longitudinal strain is normal. The left ventricular internal cavity size was normal in size. There is severe left ventricular hypertrophy. Left ventricular diastolic parameters are consistent with Grade I diastolic dysfunction (impaired relaxation). Elevated left atrial pressure. Right Ventricle: The right ventricular size is normal. Right vetricular wall thickness was not well visualized. Right ventricular systolic function is normal. Tricuspid regurgitation signal is inadequate for assessing PA pressure. Left Atrium: Left atrial size was severely dilated. Right Atrium: Right atrial size was normal in size. Pericardium: A small pericardial effusion is present. The pericardial effusion is circumferential. There is no evidence of cardiac tamponade. Mitral Valve: The mitral valve is normal in structure. No evidence of mitral valve regurgitation. No evidence of mitral valve stenosis. Tricuspid Valve: The tricuspid valve is normal in structure. Tricuspid valve regurgitation is trivial. No evidence of tricuspid stenosis. Aortic Valve: The aortic valve is tricuspid. There is moderate calcification of the aortic valve. There is moderate thickening of the aortic valve. There is moderate aortic valve annular calcification. Aortic valve regurgitation is not visualized. Mild aortic stenosis is present. Aortic valve mean gradient measures 11.0 mmHg. Aortic valve peak gradient measures 18.8 mmHg. Aortic valve area, by VTI measures 1.94 cm. Pulmonic Valve: The pulmonic valve was not well visualized. Pulmonic valve regurgitation is mild. No evidence of pulmonic stenosis. Aorta: The aortic root is normal in size and structure. Venous: The inferior vena cava is normal in size with greater than 50% respiratory variability, suggesting right atrial pressure of 3 mmHg. IAS/Shunts: No atrial level shunt detected by color flow  Doppler.  LEFT VENTRICLE PLAX 2D LVIDd:         3.60 cm   Diastology LVIDs:         2.10 cm   LV e' medial:    3.59 cm/s LV  PW:         1.70 cm   LV E/e' medial:  27.9 LV IVS:        1.70 cm   LV e' lateral:   3.42 cm/s LVOT diam:     2.00 cm   LV E/e' lateral: 29.2 LV SV:         106 LV SV Index:   50        2D Longitudinal Strain LVOT Area:     3.14 cm  2D Strain GLS Avg:     -20.0 %                           3D Volume EF:                          3D EF:        58 %                          LV EDV:       144 ml                          LV ESV:       61 ml                          LV SV:        84 ml RIGHT VENTRICLE RV S prime:     11.20 cm/s TAPSE (M-mode): 1.6 cm LEFT ATRIUM              Index        RIGHT ATRIUM           Index LA diam:        5.00 cm  2.34 cm/m   RA Area:     18.40 cm LA Vol (A2C):   160.0 ml 74.98 ml/m  RA Volume:   38.40 ml  18.00 ml/m LA Vol (A4C):   141.0 ml 66.08 ml/m LA Biplane Vol: 155.0 ml 72.64 ml/m  AORTIC VALVE                     PULMONIC VALVE AV Area (Vmax):    2.01 cm      PR End Diast Vel: 7.29 msec AV Area (Vmean):   1.81 cm AV Area (VTI):     1.94 cm AV Vmax:           217.00 cm/s AV Vmean:          152.500 cm/s AV VTI:            0.550 m AV Peak Grad:      18.8 mmHg AV Mean Grad:      11.0 mmHg LVOT Vmax:         139.00 cm/s LVOT Vmean:        87.900 cm/s LVOT VTI:          0.339 m LVOT/AV VTI ratio: 0.62  AORTA Ao Root diam: 3.70 cm Ao Asc diam:  3.40 cm MITRAL VALVE MV Area (PHT): 1.71 cm     SHUNTS MV Decel Time: 444 msec     Systemic VTI:  0.34 m MR Peak grad: 8.9 mmHg      Systemic Diam: 2.00 cm MR Vmax:  149.00 cm/s MV E velocity: 100.00 cm/s MV A velocity: 143.00 cm/s MV E/A ratio:  0.70 Dina Rich MD Electronically signed by Dina Rich MD Signature Date/Time: 07/26/2022/12:29:25 PM    Final    US Abdomen Limited RUQ (LIVER/GB)  Result Date: 07/26/2022 CLINICAL DATA:  Abnormal liver function tests EXAM: ULTRASOUND ABDOMEN LIMITED RIGHT UPPER  QUADRANT COMPARISON:  CT 05/15/2022 FINDINGS: Gallbladder: Distended gallbladder with wall thickening measuring up to 5 mm. Wall edema as well. Possible component of sludge as well. No shadowing stones. There is a rounded area measuring 5 mm within the gallbladder lumen without shadowing. Possible polyp or tumefactive sludge. Common bile duct: Diameter: 5 mm Liver: Mildly echogenic hepatic parenchyma. Portal vein is patent on color Doppler imaging with normal direction of blood flow towards the liver. Other: None. IMPRESSION: Mild fatty liver infiltration.  No ductal dilatation. There is gallbladder wall thickening and edema with some possible sludge. There is also a nodular focus without shadowing measuring 5 mm. Possible small gallbladder polyp. Further workup as clinically appropriate such as MRI Electronically Signed   By: Karen Kays M.D.   On: 07/26/2022 10:04   DG Chest Port 1 View  Result Date: 07/25/2022 CLINICAL DATA:  Chest pain EXAM: PORTABLE CHEST 1 VIEW COMPARISON:  Chest x-ray 11/19/2021. CT abdomen and pelvis 05/15/2022. CT chest abdomen and pelvis 11/08/2021. FINDINGS: Heart is mildly enlarged, unchanged. Small nodular density seen in the right costophrenic angle, not seen on prior CT. The lungs are otherwise clear. There is no pleural effusion or pneumothorax. No acute fractures are seen. IMPRESSION: 1. No acute cardiopulmonary process. 2. Small nodular density in the right costophrenic angle, not seen on prior CT. Follow-up chest CT recommended, non emergently. Electronically Signed   By: Darliss Cheney M.D.   On: 07/25/2022 21:26     Scheduled Meds:  aspirin EC  81 mg Oral Q breakfast   benazepril  40 mg Oral Daily   diltiazem  240 mg Oral Daily   heparin injection (subcutaneous)  5,000 Units Subcutaneous Q8H   hydrALAZINE  100 mg Oral TID   insulin aspart  0-15 Units Subcutaneous TID WC   iron polysaccharides  150 mg Oral BID   metoprolol succinate  25 mg Oral Daily    pantoprazole  40 mg Oral Daily   spironolactone  12.5 mg Oral Daily   Continuous Infusions:  sodium chloride 50 mL/hr at 07/26/22 2002   cefTRIAXone (ROCEPHIN)  IV 2 g (07/26/22 1512)   metronidazole 500 mg (07/27/22 0555)    LOS: 0 days   Shon Hale M.D on 07/27/2022 at 2:24 PM  Go to [email or web address] - for contact info  [facility] - Office  [phone]  If 7PM-7AM, please contact night-coverage [email or web address] 07/27/2022, 2:24 PM

## 2022-07-27 NOTE — Progress Notes (Signed)
Subjective: Patient states that he slept well last night. He does not have any abdominal, chest pain or SOB. He denies fever and chills. Patient and his wife state understanding of the imaging study results and the presence of stones in his gallbladder. Patient maintains that he has never had pain after eating.   Objective: Vital signs in last 24 hours: Temp:  [97.7 F (36.5 C)-97.8 F (36.6 C)] 97.8 F (36.6 C) (05/02 0456) Pulse Rate:  [49-51] 51 (05/02 0456) Resp:  [16-18] 18 (05/02 0456) BP: (135-150)/(48-62) 135/62 (05/02 0456) SpO2:  [96 %-97 %] 97 % (05/02 0456) Last BM Date : 07/24/22  Intake/Output from previous day: 05/01 0701 - 05/02 0700 In: 907.9 [P.O.:240; I.V.:467.9; IV Piggyback:200] Out: 1800 [Urine:1800] Intake/Output this shift: No intake/output data recorded.  Physical Exam Constitutional:      Appearance: He is well-developed.  HENT:     Head: Normocephalic.  Eyes:     Pupils: Pupils are equal, round, and reactive to light.  Cardiovascular:     Rate and Rhythm: Normal rate and regular rhythm.     Pulses:          Radial pulses are 2+ on the right side and 2+ on the left side.       Dorsalis pedis pulses are 2+ on the right side and 2+ on the left side.     Heart sounds: Normal heart sounds.  Pulmonary:     Effort: Pulmonary effort is normal.     Breath sounds: Normal breath sounds.  Abdominal:     General: Bowel sounds are normal.     Palpations: Abdomen is soft.     Tenderness: There is no abdominal tenderness. There is no guarding or rebound.  Musculoskeletal:     Right lower leg: No edema.     Left lower leg: No edema.  Skin:    General: Skin is warm and dry.  Neurological:     Mental Status: He is alert and oriented to person, place, and time.  Psychiatric:        Mood and Affect: Mood normal.      Lab Results:  Recent Labs    07/26/22 0411 07/27/22 0744  WBC 4.2 4.7  HGB 11.4* 11.8*  HCT 35.0* 36.1*  PLT 134* 133*    BMET Recent Labs    07/26/22 0411 07/27/22 0744  NA 135 137  K 4.0 3.8  CL 106 107  CO2 23 22  GLUCOSE 165* 127*  BUN 21 12  CREATININE 0.88 0.73  CALCIUM 9.0 8.8*   PT/INR Recent Labs    07/25/22 2031  LABPROT 13.8  INR 1.1    Studies/Results: MR ABDOMEN MRCP W WO CONTAST  Result Date: 07/26/2022 CLINICAL DATA:  Jaundice. Gallbladder wall thickening and edema. History of colon cancer. EXAM: MRI ABDOMEN WITHOUT AND WITH CONTRAST (INCLUDING MRCP) TECHNIQUE: Multiplanar multisequence MR imaging of the abdomen was performed both before and after the administration of intravenous contrast. Heavily T2-weighted images of the biliary and pancreatic ducts were obtained, and three-dimensional MRCP images were rendered by post processing. CONTRAST:  7mL GADAVIST GADOBUTROL 1 MMOL/ML IV SOLN COMPARISON:  None Available. FINDINGS: Lower chest: Trace pleural fluid. There is a small pericardial effusion with a more prominent component towards the left side of the heart. Heart is slightly enlarged. Hepatobiliary: The liver of several small bright T2, low T1 nonenhancing foci consistent with benign cysts. Many are under a cm and too small to completely characterize but are  nonaggressive and likely benign cystic lesions or hamartomas. There is one lesion which is bright on T2 with progressive nodular enhancement in segment 4 consistent with a hemangioma. On series 25, image 53 this measures 2.4 x 1.4 cm. Second smaller hemangioma best seen on series 25, image 48 in segment 7. Patent portal vein. No intrahepatic biliary ductal dilatation. The common duct has a diameter approaching 3-4 mm. Normal tapering towards the pancreatic head The gallbladder is mildly distended. There is wall thickening as well as adjacent edema. Multiple stone seen towards the neck of the gallbladder. Question stone towards the cystic duct as well acute cholecystitis is possible. Pancreas: Global atrophy of the pancreas. No obvious  mass. Slightly low T1 signal. Please correlate for any previous history of pancreatitis remotely. Simple appearing cyst once again in the body/neck of the pancreas measuring 13 mm. Spleen: Within normal limits in size and appearance. Adrenals/Urinary Tract: Adrenal glands are preserved. Kidneys are without enhancing mass. No collecting system dilatation. There is perinephric stranding. Small subcentimeter nonenhancing benign-appearing renal cysts are identified. No specific imaging follow-up. Stomach/Bowel: Stomach is nondilated. Moderate colonic stool. Small bowel is nondilated. Vascular/Lymphatic: Normal caliber aorta and IVC with some atherosclerotic changes. No specific abnormal lymph node enlargement seen in the visualized abdomen. Other: No ascites identified in the visualized abdomen. Motion artifact. Musculoskeletal: Scattered degenerative changes. IMPRESSION: Distended gallbladder with wall edema, thickening and adjacent fluid. Several stones identified towards the neck region and there is a possible stone towards the cystic duct. Please correlate for acute cholecystitis. No biliary ductal dilatation. Multiple hepatic and renal benign-appearing cysts. There are 2 lesions in the liver elsewhere consistent with hemangiomas. Benign-appearing 13 mm pancreatic cyst. Recommend follow up imaging in 2 years. Motion artifact. Electronically Signed   By: Karen Kays M.D.   On: 07/26/2022 13:44   MR 3D Recon At Scanner  Result Date: 07/26/2022 CLINICAL DATA:  Jaundice. Gallbladder wall thickening and edema. History of colon cancer. EXAM: MRI ABDOMEN WITHOUT AND WITH CONTRAST (INCLUDING MRCP) TECHNIQUE: Multiplanar multisequence MR imaging of the abdomen was performed both before and after the administration of intravenous contrast. Heavily T2-weighted images of the biliary and pancreatic ducts were obtained, and three-dimensional MRCP images were rendered by post processing. CONTRAST:  7mL GADAVIST GADOBUTROL 1  MMOL/ML IV SOLN COMPARISON:  None Available. FINDINGS: Lower chest: Trace pleural fluid. There is a small pericardial effusion with a more prominent component towards the left side of the heart. Heart is slightly enlarged. Hepatobiliary: The liver of several small bright T2, low T1 nonenhancing foci consistent with benign cysts. Many are under a cm and too small to completely characterize but are nonaggressive and likely benign cystic lesions or hamartomas. There is one lesion which is bright on T2 with progressive nodular enhancement in segment 4 consistent with a hemangioma. On series 25, image 53 this measures 2.4 x 1.4 cm. Second smaller hemangioma best seen on series 25, image 48 in segment 7. Patent portal vein. No intrahepatic biliary ductal dilatation. The common duct has a diameter approaching 3-4 mm. Normal tapering towards the pancreatic head The gallbladder is mildly distended. There is wall thickening as well as adjacent edema. Multiple stone seen towards the neck of the gallbladder. Question stone towards the cystic duct as well acute cholecystitis is possible. Pancreas: Global atrophy of the pancreas. No obvious mass. Slightly low T1 signal. Please correlate for any previous history of pancreatitis remotely. Simple appearing cyst once again in the body/neck of the pancreas measuring  13 mm. Spleen: Within normal limits in size and appearance. Adrenals/Urinary Tract: Adrenal glands are preserved. Kidneys are without enhancing mass. No collecting system dilatation. There is perinephric stranding. Small subcentimeter nonenhancing benign-appearing renal cysts are identified. No specific imaging follow-up. Stomach/Bowel: Stomach is nondilated. Moderate colonic stool. Small bowel is nondilated. Vascular/Lymphatic: Normal caliber aorta and IVC with some atherosclerotic changes. No specific abnormal lymph node enlargement seen in the visualized abdomen. Other: No ascites identified in the visualized abdomen.  Motion artifact. Musculoskeletal: Scattered degenerative changes. IMPRESSION: Distended gallbladder with wall edema, thickening and adjacent fluid. Several stones identified towards the neck region and there is a possible stone towards the cystic duct. Please correlate for acute cholecystitis. No biliary ductal dilatation. Multiple hepatic and renal benign-appearing cysts. There are 2 lesions in the liver elsewhere consistent with hemangiomas. Benign-appearing 13 mm pancreatic cyst. Recommend follow up imaging in 2 years. Motion artifact. Electronically Signed   By: Karen Kays M.D.   On: 07/26/2022 13:44   ECHOCARDIOGRAM COMPLETE  Result Date: 07/26/2022    ECHOCARDIOGRAM REPORT   Patient Name:   [name] [name] Date of Exam: 07/26/2022 Medical Rec #:  [id number]     Height:       71.0 in Accession #:    [id number]    Weight:       205.7 lb Date of Birth:  1940/06/11     BSA:          2.134 m Patient Age:    81 years      BP:           148/58 mmHg Patient Gender: M             HR:           55 bpm. [facility] Procedure: 2D Echo, Cardiac Doppler, Color Doppler, 3D Echo and Strain Analysis Indications:    Chest Pain R07.9  History:        Patient has prior history of Echocardiogram examinations, most                 recent 05/21/2021. CAD, Pericardial Disease and Previous                 Myocardial Infarction, Arrythmias:Atrial Fibrillation,                 Signs/Symptoms:Chest Pain; Risk Factors:Dyslipidemia,                 Hypertension, Diabetes and Former Smoker.  Sonographer:    Aron Baba Referring Phys: [id number] ASIA B ZIERLE-GHOSH  Sonographer Comments: Image acquisition challenging due to respiratory motion. Global longitudinal strain was attempted. IMPRESSIONS  1. Left ventricular ejection fraction, by estimation, is 70 to 75%. The left ventricle has hyperdynamic function. The left ventricle has no regional wall motion abnormalities. There is severe left ventricular hypertrophy. Left  ventricular diastolic parameters are consistent with Grade I diastolic dysfunction (impaired relaxation). Elevated left atrial pressure. The average left ventricular global longitudinal strain is -20.0 %. The global longitudinal strain is normal.  2. Right ventricular systolic function is normal. The right ventricular size is normal. Tricuspid regurgitation signal is inadequate for assessing PA pressure.  3. Left atrial size was severely dilated.  4. A small pericardial effusion is present. The pericardial effusion is circumferential. There is no evidence of cardiac tamponade.  5. The mitral valve is normal in structure. No evidence of mitral valve regurgitation. No evidence of mitral stenosis.  6. The  aortic valve is tricuspid. There is moderate calcification of the aortic valve. There is moderate thickening of the aortic valve. Aortic valve regurgitation is not visualized. Mild aortic valve stenosis.  7. The inferior vena cava is normal in size with greater than 50% respiratory variability, suggesting right atrial pressure of 3 mmHg. FINDINGS  Left Ventricle: Left ventricular ejection fraction, by estimation, is 70 to 75%. The left ventricle has hyperdynamic function. The left ventricle has no regional wall motion abnormalities. The average left ventricular global longitudinal strain is -20.0  %. The global longitudinal strain is normal. The left ventricular internal cavity size was normal in size. There is severe left ventricular hypertrophy. Left ventricular diastolic parameters are consistent with Grade I diastolic dysfunction (impaired relaxation). Elevated left atrial pressure. Right Ventricle: The right ventricular size is normal. Right vetricular wall thickness was not well visualized. Right ventricular systolic function is normal. Tricuspid regurgitation signal is inadequate for assessing PA pressure. Left Atrium: Left atrial size was severely dilated. Right Atrium: Right atrial size was normal in size.  Pericardium: A small pericardial effusion is present. The pericardial effusion is circumferential. There is no evidence of cardiac tamponade. Mitral Valve: The mitral valve is normal in structure. No evidence of mitral valve regurgitation. No evidence of mitral valve stenosis. Tricuspid Valve: The tricuspid valve is normal in structure. Tricuspid valve regurgitation is trivial. No evidence of tricuspid stenosis. Aortic Valve: The aortic valve is tricuspid. There is moderate calcification of the aortic valve. There is moderate thickening of the aortic valve. There is moderate aortic valve annular calcification. Aortic valve regurgitation is not visualized. Mild aortic stenosis is present. Aortic valve mean gradient measures 11.0 mmHg. Aortic valve peak gradient measures 18.8 mmHg. Aortic valve area, by VTI measures 1.94 cm. Pulmonic Valve: The pulmonic valve was not well visualized. Pulmonic valve regurgitation is mild. No evidence of pulmonic stenosis. Aorta: The aortic root is normal in size and structure. Venous: The inferior vena cava is normal in size with greater than 50% respiratory variability, suggesting right atrial pressure of 3 mmHg. IAS/Shunts: No atrial level shunt detected by color flow Doppler.  LEFT VENTRICLE PLAX 2D LVIDd:         3.60 cm   Diastology LVIDs:         2.10 cm   LV e' medial:    3.59 cm/s LV PW:         1.70 cm   LV E/e' medial:  27.9 LV IVS:        1.70 cm   LV e' lateral:   3.42 cm/s LVOT diam:     2.00 cm   LV E/e' lateral: 29.2 LV SV:         106 LV SV Index:   50        2D Longitudinal Strain LVOT Area:     3.14 cm  2D Strain GLS Avg:     -20.0 %                           3D Volume EF:                          3D EF:        58 %                          LV EDV:  144 ml                          LV ESV:       61 ml                          LV SV:        84 ml RIGHT VENTRICLE RV S prime:     11.20 cm/s TAPSE (M-mode): 1.6 cm LEFT ATRIUM              Index        RIGHT ATRIUM            Index LA diam:        5.00 cm  2.34 cm/m   RA Area:     18.40 cm LA Vol (A2C):   160.0 ml 74.98 ml/m  RA Volume:   38.40 ml  18.00 ml/m LA Vol (A4C):   141.0 ml 66.08 ml/m LA Biplane Vol: 155.0 ml 72.64 ml/m  AORTIC VALVE                     PULMONIC VALVE AV Area (Vmax):    2.01 cm      PR End Diast Vel: 7.29 msec AV Area (Vmean):   1.81 cm AV Area (VTI):     1.94 cm AV Vmax:           217.00 cm/s AV Vmean:          152.500 cm/s AV VTI:            0.550 m AV Peak Grad:      18.8 mmHg AV Mean Grad:      11.0 mmHg LVOT Vmax:         139.00 cm/s LVOT Vmean:        87.900 cm/s LVOT VTI:          0.339 m LVOT/AV VTI ratio: 0.62  AORTA Ao Root diam: 3.70 cm Ao Asc diam:  3.40 cm MITRAL VALVE MV Area (PHT): 1.71 cm     SHUNTS MV Decel Time: 444 msec     Systemic VTI:  0.34 m MR Peak grad: 8.9 mmHg      Systemic Diam: 2.00 cm MR Vmax:      149.00 cm/s MV E velocity: 100.00 cm/s MV A velocity: 143.00 cm/s MV E/A ratio:  0.70 Dina Rich MD Electronically signed by Dina Rich MD Signature Date/Time: 07/26/2022/12:29:25 PM    Final    US Abdomen Limited RUQ (LIVER/GB)  Result Date: 07/26/2022 CLINICAL DATA:  Abnormal liver function tests EXAM: ULTRASOUND ABDOMEN LIMITED RIGHT UPPER QUADRANT COMPARISON:  CT 05/15/2022 FINDINGS: Gallbladder: Distended gallbladder with wall thickening measuring up to 5 mm. Wall edema as well. Possible component of sludge as well. No shadowing stones. There is a rounded area measuring 5 mm within the gallbladder lumen without shadowing. Possible polyp or tumefactive sludge. Common bile duct: Diameter: 5 mm Liver: Mildly echogenic hepatic parenchyma. Portal vein is patent on color Doppler imaging with normal direction of blood flow towards the liver. Other: None. IMPRESSION: Mild fatty liver infiltration.  No ductal dilatation. There is gallbladder wall thickening and edema with some possible sludge. There is also a nodular focus without shadowing measuring 5 mm.  Possible small gallbladder polyp. Further workup as clinically appropriate such as MRI Electronically Signed   By: Karen Kays M.D.   On:  07/26/2022 10:04   DG Chest Port 1 View  Result Date: 07/25/2022 CLINICAL DATA:  Chest pain EXAM: PORTABLE CHEST 1 VIEW COMPARISON:  Chest x-ray 11/19/2021. CT abdomen and pelvis 05/15/2022. CT chest abdomen and pelvis 11/08/2021. FINDINGS: Heart is mildly enlarged, unchanged. Small nodular density seen in the right costophrenic angle, not seen on prior CT. The lungs are otherwise clear. There is no pleural effusion or pneumothorax. No acute fractures are seen. IMPRESSION: 1. No acute cardiopulmonary process. 2. Small nodular density in the right costophrenic angle, not seen on prior CT. Follow-up chest CT recommended, non emergently. Electronically Signed   By: Darliss Cheney M.D.   On: 07/25/2022 21:26    Anti-infectives: Anti-infectives (From admission, onward)    Start     Dose/Rate Route Frequency Ordered Stop   07/26/22 1845  metroNIDAZOLE (FLAGYL) IVPB 500 mg        500 mg 100 mL/hr over 60 Minutes Intravenous Every 12 hours 07/26/22 1751     07/26/22 1500  cefTRIAXone (ROCEPHIN) 2 g in sodium chloride 0.9 % 100 mL IVPB        2 g 200 mL/hr over 30 Minutes Intravenous Every 24 hours 07/26/22 1417         Assessment/Plan:  #Cholelithiasis with Acute Cholecystitis  Patient's imaging studies from yesterday are consistent with several stones in the common bile duct. He is afebrile, HDS and has no abdominal pain. He is not an emergent candidate for cholecystectomy, however, given that he is inpatient currently - I recommend proceeding with cholecystectomy to prevent further episodes of pain. I would not recommend gallstone removal because gallstones have a high rate of reoccurrence. Will speak to the patient so that he is able to make an informed, shared decision in his care.    LOS: 0 days    Oscar La 07/27/2022

## 2022-07-27 NOTE — Care Management Obs Status (Signed)
MEDICARE OBSERVATION STATUS NOTIFICATION   Patient Details  Name: [name] [name] MRN: [id number] Date of Birth: [birth year]   Medicare Observation Status Notification Given:  Yes    Corey Harold 07/27/2022, 8:58 AM

## 2022-07-28 ENCOUNTER — Other Ambulatory Visit: Payer: Self-pay

## 2022-07-28 ENCOUNTER — Encounter (HOSPITAL_COMMUNITY): Payer: Self-pay | Admitting: Family Medicine

## 2022-07-28 ENCOUNTER — Inpatient Hospital Stay (HOSPITAL_COMMUNITY): Payer: Medicare HMO | Admitting: Anesthesiology

## 2022-07-28 ENCOUNTER — Encounter (HOSPITAL_COMMUNITY)
Admission: EM | Disposition: A | Discharge status: Home / Self Care | Payer: Self-pay | Source: Home / Self Care | Attending: Family Medicine

## 2022-07-28 DIAGNOSIS — E119 Type 2 diabetes mellitus without complications: Secondary | ICD-10-CM

## 2022-07-28 DIAGNOSIS — K810 Acute cholecystitis: Secondary | ICD-10-CM

## 2022-07-28 DIAGNOSIS — Z87891 Personal history of nicotine dependence: Secondary | ICD-10-CM

## 2022-07-28 DIAGNOSIS — K8000 Calculus of gallbladder with acute cholecystitis without obstruction: Secondary | ICD-10-CM | POA: Diagnosis not present

## 2022-07-28 DIAGNOSIS — I10 Essential (primary) hypertension: Secondary | ICD-10-CM

## 2022-07-28 DIAGNOSIS — I2510 Atherosclerotic heart disease of native coronary artery without angina pectoris: Secondary | ICD-10-CM

## 2022-07-28 DIAGNOSIS — I252 Old myocardial infarction: Secondary | ICD-10-CM

## 2022-07-28 DIAGNOSIS — K8001 Calculus of gallbladder with acute cholecystitis with obstruction: Secondary | ICD-10-CM | POA: Diagnosis not present

## 2022-07-28 LAB — CBC
HCT: 34.1 % — ABNORMAL LOW (ref 39.0–52.0)
Hemoglobin: 11.2 g/dL — ABNORMAL LOW (ref 13.0–17.0)
MCH: 30.4 pg (ref 26.0–34.0)
MCHC: 32.8 g/dL (ref 30.0–36.0)
MCV: 92.4 fL (ref 80.0–100.0)
Platelets: 131 10*3/uL — ABNORMAL LOW (ref 150–400)
RBC: 3.69 MIL/uL — ABNORMAL LOW (ref 4.22–5.81)
RDW: 15.1 % (ref 11.5–15.5)
WBC: 4.4 10*3/uL (ref 4.0–10.5)
nRBC: 0 % (ref 0.0–0.2)

## 2022-07-28 LAB — GLUCOSE, CAPILLARY
Glucose-Capillary: 131 mg/dL — ABNORMAL HIGH (ref 70–99)
Glucose-Capillary: 158 mg/dL — ABNORMAL HIGH (ref 70–99)
Glucose-Capillary: 152 mg/dL — ABNORMAL HIGH (ref 70–99)
Glucose-Capillary: 302 mg/dL — ABNORMAL HIGH (ref 70–99)

## 2022-07-28 LAB — COMPREHENSIVE METABOLIC PANEL
ALT: 281 U/L — ABNORMAL HIGH (ref 0–44)
AST: 127 U/L — ABNORMAL HIGH (ref 15–41)
Albumin: 3.2 g/dL — ABNORMAL LOW (ref 3.5–5.0)
Alkaline Phosphatase: 140 U/L — ABNORMAL HIGH (ref 38–126)
Anion gap: 7 (ref 5–15)
BUN: 11 mg/dL (ref 8–23)
CO2: 22 mmol/L (ref 22–32)
Calcium: 8.9 mg/dL (ref 8.9–10.3)
Chloride: 108 mmol/L (ref 98–111)
Creatinine, Ser: 0.74 mg/dL (ref 0.61–1.24)
GFR, Estimated: >60 mL/min (ref >60)
Glucose, Bld: 143 mg/dL — ABNORMAL HIGH (ref 70–99)
Potassium: 3.7 mmol/L (ref 3.5–5.1)
Sodium: 137 mmol/L (ref 135–145)
Total Bilirubin: 0.8 mg/dL (ref 0.3–1.2)
Total Protein: 5.8 g/dL — ABNORMAL LOW (ref 6.5–8.1)

## 2022-07-28 LAB — SURGICAL PCR SCREEN
MRSA, PCR: NEGATIVE
Staphylococcus aureus: POSITIVE — AB

## 2022-07-28 SURGERY — CHOLECYSTECTOMY, ROBOT-ASSISTED, LAPAROSCOPIC
Anesthesia: General | Site: Abdomen

## 2022-07-28 MED ORDER — ONDANSETRON HCL 4 MG/2ML IJ SOLN
INTRAMUSCULAR | Status: AC
  Filled 2022-07-28: qty 2

## 2022-07-28 MED ORDER — ONDANSETRON HCL 4 MG/2ML IJ SOLN
INTRAMUSCULAR | Status: DC | PRN
  Administered 2022-07-28: 4 mg via INTRAVENOUS

## 2022-07-28 MED ORDER — MUPIROCIN 2 % EX OINT
1.0000 | TOPICAL_OINTMENT | Freq: Two times a day (BID) | CUTANEOUS | Status: DC
  Administered 2022-07-28 – 2022-07-29 (×4): 1 via NASAL
  Filled 2022-07-28: qty 22

## 2022-07-28 MED ORDER — FENTANYL CITRATE PF 50 MCG/ML IJ SOSY
25.0000 ug | PREFILLED_SYRINGE | INTRAMUSCULAR | Status: DC | PRN
  Administered 2022-07-28 (×2): 50 ug via INTRAVENOUS
  Filled 2022-07-28 (×2): qty 1

## 2022-07-28 MED ORDER — LACTATED RINGERS IV SOLN
INTRAVENOUS | Status: DC | PRN
  Administered 2022-07-28 (×2): 1000 mL via INTRAVENOUS

## 2022-07-28 MED ORDER — INDOCYANINE GREEN 25 MG IV SOLR
INTRAVENOUS | Status: AC
  Filled 2022-07-28: qty 10

## 2022-07-28 MED ORDER — BUPIVACAINE HCL (PF) 0.5 % IJ SOLN
INTRAMUSCULAR | Status: AC
  Filled 2022-07-28: qty 30

## 2022-07-28 MED ORDER — CHLORHEXIDINE GLUCONATE 0.12 % MT SOLN
15.0000 mL | Freq: Once | OROMUCOSAL | Status: AC
  Administered 2022-07-28: 15 mL via OROMUCOSAL

## 2022-07-28 MED ORDER — SUGAMMADEX SODIUM 200 MG/2ML IV SOLN
INTRAVENOUS | Status: DC | PRN
  Administered 2022-07-28: 200 mg via INTRAVENOUS

## 2022-07-28 MED ORDER — LACTATED RINGERS IV SOLN: INTRAVENOUS | Status: DC

## 2022-07-28 MED ORDER — BUPIVACAINE HCL (PF) 0.5 % IJ SOLN
INTRAMUSCULAR | Status: DC | PRN
  Administered 2022-07-28: 30 mL

## 2022-07-28 MED ORDER — ONDANSETRON HCL 4 MG/2ML IJ SOLN: 4.0000 mg | Freq: Once | INTRAMUSCULAR | Status: DC | PRN

## 2022-07-28 MED ORDER — DEXAMETHASONE SODIUM PHOSPHATE 10 MG/ML IJ SOLN
INTRAMUSCULAR | Status: AC
  Filled 2022-07-28: qty 1

## 2022-07-28 MED ORDER — STERILE WATER FOR IRRIGATION IR SOLN
Status: DC | PRN
  Administered 2022-07-28: 1000 mL

## 2022-07-28 MED ORDER — FENTANYL CITRATE (PF) 100 MCG/2ML IJ SOLN
INTRAMUSCULAR | Status: DC | PRN
  Administered 2022-07-28: 50 ug via INTRAVENOUS
  Administered 2022-07-28 (×2): 25 ug via INTRAVENOUS

## 2022-07-28 MED ORDER — ORAL CARE MOUTH RINSE: 15.0000 mL | Freq: Once | OROMUCOSAL | Status: AC

## 2022-07-28 MED ORDER — DEXAMETHASONE SODIUM PHOSPHATE 10 MG/ML IJ SOLN
INTRAMUSCULAR | Status: DC | PRN
  Administered 2022-07-28: 8 mg via INTRAVENOUS

## 2022-07-28 MED ORDER — MELATONIN 3 MG PO TABS
6.0000 mg | ORAL_TABLET | Freq: Once | ORAL | Status: AC
  Administered 2022-07-29: 6 mg via ORAL
  Filled 2022-07-28: qty 2

## 2022-07-28 MED ORDER — MORPHINE SULFATE (PF) 2 MG/ML IV SOLN: 2.0000 mg | INTRAVENOUS | Status: DC | PRN

## 2022-07-28 MED ORDER — OXYCODONE HCL 5 MG/5ML PO SOLN: 5.0000 mg | Freq: Once | ORAL | Status: DC | PRN

## 2022-07-28 MED ORDER — ROCURONIUM BROMIDE 10 MG/ML (PF) SYRINGE
PREFILLED_SYRINGE | INTRAVENOUS | Status: AC
  Filled 2022-07-28: qty 10

## 2022-07-28 MED ORDER — FENTANYL CITRATE (PF) 100 MCG/2ML IJ SOLN
INTRAMUSCULAR | Status: AC
  Filled 2022-07-28: qty 2

## 2022-07-28 MED ORDER — ACETAMINOPHEN 500 MG PO TABS
1000.0000 mg | ORAL_TABLET | Freq: Four times a day (QID) | ORAL | Status: DC
  Administered 2022-07-28 – 2022-07-29 (×3): 1000 mg via ORAL
  Filled 2022-07-28 (×3): qty 2

## 2022-07-28 MED ORDER — SEVOFLURANE IN SOLN
RESPIRATORY_TRACT | Status: AC
  Filled 2022-07-28: qty 250

## 2022-07-28 MED ORDER — SCOPOLAMINE 1 MG/3DAYS TD PT72
MEDICATED_PATCH | TRANSDERMAL | Status: AC
  Filled 2022-07-28: qty 1

## 2022-07-28 MED ORDER — SCOPOLAMINE 1 MG/3DAYS TD PT72
1.0000 | MEDICATED_PATCH | Freq: Once | TRANSDERMAL | Status: DC
  Administered 2022-07-28: 1.5 mg via TRANSDERMAL

## 2022-07-28 MED ORDER — ROCURONIUM 10MG/ML (10ML) SYRINGE FOR MEDFUSION PUMP - OPTIME
INTRAVENOUS | Status: DC | PRN
  Administered 2022-07-28: 50 mg via INTRAVENOUS
  Administered 2022-07-28: 10 mg via INTRAVENOUS

## 2022-07-28 MED ORDER — EPHEDRINE 5 MG/ML INJ
INTRAVENOUS | Status: AC
  Filled 2022-07-28: qty 5

## 2022-07-28 MED ORDER — LIDOCAINE HCL (CARDIAC) PF 50 MG/5ML IV SOSY
PREFILLED_SYRINGE | INTRAVENOUS | Status: DC | PRN
  Administered 2022-07-28: 80 mg via INTRAVENOUS

## 2022-07-28 MED ORDER — INDOCYANINE GREEN 25 MG IV SOLR
2.5000 mg | Freq: Once | INTRAVENOUS | Status: AC
  Administered 2022-07-28: 2.5 mg via INTRAVENOUS

## 2022-07-28 MED ORDER — EPHEDRINE SULFATE (PRESSORS) 50 MG/ML IJ SOLN
INTRAMUSCULAR | Status: DC | PRN
  Administered 2022-07-28: 5 mg via INTRAVENOUS
  Administered 2022-07-28: 10 mg via INTRAVENOUS

## 2022-07-28 MED ORDER — OXYCODONE HCL 5 MG PO TABS: 5.0000 mg | ORAL_TABLET | Freq: Once | ORAL | Status: DC | PRN

## 2022-07-28 MED ORDER — PROPOFOL 10 MG/ML IV BOLUS
INTRAVENOUS | Status: DC | PRN
  Administered 2022-07-28: 120 mg via INTRAVENOUS

## 2022-07-28 MED ORDER — LIDOCAINE HCL (PF) 2 % IJ SOLN
INTRAMUSCULAR | Status: AC
  Filled 2022-07-28: qty 5

## 2022-07-28 SURGICAL SUPPLY — 48 items
ADH SKN CLS APL DERMABOND .7 (GAUZE/BANDAGES/DRESSINGS) ×1
APL PRP STRL LF DISP 70% ISPRP (MISCELLANEOUS) ×1
BLADE SURG 15 STRL LF DISP TIS (BLADE) ×2 IMPLANT
BLADE SURG 15 STRL SS (BLADE) ×1
CANNULA REDUCER 12-8 DVNC XI (CANNULA) IMPLANT
CAUTERY HOOK MNPLR 1.6 DVNC XI (INSTRUMENTS) ×4 IMPLANT
CHLORAPREP W/TINT 26 (MISCELLANEOUS) ×2 IMPLANT
CLIP LIGATING HEM O LOK PURPLE (MISCELLANEOUS) ×2 IMPLANT
COVER TIP SHEARS 8 DVNC (MISCELLANEOUS) ×2 IMPLANT
DEFOGGER SCOPE WARMER CLEARIFY (MISCELLANEOUS) IMPLANT
DERMABOND ADVANCED .7 DNX12 (GAUZE/BANDAGES/DRESSINGS) ×2 IMPLANT
DRAPE ARM DVNC X/XI (DISPOSABLE) ×8 IMPLANT
DRAPE COLUMN DVNC XI (DISPOSABLE) ×2 IMPLANT
ELECT REM PT RETURN 9FT ADLT (ELECTROSURGICAL) ×1
ELECTRODE REM PT RTRN 9FT ADLT (ELECTROSURGICAL) ×2 IMPLANT
FORCEPS BPLR R/ABLATION 8 DVNC (INSTRUMENTS) ×2 IMPLANT
FORCEPS PROGRASP DVNC XI (FORCEP) ×4 IMPLANT
GLOVE BIOGEL PI IND STRL 6.5 (GLOVE) ×4 IMPLANT
GLOVE BIOGEL PI IND STRL 7.0 (GLOVE) ×6 IMPLANT
GLOVE SURG SS PI 6.5 STRL IVOR (GLOVE) ×4 IMPLANT
GOWN STRL REUS W/TWL LRG LVL3 (GOWN DISPOSABLE) ×6 IMPLANT
GRASPER SUT TROCAR 14GX15 (MISCELLANEOUS) ×2 IMPLANT
IRRIGATOR SUCT 8 DISP DVNC XI (IRRIGATION / IRRIGATOR) IMPLANT
IV NS IRRIG 3000ML ARTHROMATIC (IV SOLUTION) IMPLANT
KIT TURNOVER KIT A (KITS) ×2 IMPLANT
MANIFOLD NEPTUNE II (INSTRUMENTS) ×2 IMPLANT
NDL HYPO 21X1.5 SAFETY (NEEDLE) ×2 IMPLANT
NDL INSUFFLATION 14GA 120MM (NEEDLE) ×2 IMPLANT
NEEDLE HYPO 21X1.5 SAFETY (NEEDLE) ×1 IMPLANT
NEEDLE INSUFFLATION 14GA 120MM (NEEDLE) ×1 IMPLANT
OBTURATOR OPTICAL STND 8 DVNC (TROCAR) ×1
OBTURATOR OPTICALSTD 8 DVNC (TROCAR) ×2 IMPLANT
PACK LAP CHOLE LZT030E (CUSTOM PROCEDURE TRAY) ×2 IMPLANT
PAD ARMBOARD 7.5X6 YLW CONV (MISCELLANEOUS) ×2 IMPLANT
PENCIL HANDSWITCHING (ELECTRODE) IMPLANT
SCISSORS MNPLR CVD DVNC XI (INSTRUMENTS) ×4 IMPLANT
SEAL CANN UNIV 5-8 DVNC XI (MISCELLANEOUS) ×6 IMPLANT
SET BASIN LINEN APH (SET/KITS/TRAYS/PACK) ×2 IMPLANT
SET TUBE SMOKE EVAC HIGH FLOW (TUBING) ×2 IMPLANT
STAPLER CANNULA SEAL DVNC XI (STAPLE) IMPLANT
SUT MNCRL AB 4-0 PS2 18 (SUTURE) ×4 IMPLANT
SUT VICRYL 0 AB UR-6 (SUTURE) IMPLANT
SYR 30ML LL (SYRINGE) ×2 IMPLANT
SYS BAG RETRIEVAL 10MM (BASKET)
SYS RETRIEVAL 5MM INZII UNIV (BASKET) ×1
SYSTEM BAG RETRIEVAL 10MM (BASKET) IMPLANT
SYSTEM RETRIEVL 5MM INZII UNIV (BASKET) IMPLANT
WATER STERILE IRR 500ML POUR (IV SOLUTION) ×2 IMPLANT

## 2022-07-28 NOTE — Progress Notes (Signed)
Positive MRSA swab. Notified Dr. Thomes Dinning.

## 2022-07-28 NOTE — Transfer of Care (Signed)
Immediate Anesthesia Transfer of Care Note  Patient: [name] [name]  Procedure(s) Performed: XI ROBOTIC ASSISTED LAPAROSCOPIC CHOLECYSTECTOMY (Abdomen)  Patient Location: Short Stay  Anesthesia Type:General  Level of Consciousness: awake  Airway & Oxygen Therapy: Patient Spontanous Breathing  Post-op Assessment: Report given to RN  Post vital signs: Reviewed and stable  Last Vitals:  Vitals Value Taken Time  BP 179/71 07/28/22 1234  Temp    Pulse 62 07/28/22 1235  Resp 11 07/28/22 1238  SpO2 96 % 07/28/22 1235  Vitals shown include unvalidated device data.  Last Pain:  Vitals:   07/28/22 0838  TempSrc: Oral  PainSc: 0-No pain         Complications: No notable events documented.

## 2022-07-28 NOTE — Anesthesia Procedure Notes (Signed)
Procedure Name: Intubation Date/Time: 07/28/2022 10:27 AM  Performed by: Moshe Salisbury, CRNAPre-anesthesia Checklist: Patient identified, Patient being monitored, Timeout performed, Emergency Drugs available and Suction available Patient Re-evaluated:Patient Re-evaluated prior to induction Oxygen Delivery Method: Circle system utilized Preoxygenation: Pre-oxygenation with 100% oxygen Induction Type: IV induction Ventilation: Mask ventilation without difficulty Laryngoscope Size: Mac and 3 Grade View: Grade I Tube type: Oral Tube size: 7.5 mm Number of attempts: 1 Airway Equipment and Method: Stylet Placement Confirmation: ETT inserted through vocal cords under direct vision, positive ETCO2 and breath sounds checked- equal and bilateral Secured at: 21 cm Tube secured with: Tape Dental Injury: Teeth and Oropharynx as per pre-operative assessment

## 2022-07-28 NOTE — Progress Notes (Addendum)
Subjective: Patient states that [name] [name] is feeling fine. [name] [name] slept okay last night. Has not had any abdominal pain. [name] [name] and his wife express knowledge of his cholecystectomy and its indication. All questions were answered to their satisfaction.  Objective: Vital signs in last 24 hours: Temp:  [97.9 F (36.6 C)-98.8 F (37.1 C)] 98.5 F (36.9 C) (05/03 0427) Pulse Rate:  [52-66] 52 (05/03 0427) Resp:  [14-20] 14 (05/03 0500) BP: (132-148)/(52-54) 148/52 (05/03 0427) SpO2:  [94 %-97 %] 97 % (05/03 0427) Last BM Date : 07/24/22  Intake/Output from previous day: 05/02 0701 - 05/03 0700 In: 2170 [P.O.:1440; I.V.:530; IV Piggyback:200] Out: -  Intake/Output this shift: No intake/output data recorded.  Physical Exam Constitutional:      Appearance: [name] [name] is well-developed.  HENT:     Head: Normocephalic.  Cardiovascular:     Rate and Rhythm: Normal rate and regular rhythm.     Pulses:          Radial pulses are 2+ on the right side and 2+ on the left side.       Dorsalis pedis pulses are 2+ on the right side and 2+ on the left side.     Heart sounds: Normal heart sounds.  Pulmonary:     Effort: Pulmonary effort is normal.     Breath sounds: Normal breath sounds.  Abdominal:     General: Bowel sounds are normal.     Palpations: Abdomen is soft.  Skin:    General: Skin is warm and dry.  Neurological:     Mental Status: [name] [name] is alert.      Lab Results:  Recent Labs    07/27/22 0744 07/28/22 0519  WBC 4.7 4.4  HGB 11.8* 11.2*  HCT 36.1* 34.1*  PLT 133* 131*   BMET Recent Labs    07/27/22 0744 07/28/22 0519  NA 137 137  K 3.8 3.7  CL 107 108  CO2 22 22  GLUCOSE 127* 143*  BUN 12 11  CREATININE 0.73 0.74  CALCIUM 8.8* 8.9   PT/INR Recent Labs    07/25/22 2031  LABPROT 13.8  INR 1.1    Studies/Results: MR ABDOMEN MRCP W WO CONTAST  Result Date: 07/26/2022 CLINICAL DATA:  Jaundice. Gallbladder wall thickening and edema. History of colon cancer. EXAM: MRI  ABDOMEN WITHOUT AND WITH CONTRAST (INCLUDING MRCP) TECHNIQUE: Multiplanar multisequence MR imaging of the abdomen was performed both before and after the administration of intravenous contrast. Heavily T2-weighted images of the biliary and pancreatic ducts were obtained, and three-dimensional MRCP images were rendered by post processing. CONTRAST:  7mL GADAVIST GADOBUTROL 1 MMOL/ML IV SOLN COMPARISON:  None Available. FINDINGS: Lower chest: Trace pleural fluid. There is a small pericardial effusion with a more prominent component towards the left side of the heart. Heart is slightly enlarged. Hepatobiliary: The liver of several small bright T2, low T1 nonenhancing foci consistent with benign cysts. Many are under a cm and too small to completely characterize but are nonaggressive and likely benign cystic lesions or hamartomas. There is one lesion which is bright on T2 with progressive nodular enhancement in segment 4 consistent with a hemangioma. On series 25, image 53 this measures 2.4 x 1.4 cm. Second smaller hemangioma best seen on series 25, image 48 in segment 7. Patent portal vein. No intrahepatic biliary ductal dilatation. The common duct has a diameter approaching 3-4 mm. Normal tapering towards the pancreatic head The gallbladder is mildly distended. There is wall thickening as well as adjacent  edema. Multiple stone seen towards the neck of the gallbladder. Question stone towards the cystic duct as well acute cholecystitis is possible. Pancreas: Global atrophy of the pancreas. No obvious mass. Slightly low T1 signal. Please correlate for any previous history of pancreatitis remotely. Simple appearing cyst once again in the body/neck of the pancreas measuring 13 mm. Spleen: Within normal limits in size and appearance. Adrenals/Urinary Tract: Adrenal glands are preserved. Kidneys are without enhancing mass. No collecting system dilatation. There is perinephric stranding. Small subcentimeter nonenhancing  benign-appearing renal cysts are identified. No specific imaging follow-up. Stomach/Bowel: Stomach is nondilated. Moderate colonic stool. Small bowel is nondilated. Vascular/Lymphatic: Normal caliber aorta and IVC with some atherosclerotic changes. No specific abnormal lymph node enlargement seen in the visualized abdomen. Other: No ascites identified in the visualized abdomen. Motion artifact. Musculoskeletal: Scattered degenerative changes. IMPRESSION: Distended gallbladder with wall edema, thickening and adjacent fluid. Several stones identified towards the neck region and there is a possible stone towards the cystic duct. Please correlate for acute cholecystitis. No biliary ductal dilatation. Multiple hepatic and renal benign-appearing cysts. There are 2 lesions in the liver elsewhere consistent with hemangiomas. Benign-appearing 13 mm pancreatic cyst. Recommend follow up imaging in 2 years. Motion artifact. Electronically Signed   By: Karen Kays M.D.   On: 07/26/2022 13:44   MR 3D Recon At Scanner  Result Date: 07/26/2022 CLINICAL DATA:  Jaundice. Gallbladder wall thickening and edema. History of colon cancer. EXAM: MRI ABDOMEN WITHOUT AND WITH CONTRAST (INCLUDING MRCP) TECHNIQUE: Multiplanar multisequence MR imaging of the abdomen was performed both before and after the administration of intravenous contrast. Heavily T2-weighted images of the biliary and pancreatic ducts were obtained, and three-dimensional MRCP images were rendered by post processing. CONTRAST:  7mL GADAVIST GADOBUTROL 1 MMOL/ML IV SOLN COMPARISON:  None Available. FINDINGS: Lower chest: Trace pleural fluid. There is a small pericardial effusion with a more prominent component towards the left side of the heart. Heart is slightly enlarged. Hepatobiliary: The liver of several small bright T2, low T1 nonenhancing foci consistent with benign cysts. Many are under a cm and too small to completely characterize but are nonaggressive and likely  benign cystic lesions or hamartomas. There is one lesion which is bright on T2 with progressive nodular enhancement in segment 4 consistent with a hemangioma. On series 25, image 53 this measures 2.4 x 1.4 cm. Second smaller hemangioma best seen on series 25, image 48 in segment 7. Patent portal vein. No intrahepatic biliary ductal dilatation. The common duct has a diameter approaching 3-4 mm. Normal tapering towards the pancreatic head The gallbladder is mildly distended. There is wall thickening as well as adjacent edema. Multiple stone seen towards the neck of the gallbladder. Question stone towards the cystic duct as well acute cholecystitis is possible. Pancreas: Global atrophy of the pancreas. No obvious mass. Slightly low T1 signal. Please correlate for any previous history of pancreatitis remotely. Simple appearing cyst once again in the body/neck of the pancreas measuring 13 mm. Spleen: Within normal limits in size and appearance. Adrenals/Urinary Tract: Adrenal glands are preserved. Kidneys are without enhancing mass. No collecting system dilatation. There is perinephric stranding. Small subcentimeter nonenhancing benign-appearing renal cysts are identified. No specific imaging follow-up. Stomach/Bowel: Stomach is nondilated. Moderate colonic stool. Small bowel is nondilated. Vascular/Lymphatic: Normal caliber aorta and IVC with some atherosclerotic changes. No specific abnormal lymph node enlargement seen in the visualized abdomen. Other: No ascites identified in the visualized abdomen. Motion artifact. Musculoskeletal: Scattered degenerative changes. IMPRESSION:  Distended gallbladder with wall edema, thickening and adjacent fluid. Several stones identified towards the neck region and there is a possible stone towards the cystic duct. Please correlate for acute cholecystitis. No biliary ductal dilatation. Multiple hepatic and renal benign-appearing cysts. There are 2 lesions in the liver elsewhere  consistent with hemangiomas. Benign-appearing 13 mm pancreatic cyst. Recommend follow up imaging in 2 years. Motion artifact. Electronically Signed   By: Karen Kays M.D.   On: 07/26/2022 13:44   ECHOCARDIOGRAM COMPLETE  Result Date: 07/26/2022    ECHOCARDIOGRAM REPORT   Patient Name:   [name] [name] Date of Exam: 07/26/2022 Medical Rec #:  [id number]     Height:       71.0 in Accession #:    [id number]    Weight:       205.7 lb Date of Birth:  1940-04-20     BSA:          2.134 m Patient Age:    81 years      BP:           148/58 mmHg Patient Gender: M             HR:           55 bpm. [facility] Procedure: 2D Echo, Cardiac Doppler, Color Doppler, 3D Echo and Strain Analysis Indications:    Chest Pain R07.9  History:        Patient has prior history of Echocardiogram examinations, most                 recent 05/21/2021. CAD, Pericardial Disease and Previous                 Myocardial Infarction, Arrythmias:Atrial Fibrillation,                 Signs/Symptoms:Chest Pain; Risk Factors:Dyslipidemia,                 Hypertension, Diabetes and Former Smoker.  Sonographer:    Aron Baba Referring Phys: [id number] ASIA B ZIERLE-GHOSH  Sonographer Comments: Image acquisition challenging due to respiratory motion. Global longitudinal strain was attempted. IMPRESSIONS  1. Left ventricular ejection fraction, by estimation, is 70 to 75%. The left ventricle has hyperdynamic function. The left ventricle has no regional wall motion abnormalities. There is severe left ventricular hypertrophy. Left ventricular diastolic parameters are consistent with Grade I diastolic dysfunction (impaired relaxation). Elevated left atrial pressure. The average left ventricular global longitudinal strain is -20.0 %. The global longitudinal strain is normal.  2. Right ventricular systolic function is normal. The right ventricular size is normal. Tricuspid regurgitation signal is inadequate for assessing PA pressure.  3. Left atrial  size was severely dilated.  4. A small pericardial effusion is present. The pericardial effusion is circumferential. There is no evidence of cardiac tamponade.  5. The mitral valve is normal in structure. No evidence of mitral valve regurgitation. No evidence of mitral stenosis.  6. The aortic valve is tricuspid. There is moderate calcification of the aortic valve. There is moderate thickening of the aortic valve. Aortic valve regurgitation is not visualized. Mild aortic valve stenosis.  7. The inferior vena cava is normal in size with greater than 50% respiratory variability, suggesting right atrial pressure of 3 mmHg. FINDINGS  Left Ventricle: Left ventricular ejection fraction, by estimation, is 70 to 75%. The left ventricle has hyperdynamic function. The left ventricle has no regional wall motion abnormalities. The average left ventricular global longitudinal  strain is -20.0  %. The global longitudinal strain is normal. The left ventricular internal cavity size was normal in size. There is severe left ventricular hypertrophy. Left ventricular diastolic parameters are consistent with Grade I diastolic dysfunction (impaired relaxation). Elevated left atrial pressure. Right Ventricle: The right ventricular size is normal. Right vetricular wall thickness was not well visualized. Right ventricular systolic function is normal. Tricuspid regurgitation signal is inadequate for assessing PA pressure. Left Atrium: Left atrial size was severely dilated. Right Atrium: Right atrial size was normal in size. Pericardium: A small pericardial effusion is present. The pericardial effusion is circumferential. There is no evidence of cardiac tamponade. Mitral Valve: The mitral valve is normal in structure. No evidence of mitral valve regurgitation. No evidence of mitral valve stenosis. Tricuspid Valve: The tricuspid valve is normal in structure. Tricuspid valve regurgitation is trivial. No evidence of tricuspid stenosis. Aortic  Valve: The aortic valve is tricuspid. There is moderate calcification of the aortic valve. There is moderate thickening of the aortic valve. There is moderate aortic valve annular calcification. Aortic valve regurgitation is not visualized. Mild aortic stenosis is present. Aortic valve mean gradient measures 11.0 mmHg. Aortic valve peak gradient measures 18.8 mmHg. Aortic valve area, by VTI measures 1.94 cm. Pulmonic Valve: The pulmonic valve was not well visualized. Pulmonic valve regurgitation is mild. No evidence of pulmonic stenosis. Aorta: The aortic root is normal in size and structure. Venous: The inferior vena cava is normal in size with greater than 50% respiratory variability, suggesting right atrial pressure of 3 mmHg. IAS/Shunts: No atrial level shunt detected by color flow Doppler.  LEFT VENTRICLE PLAX 2D LVIDd:         3.60 cm   Diastology LVIDs:         2.10 cm   LV e' medial:    3.59 cm/s LV PW:         1.70 cm   LV E/e' medial:  27.9 LV IVS:        1.70 cm   LV e' lateral:   3.42 cm/s LVOT diam:     2.00 cm   LV E/e' lateral: 29.2 LV SV:         106 LV SV Index:   50        2D Longitudinal Strain LVOT Area:     3.14 cm  2D Strain GLS Avg:     -20.0 %                           3D Volume EF:                          3D EF:        58 %                          LV EDV:       144 ml                          LV ESV:       61 ml                          LV SV:        84 ml RIGHT VENTRICLE RV S prime:     11.20 cm/s TAPSE (M-mode): 1.6  cm LEFT ATRIUM              Index        RIGHT ATRIUM           Index LA diam:        5.00 cm  2.34 cm/m   RA Area:     18.40 cm LA Vol (A2C):   160.0 ml 74.98 ml/m  RA Volume:   38.40 ml  18.00 ml/m LA Vol (A4C):   141.0 ml 66.08 ml/m LA Biplane Vol: 155.0 ml 72.64 ml/m  AORTIC VALVE                     PULMONIC VALVE AV Area (Vmax):    2.01 cm      PR End Diast Vel: 7.29 msec AV Area (Vmean):   1.81 cm AV Area (VTI):     1.94 cm AV Vmax:           217.00 cm/s AV  Vmean:          152.500 cm/s AV VTI:            0.550 m AV Peak Grad:      18.8 mmHg AV Mean Grad:      11.0 mmHg LVOT Vmax:         139.00 cm/s LVOT Vmean:        87.900 cm/s LVOT VTI:          0.339 m LVOT/AV VTI ratio: 0.62  AORTA Ao Root diam: 3.70 cm Ao Asc diam:  3.40 cm MITRAL VALVE MV Area (PHT): 1.71 cm     SHUNTS MV Decel Time: 444 msec     Systemic VTI:  0.34 m MR Peak grad: 8.9 mmHg      Systemic Diam: 2.00 cm MR Vmax:      149.00 cm/s MV E velocity: 100.00 cm/s MV A velocity: 143.00 cm/s MV E/A ratio:  0.70 Dina Rich MD Electronically signed by Dina Rich MD Signature Date/Time: 07/26/2022/12:29:25 PM    Final    US Abdomen Limited RUQ (LIVER/GB)  Result Date: 07/26/2022 CLINICAL DATA:  Abnormal liver function tests EXAM: ULTRASOUND ABDOMEN LIMITED RIGHT UPPER QUADRANT COMPARISON:  CT 05/15/2022 FINDINGS: Gallbladder: Distended gallbladder with wall thickening measuring up to 5 mm. Wall edema as well. Possible component of sludge as well. No shadowing stones. There is a rounded area measuring 5 mm within the gallbladder lumen without shadowing. Possible polyp or tumefactive sludge. Common bile duct: Diameter: 5 mm Liver: Mildly echogenic hepatic parenchyma. Portal vein is patent on color Doppler imaging with normal direction of blood flow towards the liver. Other: None. IMPRESSION: Mild fatty liver infiltration.  No ductal dilatation. There is gallbladder wall thickening and edema with some possible sludge. There is also a nodular focus without shadowing measuring 5 mm. Possible small gallbladder polyp. Further workup as clinically appropriate such as MRI Electronically Signed   By: Karen Kays M.D.   On: 07/26/2022 10:04    Anti-infectives: Anti-infectives (From admission, onward)    Start     Dose/Rate Route Frequency Ordered Stop   07/26/22 1845  metroNIDAZOLE (FLAGYL) IVPB 500 mg        500 mg 100 mL/hr over 60 Minutes Intravenous Every 12 hours 07/26/22 1751     07/26/22 1500   cefTRIAXone (ROCEPHIN) 2 g in sodium chloride 0.9 % 100 mL IVPB        2 g 200 mL/hr over 30 Minutes Intravenous  Every 24 hours 07/26/22 1417         Assessment/Plan:  [name] [name] is an 82 y.o. male with a hx of CAD (s/p DES to LCx in 12/2017), PAFib, HTN, HLD, Type 2 DM who presented with epigastric pain and acute elevation of LFTs and was admitted for acute cholecystitis.  #Cholethiasis with Acute Cholecystitis Patient is having a robotic assisted laparoscopic cholecystectomy today at 9 AM. [name] [name] is HDS and afebrile. Lab work is within normal limits. [name] [name] is ready to proceed today with surgery. I anticipate him staying a few days post-op to monitor him for any signs of complications, as well as to ensure that [name] [name] is eating and defecating appropriately. Recommend continuing DVT prophylaxis post-op and further management per hospitalists team.    LOS: 1 day    Mouhamed Glassco L Veasna Santibanez 07/28/2022

## 2022-07-28 NOTE — Progress Notes (Signed)
PROGRESS NOTE     [name] [name], is a 82 y.o. male, DOB - 1941/03/03, ZOX:[id number]  Admit date - 07/25/2022   Admitting Physician Rivka Baune Mariea Clonts, MD  Outpatient Primary MD for the patient is Richardean Chimera, MD  LOS - 1  Chief Complaint  Patient presents with   Chest Pain        Brief Narrative:  82 y.o. male with a hx of CAD (s/p DES to LCx in 12/2017), PAFib, HTN, HLD, Type 2 DM, prior pericardial effusion requiring a pericardial window, history of colon cancer (s/p partial colectomy) and history of prostate cancer was admitted on 07/26/2022 with the acute elevation of LFTs with substernal and epigastric pain associated with nausea after eating spaghetti with greasy meat sauce (minced meat)  -Assessment and Plan: 1)Cholelithiasis with Acute Cholecystitis with Obstruction---patient presented with substernal and epigastric pain associated with nausea after eating spaghetti with greasy meat sauce (minced meat) -Right upper quadrant ultrasound and MRCP noted with cholelithiasis and possible stone in the cystic duct but no biliary dilatation -Imaging and clinical findings consistent with acute cholecystitis noted -Clinically No evidence for ascending cholangitis at this time -General surgery consult appreciated -Last dose of Eliquis was p.m. of 07/25/2022 (patient was off Eliquis for 3 days from 07/22/22 through 07/24/2022 to allow for EGD and colonoscopy 07/28/22 -Sleepy postop -Okay to stop Rocephin for/Flagyl -c/n As needed antiemetics -c/n As needed opiates -General surgery recommends holding Eliquis until Monday, 07/31/2022  2)PAF -stable -Severely dilated left atrium  -Mild aortic valve stenosis noted, no mitral stenosis -continue Cardizem and metoprolol for rate control -Hold Eliquis as above #1  3)DM2-  -Prior A1c 6.0 reflecting excellent diabetic control PTA  -hold metformin Use Novolog/Humalog Sliding scale insulin with Accu-Cheks/Fingersticks as ordered   4)Atypical  chest Pain/H/o CAD with Prior Stents--- -Patient was ruled out for ACS by cardiac enzymes and EKG -On further evaluation patient actually had substernal/epigastric pain radiating to his back associated with nausea after eating a greasy meal---workup consistent with cholelithiasis with acute cholecystitis -Cardiology consult and input appreciated -Echo with EF of 30 to 35%, with grade 1 diastolic dysfunction and severe LVH, no wall motion abnormalities Hold Lipitor due to elevated LFTs  5)Elevated LFTs---  AST is 888 and ALF is 585, T. bili is 1.5 , alk phos 149  -LFTs has been previously checked 4 times in the last 6 months and was Normal each time until Now -Suspect this is related to gallstones as above #1 -Hold statin as above -LFTs are trending down  6)HTN -Continue Aldactone, benazepril, Cardizem, metoprolol, hydralazine  7) chronic anemia and thrombocytopenia--- Hgb and platelets close to baseline at this time- --no acute bleeding concerns -Eliquis currently on hold due to potential for lap chole  Status is: Inpatient   Disposition: The patient is from: Home              Anticipated d/c is to: Home              Anticipated d/c date is: 2 days              Patient currently is not medically stable to d/c. Barriers: Not Clinically Stable-   Code Status :  -  Code Status: Full Code   Family Communication:    Discussed with wife at bedside  DVT Prophylaxis  :   - SCDs   SCDs Start: 07/25/22 2302  Lab Results  Component Value Date   PLT 131 (L) 07/28/2022  Inpatient Medications  Scheduled Meds:  acetaminophen  1,000 mg Oral Q6H   benazepril  40 mg Oral Daily   diltiazem  240 mg Oral Daily   dorzolamide  1 drop Both Eyes BID   hydrALAZINE  100 mg Oral TID   insulin aspart  0-15 Units Subcutaneous TID WC   iron polysaccharides  150 mg Oral BID   latanoprost  1 drop Both Eyes QHS   metoprolol succinate  25 mg Oral Daily   mupirocin ointment  1 Application Nasal BID    pantoprazole  40 mg Oral Daily   spironolactone  12.5 mg Oral Daily   Continuous Infusions:   PRN Meds:.fentaNYL (SUBLIMAZE) injection, morphine injection, nitroGLYCERIN, ondansetron (ZOFRAN) IV, oxyCODONE  Anti-infectives (From admission, onward)    Start     Dose/Rate Route Frequency Ordered Stop   07/26/22 1845  metroNIDAZOLE (FLAGYL) IVPB 500 mg  Status:  Discontinued        500 mg 100 mL/hr over 60 Minutes Intravenous Every 12 hours 07/26/22 1751 07/28/22 1340   07/26/22 1500  cefTRIAXone (ROCEPHIN) 2 g in sodium chloride 0.9 % 100 mL IVPB  Status:  Discontinued        2 g 200 mL/hr over 30 Minutes Intravenous Every 24 hours 07/26/22 1417 07/28/22 1340      Subjective: [name] [name] today has no fevers,    No chest pain,    Resting/sleepy after surgery  Objective: Vitals:   07/28/22 1315 07/28/22 1416 07/28/22 1630 07/28/22 1912  BP: (!) 184/60 (!) 163/66 (!) 141/57 (!) 130/54  Pulse: (!) 58 (!) 56 61 66  Resp: 11 18 20 20   Temp: 97.9 F (36.6 C) 97.7 F (36.5 C) 98.6 F (37 C) 98.4 F (36.9 C)  TempSrc:  Oral Oral Oral  SpO2: 93% 93% 93% 92%  Weight:      Height:        Intake/Output Summary (Last 24 hours) at 07/28/2022 1954 Last data filed at 07/28/2022 1500 Gross per 24 hour  Intake 1248 ml  Output 20 ml  Net 1228 ml   Filed Weights   07/25/22 2245 07/26/22 0000  Weight: 93.3 kg 93.3 kg   Physical Exam  Gen:-Sleepy, after surgery ,in no apparent distress  HEENT:- [place].AT, No significant sclera icterus Ears--HOH Neck-Supple Neck,No JVD,.  Lungs-  CTAB , fair symmetrical air movement CV- S1, S2 normal, regular  Abd-  +ve B.Sounds, Abd Soft, appropriate postop tenderness  extremity/Skin:- No  edema, pedal pulses present  Psych-affect is flat, oriented x3 Neuro-no new focal deficits, no tremors  Data Reviewed: I have personally reviewed following labs and imaging studies  CBC: Recent Labs  Lab 07/25/22 2031 07/26/22 0411 07/27/22 0744  07/28/22 0519  WBC 8.2 4.2 4.7 4.4  NEUTROABS 6.6  --   --   --   HGB 12.1* 11.4* 11.8* 11.2*  HCT 36.6* 35.0* 36.1* 34.1*  MCV 92.0 92.8 93.0 92.4  PLT 147* 134* 133* 131*   Basic Metabolic Panel: Recent Labs  Lab 07/25/22 2031 07/26/22 0411 07/27/22 0744 07/28/22 0519  NA 134* 135 137 137  K 4.1 4.0 3.8 3.7  CL 105 106 107 108  CO2 22 23 22 22   GLUCOSE 193* 165* 127* 143*  BUN 24* 21 12 11   CREATININE 1.23 0.88 0.73 0.74  CALCIUM 9.3 9.0 8.8* 8.9  MG  --  2.4  --   --    GFR: Estimated Creatinine Clearance: 84.5 mL/min (by C-G formula based on  SCr of 0.74 mg/dL). Liver Function Tests: Recent Labs  Lab 07/26/22 0411 07/27/22 0744 07/28/22 0519  AST 888* 289* 127*  ALT 585* 435* 281*  ALKPHOS 149* 164* 140*  BILITOT 1.5* 1.0 0.8  PROT 6.1* 6.0* 5.8*  ALBUMIN 3.5 3.5 3.2*   Radiology Studies: No results found.   Scheduled Meds:  acetaminophen  1,000 mg Oral Q6H   benazepril  40 mg Oral Daily   diltiazem  240 mg Oral Daily   dorzolamide  1 drop Both Eyes BID   hydrALAZINE  100 mg Oral TID   insulin aspart  0-15 Units Subcutaneous TID WC   iron polysaccharides  150 mg Oral BID   latanoprost  1 drop Both Eyes QHS   metoprolol succinate  25 mg Oral Daily   mupirocin ointment  1 Application Nasal BID   pantoprazole  40 mg Oral Daily   spironolactone  12.5 mg Oral Daily   Continuous Infusions:    LOS: 1 day   Shon Hale M.D on 07/28/2022 at 7:54 PM  Go to [email or web address] - for contact info  [facility] - Office  [phone]  If 7PM-7AM, please contact night-coverage [email or web address] 07/28/2022, 7:54 PM

## 2022-07-28 NOTE — Progress Notes (Signed)
[facility]  Spoke with the patient's wife in the consultation room.  I explained that he tolerated the procedure without difficulty.  He has dissolvable stitches under the skin with overlying skin glue.  This will flake off in 10 to 14 days.  He will be transferred back to his room on the floor.  I will give him a regular diet.  Will plan to repeat blood work tomorrow.  If he is doing well tomorrow, will likely plan for discharge home at that time.  Will need to hold Eliquis until Monday.  All questions were answered to her expressed satisfaction.  Plan: -Transfer back to floor -Regular diet -PRN pain control and anti-emetics -Repeat blood work in AM -Hopeful for discharge home tomorrow pending patient progression -Continue to hold Eliquis until Monday  Theophilus Kinds, DO [facility] [address] [place], [place] [address] [phone] (office)

## 2022-07-28 NOTE — Anesthesia Preprocedure Evaluation (Addendum)
Anesthesia Evaluation  Patient identified by MRN, date of birth, ID band Patient awake    Reviewed: Allergy & Precautions, H&P , NPO status , Patient's Chart, lab work & pertinent test results, reviewed documented beta blocker date and time   Airway Mallampati: II  TM Distance: >3 FB Neck ROM: full    Dental no notable dental hx.    Pulmonary former smoker   Pulmonary exam normal breath sounds clear to auscultation       Cardiovascular Exercise Tolerance: Good hypertension, + CAD and + Past MI  + dysrhythmias Atrial Fibrillation  Rhythm:irregular Rate:Normal     Neuro/Psych  Headaches negative neurological ROS  negative psych ROS   GI/Hepatic negative GI ROS, Neg liver ROS,GERD  ,,  Endo/Other  diabetes, Type 2    Renal/GU Renal diseasenegative Renal ROS  negative genitourinary   Musculoskeletal   Abdominal   Peds  Hematology negative hematology ROS (+) Blood dyscrasia, anemia   Anesthesia Other Findings   Reproductive/Obstetrics negative OB ROS                             Anesthesia Physical Anesthesia Plan  ASA: 3  Anesthesia Plan: General   Post-op Pain Management:    Induction:   PONV Risk Score and Plan: Ondansetron and Scopolamine patch - Pre-op  Airway Management Planned: Oral ETT  Additional Equipment:   Intra-op Plan:   Post-operative Plan:   Informed Consent: I have reviewed the patients History and Physical, chart, labs and discussed the procedure including the risks, benefits and alternatives for the proposed anesthesia with the patient or authorized representative who has indicated his/her understanding and acceptance.     Dental Advisory Given  Plan Discussed with: CRNA  Anesthesia Plan Comments:         Anesthesia Quick Evaluation

## 2022-07-28 NOTE — Op Note (Signed)
[facility] Operative Note  07/28/22  Preoperative Diagnosis: Acute cholecystitis   Postoperative Diagnosis: Same   Procedure(s) Performed: Robotic Assisted Laparoscopic Cholecystectomy   Surgeon: Theophilus Kinds, DO   Assistants: No qualified resident was available    Anesthesia: General endotracheal   Anesthesiologist: Dr. Johnnette Litter   Specimens: Gallbladder   Estimated Blood Loss: Minimal   Blood Replacement: None    Complications: None   Wound Class: Contaminated   Operative Indications: Patient was admitted to the hospital with atypical chest pain.  He had elevation in his LFTs with prompted abdominal ultrasound which demonstrated gallbladder distention and wall thickening.  He subsequently underwent MRCP which demonstrated stones, wall thickening, and gallbladder distention concerning for acute cholecystitis.  Decision was made to take the patient to the operating room for cholecystectomy.  We discussed the risk of the procedure including but not limited to bleeding, infection, injury to the common bile duct, bile leak, need for further procedures, chance of subtotal cholecystectomy.   Findings:  Inflamed gallbladder Critical view of safety noted All clips intact at the end of the case Adequate hemostasis   Procedure: Firefly was given in the preoperative area. The patient was taken to the operating room and placed supine. General endotracheal anesthesia was induced.  Patient is receiving IV antibiotics on the floor.  An orogastric tube positioned to decompress the stomach. The abdomen was prepared and draped in the usual sterile fashion. A time-out was completed verifying correct patient, procedure, site, positioning, and implant(s) and/or special equipment prior to beginning this procedure.  Veress needle was placed at the infraumbilical area and insufflation was started after confirming a positive saline drop test and no immediate increase in  abdominal pressure.  After reaching 15 mm, the Veress needle was removed and a 8 mm port was placed via optiview technique infraumbilical, measuring 20 mm away from the suspected position of the gallbladder.  The abdomen was inspected and no abnormalities or injuries were found.  Under direct vision, ports were placed in the following locations in a semi curvilinear position around the target of the gallbladder: Two 8 mm ports on the patient's right each having 8cm clearance to the adjacent ports and one 8 mm port placed on the patient's left 8 cm from the umbilical port. Once ports were placed, the table was placed in the reverse Trendelenburg position with the right side up. The Xi platform was brought into the operative field and docked to the ports successfully.  An endoscope was placed through the umbilical port, prograsp through the most lateral right port, fenestrated bipolar to the port just right of the umbilicus, and then a hook cautery in the left port.  The dome of the gallbladder was grasped with prograsp and retracted over the dome of the liver. Adhesions between the gallbladder and omentum, duodenum and transverse colon were lysed via hook cautery. The infundibulum was grasped with the fenestrated grasper and retracted toward the right lower quadrant. This maneuver exposed Calot's triangle. Firefly was used throughout the dissection to ensure safe visualization of the cystic duct. The peritoneum overlying the gallbladder infundibulum was then dissected and the cystic duct and cystic artery identified.  Critical view of safety with the liver bed clearly visible behind the duct and artery with no additional structures noted.  The cystic duct and cystic artery were doubly clipped and divided close to the gallbladder.    The gallbladder was then dissected from its peritoneal and liver bed attachments by electrocautery. Hemostasis was  checked prior to removing the hook cautery.  The [employer] was undocked and  moved out of the field.  A 5mm Endo Catch bag was then placed through the umbilical port and the gallbladder was removed.  The gallbladder was passed off the table as a specimen. There was no evidence of bleeding from the gallbladder fossa or cystic artery or leakage of the bile from the cystic duct stump. The umbilical port site closed with a 0 vicryl with a PMI needle.  The abdomen was desufflated and secondary trocars were removed under direct vision. No bleeding was noted. All skin incisions were closed with subcuticular sutures of 4-0 monocryl and dermabond.   Final inspection revealed acceptable hemostasis. All counts were correct at the end of the case. The patient was awakened from anesthesia and extubated without complication. The OG tube was removed.  The patient went to the PACU in stable condition.   Theophilus Kinds, DO [facility] [address] [place], [place] [address] [phone] (office)

## 2022-07-28 NOTE — Care Management Important Message (Signed)
Important Message  Patient Details  Name: COLDEN CZERNIAK MRN: [id number] Date of Birth: Nov 30, 1940   Medicare Important Message Given:  Yes (copy left in room)     Corey Harold 07/28/2022, 12:15 PM

## 2022-07-28 NOTE — Plan of Care (Signed)
  Problem: Education: Goal: Knowledge of General Education information will improve Description: Including pain rating scale, medication(s)/side effects and non-pharmacologic comfort measures Outcome: Progressing   Problem: Health Behavior/Discharge Planning: Goal: Ability to manage health-related needs will improve Outcome: Progressing   Problem: Clinical Measurements: Goal: Ability to maintain clinical measurements within normal limits will improve Outcome: Progressing Goal: Will remain free from infection Outcome: Progressing Goal: Diagnostic test results will improve Outcome: Progressing Goal: Respiratory complications will improve Outcome: Progressing Goal: Cardiovascular complication will be avoided Outcome: Progressing   Problem: Activity: Goal: Risk for activity intolerance will decrease Outcome: Progressing   Problem: Nutrition: Goal: Adequate nutrition will be maintained Outcome: Progressing   Problem: Coping: Goal: Level of anxiety will decrease Outcome: Progressing   Problem: Elimination: Goal: Will not experience complications related to bowel motility Outcome: Progressing Goal: Will not experience complications related to urinary retention Outcome: Progressing   Problem: Pain Managment: Goal: General experience of comfort will improve Outcome: Progressing   Problem: Safety: Goal: Ability to remain free from injury will improve Outcome: Progressing   Problem: Skin Integrity: Goal: Risk for impaired skin integrity will decrease Outcome: Progressing   Problem: Education: Goal: Understanding of cardiac disease, CV risk reduction, and recovery process will improve Outcome: Progressing Goal: Individualized Educational Video(s) Outcome: Progressing   Problem: Activity: Goal: Ability to tolerate increased activity will improve Outcome: Progressing   Problem: Cardiac: Goal: Ability to achieve and maintain adequate cardiovascular perfusion will  improve Outcome: Progressing   Problem: Health Behavior/Discharge Planning: Goal: Ability to safely manage health-related needs after discharge will improve Outcome: Progressing   Problem: Education: Goal: Ability to describe self-care measures that may prevent or decrease complications (Diabetes Survival Skills Education) will improve Outcome: Progressing Goal: Individualized Educational Video(s) Outcome: Progressing   Problem: Coping: Goal: Ability to adjust to condition or change in health will improve Outcome: Progressing   Problem: Fluid Volume: Goal: Ability to maintain a balanced intake and output will improve Outcome: Progressing   Problem: Health Behavior/Discharge Planning: Goal: Ability to identify and utilize available resources and services will improve Outcome: Progressing Goal: Ability to manage health-related needs will improve Outcome: Progressing   Problem: Metabolic: Goal: Ability to maintain appropriate glucose levels will improve Outcome: Progressing   Problem: Nutritional: Goal: Maintenance of adequate nutrition will improve Outcome: Progressing Goal: Progress toward achieving an optimal weight will improve Outcome: Progressing   Problem: Skin Integrity: Goal: Risk for impaired skin integrity will decrease Outcome: Progressing   Problem: Tissue Perfusion: Goal: Adequacy of tissue perfusion will improve Outcome: Progressing   

## 2022-07-29 DIAGNOSIS — K8001 Calculus of gallbladder with acute cholecystitis with obstruction: Secondary | ICD-10-CM | POA: Diagnosis not present

## 2022-07-29 LAB — CBC
HCT: 35.1 % — ABNORMAL LOW (ref 39.0–52.0)
Hemoglobin: 11.5 g/dL — ABNORMAL LOW (ref 13.0–17.0)
MCH: 30.5 pg (ref 26.0–34.0)
MCHC: 32.8 g/dL (ref 30.0–36.0)
MCV: 93.1 fL (ref 80.0–100.0)
Platelets: 140 10*3/uL — ABNORMAL LOW (ref 150–400)
RBC: 3.77 MIL/uL — ABNORMAL LOW (ref 4.22–5.81)
RDW: 14.8 % (ref 11.5–15.5)
WBC: 9.8 10*3/uL (ref 4.0–10.5)
nRBC: 0 % (ref 0.0–0.2)

## 2022-07-29 LAB — COMPREHENSIVE METABOLIC PANEL
ALT: 223 U/L — ABNORMAL HIGH (ref 0–44)
AST: 78 U/L — ABNORMAL HIGH (ref 15–41)
Albumin: 3.4 g/dL — ABNORMAL LOW (ref 3.5–5.0)
Alkaline Phosphatase: 132 U/L — ABNORMAL HIGH (ref 38–126)
Anion gap: 6 (ref 5–15)
BUN: 18 mg/dL (ref 8–23)
CO2: 24 mmol/L (ref 22–32)
Calcium: 9.1 mg/dL (ref 8.9–10.3)
Chloride: 104 mmol/L (ref 98–111)
Creatinine, Ser: 0.91 mg/dL (ref 0.61–1.24)
GFR, Estimated: >60 mL/min (ref >60)
Glucose, Bld: 215 mg/dL — ABNORMAL HIGH (ref 70–99)
Potassium: 4.4 mmol/L (ref 3.5–5.1)
Sodium: 134 mmol/L — ABNORMAL LOW (ref 135–145)
Total Bilirubin: 0.6 mg/dL (ref 0.3–1.2)
Total Protein: 6 g/dL — ABNORMAL LOW (ref 6.5–8.1)

## 2022-07-29 LAB — GLUCOSE, CAPILLARY
Glucose-Capillary: 166 mg/dL — ABNORMAL HIGH (ref 70–99)
Glucose-Capillary: 222 mg/dL — ABNORMAL HIGH (ref 70–99)

## 2022-07-29 MED ORDER — DOCUSATE SODIUM 100 MG PO CAPS: 100.0000 mg | ORAL_CAPSULE | Freq: Two times a day (BID) | ORAL | 2 refills | Status: DC

## 2022-07-29 MED ORDER — SPIRONOLACTONE 25 MG PO TABS: 12.5000 mg | ORAL_TABLET | Freq: Every day | ORAL | 6 refills | Status: DC

## 2022-07-29 MED ORDER — ATORVASTATIN CALCIUM 80 MG PO TABS: 80.0000 mg | ORAL_TABLET | Freq: Every day | ORAL | 3 refills | Status: DC

## 2022-07-29 MED ORDER — OXYCODONE HCL 5 MG PO TABS: 5.0000 mg | ORAL_TABLET | Freq: Four times a day (QID) | ORAL | 0 refills | Status: DC | PRN

## 2022-07-29 MED ORDER — PANTOPRAZOLE SODIUM 40 MG PO TBEC: 40.0000 mg | DELAYED_RELEASE_TABLET | Freq: Every day | ORAL | 1 refills | Status: AC

## 2022-07-29 MED ORDER — APIXABAN 5 MG PO TABS: 5.0000 mg | ORAL_TABLET | Freq: Two times a day (BID) | ORAL | 6 refills | Status: AC

## 2022-07-29 MED ORDER — ONDANSETRON HCL 4 MG PO TABS: 4.0000 mg | ORAL_TABLET | Freq: Every day | ORAL | 1 refills | Status: DC | PRN

## 2022-07-29 MED ORDER — METOPROLOL SUCCINATE ER 25 MG PO TB24: 25.0000 mg | ORAL_TABLET | Freq: Every day | ORAL | 3 refills | Status: AC

## 2022-07-29 MED ORDER — HYDRALAZINE HCL 100 MG PO TABS: 100.0000 mg | ORAL_TABLET | Freq: Three times a day (TID) | ORAL | 3 refills | Status: DC

## 2022-07-29 MED ORDER — BENAZEPRIL HCL 40 MG PO TABS: 40.0000 mg | ORAL_TABLET | Freq: Every day | ORAL | 3 refills | Status: DC

## 2022-07-29 MED ORDER — METFORMIN HCL 500 MG PO TABS: 500.0000 mg | ORAL_TABLET | Freq: Two times a day (BID) | ORAL | 4 refills | Status: DC

## 2022-07-29 MED ORDER — FERREX 150 150 MG PO CAPS: 150.0000 mg | ORAL_CAPSULE | Freq: Two times a day (BID) | ORAL | 5 refills | Status: DC

## 2022-07-29 MED ORDER — ACETAMINOPHEN 500 MG PO TABS: 1000.0000 mg | ORAL_TABLET | Freq: Four times a day (QID) | ORAL | 0 refills | Status: AC

## 2022-07-29 NOTE — Discharge Summary (Signed)
[name] [name], is a 82 y.o. male  DOB 16-Jul-1940  MRN [id number].  Admission date:  07/25/2022  Admitting Physician  Shon Hale, MD  Discharge Date:  07/29/2022   Primary MD  Richardean Chimera, MD  Recommendations for primary care physician for things to follow:   1)Please Hold Eliquis/apixaban until Monday, 07/31/2022 2)Please Hold Atorvastatin/Lipitor until Monday, 08/07/2022 3)Avoid ibuprofen/Advil/Aleve/Motrin/Goody Powders/Naproxen/BC powders/Meloxicam/Diclofenac/Indomethacin and other Nonsteroidal anti-inflammatory medications as these will make you more likely to bleed and can cause stomach ulcers, can also cause Kidney problems.  4)Repeat CBC and CMP Blood Tests in 1 week with Richardean Chimera, MD (primary care provider) 5)Avoid Lifting more than 20 pounds for the next 6 weeks 6)Follow-up with general surgeon Dr. Michelle Nasuti in 2 weeks as advised  Admission Diagnosis  Unstable angina (HCC) [I20.0] Chest pain [R07.9] Cholecystitis, acute with cholelithiasis [K80.00]   Discharge Diagnosis  Unstable angina (HCC) [I20.0] Chest pain [R07.9] Cholecystitis, acute with cholelithiasis [K80.00]    Principal Problem:   Cholelithiasis and acute cholecystitis with obstruction Active Problems:   PAF (paroxysmal atrial fibrillation) (HCC)   Abnormal LFTs--Gallstone Related   Type 2 diabetes mellitus with diabetic chronic kidney disease (HCC)   Mixed hyperlipidemia   CAD (coronary artery disease)   Essential hypertension   Thrombocytopenia (HCC)   Chest pain   AKI (acute kidney injury) (HCC)   Cholecystitis, acute with cholelithiasis      Past Medical History:  Diagnosis Date   Anemia    Arthritis    CAD (coronary artery disease)    DES to circumflex 12/2017   Colon cancer (HCC) 2023   Diabetes mellitus without complication (HCC)    Dysrhythmia    GERD (gastroesophageal reflux disease)     Glaucoma    Gout    Headache    History of kidney stones    Hypertension    NSTEMI (non-ST elevated myocardial infarction) (HCC) 12/31/2017   Paroxysmal atrial fibrillation (HCC)    Pericardial effusion    Idiopathic, recurrent pericardial effusion s/p pericardial window.   Prostate cancer (HCC) 2007   S/P Seed implants    Supraventricular tachycardia    Temporal arteritis [facility])     Past Surgical History:  Procedure Laterality Date   BIOPSY  10/12/2020   Procedure: BIOPSY;  Surgeon: Dolores Frame, MD;  [facility];  Service: Gastroenterology;;   BIOPSY  02/25/2021   Procedure: BIOPSY;  Surgeon: Dolores Frame, MD;  [facility];  Service: Gastroenterology;;   BIOPSY  04/11/2021   Procedure: BIOPSY;  Surgeon: Lemar Lofty., MD;  Location: [facility];  Service: Gastroenterology;;   CATARACT EXTRACTION W/PHACO Left 06/07/2015   Procedure: CATARACT EXTRACTION PHACO AND INTRAOCULAR LENS PLACEMENT LEFT EYE CDE=8.00;  Surgeon: Gemma Payor, MD;  Location: [facility];  Service: Ophthalmology;  Laterality: Left;   CATARACT EXTRACTION W/PHACO Right 06/17/2015   Procedure: CATARACT EXTRACTION PHACO AND INTRAOCULAR LENS PLACEMENT RIGHT EYE CDE=11.09;  Surgeon: Gemma Payor, MD;  Location: [facility];  Service: Ophthalmology;  Laterality: Right;   COLONOSCOPY WITH PROPOFOL N/A 10/12/2020   Procedure: COLONOSCOPY WITH PROPOFOL;  Surgeon: Dolores Frame, MD;  [facility];  Service: Gastroenterology;  Laterality: N/A;  10:35   COLONOSCOPY WITH PROPOFOL N/A 02/25/2021   Procedure: COLONOSCOPY WITH PROPOFOL;  Surgeon: Dolores Frame, MD;  [facility];  Service: Gastroenterology;  Laterality: N/A;  8:10   COLONOSCOPY WITH PROPOFOL N/A 07/24/2022   Procedure: COLONOSCOPY WITH PROPOFOL;  Surgeon: Meridee Score Netty Starring., MD;  Location: [facility];  Service: Gastroenterology;  Laterality: N/A;   CORONARY ANGIOPLASTY  WITH STENT PLACEMENT  01/01/2018   CORONARY STENT INTERVENTION N/A 01/01/2018   Procedure: CORONARY STENT INTERVENTION;  Surgeon: Corky Crafts, MD;  [facility];  Service: Cardiovascular;  Laterality: N/A;   ENDOSCOPIC MUCOSAL RESECTION N/A 04/11/2021   Procedure: ENDOSCOPIC MUCOSAL RESECTION;  Surgeon: Meridee Score Netty Starring., MD;  Location: [facility];  Service: Gastroenterology;  Laterality: N/A;   ENTEROSCOPY N/A 10/12/2020   Procedure: PUSH ENTEROSCOPY;  Surgeon: Dolores Frame, MD;  [facility];  Service: Gastroenterology;  Laterality: N/A;   ENTEROSCOPY N/A 04/11/2021   Procedure: ENTEROSCOPY;  Surgeon: Meridee Score Netty Starring., MD;  Location: [facility];  Service: Gastroenterology;  Laterality: N/A;   ENTEROSCOPY N/A 07/24/2022   Procedure: ENTEROSCOPY;  Surgeon: Meridee Score Netty Starring., MD;  Location: [facility];  Service: Gastroenterology;  Laterality: N/A;   ESOPHAGOGASTRODUODENOSCOPY (EGD) WITH PROPOFOL N/A 10/12/2020   Procedure: ESOPHAGOGASTRODUODENOSCOPY (EGD) WITH PROPOFOL;  Surgeon: Dolores Frame, MD;  [facility];  Service: Gastroenterology;  Laterality: N/A;   FRACTURE SURGERY Left 2010   ankle   HEMOSTASIS CLIP PLACEMENT  04/11/2021   Procedure: HEMOSTASIS CLIP PLACEMENT;  Surgeon: Lemar Lofty., MD;  [facility]: [facility];  Service: Gastroenterology;;   INSERTION PROSTATE RADIATION SEED  2007   KNEE ARTHROSCOPY Left    LAPAROSCOPY N/A 05/22/2021   Procedure: LAPAROSCOPY DIAGNOSTIC WITH WASHOUT OF ABDOMEN;  Surgeon: Romie Levee, MD;  Location: [facility];  Service: General;  Laterality: N/A;   LEFT HEART CATH AND CORONARY ANGIOGRAPHY N/A 01/01/2018   Procedure: LEFT HEART CATH AND CORONARY ANGIOGRAPHY;  Surgeon: Corky Crafts, MD;  [facility];  Service: Cardiovascular;  Laterality: N/A;   ORIF TIBIA & FIBULA FRACTURES Left 2003   Distal tibial/fibula    PERICARDIAL  WINDOW  02/2007   PERICARDIOCENTESIS  2007   hx/notes 10/19/2011   POLYPECTOMY  10/12/2020   Procedure: POLYPECTOMY;  Surgeon: Dolores Frame, MD;  [facility];  Service: Gastroenterology;;  small bowel, cecal   POLYPECTOMY  02/25/2021   Procedure: POLYPECTOMY;  Surgeon: Dolores Frame, MD;  [facility];  Service: Gastroenterology;;  transverse colon x2   POLYPECTOMY  07/24/2022   Procedure: POLYPECTOMY;  Surgeon: Mansouraty, Netty Starring., MD;  Location: [facility];  Service: Gastroenterology;;   Brooke Dare INJECTION  04/11/2021   Procedure: SUBMUCOSAL LIFTING INJECTION;  Surgeon: Lemar Lofty., MD;  Location: [facility];  Service: Gastroenterology;;   SUBMUCOSAL TATTOO INJECTION  04/11/2021   Procedure: SUBMUCOSAL TATTOO INJECTION;  Surgeon: Lemar Lofty., MD;  Location: [facility];  Service: Gastroenterology;;     HPI  from the history and physical done on the day of admission:  HPI: DECLEN HOMRICH is a 82 y.o. male with medical history significant of anemia, coronary artery disease, diabetes mellitus type 2, GERD, hypertension, NSTEMI, paroxysmal atrial fibrillation, and more presents the ED with a  chief complaint of severe chest pain.  This started around 430 or 5 PM on April 30.  He reports he was sitting on the sofa when it started.  It felt like a pressure, burning, aching in this center of his chest.  He was not short of breath did not have palpitations.  He did have diaphoresis and nausea.  His pain radiated through to his back.  He drank some Sprite and took some Mylanta thinking it could be gas.  The pain was unchanged with exertion, and unchanged with this attempted treatment with Sprite.  Patient reports the pain gradually went away on its own after being in the ER for about an hour.  They given chewable aspirin in the ER he reports it had no effect on the pain.  He did not give him nitro.  He reports he given  morphine but the pain was almost gone anyways by the time he got the morphine.  He denies any fever.  He did have a colonoscopy and an EGD the day before.  The day of he felt fine before the pain came on and walked 2 miles.  It is normal for him to walk about 2 miles per day.  Patient has no other complaints at this time.   Patient does not smoke, does not drink.  He is vaccinated for COVID.  Patient is full code. Review of Systems: As mentioned in the history of present illness. All other systems reviewed and are negative.     Hospital Course:   Brief Narrative:  82 y.o. male with a hx of CAD (s/p DES to LCx in 12/2017), PAFib, HTN, HLD, Type 2 DM, prior pericardial effusion requiring a pericardial window, history of colon cancer (s/p partial colectomy) and history of prostate cancer was admitted on 07/26/2022 with the acute elevation of LFTs with substernal and epigastric pain associated with nausea after eating spaghetti with greasy meat sauce (minced meat)   -Assessment and Plan: 1)Cholelithiasis with Acute Cholecystitis with Obstruction---patient presented with substernal and epigastric pain associated with nausea after eating spaghetti with greasy meat sauce (minced meat) -Right upper quadrant ultrasound and MRCP noted with cholelithiasis and possible stone in the cystic duct but no biliary dilatation -Imaging and clinical findings consistent with acute cholecystitis noted -Clinically No evidence for ascending cholangitis at this time -General surgery consult appreciated -Last dose of Eliquis was p.m. of 07/25/2022 (patient was off Eliquis for 3 days from 07/22/22 through 07/24/2022 to allow for EGD and colonoscopy -Discontinued Rocephin /Flagyl -c/n As needed antiemetics -c/n As needed opiates -General surgery recommends holding Eliquis until Monday, 07/31/2022   2)PAF -stable -Severely dilated left atrium  -Mild aortic valve stenosis noted, no mitral stenosis -continue Cardizem and  metoprolol for rate control -Hold Eliquis until 07/31/2022 as above #1   3)DM2-  -Prior A1c 6.0 reflecting excellent diabetic control PTA  Resume metformin   4)Atypical chest Pain/H/o CAD with Prior Stents--- -Patient was ruled out for ACS by cardiac enzymes and EKG -On further evaluation patient actually had substernal/epigastric pain radiating to his back associated with nausea after eating a greasy meal---workup consistent with cholelithiasis with acute cholecystitis -Cardiology consult and input appreciated -Echo with EF of 30 to 35%, with grade 1 diastolic dysfunction and severe LVH, no wall motion abnormalities Hold Lipitor due to elevated LFTs until 08/07/2022 after repeat LFTs   5)Elevated LFTs---  AST is 888 and ALF is 585, T. bili is 1.5 , alk phos 149  -LFTs has been  previously checked 4 times in the last 6 months and was Normal each time until Now -Suspect this is related to gallstones as above #1 -Hold statin as above -LFTs are trending down    Latest Ref Rng & Units 07/29/2022    4:42 AM 07/28/2022    5:19 AM 07/27/2022    7:44 AM  Hepatic Function  Total Protein 6.5 - 8.1 g/dL 6.0  5.8  6.0   Albumin 3.5 - 5.0 g/dL 3.4  3.2  3.5   AST 15 - 41 U/L 78  127  289   ALT 0 - 44 U/L 223  281  435   Alk Phosphatase 38 - 126 U/L 132  140  164   Total Bilirubin 0.3 - 1.2 mg/dL 0.6  0.8  1.0      6)HTN -Continue Aldactone, benazepril, Cardizem, metoprolol, hydralazine   7) chronic anemia and thrombocytopenia--- Hgb and platelets close to baseline at this time- --no acute bleeding concerns -Eliquis currently on hold due to potential for lap chole  Disposition: The patient is from: Home              Anticipated d/c is to: Home  Discharge Condition: stable  Follow UP   Follow-up Information     Pappayliou, Catherine A, DO. Call.   Specialty: General Surgery Why: Call to schedule follow-up appointment in 2 weeks Contact information: [address] [facility]  [address] [phone]         Richardean Chimera, MD. Schedule an appointment as soon as possible for a visit in 1 week(s).   Specialty: Family Medicine Why: Repeat CBC and CMP Blood tests Contact information: [address] [place] [address] [phone]                 Consults obtained - Gen surgery  Diet and Activity recommendation:  As advised  Discharge Instructions    Discharge Instructions     Call MD for:  difficulty breathing, headache or visual disturbances   Complete by: As directed    Call MD for:  persistant dizziness or light-headedness   Complete by: As directed    Call MD for:  persistant nausea and vomiting   Complete by: As directed    Call MD for:  severe uncontrolled pain   Complete by: As directed    Call MD for:  temperature >100.4   Complete by: As directed    Diet - low sodium heart healthy   Complete by: As directed    Discharge instructions   Complete by: As directed    1)Please Hold Eliquis/apixaban until Monday, 07/31/2022 2)Please Hold Atorvastatin/Lipitor until Monday, 08/07/2022 3)Avoid ibuprofen/Advil/Aleve/Motrin/Goody Powders/Naproxen/BC powders/Meloxicam/Diclofenac/Indomethacin and other Nonsteroidal anti-inflammatory medications as these will make you more likely to bleed and can cause stomach ulcers, can also cause Kidney problems.  4)Repeat CBC and CMP Blood Tests in 1 week with Richardean Chimera, MD (primary care provider) 5)Avoid Lifting more than 20 pounds for the next 6 weeks 6)Follow-up with general surgeon Dr. Michelle Nasuti in 2 weeks as advised   Increase activity slowly   Complete by: As directed        Discharge Medications    Allergies as of 07/29/2022       Reactions   Amiodarone Rash        Medication List     STOP taking these medications    polyethylene glycol-electrolytes 420 g solution Commonly known as: NuLYTELY       TAKE these  medications    acetaminophen 500 MG tablet Commonly known as:  TYLENOL Take 2 tablets (1,000 mg total) by mouth every 6 (six) hours for 7 days. What changed:  medication strength how much to take when to take this reasons to take this   allopurinol 300 MG tablet Commonly known as: ZYLOPRIM Take 450 mg by mouth in the morning.   aluminum-magnesium hydroxide 200-200 MG/5ML suspension Take 5 mLs by mouth every 6 (six) hours as needed for indigestion.   apixaban 5 MG Tabs tablet Commonly known as: ELIQUIS Take 1 tablet (5 mg total) by mouth 2 (two) times daily for 2 days. Start taking on: Jul 31, 2022 What changed: These instructions start on Jul 31, 2022. If you are unsure what to do until then, ask your doctor or other care provider.   atorvastatin 80 MG tablet Commonly known as: LIPITOR Take 1 tablet (80 mg total) by mouth daily. Start taking on: Aug 07, 2022 What changed:  See the new instructions. These instructions start on Aug 07, 2022. If you are unsure what to do until then, ask your doctor or other care provider.   benazepril 40 MG tablet Commonly known as: LOTENSIN Take 1 tablet (40 mg total) by mouth daily. What changed: See the new instructions.   cyanocobalamin 1000 MCG tablet Take 1 tablet (1,000 mcg total) by mouth daily.   diltiazem 240 MG 24 hr capsule Commonly known as: CARDIZEM CD Take 240 mg by mouth daily.   docusate sodium 100 MG capsule Commonly known as: Colace Take 1 capsule (100 mg total) by mouth 2 (two) times daily.   dorzolamide 2 % ophthalmic solution Commonly known as: TRUSOPT Place 1 drop into both eyes 2 (two) times daily.   Ferrex 150 150 MG capsule Generic drug: iron polysaccharides Take 1 capsule (150 mg total) by mouth 2 (two) times daily.   hydrALAZINE 100 MG tablet Commonly known as: APRESOLINE Take 1 tablet (100 mg total) by mouth 3 (three) times daily. What changed:  how much to take how to take this when to take this additional instructions   latanoprost 0.005 % ophthalmic  solution Commonly known as: XALATAN Place 1 drop into both eyes at bedtime.   loperamide 2 MG capsule Commonly known as: IMODIUM Take 2 mg by mouth as needed for diarrhea or loose stools.   metFORMIN 500 MG tablet Commonly known as: GLUCOPHAGE Take 1 tablet (500 mg total) by mouth 2 (two) times daily.   metoprolol succinate 25 MG 24 hr tablet Commonly known as: TOPROL-XL Take 1 tablet (25 mg total) by mouth daily. Start taking on: Jul 30, 2022 What changed:  medication strength how much to take additional instructions   multivitamin tablet Take 1 tablet by mouth in the morning.   ondansetron 4 MG tablet Commonly known as: Zofran Take 1 tablet (4 mg total) by mouth daily as needed for nausea or vomiting.   OneTouch Ultra test strip Generic drug: glucose blood USE 1 STRIP TO CHECK GLUCOSE ONCE DAILY   oxyCODONE 5 MG immediate release tablet Commonly known as: Roxicodone Take 1 tablet (5 mg total) by mouth every 6 (six) hours as needed.   pantoprazole 40 MG tablet Commonly known as: PROTONIX Take 1 tablet (40 mg total) by mouth daily.   prochlorperazine 10 MG tablet Commonly known as: COMPAZINE Take 1 tablet (10 mg total) by mouth every 6 (six) hours as needed for nausea or vomiting.   spironolactone 25 MG tablet Commonly known as:  Aldactone Take 0.5 tablets (12.5 mg total) by mouth daily.   vitamin C 1000 MG tablet Take 1,000 mg by mouth 2 (two) times daily.        Major procedures and Radiology Reports - PLEASE review detailed and final reports for all details, in brief -  MR ABDOMEN MRCP W WO CONTAST  Result Date: 07/26/2022 CLINICAL DATA:  Jaundice. Gallbladder wall thickening and edema. History of colon cancer. EXAM: MRI ABDOMEN WITHOUT AND WITH CONTRAST (INCLUDING MRCP) TECHNIQUE: Multiplanar multisequence MR imaging of the abdomen was performed both before and after the administration of intravenous contrast. Heavily T2-weighted images of the biliary and  pancreatic ducts were obtained, and three-dimensional MRCP images were rendered by post processing. CONTRAST:  7mL GADAVIST GADOBUTROL 1 MMOL/ML IV SOLN COMPARISON:  None Available. FINDINGS: Lower chest: Trace pleural fluid. There is a small pericardial effusion with a more prominent component towards the left side of the heart. Heart is slightly enlarged. Hepatobiliary: The liver of several small bright T2, low T1 nonenhancing foci consistent with benign cysts. Many are under a cm and too small to completely characterize but are nonaggressive and likely benign cystic lesions or hamartomas. There is one lesion which is bright on T2 with progressive nodular enhancement in segment 4 consistent with a hemangioma. On series 25, image 53 this measures 2.4 x 1.4 cm. Second smaller hemangioma best seen on series 25, image 48 in segment 7. Patent portal vein. No intrahepatic biliary ductal dilatation. The common duct has a diameter approaching 3-4 mm. Normal tapering towards the pancreatic head The gallbladder is mildly distended. There is wall thickening as well as adjacent edema. Multiple stone seen towards the neck of the gallbladder. Question stone towards the cystic duct as well acute cholecystitis is possible. Pancreas: Global atrophy of the pancreas. No obvious mass. Slightly low T1 signal. Please correlate for any previous history of pancreatitis remotely. Simple appearing cyst once again in the body/neck of the pancreas measuring 13 mm. Spleen: Within normal limits in size and appearance. Adrenals/Urinary Tract: Adrenal glands are preserved. Kidneys are without enhancing mass. No collecting system dilatation. There is perinephric stranding. Small subcentimeter nonenhancing benign-appearing renal cysts are identified. No specific imaging follow-up. Stomach/Bowel: Stomach is nondilated. Moderate colonic stool. Small bowel is nondilated. Vascular/Lymphatic: Normal caliber aorta and IVC with some atherosclerotic  changes. No specific abnormal lymph node enlargement seen in the visualized abdomen. Other: No ascites identified in the visualized abdomen. Motion artifact. Musculoskeletal: Scattered degenerative changes. IMPRESSION: Distended gallbladder with wall edema, thickening and adjacent fluid. Several stones identified towards the neck region and there is a possible stone towards the cystic duct. Please correlate for acute cholecystitis. No biliary ductal dilatation. Multiple hepatic and renal benign-appearing cysts. There are 2 lesions in the liver elsewhere consistent with hemangiomas. Benign-appearing 13 mm pancreatic cyst. Recommend follow up imaging in 2 years. Motion artifact. Electronically Signed   By: Karen Kays M.D.   On: 07/26/2022 13:44   MR 3D Recon At Scanner  Result Date: 07/26/2022 CLINICAL DATA:  Jaundice. Gallbladder wall thickening and edema. History of colon cancer. EXAM: MRI ABDOMEN WITHOUT AND WITH CONTRAST (INCLUDING MRCP) TECHNIQUE: Multiplanar multisequence MR imaging of the abdomen was performed both before and after the administration of intravenous contrast. Heavily T2-weighted images of the biliary and pancreatic ducts were obtained, and three-dimensional MRCP images were rendered by post processing. CONTRAST:  7mL GADAVIST GADOBUTROL 1 MMOL/ML IV SOLN COMPARISON:  None Available. FINDINGS: Lower chest: Trace pleural fluid. There is  a small pericardial effusion with a more prominent component towards the left side of the heart. Heart is slightly enlarged. Hepatobiliary: The liver of several small bright T2, low T1 nonenhancing foci consistent with benign cysts. Many are under a cm and too small to completely characterize but are nonaggressive and likely benign cystic lesions or hamartomas. There is one lesion which is bright on T2 with progressive nodular enhancement in segment 4 consistent with a hemangioma. On series 25, image 53 this measures 2.4 x 1.4 cm. Second smaller hemangioma  best seen on series 25, image 48 in segment 7. Patent portal vein. No intrahepatic biliary ductal dilatation. The common duct has a diameter approaching 3-4 mm. Normal tapering towards the pancreatic head The gallbladder is mildly distended. There is wall thickening as well as adjacent edema. Multiple stone seen towards the neck of the gallbladder. Question stone towards the cystic duct as well acute cholecystitis is possible. Pancreas: Global atrophy of the pancreas. No obvious mass. Slightly low T1 signal. Please correlate for any previous history of pancreatitis remotely. Simple appearing cyst once again in the body/neck of the pancreas measuring 13 mm. Spleen: Within normal limits in size and appearance. Adrenals/Urinary Tract: Adrenal glands are preserved. Kidneys are without enhancing mass. No collecting system dilatation. There is perinephric stranding. Small subcentimeter nonenhancing benign-appearing renal cysts are identified. No specific imaging follow-up. Stomach/Bowel: Stomach is nondilated. Moderate colonic stool. Small bowel is nondilated. Vascular/Lymphatic: Normal caliber aorta and IVC with some atherosclerotic changes. No specific abnormal lymph node enlargement seen in the visualized abdomen. Other: No ascites identified in the visualized abdomen. Motion artifact. Musculoskeletal: Scattered degenerative changes. IMPRESSION: Distended gallbladder with wall edema, thickening and adjacent fluid. Several stones identified towards the neck region and there is a possible stone towards the cystic duct. Please correlate for acute cholecystitis. No biliary ductal dilatation. Multiple hepatic and renal benign-appearing cysts. There are 2 lesions in the liver elsewhere consistent with hemangiomas. Benign-appearing 13 mm pancreatic cyst. Recommend follow up imaging in 2 years. Motion artifact. Electronically Signed   By: Karen Kays M.D.   On: 07/26/2022 13:44   ECHOCARDIOGRAM COMPLETE  Result Date:  07/26/2022    ECHOCARDIOGRAM REPORT   Patient Name:   JAHZIEL LURRY Date of Exam: 07/26/2022 Medical Rec #:  [id number]     Height:       71.0 in Accession #:    [id number]    Weight:       205.7 lb Date of Birth:  10/28/1940     BSA:          2.134 m Patient Age:    81 years      BP:           148/58 mmHg Patient Gender: M             HR:           55 bpm. [facility] Procedure: 2D Echo, Cardiac Doppler, Color Doppler, 3D Echo and Strain Analysis Indications:    Chest Pain R07.9  History:        Patient has prior history of Echocardiogram examinations, most                 recent 05/21/2021. CAD, Pericardial Disease and Previous                 Myocardial Infarction, Arrythmias:Atrial Fibrillation,                 Signs/Symptoms:Chest  Pain; Risk Factors:Dyslipidemia,                 Hypertension, Diabetes and Former Smoker.  Sonographer:    Aron Baba Referring Phys: [id number] ASIA B ZIERLE-GHOSH  Sonographer Comments: Image acquisition challenging due to respiratory motion. Global longitudinal strain was attempted. IMPRESSIONS  1. Left ventricular ejection fraction, by estimation, is 70 to 75%. The left ventricle has hyperdynamic function. The left ventricle has no regional wall motion abnormalities. There is severe left ventricular hypertrophy. Left ventricular diastolic parameters are consistent with Grade I diastolic dysfunction (impaired relaxation). Elevated left atrial pressure. The average left ventricular global longitudinal strain is -20.0 %. The global longitudinal strain is normal.  2. Right ventricular systolic function is normal. The right ventricular size is normal. Tricuspid regurgitation signal is inadequate for assessing PA pressure.  3. Left atrial size was severely dilated.  4. A small pericardial effusion is present. The pericardial effusion is circumferential. There is no evidence of cardiac tamponade.  5. The mitral valve is normal in structure. No evidence of mitral valve  regurgitation. No evidence of mitral stenosis.  6. The aortic valve is tricuspid. There is moderate calcification of the aortic valve. There is moderate thickening of the aortic valve. Aortic valve regurgitation is not visualized. Mild aortic valve stenosis.  7. The inferior vena cava is normal in size with greater than 50% respiratory variability, suggesting right atrial pressure of 3 mmHg. FINDINGS  Left Ventricle: Left ventricular ejection fraction, by estimation, is 70 to 75%. The left ventricle has hyperdynamic function. The left ventricle has no regional wall motion abnormalities. The average left ventricular global longitudinal strain is -20.0  %. The global longitudinal strain is normal. The left ventricular internal cavity size was normal in size. There is severe left ventricular hypertrophy. Left ventricular diastolic parameters are consistent with Grade I diastolic dysfunction (impaired relaxation). Elevated left atrial pressure. Right Ventricle: The right ventricular size is normal. Right vetricular wall thickness was not well visualized. Right ventricular systolic function is normal. Tricuspid regurgitation signal is inadequate for assessing PA pressure. Left Atrium: Left atrial size was severely dilated. Right Atrium: Right atrial size was normal in size. Pericardium: A small pericardial effusion is present. The pericardial effusion is circumferential. There is no evidence of cardiac tamponade. Mitral Valve: The mitral valve is normal in structure. No evidence of mitral valve regurgitation. No evidence of mitral valve stenosis. Tricuspid Valve: The tricuspid valve is normal in structure. Tricuspid valve regurgitation is trivial. No evidence of tricuspid stenosis. Aortic Valve: The aortic valve is tricuspid. There is moderate calcification of the aortic valve. There is moderate thickening of the aortic valve. There is moderate aortic valve annular calcification. Aortic valve regurgitation is not  visualized. Mild aortic stenosis is present. Aortic valve mean gradient measures 11.0 mmHg. Aortic valve peak gradient measures 18.8 mmHg. Aortic valve area, by VTI measures 1.94 cm. Pulmonic Valve: The pulmonic valve was not well visualized. Pulmonic valve regurgitation is mild. No evidence of pulmonic stenosis. Aorta: The aortic root is normal in size and structure. Venous: The inferior vena cava is normal in size with greater than 50% respiratory variability, suggesting right atrial pressure of 3 mmHg. IAS/Shunts: No atrial level shunt detected by color flow Doppler.  LEFT VENTRICLE PLAX 2D LVIDd:         3.60 cm   Diastology LVIDs:         2.10 cm   LV e' medial:    3.59 cm/s LV PW:  1.70 cm   LV E/e' medial:  27.9 LV IVS:        1.70 cm   LV e' lateral:   3.42 cm/s LVOT diam:     2.00 cm   LV E/e' lateral: 29.2 LV SV:         106 LV SV Index:   50        2D Longitudinal Strain LVOT Area:     3.14 cm  2D Strain GLS Avg:     -20.0 %                           3D Volume EF:                          3D EF:        58 %                          LV EDV:       144 ml                          LV ESV:       61 ml                          LV SV:        84 ml RIGHT VENTRICLE RV S prime:     11.20 cm/s TAPSE (M-mode): 1.6 cm LEFT ATRIUM              Index        RIGHT ATRIUM           Index LA diam:        5.00 cm  2.34 cm/m   RA Area:     18.40 cm LA Vol (A2C):   160.0 ml 74.98 ml/m  RA Volume:   38.40 ml  18.00 ml/m LA Vol (A4C):   141.0 ml 66.08 ml/m LA Biplane Vol: 155.0 ml 72.64 ml/m  AORTIC VALVE                     PULMONIC VALVE AV Area (Vmax):    2.01 cm      PR End Diast Vel: 7.29 msec AV Area (Vmean):   1.81 cm AV Area (VTI):     1.94 cm AV Vmax:           217.00 cm/s AV Vmean:          152.500 cm/s AV VTI:            0.550 m AV Peak Grad:      18.8 mmHg AV Mean Grad:      11.0 mmHg LVOT Vmax:         139.00 cm/s LVOT Vmean:        87.900 cm/s LVOT VTI:          0.339 m LVOT/AV VTI ratio: 0.62   AORTA Ao Root diam: 3.70 cm Ao Asc diam:  3.40 cm MITRAL VALVE MV Area (PHT): 1.71 cm     SHUNTS MV Decel Time: 444 msec     Systemic VTI:  0.34 m MR Peak grad: 8.9 mmHg      Systemic Diam: 2.00 cm MR Vmax:      149.00 cm/s MV E velocity: 100.00 cm/s  MV A velocity: 143.00 cm/s MV E/A ratio:  0.70 Dina Rich MD Electronically signed by Dina Rich MD Signature Date/Time: 07/26/2022/12:29:25 PM    Final    US Abdomen Limited RUQ (LIVER/GB)  Result Date: 07/26/2022 CLINICAL DATA:  Abnormal liver function tests EXAM: ULTRASOUND ABDOMEN LIMITED RIGHT UPPER QUADRANT COMPARISON:  CT 05/15/2022 FINDINGS: Gallbladder: Distended gallbladder with wall thickening measuring up to 5 mm. Wall edema as well. Possible component of sludge as well. No shadowing stones. There is a rounded area measuring 5 mm within the gallbladder lumen without shadowing. Possible polyp or tumefactive sludge. Common bile duct: Diameter: 5 mm Liver: Mildly echogenic hepatic parenchyma. Portal vein is patent on color Doppler imaging with normal direction of blood flow towards the liver. Other: None. IMPRESSION: Mild fatty liver infiltration.  No ductal dilatation. There is gallbladder wall thickening and edema with some possible sludge. There is also a nodular focus without shadowing measuring 5 mm. Possible small gallbladder polyp. Further workup as clinically appropriate such as MRI Electronically Signed   By: Karen Kays M.D.   On: 07/26/2022 10:04   DG Chest Port 1 View  Result Date: 07/25/2022 CLINICAL DATA:  Chest pain EXAM: PORTABLE CHEST 1 VIEW COMPARISON:  Chest x-ray 11/19/2021. CT abdomen and pelvis 05/15/2022. CT chest abdomen and pelvis 11/08/2021. FINDINGS: Heart is mildly enlarged, unchanged. Small nodular density seen in the right costophrenic angle, not seen on prior CT. The lungs are otherwise clear. There is no pleural effusion or pneumothorax. No acute fractures are seen. IMPRESSION: 1. No acute cardiopulmonary  process. 2. Small nodular density in the right costophrenic angle, not seen on prior CT. Follow-up chest CT recommended, non emergently. Electronically Signed   By: Darliss Cheney M.D.   On: 07/25/2022 21:26    Micro Results  Recent Results (from the past 240 hour(s))  Surgical PCR screen     Status: Abnormal   Collection Time: 07/28/22 12:24 AM   Specimen: Nasal Mucosa; Nasal Swab  Result Value Ref Range Status   MRSA, PCR NEGATIVE NEGATIVE Final   Staphylococcus aureus POSITIVE (A) NEGATIVE Final    Comment: RESULT CALLED TO, READ BACK BY AND VERIFIED WITH: ODELL @ [id number] ON [id number] BY HENDERSON L (NOTE) The Xpert SA Assay (FDA approved for NASAL specimens in patients 46 years of age and older), is one component of a comprehensive surveillance program. It is not intended to diagnose infection nor to guide or monitor treatment. Performed at [facility], [address], [place], [place] [address]    Today   Subjective    Maxson Soqui today has no new complaints - Wife is at bedside -Eating and drinking well -Ambulating out of the room  No Nausea, Vomiting or Diarrhea No fever  Or chills    Patient has been seen and examined prior to discharge   Objective   Blood pressure (!) 150/56, pulse (!) 52, temperature 98.1 F (36.7 C), temperature source Oral, resp. rate 20, height 5\' 11"  (1.803 m), weight 93.3 kg, SpO2 95 %.   Intake/Output Summary (Last 24 hours) at 07/29/2022 1141 Last data filed at 07/29/2022 0900 Gross per 24 hour  Intake 1728 ml  Output 320 ml  Net 1408 ml    Exam Gen:-Sleepy, after surgery ,in no apparent distress  HEENT:- [place].AT, No significant sclera icterus Ears--HOH Neck-Supple Neck,No JVD,.  Lungs-  CTAB , fair symmetrical air movement CV- S1, S2 normal, regular  Abd-  +ve B.Sounds, Abd Soft, appropriate postop tenderness, intact and  hemostatic post -op Wounds extremity/Skin:- No  edema, pedal pulses present  Psych-affect is appropriate, oriented  x3 Neuro-no new focal deficits, no tremors   Data Review   CBC w Diff:  Lab Results  Component Value Date   WBC 9.8 07/29/2022   HGB 11.5 (L) 07/29/2022   HCT 35.1 (L) 07/29/2022   PLT 140 (L) 07/29/2022   LYMPHOPCT 10 07/25/2022   MONOPCT 8 07/25/2022   EOSPCT 0 07/25/2022   BASOPCT 0 07/25/2022   CMP:  Lab Results  Component Value Date   NA 134 (L) 07/29/2022   K 4.4 07/29/2022   CL 104 07/29/2022   CO2 24 07/29/2022   BUN 18 07/29/2022   CREATININE 0.91 07/29/2022   PROT 6.0 (L) 07/29/2022   ALBUMIN 3.4 (L) 07/29/2022   BILITOT 0.6 07/29/2022   ALKPHOS 132 (H) 07/29/2022   AST 78 (H) 07/29/2022   ALT 223 (H) 07/29/2022  . Total Discharge time is about 33 minutes  Shon Hale M.D on 07/29/2022 at 11:41 AM  Go to [email or web address] -  for contact info  [facility] - Office  [phone]

## 2022-07-29 NOTE — Discharge Instructions (Addendum)
1)Please Hold Eliquis/apixaban until Monday, 07/31/2022 2)Please Hold Atorvastatin/Lipitor until Monday, 08/07/2022 3)Avoid ibuprofen/Advil/Aleve/Motrin/Goody Powders/Naproxen/BC powders/Meloxicam/Diclofenac/Indomethacin and other Nonsteroidal anti-inflammatory medications as these will make you more likely to bleed and can cause stomach ulcers, can also cause Kidney problems.  4)Repeat CBC and CMP Blood Tests in 1 week with Richardean Chimera, MD (primary care provider) 5)Avoid Lifting more than 20 pounds for the next 6 weeks 6)Follow-up with general surgeon Dr. Michelle Nasuti in 2 weeks as advised  Surgery Discharge Instructions  Activity  You are advised to go directly home from the hospital.  Restrict your activities and rest for a day.  Resume light activity tomorrow. No heavy lifting over 10 lbs or strenuous exercise.  Fluids and Diet Regular diet  Medications  If you have not had a bowel movement in 24 hours, take 2 tablespoons over the counter Milk of mag.             You May resume your blood thinners on Monday You are being discharged with prescriptions for Opioid/Narcotic Medications: There are some specific considerations for these medications that you should know. Opioid Meds have risks & benefits. Addiction to these meds is always a concern with prolonged use Take medication only as directed Do not drive while taking narcotic pain medication Do not crush tablets or capsules Do not use a different container than medication was dispensed in Lock the container of medication in a cool, dry place out of reach of children and pets. Opioid medication can cause addiction Do not share with anyone else (this is a felony) Do not store medications for future use. Dispose of them properly.     Disposal:  Find a [employer] household drug take back site near you.  If you can't get to a drug take back site, use the recipe below as a last resort to dispose of expired, unused or unwanted  drugs. Disposal  (Do not dispose chemotherapy drugs this way, talk to your prescribing doctor instead.) Step 1: Mix drugs (do not crush) with dirt, kitty litter, or used coffee grounds and add a small amount of water to dissolve any solid medications. Step 2: Seal drugs in plastic bag. Step 3: Place plastic bag in trash. Step 4: Take prescription container and scratch out personal information, then recycle or throw away.  Operative Site  You have a liquid bandage over your incisions, this will begin to flake off in about a week. Ok to English as a second language teacher. Keep wound clean and dry. No baths or swimming. No lifting more than 10 pounds.  Contact Information: If you have questions or concerns, please call our office, [phone], Monday- Thursday 8AM-5PM and Friday 8AM-12Noon.  If it is after hours or on the weekend, please call [facility] Main Number, [phone], and ask to speak to the surgeon on call for Dr. Robyne Peers at [facility].   SPECIFIC COMPLICATIONS TO WATCH FOR: Inability to urinate Fever over 101? F by mouth Nausea and vomiting lasting longer than 24 hours. Pain not relieved by medication ordered Swelling around the operative site Increased redness, warmth, hardness, around operative area Numbness, tingling, or cold fingers or toes Blood -soaked dressing, (small amounts of oozing may be normal) Increasing and progressive drainage from surgical area or exam site

## 2022-07-29 NOTE — Progress Notes (Addendum)
[facility] Progress Note  1 Day Post-Op  Subjective: Patient seen and examined.  He is resting comfortably in bed.  He tolerated his diet without nausea and vomiting.  He denies any significant abdominal pain.  Denies any bowel movement since surgery.  Objective: Vital signs in last 24 hours: Temp:  [97.7 F (36.5 C)-98.9 F (37.2 C)] 98.1 F (36.7 C) (05/04 0453) Pulse Rate:  [52-68] 52 (05/04 0453) Resp:  [10-20] 20 (05/03 2003) BP: (130-184)/(54-71) 150/56 (05/04 0453) SpO2:  [92 %-97 %] 95 % (05/04 0453) Last BM Date : 07/24/22  Intake/Output from previous day: 05/03 0701 - 05/04 0700 In: 1488 [P.O.:240; I.V.:1048; IV Piggyback:200] Out: 320 [Urine:300; Blood:20] Intake/Output this shift: Total I/O In: 240 [P.O.:240] Out: -   General appearance: alert, cooperative, and no distress GI: Abdomen soft, nondistended, no percussion tenderness, nontender to palpation; no rigidity, guarding, rebound tenderness; incisions C/D/I with Dermabond in place  Lab Results:  Recent Labs    07/28/22 0519 07/29/22 0442  WBC 4.4 9.8  HGB 11.2* 11.5*  HCT 34.1* 35.1*  PLT 131* 140*   BMET Recent Labs    07/28/22 0519 07/29/22 0442  NA 137 134*  K 3.7 4.4  CL 108 104  CO2 22 24  GLUCOSE 143* 215*  BUN 11 18  CREATININE 0.74 0.91  CALCIUM 8.9 9.1   PT/INR No results for input(s): "LABPROT", "INR" in the last 72 hours.  Studies/Results: No results found.  Anti-infectives: Anti-infectives (From admission, onward)    Start     Dose/Rate Route Frequency Ordered Stop   07/26/22 1845  metroNIDAZOLE (FLAGYL) IVPB 500 mg  Status:  Discontinued        500 mg 100 mL/hr over 60 Minutes Intravenous Every 12 hours 07/26/22 1751 07/28/22 1340   07/26/22 1500  cefTRIAXone (ROCEPHIN) 2 g in sodium chloride 0.9 % 100 mL IVPB  Status:  Discontinued        2 g 200 mL/hr over 30 Minutes Intravenous Every 24 hours 07/26/22 1417 07/28/22 1340        Assessment/Plan:  Patient is an 82 year old male who is status post robotic assisted laparoscopic cholecystectomy on 5/3.  -Patient's blood work with improving LFTs and no leukocytosis -No need for antibiotics from general surgery standpoint -Regular diet -PRN pain control and antiemetics -Patient stable for discharge from general surgery standpoint -Patient should resume his Eliquis on Monday -Will follow-up with me in 2 weeks   LOS: 2 days    Granger Chui A Daniela Siebers 07/29/2022

## 2022-07-29 NOTE — Plan of Care (Signed)
  Problem: Education: Goal: Knowledge of General Education information will improve Description: Including pain rating scale, medication(s)/side effects and non-pharmacologic comfort measures Outcome: Adequate for Discharge   Problem: Health Behavior/Discharge Planning: Goal: Ability to manage health-related needs will improve Outcome: Adequate for Discharge   Problem: Clinical Measurements: Goal: Ability to maintain clinical measurements within normal limits will improve Outcome: Adequate for Discharge Goal: Will remain free from infection Outcome: Adequate for Discharge Goal: Diagnostic test results will improve Outcome: Adequate for Discharge Goal: Respiratory complications will improve Outcome: Adequate for Discharge Goal: Cardiovascular complication will be avoided Outcome: Adequate for Discharge   Problem: Activity: Goal: Risk for activity intolerance will decrease Outcome: Adequate for Discharge   Problem: Nutrition: Goal: Adequate nutrition will be maintained Outcome: Adequate for Discharge   Problem: Coping: Goal: Level of anxiety will decrease Outcome: Adequate for Discharge   Problem: Elimination: Goal: Will not experience complications related to bowel motility Outcome: Adequate for Discharge Goal: Will not experience complications related to urinary retention Outcome: Adequate for Discharge   Problem: Pain Managment: Goal: General experience of comfort will improve Outcome: Adequate for Discharge   Problem: Safety: Goal: Ability to remain free from injury will improve Outcome: Adequate for Discharge   Problem: Skin Integrity: Goal: Risk for impaired skin integrity will decrease Outcome: Adequate for Discharge   Problem: Education: Goal: Understanding of cardiac disease, CV risk reduction, and recovery process will improve Outcome: Adequate for Discharge Goal: Individualized Educational Video(s) Outcome: Adequate for Discharge   Problem:  Activity: Goal: Ability to tolerate increased activity will improve Outcome: Adequate for Discharge   Problem: Cardiac: Goal: Ability to achieve and maintain adequate cardiovascular perfusion will improve Outcome: Adequate for Discharge   Problem: Health Behavior/Discharge Planning: Goal: Ability to safely manage health-related needs after discharge will improve Outcome: Adequate for Discharge   Problem: Education: Goal: Ability to describe self-care measures that may prevent or decrease complications (Diabetes Survival Skills Education) will improve Outcome: Adequate for Discharge Goal: Individualized Educational Video(s) Outcome: Adequate for Discharge   Problem: Coping: Goal: Ability to adjust to condition or change in health will improve Outcome: Adequate for Discharge   Problem: Fluid Volume: Goal: Ability to maintain a balanced intake and output will improve Outcome: Adequate for Discharge   Problem: Health Behavior/Discharge Planning: Goal: Ability to identify and utilize available resources and services will improve Outcome: Adequate for Discharge Goal: Ability to manage health-related needs will improve Outcome: Adequate for Discharge   Problem: Metabolic: Goal: Ability to maintain appropriate glucose levels will improve Outcome: Adequate for Discharge   Problem: Nutritional: Goal: Maintenance of adequate nutrition will improve Outcome: Adequate for Discharge Goal: Progress toward achieving an optimal weight will improve Outcome: Adequate for Discharge   Problem: Skin Integrity: Goal: Risk for impaired skin integrity will decrease Outcome: Adequate for Discharge   Problem: Tissue Perfusion: Goal: Adequacy of tissue perfusion will improve Outcome: Adequate for Discharge   

## 2022-07-30 NOTE — Anesthesia Postprocedure Evaluation (Signed)
Anesthesia Post Note  Patient: Derik Scherff Considine  Procedure(s) Performed: XI ROBOTIC ASSISTED LAPAROSCOPIC CHOLECYSTECTOMY (Abdomen)  Patient location during evaluation: Phase II Anesthesia Type: General Level of consciousness: awake Pain management: pain level controlled Vital Signs Assessment: post-procedure vital signs reviewed and stable Respiratory status: spontaneous breathing and respiratory function stable Cardiovascular status: blood pressure returned to baseline and stable Postop Assessment: no headache and no apparent nausea or vomiting Anesthetic complications: no Comments: Late entry   No notable events documented.   Last Vitals:  Vitals:   07/28/22 2216 07/29/22 0453  BP: (!) 135/58 (!) 150/56  Pulse: 68 (!) 52  Resp:    Temp: 37.2 C 36.7 C  SpO2: 95% 95%    Last Pain:  Vitals:   07/29/22 0800  TempSrc:   PainSc: 0-No pain                 Windell Norfolk

## 2022-07-31 LAB — SURGICAL PATHOLOGY

## 2022-08-10 DIAGNOSIS — Z13220 Encounter for screening for lipoid disorders: Secondary | ICD-10-CM | POA: Diagnosis not present

## 2022-08-10 DIAGNOSIS — I4891 Unspecified atrial fibrillation: Secondary | ICD-10-CM | POA: Diagnosis not present

## 2022-08-10 DIAGNOSIS — E1165 Type 2 diabetes mellitus with hyperglycemia: Secondary | ICD-10-CM | POA: Diagnosis not present

## 2022-08-10 DIAGNOSIS — M1000 Idiopathic gout, unspecified site: Secondary | ICD-10-CM | POA: Diagnosis not present

## 2022-08-10 DIAGNOSIS — C189 Malignant neoplasm of colon, unspecified: Secondary | ICD-10-CM | POA: Diagnosis not present

## 2022-08-10 DIAGNOSIS — Z6828 Body mass index (BMI) 28.0-28.9, adult: Secondary | ICD-10-CM | POA: Diagnosis not present

## 2022-08-10 DIAGNOSIS — Z1329 Encounter for screening for other suspected endocrine disorder: Secondary | ICD-10-CM | POA: Diagnosis not present

## 2022-08-10 DIAGNOSIS — K2100 Gastro-esophageal reflux disease with esophagitis, without bleeding: Secondary | ICD-10-CM | POA: Diagnosis not present

## 2022-08-16 ENCOUNTER — Inpatient Hospital Stay: Payer: Medicare HMO | Attending: Hematology

## 2022-08-16 ENCOUNTER — Ambulatory Visit (HOSPITAL_COMMUNITY)
Admission: RE | Admit: 2022-08-16 | Discharge: 2022-08-16 | Disposition: A | Discharge status: Home / Self Care | Payer: Medicare HMO | Source: Ambulatory Visit | Attending: Hematology | Admitting: Hematology

## 2022-08-16 DIAGNOSIS — Z8546 Personal history of malignant neoplasm of prostate: Secondary | ICD-10-CM | POA: Insufficient documentation

## 2022-08-16 DIAGNOSIS — C184 Malignant neoplasm of transverse colon: Secondary | ICD-10-CM | POA: Insufficient documentation

## 2022-08-16 DIAGNOSIS — D649 Anemia, unspecified: Secondary | ICD-10-CM | POA: Diagnosis not present

## 2022-08-16 DIAGNOSIS — K7689 Other specified diseases of liver: Secondary | ICD-10-CM | POA: Diagnosis not present

## 2022-08-16 DIAGNOSIS — C801 Malignant (primary) neoplasm, unspecified: Secondary | ICD-10-CM | POA: Diagnosis not present

## 2022-08-16 DIAGNOSIS — Z87891 Personal history of nicotine dependence: Secondary | ICD-10-CM | POA: Diagnosis not present

## 2022-08-16 DIAGNOSIS — C785 Secondary malignant neoplasm of large intestine and rectum: Secondary | ICD-10-CM | POA: Diagnosis not present

## 2022-08-16 DIAGNOSIS — Z85038 Personal history of other malignant neoplasm of large intestine: Secondary | ICD-10-CM | POA: Insufficient documentation

## 2022-08-16 DIAGNOSIS — D509 Iron deficiency anemia, unspecified: Secondary | ICD-10-CM

## 2022-08-16 LAB — CBC WITH DIFFERENTIAL/PLATELET
Abs Immature Granulocytes: 0.03 10*3/uL (ref 0.00–0.07)
Basophils Absolute: 0 10*3/uL (ref 0.0–0.1)
Basophils Relative: 1 %
Eosinophils Absolute: 0.1 10*3/uL (ref 0.0–0.5)
Eosinophils Relative: 1 %
HCT: 38 % — ABNORMAL LOW (ref 39.0–52.0)
Hemoglobin: 12.3 g/dL — ABNORMAL LOW (ref 13.0–17.0)
Immature Granulocytes: 0 %
Lymphocytes Relative: 17 %
Lymphs Abs: 1.2 10*3/uL (ref 0.7–4.0)
MCH: 31.1 pg (ref 26.0–34.0)
MCHC: 32.4 g/dL (ref 30.0–36.0)
MCV: 96 fL (ref 80.0–100.0)
Monocytes Absolute: 0.6 10*3/uL (ref 0.1–1.0)
Monocytes Relative: 9 %
Neutro Abs: 5 10*3/uL (ref 1.7–7.7)
Neutrophils Relative %: 72 %
Platelets: 178 10*3/uL (ref 150–400)
RBC: 3.96 MIL/uL — ABNORMAL LOW (ref 4.22–5.81)
RDW: 14.9 % (ref 11.5–15.5)
WBC: 7 10*3/uL (ref 4.0–10.5)
nRBC: 0 % (ref 0.0–0.2)

## 2022-08-16 LAB — COMPREHENSIVE METABOLIC PANEL
ALT: 25 U/L (ref 0–44)
AST: 26 U/L (ref 15–41)
Albumin: 4.4 g/dL (ref 3.5–5.0)
Alkaline Phosphatase: 84 U/L (ref 38–126)
Anion gap: 11 (ref 5–15)
BUN: 28 mg/dL — ABNORMAL HIGH (ref 8–23)
CO2: 23 mmol/L (ref 22–32)
Calcium: 9.9 mg/dL (ref 8.9–10.3)
Chloride: 104 mmol/L (ref 98–111)
Creatinine, Ser: 1.31 mg/dL — ABNORMAL HIGH (ref 0.61–1.24)
GFR, Estimated: 54 mL/min — ABNORMAL LOW (ref >60)
Glucose, Bld: 102 mg/dL — ABNORMAL HIGH (ref 70–99)
Potassium: 4.5 mmol/L (ref 3.5–5.1)
Sodium: 138 mmol/L (ref 135–145)
Total Bilirubin: 0.5 mg/dL (ref 0.3–1.2)
Total Protein: 7 g/dL (ref 6.5–8.1)

## 2022-08-16 LAB — IRON AND TIBC
Iron: 46 ug/dL (ref 45–182)
Saturation Ratios: 14 % — ABNORMAL LOW (ref 17.9–39.5)
TIBC: 322 ug/dL (ref 250–450)
UIBC: 276 ug/dL

## 2022-08-16 LAB — FERRITIN: Ferritin: 48 ng/mL (ref 24–336)

## 2022-08-16 MED ORDER — IOHEXOL 300 MG/ML  SOLN
100.0000 mL | Freq: Once | INTRAMUSCULAR | Status: AC | PRN
  Administered 2022-08-16: 100 mL via INTRAVENOUS

## 2022-08-16 MED ORDER — IOHEXOL 9 MG/ML PO SOLN
ORAL | Status: AC
  Filled 2022-08-16: qty 500

## 2022-08-17 DIAGNOSIS — C189 Malignant neoplasm of colon, unspecified: Secondary | ICD-10-CM | POA: Diagnosis not present

## 2022-08-17 DIAGNOSIS — I4891 Unspecified atrial fibrillation: Secondary | ICD-10-CM | POA: Diagnosis not present

## 2022-08-17 DIAGNOSIS — G2581 Restless legs syndrome: Secondary | ICD-10-CM | POA: Diagnosis not present

## 2022-08-17 DIAGNOSIS — J301 Allergic rhinitis due to pollen: Secondary | ICD-10-CM | POA: Diagnosis not present

## 2022-08-17 DIAGNOSIS — D692 Other nonthrombocytopenic purpura: Secondary | ICD-10-CM | POA: Diagnosis not present

## 2022-08-17 DIAGNOSIS — I10 Essential (primary) hypertension: Secondary | ICD-10-CM | POA: Diagnosis not present

## 2022-08-17 DIAGNOSIS — E6609 Other obesity due to excess calories: Secondary | ICD-10-CM | POA: Diagnosis not present

## 2022-08-17 DIAGNOSIS — M1000 Idiopathic gout, unspecified site: Secondary | ICD-10-CM | POA: Diagnosis not present

## 2022-08-17 DIAGNOSIS — Z6828 Body mass index (BMI) 28.0-28.9, adult: Secondary | ICD-10-CM | POA: Diagnosis not present

## 2022-08-17 DIAGNOSIS — D509 Iron deficiency anemia, unspecified: Secondary | ICD-10-CM | POA: Diagnosis not present

## 2022-08-17 DIAGNOSIS — E1169 Type 2 diabetes mellitus with other specified complication: Secondary | ICD-10-CM | POA: Diagnosis not present

## 2022-08-17 DIAGNOSIS — E7849 Other hyperlipidemia: Secondary | ICD-10-CM | POA: Diagnosis not present

## 2022-08-18 LAB — CEA: CEA: 5.3 ng/mL — ABNORMAL HIGH (ref 0.0–4.7)

## 2022-08-22 ENCOUNTER — Encounter: Payer: Self-pay | Admitting: Surgery

## 2022-08-22 ENCOUNTER — Ambulatory Visit (INDEPENDENT_AMBULATORY_CARE_PROVIDER_SITE_OTHER): Payer: Medicare HMO | Admitting: Surgery

## 2022-08-22 VITALS — BP 116/65 | HR 63 | Temp 98.0°F | Resp 12 | Ht 71.0 in | Wt 211.0 lb

## 2022-08-22 DIAGNOSIS — Z09 Encounter for follow-up examination after completed treatment for conditions other than malignant neoplasm: Secondary | ICD-10-CM

## 2022-08-22 NOTE — Progress Notes (Signed)
[facility] [address], [place] [address]    Clinic Day:  08/23/2022  Referring physician: Richardean Chimera, MD  Patient Care Team: Richardean Chimera, MD as PCP - General (Unknown Physician Specialty) Jonelle Sidle, MD as PCP - Cardiology (Cardiology) Doreatha Massed, MD as Medical Oncologist (Medical Oncology)   ASSESSMENT & PLAN:   Assessment: 1. Colon cancer: - Colonoscopy on 10/12/2020 with 7 sessile polyps found in the transverse colon and cecum.  12 mm polyp found in the transverse colon, sessile.  Polyp was removed with piecemeal technique using cold snare.  2 sessile polyps found in the sigmoid colon, removed with cold snare. - Pathology cecal tubular adenoma, sessile serrated polyp of the transverse colon polypectomy, adenocarcinoma arising from a tubular adenoma with high-grade dysplasia of the transverse colon polypectomy.  Sigmoid colon polypectomy showed tubular adenoma, hyperplastic polyp. - Colonoscopy on 02/25/2021 with three 3 to 5 mm polyps in the transverse colon, removed with cold snare.  One 10 mm polyp in the descending colon at 54 cm proximal to the anus. - Pathology on 02/25/2021 shows invasive adenocarcinoma, poorly differentiated, grade 3 of the descending colon polypectomy.  CDX2 positive.  Other findings include transverse colon tubular adenoma and sessile serrated adenoma. - Right hemicolectomy on 05/20/2021. - Pathology shows grade 3 adenocarcinoma, mucinous features, 1.7 cm in the transverse colon, metastatic adenocarcinoma involving 2/29 lymph nodes.  No LVI/perineural invasion.  pT1, PN 1B. - Adjuvant Xeloda 1500 mg 2 weeks on/1 week off started on 07/06/2021.  Cycle 2 on 07/27/2021, 3 tablets twice daily.  Cycle 3 on 08/26/2021, 2 tablets twice daily 2 weeks on/1 week off, dose reduced secondary to HFSR.  Cycle 4 on 09/16/2021.  Cycle 5 Xeloda 1000 mg twice daily on 10/08/2021.  Completed cycle 8 on 01/30/2022.  2. Social/family  history: He lives at home with his wife.  He quit smoking 23 years ago.  He smoked 1 pack/day for 7 years.  He worked in textile's prior to retirement.  He is active and walks 2 miles per day. - Father had prostate cancer.  Brother had lung cancer.  Another brother had colon cancer (? colon), Sister with lung cancer, another sister with cancer, patient unknown.  3.  Prostate cancer: - He was diagnosed with prostate cancer in August 2007, status post seed implants.  His PSA has been undetectable since then.    Plan: 1. Stage III (T1N1B) transverse colon adenocarcinoma: - No change in bowel habits or bleeding per rectum. - Reviewed labs from 08/16/2022: CEA is 5.3.  LFTs are normal.  Creatinine elevated at 1.31.  He was reportedly started on spironolactone 25 mg half tablet in March 2024. - CT CAP on 08/16/2022: Mild increase in size of 11 mm mesenteric lymph node from 8 mm previously.  Other mild mesenteric and right common iliac lymph nodes stable. - Recommend follow-up in 3 months with repeat CEA and LFTs.  Will consider scan in 6 months.  2.  Normocytic anemia: - Hemoglobin is 12.3.  Ferritin is 48 and percent saturation is 14. - Continue iron tablet twice daily.  Will repeat ferritin and iron panel in 3 months.   Orders Placed This Encounter  Procedures   Comprehensive metabolic panel    Standing Status:   Future    Standing Expiration Date:   08/23/2023    Order Specific Question:   Release to patient    Answer:   Immediate   CBC  Standing Status:   Future    Standing Expiration Date:   08/23/2023   CEA    Standing Status:   Future    Standing Expiration Date:   08/23/2023   Ferritin    Standing Status:   Future    Standing Expiration Date:   08/23/2023    Order Specific Question:   Release to patient    Answer:   Immediate   Iron and TIBC    Standing Status:   Future    Standing Expiration Date:   08/23/2023    Order Specific Question:   Release to patient    Answer:    Immediate      I,Katie Daubenspeck,acting as a scribe for Doreatha Massed, MD.,have documented all relevant documentation on the behalf of Doreatha Massed, MD,as directed by  Doreatha Massed, MD while in the presence of Doreatha Massed, MD.   I, Doreatha Massed MD, have reviewed the above documentation for accuracy and completeness, and I agree with the above.   Doreatha Massed, MD   5/29/20245:40 PM  CHIEF COMPLAINT:   Diagnosis: colon cancer    Cancer Staging  Colon cancer [facility]) Staging form: Colon and Rectum, AJCC 8th Edition - Clinical stage from 03/22/2021: Stage I (cT1, cN0, cM0) - Unsigned - Pathologic stage from 06/23/2021: Stage IIIA (pT1, pN1b, cM0) - Unsigned    Prior Therapy: 1. Right hemicolectomy on 05/20/21 2. Adjuvant Xeloda 07/07/22 - 01/30/22  Current Therapy:  surveillance   HISTORY OF PRESENT ILLNESS:   Oncology History   No history exists.     INTERVAL HISTORY:   [name] [name] is a 82 y.o. male presenting to clinic today for follow up of colon cancer. He was last seen by me on 05/22/22.  Since his last visit, he underwent surveillance colonoscopy on 07/24/22 under Dr. Meridee Score showing only four polyps. Pathology showed hyperplastic polyps.  He was admitted on 07/25/22 with unstable angina. He was also found to have elevated LFT's. Abdomen MRI showed: distended gallbladder with wall edema, thickening, adjacent fluid, and several stones. He was taken for cholecystectomy on 07/28/22 under Dr. Robyne Peers.  He also underwent surveillance CT A/P on 08/16/22 showing: mild increase in size of 11 mm mesenteric lymph node in central pelvis; other lymphadenopathy remains stable; stable 14 mm cystic lesion in pancreatic body.  Today, he states that he is doing well overall. His appetite level is at 100%. His energy level is at 75%.  PAST MEDICAL HISTORY:   Past Medical History: Past Medical History:  Diagnosis Date   Anemia    Arthritis    CAD  (coronary artery disease)    DES to circumflex 12/2017   Colon cancer (HCC) 2023   Diabetes mellitus without complication (HCC)    Dysrhythmia    GERD (gastroesophageal reflux disease)    Glaucoma    Gout    Headache    History of kidney stones    Hypertension    NSTEMI (non-ST elevated myocardial infarction) (HCC) 12/31/2017   Paroxysmal atrial fibrillation (HCC)    Pericardial effusion    Idiopathic, recurrent pericardial effusion s/p pericardial window.   Prostate cancer (HCC) 2007   S/P Seed implants    Supraventricular tachycardia    Temporal arteritis [facility])     Surgical History: Past Surgical History:  Procedure Laterality Date   BIOPSY  10/12/2020   Procedure: BIOPSY;  Surgeon: Dolores Frame, MD;  [facility];  Service: Gastroenterology;;   BIOPSY  02/25/2021   Procedure:  BIOPSY;  Surgeon: Marguerita Merles, Reuel Boom, MD;  [facility];  Service: Gastroenterology;;   BIOPSY  04/11/2021   Procedure: BIOPSY;  Surgeon: Lemar Lofty., MD;  Location: [facility];  Service: Gastroenterology;;   CATARACT EXTRACTION W/PHACO Left 06/07/2015   Procedure: CATARACT EXTRACTION PHACO AND INTRAOCULAR LENS PLACEMENT LEFT EYE CDE=8.00;  Surgeon: Gemma Payor, MD;  Location: [facility];  Service: Ophthalmology;  Laterality: Left;   CATARACT EXTRACTION W/PHACO Right 06/17/2015   Procedure: CATARACT EXTRACTION PHACO AND INTRAOCULAR LENS PLACEMENT RIGHT EYE CDE=11.09;  Surgeon: Gemma Payor, MD;  Location: [facility];  Service: Ophthalmology;  Laterality: Right;   COLONOSCOPY WITH PROPOFOL N/A 10/12/2020   Procedure: COLONOSCOPY WITH PROPOFOL;  Surgeon: Dolores Frame, MD;  [facility];  Service: Gastroenterology;  Laterality: N/A;  10:35   COLONOSCOPY WITH PROPOFOL N/A 02/25/2021   Procedure: COLONOSCOPY WITH PROPOFOL;  Surgeon: Dolores Frame, MD;  [facility];  Service: Gastroenterology;  Laterality: N/A;  8:10    COLONOSCOPY WITH PROPOFOL N/A 07/24/2022   Procedure: COLONOSCOPY WITH PROPOFOL;  Surgeon: Meridee Score Netty Starring., MD;  Location: [facility];  Service: Gastroenterology;  Laterality: N/A;   CORONARY ANGIOPLASTY WITH STENT PLACEMENT  01/01/2018   CORONARY STENT INTERVENTION N/A 01/01/2018   Procedure: CORONARY STENT INTERVENTION;  Surgeon: Corky Crafts, MD;  [facility];  Service: Cardiovascular;  Laterality: N/A;   ENDOSCOPIC MUCOSAL RESECTION N/A 04/11/2021   Procedure: ENDOSCOPIC MUCOSAL RESECTION;  Surgeon: Meridee Score Netty Starring., MD;  Location: [facility];  Service: Gastroenterology;  Laterality: N/A;   ENTEROSCOPY N/A 10/12/2020   Procedure: PUSH ENTEROSCOPY;  Surgeon: Dolores Frame, MD;  [facility];  Service: Gastroenterology;  Laterality: N/A;   ENTEROSCOPY N/A 04/11/2021   Procedure: ENTEROSCOPY;  Surgeon: Meridee Score Netty Starring., MD;  Location: [facility];  Service: Gastroenterology;  Laterality: N/A;   ENTEROSCOPY N/A 07/24/2022   Procedure: ENTEROSCOPY;  Surgeon: Meridee Score Netty Starring., MD;  Location: [facility];  Service: Gastroenterology;  Laterality: N/A;   ESOPHAGOGASTRODUODENOSCOPY (EGD) WITH PROPOFOL N/A 10/12/2020   Procedure: ESOPHAGOGASTRODUODENOSCOPY (EGD) WITH PROPOFOL;  Surgeon: Dolores Frame, MD;  [facility];  Service: Gastroenterology;  Laterality: N/A;   FRACTURE SURGERY Left 2010   ankle   HEMOSTASIS CLIP PLACEMENT  04/11/2021   Procedure: HEMOSTASIS CLIP PLACEMENT;  Surgeon: Lemar Lofty., MD;  Location: [facility];  Service: Gastroenterology;;   INSERTION PROSTATE RADIATION SEED  2007   KNEE ARTHROSCOPY Left    LAPAROSCOPY N/A 05/22/2021   Procedure: LAPAROSCOPY DIAGNOSTIC WITH WASHOUT OF ABDOMEN;  Surgeon: Romie Levee, MD;  Location: [facility];  Service: General;  Laterality: N/A;   LEFT HEART CATH AND CORONARY ANGIOGRAPHY N/A 01/01/2018   Procedure: LEFT HEART CATH AND  CORONARY ANGIOGRAPHY;  Surgeon: Corky Crafts, MD;  [facility];  Service: Cardiovascular;  Laterality: N/A;   ORIF TIBIA & FIBULA FRACTURES Left 2003   Distal tibial/fibula    PERICARDIAL WINDOW  02/2007   PERICARDIOCENTESIS  2007   hx/notes 10/19/2011   POLYPECTOMY  10/12/2020   Procedure: POLYPECTOMY;  Surgeon: Dolores Frame, MD;  [facility];  Service: Gastroenterology;;  small bowel, cecal   POLYPECTOMY  02/25/2021   Procedure: POLYPECTOMY;  Surgeon: Dolores Frame, MD;  [facility];  Service: Gastroenterology;;  transverse colon x2   POLYPECTOMY  07/24/2022   Procedure: POLYPECTOMY;  Surgeon: Lemar Lofty., MD;  Location: [facility];  Service: Gastroenterology;;  SUBMUCOSAL LIFTING INJECTION  04/11/2021   Procedure: SUBMUCOSAL LIFTING INJECTION;  Surgeon: Meridee Score Netty Starring., MD;  Location: [facility];  Service: Gastroenterology;;   SUBMUCOSAL TATTOO INJECTION  04/11/2021   Procedure: SUBMUCOSAL TATTOO INJECTION;  Surgeon: Lemar Lofty., MD;  Location: [facility];  Service: Gastroenterology;;    Social History: Social History   Socioeconomic History   Marital status: Married    Spouse name: Not on file   Number of children: Not on file   Years of education: Not on file   Highest education level: Not on file  Occupational History   Not on file  Tobacco Use   Smoking status: Former    Packs/day: 0.30    Years: 10.00    Additional pack years: 0.00    Total pack years: 3.00    Types: Cigarettes    Start date: 08/06/1958    Quit date: 1968    Years since quitting: 56.4   Smokeless tobacco: Never  Vaping Use   Vaping Use: Never used  Substance and Sexual Activity   Alcohol use: Not Currently   Drug use: Never   Sexual activity: Yes  Other Topics Concern   Not on file  Social History Narrative   Not on file   Social Determinants of Health   Financial Resource Strain: Not  on file  Food Insecurity: Not on file  Transportation Needs: Not on file  Physical Activity: Not on file  Stress: Not on file  Social Connections: Not on file  Intimate Partner Violence: Not on file    Family History: Family History  Problem Relation Age of Onset   CAD Mother        MI in 34s   Prostate cancer Father    Prostate cancer Brother    Lung cancer Brother     Current Medications:  Current Outpatient Medications:    allopurinol (ZYLOPRIM) 300 MG tablet, Take 450 mg by mouth in the morning., Disp: , Rfl:    aluminum-magnesium hydroxide 200-200 MG/5ML suspension, Take 5 mLs by mouth every 6 (six) hours as needed for indigestion., Disp: , Rfl:    apixaban (ELIQUIS) 5 MG TABS tablet, Take 1 tablet (5 mg total) by mouth 2 (two) times daily for 2 days., Disp: 60 tablet, Rfl: 6   Ascorbic Acid (VITAMIN C) 1000 MG tablet, Take 1,000 mg by mouth 2 (two) times daily., Disp: , Rfl:    atorvastatin (LIPITOR) 80 MG tablet, Take 1 tablet (80 mg total) by mouth daily., Disp: 90 tablet, Rfl: 3   benazepril (LOTENSIN) 40 MG tablet, Take 1 tablet (40 mg total) by mouth daily., Disp: 30 tablet, Rfl: 3   cyanocobalamin 1000 MCG tablet, Take 1 tablet (1,000 mcg total) by mouth daily., Disp: , Rfl:    diltiazem (CARDIZEM CD) 240 MG 24 hr capsule, Take 240 mg by mouth daily., Disp: , Rfl:    dorzolamide (TRUSOPT) 2 % ophthalmic solution, Place 1 drop into both eyes 2 (two) times daily., Disp: , Rfl:    FERREX 150 150 MG capsule, Take 1 capsule (150 mg total) by mouth 2 (two) times daily., Disp: 60 capsule, Rfl: 5   hydrALAZINE (APRESOLINE) 100 MG tablet, Take 1 tablet (100 mg total) by mouth 3 (three) times daily., Disp: 90 tablet, Rfl: 3   latanoprost (XALATAN) 0.005 % ophthalmic solution, Place 1 drop into both eyes at bedtime., Disp: , Rfl:    loperamide (IMODIUM) 2 MG capsule, Take 2 mg by mouth as needed for  diarrhea or loose stools., Disp: , Rfl:    metFORMIN (GLUCOPHAGE) 500 MG tablet,  Take 1 tablet (500 mg total) by mouth 2 (two) times daily., Disp: 60 tablet, Rfl: 4   metoprolol succinate (TOPROL-XL) 25 MG 24 hr tablet, Take 1 tablet (25 mg total) by mouth daily., Disp: 30 tablet, Rfl: 3   Multiple Vitamin (MULTIVITAMIN) tablet, Take 1 tablet by mouth in the morning., Disp: , Rfl:    ondansetron (ZOFRAN) 4 MG tablet, Take 1 tablet (4 mg total) by mouth daily as needed for nausea or vomiting., Disp: 30 tablet, Rfl: 1   ONETOUCH ULTRA test strip, USE 1 STRIP TO CHECK GLUCOSE ONCE DAILY, Disp: , Rfl:    pantoprazole (PROTONIX) 40 MG tablet, Take 1 tablet (40 mg total) by mouth daily., Disp: 90 tablet, Rfl: 1   prochlorperazine (COMPAZINE) 10 MG tablet, Take 1 tablet (10 mg total) by mouth every 6 (six) hours as needed for nausea or vomiting., Disp: 60 tablet, Rfl: 3   spironolactone (ALDACTONE) 25 MG tablet, Take 0.5 tablets (12.5 mg total) by mouth daily., Disp: 15 tablet, Rfl: 6 No current facility-administered medications for this visit.  Facility-Administered Medications Ordered in Other Visits:    lactated ringers infusion, , Intravenous, Continuous PRN, Vanessa [place], CRNA, Stopped at 05/11/22 1355   Allergies: Allergies  Allergen Reactions   Amiodarone Rash    REVIEW OF SYSTEMS:   Review of Systems  Constitutional:  Negative for chills, fatigue and fever.  HENT:   Negative for lump/mass, mouth sores, nosebleeds, sore throat and trouble swallowing.   Eyes:  Negative for eye problems.  Respiratory:  Positive for cough. Negative for shortness of breath.   Cardiovascular:  Negative for chest pain, leg swelling and palpitations.  Gastrointestinal:  Negative for abdominal pain, constipation, diarrhea, nausea and vomiting.  Genitourinary:  Negative for bladder incontinence, difficulty urinating, dysuria, frequency, hematuria and nocturia.   Musculoskeletal:  Negative for arthralgias, back pain, flank pain, myalgias and neck pain.  Skin:  Negative for itching  and rash.  Neurological:  Positive for dizziness. Negative for headaches and numbness.  Hematological:  Does not bruise/bleed easily.  Psychiatric/Behavioral:  Negative for depression, sleep disturbance and suicidal ideas. The patient is not nervous/anxious.   All other systems reviewed and are negative.    VITALS:   Blood pressure (!) 111/54, pulse (!) 58, temperature 98 F (36.7 C), temperature source Oral, resp. rate 18, weight 211 lb 9.6 oz (96 kg), SpO2 97 %.  Wt Readings from Last 3 Encounters:  08/23/22 211 lb 9.6 oz (96 kg)  08/22/22 211 lb (95.7 kg)  07/26/22 205 lb 11 oz (93.3 kg)    Body mass index is 29.51 kg/m.  Performance status (ECOG): 1 - Symptomatic but completely ambulatory  PHYSICAL EXAM:   Physical Exam Vitals and nursing note reviewed. Exam conducted with a chaperone present.  Constitutional:      Appearance: Normal appearance.  Cardiovascular:     Rate and Rhythm: Normal rate and regular rhythm.     Pulses: Normal pulses.     Heart sounds: Normal heart sounds.  Pulmonary:     Effort: Pulmonary effort is normal.     Breath sounds: Normal breath sounds.  Abdominal:     Palpations: Abdomen is soft. There is no hepatomegaly, splenomegaly or mass.     Tenderness: There is no abdominal tenderness.  Musculoskeletal:     Right lower leg: No edema.     Left lower leg: No  edema.  Lymphadenopathy:     Cervical: No cervical adenopathy.     Right cervical: No superficial, deep or posterior cervical adenopathy.    Left cervical: No superficial, deep or posterior cervical adenopathy.     Upper Body:     Right upper body: No supraclavicular or axillary adenopathy.     Left upper body: No supraclavicular or axillary adenopathy.  Neurological:     General: No focal deficit present.     Mental Status: He is alert and oriented to person, place, and time.  Psychiatric:        Mood and Affect: Mood normal.        Behavior: Behavior normal.     LABS:       Latest Ref Rng & Units 08/16/2022    2:34 PM 07/29/2022    4:42 AM 07/28/2022    5:19 AM  CBC  WBC 4.0 - 10.5 K/uL 7.0  9.8  4.4   Hemoglobin 13.0 - 17.0 g/dL 40.9  81.1  91.4   Hematocrit 39.0 - 52.0 % 38.0  35.1  34.1   Platelets 150 - 400 K/uL 178  140  131       Latest Ref Rng & Units 08/16/2022    2:34 PM 07/29/2022    4:42 AM 07/28/2022    5:19 AM  CMP  Glucose 70 - 99 mg/dL 782  956  213   BUN 8 - 23 mg/dL 28  18  11    Creatinine 0.61 - 1.24 mg/dL 0.86  5.78  4.69   Sodium 135 - 145 mmol/L 138  134  137   Potassium 3.5 - 5.1 mmol/L 4.5  4.4  3.7   Chloride 98 - 111 mmol/L 104  104  108   CO2 22 - 32 mmol/L 23  24  22    Calcium 8.9 - 10.3 mg/dL 9.9  9.1  8.9   Total Protein 6.5 - 8.1 g/dL 7.0  6.0  5.8   Total Bilirubin 0.3 - 1.2 mg/dL 0.5  0.6  0.8   Alkaline Phos 38 - 126 U/L 84  132  140   AST 15 - 41 U/L 26  78  127   ALT 0 - 44 U/L 25  223  281      Lab Results  Component Value Date   CEA1 5.3 (H) 08/16/2022   CEA 1.8 03/07/2007   /  CEA  Date Value Ref Range Status  08/16/2022 5.3 (H) 0.0 - 4.7 ng/mL Final    Comment:    (NOTE)                             Nonsmokers          <3.9                             Smokers             <5.6 [employer] Electrochemiluminescence Immunoassay (ECLIA) Values obtained with different assay methods or kits cannot be used interchangeably.  Results cannot be interpreted as absolute evidence of the presence or absence of malignant disease. Performed At: [facility] [address] [place], [place] [id number] Jolene Schimke MD KG:[id number]   03/07/2007 1.8  Final   No results found for: "PSA1" No results found for: "UYQ034" No results found for: "VQQ595"  Lab Results  Component Value Date  TOTALPROTELP 6.3 03/08/2021   ALBUMINELP 3.8 03/08/2021   A1GS 0.2 03/08/2021   A2GS 0.7 03/08/2021   BETS 0.8 03/08/2021   GAMS 0.8 03/08/2021   MSPIKE Not Observed 03/08/2021   SPEI Comment 03/08/2021   Lab  Results  Component Value Date   TIBC 322 08/16/2022   TIBC 322 05/15/2022   TIBC 314 01/30/2022   FERRITIN 48 08/16/2022   FERRITIN 32 05/15/2022   FERRITIN 84 01/30/2022   IRONPCTSAT 14 (L) 08/16/2022   IRONPCTSAT 28 05/15/2022   IRONPCTSAT 17 (L) 01/30/2022   Lab Results  Component Value Date   LDH 169 06/19/2007     STUDIES:   CT Abdomen Pelvis W Contrast  Result Date: 08/21/2022 CLINICAL DATA:  Follow-up metastatic colon carcinoma. Surveillance. * Tracking Code: BO * EXAM: CT ABDOMEN AND PELVIS WITH CONTRAST TECHNIQUE: Multidetector CT imaging of the abdomen and pelvis was performed using the standard protocol following bolus administration of intravenous contrast. RADIATION DOSE REDUCTION: This exam was performed according to the departmental dose-optimization program which includes automated exposure control, adjustment of the mA and/or kV according to patient size and/or use of iterative reconstruction technique. CONTRAST:  OMNIPAQUE IOHEXOL 300 MG/ML  SOLN COMPARISON:  MRI on 07/26/2022 and CT on 05/15/2022 FINDINGS: Lower Chest: No acute findings. Hepatobiliary: Numerous small hepatic cysts and few small benign hemangiomas remains stable. No new or enlarging hepatic masses identified. Prior cholecystectomy. No evidence of biliary obstruction. Pancreas: 14 mm simple appearing cystic lesion in the pancreatic body remains stable. Spleen: Within normal limits in size and appearance. Adrenals/Urinary Tract: No suspicious masses identified. No evidence of ureteral calculi or hydronephrosis. Stomach/Bowel: Stable postop changes from previous right colectomy. No mass identified. No evidence of obstruction, inflammatory process or abnormal fluid collections. Vascular/Lymphatic: 11 mm right common iliac lymph node remains stable. Shotty mesenteric lymphadenopathy is again seen. 11 mm right lower quadrant mesenteric lymph node remains stable, however mesenteric lymph node in the central  pelvis currently measures 11 mm on image 58/2, compared to 8 mm previously. No acute vascular findings. Aortic atherosclerotic calcification incidentally noted. Reproductive:  Prostate brachytherapy seeds again noted. Other:  None. Musculoskeletal:  No suspicious bone lesions identified. IMPRESSION: Mild increase in size of 11 mm mesenteric lymph node in the central pelvis. Other mild mesenteric and right common iliac lymphadenopathy remains stable. Stable 14 mm simple appearing cystic lesion in pancreatic body, likely indolent side-branch IPMN. Given patient's comorbidities, continued attention on follow-up imaging is recommended. Aortic Atherosclerosis (ICD10-I70.0). Electronically Signed   By: Danae Orleans M.D.   On: 08/21/2022 12:22   MR ABDOMEN MRCP W WO CONTAST  Result Date: 07/26/2022 CLINICAL DATA:  Jaundice. Gallbladder wall thickening and edema. History of colon cancer. EXAM: MRI ABDOMEN WITHOUT AND WITH CONTRAST (INCLUDING MRCP) TECHNIQUE: Multiplanar multisequence MR imaging of the abdomen was performed both before and after the administration of intravenous contrast. Heavily T2-weighted images of the biliary and pancreatic ducts were obtained, and three-dimensional MRCP images were rendered by post processing. CONTRAST:  7mL GADAVIST GADOBUTROL 1 MMOL/ML IV SOLN COMPARISON:  None Available. FINDINGS: Lower chest: Trace pleural fluid. There is a small pericardial effusion with a more prominent component towards the left side of the heart. Heart is slightly enlarged. Hepatobiliary: The liver of several small bright T2, low T1 nonenhancing foci consistent with benign cysts. Many are under a cm and too small to completely characterize but are nonaggressive and likely benign cystic lesions or hamartomas. There is one lesion which is  bright on T2 with progressive nodular enhancement in segment 4 consistent with a hemangioma. On series 25, image 53 this measures 2.4 x 1.4 cm. Second smaller hemangioma best  seen on series 25, image 48 in segment 7. Patent portal vein. No intrahepatic biliary ductal dilatation. The common duct has a diameter approaching 3-4 mm. Normal tapering towards the pancreatic head The gallbladder is mildly distended. There is wall thickening as well as adjacent edema. Multiple stone seen towards the neck of the gallbladder. Question stone towards the cystic duct as well acute cholecystitis is possible. Pancreas: Global atrophy of the pancreas. No obvious mass. Slightly low T1 signal. Please correlate for any previous history of pancreatitis remotely. Simple appearing cyst once again in the body/neck of the pancreas measuring 13 mm. Spleen: Within normal limits in size and appearance. Adrenals/Urinary Tract: Adrenal glands are preserved. Kidneys are without enhancing mass. No collecting system dilatation. There is perinephric stranding. Small subcentimeter nonenhancing benign-appearing renal cysts are identified. No specific imaging follow-up. Stomach/Bowel: Stomach is nondilated. Moderate colonic stool. Small bowel is nondilated. Vascular/Lymphatic: Normal caliber aorta and IVC with some atherosclerotic changes. No specific abnormal lymph node enlargement seen in the visualized abdomen. Other: No ascites identified in the visualized abdomen. Motion artifact. Musculoskeletal: Scattered degenerative changes. IMPRESSION: Distended gallbladder with wall edema, thickening and adjacent fluid. Several stones identified towards the neck region and there is a possible stone towards the cystic duct. Please correlate for acute cholecystitis. No biliary ductal dilatation. Multiple hepatic and renal benign-appearing cysts. There are 2 lesions in the liver elsewhere consistent with hemangiomas. Benign-appearing 13 mm pancreatic cyst. Recommend follow up imaging in 2 years. Motion artifact. Electronically Signed   By: Karen Kays M.D.   On: 07/26/2022 13:44   MR 3D Recon At Scanner  Result Date:  07/26/2022 CLINICAL DATA:  Jaundice. Gallbladder wall thickening and edema. History of colon cancer. EXAM: MRI ABDOMEN WITHOUT AND WITH CONTRAST (INCLUDING MRCP) TECHNIQUE: Multiplanar multisequence MR imaging of the abdomen was performed both before and after the administration of intravenous contrast. Heavily T2-weighted images of the biliary and pancreatic ducts were obtained, and three-dimensional MRCP images were rendered by post processing. CONTRAST:  7mL GADAVIST GADOBUTROL 1 MMOL/ML IV SOLN COMPARISON:  None Available. FINDINGS: Lower chest: Trace pleural fluid. There is a small pericardial effusion with a more prominent component towards the left side of the heart. Heart is slightly enlarged. Hepatobiliary: The liver of several small bright T2, low T1 nonenhancing foci consistent with benign cysts. Many are under a cm and too small to completely characterize but are nonaggressive and likely benign cystic lesions or hamartomas. There is one lesion which is bright on T2 with progressive nodular enhancement in segment 4 consistent with a hemangioma. On series 25, image 53 this measures 2.4 x 1.4 cm. Second smaller hemangioma best seen on series 25, image 48 in segment 7. Patent portal vein. No intrahepatic biliary ductal dilatation. The common duct has a diameter approaching 3-4 mm. Normal tapering towards the pancreatic head The gallbladder is mildly distended. There is wall thickening as well as adjacent edema. Multiple stone seen towards the neck of the gallbladder. Question stone towards the cystic duct as well acute cholecystitis is possible. Pancreas: Global atrophy of the pancreas. No obvious mass. Slightly low T1 signal. Please correlate for any previous history of pancreatitis remotely. Simple appearing cyst once again in the body/neck of the pancreas measuring 13 mm. Spleen: Within normal limits in size and appearance. Adrenals/Urinary Tract: Adrenal glands  are preserved. Kidneys are without enhancing  mass. No collecting system dilatation. There is perinephric stranding. Small subcentimeter nonenhancing benign-appearing renal cysts are identified. No specific imaging follow-up. Stomach/Bowel: Stomach is nondilated. Moderate colonic stool. Small bowel is nondilated. Vascular/Lymphatic: Normal caliber aorta and IVC with some atherosclerotic changes. No specific abnormal lymph node enlargement seen in the visualized abdomen. Other: No ascites identified in the visualized abdomen. Motion artifact. Musculoskeletal: Scattered degenerative changes. IMPRESSION: Distended gallbladder with wall edema, thickening and adjacent fluid. Several stones identified towards the neck region and there is a possible stone towards the cystic duct. Please correlate for acute cholecystitis. No biliary ductal dilatation. Multiple hepatic and renal benign-appearing cysts. There are 2 lesions in the liver elsewhere consistent with hemangiomas. Benign-appearing 13 mm pancreatic cyst. Recommend follow up imaging in 2 years. Motion artifact. Electronically Signed   By: Karen Kays M.D.   On: 07/26/2022 13:44   ECHOCARDIOGRAM COMPLETE  Result Date: 07/26/2022    ECHOCARDIOGRAM REPORT   Patient Name:   [name] [name] Date of Exam: 07/26/2022 Medical Rec #:  [id number]     Height:       71.0 in Accession #:    [id number]    Weight:       205.7 lb Date of Birth:  Jan 02, 1941     BSA:          2.134 m Patient Age:    81 years      BP:           148/58 mmHg Patient Gender: M             HR:           55 bpm. Exam Location:  [facility] Procedure: 2D Echo, Cardiac Doppler, Color Doppler, 3D Echo and Strain Analysis Indications:    Chest Pain R07.9  History:        Patient has prior history of Echocardiogram examinations, most                 recent 05/21/2021. CAD, Pericardial Disease and Previous                 Myocardial Infarction, Arrythmias:Atrial Fibrillation,                 Signs/Symptoms:Chest Pain; Risk Factors:Dyslipidemia,                  Hypertension, Diabetes and Former Smoker.  Sonographer:    Aron Baba Referring Phys: [id number] ASIA B ZIERLE-GHOSH  Sonographer Comments: Image acquisition challenging due to respiratory motion. Global longitudinal strain was attempted. IMPRESSIONS  1. Left ventricular ejection fraction, by estimation, is 70 to 75%. The left ventricle has hyperdynamic function. The left ventricle has no regional wall motion abnormalities. There is severe left ventricular hypertrophy. Left ventricular diastolic parameters are consistent with Grade I diastolic dysfunction (impaired relaxation). Elevated left atrial pressure. The average left ventricular global longitudinal strain is -20.0 %. The global longitudinal strain is normal.  2. Right ventricular systolic function is normal. The right ventricular size is normal. Tricuspid regurgitation signal is inadequate for assessing PA pressure.  3. Left atrial size was severely dilated.  4. A small pericardial effusion is present. The pericardial effusion is circumferential. There is no evidence of cardiac tamponade.  5. The mitral valve is normal in structure. No evidence of mitral valve regurgitation. No evidence of mitral stenosis.  6. The aortic valve is tricuspid. There is moderate calcification of the aortic valve. There is  moderate thickening of the aortic valve. Aortic valve regurgitation is not visualized. Mild aortic valve stenosis.  7. The inferior vena cava is normal in size with greater than 50% respiratory variability, suggesting right atrial pressure of 3 mmHg. FINDINGS  Left Ventricle: Left ventricular ejection fraction, by estimation, is 70 to 75%. The left ventricle has hyperdynamic function. The left ventricle has no regional wall motion abnormalities. The average left ventricular global longitudinal strain is -20.0  %. The global longitudinal strain is normal. The left ventricular internal cavity size was normal in size. There is severe left ventricular hypertrophy.  Left ventricular diastolic parameters are consistent with Grade I diastolic dysfunction (impaired relaxation). Elevated left atrial pressure. Right Ventricle: The right ventricular size is normal. Right vetricular wall thickness was not well visualized. Right ventricular systolic function is normal. Tricuspid regurgitation signal is inadequate for assessing PA pressure. Left Atrium: Left atrial size was severely dilated. Right Atrium: Right atrial size was normal in size. Pericardium: A small pericardial effusion is present. The pericardial effusion is circumferential. There is no evidence of cardiac tamponade. Mitral Valve: The mitral valve is normal in structure. No evidence of mitral valve regurgitation. No evidence of mitral valve stenosis. Tricuspid Valve: The tricuspid valve is normal in structure. Tricuspid valve regurgitation is trivial. No evidence of tricuspid stenosis. Aortic Valve: The aortic valve is tricuspid. There is moderate calcification of the aortic valve. There is moderate thickening of the aortic valve. There is moderate aortic valve annular calcification. Aortic valve regurgitation is not visualized. Mild aortic stenosis is present. Aortic valve mean gradient measures 11.0 mmHg. Aortic valve peak gradient measures 18.8 mmHg. Aortic valve area, by VTI measures 1.94 cm. Pulmonic Valve: The pulmonic valve was not well visualized. Pulmonic valve regurgitation is mild. No evidence of pulmonic stenosis. Aorta: The aortic root is normal in size and structure. Venous: The inferior vena cava is normal in size with greater than 50% respiratory variability, suggesting right atrial pressure of 3 mmHg. IAS/Shunts: No atrial level shunt detected by color flow Doppler.  LEFT VENTRICLE PLAX 2D LVIDd:         3.60 cm   Diastology LVIDs:         2.10 cm   LV e' medial:    3.59 cm/s LV PW:         1.70 cm   LV E/e' medial:  27.9 LV IVS:        1.70 cm   LV e' lateral:   3.42 cm/s LVOT diam:     2.00 cm   LV  E/e' lateral: 29.2 LV SV:         106 LV SV Index:   50        2D Longitudinal Strain LVOT Area:     3.14 cm  2D Strain GLS Avg:     -20.0 %                           3D Volume EF:                          3D EF:        58 %                          LV EDV:       144 ml  LV ESV:       61 ml                          LV SV:        84 ml RIGHT VENTRICLE RV S prime:     11.20 cm/s TAPSE (M-mode): 1.6 cm LEFT ATRIUM              Index        RIGHT ATRIUM           Index LA diam:        5.00 cm  2.34 cm/m   RA Area:     18.40 cm LA Vol (A2C):   160.0 ml 74.98 ml/m  RA Volume:   38.40 ml  18.00 ml/m LA Vol (A4C):   141.0 ml 66.08 ml/m LA Biplane Vol: 155.0 ml 72.64 ml/m  AORTIC VALVE                     PULMONIC VALVE AV Area (Vmax):    2.01 cm      PR End Diast Vel: 7.29 msec AV Area (Vmean):   1.81 cm AV Area (VTI):     1.94 cm AV Vmax:           217.00 cm/s AV Vmean:          152.500 cm/s AV VTI:            0.550 m AV Peak Grad:      18.8 mmHg AV Mean Grad:      11.0 mmHg LVOT Vmax:         139.00 cm/s LVOT Vmean:        87.900 cm/s LVOT VTI:          0.339 m LVOT/AV VTI ratio: 0.62  AORTA Ao Root diam: 3.70 cm Ao Asc diam:  3.40 cm MITRAL VALVE MV Area (PHT): 1.71 cm     SHUNTS MV Decel Time: 444 msec     Systemic VTI:  0.34 m MR Peak grad: 8.9 mmHg      Systemic Diam: 2.00 cm MR Vmax:      149.00 cm/s MV E velocity: 100.00 cm/s MV A velocity: 143.00 cm/s MV E/A ratio:  0.70 Dina Rich MD Electronically signed by Dina Rich MD Signature Date/Time: 07/26/2022/12:29:25 PM    Final    US Abdomen Limited RUQ (LIVER/GB)  Result Date: 07/26/2022 CLINICAL DATA:  Abnormal liver function tests EXAM: ULTRASOUND ABDOMEN LIMITED RIGHT UPPER QUADRANT COMPARISON:  CT 05/15/2022 FINDINGS: Gallbladder: Distended gallbladder with wall thickening measuring up to 5 mm. Wall edema as well. Possible component of sludge as well. No shadowing stones. There is a rounded area measuring 5 mm within  the gallbladder lumen without shadowing. Possible polyp or tumefactive sludge. Common bile duct: Diameter: 5 mm Liver: Mildly echogenic hepatic parenchyma. Portal vein is patent on color Doppler imaging with normal direction of blood flow towards the liver. Other: None. IMPRESSION: Mild fatty liver infiltration.  No ductal dilatation. There is gallbladder wall thickening and edema with some possible sludge. There is also a nodular focus without shadowing measuring 5 mm. Possible small gallbladder polyp. Further workup as clinically appropriate such as MRI Electronically Signed   By: Karen Kays M.D.   On: 07/26/2022 10:04   DG Chest Port 1 View  Result Date: 07/25/2022 CLINICAL DATA:  Chest pain EXAM: PORTABLE CHEST 1 VIEW COMPARISON:  Chest x-ray 11/19/2021.  CT abdomen and pelvis 05/15/2022. CT chest abdomen and pelvis 11/08/2021. FINDINGS: Heart is mildly enlarged, unchanged. Small nodular density seen in the right costophrenic angle, not seen on prior CT. The lungs are otherwise clear. There is no pleural effusion or pneumothorax. No acute fractures are seen. IMPRESSION: 1. No acute cardiopulmonary process. 2. Small nodular density in the right costophrenic angle, not seen on prior CT. Follow-up chest CT recommended, non emergently. Electronically Signed   By: Darliss Cheney M.D.   On: 07/25/2022 21:26

## 2022-08-22 NOTE — Progress Notes (Signed)
[facility] Note   HPI:  82 y.o. Male presents to clinic for post-op follow-up status post robotic assisted laparoscopic cholecystectomy on 5/3.  He states that he has been doing very well since the surgery.  He is tolerating a diet without nausea and vomiting, moving his bowels without issue.  He denies any pain.  He denies any issues with his incision sites.  Denies fevers and chills.  Review of Systems:  All other review of systems: otherwise negative   Vital Signs:  BP 116/65   Pulse 63   Temp 98 F (36.7 C) (Oral)   Resp 12   Ht 5\' 11"  (1.803 m)   Wt 211 lb (95.7 kg)   SpO2 95%   BMI 29.43 kg/m    Physical Exam:  Physical Exam Vitals reviewed.  Constitutional:      Appearance: Normal appearance.  Abdominal:     Comments: Abdomen soft, nondistended, no palpation: No rigidity, guarding, rebound tenderness; incisions C/D/I with mild ecchymosis at the right lateral incision site  Neurological:     Mental Status: He is alert.     Laboratory studies: None  Imaging:  None  Pathology: A. GALLBLADDER, CHOLECYSTECTOMY:  - Acute on chronic cholecystitis with cholelithiasis   Assessment:  82 y.o. yo Male who presents for follow-up status post robotic assisted laparoscopic cholecystectomy on 5/3  Plan:  -Patient overall doing very well since surgery, denies pain, moving bowels, and tolerating a diet -I advised him of his pathology results -Follow up as needed  All of the above recommendations were discussed with the patient and patient's family, and all of patient's and family's questions were answered to their expressed satisfaction.  Theophilus Kinds, DO [facility] [address] [place], [place] [address] [phone] (office)

## 2022-08-23 ENCOUNTER — Inpatient Hospital Stay: Payer: Medicare HMO | Admitting: Hematology

## 2022-08-23 ENCOUNTER — Encounter: Payer: Self-pay | Admitting: Hematology

## 2022-08-23 VITALS — BP 111/54 | HR 58 | Temp 98.0°F | Resp 18 | Wt 211.6 lb

## 2022-08-23 DIAGNOSIS — D509 Iron deficiency anemia, unspecified: Secondary | ICD-10-CM | POA: Diagnosis not present

## 2022-08-23 DIAGNOSIS — C184 Malignant neoplasm of transverse colon: Secondary | ICD-10-CM

## 2022-08-23 DIAGNOSIS — Z8546 Personal history of malignant neoplasm of prostate: Secondary | ICD-10-CM | POA: Diagnosis not present

## 2022-08-23 DIAGNOSIS — Z87891 Personal history of nicotine dependence: Secondary | ICD-10-CM | POA: Diagnosis not present

## 2022-08-23 DIAGNOSIS — D649 Anemia, unspecified: Secondary | ICD-10-CM | POA: Diagnosis not present

## 2022-08-23 DIAGNOSIS — Z85038 Personal history of other malignant neoplasm of large intestine: Secondary | ICD-10-CM | POA: Diagnosis not present

## 2022-08-23 NOTE — Patient Instructions (Signed)
[facility]  Discharge Instructions  You were seen and examined today by Dr. Ellin Saba.  Dr. Ellin Saba discussed your most recent lab work which revealed that everything looks good.  Follow-up as scheduled in 3 months.   Thank you for choosing [facility] to provide your oncology and hematology care.   To afford each patient quality time with our provider, please arrive at least 15 minutes before your scheduled appointment time. You may need to reschedule your appointment if you arrive late (10 or more minutes). Arriving late affects you and other patients whose appointments are after yours.  Also, if you miss three or more appointments without notifying the office, you may be dismissed from the clinic at the provider's discretion.    Again, thank you for choosing [facility].  Our hope is that these requests will decrease the amount of time that you wait before being seen by our physicians.   If you have a lab appointment with the Cancer Center - please note that after April 8th, all labs will be drawn in the cancer center.  You do not have to check in or register with the main entrance as you have in the past but will complete your check-in at the cancer center.            _____________________________________________________________  Should you have questions after your visit to [facility], please contact our office at [phone] and follow the prompts.  Our office hours are 8:00 a.m. to 4:30 p.m. Monday - Thursday and 8:00 a.m. to 2:30 p.m. Friday.  Please note that voicemails left after 4:00 p.m. may not be returned until the following business day.  We are closed weekends and all major holidays.  You do have access to a nurse 24-7, just call the main number to the clinic [phone] and do not press any options, hold on the line and a nurse will answer the phone.    For prescription refill requests,  have your pharmacy contact our office and allow 72 hours.    Masks are no longer required in the cancer centers. If you would like for your care team to wear a mask while they are taking care of you, please let them know. You may have one support person who is at least 82 years old accompany you for your appointments.

## 2022-08-28 DIAGNOSIS — E1165 Type 2 diabetes mellitus with hyperglycemia: Secondary | ICD-10-CM | POA: Diagnosis not present

## 2022-08-28 DIAGNOSIS — C189 Malignant neoplasm of colon, unspecified: Secondary | ICD-10-CM | POA: Diagnosis not present

## 2022-08-28 DIAGNOSIS — M1000 Idiopathic gout, unspecified site: Secondary | ICD-10-CM | POA: Diagnosis not present

## 2022-08-28 DIAGNOSIS — K2100 Gastro-esophageal reflux disease with esophagitis, without bleeding: Secondary | ICD-10-CM | POA: Diagnosis not present

## 2022-08-28 DIAGNOSIS — I4891 Unspecified atrial fibrillation: Secondary | ICD-10-CM | POA: Diagnosis not present

## 2022-08-30 DIAGNOSIS — M79674 Pain in right toe(s): Secondary | ICD-10-CM | POA: Diagnosis not present

## 2022-08-30 DIAGNOSIS — M79672 Pain in left foot: Secondary | ICD-10-CM | POA: Diagnosis not present

## 2022-08-30 DIAGNOSIS — M79675 Pain in left toe(s): Secondary | ICD-10-CM | POA: Diagnosis not present

## 2022-08-30 DIAGNOSIS — L110 Acquired keratosis follicularis: Secondary | ICD-10-CM | POA: Diagnosis not present

## 2022-08-30 DIAGNOSIS — I739 Peripheral vascular disease, unspecified: Secondary | ICD-10-CM | POA: Diagnosis not present

## 2022-08-30 DIAGNOSIS — M79671 Pain in right foot: Secondary | ICD-10-CM | POA: Diagnosis not present

## 2022-08-30 DIAGNOSIS — E1140 Type 2 diabetes mellitus with diabetic neuropathy, unspecified: Secondary | ICD-10-CM | POA: Diagnosis not present

## 2022-08-31 DIAGNOSIS — H401132 Primary open-angle glaucoma, bilateral, moderate stage: Secondary | ICD-10-CM | POA: Diagnosis not present

## 2022-10-26 DIAGNOSIS — H401112 Primary open-angle glaucoma, right eye, moderate stage: Secondary | ICD-10-CM | POA: Diagnosis not present

## 2022-11-03 DIAGNOSIS — M549 Dorsalgia, unspecified: Secondary | ICD-10-CM | POA: Diagnosis not present

## 2022-11-03 DIAGNOSIS — R030 Elevated blood-pressure reading, without diagnosis of hypertension: Secondary | ICD-10-CM | POA: Diagnosis not present

## 2022-11-03 DIAGNOSIS — Z6828 Body mass index (BMI) 28.0-28.9, adult: Secondary | ICD-10-CM | POA: Diagnosis not present

## 2022-11-09 DIAGNOSIS — E1140 Type 2 diabetes mellitus with diabetic neuropathy, unspecified: Secondary | ICD-10-CM | POA: Diagnosis not present

## 2022-11-09 DIAGNOSIS — M79672 Pain in left foot: Secondary | ICD-10-CM | POA: Diagnosis not present

## 2022-11-09 DIAGNOSIS — M79675 Pain in left toe(s): Secondary | ICD-10-CM | POA: Diagnosis not present

## 2022-11-09 DIAGNOSIS — I739 Peripheral vascular disease, unspecified: Secondary | ICD-10-CM | POA: Diagnosis not present

## 2022-11-09 DIAGNOSIS — L110 Acquired keratosis follicularis: Secondary | ICD-10-CM | POA: Diagnosis not present

## 2022-11-09 DIAGNOSIS — M79674 Pain in right toe(s): Secondary | ICD-10-CM | POA: Diagnosis not present

## 2022-11-09 DIAGNOSIS — M79671 Pain in right foot: Secondary | ICD-10-CM | POA: Diagnosis not present

## 2022-11-16 ENCOUNTER — Inpatient Hospital Stay: Payer: Medicare HMO | Attending: Hematology

## 2022-11-16 DIAGNOSIS — Z85038 Personal history of other malignant neoplasm of large intestine: Secondary | ICD-10-CM | POA: Diagnosis not present

## 2022-11-16 DIAGNOSIS — Z8546 Personal history of malignant neoplasm of prostate: Secondary | ICD-10-CM | POA: Insufficient documentation

## 2022-11-16 DIAGNOSIS — D509 Iron deficiency anemia, unspecified: Secondary | ICD-10-CM

## 2022-11-16 DIAGNOSIS — Z8639 Personal history of other endocrine, nutritional and metabolic disease: Secondary | ICD-10-CM | POA: Insufficient documentation

## 2022-11-16 DIAGNOSIS — C184 Malignant neoplasm of transverse colon: Secondary | ICD-10-CM

## 2022-11-16 LAB — COMPREHENSIVE METABOLIC PANEL
ALT: 20 U/L (ref 0–44)
AST: 26 U/L (ref 15–41)
Albumin: 4 g/dL (ref 3.5–5.0)
Alkaline Phosphatase: 72 U/L (ref 38–126)
Anion gap: 8 (ref 5–15)
BUN: 22 mg/dL (ref 8–23)
CO2: 23 mmol/L (ref 22–32)
Calcium: 9.1 mg/dL (ref 8.9–10.3)
Chloride: 105 mmol/L (ref 98–111)
Creatinine, Ser: 1.27 mg/dL — ABNORMAL HIGH (ref 0.61–1.24)
GFR, Estimated: 56 mL/min — ABNORMAL LOW (ref >60)
Glucose, Bld: 126 mg/dL — ABNORMAL HIGH (ref 70–99)
Potassium: 4.5 mmol/L (ref 3.5–5.1)
Sodium: 136 mmol/L (ref 135–145)
Total Bilirubin: 0.5 mg/dL (ref 0.3–1.2)
Total Protein: 6.5 g/dL (ref 6.5–8.1)

## 2022-11-16 LAB — CBC
HCT: 36.4 % — ABNORMAL LOW (ref 39.0–52.0)
Hemoglobin: 11.9 g/dL — ABNORMAL LOW (ref 13.0–17.0)
MCH: 31.9 pg (ref 26.0–34.0)
MCHC: 32.7 g/dL (ref 30.0–36.0)
MCV: 97.6 fL (ref 80.0–100.0)
Platelets: 145 10*3/uL — ABNORMAL LOW (ref 150–400)
RBC: 3.73 MIL/uL — ABNORMAL LOW (ref 4.22–5.81)
RDW: 13.9 % (ref 11.5–15.5)
WBC: 6.6 10*3/uL (ref 4.0–10.5)
nRBC: 0 % (ref 0.0–0.2)

## 2022-11-16 LAB — IRON AND TIBC
Iron: 53 ug/dL (ref 45–182)
Saturation Ratios: 18 % (ref 17.9–39.5)
TIBC: 297 ug/dL (ref 250–450)
UIBC: 244 ug/dL

## 2022-11-16 LAB — FERRITIN: Ferritin: 35 ng/mL (ref 24–336)

## 2022-11-17 LAB — CEA: CEA: 6.2 ng/mL — ABNORMAL HIGH (ref 0.0–4.7)

## 2022-11-22 NOTE — Progress Notes (Signed)
[facility] [address] [place] [address]    Clinic Day:  12/12/2022  Referring physician: Richardean Chimera, MD  [name] [name] Care Team: Richardean Chimera, MD as PCP - General (Unknown Physician Specialty) Jonelle Sidle, MD as PCP - Cardiology (Cardiology) Doreatha Massed, MD as Medical Oncologist (Medical Oncology)   ASSESSMENT & PLAN:   Assessment: 1. Colon cancer: - Colonoscopy on 10/12/2020 with 7 sessile polyps found in the transverse colon and cecum.  12 mm polyp found in the transverse colon, sessile.  Polyp was removed with piecemeal technique using cold snare.  2 sessile polyps found in the sigmoid colon, removed with cold snare. - Pathology cecal tubular adenoma, sessile serrated polyp of the transverse colon polypectomy, adenocarcinoma arising from a tubular adenoma with high-grade dysplasia of the transverse colon polypectomy.  Sigmoid colon polypectomy showed tubular adenoma, hyperplastic polyp. - Colonoscopy on 02/25/2021 with three 3 to 5 mm polyps in the transverse colon, removed with cold snare.  One 10 mm polyp in the descending colon at 54 cm proximal to the anus. - Pathology on 02/25/2021 shows invasive adenocarcinoma, poorly differentiated, grade 3 of the descending colon polypectomy.  CDX2 positive.  Other findings include transverse colon tubular adenoma and sessile serrated adenoma. - Right hemicolectomy on 05/20/2021. - Pathology shows grade 3 adenocarcinoma, mucinous features, 1.7 cm in the transverse colon, metastatic adenocarcinoma involving 2/29 lymph nodes.  No LVI/perineural invasion.  pT1, PN 1B. - Adjuvant Xeloda 1500 mg 2 weeks on/1 week off started on 07/06/2021.  Cycle 2 on 07/27/2021, 3 tablets twice daily.  Cycle 3 on 08/26/2021, 2 tablets twice daily 2 weeks on/1 week off, dose reduced secondary to HFSR.  Cycle 4 on 09/16/2021.  Cycle 5 Xeloda 1000 mg twice daily on 10/08/2021.  Completed cycle 8 on 01/30/2022.  2. Social/family  history: [name] [name] lives at home with [name] [name] wife.  [name] [name] quit smoking 23 years ago.  [name] [name] smoked 1 pack/day for 7 years.  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  [name] [name] is active and walks 2 miles per day. - Father had prostate cancer.  Brother had lung cancer.  Another brother had colon cancer (? colon), Sister with lung cancer, another sister with cancer, [name] [name] unknown.  3.  Prostate cancer: - [name] [name] was diagnosed with prostate cancer in August 2007, status post seed implants.  [name] [name] PSA has been undetectable since then.    Plan: 1. Stage III (T1N1B) transverse colon adenocarcinoma: - Denies any change in bowel habits.  No bleeding per rectum or melena. - Labs from 11/17/2022: Normal LFTs.  CEA is 6.2. - Recommend follow-up in 3 months with CTAP with contrast and CEA level.  2.  Normocytic anemia: - Hemoglobin is 11.9.  Ferritin is 35 and percent saturation of 18. - Continue iron tablet twice daily.  Will plan on repeating ferritin and iron panel in 3 months.   Orders Placed This Encounter  Procedures   CT ABDOMEN PELVIS W CONTRAST    Standing Status:   Future    Standing Expiration Date:   11/23/2023    Order Specific Question:   If indicated for the ordered procedure, I authorize the administration of contrast media per Radiology protocol    Answer:   Yes    Order Specific Question:   Does the [name] [name] have a contrast media/X-ray dye allergy?    Answer:   No    Order Specific Question:   Preferred imaging location?    Answer:  [facility]    Order Specific Question:   If indicated for the ordered procedure, I authorize the administration of oral contrast media per Radiology protocol    Answer:   Yes   CBC with Differential    Standing Status:   Future    Standing Expiration Date:   11/23/2023   Comprehensive metabolic panel    Standing Status:   Future    Standing Expiration Date:   11/23/2023   Magnesium    Standing Status:   Future    Standing Expiration Date:   11/23/2023   CEA     Standing Status:   Future    Standing Expiration Date:   11/23/2023   Iron and TIBC ([facility] ONLY)    Standing Status:   Future    Standing Expiration Date:   11/23/2023   Ferritin    Standing Status:   Future    Standing Expiration Date:   11/23/2023      Mikeal Hawthorne R Teague,acting as a scribe for Doreatha Massed, MD.,have documented all relevant documentation on the behalf of Doreatha Massed, MD,as directed by  Doreatha Massed, MD while in the presence of Doreatha Massed, MD.  I, Doreatha Massed MD, have reviewed the above documentation for accuracy and completeness, and I agree with the above.    Doreatha Massed, MD   9/17/20246:15 PM  CHIEF COMPLAINT:   Diagnosis: colon cancer    Cancer Staging  Colon cancer [facility]) Staging form: Colon and Rectum, AJCC 8th Edition - Clinical stage from 03/22/2021: Stage I (cT1, cN0, cM0) - Unsigned - Pathologic stage from 06/23/2021: Stage IIIA (pT1, pN1b, cM0) - Unsigned    Prior Therapy: 1. Right hemicolectomy on 05/20/21 2. Adjuvant Xeloda 07/07/22 - 01/30/22  Current Therapy:  surveillance   HISTORY OF PRESENT ILLNESS:   Oncology History   No history exists.     INTERVAL HISTORY:   [name] [name] is a 82 y.o. male presenting to clinic today for follow up of colon cancer. [name] [name] was last seen by me on 08/23/22.  Today, [name] [name] states that [name] [name] is doing well overall. [name] [name] appetite level is at 100%. [name] [name] energy level is at 100%. [name] [name] is accompanied by [name] [name] wife.  [name] [name] reports a normal appetite. [name] [name] has not had any changes in BM's and denies hematuria. [name] [name] notes iron pills gives him dark stools and is taking them 2x a day. [name] [name] wife notes [name] [name] has diarrhea and not constipation.   PAST MEDICAL HISTORY:   Past Medical History: Past Medical History:  Diagnosis Date   Anemia    Arthritis    CAD (coronary artery disease)    DES to circumflex 12/2017   Colon cancer (HCC) 2023   Diabetes mellitus without complication (HCC)     Dysrhythmia    GERD (gastroesophageal reflux disease)    Glaucoma    Gout    Headache    History of kidney stones    Hypertension    NSTEMI (non-ST elevated myocardial infarction) (HCC) 12/31/2017   Paroxysmal atrial fibrillation (HCC)    Pericardial effusion    Idiopathic, recurrent pericardial effusion s/p pericardial window.   Prostate cancer (HCC) 2007   S/P Seed implants    Supraventricular tachycardia    Temporal arteritis [facility])     Surgical History: Past Surgical History:  Procedure Laterality Date   BIOPSY  10/12/2020   Procedure: BIOPSY;  Surgeon: Dolores Frame, MD;  [facility];  Service: Gastroenterology;;   BIOPSY  02/25/2021  Procedure: BIOPSY;  Surgeon: Dolores Frame, MD;  [facility];  Service: Gastroenterology;;   BIOPSY  04/11/2021   Procedure: BIOPSY;  Surgeon: Lemar Lofty., MD;  Location: [facility];  Service: Gastroenterology;;   CATARACT EXTRACTION W/PHACO Left 06/07/2015   Procedure: CATARACT EXTRACTION PHACO AND INTRAOCULAR LENS PLACEMENT LEFT EYE CDE=8.00;  Surgeon: Gemma Payor, MD;  Location: [facility];  Service: Ophthalmology;  Laterality: Left;   CATARACT EXTRACTION W/PHACO Right 06/17/2015   Procedure: CATARACT EXTRACTION PHACO AND INTRAOCULAR LENS PLACEMENT RIGHT EYE CDE=11.09;  Surgeon: Gemma Payor, MD;  Location: [facility];  Service: Ophthalmology;  Laterality: Right;   COLONOSCOPY WITH PROPOFOL N/A 10/12/2020   Procedure: COLONOSCOPY WITH PROPOFOL;  Surgeon: Dolores Frame, MD;  [facility];  Service: Gastroenterology;  Laterality: N/A;  10:35   COLONOSCOPY WITH PROPOFOL N/A 02/25/2021   Procedure: COLONOSCOPY WITH PROPOFOL;  Surgeon: Dolores Frame, MD;  [facility];  Service: Gastroenterology;  Laterality: N/A;  8:10   COLONOSCOPY WITH PROPOFOL N/A 07/24/2022   Procedure: COLONOSCOPY WITH PROPOFOL;  Surgeon: Meridee Score Netty Starring., MD;  [facility]: [facility];  Service: Gastroenterology;  Laterality: N/A;   CORONARY ANGIOPLASTY WITH STENT PLACEMENT  01/01/2018   CORONARY STENT INTERVENTION N/A 01/01/2018   Procedure: CORONARY STENT INTERVENTION;  Surgeon: Corky Crafts, MD;  [facility];  Service: Cardiovascular;  Laterality: N/A;   ENDOSCOPIC MUCOSAL RESECTION N/A 04/11/2021   Procedure: ENDOSCOPIC MUCOSAL RESECTION;  Surgeon: Meridee Score Netty Starring., MD;  [facility]: [facility];  Service: Gastroenterology;  Laterality: N/A;   ENTEROSCOPY N/A 10/12/2020   Procedure: PUSH ENTEROSCOPY;  Surgeon: Dolores Frame, MD;  [facility];  Service: Gastroenterology;  Laterality: N/A;   ENTEROSCOPY N/A 04/11/2021   Procedure: ENTEROSCOPY;  Surgeon: Meridee Score Netty Starring., MD;  Location: [facility];  Service: Gastroenterology;  Laterality: N/A;   ENTEROSCOPY N/A 07/24/2022   Procedure: ENTEROSCOPY;  Surgeon: Meridee Score Netty Starring., MD;  Location: [facility];  Service: Gastroenterology;  Laterality: N/A;   ESOPHAGOGASTRODUODENOSCOPY (EGD) WITH PROPOFOL N/A 10/12/2020   Procedure: ESOPHAGOGASTRODUODENOSCOPY (EGD) WITH PROPOFOL;  Surgeon: Dolores Frame, MD;  [facility];  Service: Gastroenterology;  Laterality: N/A;   FRACTURE SURGERY Left 2010   ankle   HEMOSTASIS CLIP PLACEMENT  04/11/2021   Procedure: HEMOSTASIS CLIP PLACEMENT;  Surgeon: Lemar Lofty., MD;  [facility];  Service: Gastroenterology;;   INSERTION PROSTATE RADIATION SEED  2007   KNEE ARTHROSCOPY Left    LAPAROSCOPY N/A 05/22/2021   Procedure: LAPAROSCOPY DIAGNOSTIC WITH WASHOUT OF ABDOMEN;  Surgeon: Romie Levee, MD;  Location: [facility];  Service: General;  Laterality: N/A;   LEFT HEART CATH AND CORONARY ANGIOGRAPHY N/A 01/01/2018   Procedure: LEFT HEART CATH AND CORONARY ANGIOGRAPHY;  Surgeon: Corky Crafts, MD;  [facility];  Service: Cardiovascular;  Laterality: N/A;    ORIF TIBIA & FIBULA FRACTURES Left 2003   Distal tibial/fibula    PERICARDIAL WINDOW  02/2007   PERICARDIOCENTESIS  2007   hx/notes 10/19/2011   POLYPECTOMY  10/12/2020   Procedure: POLYPECTOMY;  Surgeon: Dolores Frame, MD;  [facility];  Service: Gastroenterology;;  small bowel, cecal   POLYPECTOMY  02/25/2021   Procedure: POLYPECTOMY;  Surgeon: Dolores Frame, MD;  [facility];  Service: Gastroenterology;;  transverse colon x2   POLYPECTOMY  07/24/2022   Procedure: POLYPECTOMY;  Surgeon: Lemar Lofty., MD;  Location: [facility];  Service:  Gastroenterology;;   SUBMUCOSAL LIFTING INJECTION  04/11/2021   Procedure: SUBMUCOSAL LIFTING INJECTION;  Surgeon: Lemar Lofty., MD;  Location: [facility];  Service: Gastroenterology;;   SUBMUCOSAL TATTOO INJECTION  04/11/2021   Procedure: SUBMUCOSAL TATTOO INJECTION;  Surgeon: Lemar Lofty., MD;  Location: [facility];  Service: Gastroenterology;;    Social History: Social History   Socioeconomic History   Marital status: Married    Spouse name: Not on file   Number of children: Not on file   Years of education: Not on file   Highest education level: Not on file  Occupational History   Not on file  Tobacco Use   Smoking status: Former    Current packs/day: 0.00    Average packs/day: 0.3 packs/day for 10.0 years (3.0 ttl pk-yrs)    Types: Cigarettes    Start date: 08/06/1958    Quit date: 1968    Years since quitting: 56.7   Smokeless tobacco: Never  Vaping Use   Vaping status: Never Used  Substance and Sexual Activity   Alcohol use: Not Currently   Drug use: Never   Sexual activity: Yes  Other Topics Concern   Not on file  Social History Narrative   Not on file   Social Determinants of Health   Financial Resource Strain: Not on file  Food Insecurity: Not on file  Transportation Needs: Not on file  Physical Activity: Not on file  Stress: Not on file   Social Connections: Not on file  Intimate Partner Violence: Not on file    Family History: Family History  Problem Relation Age of Onset   CAD Mother        MI in 39s   Prostate cancer Father    Prostate cancer Brother    Lung cancer Brother     Current Medications:  Current Outpatient Medications:    allopurinol (ZYLOPRIM) 300 MG tablet, Take 450 mg by mouth in the morning., Disp: , Rfl:    aluminum-magnesium hydroxide 200-200 MG/5ML suspension, Take 5 mLs by mouth every 6 (six) hours as needed for indigestion., Disp: , Rfl:    Ascorbic Acid (VITAMIN C) 1000 MG tablet, Take 1,000 mg by mouth 2 (two) times daily., Disp: , Rfl:    atorvastatin (LIPITOR) 80 MG tablet, Take 1 tablet (80 mg total) by mouth daily., Disp: 90 tablet, Rfl: 3   benazepril (LOTENSIN) 40 MG tablet, Take 1 tablet (40 mg total) by mouth daily., Disp: 30 tablet, Rfl: 3   brimonidine (ALPHAGAN) 0.2 % ophthalmic solution, 1 drop 2 (two) times daily., Disp: , Rfl:    cyanocobalamin 1000 MCG tablet, Take 1 tablet (1,000 mcg total) by mouth daily., Disp: , Rfl:    diltiazem (CARDIZEM CD) 240 MG 24 hr capsule, Take 240 mg by mouth daily., Disp: , Rfl:    dorzolamide (TRUSOPT) 2 % ophthalmic solution, Place 1 drop into both eyes 2 (two) times daily., Disp: , Rfl:    FERREX 150 150 MG capsule, Take 1 capsule (150 mg total) by mouth 2 (two) times daily., Disp: 60 capsule, Rfl: 5   hydrALAZINE (APRESOLINE) 100 MG tablet, Take 1 tablet (100 mg total) by mouth 3 (three) times daily., Disp: 90 tablet, Rfl: 3   loperamide (IMODIUM) 2 MG capsule, Take 2 mg by mouth as needed for diarrhea or loose stools., Disp: , Rfl:    metFORMIN (GLUCOPHAGE) 500 MG tablet, Take 1 tablet (500 mg total) by mouth 2 (two) times daily., Disp: 60 tablet, Rfl: 4  metoprolol succinate (TOPROL-XL) 25 MG 24 hr tablet, Take 1 tablet (25 mg total) by mouth daily., Disp: 30 tablet, Rfl: 3   Multiple Vitamin (MULTIVITAMIN) tablet, Take 1 tablet by mouth in  the morning., Disp: , Rfl:    ONETOUCH ULTRA test strip, USE 1 STRIP TO CHECK GLUCOSE ONCE DAILY, Disp: , Rfl:    pantoprazole (PROTONIX) 40 MG tablet, Take 1 tablet (40 mg total) by mouth daily., Disp: 90 tablet, Rfl: 1   spironolactone (ALDACTONE) 25 MG tablet, Take 0.5 tablets (12.5 mg total) by mouth daily., Disp: 15 tablet, Rfl: 6   Acetaminophen (TYLENOL 8 HOUR PO), Take 2 tablets by mouth daily as needed., Disp: , Rfl:    apixaban (ELIQUIS) 5 MG TABS tablet, Take 1 tablet (5 mg total) by mouth 2 (two) times daily for 2 days., Disp: 60 tablet, Rfl: 6   ondansetron (ZOFRAN) 4 MG tablet, Take 1 tablet (4 mg total) by mouth daily as needed for nausea or vomiting., Disp: 30 tablet, Rfl: 1   prochlorperazine (COMPAZINE) 10 MG tablet, Take 1 tablet (10 mg total) by mouth every 6 (six) hours as needed for nausea or vomiting., Disp: 60 tablet, Rfl: 3 No current facility-administered medications for this visit.  Facility-Administered Medications Ordered in Other Visits:    lactated ringers infusion, , Intravenous, Continuous PRN, Vanessa [place], CRNA, Stopped at 05/11/22 1355   Allergies: Allergies  Allergen Reactions   Amiodarone Rash    REVIEW OF SYSTEMS:   Review of Systems  Constitutional:  Negative for chills, fatigue and fever.  HENT:   Negative for lump/mass, mouth sores, nosebleeds, sore throat and trouble swallowing.   Eyes:  Negative for eye problems.  Respiratory:  Positive for cough. Negative for shortness of breath.   Cardiovascular:  Negative for chest pain, leg swelling and palpitations.  Gastrointestinal:  Positive for diarrhea. Negative for abdominal pain, constipation, nausea and vomiting.  Genitourinary:  Negative for bladder incontinence, difficulty urinating, dysuria, frequency, hematuria and nocturia.   Musculoskeletal:  Positive for back pain (lower). Negative for arthralgias, flank pain, myalgias and neck pain.  Skin:  Negative for itching and rash.   Neurological:  Negative for dizziness, headaches and numbness.  Hematological:  Does not bruise/bleed easily.  Psychiatric/Behavioral:  Negative for depression, sleep disturbance and suicidal ideas. The [name] [name] is not nervous/anxious.   All other systems reviewed and are negative.    VITALS:   Blood pressure (!) 151/60, pulse (!) 57, temperature (!) 95.9 F (35.5 C), temperature source Tympanic, resp. rate 16, weight 208 lb 8 oz (94.6 kg), SpO2 98%.  Wt Readings from Last 3 Encounters:  11/28/22 212 lb 3.2 oz (96.3 kg)  11/23/22 208 lb 8 oz (94.6 kg)  08/23/22 211 lb 9.6 oz (96 kg)    Body mass index is 29.08 kg/m.  Performance status (ECOG): 1 - Symptomatic but completely ambulatory  PHYSICAL EXAM:   Physical Exam Vitals and nursing note reviewed. Exam conducted with a chaperone present.  Constitutional:      Appearance: Normal appearance.  Cardiovascular:     Rate and Rhythm: Normal rate and regular rhythm.     Pulses: Normal pulses.     Heart sounds: Normal heart sounds.  Pulmonary:     Effort: Pulmonary effort is normal.     Breath sounds: Normal breath sounds.  Abdominal:     Palpations: Abdomen is soft. There is no hepatomegaly, splenomegaly or mass.     Tenderness: There is no abdominal tenderness.  Musculoskeletal:     Right lower leg: No edema.     Left lower leg: No edema.  Lymphadenopathy:     Cervical: No cervical adenopathy.     Right cervical: No superficial, deep or posterior cervical adenopathy.    Left cervical: No superficial, deep or posterior cervical adenopathy.     Upper Body:     Right upper body: No supraclavicular or axillary adenopathy.     Left upper body: No supraclavicular or axillary adenopathy.  Neurological:     General: No focal deficit present.     Mental Status: [name] [name] is alert and oriented to person, place, and time.  Psychiatric:        Mood and Affect: Mood normal.        Behavior: Behavior normal.     LABS:      Latest Ref  Rng & Units 11/16/2022    2:45 PM 08/16/2022    2:34 PM 07/29/2022    4:42 AM  CBC  WBC 4.0 - 10.5 K/uL 6.6  7.0  9.8   Hemoglobin 13.0 - 17.0 g/dL 60.4  54.0  98.1   Hematocrit 39.0 - 52.0 % 36.4  38.0  35.1   Platelets 150 - 400 K/uL 145  178  140       Latest Ref Rng & Units 11/16/2022    2:45 PM 08/16/2022    2:34 PM 07/29/2022    4:42 AM  CMP  Glucose 70 - 99 mg/dL 191  478  295   BUN 8 - 23 mg/dL 22  28  18    Creatinine 0.61 - 1.24 mg/dL 6.21  3.08  6.57   Sodium 135 - 145 mmol/L 136  138  134   Potassium 3.5 - 5.1 mmol/L 4.5  4.5  4.4   Chloride 98 - 111 mmol/L 105  104  104   CO2 22 - 32 mmol/L 23  23  24    Calcium 8.9 - 10.3 mg/dL 9.1  9.9  9.1   Total Protein 6.5 - 8.1 g/dL 6.5  7.0  6.0   Total Bilirubin 0.3 - 1.2 mg/dL 0.5  0.5  0.6   Alkaline Phos 38 - 126 U/L 72  84  132   AST 15 - 41 U/L 26  26  78   ALT 0 - 44 U/L 20  25  223      Lab Results  Component Value Date   CEA1 6.2 (H) 11/16/2022   CEA 1.8 03/07/2007   /  CEA  Date Value Ref Range Status  11/16/2022 6.2 (H) 0.0 - 4.7 ng/mL Final    Comment:    (NOTE)                             Nonsmokers          <3.9                             Smokers             <5.6 [employer] Electrochemiluminescence Immunoassay (ECLIA) Values obtained with different assay methods or kits cannot be used interchangeably.  Results cannot be interpreted as absolute evidence of the presence or absence of malignant disease. Performed At: [facility] [address] [place], [place] [id number] Jolene Schimke MD [id number]   03/07/2007 1.8  Final   No results found for: "PSA1" No  results found for: "CAN199" No results found for: "CAN125"  Lab Results  Component Value Date   TOTALPROTELP 6.3 03/08/2021   ALBUMINELP 3.8 03/08/2021   A1GS 0.2 03/08/2021   A2GS 0.7 03/08/2021   BETS 0.8 03/08/2021   GAMS 0.8 03/08/2021   MSPIKE Not Observed 03/08/2021   SPEI Comment 03/08/2021   Lab Results   Component Value Date   TIBC 297 11/16/2022   TIBC 322 08/16/2022   TIBC 322 05/15/2022   FERRITIN 35 11/16/2022   FERRITIN 48 08/16/2022   FERRITIN 32 05/15/2022   IRONPCTSAT 18 11/16/2022   IRONPCTSAT 14 (L) 08/16/2022   IRONPCTSAT 28 05/15/2022   Lab Results  Component Value Date   LDH 169 06/19/2007     STUDIES:   No results found.

## 2022-11-23 ENCOUNTER — Inpatient Hospital Stay: Payer: Medicare HMO | Admitting: Hematology

## 2022-11-23 VITALS — BP 151/60 | HR 57 | Temp 95.9°F | Resp 16 | Wt 208.5 lb

## 2022-11-23 DIAGNOSIS — Z85038 Personal history of other malignant neoplasm of large intestine: Secondary | ICD-10-CM | POA: Diagnosis not present

## 2022-11-23 DIAGNOSIS — D509 Iron deficiency anemia, unspecified: Secondary | ICD-10-CM

## 2022-11-23 DIAGNOSIS — Z8639 Personal history of other endocrine, nutritional and metabolic disease: Secondary | ICD-10-CM | POA: Diagnosis not present

## 2022-11-23 DIAGNOSIS — C184 Malignant neoplasm of transverse colon: Secondary | ICD-10-CM

## 2022-11-23 DIAGNOSIS — Z8546 Personal history of malignant neoplasm of prostate: Secondary | ICD-10-CM | POA: Diagnosis not present

## 2022-11-23 NOTE — Patient Instructions (Signed)
[facility] ?Discharge Instructions ? ? ?You were seen and examined today by Dr. Katragadda. ? ?He reviewed the results of your lab work which are normal/stable.  ? ?Return as scheduled. ? ? ?Thank you for choosing [facility] to provide your oncology and hematology care.  To afford each patient quality time with our provider, please arrive at least 15 minutes before your scheduled appointment time.  ? ?If you have a lab appointment with the Cancer Center please come in thru the Main Entrance and check in at the main information desk. ? ?You need to re-schedule your appointment should you arrive 10 or more minutes late.  We strive to give you quality time with our providers, and arriving late affects you and other patients whose appointments are after yours.  Also, if you no show three or more times for appointments you may be dismissed from the clinic at the providers discretion.     ?Again, thank you for choosing [facility].  Our hope is that these requests will decrease the amount of time that you wait before being seen by our physicians.       ?_____________________________________________________________ ? ?Should you have questions after your visit to [facility], please contact our office at [phone] and follow the prompts.  Our office hours are 8:00 a.m. and 4:30 p.m. Monday - Friday.  Please note that voicemails left after 4:00 p.m. may not be returned until the following business day.  We are closed weekends and major holidays.  You do have access to a nurse 24-7, just call the main number to the clinic [phone] and do not press any options, hold on the line and a nurse will answer the phone.   ? ?For prescription refill requests, have your pharmacy contact our office and allow 72 hours.   ? ?Due to Covid, you will need to wear a mask upon entering the hospital. If you do not have a mask, a mask  will be given to you at the Main Entrance upon arrival. For doctor visits, patients may have 1 support person age 18 or older with them. For treatment visits, patients can not have anyone with them due to social distancing guidelines and our immunocompromised population.  ? ?   ?

## 2022-11-28 ENCOUNTER — Ambulatory Visit: Payer: Medicare HMO | Attending: Cardiology | Admitting: Cardiology

## 2022-11-28 ENCOUNTER — Encounter: Payer: Self-pay | Admitting: Cardiology

## 2022-11-28 VITALS — BP 130/58 | HR 53 | Ht 71.0 in | Wt 212.2 lb

## 2022-11-28 DIAGNOSIS — I480 Paroxysmal atrial fibrillation: Secondary | ICD-10-CM

## 2022-11-28 DIAGNOSIS — E782 Mixed hyperlipidemia: Secondary | ICD-10-CM

## 2022-11-28 DIAGNOSIS — I25119 Atherosclerotic heart disease of native coronary artery with unspecified angina pectoris: Secondary | ICD-10-CM | POA: Diagnosis not present

## 2022-11-28 NOTE — Patient Instructions (Addendum)

## 2022-11-28 NOTE — Progress Notes (Signed)
Cardiology Office Note  Date: 11/28/2022   ID: [name] [name], DOB 11/28/1940, MRN [id number]  History of Present Illness: [name] [name] is an 82 y.o. male last seen in February.  He is here with his wife for a follow-up visit.  Reports no angina and stable NYHA class II dyspnea.  Has had trouble with his balance and uses a cane, but reports no falls.  He walks about 2 miles on a trail most days each week with a friend.  Records indicate hospitalization with cholelithiasis and cholecystitis in May, ultimately underwent robotic assisted laparoscopic cholecystectomy.  No obvious perioperative cardiac events.  I reviewed his medications.  He remains on Eliquis for stroke prophylaxis, does not report any spontaneous bleeding problems.  Also on Toprol-XL and Cardizem CD for heart rate control.  Physical Exam: VS:  BP (!) 130/58   Pulse (!) 53   Ht 5\' 11"  (1.803 m)   Wt 212 lb 3.2 oz (96.3 kg)   SpO2 97%   BMI 29.60 kg/m , BMI Body mass index is 29.6 kg/m.  Wt Readings from Last 3 Encounters:  11/28/22 212 lb 3.2 oz (96.3 kg)  11/23/22 208 lb 8 oz (94.6 kg)  08/23/22 211 lb 9.6 oz (96 kg)    General: Patient appears comfortable at rest. HEENT: Conjunctiva and lids normal. Neck: Supple, no elevated JVP or carotid bruits. Lungs: Clear to auscultation, nonlabored breathing at rest. Cardiac: RRR, 2/6 systolic murmur. Abdomen: Soft, nontender, bowel sounds present.  ECG:  An ECG dated 07/25/2022 was personally reviewed today and demonstrated:  Sinus rhythm with right bundle branch block and left anterior fascicular block.  Labwork: November 2023: Cholesterol 111, triglycerides 58, HDL 50, LDL 48 07/26/2022: Magnesium 2.4 11/16/2022: ALT 20; AST 26; BUN 22; Creatinine, Ser 1.27; Hemoglobin 11.9; Platelets 145; Potassium 4.5; Sodium 136   Other Studies Reviewed Today:  Echocardiogram 07/26/2022:  1. Left ventricular ejection fraction, by estimation, is 70 to 75%. The  left ventricle has  hyperdynamic function. The left ventricle has no  regional wall motion abnormalities. There is severe left ventricular  hypertrophy. Left ventricular diastolic  parameters are consistent with Grade I diastolic dysfunction (impaired  relaxation). Elevated left atrial pressure. The average left ventricular  global longitudinal strain is -20.0 %. The global longitudinal strain is  normal.   2. Right ventricular systolic function is normal. The right ventricular  size is normal. Tricuspid regurgitation signal is inadequate for assessing  PA pressure.   3. Left atrial size was severely dilated.   4. A small pericardial effusion is present. The pericardial effusion is  circumferential. There is no evidence of cardiac tamponade.   5. The mitral valve is normal in structure. No evidence of mitral valve  regurgitation. No evidence of mitral stenosis.   6. The aortic valve is tricuspid. There is moderate calcification of the  aortic valve. There is moderate thickening of the aortic valve. Aortic  valve regurgitation is not visualized. Mild aortic valve stenosis.   7. The inferior vena cava is normal in size with greater than 50%  respiratory variability, suggesting right atrial pressure of 3 mmHg.   Assessment and Plan:  1.  Paroxysmal atrial fibrillation with CHA2DS2-VASc score of 3.  He remains on Eliquis for stroke prophylaxis.  Asymptomatic in terms of palpitations.  He is on combination of Cardizem CD and Toprol-XL as well.  No spontaneous bleeding problems reported.   2.  CAD status post DES to the circumflex in  2019.  No active angina.  LVEF 70 to 75% with severe LVH and mild diastolic dysfunction by follow-up echocardiogram in May.  He is not on aspirin given use of Eliquis.  Continue Lipitor.   3.  Mixed hyperlipidemia on Lipitor.  LDL 48 November of last year.   4.  Mild calcific aortic stenosis by echocardiogram in May..  Asymptomatic.   5.  Essential hypertension hypertension on  multimodal therapy.  Blood pressure well-controlled today.  Disposition:  Follow up  6 months.  Signed, Jonelle Sidle, M.D., F.A.C.C. [facility]

## 2022-12-15 DIAGNOSIS — E1165 Type 2 diabetes mellitus with hyperglycemia: Secondary | ICD-10-CM | POA: Diagnosis not present

## 2022-12-15 DIAGNOSIS — E1122 Type 2 diabetes mellitus with diabetic chronic kidney disease: Secondary | ICD-10-CM | POA: Diagnosis not present

## 2022-12-15 DIAGNOSIS — Z1329 Encounter for screening for other suspected endocrine disorder: Secondary | ICD-10-CM | POA: Diagnosis not present

## 2022-12-15 DIAGNOSIS — E7849 Other hyperlipidemia: Secondary | ICD-10-CM | POA: Diagnosis not present

## 2022-12-15 DIAGNOSIS — N182 Chronic kidney disease, stage 2 (mild): Secondary | ICD-10-CM | POA: Diagnosis not present

## 2022-12-15 DIAGNOSIS — K219 Gastro-esophageal reflux disease without esophagitis: Secondary | ICD-10-CM | POA: Diagnosis not present

## 2022-12-15 DIAGNOSIS — E1169 Type 2 diabetes mellitus with other specified complication: Secondary | ICD-10-CM | POA: Diagnosis not present

## 2022-12-22 ENCOUNTER — Other Ambulatory Visit: Payer: Self-pay | Admitting: Cardiology

## 2022-12-22 DIAGNOSIS — D692 Other nonthrombocytopenic purpura: Secondary | ICD-10-CM | POA: Diagnosis not present

## 2022-12-22 DIAGNOSIS — E6609 Other obesity due to excess calories: Secondary | ICD-10-CM | POA: Diagnosis not present

## 2022-12-22 DIAGNOSIS — C189 Malignant neoplasm of colon, unspecified: Secondary | ICD-10-CM | POA: Diagnosis not present

## 2022-12-22 DIAGNOSIS — Z23 Encounter for immunization: Secondary | ICD-10-CM | POA: Diagnosis not present

## 2022-12-22 DIAGNOSIS — I10 Essential (primary) hypertension: Secondary | ICD-10-CM | POA: Diagnosis not present

## 2022-12-22 DIAGNOSIS — E1169 Type 2 diabetes mellitus with other specified complication: Secondary | ICD-10-CM | POA: Diagnosis not present

## 2022-12-22 DIAGNOSIS — M1000 Idiopathic gout, unspecified site: Secondary | ICD-10-CM | POA: Diagnosis not present

## 2022-12-22 DIAGNOSIS — E7849 Other hyperlipidemia: Secondary | ICD-10-CM | POA: Diagnosis not present

## 2022-12-22 DIAGNOSIS — I4891 Unspecified atrial fibrillation: Secondary | ICD-10-CM | POA: Diagnosis not present

## 2022-12-22 DIAGNOSIS — G2581 Restless legs syndrome: Secondary | ICD-10-CM | POA: Diagnosis not present

## 2022-12-22 DIAGNOSIS — J301 Allergic rhinitis due to pollen: Secondary | ICD-10-CM | POA: Diagnosis not present

## 2022-12-22 DIAGNOSIS — D509 Iron deficiency anemia, unspecified: Secondary | ICD-10-CM | POA: Diagnosis not present

## 2023-01-11 DIAGNOSIS — C61 Malignant neoplasm of prostate: Secondary | ICD-10-CM | POA: Diagnosis not present

## 2023-01-18 DIAGNOSIS — M79672 Pain in left foot: Secondary | ICD-10-CM | POA: Diagnosis not present

## 2023-01-18 DIAGNOSIS — M79671 Pain in right foot: Secondary | ICD-10-CM | POA: Diagnosis not present

## 2023-01-18 DIAGNOSIS — M79675 Pain in left toe(s): Secondary | ICD-10-CM | POA: Diagnosis not present

## 2023-01-18 DIAGNOSIS — E1140 Type 2 diabetes mellitus with diabetic neuropathy, unspecified: Secondary | ICD-10-CM | POA: Diagnosis not present

## 2023-01-18 DIAGNOSIS — M79674 Pain in right toe(s): Secondary | ICD-10-CM | POA: Diagnosis not present

## 2023-01-18 DIAGNOSIS — L110 Acquired keratosis follicularis: Secondary | ICD-10-CM | POA: Diagnosis not present

## 2023-01-18 DIAGNOSIS — I739 Peripheral vascular disease, unspecified: Secondary | ICD-10-CM | POA: Diagnosis not present

## 2023-01-23 ENCOUNTER — Other Ambulatory Visit: Payer: Self-pay | Admitting: Cardiology

## 2023-01-24 ENCOUNTER — Telehealth: Payer: Self-pay | Admitting: Cardiology

## 2023-01-24 MED ORDER — BENAZEPRIL HCL 40 MG PO TABS: 40.0000 mg | ORAL_TABLET | Freq: Every day | ORAL | 3 refills | Status: DC

## 2023-01-24 NOTE — Telephone Encounter (Signed)
Refill sent.

## 2023-01-24 NOTE — Telephone Encounter (Signed)
*  STAT* If patient is at the pharmacy, call can be transferred to refill team.   1. Which medications need to be refilled? (please list name of each medication and dose if known) benazepril (LOTENSIN) 40 MG tablet   2. Which pharmacy/location (including street and city if local pharmacy) is medication to be sent to? [facility] [address] [place] - [address] Phone: [phone]  Fax: [phone]      3. Do they need a 30 day or 90 day supply? 90

## 2023-02-08 DIAGNOSIS — R030 Elevated blood-pressure reading, without diagnosis of hypertension: Secondary | ICD-10-CM | POA: Diagnosis not present

## 2023-02-08 DIAGNOSIS — Z6829 Body mass index (BMI) 29.0-29.9, adult: Secondary | ICD-10-CM | POA: Diagnosis not present

## 2023-02-08 DIAGNOSIS — R059 Cough, unspecified: Secondary | ICD-10-CM | POA: Diagnosis not present

## 2023-02-08 DIAGNOSIS — J0101 Acute recurrent maxillary sinusitis: Secondary | ICD-10-CM | POA: Diagnosis not present

## 2023-02-21 ENCOUNTER — Ambulatory Visit (HOSPITAL_COMMUNITY)
Admission: RE | Admit: 2023-02-21 | Discharge: 2023-02-21 | Disposition: A | Discharge status: Home / Self Care | Payer: Medicare HMO | Source: Ambulatory Visit | Attending: Hematology | Admitting: Hematology

## 2023-02-21 ENCOUNTER — Inpatient Hospital Stay: Payer: Medicare HMO | Attending: Hematology

## 2023-02-21 DIAGNOSIS — C184 Malignant neoplasm of transverse colon: Secondary | ICD-10-CM | POA: Insufficient documentation

## 2023-02-21 DIAGNOSIS — D509 Iron deficiency anemia, unspecified: Secondary | ICD-10-CM

## 2023-02-21 DIAGNOSIS — N200 Calculus of kidney: Secondary | ICD-10-CM | POA: Diagnosis not present

## 2023-02-21 DIAGNOSIS — K5730 Diverticulosis of large intestine without perforation or abscess without bleeding: Secondary | ICD-10-CM | POA: Insufficient documentation

## 2023-02-21 DIAGNOSIS — C189 Malignant neoplasm of colon, unspecified: Secondary | ICD-10-CM | POA: Diagnosis not present

## 2023-02-21 DIAGNOSIS — Z79899 Other long term (current) drug therapy: Secondary | ICD-10-CM | POA: Diagnosis not present

## 2023-02-21 DIAGNOSIS — I700 Atherosclerosis of aorta: Secondary | ICD-10-CM | POA: Insufficient documentation

## 2023-02-21 DIAGNOSIS — I3139 Other pericardial effusion (noninflammatory): Secondary | ICD-10-CM | POA: Diagnosis not present

## 2023-02-21 DIAGNOSIS — K862 Cyst of pancreas: Secondary | ICD-10-CM | POA: Diagnosis not present

## 2023-02-21 DIAGNOSIS — Z85038 Personal history of other malignant neoplasm of large intestine: Secondary | ICD-10-CM | POA: Insufficient documentation

## 2023-02-21 DIAGNOSIS — Z8546 Personal history of malignant neoplasm of prostate: Secondary | ICD-10-CM | POA: Insufficient documentation

## 2023-02-21 LAB — CBC WITH DIFFERENTIAL/PLATELET
Abs Immature Granulocytes: 0.05 10*3/uL (ref 0.00–0.07)
Basophils Absolute: 0.1 10*3/uL (ref 0.0–0.1)
Basophils Relative: 1 %
Eosinophils Absolute: 0.2 10*3/uL (ref 0.0–0.5)
Eosinophils Relative: 2 %
HCT: 37.8 % — ABNORMAL LOW (ref 39.0–52.0)
Hemoglobin: 12 g/dL — ABNORMAL LOW (ref 13.0–17.0)
Immature Granulocytes: 1 %
Lymphocytes Relative: 15 %
Lymphs Abs: 1.1 10*3/uL (ref 0.7–4.0)
MCH: 30.5 pg (ref 26.0–34.0)
MCHC: 31.7 g/dL (ref 30.0–36.0)
MCV: 96.2 fL (ref 80.0–100.0)
Monocytes Absolute: 0.6 10*3/uL (ref 0.1–1.0)
Monocytes Relative: 8 %
Neutro Abs: 5.3 10*3/uL (ref 1.7–7.7)
Neutrophils Relative %: 73 %
Platelets: 172 10*3/uL (ref 150–400)
RBC: 3.93 MIL/uL — ABNORMAL LOW (ref 4.22–5.81)
RDW: 13.9 % (ref 11.5–15.5)
WBC: 7.2 10*3/uL (ref 4.0–10.5)
nRBC: 0 % (ref 0.0–0.2)

## 2023-02-21 LAB — COMPREHENSIVE METABOLIC PANEL
ALT: 23 U/L (ref 0–44)
AST: 25 U/L (ref 15–41)
Albumin: 4 g/dL (ref 3.5–5.0)
Alkaline Phosphatase: 68 U/L (ref 38–126)
Anion gap: 8 (ref 5–15)
BUN: 21 mg/dL (ref 8–23)
CO2: 23 mmol/L (ref 22–32)
Calcium: 9.3 mg/dL (ref 8.9–10.3)
Chloride: 103 mmol/L (ref 98–111)
Creatinine, Ser: 0.9 mg/dL (ref 0.61–1.24)
GFR, Estimated: >60 mL/min (ref >60)
Glucose, Bld: 207 mg/dL — ABNORMAL HIGH (ref 70–99)
Potassium: 4 mmol/L (ref 3.5–5.1)
Sodium: 134 mmol/L — ABNORMAL LOW (ref 135–145)
Total Bilirubin: 0.8 mg/dL (ref <1.2)
Total Protein: 6.6 g/dL (ref 6.5–8.1)

## 2023-02-21 LAB — IRON AND TIBC
Iron: 64 ug/dL (ref 45–182)
Saturation Ratios: 20 % (ref 17.9–39.5)
TIBC: 316 ug/dL (ref 250–450)
UIBC: 252 ug/dL

## 2023-02-21 LAB — FERRITIN: Ferritin: 39 ng/mL (ref 24–336)

## 2023-02-21 LAB — MAGNESIUM: Magnesium: 1.9 mg/dL (ref 1.7–2.4)

## 2023-02-21 MED ORDER — IOHEXOL 300 MG/ML  SOLN
100.0000 mL | Freq: Once | INTRAMUSCULAR | Status: AC | PRN
  Administered 2023-02-21: 100 mL via INTRAVENOUS

## 2023-02-22 LAB — CEA: CEA: 6.3 ng/mL — ABNORMAL HIGH (ref 0.0–4.7)

## 2023-02-27 NOTE — Progress Notes (Signed)
[facility] [address], [place] [address]    Clinic Day:  02/28/2023  Referring physician: Richardean Chimera, MD  Patient Care Team: Richardean Chimera, MD as PCP - General (Unknown Physician Specialty) Jonelle Sidle, MD as PCP - Cardiology (Cardiology) Doreatha Massed, MD as Medical Oncologist (Medical Oncology)   ASSESSMENT & PLAN:   Assessment: 1. Colon cancer: - Colonoscopy on 10/12/2020 with 7 sessile polyps found in the transverse colon and cecum.  12 mm polyp found in the transverse colon, sessile.  Polyp was removed with piecemeal technique using cold snare.  2 sessile polyps found in the sigmoid colon, removed with cold snare. - Pathology cecal tubular adenoma, sessile serrated polyp of the transverse colon polypectomy, adenocarcinoma arising from a tubular adenoma with high-grade dysplasia of the transverse colon polypectomy.  Sigmoid colon polypectomy showed tubular adenoma, hyperplastic polyp. - Colonoscopy on 02/25/2021 with three 3 to 5 mm polyps in the transverse colon, removed with cold snare.  One 10 mm polyp in the descending colon at 54 cm proximal to the anus. - Pathology on 02/25/2021 shows invasive adenocarcinoma, poorly differentiated, grade 3 of the descending colon polypectomy.  CDX2 positive.  Other findings include transverse colon tubular adenoma and sessile serrated adenoma. - Right hemicolectomy on 05/20/2021. - Pathology shows grade 3 adenocarcinoma, mucinous features, 1.7 cm in the transverse colon, metastatic adenocarcinoma involving 2/29 lymph nodes.  No LVI/perineural invasion.  pT1, PN 1B. - Adjuvant Xeloda 1500 mg 2 weeks on/1 week off started on 07/06/2021.  Cycle 2 on 07/27/2021, 3 tablets twice daily.  Cycle 3 on 08/26/2021, 2 tablets twice daily 2 weeks on/1 week off, dose reduced secondary to HFSR.  Cycle 4 on 09/16/2021.  Cycle 5 Xeloda 1000 mg twice daily on 10/08/2021.  Completed cycle 8 on 01/30/2022.  2. Social/family  history: He lives at home with his wife.  He quit smoking 23 years ago.  He smoked 1 pack/day for 7 years.  He worked in textile's prior to retirement.  He is active and walks 2 miles per day. - Father had prostate cancer.  Brother had lung cancer.  Another brother had colon cancer (? colon), Sister with lung cancer, another sister with cancer, patient unknown.  3.  Prostate cancer: - He was diagnosed with prostate cancer in August 2007, status post seed implants.  His PSA has been undetectable since then.    Plan: 1. Stage III (T1N1B) transverse colon adenocarcinoma: - Denies any change in bowel habits.  No bleeding per rectum or melena. - Labs from 02/21/2023: Normal LFTs.  CEA is 6.3 and stable. - Reviewed CT [facility] (02/21/2023): Lymph node in the central pelvis measures 16 mm, previously 11 mm.  Right lower quadrant mesenteric lymph node measures 11 mm, previously 10 mm.  Stable 14 mm cyst in the pancreatic body.  Other benign findings discussed with the patient. - He has history of waxing and waning mesenteric lymph nodes. - I have recommended follow-up in 3 months with repeat CEA and other labs.  If CEA increases significantly, will consider imaging.  Otherwise I will repeat CT scan in 6 months.  2.  Normocytic anemia: - He is taking iron tablet twice daily.  Ferritin is 39 and hemoglobin is 12. - Continue iron tablet twice daily.  Will repeat ferritin and iron panel at next visit in 3 months.    Orders Placed This Encounter  Procedures   CBC with Differential    Standing Status:  Future    Standing Expiration Date:   02/28/2024   Comprehensive metabolic panel    Standing Status:   Future    Standing Expiration Date:   02/28/2024   CEA    Standing Status:   Future    Standing Expiration Date:   02/28/2024   Iron and TIBC ([facility] ONLY)    Standing Status:   Future    Standing Expiration Date:   02/28/2024   Ferritin    Standing Status:   Future    Standing  Expiration Date:   02/28/2024      I,Katie Daubenspeck,acting as a scribe for Doreatha Massed, MD.,have documented all relevant documentation on the behalf of Doreatha Massed, MD,as directed by  Doreatha Massed, MD while in the presence of Doreatha Massed, MD.   I, Doreatha Massed MD, have reviewed the above documentation for accuracy and completeness, and I agree with the above.   Doreatha Massed, MD   12/4/202410:24 AM  CHIEF COMPLAINT:   Diagnosis: colon cancer    Cancer Staging  Colon cancer [facility]) Staging form: Colon and Rectum, AJCC 8th Edition - Clinical stage from 03/22/2021: Stage I (cT1, cN0, cM0) - Unsigned - Pathologic stage from 06/23/2021: Stage IIIA (pT1, pN1b, cM0) - Unsigned    Prior Therapy: 1. Right hemicolectomy on 05/20/21 2. Adjuvant Xeloda 07/07/22 - 01/30/22  Current Therapy:  surveillance    HISTORY OF PRESENT ILLNESS:   Oncology History   No history exists.     INTERVAL HISTORY:   Sajjad is a 82 y.o. male presenting to clinic today for follow up of colon cancer. He was last seen by me on 11/23/22.  Since his last visit, he underwent surveillance CT A/P on 02/21/23 showing: slight interval increase in size of mesenteric lymph nodes; no suspicious nodularity along suture line of prior right hemicolectomy; stable 14 mm cyst in pancreatic body, likely a side branch IPMN.  Today, he states that he is doing well overall. His appetite level is at 100%. His energy level is at 85%.  PAST MEDICAL HISTORY:   Past Medical History: Past Medical History:  Diagnosis Date   Anemia    Arthritis    CAD (coronary artery disease)    DES to circumflex 12/2017   Colon cancer (HCC) 2023   Diabetes mellitus without complication (HCC)    Dysrhythmia    GERD (gastroesophageal reflux disease)    Glaucoma    Gout    Headache    History of kidney stones    Hypertension    NSTEMI (non-ST elevated myocardial infarction) (HCC) 12/31/2017    Paroxysmal atrial fibrillation (HCC)    Pericardial effusion    Idiopathic, recurrent pericardial effusion s/p pericardial window.   Prostate cancer (HCC) 2007   S/P Seed implants    Supraventricular tachycardia    Temporal arteritis [facility])     Surgical History: Past Surgical History:  Procedure Laterality Date   BIOPSY  10/12/2020   Procedure: BIOPSY;  Surgeon: Dolores Frame, MD;  [facility];  Service: Gastroenterology;;   BIOPSY  02/25/2021   Procedure: BIOPSY;  Surgeon: Dolores Frame, MD;  [facility]: [facility];  Service: Gastroenterology;;   BIOPSY  04/11/2021   Procedure: BIOPSY;  Surgeon: Lemar Lofty., MD;  Location: [facility];  Service: Gastroenterology;;   CATARACT EXTRACTION W/PHACO Left 06/07/2015   Procedure: CATARACT EXTRACTION PHACO AND INTRAOCULAR LENS PLACEMENT LEFT EYE CDE=8.00;  Surgeon: Gemma Payor, MD;  Location: [facility];  Service:  Ophthalmology;  Laterality: Left;   CATARACT EXTRACTION W/PHACO Right 06/17/2015   Procedure: CATARACT EXTRACTION PHACO AND INTRAOCULAR LENS PLACEMENT RIGHT EYE CDE=11.09;  Surgeon: Gemma Payor, MD;  Location: [facility];  Service: Ophthalmology;  Laterality: Right;   COLONOSCOPY WITH PROPOFOL N/A 10/12/2020   Procedure: COLONOSCOPY WITH PROPOFOL;  Surgeon: Dolores Frame, MD;  [facility];  Service: Gastroenterology;  Laterality: N/A;  10:35   COLONOSCOPY WITH PROPOFOL N/A 02/25/2021   Procedure: COLONOSCOPY WITH PROPOFOL;  Surgeon: Dolores Frame, MD;  [facility];  Service: Gastroenterology;  Laterality: N/A;  8:10   COLONOSCOPY WITH PROPOFOL N/A 07/24/2022   Procedure: COLONOSCOPY WITH PROPOFOL;  Surgeon: Meridee Score Netty Starring., MD;  Location: [facility];  Service: Gastroenterology;  Laterality: N/A;   CORONARY ANGIOPLASTY WITH STENT PLACEMENT  01/01/2018   CORONARY STENT INTERVENTION N/A 01/01/2018   Procedure: CORONARY STENT INTERVENTION;  Surgeon:  Corky Crafts, MD;  [facility];  Service: Cardiovascular;  Laterality: N/A;   ENDOSCOPIC MUCOSAL RESECTION N/A 04/11/2021   Procedure: ENDOSCOPIC MUCOSAL RESECTION;  Surgeon: Meridee Score Netty Starring., MD;  [facility]: [facility];  Service: Gastroenterology;  Laterality: N/A;   ENTEROSCOPY N/A 10/12/2020   Procedure: PUSH ENTEROSCOPY;  Surgeon: Dolores Frame, MD;  [facility];  Service: Gastroenterology;  Laterality: N/A;   ENTEROSCOPY N/A 04/11/2021   Procedure: ENTEROSCOPY;  Surgeon: Meridee Score Netty Starring., MD;  Location: [facility];  Service: Gastroenterology;  Laterality: N/A;   ENTEROSCOPY N/A 07/24/2022   Procedure: ENTEROSCOPY;  Surgeon: Meridee Score Netty Starring., MD;  Location: [facility];  Service: Gastroenterology;  Laterality: N/A;   ESOPHAGOGASTRODUODENOSCOPY (EGD) WITH PROPOFOL N/A 10/12/2020   Procedure: ESOPHAGOGASTRODUODENOSCOPY (EGD) WITH PROPOFOL;  Surgeon: Dolores Frame, MD;  [facility];  Service: Gastroenterology;  Laterality: N/A;   FRACTURE SURGERY Left 2010   ankle   HEMOSTASIS CLIP PLACEMENT  04/11/2021   Procedure: HEMOSTASIS CLIP PLACEMENT;  Surgeon: Lemar Lofty., MD;  [facility]: [facility];  Service: Gastroenterology;;   INSERTION PROSTATE RADIATION SEED  2007   KNEE ARTHROSCOPY Left    LAPAROSCOPY N/A 05/22/2021   Procedure: LAPAROSCOPY DIAGNOSTIC WITH WASHOUT OF ABDOMEN;  Surgeon: Romie Levee, MD;  Location: [facility];  Service: General;  Laterality: N/A;   LEFT HEART CATH AND CORONARY ANGIOGRAPHY N/A 01/01/2018   Procedure: LEFT HEART CATH AND CORONARY ANGIOGRAPHY;  Surgeon: Corky Crafts, MD;  [facility];  Service: Cardiovascular;  Laterality: N/A;   ORIF TIBIA & FIBULA FRACTURES Left 2003   Distal tibial/fibula    PERICARDIAL WINDOW  02/2007   PERICARDIOCENTESIS  2007   hx/notes 10/19/2011   POLYPECTOMY  10/12/2020   Procedure: POLYPECTOMY;  Surgeon:  Dolores Frame, MD;  [facility];  Service: Gastroenterology;;  small bowel, cecal   POLYPECTOMY  02/25/2021   Procedure: POLYPECTOMY;  Surgeon: Dolores Frame, MD;  [facility];  Service: Gastroenterology;;  transverse colon x2   POLYPECTOMY  07/24/2022   Procedure: POLYPECTOMY;  Surgeon: Lemar Lofty., MD;  Location: [facility];  Service: Gastroenterology;;   Brooke Dare INJECTION  04/11/2021   Procedure: SUBMUCOSAL LIFTING INJECTION;  Surgeon: Lemar Lofty., MD;  Location: [facility];  Service: Gastroenterology;;   SUBMUCOSAL TATTOO INJECTION  04/11/2021   Procedure: SUBMUCOSAL TATTOO INJECTION;  Surgeon: Lemar Lofty., MD;  Location: [facility];  Service: Gastroenterology;;    Social History: Social History   Socioeconomic History   Marital status: Married  Spouse name: Not on file   Number of children: Not on file   Years of education: Not on file   Highest education level: Not on file  Occupational History   Not on file  Tobacco Use   Smoking status: Former    Current packs/day: 0.00    Average packs/day: 0.3 packs/day for 10.0 years (3.0 ttl pk-yrs)    Types: Cigarettes    Start date: 08/06/1958    Quit date: 1968    Years since quitting: 56.9   Smokeless tobacco: Never  Vaping Use   Vaping status: Never Used  Substance and Sexual Activity   Alcohol use: Not Currently   Drug use: Never   Sexual activity: Yes  Other Topics Concern   Not on file  Social History Narrative   Not on file   Social Determinants of Health   Financial Resource Strain: Not on file  Food Insecurity: Not on file  Transportation Needs: Not on file  Physical Activity: Not on file  Stress: Not on file  Social Connections: Not on file  Intimate Partner Violence: Not on file    Family History: Family History  Problem Relation Age of Onset   CAD Mother        MI in 26s   Prostate cancer Father     Prostate cancer Brother    Lung cancer Brother     Current Medications:  Current Outpatient Medications:    Acetaminophen (TYLENOL 8 HOUR PO), Take 2 tablets by mouth daily as needed., Disp: , Rfl:    allopurinol (ZYLOPRIM) 300 MG tablet, Take 450 mg by mouth in the morning., Disp: , Rfl:    aluminum-magnesium hydroxide 200-200 MG/5ML suspension, Take 5 mLs by mouth every 6 (six) hours as needed for indigestion., Disp: , Rfl:    Ascorbic Acid (VITAMIN C) 1000 MG tablet, Take 1,000 mg by mouth 2 (two) times daily., Disp: , Rfl:    atorvastatin (LIPITOR) 80 MG tablet, Take 1 tablet (80 mg total) by mouth daily., Disp: 90 tablet, Rfl: 3   benazepril (LOTENSIN) 40 MG tablet, Take 1 tablet (40 mg total) by mouth daily., Disp: 30 tablet, Rfl: 3   brimonidine (ALPHAGAN) 0.2 % ophthalmic solution, 1 drop 2 (two) times daily., Disp: , Rfl:    cyanocobalamin 1000 MCG tablet, Take 1 tablet (1,000 mcg total) by mouth daily., Disp: , Rfl:    diltiazem (CARDIZEM CD) 240 MG 24 hr capsule, Take 240 mg by mouth daily., Disp: , Rfl:    dorzolamide (TRUSOPT) 2 % ophthalmic solution, Place 1 drop into both eyes 2 (two) times daily., Disp: , Rfl:    FERREX 150 150 MG capsule, Take 1 capsule (150 mg total) by mouth 2 (two) times daily., Disp: 60 capsule, Rfl: 5   hydrALAZINE (APRESOLINE) 100 MG tablet, Take 1 tablet (100 mg total) by mouth 3 (three) times daily., Disp: 90 tablet, Rfl: 3   loperamide (IMODIUM) 2 MG capsule, Take 2 mg by mouth as needed for diarrhea or loose stools., Disp: , Rfl:    metFORMIN (GLUCOPHAGE) 500 MG tablet, Take 1 tablet (500 mg total) by mouth 2 (two) times daily., Disp: 60 tablet, Rfl: 4   metoprolol succinate (TOPROL-XL) 25 MG 24 hr tablet, Take 1 tablet (25 mg total) by mouth daily., Disp: 30 tablet, Rfl: 3   Multiple Vitamin (MULTIVITAMIN) tablet, Take 1 tablet by mouth in the morning., Disp: , Rfl:    ondansetron (ZOFRAN) 4 MG tablet, Take 1 tablet (4  mg total) by mouth daily as  needed for nausea or vomiting., Disp: 30 tablet, Rfl: 1   ONETOUCH ULTRA test strip, USE 1 STRIP TO CHECK GLUCOSE ONCE DAILY, Disp: , Rfl:    pantoprazole (PROTONIX) 40 MG tablet, Take 1 tablet (40 mg total) by mouth daily., Disp: 90 tablet, Rfl: 1   prochlorperazine (COMPAZINE) 10 MG tablet, Take 1 tablet (10 mg total) by mouth every 6 (six) hours as needed for nausea or vomiting., Disp: 60 tablet, Rfl: 3   spironolactone (ALDACTONE) 25 MG tablet, Take 1/2 (one-half) tablet by mouth once daily, Disp: 45 tablet, Rfl: 1   apixaban (ELIQUIS) 5 MG TABS tablet, Take 1 tablet (5 mg total) by mouth 2 (two) times daily for 2 days., Disp: 60 tablet, Rfl: 6 No current facility-administered medications for this visit.  Facility-Administered Medications Ordered in Other Visits:    lactated ringers infusion, , Intravenous, Continuous PRN, Vanessa Bostonia, CRNA, Stopped at 05/11/22 1355   Allergies: Allergies  Allergen Reactions   Amiodarone Rash    REVIEW OF SYSTEMS:   Review of Systems  Constitutional:  Negative for chills, fatigue and fever.  HENT:   Negative for lump/mass, mouth sores, nosebleeds, sore throat and trouble swallowing.   Eyes:  Negative for eye problems.  Respiratory:  Negative for cough and shortness of breath.   Cardiovascular:  Negative for chest pain, leg swelling and palpitations.  Gastrointestinal:  Negative for abdominal pain, constipation, diarrhea, nausea and vomiting.  Genitourinary:  Negative for bladder incontinence, difficulty urinating, dysuria, frequency, hematuria and nocturia.   Musculoskeletal:  Negative for arthralgias, back pain, flank pain, myalgias and neck pain.  Skin:  Negative for itching and rash.  Neurological:  Negative for dizziness, headaches and numbness.  Hematological:  Does not bruise/bleed easily.  Psychiatric/Behavioral:  Negative for depression, sleep disturbance and suicidal ideas. The patient is not nervous/anxious.   All other systems  reviewed and are negative.    VITALS:   Blood pressure 134/68, pulse (!) 51, temperature 97.7 F (36.5 C), resp. rate 16, weight 216 lb 3.2 oz (98.1 kg), SpO2 95%.  Wt Readings from Last 3 Encounters:  02/28/23 216 lb 3.2 oz (98.1 kg)  11/28/22 212 lb 3.2 oz (96.3 kg)  11/23/22 208 lb 8 oz (94.6 kg)    Body mass index is 30.15 kg/m.  Performance status (ECOG): 1 - Symptomatic but completely ambulatory  PHYSICAL EXAM:   Physical Exam Vitals and nursing note reviewed. Exam conducted with a chaperone present.  Constitutional:      Appearance: Normal appearance.  Cardiovascular:     Rate and Rhythm: Normal rate and regular rhythm.     Pulses: Normal pulses.     Heart sounds: Normal heart sounds.  Pulmonary:     Effort: Pulmonary effort is normal.     Breath sounds: Normal breath sounds.  Abdominal:     Palpations: Abdomen is soft. There is no hepatomegaly, splenomegaly or mass.     Tenderness: There is no abdominal tenderness.  Musculoskeletal:     Right lower leg: No edema.     Left lower leg: No edema.  Lymphadenopathy:     Cervical: No cervical adenopathy.     Right cervical: No superficial, deep or posterior cervical adenopathy.    Left cervical: No superficial, deep or posterior cervical adenopathy.     Upper Body:     Right upper body: No supraclavicular or axillary adenopathy.     Left upper body: No supraclavicular or  axillary adenopathy.  Neurological:     General: No focal deficit present.     Mental Status: He is alert and oriented to person, place, and time.  Psychiatric:        Mood and Affect: Mood normal.        Behavior: Behavior normal.     LABS:      Latest Ref Rng & Units 02/21/2023    9:52 AM 11/16/2022    2:45 PM 08/16/2022    2:34 PM  CBC  WBC 4.0 - 10.5 K/uL 7.2  6.6  7.0   Hemoglobin 13.0 - 17.0 g/dL 78.2  95.6  21.3   Hematocrit 39.0 - 52.0 % 37.8  36.4  38.0   Platelets 150 - 400 K/uL 172  145  178       Latest Ref Rng & Units  02/21/2023    9:52 AM 11/16/2022    2:45 PM 08/16/2022    2:34 PM  CMP  Glucose 70 - 99 mg/dL 086  578  469   BUN 8 - 23 mg/dL 21  22  28    Creatinine 0.61 - 1.24 mg/dL 6.29  5.28  4.13   Sodium 135 - 145 mmol/L 134  136  138   Potassium 3.5 - 5.1 mmol/L 4.0  4.5  4.5   Chloride 98 - 111 mmol/L 103  105  104   CO2 22 - 32 mmol/L 23  23  23    Calcium 8.9 - 10.3 mg/dL 9.3  9.1  9.9   Total Protein 6.5 - 8.1 g/dL 6.6  6.5  7.0   Total Bilirubin <1.2 mg/dL 0.8  0.5  0.5   Alkaline Phos 38 - 126 U/L 68  72  84   AST 15 - 41 U/L 25  26  26    ALT 0 - 44 U/L 23  20  25       Lab Results  Component Value Date   CEA1 6.3 (H) 02/21/2023   CEA 1.8 03/07/2007   /  CEA  Date Value Ref Range Status  02/21/2023 6.3 (H) 0.0 - 4.7 ng/mL Final    Comment:    (NOTE)                             Nonsmokers          <3.9                             Smokers             <5.6 [employer] Electrochemiluminescence Immunoassay (ECLIA) Values obtained with different assay methods or kits cannot be used interchangeably.  Results cannot be interpreted as absolute evidence of the presence or absence of malignant disease. Performed At: [facility] [address] [place], [place] [id number] Jolene Schimke MD ZD:[id number]   03/07/2007 1.8  Final   No results found for: "PSA1" No results found for: "VZD638" No results found for: "CAN125"  Lab Results  Component Value Date   TOTALPROTELP 6.3 03/08/2021   ALBUMINELP 3.8 03/08/2021   A1GS 0.2 03/08/2021   A2GS 0.7 03/08/2021   BETS 0.8 03/08/2021   GAMS 0.8 03/08/2021   MSPIKE Not Observed 03/08/2021   SPEI Comment 03/08/2021   Lab Results  Component Value Date   TIBC 316 02/21/2023   TIBC 297 11/16/2022   TIBC 322 08/16/2022   FERRITIN 39  02/21/2023   FERRITIN 35 11/16/2022   FERRITIN 48 08/16/2022   IRONPCTSAT 20 02/21/2023   IRONPCTSAT 18 11/16/2022   IRONPCTSAT 14 (L) 08/16/2022   Lab Results  Component Value Date    LDH 169 06/19/2007     STUDIES:   CT ABDOMEN PELVIS W CONTRAST  Result Date: 02/22/2023 CLINICAL DATA:  Colon cancer stage II/III, monitor. * Tracking Code: BO *. EXAM: CT ABDOMEN AND PELVIS WITH CONTRAST TECHNIQUE: Multidetector CT imaging of the abdomen and pelvis was performed using the standard protocol following bolus administration of intravenous contrast. RADIATION DOSE REDUCTION: This exam was performed according to the departmental dose-optimization program which includes automated exposure control, adjustment of the mA and/or kV according to patient size and/or use of iterative reconstruction technique. CONTRAST:  OMNIPAQUE IOHEXOL 300 MG/ML  SOLN COMPARISON:  Multiple priors including most recent CT Aug 16, 2022 FINDINGS: Lower chest: Similar pericardial effusion. Hepatobiliary: Numerous bilobar hepatic hypodensities previously characterized as hepatic cysts and benign hemangiomas on prior MRI are stable. Gallbladder surgically absent. No biliary ductal dilation. Pancreas: No pancreatic ductal dilation or evidence of acute inflammation. Simple appearing 14 mm cyst in the pancreatic body on image 27/2 is unchanged. Spleen: No splenomegaly. Adrenals/Urinary Tract: Bilateral adrenal glands appear normal. No hydronephrosis. Nonobstructive 2 mm left renal stone. No suspicious renal mass. Urinary bladder is unremarkable for degree of distension. Stomach/Bowel: Radiopaque enteric contrast material traverses distal loops of small bowel. Stomach is unremarkable for degree of distension. No pathologic dilation of small or large bowel. Colonic diverticulosis without findings of acute diverticulitis. Surgical changes of prior right hemicolectomy with ileocolic anastomosis. No suspicious nodularity along the suture line. Vascular/Lymphatic: Aortic atherosclerosis. Smooth IVC contours. The portal, splenic and superior mesenteric veins are patent. Right common iliac lymph node measures 10 mm in short axis  on image 60/2 previously 11 mm. Right lower quadrant mesenteric lymph node measures 11 mm in short axis on image 52/2, previously 10 mm. Lymph node in the central pelvis measures 16 mm in short axis on image 62/2 previously 11 mm. Reproductive: Brachytherapy seeds in the prostate Other: No significant abdominopelvic free fluid. Musculoskeletal: No aggressive lytic or blastic lesion of bone. Multilevel degenerative change of the spine. IMPRESSION: 1. Slight interval increase in size of mesenteric lymph nodes. 2. Surgical changes of prior right hemicolectomy with ileocolic anastomosis. No suspicious nodularity along the suture line. 3. Stable simple appearing 14 mm cyst in the pancreatic body, likely a side branch IPMN. Suggest attention on follow-up imaging. 4. Nonobstructive 2 mm left renal stone. 5. Colonic diverticulosis without findings of acute diverticulitis. 6. Similar pericardial effusion. 7.  Aortic Atherosclerosis (ICD10-I70.0). Electronically Signed   By: Maudry Mayhew M.D.   On: 02/22/2023 11:02

## 2023-02-28 ENCOUNTER — Inpatient Hospital Stay: Payer: Medicare HMO | Attending: Hematology | Admitting: Hematology

## 2023-02-28 VITALS — BP 134/68 | HR 51 | Temp 97.7°F | Resp 16 | Wt 216.2 lb

## 2023-02-28 DIAGNOSIS — Z85038 Personal history of other malignant neoplasm of large intestine: Secondary | ICD-10-CM | POA: Insufficient documentation

## 2023-02-28 DIAGNOSIS — Z923 Personal history of irradiation: Secondary | ICD-10-CM | POA: Insufficient documentation

## 2023-02-28 DIAGNOSIS — D509 Iron deficiency anemia, unspecified: Secondary | ICD-10-CM | POA: Diagnosis not present

## 2023-02-28 DIAGNOSIS — Z8546 Personal history of malignant neoplasm of prostate: Secondary | ICD-10-CM | POA: Insufficient documentation

## 2023-02-28 DIAGNOSIS — Z79899 Other long term (current) drug therapy: Secondary | ICD-10-CM | POA: Diagnosis not present

## 2023-02-28 DIAGNOSIS — C184 Malignant neoplasm of transverse colon: Secondary | ICD-10-CM | POA: Diagnosis not present

## 2023-02-28 NOTE — Patient Instructions (Addendum)
[facility] at [facility] Discharge Instructions   You were seen and examined today by Dr. Ellin Saba.  He reviewed the results of your lab work which are normal/stable.   He reviewed the results of your CT scan. It is showing a couple of small lymph nodes in the abdomen have slightly grown in size. We will repeat the scan in 6 months.   We will see you back in 3 months. We will repeat lab work prior to this visit.   Return as scheduled.    Thank you for choosing [facility] at [facility] to provide your oncology and hematology care.  To afford each patient quality time with our provider, please arrive at least 15 minutes before your scheduled appointment time.   If you have a lab appointment with the Cancer Center please come in thru the Main Entrance and check in at the main information desk.  You need to re-schedule your appointment should you arrive 10 or more minutes late.  We strive to give you quality time with our providers, and arriving late affects you and other patients whose appointments are after yours.  Also, if you no show three or more times for appointments you may be dismissed from the clinic at the providers discretion.     Again, thank you for choosing [facility].  Our hope is that these requests will decrease the amount of time that you wait before being seen by our physicians.       _____________________________________________________________  Should you have questions after your visit to [facility], please contact our office at [phone] and follow the prompts.  Our office hours are 8:00 a.m. and 4:30 p.m. Monday - Friday.  Please note that voicemails left after 4:00 p.m. may not be returned until the following business day.  We are closed weekends and major holidays.  You do have access to a nurse 24-7, just call the main number to the clinic [phone] and do not press any options, hold  on the line and a nurse will answer the phone.    For prescription refill requests, have your pharmacy contact our office and allow 72 hours.    Due to Covid, you will need to wear a mask upon entering the hospital. If you do not have a mask, a mask will be given to you at the Main Entrance upon arrival. For doctor visits, patients may have 1 support person age 81 or older with them. For treatment visits, patients can not have anyone with them due to social distancing guidelines and our immunocompromised population.

## 2023-03-12 ENCOUNTER — Other Ambulatory Visit: Payer: Self-pay | Admitting: Cardiology

## 2023-03-15 DIAGNOSIS — Z6829 Body mass index (BMI) 29.0-29.9, adult: Secondary | ICD-10-CM | POA: Diagnosis not present

## 2023-03-15 DIAGNOSIS — I4891 Unspecified atrial fibrillation: Secondary | ICD-10-CM | POA: Diagnosis not present

## 2023-03-15 DIAGNOSIS — E1169 Type 2 diabetes mellitus with other specified complication: Secondary | ICD-10-CM | POA: Diagnosis not present

## 2023-03-15 DIAGNOSIS — G2581 Restless legs syndrome: Secondary | ICD-10-CM | POA: Diagnosis not present

## 2023-03-15 DIAGNOSIS — D509 Iron deficiency anemia, unspecified: Secondary | ICD-10-CM | POA: Diagnosis not present

## 2023-03-15 DIAGNOSIS — J301 Allergic rhinitis due to pollen: Secondary | ICD-10-CM | POA: Diagnosis not present

## 2023-03-15 DIAGNOSIS — I10 Essential (primary) hypertension: Secondary | ICD-10-CM | POA: Diagnosis not present

## 2023-03-15 DIAGNOSIS — D692 Other nonthrombocytopenic purpura: Secondary | ICD-10-CM | POA: Diagnosis not present

## 2023-03-15 DIAGNOSIS — I872 Venous insufficiency (chronic) (peripheral): Secondary | ICD-10-CM | POA: Diagnosis not present

## 2023-03-15 DIAGNOSIS — C189 Malignant neoplasm of colon, unspecified: Secondary | ICD-10-CM | POA: Diagnosis not present

## 2023-03-15 DIAGNOSIS — E7849 Other hyperlipidemia: Secondary | ICD-10-CM | POA: Diagnosis not present

## 2023-03-15 DIAGNOSIS — M1000 Idiopathic gout, unspecified site: Secondary | ICD-10-CM | POA: Diagnosis not present

## 2023-04-02 IMAGING — MR MR ABDOMEN WO/W CM
19 series · 48 of 48 positions shown · IV contrast (gadavist)
Comparison: None.

CLINICAL DATA: Transverse colon cancer.  For staging.

EXAM:
MRI ABDOMEN AND PELVIS WITHOUT AND WITH CONTRAST
TECHNIQUE: Multiplanar multisequence MR imaging of the abdomen and pelvis was
performed both before and after the administration of intravenous
contrast.
CONTRAST:  10mL GADAVIST GADOBUTROL 1 MMOL/ML IV SOLN

[Series 4: T2 · coronal · 6.0mm · 1.95mm/px · 2 of 33 slices shown (1 of 2)]
[im 1/33]
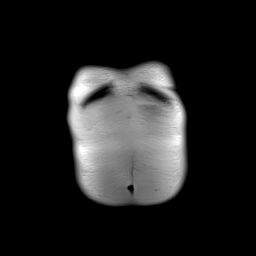
[im 33/33]
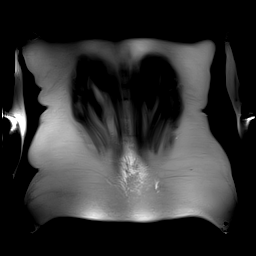

[Series 6: T2 fat-sat · 2 of 4 slices shown (1 of 2)]
[im 1/4]
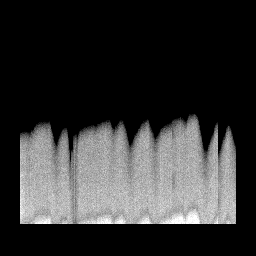
[im 4/4]
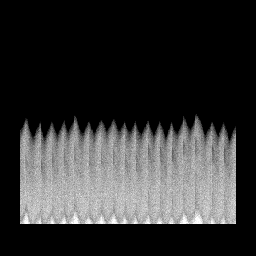

[Series 7: T2 fat-sat · axial · 6.0mm · 1.56mm/px · z∈[-134,+146]mm · 2 of 40 slices shown (2 of 2)]
[im 1/40]
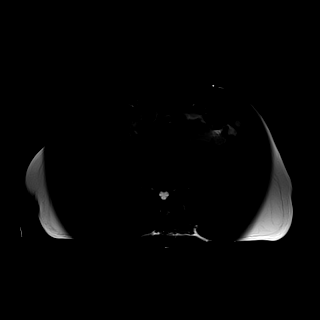
[im 40/40]
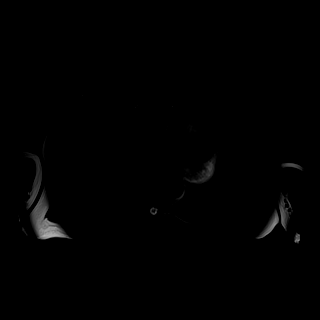

[Series 9: T1 · axial · 3.0mm · 1.41mm/px · z∈[-142,+142]mm · 3 of 96 slices shown (1 of 2)]
[im 1/96]
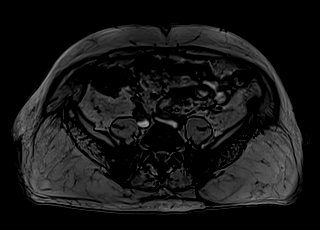
[im 48/96]
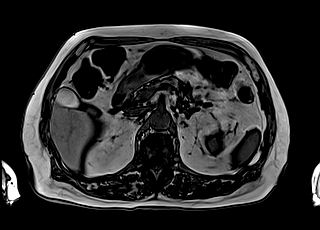
[im 96/96]
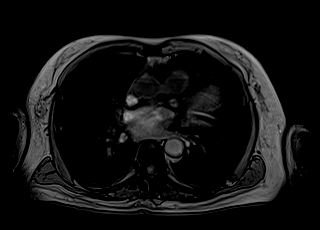

[Series 10: T1 · axial · 3.0mm · 1.41mm/px · z∈[-142,+142]mm · 3 of 96 slices shown (2 of 2)]
[im 1/96]
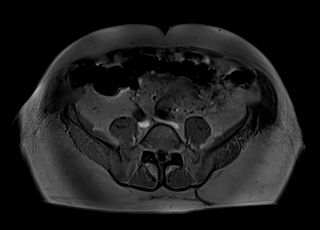
[im 48/96]
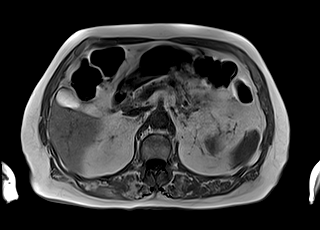
[im 96/96]
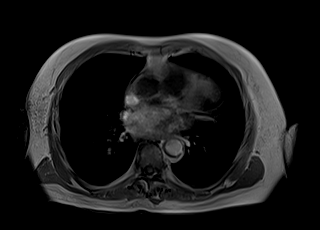

[Series 11: DWI · axial · 6.0mm · 1.68mm/px · z∈[-127,+154]mm · 3 of 80 slices shown (1 of 2)]
[im 1/80]
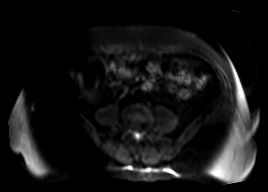
[im 40/80]
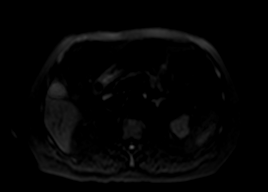
[im 80/80]
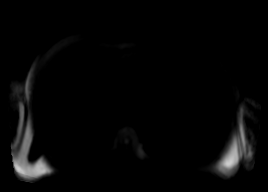

[Series 12: DWI · axial · 6.0mm · 1.68mm/px · 1 of 40 slices shown (2 of 2)]
[im 1/40]
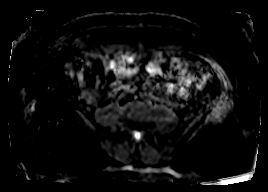

[Series 13: bSSFP · axial · 4.0mm · 0.88mm/px · z∈[-144,+144]mm · 2 of 73 slices shown]
[im 1/73]
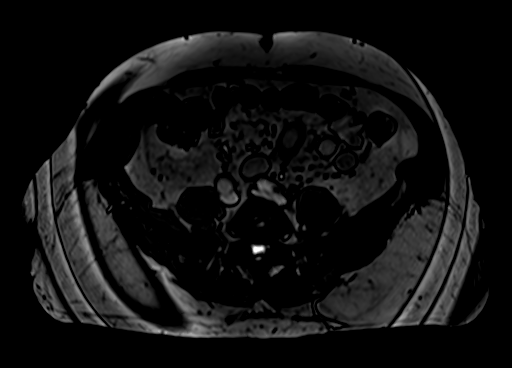
[im 73/73]
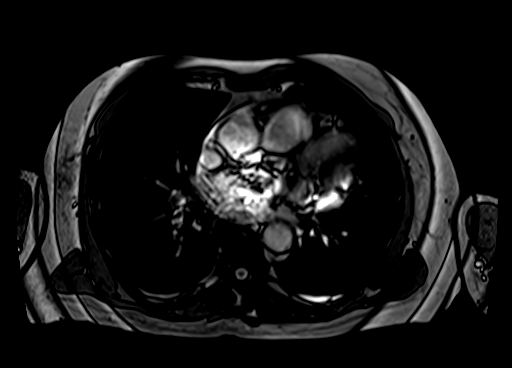

[Series 15: T1 dynamic · axial · 3.0mm · 1.41mm/px · z∈[-144,+141]mm · 3 of 96 slices shown (1 of 10)]
[im 1/96]
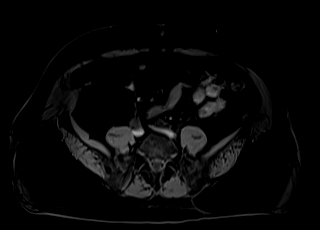
[im 48/96]
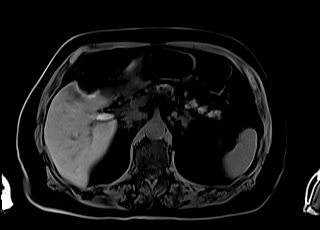
[im 96/96]
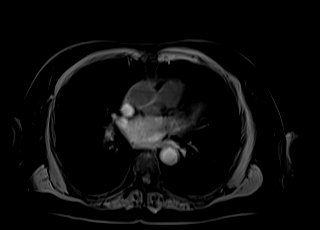

[Series 19: T1 dynamic · axial · 3.0mm · 1.41mm/px · z∈[-144,+141]mm · 3 of 96 slices shown (2 of 10)]
[im 1/96]
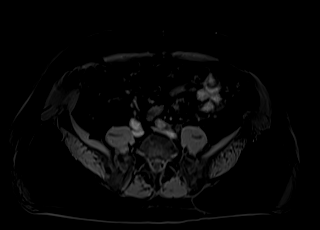
[im 48/96]
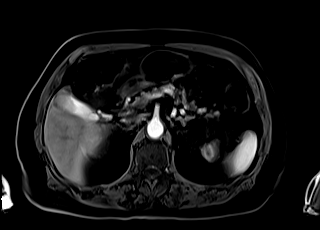
[im 96/96]
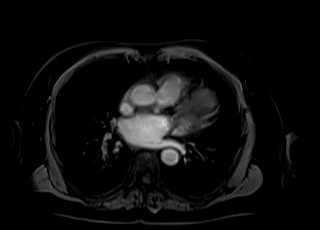

[Series 20: T1 dynamic · axial · 3.0mm · 1.41mm/px · z∈[-144,+141]mm · 3 of 96 slices shown (3 of 10)]
[im 1/96]
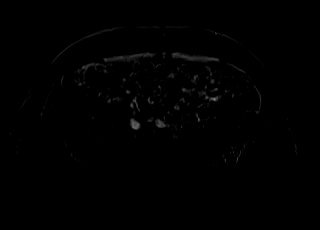
[im 48/96]
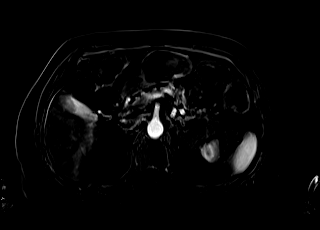
[im 96/96]
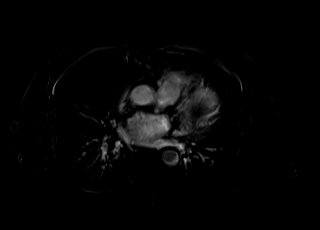

[Series 23: T1 dynamic · axial · 3.0mm · 1.41mm/px · z∈[-144,+141]mm · 3 of 96 slices shown (4 of 10)]
[im 1/96]
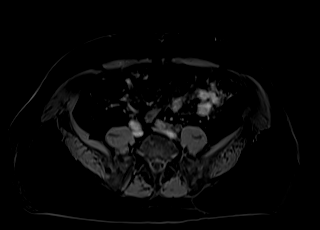
[im 48/96]
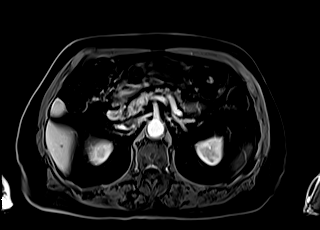
[im 96/96]
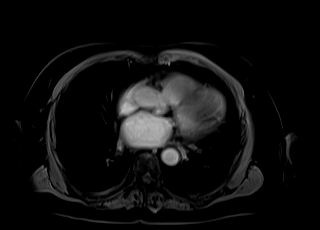

[Series 24: T1 dynamic · axial · 3.0mm · 1.41mm/px · z∈[-144,+141]mm · 3 of 96 slices shown (5 of 10)]
[im 1/96]
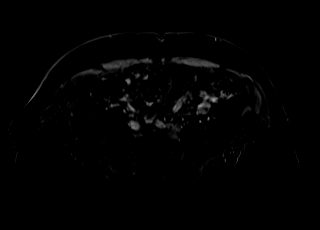
[im 48/96]
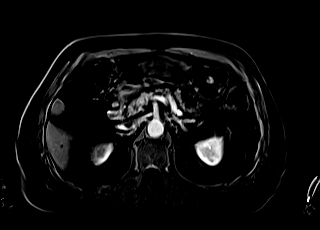
[im 96/96]
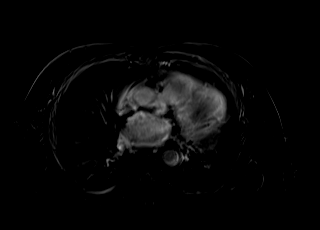

[Series 27: T1 dynamic · axial · 3.0mm · 1.41mm/px · z∈[-144,+141]mm · 3 of 96 slices shown (6 of 10)]
[im 1/96]
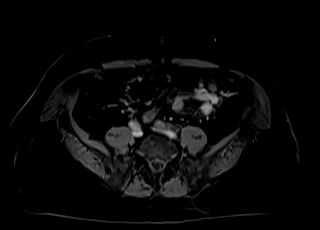
[im 48/96]
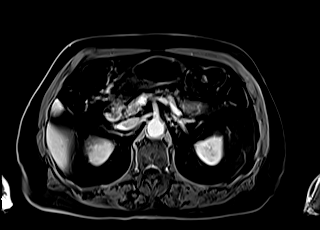
[im 96/96]
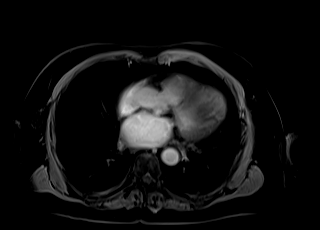

[Series 28: T1 dynamic · axial · 3.0mm · 1.41mm/px · z∈[-144,+141]mm · 3 of 96 slices shown (7 of 10)]
[im 1/96]
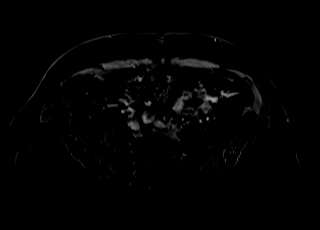
[im 48/96]
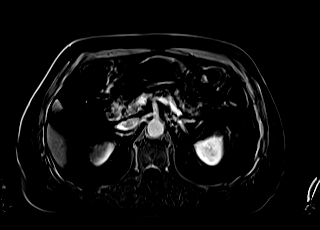
[im 96/96]
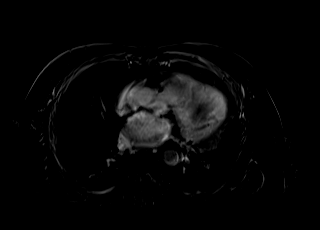

[Series 30: T1 dynamic · coronal · 5.0mm · 1.41mm/px · 2 of 52 slices shown (8 of 10)]
[im 1/52]
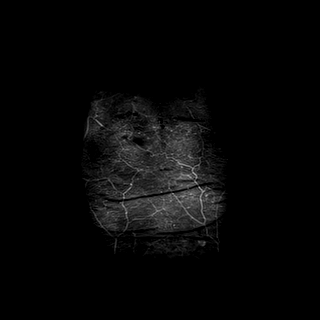
[im 52/52]
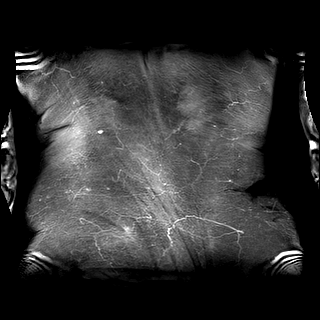

[Series 31: T2 · axial · 6.0mm · 1.76mm/px · 1 of 41 slices shown (2 of 2)]
[im 1/41]
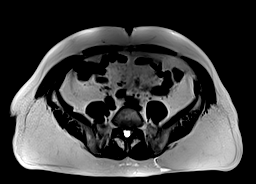

[Series 34: T1 dynamic · axial · 3.0mm · 1.41mm/px · z∈[-144,+141]mm · 3 of 96 slices shown (9 of 10)]
[im 1/96]
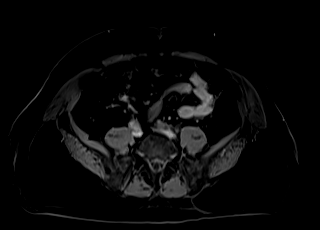
[im 48/96]
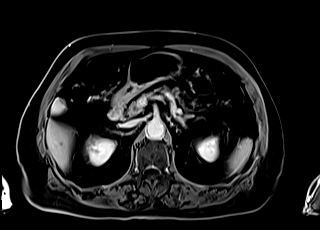
[im 96/96]
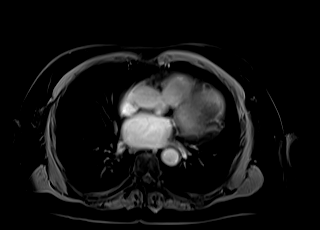

[Series 35: T1 dynamic · axial · 3.0mm · 1.41mm/px · z∈[-144,+141]mm · 3 of 96 slices shown (10 of 10)]
[im 1/96]
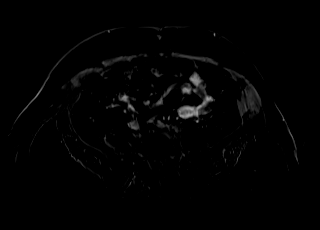
[im 48/96]
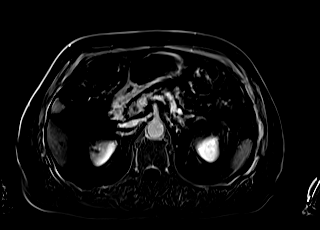
[im 96/96]
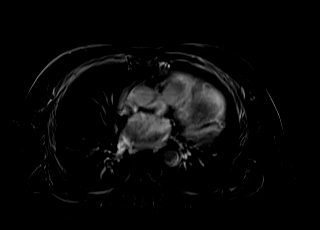

[48 of 48 positions shown; findings below may reference images not displayed]

FINDINGS: Motion degraded images.

Lower chest: Lung bases are clear.  Cardiomegaly.

Hepatobiliary: 17 mm hemangioma in segment 4A (series 31/image 13).
Scattered hepatic cysts measuring up to 22 mm in segment 2 (series
31/image 11). No suspicious/enhancing hepatic lesions.

Gallbladder is unremarkable. No intrahepatic or extrahepatic ductal
dilatation.

Pancreas: 10 mm unilocular cyst along the pancreatic body (series
7/image 19), likely benign. Given the patient's age, this is of
questionable clinical significance.

Spleen:  Within normal limits.

Adrenals/Urinary Tract:  Adrenal glands are within normal limits.

Kidneys are within normal limits.  No hydronephrosis.

Stomach/Bowel: Stomach is within normal limits.

No evidence of bowel obstruction.

Scattered colonic diverticulosis, without evidence of
diverticulitis. No colonic wall thickening or mass is evident on MR.

Vascular/Lymphatic:  No evidence of abdominal aortic aneurysm.

No suspicious abdominopelvic lymphadenopathy.

Reproductive: Prostate is unremarkable.

Other:  Trace right pelvic ascites (series 3/image 12).

Musculoskeletal: No focal osseous lesions.
IMPRESSION: Motion degraded images.

Scattered hepatic cysts and a 17 mm hemangioma in segment 4A,
benign. No suspicious hepatic lesions.

No findings suspicious for metastatic disease in the abdomen/pelvis.

Additional ancillary findings as above.

## 2023-04-03 DIAGNOSIS — I10 Essential (primary) hypertension: Secondary | ICD-10-CM | POA: Diagnosis not present

## 2023-04-03 DIAGNOSIS — N182 Chronic kidney disease, stage 2 (mild): Secondary | ICD-10-CM | POA: Diagnosis not present

## 2023-04-03 DIAGNOSIS — M79675 Pain in left toe(s): Secondary | ICD-10-CM | POA: Diagnosis not present

## 2023-04-03 DIAGNOSIS — M79672 Pain in left foot: Secondary | ICD-10-CM | POA: Diagnosis not present

## 2023-04-03 DIAGNOSIS — E1165 Type 2 diabetes mellitus with hyperglycemia: Secondary | ICD-10-CM | POA: Diagnosis not present

## 2023-04-03 DIAGNOSIS — I4891 Unspecified atrial fibrillation: Secondary | ICD-10-CM | POA: Diagnosis not present

## 2023-04-03 DIAGNOSIS — E1140 Type 2 diabetes mellitus with diabetic neuropathy, unspecified: Secondary | ICD-10-CM | POA: Diagnosis not present

## 2023-04-03 DIAGNOSIS — E7849 Other hyperlipidemia: Secondary | ICD-10-CM | POA: Diagnosis not present

## 2023-04-03 DIAGNOSIS — M79671 Pain in right foot: Secondary | ICD-10-CM | POA: Diagnosis not present

## 2023-04-03 DIAGNOSIS — M79674 Pain in right toe(s): Secondary | ICD-10-CM | POA: Diagnosis not present

## 2023-04-03 DIAGNOSIS — I739 Peripheral vascular disease, unspecified: Secondary | ICD-10-CM | POA: Diagnosis not present

## 2023-04-03 DIAGNOSIS — L110 Acquired keratosis follicularis: Secondary | ICD-10-CM | POA: Diagnosis not present

## 2023-04-03 DIAGNOSIS — Z0001 Encounter for general adult medical examination with abnormal findings: Secondary | ICD-10-CM | POA: Diagnosis not present

## 2023-04-03 DIAGNOSIS — D649 Anemia, unspecified: Secondary | ICD-10-CM | POA: Diagnosis not present

## 2023-04-03 DIAGNOSIS — I25119 Atherosclerotic heart disease of native coronary artery with unspecified angina pectoris: Secondary | ICD-10-CM | POA: Diagnosis not present

## 2023-04-17 DIAGNOSIS — H524 Presbyopia: Secondary | ICD-10-CM | POA: Diagnosis not present

## 2023-04-17 DIAGNOSIS — H35033 Hypertensive retinopathy, bilateral: Secondary | ICD-10-CM | POA: Diagnosis not present

## 2023-04-25 DIAGNOSIS — E113213 Type 2 diabetes mellitus with mild nonproliferative diabetic retinopathy with macular edema, bilateral: Secondary | ICD-10-CM | POA: Diagnosis not present

## 2023-05-25 DIAGNOSIS — E113213 Type 2 diabetes mellitus with mild nonproliferative diabetic retinopathy with macular edema, bilateral: Secondary | ICD-10-CM | POA: Diagnosis not present

## 2023-05-26 ENCOUNTER — Other Ambulatory Visit: Payer: Self-pay | Admitting: Cardiology

## 2023-05-30 ENCOUNTER — Inpatient Hospital Stay: Payer: Medicare Other | Attending: Hematology

## 2023-05-30 DIAGNOSIS — D509 Iron deficiency anemia, unspecified: Secondary | ICD-10-CM

## 2023-05-30 DIAGNOSIS — D649 Anemia, unspecified: Secondary | ICD-10-CM | POA: Diagnosis not present

## 2023-05-30 DIAGNOSIS — C184 Malignant neoplasm of transverse colon: Secondary | ICD-10-CM

## 2023-05-30 DIAGNOSIS — Z8546 Personal history of malignant neoplasm of prostate: Secondary | ICD-10-CM | POA: Diagnosis present

## 2023-05-30 DIAGNOSIS — Z85038 Personal history of other malignant neoplasm of large intestine: Secondary | ICD-10-CM | POA: Insufficient documentation

## 2023-05-30 DIAGNOSIS — Z79899 Other long term (current) drug therapy: Secondary | ICD-10-CM | POA: Diagnosis not present

## 2023-05-30 LAB — IRON AND TIBC
Iron: 80 ug/dL (ref 45–182)
Saturation Ratios: 25 % (ref 17.9–39.5)
TIBC: 324 ug/dL (ref 250–450)
UIBC: 244 ug/dL

## 2023-05-30 LAB — CBC WITH DIFFERENTIAL/PLATELET
Abs Immature Granulocytes: 0.04 10*3/uL (ref 0.00–0.07)
Basophils Absolute: 0.1 10*3/uL (ref 0.0–0.1)
Basophils Relative: 1 %
Eosinophils Absolute: 0.1 10*3/uL (ref 0.0–0.5)
Eosinophils Relative: 1 %
HCT: 38.1 % — ABNORMAL LOW (ref 39.0–52.0)
Hemoglobin: 12.2 g/dL — ABNORMAL LOW (ref 13.0–17.0)
Immature Granulocytes: 1 %
Lymphocytes Relative: 17 %
Lymphs Abs: 1.2 10*3/uL (ref 0.7–4.0)
MCH: 30.2 pg (ref 26.0–34.0)
MCHC: 32 g/dL (ref 30.0–36.0)
MCV: 94.3 fL (ref 80.0–100.0)
Monocytes Absolute: 0.7 10*3/uL (ref 0.1–1.0)
Monocytes Relative: 10 %
Neutro Abs: 5.2 10*3/uL (ref 1.7–7.7)
Neutrophils Relative %: 70 %
Platelets: 175 10*3/uL (ref 150–400)
RBC: 4.04 MIL/uL — ABNORMAL LOW (ref 4.22–5.81)
RDW: 14 % (ref 11.5–15.5)
WBC: 7.3 10*3/uL (ref 4.0–10.5)
nRBC: 0 % (ref 0.0–0.2)

## 2023-05-30 LAB — COMPREHENSIVE METABOLIC PANEL
ALT: 23 U/L (ref 0–44)
AST: 27 U/L (ref 15–41)
Albumin: 4.1 g/dL (ref 3.5–5.0)
Alkaline Phosphatase: 74 U/L (ref 38–126)
Anion gap: 7 (ref 5–15)
BUN: 16 mg/dL (ref 8–23)
CO2: 22 mmol/L (ref 22–32)
Calcium: 9.6 mg/dL (ref 8.9–10.3)
Chloride: 105 mmol/L (ref 98–111)
Creatinine, Ser: 0.97 mg/dL (ref 0.61–1.24)
GFR, Estimated: >60 mL/min (ref >60)
Glucose, Bld: 208 mg/dL — ABNORMAL HIGH (ref 70–99)
Potassium: 4.3 mmol/L (ref 3.5–5.1)
Sodium: 134 mmol/L — ABNORMAL LOW (ref 135–145)
Total Bilirubin: 0.6 mg/dL (ref 0.0–1.2)
Total Protein: 7 g/dL (ref 6.5–8.1)

## 2023-05-30 LAB — FERRITIN: Ferritin: 34 ng/mL (ref 24–336)

## 2023-05-31 LAB — CEA: CEA: 5.9 ng/mL — ABNORMAL HIGH (ref 0.0–4.7)

## 2023-06-06 ENCOUNTER — Inpatient Hospital Stay: Payer: Medicare Other | Admitting: Hematology

## 2023-06-06 VITALS — BP 140/59 | HR 59 | Temp 98.2°F | Resp 16 | Wt 216.5 lb

## 2023-06-06 DIAGNOSIS — C184 Malignant neoplasm of transverse colon: Secondary | ICD-10-CM | POA: Diagnosis not present

## 2023-06-06 DIAGNOSIS — D509 Iron deficiency anemia, unspecified: Secondary | ICD-10-CM

## 2023-06-06 DIAGNOSIS — D649 Anemia, unspecified: Secondary | ICD-10-CM | POA: Diagnosis not present

## 2023-06-06 DIAGNOSIS — Z85038 Personal history of other malignant neoplasm of large intestine: Secondary | ICD-10-CM | POA: Diagnosis not present

## 2023-06-06 DIAGNOSIS — Z79899 Other long term (current) drug therapy: Secondary | ICD-10-CM | POA: Diagnosis not present

## 2023-06-06 NOTE — Progress Notes (Signed)
 [facility] [address] [place] [address]    Clinic Day:  06/06/2023  Referring physician: Richardean Chimera, MD  Patient Care Team: Richardean Chimera, MD as PCP - General (Unknown Physician Specialty) Jonelle Sidle, MD as PCP - Cardiology (Cardiology) Doreatha Massed, MD as Medical Oncologist (Medical Oncology)   ASSESSMENT & PLAN:   Assessment: 1. Colon cancer: - Colonoscopy on 10/12/2020 with 7 sessile polyps found in the transverse colon and cecum.  12 mm polyp found in the transverse colon, sessile.  Polyp was removed with piecemeal technique using cold snare.  2 sessile polyps found in the sigmoid colon, removed with cold snare. - Pathology cecal tubular adenoma, sessile serrated polyp of the transverse colon polypectomy, adenocarcinoma arising from a tubular adenoma with high-grade dysplasia of the transverse colon polypectomy.  Sigmoid colon polypectomy showed tubular adenoma, hyperplastic polyp. - Colonoscopy on 02/25/2021 with three 3 to 5 mm polyps in the transverse colon, removed with cold snare.  One 10 mm polyp in the descending colon at 54 cm proximal to the anus. - Pathology on 02/25/2021 shows invasive adenocarcinoma, poorly differentiated, grade 3 of the descending colon polypectomy.  CDX2 positive.  Other findings include transverse colon tubular adenoma and sessile serrated adenoma. - Right hemicolectomy on 05/20/2021. - Pathology shows grade 3 adenocarcinoma, mucinous features, 1.7 cm in the transverse colon, metastatic adenocarcinoma involving 2/29 lymph nodes.  No LVI/perineural invasion.  pT1, PN 1B. - Adjuvant Xeloda 1500 mg 2 weeks on/1 week off started on 07/06/2021.  Cycle 2 on 07/27/2021, 3 tablets twice daily.  Cycle 3 on 08/26/2021, 2 tablets twice daily 2 weeks on/1 week off, dose reduced secondary to HFSR.  Cycle 4 on 09/16/2021.  Cycle 5 Xeloda 1000 mg twice daily on 10/08/2021.  Completed cycle 8 on 01/30/2022.  2. Social/family  history: He lives at home with his wife.  He quit smoking 23 years ago.  He smoked 1 pack/day for 7 years.  He worked in textile's prior to retirement.  He is active and walks 2 miles per day. - Father had prostate cancer.  Brother had lung cancer.  Another brother had colon cancer (? colon), Sister with lung cancer, another sister with cancer, patient unknown.  3.  Prostate cancer: - He was diagnosed with prostate cancer in August 2007, status post seed implants.  His PSA has been undetectable since then.    Plan: 1. Stage III (T1N1B) transverse colon adenocarcinoma: - CTAP (02/21/2023): Lymph node in the central pelvis measures 16 mm, previously 11 mm.  Right lower quadrant mesenteric lymph node measures 11 mm, previously 10 mm.  Stable 14 mm cyst in the pancreatic body.  Other benign findings discussed with the patient. - Denies any change in bowel habits.  Denies bleeding per rectum or melena. - Labs from 05/30/2023: Normal LFTs.  CBC was normal.  CEA improved to 5.9 from 6.3. - Recommend follow-up in 3 months with repeat CTAP and CEA levels.  If inconclusive, may consider Signatera test.   2.  Normocytic anemia: - His ferritin is 34, previously 39.  Hemoglobin 12.2 and stable. - Continue iron tablet twice daily.  Will repeat ferritin and iron panel at next visit.    No orders of the defined types were placed in this encounter.    Doreatha Massed, MD   3/12/202511:42 AM  CHIEF COMPLAINT:   Diagnosis: colon cancer    Cancer Staging  Colon cancer [facility]) Staging form: Colon and Rectum,  AJCC 8th Edition - Clinical stage from 03/22/2021: Stage I (cT1, cN0, cM0) - Unsigned - Pathologic stage from 06/23/2021: Stage IIIA (pT1, pN1b, cM0) - Unsigned    Prior Therapy: 1. Right hemicolectomy on 05/20/21 2. Adjuvant Xeloda 07/07/22 - 01/30/22  Current Therapy:  surveillance    HISTORY OF PRESENT ILLNESS:   Oncology History   No history exists.     INTERVAL HISTORY:   [name] [name]  is a 83 y.o. male seen for follow-up of colon cancer.  He was last seen by me 3 months ago.  Reports appetite 100% and energy level 75%.  PAST MEDICAL HISTORY:   Past Medical History: Past Medical History:  Diagnosis Date   Anemia    Arthritis    CAD (coronary artery disease)    DES to circumflex 12/2017   Colon cancer (HCC) 2023   Diabetes mellitus without complication (HCC)    Dysrhythmia    GERD (gastroesophageal reflux disease)    Glaucoma    Gout    Headache    History of kidney stones    Hypertension    NSTEMI (non-ST elevated myocardial infarction) (HCC) 12/31/2017   Paroxysmal atrial fibrillation (HCC)    Pericardial effusion    Idiopathic, recurrent pericardial effusion s/p pericardial window.   Prostate cancer (HCC) 2007   S/P Seed implants    Supraventricular tachycardia    Temporal arteritis [facility])     Surgical History: Past Surgical History:  Procedure Laterality Date   BIOPSY  10/12/2020   Procedure: BIOPSY;  Surgeon: Dolores Frame, MD;  [facility];  Service: Gastroenterology;;   BIOPSY  02/25/2021   Procedure: BIOPSY;  Surgeon: Dolores Frame, MD;  [facility];  Service: Gastroenterology;;   BIOPSY  04/11/2021   Procedure: BIOPSY;  Surgeon: Lemar Lofty., MD;  Location: [facility];  Service: Gastroenterology;;   CATARACT EXTRACTION W/PHACO Left 06/07/2015   Procedure: CATARACT EXTRACTION PHACO AND INTRAOCULAR LENS PLACEMENT LEFT EYE CDE=8.00;  Surgeon: Gemma Payor, MD;  Location: [facility];  Service: Ophthalmology;  Laterality: Left;   CATARACT EXTRACTION W/PHACO Right 06/17/2015   Procedure: CATARACT EXTRACTION PHACO AND INTRAOCULAR LENS PLACEMENT RIGHT EYE CDE=11.09;  Surgeon: Gemma Payor, MD;  Location: [facility];  Service: Ophthalmology;  Laterality: Right;   COLONOSCOPY WITH PROPOFOL N/A 10/12/2020   Procedure: COLONOSCOPY WITH PROPOFOL;  Surgeon: Dolores Frame, MD;  [facility];   Service: Gastroenterology;  Laterality: N/A;  10:35   COLONOSCOPY WITH PROPOFOL N/A 02/25/2021   Procedure: COLONOSCOPY WITH PROPOFOL;  Surgeon: Dolores Frame, MD;  [facility];  Service: Gastroenterology;  Laterality: N/A;  8:10   COLONOSCOPY WITH PROPOFOL N/A 07/24/2022   Procedure: COLONOSCOPY WITH PROPOFOL;  Surgeon: Meridee Score Netty Starring., MD;  Location: [facility];  Service: Gastroenterology;  Laterality: N/A;   CORONARY ANGIOPLASTY WITH STENT PLACEMENT  01/01/2018   CORONARY STENT INTERVENTION N/A 01/01/2018   Procedure: CORONARY STENT INTERVENTION;  Surgeon: Corky Crafts, MD;  [facility];  Service: Cardiovascular;  Laterality: N/A;   ENDOSCOPIC MUCOSAL RESECTION N/A 04/11/2021   Procedure: ENDOSCOPIC MUCOSAL RESECTION;  Surgeon: Meridee Score Netty Starring., MD;  [facility]: [facility];  Service: Gastroenterology;  Laterality: N/A;   ENTEROSCOPY N/A 10/12/2020   Procedure: PUSH ENTEROSCOPY;  Surgeon: Dolores Frame, MD;  [facility];  Service: Gastroenterology;  Laterality: N/A;   ENTEROSCOPY N/A 04/11/2021   Procedure: ENTEROSCOPY;  Surgeon: Meridee Score Netty Starring., MD;  [facility];  Service: Gastroenterology;  Laterality: N/A;   ENTEROSCOPY N/A 07/24/2022   Procedure: ENTEROSCOPY;  Surgeon: Meridee Score Netty Starring., MD;  Location: [facility];  Service: Gastroenterology;  Laterality: N/A;   ESOPHAGOGASTRODUODENOSCOPY (EGD) WITH PROPOFOL N/A 10/12/2020   Procedure: ESOPHAGOGASTRODUODENOSCOPY (EGD) WITH PROPOFOL;  Surgeon: Dolores Frame, MD;  [facility];  Service: Gastroenterology;  Laterality: N/A;   FRACTURE SURGERY Left 2010   ankle   HEMOSTASIS CLIP PLACEMENT  04/11/2021   Procedure: HEMOSTASIS CLIP PLACEMENT;  Surgeon: Lemar Lofty., MD;  [facility];  Service: Gastroenterology;;   INSERTION PROSTATE RADIATION SEED  2007   KNEE ARTHROSCOPY Left    LAPAROSCOPY  N/A 05/22/2021   Procedure: LAPAROSCOPY DIAGNOSTIC WITH WASHOUT OF ABDOMEN;  Surgeon: Romie Levee, MD;  Location: [facility];  Service: General;  Laterality: N/A;   LEFT HEART CATH AND CORONARY ANGIOGRAPHY N/A 01/01/2018   Procedure: LEFT HEART CATH AND CORONARY ANGIOGRAPHY;  Surgeon: Corky Crafts, MD;  [facility];  Service: Cardiovascular;  Laterality: N/A;   ORIF TIBIA & FIBULA FRACTURES Left 2003   Distal tibial/fibula    PERICARDIAL WINDOW  02/2007   PERICARDIOCENTESIS  2007   hx/notes 10/19/2011   POLYPECTOMY  10/12/2020   Procedure: POLYPECTOMY;  Surgeon: Dolores Frame, MD;  [facility];  Service: Gastroenterology;;  small bowel, cecal   POLYPECTOMY  02/25/2021   Procedure: POLYPECTOMY;  Surgeon: Dolores Frame, MD;  [facility];  Service: Gastroenterology;;  transverse colon x2   POLYPECTOMY  07/24/2022   Procedure: POLYPECTOMY;  Surgeon: Mansouraty, Netty Starring., MD;  Location: [facility];  Service: Gastroenterology;;   Brooke Dare INJECTION  04/11/2021   Procedure: SUBMUCOSAL LIFTING INJECTION;  Surgeon: Lemar Lofty., MD;  Location: [facility];  Service: Gastroenterology;;   SUBMUCOSAL TATTOO INJECTION  04/11/2021   Procedure: SUBMUCOSAL TATTOO INJECTION;  Surgeon: Lemar Lofty., MD;  Location: [facility];  Service: Gastroenterology;;    Social History: Social History   Socioeconomic History   Marital status: Married    Spouse name: Not on file   Number of children: Not on file   Years of education: Not on file   Highest education level: Not on file  Occupational History   Not on file  Tobacco Use   Smoking status: Former    Current packs/day: 0.00    Average packs/day: 0.3 packs/day for 10.0 years (3.0 ttl pk-yrs)    Types: Cigarettes    Start date: 08/06/1958    Quit date: 1968    Years since quitting: 57.2   Smokeless tobacco: Never  Vaping Use   Vaping status: Never  Used  Substance and Sexual Activity   Alcohol use: Not Currently   Drug use: Never   Sexual activity: Yes  Other Topics Concern   Not on file  Social History Narrative   Not on file   Social Drivers of Health   Financial Resource Strain: Not on file  Food Insecurity: Not on file  Transportation Needs: Not on file  Physical Activity: Not on file  Stress: Not on file  Social Connections: Not on file  Intimate Partner Violence: Not on file    Family History: Family History  Problem Relation Age of Onset   CAD Mother        MI in 13s   Prostate cancer Father    Prostate cancer Brother    Lung cancer Brother     Current Medications:  Current Outpatient Medications:  Acetaminophen (TYLENOL 8 HOUR PO), Take 2 tablets by mouth daily as needed., Disp: , Rfl:    allopurinol (ZYLOPRIM) 300 MG tablet, Take 450 mg by mouth in the morning., Disp: , Rfl:    aluminum-magnesium hydroxide 200-200 MG/5ML suspension, Take 5 mLs by mouth every 6 (six) hours as needed for indigestion., Disp: , Rfl:    Ascorbic Acid (VITAMIN C) 1000 MG tablet, Take 1,000 mg by mouth 2 (two) times daily., Disp: , Rfl:    atorvastatin (LIPITOR) 80 MG tablet, Take 1 tablet (80 mg total) by mouth daily., Disp: 90 tablet, Rfl: 3   benazepril (LOTENSIN) 40 MG tablet, Take 1 tablet by mouth once daily, Disp: 30 tablet, Rfl: 3   Blood Glucose Monitoring Suppl (ONETOUCH VERIO FLEX SYSTEM) w/Device KIT, USE 1 TO CHECK GLUCOSE ONCE DAILY, Disp: , Rfl:    brimonidine (ALPHAGAN) 0.2 % ophthalmic solution, 1 drop 2 (two) times daily., Disp: , Rfl:    cyanocobalamin 1000 MCG tablet, Take 1 tablet (1,000 mcg total) by mouth daily., Disp: , Rfl:    diltiazem (CARDIZEM CD) 240 MG 24 hr capsule, Take 240 mg by mouth daily., Disp: , Rfl:    dorzolamide (TRUSOPT) 2 % ophthalmic solution, Place 1 drop into both eyes 2 (two) times daily., Disp: , Rfl:    FERREX 150 150 MG capsule, Take 1 capsule (150 mg total) by mouth 2 (two)  times daily., Disp: 60 capsule, Rfl: 5   hydrALAZINE (APRESOLINE) 100 MG tablet, TAKE 1 TABLET BY MOUTH IN THE MORNING, 1/2 TABLET AT MIDDAY, AND 1 TABLET AT BEDTIME., Disp: 230 tablet, Rfl: 0   Lancets (ONETOUCH DELICA PLUS LANCET33G) MISC, SMARTSIG:1 Topical Daily, Disp: , Rfl:    loperamide (IMODIUM) 2 MG capsule, Take 2 mg by mouth as needed for diarrhea or loose stools., Disp: , Rfl:    metFORMIN (GLUCOPHAGE) 500 MG tablet, Take 1 tablet (500 mg total) by mouth 2 (two) times daily., Disp: 60 tablet, Rfl: 4   metoprolol succinate (TOPROL-XL) 25 MG 24 hr tablet, Take 1 tablet (25 mg total) by mouth daily., Disp: 30 tablet, Rfl: 3   Multiple Vitamin (MULTIVITAMIN) tablet, Take 1 tablet by mouth in the morning., Disp: , Rfl:    ONETOUCH ULTRA test strip, USE 1 STRIP TO CHECK GLUCOSE ONCE DAILY, Disp: , Rfl:    pantoprazole (PROTONIX) 40 MG tablet, Take 1 tablet (40 mg total) by mouth daily., Disp: 90 tablet, Rfl: 1   spironolactone (ALDACTONE) 25 MG tablet, Take 1/2 (one-half) tablet by mouth once daily, Disp: 45 tablet, Rfl: 1   apixaban (ELIQUIS) 5 MG TABS tablet, Take 1 tablet (5 mg total) by mouth 2 (two) times daily for 2 days., Disp: 60 tablet, Rfl: 6 No current facility-administered medications for this visit.  Facility-Administered Medications Ordered in Other Visits:    lactated ringers infusion, , Intravenous, Continuous PRN, Vanessa Floyd, CRNA, Stopped at 05/11/22 1355   Allergies: Allergies  Allergen Reactions   Amiodarone Rash    REVIEW OF SYSTEMS:   Review of Systems  Constitutional:  Negative for chills, fatigue and fever.  HENT:   Negative for lump/mass, mouth sores, nosebleeds, sore throat and trouble swallowing.   Eyes:  Negative for eye problems.  Respiratory:  Positive for cough. Negative for shortness of breath.   Cardiovascular:  Negative for chest pain, leg swelling and palpitations.  Gastrointestinal:  Negative for abdominal pain, constipation,  diarrhea, nausea and vomiting.  Genitourinary:  Negative for bladder incontinence, difficulty urinating,  dysuria, frequency, hematuria and nocturia.   Musculoskeletal:  Negative for arthralgias, back pain, flank pain, myalgias and neck pain.  Skin:  Negative for itching and rash.  Neurological:  Positive for numbness. Negative for dizziness and headaches.  Hematological:  Does not bruise/bleed easily.  Psychiatric/Behavioral:  Negative for depression, sleep disturbance and suicidal ideas. The patient is not nervous/anxious.   All other systems reviewed and are negative.    VITALS:   Blood pressure (!) 140/59, pulse (!) 59, temperature 98.2 F (36.8 C), temperature source Oral, resp. rate 16, weight 216 lb 7.9 oz (98.2 kg), SpO2 99%.  Wt Readings from Last 3 Encounters:  06/06/23 216 lb 7.9 oz (98.2 kg)  02/28/23 216 lb 3.2 oz (98.1 kg)  11/28/22 212 lb 3.2 oz (96.3 kg)    Body mass index is 30.19 kg/m.  Performance status (ECOG): 1 - Symptomatic but completely ambulatory  PHYSICAL EXAM:   Physical Exam Vitals and nursing note reviewed. Exam conducted with a chaperone present.  Constitutional:      Appearance: Normal appearance.  Cardiovascular:     Rate and Rhythm: Normal rate and regular rhythm.     Pulses: Normal pulses.     Heart sounds: Normal heart sounds.  Pulmonary:     Effort: Pulmonary effort is normal.     Breath sounds: Normal breath sounds.  Abdominal:     Palpations: Abdomen is soft. There is no hepatomegaly, splenomegaly or mass.     Tenderness: There is no abdominal tenderness.  Musculoskeletal:     Right lower leg: No edema.     Left lower leg: No edema.  Lymphadenopathy:     Cervical: No cervical adenopathy.     Right cervical: No superficial, deep or posterior cervical adenopathy.    Left cervical: No superficial, deep or posterior cervical adenopathy.     Upper Body:     Right upper body: No supraclavicular or axillary adenopathy.     Left upper  body: No supraclavicular or axillary adenopathy.  Neurological:     General: No focal deficit present.     Mental Status: He is alert and oriented to person, place, and time.  Psychiatric:        Mood and Affect: Mood normal.        Behavior: Behavior normal.     LABS:      Latest Ref Rng & Units 05/30/2023   11:02 AM 02/21/2023    9:52 AM 11/16/2022    2:45 PM  CBC  WBC 4.0 - 10.5 K/uL 7.3  7.2  6.6   Hemoglobin 13.0 - 17.0 g/dL 16.1  09.6  04.5   Hematocrit 39.0 - 52.0 % 38.1  37.8  36.4   Platelets 150 - 400 K/uL 175  172  145       Latest Ref Rng & Units 05/30/2023   11:02 AM 02/21/2023    9:52 AM 11/16/2022    2:45 PM  CMP  Glucose 70 - 99 mg/dL 409  811  914   BUN 8 - 23 mg/dL 16  21  22    Creatinine 0.61 - 1.24 mg/dL 7.82  9.56  2.13   Sodium 135 - 145 mmol/L 134  134  136   Potassium 3.5 - 5.1 mmol/L 4.3  4.0  4.5   Chloride 98 - 111 mmol/L 105  103  105   CO2 22 - 32 mmol/L 22  23  23    Calcium 8.9 - 10.3 mg/dL 9.6  9.3  9.1   Total Protein 6.5 - 8.1 g/dL 7.0  6.6  6.5   Total Bilirubin 0.0 - 1.2 mg/dL 0.6  0.8  0.5   Alkaline Phos 38 - 126 U/L 74  68  72   AST 15 - 41 U/L 27  25  26    ALT 0 - 44 U/L 23  23  20       Lab Results  Component Value Date   CEA1 5.9 (H) 05/30/2023   CEA 1.8 03/07/2007   /  CEA  Date Value Ref Range Status  05/30/2023 5.9 (H) 0.0 - 4.7 ng/mL Final    Comment:    (NOTE)                             Nonsmokers          <3.9                             Smokers             <5.6 [employer] Electrochemiluminescence Immunoassay (ECLIA) Values obtained with different assay methods or kits cannot be used interchangeably.  Results cannot be interpreted as absolute evidence of the presence or absence of malignant disease. Performed At: [facility] [address] [place], [place] [id number] Jolene Schimke MD [id number]   03/07/2007 1.8  Final   No results found for: "PSA1" No results found for: "CAN199" No  results found for: "CAN125"  Lab Results  Component Value Date   TOTALPROTELP 6.3 03/08/2021   ALBUMINELP 3.8 03/08/2021   A1GS 0.2 03/08/2021   A2GS 0.7 03/08/2021   BETS 0.8 03/08/2021   GAMS 0.8 03/08/2021   MSPIKE Not Observed 03/08/2021   SPEI Comment 03/08/2021   Lab Results  Component Value Date   TIBC 324 05/30/2023   TIBC 316 02/21/2023   TIBC 297 11/16/2022   FERRITIN 34 05/30/2023   FERRITIN 39 02/21/2023   FERRITIN 35 11/16/2022   IRONPCTSAT 25 05/30/2023   IRONPCTSAT 20 02/21/2023   IRONPCTSAT 18 11/16/2022   Lab Results  Component Value Date   LDH 169 06/19/2007     STUDIES:   No results found.

## 2023-06-06 NOTE — Patient Instructions (Signed)
 [facility] at [facility] Discharge Instructions   You were seen and examined today by Dr. Ellin Saba.  He reviewed the results of your lab work which are normal/stable.   We will see you back in 3 months. We will repeat lab work and a CT scan prior to this visit.  Return as scheduled.    Thank you for choosing [facility] at [facility] to provide your oncology and hematology care.  To afford each patient quality time with our provider, please arrive at least 15 minutes before your scheduled appointment time.   If you have a lab appointment with the Cancer Center please come in thru the Main Entrance and check in at the main information desk.  You need to re-schedule your appointment should you arrive 10 or more minutes late.  We strive to give you quality time with our providers, and arriving late affects you and other patients whose appointments are after yours.  Also, if you no show three or more times for appointments you may be dismissed from the clinic at the providers discretion.     Again, thank you for choosing [facility].  Our hope is that these requests will decrease the amount of time that you wait before being seen by our physicians.       _____________________________________________________________  Should you have questions after your visit to [facility], please contact our office at [phone] and follow the prompts.  Our office hours are 8:00 a.m. and 4:30 p.m. Monday - Friday.  Please note that voicemails left after 4:00 p.m. may not be returned until the following business day.  We are closed weekends and major holidays.  You do have access to a nurse 24-7, just call the main number to the clinic [phone] and do not press any options, hold on the line and a nurse will answer the phone.    For prescription refill requests, have your pharmacy contact our office and allow 72 hours.    Due to  Covid, you will need to wear a mask upon entering the hospital. If you do not have a mask, a mask will be given to you at the Main Entrance upon arrival. For doctor visits, patients may have 1 support person age 9 or older with them. For treatment visits, patients can not have anyone with them due to social distancing guidelines and our immunocompromised population.

## 2023-06-07 ENCOUNTER — Encounter: Payer: Self-pay | Admitting: Cardiology

## 2023-06-07 ENCOUNTER — Ambulatory Visit: Payer: Medicare HMO | Attending: Cardiology | Admitting: Cardiology

## 2023-06-07 VITALS — BP 128/62 | HR 64 | Ht 69.0 in | Wt 219.6 lb

## 2023-06-07 DIAGNOSIS — E782 Mixed hyperlipidemia: Secondary | ICD-10-CM | POA: Diagnosis not present

## 2023-06-07 DIAGNOSIS — I25119 Atherosclerotic heart disease of native coronary artery with unspecified angina pectoris: Secondary | ICD-10-CM | POA: Diagnosis not present

## 2023-06-07 DIAGNOSIS — I10 Essential (primary) hypertension: Secondary | ICD-10-CM | POA: Diagnosis not present

## 2023-06-07 DIAGNOSIS — I480 Paroxysmal atrial fibrillation: Secondary | ICD-10-CM

## 2023-06-07 MED ORDER — SPIRONOLACTONE 25 MG PO TABS: 12.5000 mg | ORAL_TABLET | Freq: Every day | ORAL | 1 refills | Status: DC

## 2023-06-07 MED ORDER — HYDRALAZINE HCL 100 MG PO TABS: 100.0000 mg | ORAL_TABLET | Freq: Three times a day (TID) | ORAL | 1 refills | Status: AC

## 2023-06-07 NOTE — Patient Instructions (Signed)
 Medication Instructions:   Refilled Spironolactone today Continue all other medications.     Labwork:  none  Testing/Procedures:  none  Follow-Up:  6 months   Any Other Special Instructions Will Be Listed Below (If Applicable).   If you need a refill on your cardiac medications before your next appointment, please call your pharmacy.

## 2023-06-07 NOTE — Progress Notes (Signed)
    Cardiology Office Note  Date: 06/07/2023   ID: [name] [name], DOB Aug 10, 1940, MRN [id number]  History of Present Illness: [name] [name] is an 83 y.o. male last seen in September 2024.  He is here today with his wife for a follow-up visit.  He does not report any exertional chest pain or increasing dyspnea exertion, no palpitations.  Has had some lower leg edema, left worse than right - compression stockings have been helpful, but he has not been using them consistently.  We went over his medications.  He reports compliance and no specific concerns.  Does need a refill for Aldactone 12.5 mg daily.  He does not report any spontaneous bleeding problems on Eliquis 5 mg twice daily.  I reviewed his recent lab work.  Physical Exam: VS:  BP 128/62   Pulse 64   Ht 5\' 9"  (1.753 m)   Wt 219 lb 9.6 oz (99.6 kg)   SpO2 96%   BMI 32.43 kg/m , BMI Body mass index is 32.43 kg/m.  Wt Readings from Last 3 Encounters:  06/07/23 219 lb 9.6 oz (99.6 kg)  06/06/23 216 lb 7.9 oz (98.2 kg)  02/28/23 216 lb 3.2 oz (98.1 kg)    General: Patient appears comfortable at rest. HEENT: Conjunctiva and lids normal. Neck: Supple, no elevated JVP or carotid bruits. Lungs: Clear to auscultation, nonlabored breathing at rest. Cardiac: Regular rate and rhythm, no S3, 2/6 systolic murmur. Extremities: Mild left lower leg edema, nontender.  ECG:  An ECG dated 07/25/2022 was personally reviewed today and demonstrated:  Sinus rhythm with right bundle branch block and left anterior fascicular block.  Labwork: 02/21/2023: Magnesium 1.9 05/30/2023: ALT 23; AST 27; BUN 16; Creatinine, Ser 0.97; Hemoglobin 12.2; Platelets 175; Potassium 4.3; Sodium 134   Other Studies Reviewed Today:  No interval cardiac testing for review today.  Assessment and Plan:  1.  Paroxysmal atrial fibrillation with CHA2DS2-VASc score of 3.  Symptomatically stable with no increasing palpitations.  Continue Eliquis 5 mg twice daily for  stroke prophylaxis.  He is also on Cardizem CD 240 mg daily and Toprol-XL 25 mg daily.  No changes were made today.   2.  CAD status post DES to the circumflex in 2019. LVEF 70 to 75% with severe LVH and mild diastolic dysfunction by follow-up echocardiogram in May 2024.  No active angina.  He is not on aspirin given use of Eliquis.  Continue Lipitor 80 mg daily.   3.  Mixed hyperlipidemia.  LDL 48 in November 2023.  Continue Lipitor 80 mg daily.   4.  Mild calcific aortic stenosis by echocardiogram in May 2024.  Asymptomatic.  No significant change in cardiac murmur.   5.  Primary hypertension.  Blood pressure is well-controlled today.  In addition to the above plan to continue Lotensin 40 mg daily, hydralazine 100 mg three times a day, and Aldactone 12.5 mg daily which will be refilled.  Disposition:  Follow up  6 months.  Signed, Jonelle Sidle, M.D., F.A.C.C. [facility] at [facility]

## 2023-06-11 DIAGNOSIS — M79674 Pain in right toe(s): Secondary | ICD-10-CM | POA: Diagnosis not present

## 2023-06-11 DIAGNOSIS — M79671 Pain in right foot: Secondary | ICD-10-CM | POA: Diagnosis not present

## 2023-06-11 DIAGNOSIS — I739 Peripheral vascular disease, unspecified: Secondary | ICD-10-CM | POA: Diagnosis not present

## 2023-06-11 DIAGNOSIS — E1140 Type 2 diabetes mellitus with diabetic neuropathy, unspecified: Secondary | ICD-10-CM | POA: Diagnosis not present

## 2023-06-11 DIAGNOSIS — M79672 Pain in left foot: Secondary | ICD-10-CM | POA: Diagnosis not present

## 2023-06-11 DIAGNOSIS — L110 Acquired keratosis follicularis: Secondary | ICD-10-CM | POA: Diagnosis not present

## 2023-06-11 DIAGNOSIS — M79675 Pain in left toe(s): Secondary | ICD-10-CM | POA: Diagnosis not present

## 2023-06-22 DIAGNOSIS — E113213 Type 2 diabetes mellitus with mild nonproliferative diabetic retinopathy with macular edema, bilateral: Secondary | ICD-10-CM | POA: Diagnosis not present

## 2023-06-28 DIAGNOSIS — E7849 Other hyperlipidemia: Secondary | ICD-10-CM | POA: Diagnosis not present

## 2023-06-28 DIAGNOSIS — Z1321 Encounter for screening for nutritional disorder: Secondary | ICD-10-CM | POA: Diagnosis not present

## 2023-06-28 DIAGNOSIS — I10 Essential (primary) hypertension: Secondary | ICD-10-CM | POA: Diagnosis not present

## 2023-06-28 DIAGNOSIS — N182 Chronic kidney disease, stage 2 (mild): Secondary | ICD-10-CM | POA: Diagnosis not present

## 2023-06-28 DIAGNOSIS — E1122 Type 2 diabetes mellitus with diabetic chronic kidney disease: Secondary | ICD-10-CM | POA: Diagnosis not present

## 2023-06-28 DIAGNOSIS — Z1329 Encounter for screening for other suspected endocrine disorder: Secondary | ICD-10-CM | POA: Diagnosis not present

## 2023-07-05 DIAGNOSIS — N182 Chronic kidney disease, stage 2 (mild): Secondary | ICD-10-CM | POA: Diagnosis not present

## 2023-07-05 DIAGNOSIS — I10 Essential (primary) hypertension: Secondary | ICD-10-CM | POA: Diagnosis not present

## 2023-07-05 DIAGNOSIS — E782 Mixed hyperlipidemia: Secondary | ICD-10-CM | POA: Diagnosis not present

## 2023-07-05 DIAGNOSIS — Z0000 Encounter for general adult medical examination without abnormal findings: Secondary | ICD-10-CM | POA: Diagnosis not present

## 2023-07-05 DIAGNOSIS — M1000 Idiopathic gout, unspecified site: Secondary | ICD-10-CM | POA: Diagnosis not present

## 2023-07-09 ENCOUNTER — Other Ambulatory Visit: Payer: Self-pay | Admitting: Cardiology

## 2023-08-12 IMAGING — CT CT CHEST-ABD-PELV W/ CM
3 of 5 series · 15 of 36 positions shown, 17 images · IV contrast (Omnipaque or Isovue)
Comparison: MRI abdomen/pelvis dated 11/06/2020

CLINICAL DATA: Newly diagnosed colon cancer

EXAM:
CT CHEST, ABDOMEN, AND PELVIS WITH CONTRAST
TECHNIQUE: Multidetector CT imaging of the chest, abdomen and pelvis was
performed following the standard protocol during bolus
administration of intravenous contrast.
CONTRAST:  100mL OMNIPAQUE IOHEXOL 300 MG/ML  SOLN

[Series 2: cap with · axial · 0.98mm/px · z∈[+794,+1334]mm · 10 of 133 slices shown, 12 images]
[im 13/133  mediastinal]
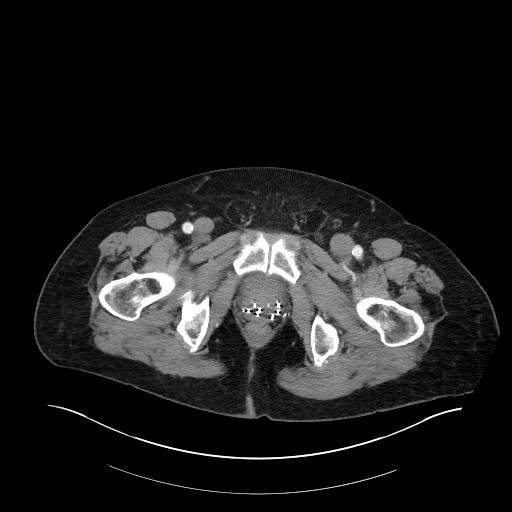
[im 13/133  bone]
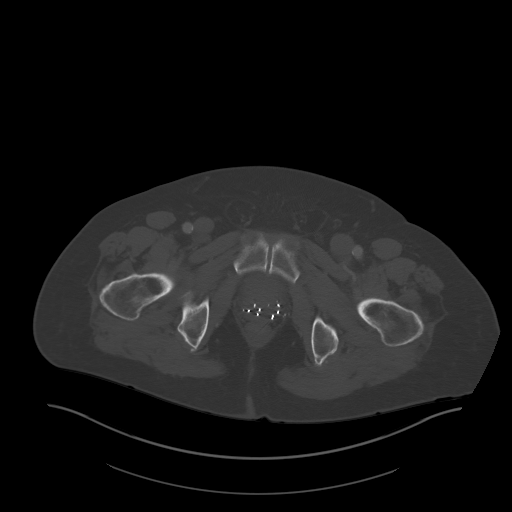
[im 25/133  mediastinal]
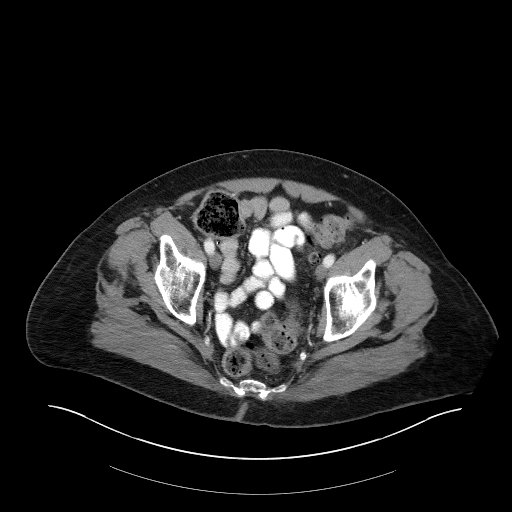
[im 37/133  mediastinal]
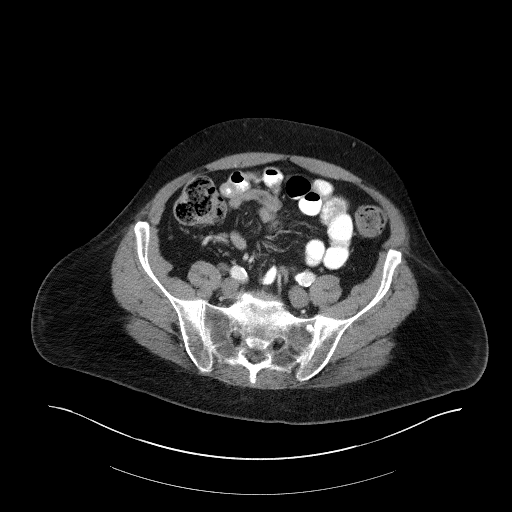
[im 49/133  mediastinal]
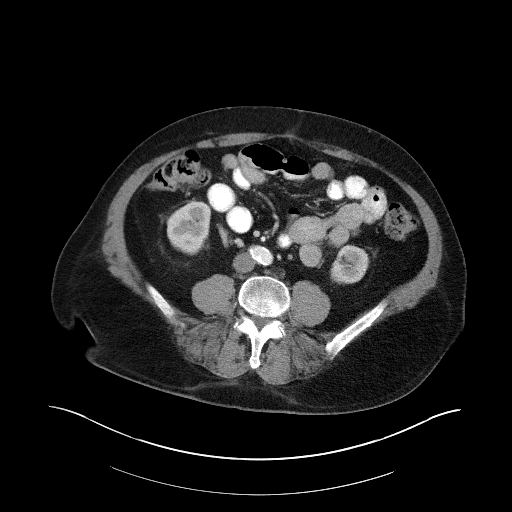
[im 61/133  mediastinal]
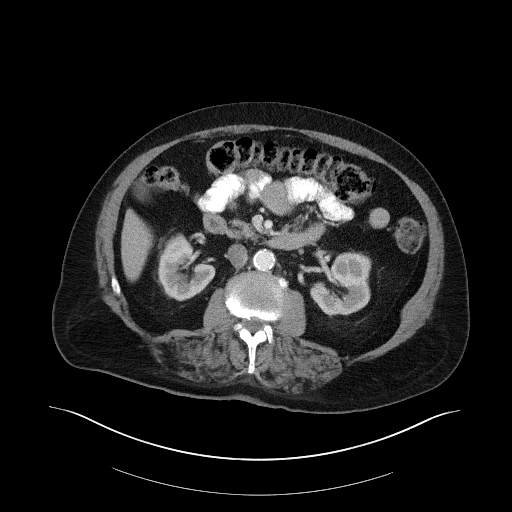
[im 73/133  mediastinal]
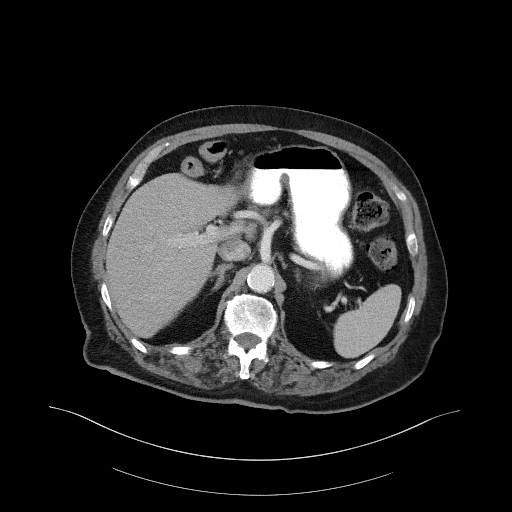
[im 85/133  mediastinal]
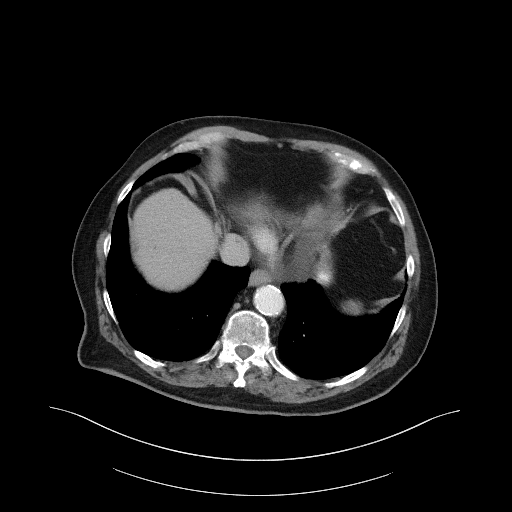
[im 97/133  mediastinal]
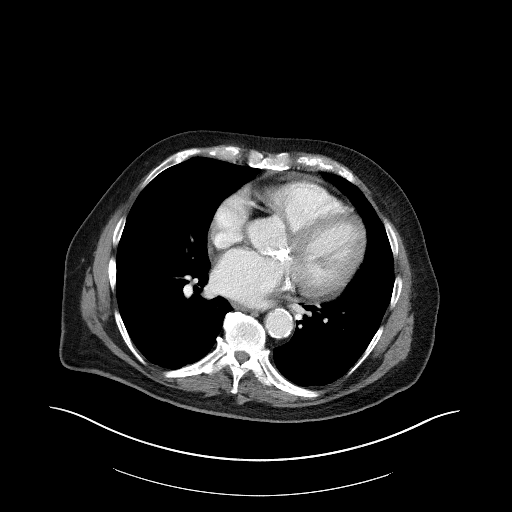
[im 109/133  mediastinal]
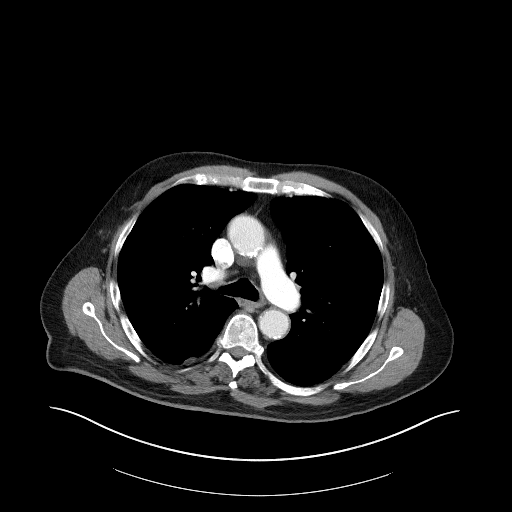
[im 109/133  bone]
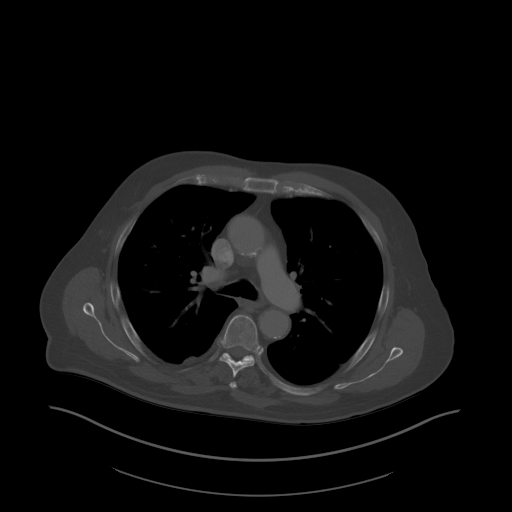
[im 121/133  mediastinal]
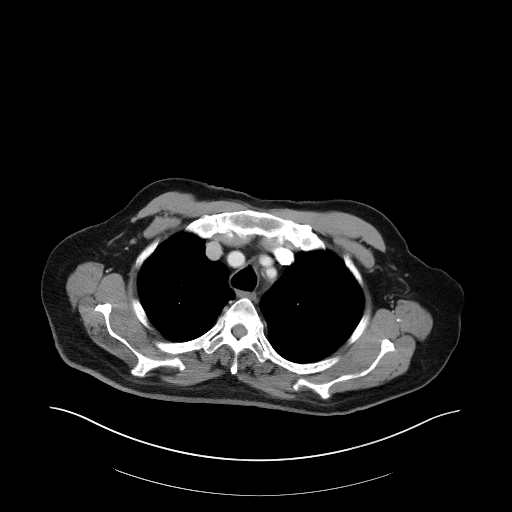

[Series 3: lung · axial · 0.98mm/px · z∈[+1118,+1168]mm · 2 of 151 slices shown]
[im 13/151  bone]
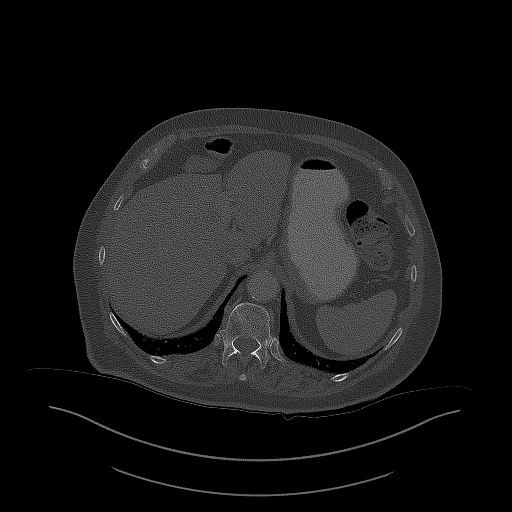
[im 38/151  bone]
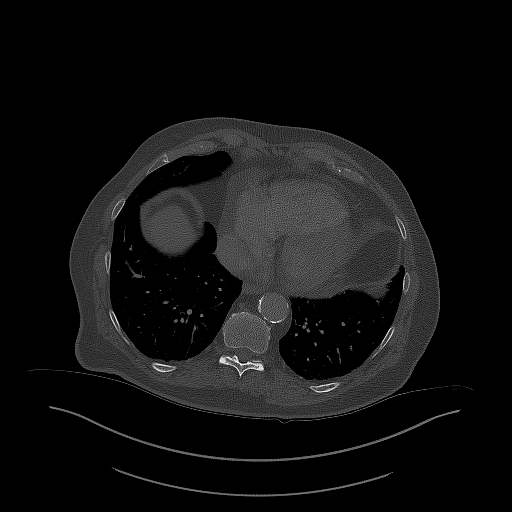

[Series 4: coronals · coronal · 0.90mm/px · 3 of 166 slices shown]
[im 34/166  mediastinal]
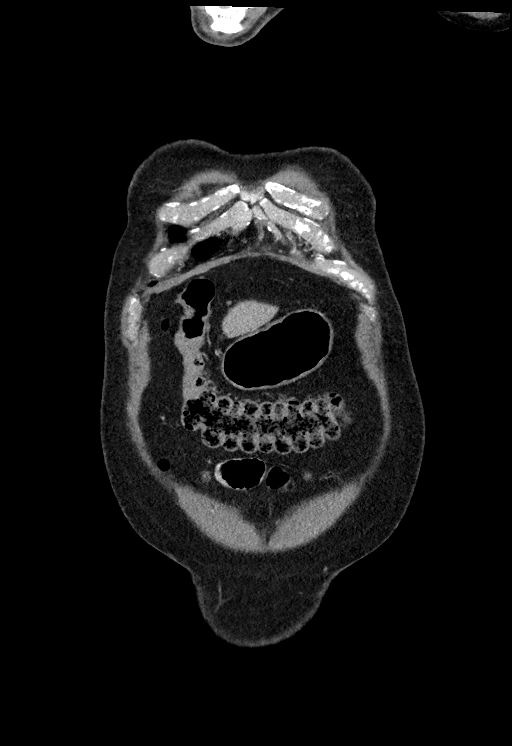
[im 67/166  mediastinal]
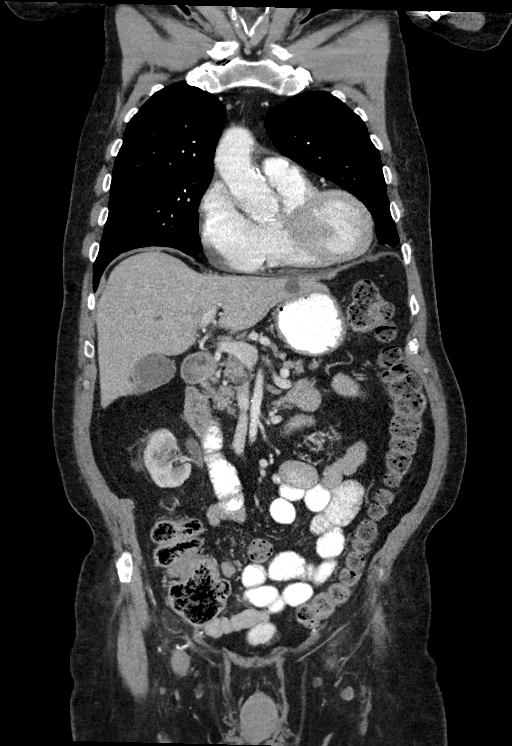
[im 100/166  mediastinal]
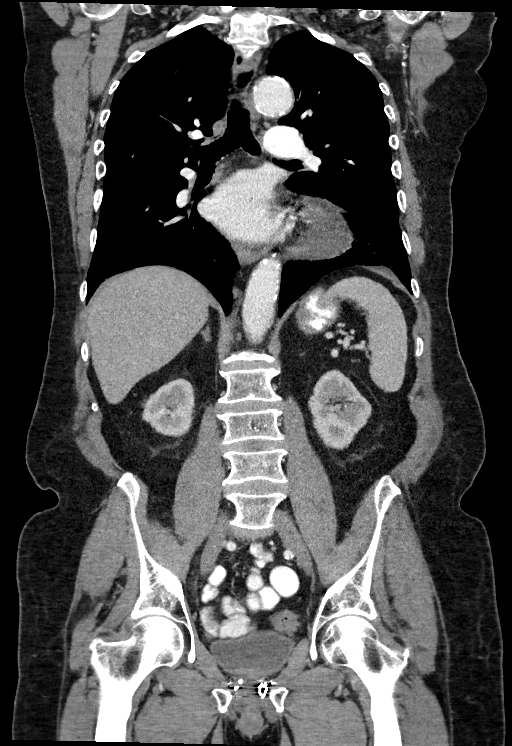

[15 of 36 positions shown; findings below may reference images not displayed]

FINDINGS: CT CHEST FINDINGS

Cardiovascular: Cardiomegaly.  Small pericardial effusion.

No evidence of thoracic aortic aneurysm. Atherosclerotic
calcifications of the aortic arch.

Three vessel coronary atherosclerosis.

Mediastinum/Nodes: No suspicious mediastinal lymphadenopathy.

Visualized thyroid is unremarkable.

Lungs/Pleura: Mild centrilobular and paraseptal emphysematous
changes, upper lung predominant.

5 mm triangular subpleural nodule in the posterior left lower lobe,
likely a benign subpleural lymph node, although technically
indeterminate.

No focal consolidation.

No pleural effusion or pneumothorax.

Musculoskeletal: Mild degenerative changes of the thoracic spine.

CT ABDOMEN PELVIS FINDINGS

Hepatobiliary: Scattered hepatic cysts, measuring up to 2.1 cm in
segment 2 (series 2/image 51). No suspicious/enhancing hepatic
lesions.

Gallbladder is unremarkable. No intrahepatic or extrahepatic ductal
dilatation.

Pancreas: Within normal limits.

Spleen: Within normal limits.

Adrenals/Urinary Tract: Adrenal glands are within normal limits.

Kidneys are within normal limits.  No hydronephrosis.

Bladder is underdistended but unremarkable.

Stomach/Bowel: Stomach is within normal limits.

No evidence of bowel obstruction.

Appendix is not discretely visualized.

No colonic wall thickening or mass is evident on CT. Specifically, a
polypoid lesion is not evident in the descending colon.

Vascular/Lymphatic: No evidence of abdominal aortic aneurysm.

Atherosclerotic calcifications of the abdominal aorta and branch
vessels.

No suspicious abdominopelvic lymphadenopathy.

Reproductive: Brachytherapy seeds in the prostate.

Other: No abdominopelvic ascites.

Mild fat in the bilateral inguinal canals.

Musculoskeletal: Mild degenerative changes of the lumbar spine.
IMPRESSION: No colonic wall thickening or mass is evident on CT.

No findings specific for metastatic disease.

5 mm triangular subpleural nodule in the posterior left lower lobe,
likely benign. Isolated pulmonary metastasis considered unlikely.
However, follow-up imaging is warranted. While [HOSPITAL]
guidelines do not apply in the setting of known malignancy, consider
follow-up CT chest in 6 months.

Additional ancillary findings as above.

## 2023-08-21 DIAGNOSIS — M79674 Pain in right toe(s): Secondary | ICD-10-CM | POA: Diagnosis not present

## 2023-08-21 DIAGNOSIS — M79671 Pain in right foot: Secondary | ICD-10-CM | POA: Diagnosis not present

## 2023-08-21 DIAGNOSIS — L110 Acquired keratosis follicularis: Secondary | ICD-10-CM | POA: Diagnosis not present

## 2023-08-21 DIAGNOSIS — E1140 Type 2 diabetes mellitus with diabetic neuropathy, unspecified: Secondary | ICD-10-CM | POA: Diagnosis not present

## 2023-08-21 DIAGNOSIS — M79672 Pain in left foot: Secondary | ICD-10-CM | POA: Diagnosis not present

## 2023-08-21 DIAGNOSIS — M79675 Pain in left toe(s): Secondary | ICD-10-CM | POA: Diagnosis not present

## 2023-08-21 DIAGNOSIS — I739 Peripheral vascular disease, unspecified: Secondary | ICD-10-CM | POA: Diagnosis not present

## 2023-09-06 ENCOUNTER — Inpatient Hospital Stay: Attending: Hematology

## 2023-09-06 ENCOUNTER — Ambulatory Visit (HOSPITAL_COMMUNITY)
Admission: RE | Admit: 2023-09-06 | Discharge: 2023-09-06 | Disposition: A | Discharge status: Home / Self Care | Source: Ambulatory Visit | Attending: Hematology | Admitting: Hematology

## 2023-09-06 DIAGNOSIS — Z85038 Personal history of other malignant neoplasm of large intestine: Secondary | ICD-10-CM | POA: Diagnosis not present

## 2023-09-06 DIAGNOSIS — C184 Malignant neoplasm of transverse colon: Secondary | ICD-10-CM | POA: Diagnosis not present

## 2023-09-06 DIAGNOSIS — Z8546 Personal history of malignant neoplasm of prostate: Secondary | ICD-10-CM | POA: Diagnosis present

## 2023-09-06 DIAGNOSIS — D649 Anemia, unspecified: Secondary | ICD-10-CM | POA: Diagnosis not present

## 2023-09-06 DIAGNOSIS — C189 Malignant neoplasm of colon, unspecified: Secondary | ICD-10-CM | POA: Diagnosis not present

## 2023-09-06 DIAGNOSIS — N200 Calculus of kidney: Secondary | ICD-10-CM | POA: Diagnosis not present

## 2023-09-06 DIAGNOSIS — D509 Iron deficiency anemia, unspecified: Secondary | ICD-10-CM

## 2023-09-06 DIAGNOSIS — Z87891 Personal history of nicotine dependence: Secondary | ICD-10-CM | POA: Insufficient documentation

## 2023-09-06 DIAGNOSIS — K862 Cyst of pancreas: Secondary | ICD-10-CM | POA: Diagnosis not present

## 2023-09-06 LAB — COMPREHENSIVE METABOLIC PANEL WITH GFR
ALT: 20 U/L (ref 0–44)
AST: 27 U/L (ref 15–41)
Albumin: 4 g/dL (ref 3.5–5.0)
Alkaline Phosphatase: 74 U/L (ref 38–126)
Anion gap: 6 (ref 5–15)
BUN: 20 mg/dL (ref 8–23)
CO2: 24 mmol/L (ref 22–32)
Calcium: 9.6 mg/dL (ref 8.9–10.3)
Chloride: 105 mmol/L (ref 98–111)
Creatinine, Ser: 0.94 mg/dL (ref 0.61–1.24)
GFR, Estimated: >60 mL/min (ref >60)
Glucose, Bld: 151 mg/dL — ABNORMAL HIGH (ref 70–99)
Potassium: 4.6 mmol/L (ref 3.5–5.1)
Sodium: 135 mmol/L (ref 135–145)
Total Bilirubin: 0.5 mg/dL (ref 0.0–1.2)
Total Protein: 7.2 g/dL (ref 6.5–8.1)

## 2023-09-06 LAB — CBC WITH DIFFERENTIAL/PLATELET
Abs Immature Granulocytes: 0.04 10*3/uL (ref 0.00–0.07)
Basophils Absolute: 0.1 10*3/uL (ref 0.0–0.1)
Basophils Relative: 1 %
Eosinophils Absolute: 0.1 10*3/uL (ref 0.0–0.5)
Eosinophils Relative: 2 %
HCT: 36.9 % — ABNORMAL LOW (ref 39.0–52.0)
Hemoglobin: 12 g/dL — ABNORMAL LOW (ref 13.0–17.0)
Immature Granulocytes: 1 %
Lymphocytes Relative: 16 %
Lymphs Abs: 1.1 10*3/uL (ref 0.7–4.0)
MCH: 31.3 pg (ref 26.0–34.0)
MCHC: 32.5 g/dL (ref 30.0–36.0)
MCV: 96.1 fL (ref 80.0–100.0)
Monocytes Absolute: 0.5 10*3/uL (ref 0.1–1.0)
Monocytes Relative: 7 %
Neutro Abs: 5.1 10*3/uL (ref 1.7–7.7)
Neutrophils Relative %: 73 %
Platelets: 164 10*3/uL (ref 150–400)
RBC: 3.84 MIL/uL — ABNORMAL LOW (ref 4.22–5.81)
RDW: 14.5 % (ref 11.5–15.5)
WBC: 6.9 10*3/uL (ref 4.0–10.5)
nRBC: 0 % (ref 0.0–0.2)

## 2023-09-06 LAB — IRON AND TIBC
Iron: 65 ug/dL (ref 45–182)
Saturation Ratios: 20 % (ref 17.9–39.5)
TIBC: 328 ug/dL (ref 250–450)
UIBC: 263 ug/dL

## 2023-09-06 LAB — FERRITIN: Ferritin: 33 ng/mL (ref 24–336)

## 2023-09-06 MED ORDER — IOHEXOL 9 MG/ML PO SOLN
500.0000 mL | ORAL | Status: AC
  Administered 2023-09-06: 1000 mL via ORAL

## 2023-09-06 MED ORDER — IOHEXOL 300 MG/ML  SOLN
100.0000 mL | Freq: Once | INTRAMUSCULAR | Status: AC | PRN
  Administered 2023-09-06: 100 mL via INTRAVENOUS

## 2023-09-07 LAB — CEA: CEA: 5.6 ng/mL — ABNORMAL HIGH (ref 0.0–4.7)

## 2023-09-12 NOTE — Progress Notes (Signed)
 [facility] [address], [place] [address]    Clinic Day:  09/13/2023  Referring physician: Leesa Pulling, MD  Patient Care Team: [name] Pulling, MD as PCP - General (Unknown Physician Specialty) [name] Knight, MD as PCP - Cardiology (Cardiology) [name] Boros, MD as Medical Oncologist (Medical Oncology)   ASSESSMENT & PLAN:   Assessment: 1. Colon cancer: - Colonoscopy on 10/12/2020 with 7 sessile polyps found in the transverse colon and cecum.  12 mm polyp found in the transverse colon, sessile.  Polyp was removed with piecemeal technique using cold snare.  2 sessile polyps found in the sigmoid colon, removed with cold snare. - Pathology cecal tubular adenoma, sessile serrated polyp of the transverse colon polypectomy, adenocarcinoma arising from a tubular adenoma with high-grade dysplasia of the transverse colon polypectomy.  Sigmoid colon polypectomy showed tubular adenoma, hyperplastic polyp. - Colonoscopy on 02/25/2021 with three 3 to 5 mm polyps in the transverse colon, removed with cold snare.  One 10 mm polyp in the descending colon at 54 cm proximal to the anus. - Pathology on 02/25/2021 shows invasive adenocarcinoma, poorly differentiated, grade 3 of the descending colon polypectomy.  CDX2 positive.  Other findings include transverse colon tubular adenoma and sessile serrated adenoma. - Right hemicolectomy on 05/20/2021. - Pathology shows grade 3 adenocarcinoma, mucinous features, 1.7 cm in the transverse colon, metastatic adenocarcinoma involving 2/29 lymph nodes.  No LVI/perineural invasion.  pT1, PN 1B. - Adjuvant Xeloda  1500 mg 2 weeks on/1 week off started on 07/06/2021.  Cycle 2 on 07/27/2021, 3 tablets twice daily.  Cycle 3 on 08/26/2021, 2 tablets twice daily 2 weeks on/1 week off, dose reduced secondary to HFSR.  Cycle 4 on 09/16/2021.  Cycle 5 Xeloda  1000 mg twice daily on 10/08/2021.  Completed cycle 8 on 01/30/2022.  2. Social/family  history: He lives at home with his wife.  He quit smoking 23 years ago.  He smoked 1 pack/day for 7 years.  He worked in textile's prior to retirement.  He is active and walks 2 miles per day. - Father had prostate cancer.  Brother had lung cancer.  Another brother had colon cancer (? colon), Sister with lung cancer, another sister with cancer, patient unknown.  3.  Prostate cancer: - He was diagnosed with prostate cancer in August 2007, status post seed implants.  His PSA has been undetectable since then.    Plan: 1. Stage III (T1N1B) transverse colon adenocarcinoma: - CTAP on 02/21/2023 showed lymph node in the central pelvis measuring 16 mm, previously 11 mm.  Right lower quadrant prostatic lymph node measures 11 mm, previously 10 mm. - We reviewed CTAP from 09/06/2023: Stable mild mesenteric lymphadenopathy with no new or progressive findings.  Stable IPMN in the pancreas. - Labs from 09/06/2023: CEA stable at 5.6.  LFTs are normal. - Recommend follow-up in 3 months with repeat CEA, CBC and CMP.  After next visit, he may be switched to 6 months with repeat imaging with CTAP with contrast.  If everything is stable at that time, may switch imaging to once a year and follow-ups every 6 months.   2.  Normocytic anemia: - He is taking iron  tablet twice daily for the last 4 to 5 years.  Ferritin is low at 33.  Hemoglobin is 12.  He reports tiredness. - I have recommended to discontinue iron  tablets.  We talked about parenteral iron  therapy with INFeD 1 g IV x 1.  We discussed side  effects including rare chance of anaphylactic reaction.  We will schedule it in the next 1 week.  Will repeat ferritin and iron  panel in 3 months.    Orders Placed This Encounter  Procedures   CBC with Differential    Standing Status:   Future    Expected Date:   12/10/2023    Expiration Date:   03/09/2024   Comprehensive metabolic panel    Standing Status:   Future    Expected Date:   12/10/2023    Expiration Date:    03/09/2024   CEA    Standing Status:   Future    Expected Date:   12/10/2023    Expiration Date:   03/09/2024   Iron  and TIBC ([facility] ONLY)    Standing Status:   Future    Expected Date:   12/10/2023    Expiration Date:   03/09/2024   Ferritin    Standing Status:   Future    Expected Date:   12/10/2023    Expiration Date:   03/09/2024     [name] [name],acting as a scribe for [name] Boros, MD.,have documented all relevant documentation on the behalf of [name] Boros, MD,as directed by  [name] Boros, MD while in the presence of [name] Boros, MD.  I, [name] Boros MD, have reviewed the above documentation for accuracy and completeness, and I agree with the above.    [name] Boros, MD   6/19/20255:12 PM  CHIEF COMPLAINT:   Diagnosis: colon cancer    Cancer Staging  Colon cancer [facility]) Staging form: Colon and Rectum, AJCC 8th Edition - Clinical stage from 03/22/2021: Stage I (cT1, cN0, cM0) - Unsigned - Pathologic stage from 06/23/2021: Stage IIIA (pT1, pN1b, cM0) - Unsigned    Prior Therapy: 1. Right hemicolectomy on 05/20/21 2. Adjuvant Xeloda  07/07/22 - 01/30/22  Current Therapy:  surveillance    HISTORY OF PRESENT ILLNESS:   Oncology History   No history exists.     INTERVAL HISTORY:   [name] [name] is a 83 y.o. male seen for follow-up of colon cancer.  He was last seen by me on 06/06/23.  Since his last visit, he underwent CT AP on 09/06/23 that found: Stable mild mesenteric lymphadenopathy. No new or progressive disease within the abdomen or pelvis. Stable 14 mm unilocular cyst in pancreatic body, likely representing an indolent side-branch IPMN. Tiny nonobstructing left renal calculus. Stable moderate pericardial effusion.  Today, he states that he is doing well overall. His appetite level is at 100%. His energy level is at 50%.   PAST MEDICAL HISTORY:   Past Medical History: Past Medical History:  Diagnosis  Date   Anemia    Arthritis    CAD (coronary artery disease)    DES to circumflex 12/2017   Colon cancer (HCC) 2023   Diabetes mellitus without complication (HCC)    Dysrhythmia    GERD (gastroesophageal reflux disease)    Glaucoma    Gout    Headache    History of kidney stones    Hypertension    NSTEMI (non-ST elevated myocardial infarction) (HCC) 12/31/2017   Paroxysmal atrial fibrillation (HCC)    Pericardial effusion    Idiopathic, recurrent pericardial effusion s/p pericardial window.   Prostate cancer (HCC) 2007   S/P Seed implants    Supraventricular tachycardia (HCC)    Temporal arteritis [facility])     Surgical History: Past Surgical History:  Procedure Laterality Date   BIOPSY  10/12/2020   Procedure: BIOPSY;  Surgeon:  Urban Garden, MD;  [facility];  Service: Gastroenterology;;   BIOPSY  02/25/2021   Procedure: BIOPSY;  Surgeon: Urban Garden, MD;  [facility];  Service: Gastroenterology;;   BIOPSY  04/11/2021   Procedure: BIOPSY;  Surgeon: Normie Becton., MD;  Location: [facility];  Service: Gastroenterology;;   CATARACT EXTRACTION W/PHACO Left 06/07/2015   Procedure: CATARACT EXTRACTION PHACO AND INTRAOCULAR LENS PLACEMENT LEFT EYE CDE=8.00;  Surgeon: Anner Kill, MD;  Location: [facility];  Service: Ophthalmology;  Laterality: Left;   CATARACT EXTRACTION W/PHACO Right 06/17/2015   Procedure: CATARACT EXTRACTION PHACO AND INTRAOCULAR LENS PLACEMENT RIGHT EYE CDE=11.09;  Surgeon: Anner Kill, MD;  Location: [facility];  Service: Ophthalmology;  Laterality: Right;   COLONOSCOPY WITH PROPOFOL  N/A 10/12/2020   Procedure: COLONOSCOPY WITH PROPOFOL ;  Surgeon: Urban Garden, MD;  [facility];  Service: Gastroenterology;  Laterality: N/A;  10:35   COLONOSCOPY WITH PROPOFOL  N/A 02/25/2021   Procedure: COLONOSCOPY WITH PROPOFOL ;  Surgeon: Urban Garden, MD;  [facility];  Service:  Gastroenterology;  Laterality: N/A;  8:10   COLONOSCOPY WITH PROPOFOL  N/A 07/24/2022   Procedure: COLONOSCOPY WITH PROPOFOL ;  Surgeon: Brice Campi Albino Alu., MD;  Location: [facility];  Service: Gastroenterology;  Laterality: N/A;   CORONARY ANGIOPLASTY WITH STENT PLACEMENT  01/01/2018   CORONARY STENT INTERVENTION N/A 01/01/2018   Procedure: CORONARY STENT INTERVENTION;  Surgeon: Lucendia Rusk, MD;  [facility]: [facility];  Service: Cardiovascular;  Laterality: N/A;   ENDOSCOPIC MUCOSAL RESECTION N/A 04/11/2021   Procedure: ENDOSCOPIC MUCOSAL RESECTION;  Surgeon: Brice Campi Albino Alu., MD;  Location: [facility];  Service: Gastroenterology;  Laterality: N/A;   ENTEROSCOPY N/A 10/12/2020   Procedure: PUSH ENTEROSCOPY;  Surgeon: Urban Garden, MD;  [facility];  Service: Gastroenterology;  Laterality: N/A;   ENTEROSCOPY N/A 04/11/2021   Procedure: ENTEROSCOPY;  Surgeon: Brice Campi Albino Alu., MD;  Location: [facility];  Service: Gastroenterology;  Laterality: N/A;   ENTEROSCOPY N/A 07/24/2022   Procedure: ENTEROSCOPY;  Surgeon: Brice Campi Albino Alu., MD;  Location: [facility];  Service: Gastroenterology;  Laterality: N/A;   ESOPHAGOGASTRODUODENOSCOPY (EGD) WITH PROPOFOL  N/A 10/12/2020   Procedure: ESOPHAGOGASTRODUODENOSCOPY (EGD) WITH PROPOFOL ;  Surgeon: Urban Garden, MD;  [facility];  Service: Gastroenterology;  Laterality: N/A;   FRACTURE SURGERY Left 2010   ankle   HEMOSTASIS CLIP PLACEMENT  04/11/2021   Procedure: HEMOSTASIS CLIP PLACEMENT;  Surgeon: Normie Becton., MD;  [facility]: [facility];  Service: Gastroenterology;;   INSERTION PROSTATE RADIATION SEED  2007   KNEE ARTHROSCOPY Left    LAPAROSCOPY N/A 05/22/2021   Procedure: LAPAROSCOPY DIAGNOSTIC WITH WASHOUT OF ABDOMEN;  Surgeon: Joyce Nixon, MD;  Location: [facility];  Service: General;  Laterality: N/A;   LEFT HEART CATH AND CORONARY ANGIOGRAPHY N/A  01/01/2018   Procedure: LEFT HEART CATH AND CORONARY ANGIOGRAPHY;  Surgeon: Lucendia Rusk, MD;  [facility];  Service: Cardiovascular;  Laterality: N/A;   ORIF TIBIA & FIBULA FRACTURES Left 2003   Distal tibial/fibula    PERICARDIAL WINDOW  02/2007   PERICARDIOCENTESIS  2007   hx/notes 10/19/2011   POLYPECTOMY  10/12/2020   Procedure: POLYPECTOMY;  Surgeon: Urban Garden, MD;  [facility];  Service: Gastroenterology;;  small bowel, cecal   POLYPECTOMY  02/25/2021   Procedure: POLYPECTOMY;  Surgeon: Urban Garden, MD;  [facility];  Service: Gastroenterology;;  transverse colon x2  POLYPECTOMY  07/24/2022   Procedure: POLYPECTOMY;  Surgeon: Mansouraty, Albino Alu., MD;  Location: [facility];  Service: Gastroenterology;;   [place] LIFTING INJECTION  04/11/2021   Procedure: SUBMUCOSAL LIFTING INJECTION;  Surgeon: Normie Becton., MD;  Location: [facility];  Service: Gastroenterology;;   SUBMUCOSAL TATTOO INJECTION  04/11/2021   Procedure: SUBMUCOSAL TATTOO INJECTION;  Surgeon: Normie Becton., MD;  Location: [facility];  Service: Gastroenterology;;    Social History: Social History   Socioeconomic History   Marital status: Married    Spouse name: Not on file   Number of children: Not on file   Years of education: Not on file   Highest education level: Not on file  Occupational History   Not on file  Tobacco Use   Smoking status: Former    Current packs/day: 0.00    Average packs/day: 0.3 packs/day for 10.0 years (3.0 ttl pk-yrs)    Types: Cigarettes    Start date: 08/06/1958    Quit date: 1968    Years since quitting: 57.5   Smokeless tobacco: Never  Vaping Use   Vaping status: Never Used  Substance and Sexual Activity   Alcohol  use: Not Currently   Drug use: Never   Sexual activity: Yes  Other Topics Concern   Not on file  Social History Narrative   Not on file   Social Drivers  of Health   Financial Resource Strain: Not on file  Food Insecurity: Not on file  Transportation Needs: Not on file  Physical Activity: Not on file  Stress: Not on file  Social Connections: Not on file  Intimate Partner Violence: Not on file    Family History: Family History  Problem Relation Age of Onset   CAD Mother        MI in 74s   Prostate cancer Father    Prostate cancer Brother    Lung cancer Brother     Current Medications:  Current Outpatient Medications:    Acetaminophen  (TYLENOL  8 HOUR PO), Take 2 tablets by mouth daily as needed., Disp: , Rfl:    allopurinol  (ZYLOPRIM ) 300 MG tablet, Take 450 mg by mouth in the morning., Disp: , Rfl:    aluminum-magnesium  hydroxide 200-200 MG/5ML suspension, Take 5 mLs by mouth every 6 (six) hours as needed for indigestion., Disp: , Rfl:    apixaban  (ELIQUIS ) 5 MG TABS tablet, Take 1 tablet (5 mg total) by mouth 2 (two) times daily for 2 days., Disp: 60 tablet, Rfl: 6   Ascorbic Acid  (VITAMIN C ) 1000 MG tablet, Take 1,000 mg by mouth 2 (two) times daily., Disp: , Rfl:    atorvastatin  (LIPITOR ) 80 MG tablet, Take 1 tablet (80 mg total) by mouth daily., Disp: 90 tablet, Rfl: 3   benazepril  (LOTENSIN ) 40 MG tablet, Take 1 tablet by mouth once daily, Disp: 30 tablet, Rfl: 3   Blood Glucose Monitoring Suppl (ONETOUCH VERIO FLEX SYSTEM) w/Device KIT, USE 1 TO CHECK GLUCOSE ONCE DAILY, Disp: , Rfl:    brimonidine (ALPHAGAN) 0.2 % ophthalmic solution, 1 drop 2 (two) times daily., Disp: , Rfl:    cyanocobalamin  1000 MCG tablet, Take 1 tablet (1,000 mcg total) by mouth daily., Disp: , Rfl:    diltiazem  (CARDIZEM  CD) 240 MG 24 hr capsule, Take 240 mg by mouth daily., Disp: , Rfl:    dorzolamide  (TRUSOPT ) 2 % ophthalmic solution, Place 1 drop into both eyes 2 (two) times daily., Disp: , Rfl:    FERREX 150 150 MG capsule, Take  1 capsule (150 mg total) by mouth 2 (two) times daily., Disp: 60 capsule, Rfl: 5   hydrALAZINE  (APRESOLINE ) 100 MG  tablet, Take 1 tablet (100 mg total) by mouth 3 (three) times daily., Disp: 270 tablet, Rfl: 1   Lancets (ONETOUCH DELICA PLUS LANCET33G) MISC, SMARTSIG:1 Topical Daily, Disp: , Rfl:    loperamide (IMODIUM) 2 MG capsule, Take 2 mg by mouth as needed for diarrhea or loose stools., Disp: , Rfl:    metFORMIN  (GLUCOPHAGE ) 500 MG tablet, Take 1 tablet (500 mg total) by mouth 2 (two) times daily., Disp: 60 tablet, Rfl: 4   metoprolol  succinate (TOPROL -XL) 25 MG 24 hr tablet, Take 1 tablet (25 mg total) by mouth daily., Disp: 30 tablet, Rfl: 3   Multiple Vitamin (MULTIVITAMIN) tablet, Take 1 tablet by mouth in the morning., Disp: , Rfl:    ONETOUCH ULTRA test strip, USE 1 STRIP TO CHECK GLUCOSE ONCE DAILY, Disp: , Rfl:    pantoprazole  (PROTONIX ) 40 MG tablet, Take 1 tablet (40 mg total) by mouth daily., Disp: 90 tablet, Rfl: 1   spironolactone  (ALDACTONE ) 25 MG tablet, Take 0.5 tablets (12.5 mg total) by mouth daily., Disp: 45 tablet, Rfl: 1 No current facility-administered medications for this visit.  Facility-Administered Medications Ordered in Other Visits:    lactated ringers  infusion, , Intravenous, Continuous PRN, Rochell Chroman, CRNA, Stopped at 05/11/22 1355   Allergies: Allergies  Allergen Reactions   Amiodarone  Rash    REVIEW OF SYSTEMS:   Review of Systems  Constitutional:  Negative for chills, fatigue and fever.  HENT:   Negative for lump/mass, mouth sores, nosebleeds, sore throat and trouble swallowing.   Eyes:  Negative for eye problems.  Respiratory:  Negative for cough and shortness of breath.   Cardiovascular:  Negative for chest pain, leg swelling and palpitations.  Gastrointestinal:  Negative for abdominal pain, constipation, diarrhea, nausea and vomiting.  Genitourinary:  Negative for bladder incontinence, difficulty urinating, dysuria, frequency, hematuria and nocturia.   Musculoskeletal:  Negative for arthralgias, back pain, flank pain, myalgias and neck pain.   Skin:  Negative for itching and rash.  Neurological:  Positive for dizziness. Negative for headaches and numbness.  Hematological:  Does not bruise/bleed easily.  Psychiatric/Behavioral:  Negative for depression, sleep disturbance and suicidal ideas. The patient is not nervous/anxious.   All other systems reviewed and are negative.    VITALS:   Blood pressure 129/76, pulse 61, temperature 98.3 F (36.8 C), temperature source Tympanic, resp. rate 18, height 5' 11 (1.803 m), weight 219 lb 9.6 oz (99.6 kg), SpO2 96%.  Wt Readings from Last 3 Encounters:  09/13/23 219 lb 9.6 oz (99.6 kg)  06/07/23 219 lb 9.6 oz (99.6 kg)  06/06/23 216 lb 7.9 oz (98.2 kg)    Body mass index is 30.63 kg/m.  Performance status (ECOG): 1 - Symptomatic but completely ambulatory  PHYSICAL EXAM:   Physical Exam Vitals and nursing note reviewed. Exam conducted with a chaperone present.  Constitutional:      Appearance: Normal appearance.   Cardiovascular:     Rate and Rhythm: Normal rate and regular rhythm.     Pulses: Normal pulses.     Heart sounds: Normal heart sounds.  Pulmonary:     Effort: Pulmonary effort is normal.     Breath sounds: Normal breath sounds.  Abdominal:     Palpations: Abdomen is soft. There is no hepatomegaly, splenomegaly or mass.     Tenderness: There is no abdominal tenderness.  Musculoskeletal:     Right lower leg: No edema.     Left lower leg: No edema.  Lymphadenopathy:     Cervical: No cervical adenopathy.     Right cervical: No superficial, deep or posterior cervical adenopathy.    Left cervical: No superficial, deep or posterior cervical adenopathy.     Upper Body:     Right upper body: No supraclavicular or axillary adenopathy.     Left upper body: No supraclavicular or axillary adenopathy.   Neurological:     General: No focal deficit present.     Mental Status: He is alert and oriented to person, place, and time.   Psychiatric:        Mood and Affect:  Mood normal.        Behavior: Behavior normal.     LABS:      Latest Ref Rng & Units 09/06/2023   11:49 AM 05/30/2023   11:02 AM 02/21/2023    9:52 AM  CBC  WBC 4.0 - 10.5 K/uL 6.9  7.3  7.2   Hemoglobin 13.0 - 17.0 g/dL 65.7  84.6  96.2   Hematocrit 39.0 - 52.0 % 36.9  38.1  37.8   Platelets 150 - 400 K/uL 164  175  172       Latest Ref Rng & Units 09/06/2023   11:49 AM 05/30/2023   11:02 AM 02/21/2023    9:52 AM  CMP  Glucose 70 - 99 mg/dL 952  841  324   BUN 8 - 23 mg/dL 20  16  21    Creatinine 0.61 - 1.24 mg/dL 4.01  0.27  2.53   Sodium 135 - 145 mmol/L 135  134  134   Potassium 3.5 - 5.1 mmol/L 4.6  4.3  4.0   Chloride 98 - 111 mmol/L 105  105  103   CO2 22 - 32 mmol/L 24  22  23    Calcium  8.9 - 10.3 mg/dL 9.6  9.6  9.3   Total Protein 6.5 - 8.1 g/dL 7.2  7.0  6.6   Total Bilirubin 0.0 - 1.2 mg/dL 0.5  0.6  0.8   Alkaline Phos 38 - 126 U/L 74  74  68   AST 15 - 41 U/L 27  27  25    ALT 0 - 44 U/L 20  23  23       Lab Results  Component Value Date   CEA1 5.6 (H) 09/06/2023   CEA 1.8 03/07/2007   /  CEA  Date Value Ref Range Status  09/06/2023 5.6 (H) 0.0 - 4.7 ng/mL Final    Comment:    (NOTE)                             Nonsmokers          <3.9                             Smokers             <5.6 [employer] Electrochemiluminescence Immunoassay (ECLIA) Values obtained with different assay methods or kits cannot be used interchangeably.  Results cannot be interpreted as absolute evidence of the presence or absence of malignant disease. Performed At: [facility] [address] [place], [place] [id number] Pearlean Botts MD QV:[id number]   03/07/2007 1.8  Final   No results found for: PSA1 No results found  for: [id number] No results found for: [id number]  Lab Results  Component Value Date   TOTALPROTELP 6.3 03/08/2021   ALBUMINELP 3.8 03/08/2021   A1GS 0.2 03/08/2021   A2GS 0.7 03/08/2021   BETS 0.8 03/08/2021   GAMS 0.8 03/08/2021    MSPIKE Not Observed 03/08/2021   SPEI Comment 03/08/2021   Lab Results  Component Value Date   TIBC 328 09/06/2023   TIBC 324 05/30/2023   TIBC 316 02/21/2023   FERRITIN 33 09/06/2023   FERRITIN 34 05/30/2023   FERRITIN 39 02/21/2023   IRONPCTSAT 20 09/06/2023   IRONPCTSAT 25 05/30/2023   IRONPCTSAT 20 02/21/2023   Lab Results  Component Value Date   LDH 169 06/19/2007     STUDIES:   CT ABDOMEN PELVIS W CONTRAST Result Date: 09/06/2023 CLINICAL DATA:  Follow-up colon carcinoma. Surveillance. * Tracking Code: BO * EXAM: CT ABDOMEN AND PELVIS WITH CONTRAST TECHNIQUE: Multidetector CT imaging of the abdomen and pelvis was performed using the standard protocol following bolus administration of intravenous contrast. RADIATION DOSE REDUCTION: This exam was performed according to the departmental dose-optimization program which includes automated exposure control, adjustment of the mA and/or kV according to patient size and/or use of iterative reconstruction technique. CONTRAST:  OMNIPAQUE  IOHEXOL  300 MG/ML  SOLN COMPARISON:  02/21/2023 FINDINGS: Lower Chest: Moderate pericardial effusion, without significant change. Hepatobiliary: Multiple tiny hepatic cysts remain stable. No suspicious hepatic masses identified. Prior cholecystectomy. No evidence of biliary obstruction. Pancreas: Stable 14 mm unilocular cyst in the pancreatic body. No other pancreatic lesions identified. No evidence of solid pancreatic mass or pancreatic ductal dilatation. Spleen: Within normal limits in size and appearance. Adrenals/Urinary Tract: No suspicious masses identified. 2 mm calculus again seen in upper pole of the left kidney. No evidence of ureteral calculi or hydronephrosis. Stomach/Bowel: Postop changes from right hemicolectomy again seen. No soft tissue mass identified. No evidence of obstruction, inflammatory process or abnormal fluid collections. Vascular/Lymphatic: Mild mesenteric lymphadenopathy in the  right lower quadrant and central upper pelvis shows no significant change, with largest index lymph node measuring 16 mm on image 61/2. No new or increased sites of lymphadenopathy identified. No acute vascular findings. Reproductive: Brachytherapy seeds again seen throughout the prostate. Other:  None. Musculoskeletal:  No suspicious bone lesions identified. IMPRESSION: Stable mild mesenteric lymphadenopathy. No new or progressive disease within the abdomen or pelvis. Stable 14 mm unilocular cyst in pancreatic body, likely representing an indolent side-branch IPMN. Recommend continued attention on follow-up imaging. Tiny nonobstructing left renal calculus. Stable moderate pericardial effusion. Electronically Signed   By: Marlyce Sine M.D.   On: 09/06/2023 16:16

## 2023-09-13 ENCOUNTER — Inpatient Hospital Stay: Admitting: Hematology

## 2023-09-13 VITALS — BP 129/76 | HR 61 | Temp 98.3°F | Resp 18 | Ht 71.0 in | Wt 219.6 lb

## 2023-09-13 DIAGNOSIS — C184 Malignant neoplasm of transverse colon: Secondary | ICD-10-CM

## 2023-09-13 DIAGNOSIS — D509 Iron deficiency anemia, unspecified: Secondary | ICD-10-CM | POA: Diagnosis not present

## 2023-09-13 DIAGNOSIS — Z85038 Personal history of other malignant neoplasm of large intestine: Secondary | ICD-10-CM | POA: Diagnosis not present

## 2023-09-13 DIAGNOSIS — D649 Anemia, unspecified: Secondary | ICD-10-CM | POA: Diagnosis not present

## 2023-09-13 DIAGNOSIS — Z87891 Personal history of nicotine dependence: Secondary | ICD-10-CM | POA: Diagnosis not present

## 2023-09-13 NOTE — Patient Instructions (Addendum)
 [facility] at [facility] Discharge Instructions   You were seen and examined today by Dr. Cheree Cords.  He reviewed the results of your lab work which are mostly normal/stable. Your iron  is low. We will arrange for you to have an iron  infusion here in the clinic.   He reviewed the results of your CT scan which did not show any evidence of cancer.   We will see you back in 3 months. We will repeat lab work prior to this visit.    Return as scheduled.    Thank you for choosing [facility] at [facility] to provide your oncology and hematology care.  To afford each patient quality time with our provider, please arrive at least 15 minutes before your scheduled appointment time.   If you have a lab appointment with the Cancer Center please come in thru the Main Entrance and check in at the main information desk.  You need to re-schedule your appointment should you arrive 10 or more minutes late.  We strive to give you quality time with our providers, and arriving late affects you and other patients whose appointments are after yours.  Also, if you no show three or more times for appointments you may be dismissed from the clinic at the providers discretion.     Again, thank you for choosing [facility].  Our hope is that these requests will decrease the amount of time that you wait before being seen by our physicians.       _____________________________________________________________  Should you have questions after your visit to [facility], please contact our office at [phone] and follow the prompts.  Our office hours are 8:00 a.m. and 4:30 p.m. Monday - Friday.  Please note that voicemails left after 4:00 p.m. may not be returned until the following business day.  We are closed weekends and major holidays.  You do have access to a nurse 24-7, just call the main number to the clinic [phone] and do not press any  options, hold on the line and a nurse will answer the phone.    For prescription refill requests, have your pharmacy contact our office and allow 72 hours.    Due to Covid, you will need to wear a mask upon entering the hospital. If you do not have a mask, a mask will be given to you at the Main Entrance upon arrival. For doctor visits, patients may have 1 support person age 67 or older with them. For treatment visits, patients can not have anyone with them due to social distancing guidelines and our immunocompromised population.

## 2023-09-14 DIAGNOSIS — E113213 Type 2 diabetes mellitus with mild nonproliferative diabetic retinopathy with macular edema, bilateral: Secondary | ICD-10-CM | POA: Diagnosis not present

## 2023-09-24 ENCOUNTER — Inpatient Hospital Stay

## 2023-09-24 VITALS — BP 150/61 | HR 64 | Temp 97.7°F | Resp 18

## 2023-09-24 DIAGNOSIS — Z87891 Personal history of nicotine dependence: Secondary | ICD-10-CM | POA: Diagnosis not present

## 2023-09-24 DIAGNOSIS — D649 Anemia, unspecified: Secondary | ICD-10-CM | POA: Diagnosis not present

## 2023-09-24 DIAGNOSIS — D509 Iron deficiency anemia, unspecified: Secondary | ICD-10-CM

## 2023-09-24 DIAGNOSIS — Z85038 Personal history of other malignant neoplasm of large intestine: Secondary | ICD-10-CM | POA: Diagnosis not present

## 2023-09-24 MED ORDER — METHYLPREDNISOLONE SODIUM SUCC 125 MG IJ SOLR
125.0000 mg | Freq: Once | INTRAMUSCULAR | Status: AC
  Administered 2023-09-24: 125 mg via INTRAVENOUS
  Filled 2023-09-24: qty 2

## 2023-09-24 MED ORDER — SODIUM CHLORIDE 0.9 % IV SOLN: Freq: Once | INTRAVENOUS | Status: AC

## 2023-09-24 MED ORDER — ACETAMINOPHEN 325 MG PO TABS
650.0000 mg | ORAL_TABLET | Freq: Once | ORAL | Status: AC
  Administered 2023-09-24: 650 mg via ORAL
  Filled 2023-09-24: qty 2

## 2023-09-24 MED ORDER — CETIRIZINE HCL 10 MG/ML IV SOLN
10.0000 mg | Freq: Once | INTRAVENOUS | Status: AC
  Administered 2023-09-24: 10 mg via INTRAVENOUS
  Filled 2023-09-24: qty 1

## 2023-09-24 MED ORDER — FAMOTIDINE IN NACL 20-0.9 MG/50ML-% IV SOLN
20.0000 mg | Freq: Once | INTRAVENOUS | Status: AC
  Administered 2023-09-24: 20 mg via INTRAVENOUS
  Filled 2023-09-24: qty 50

## 2023-09-24 MED ORDER — SODIUM CHLORIDE 0.9 % IV SOLN
50.0000 mg | Freq: Once | INTRAVENOUS | Status: AC
  Administered 2023-09-24: 50 mg via INTRAVENOUS
  Filled 2023-09-24: qty 1

## 2023-09-24 MED ORDER — SODIUM CHLORIDE 0.9 % IV SOLN
950.0000 mg | Freq: Once | INTRAVENOUS | Status: AC
  Administered 2023-09-24: 950 mg via INTRAVENOUS
  Filled 2023-09-24: qty 19

## 2023-09-24 NOTE — Progress Notes (Signed)
   09/24/23 1000  Spiritual Encounters  Type of Visit Initial  Care provided to: Pt and family (Wife- Art gallery manager)  Referral source Chaplain assessment  Reason for visit Routine spiritual support  Spiritual Framework  Presenting Themes Meaning/purpose/sources of inspiration;Values and beliefs;Coping tools;Courage hope and growth;Rituals and practive  Values/beliefs Strong Christistian faith  Community/Connection Family;Faith community;Spiritual leader  Patient Stress Factors None identified  Family Stress Factors None identified  Interventions  Spiritual Care Interventions Made Established relationship of care and support;Reflective listening;Narrative/life review;Explored values/beliefs/practices/strengths;Prayer  Intervention Outcomes  Outcomes Connection to spiritual care;Awareness around self/spiritual resourses  Spiritual Care Plan  Spiritual Care Issues Still Outstanding No further spiritual care needs at this time (see row info)   Reason for Visit: Chaplain making rounds on the floor visiting infusion Pts   Description of Visit: Arriving in the room I found [name] [name] in a recliner chair receiving treatment and his wife [name] [name] seated next to her.  I introduced myself to them as the new chaplain for the cancer center and provided education to explain that the role of a chaplain is more than just a religious presence.  They were receptive to a conversation with me    I facilitated life review and storytelling with [name] [name], and he easily shared with me about his 59-year marriage.  They both also shared their ages when they were "saved". Faith is an integral part of their life experience, and it was interwoven into their storytelling.   [name] [name] shared with me his cancer story from 2023, and [name] [name] shared as well, talking about and processing his surgeries from her perspective.  I practiced curiosity and explored their emotions as well as assessed their resources and strengths.   Prayer provided  upon Pt request   Plan of Care: Spending about 45 minutes with [name] [name] and his wife I can see that they are well supported and possess all the necessary skills for coping and thriving.  Therefore, no spiritual care interventions are planned at this time beyond checking in with them in a few months.     Maude Roll, MDiv   Chaplain, [facility]  [email or web address] [phone]

## 2023-09-24 NOTE — Patient Instructions (Signed)
 [facility] [place] - A DEPT OF [facility]  Discharge Instructions: Thank you for choosing [facility] to provide your oncology and hematology care.  If you have a lab appointment with the Cancer Center - please note that after April 8th, 2024, all labs will be drawn in the cancer center.  You do not have to check in or register with the main entrance as you have in the past but will complete your check-in in the cancer center.  Wear comfortable clothing and clothing appropriate for easy access to any Portacath or PICC line.   We strive to give you quality time with your provider. You may need to reschedule your appointment if you arrive late (15 or more minutes).  Arriving late affects you and other patients whose appointments are after yours.  Also, if you miss three or more appointments without notifying the office, you may be dismissed from the clinic at the provider's discretion.      For prescription refill requests, have your pharmacy contact our office and allow 72 hours for refills to be completed.    Today you received Infed IV iron infusion.    BELOW ARE SYMPTOMS THAT SHOULD BE REPORTED IMMEDIATELY: *FEVER GREATER THAN 100.4 F (38 C) OR HIGHER *CHILLS OR SWEATING *NAUSEA AND VOMITING THAT IS NOT CONTROLLED WITH YOUR NAUSEA MEDICATION *UNUSUAL SHORTNESS OF BREATH *UNUSUAL BRUISING OR BLEEDING *URINARY PROBLEMS (pain or burning when urinating, or frequent urination) *BOWEL PROBLEMS (unusual diarrhea, constipation, pain near the anus) TENDERNESS IN MOUTH AND THROAT WITH OR WITHOUT PRESENCE OF ULCERS (sore throat, sores in mouth, or a toothache) UNUSUAL RASH, SWELLING OR PAIN  UNUSUAL VAGINAL DISCHARGE OR ITCHING   Items with * indicate a potential emergency and should be followed up as soon as possible or go to the Emergency Department if any problems should occur.  Please show the CHEMOTHERAPY ALERT CARD or IMMUNOTHERAPY ALERT CARD at  check-in to the Emergency Department and triage nurse.  Should you have questions after your visit or need to cancel or reschedule your appointment, please contact [facility] [place] - A DEPT OF [facility] [phone]  and follow the prompts.  Office hours are 8:00 a.m. to 4:30 p.m. Monday - Friday. Please note that voicemails left after 4:00 p.m. may not be returned until the following business day.  We are closed weekends and major holidays. You have access to a nurse at all times for urgent questions. Please call the main number to the clinic [phone] and follow the prompts.  For any non-urgent questions, you may also contact your provider using MyChart. We now offer e-Visits for anyone 42 and older to request care online for non-urgent symptoms. For details visit [email or web address].   Also download the MyChart app! Go to the app store, search "MyChart", open the app, select [place], and log in with your MyChart username and password.

## 2023-09-24 NOTE — Progress Notes (Signed)
Patient presents today for iron infusion. Patient is in satisfactory condition with no new complaints voiced.  Vital signs are stable.  We will proceed with infusion per provider orders.    Peripheral IV started with good blood return pre and post infusion.  Infed 1,000 mg given today per MD orders. Tolerated infusion without adverse affects. Vital signs stable. No complaints at this time. Discharged from clinic ambulatory in stable condition. Alert and oriented x 3. F/U with [facility] as scheduled.

## 2023-09-29 DIAGNOSIS — R69 Illness, unspecified: Secondary | ICD-10-CM | POA: Diagnosis not present

## 2023-09-29 DIAGNOSIS — R509 Fever, unspecified: Secondary | ICD-10-CM | POA: Diagnosis not present

## 2023-10-01 ENCOUNTER — Other Ambulatory Visit: Payer: Self-pay | Admitting: Cardiology

## 2023-10-03 ENCOUNTER — Other Ambulatory Visit: Payer: Self-pay

## 2023-10-03 MED ORDER — ATORVASTATIN CALCIUM 80 MG PO TABS: 80.0000 mg | ORAL_TABLET | Freq: Every day | ORAL | 2 refills | Status: AC

## 2023-10-12 DIAGNOSIS — E113213 Type 2 diabetes mellitus with mild nonproliferative diabetic retinopathy with macular edema, bilateral: Secondary | ICD-10-CM | POA: Diagnosis not present

## 2023-10-15 IMAGING — CT CT ABD-PELV W/O CM
2 of 4 series · 16 of 46 positions shown, 18 images · non-contrast
Comparison: 03/18/2021

CLINICAL DATA: Postop abdominal pain with hypotension.



[Series 2: axial st · axial · 0.94mm/px · z∈[-464,-9]mm · 13 of 107 slices shown, 15 images]
[im 8/107  soft-tissue]
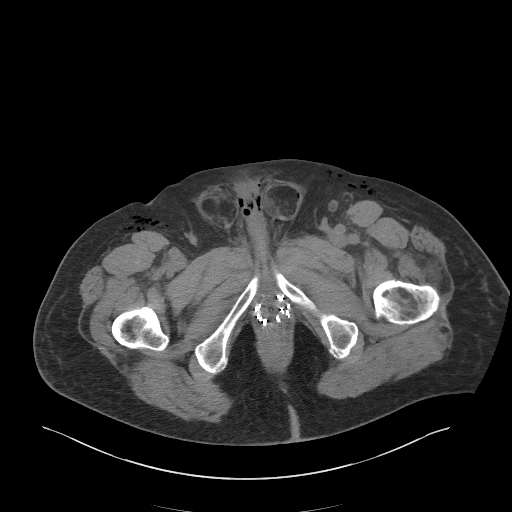
[im 8/107  bone]
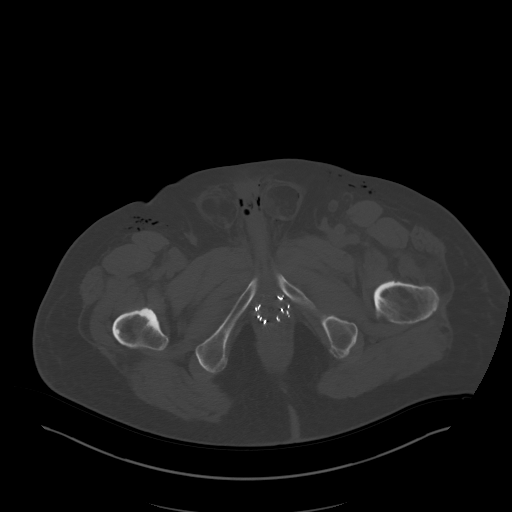
[im 15/107  soft-tissue]
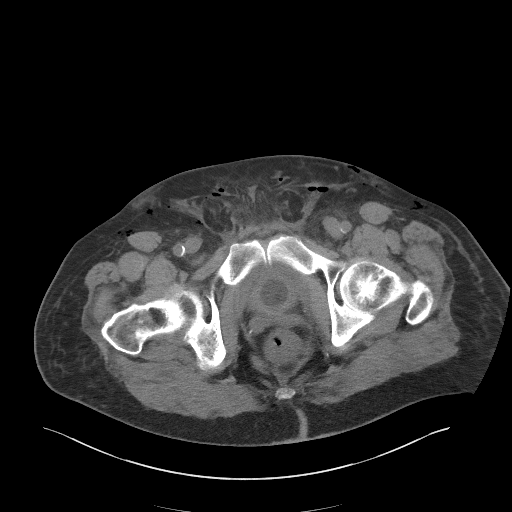
[im 22/107  soft-tissue]
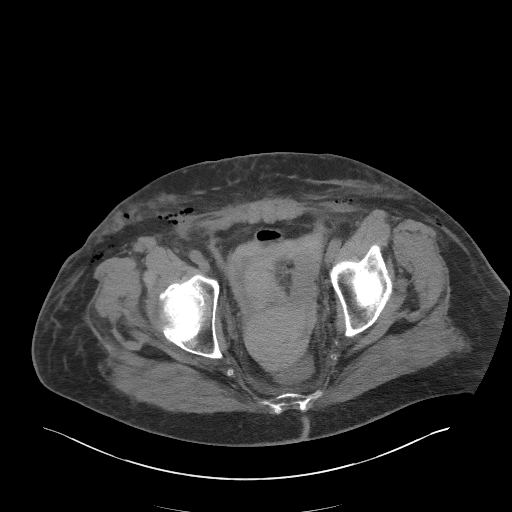
[im 29/107  soft-tissue]
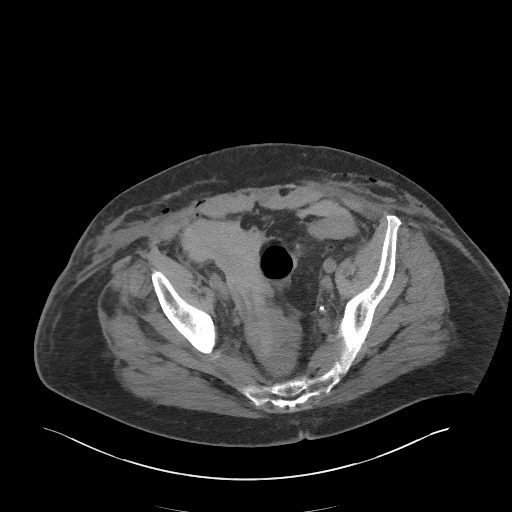
[im 36/107  soft-tissue]
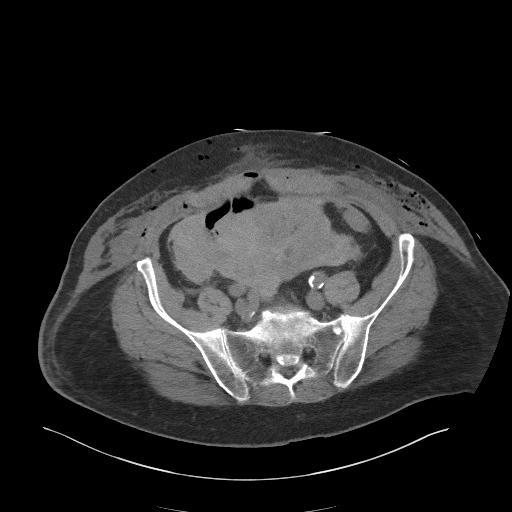
[im 43/107  soft-tissue]
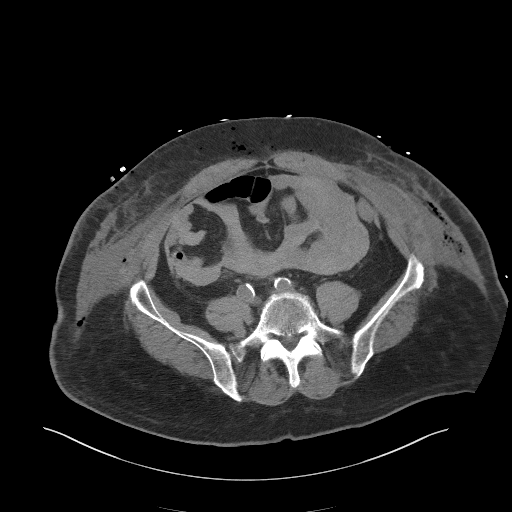
[im 57/107  soft-tissue]
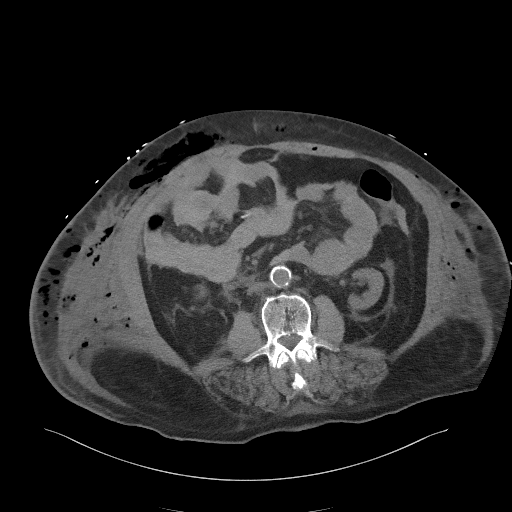
[im 64/107  soft-tissue]
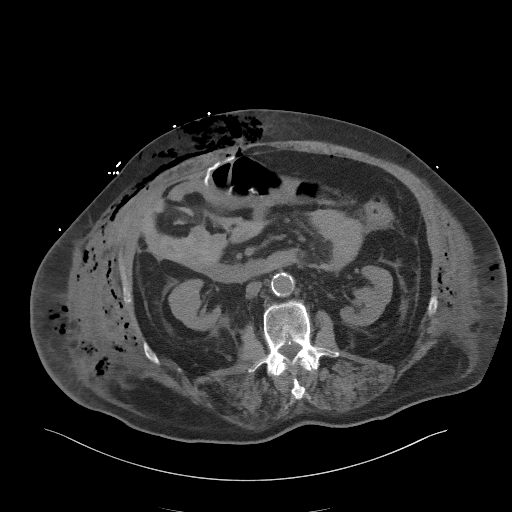
[im 71/107  soft-tissue]
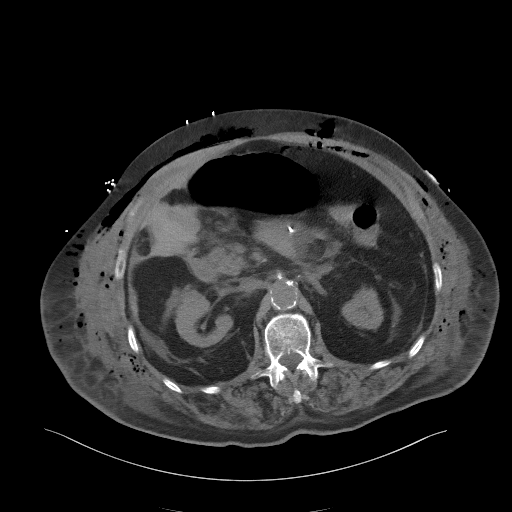
[im 71/107  bone]
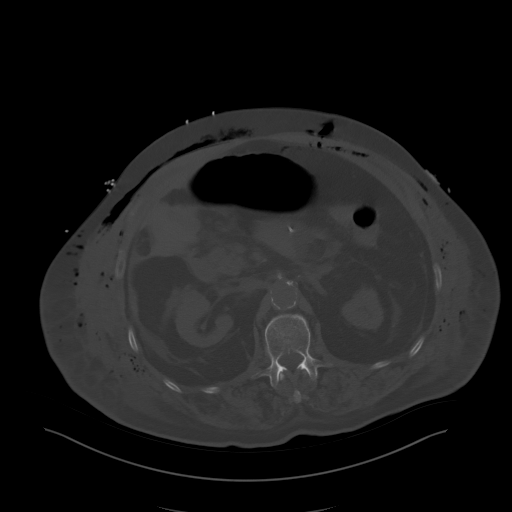
[im 78/107  soft-tissue]
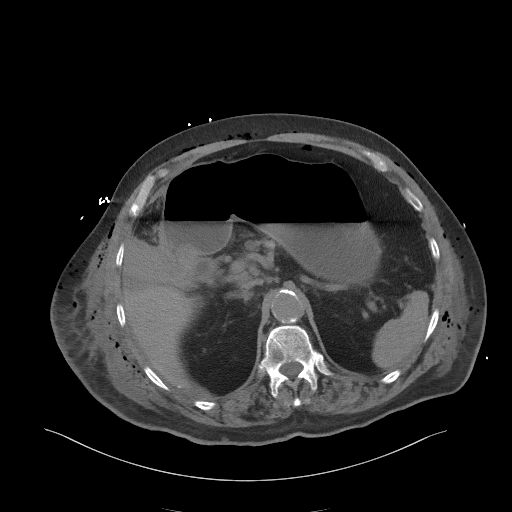
[im 85/107  soft-tissue]
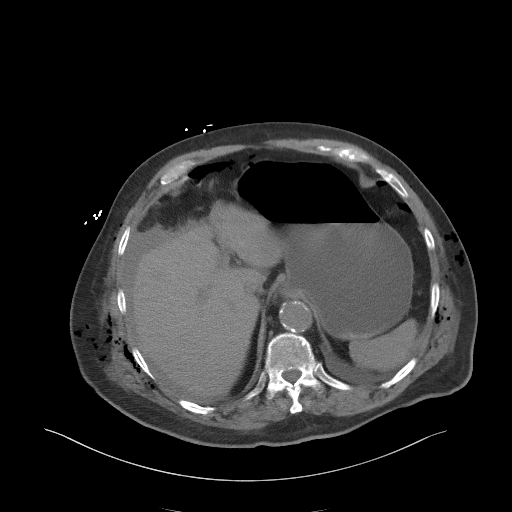
[im 92/107  soft-tissue]
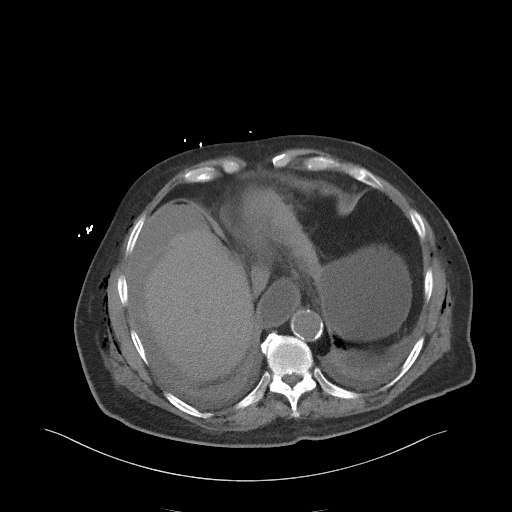
[im 99/107  soft-tissue]
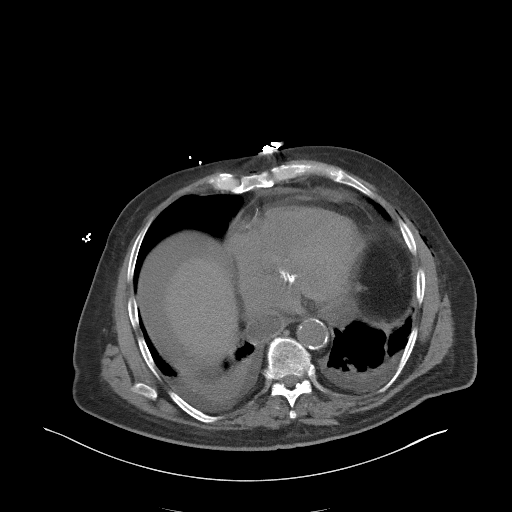

[Series 4: coronal st · coronal · 1.01mm/px · 3 of 113 slices shown]
[im 38/113  soft-tissue]
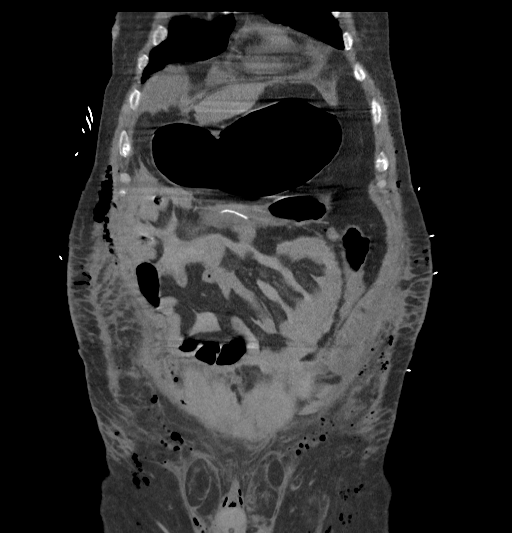
[im 50/113  soft-tissue]
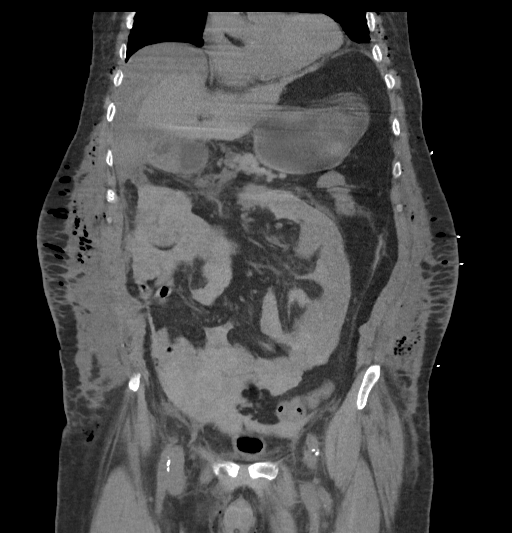
[im 63/113  soft-tissue]
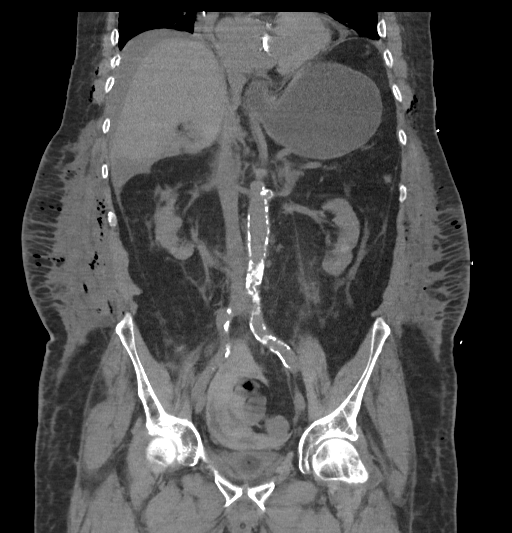

[16 of 46 positions shown; findings below may reference images not displayed]

FINDINGS: Lower chest: Small pericardial effusion and pleural effusions with
dependent atelectasis. Distended lower esophagus and conjunction
with gas and fluid distended stomach.

Hepatobiliary: No focal liver abnormality.No evidence of biliary
obstruction or stone.

Pancreas: Generalized atrophy

Spleen: Unremarkable.

Adrenals/Urinary Tract: Negative adrenals. No hydronephrosis or
stone. Symmetric renal atrophy. High-density 8 mm nodule at the left
lower pole, likely hemorrhagic cyst. Punctate left renal calculus.
Foley catheter with collapsed bladder.

Stomach/Bowel: Right hemicolectomy. Diffuse hemoperitoneum present
throughout the interloop spaces and greatest at the pelvis, which
limits localization of the source. Gas and fluid distended stomach
extending up the esophagus. No pneumatosis or portal venous gas.

Vascular/Lymphatic: Confluent atheromatous calcification of the
aorta. No mass or adenopathy.

Reproductive:Prostate brachytherapy seeds.

Other: In addition to the hemoperitoneum there is more fluid density
around the liver which is small volume. Expected postoperative
pneumoperitoneum. Extensive the abdominal wall gas and swelling, no
focal hematoma. Fatty enlargement of the bilateral inguinal canal.

Musculoskeletal: No acute abnormalities.
IMPRESSION: 1. Diffuse interloop and pelvic hemoperitoneum.
2. Extensive abdominal wall swelling.
3. Gas and fluid distended stomach with fluid refluxing up the
descending lower esophagus.
4. Atelectasis and small pleural effusions.

## 2023-10-23 ENCOUNTER — Other Ambulatory Visit: Payer: Self-pay

## 2023-10-23 ENCOUNTER — Encounter (HOSPITAL_COMMUNITY): Payer: Self-pay

## 2023-10-23 ENCOUNTER — Emergency Department (HOSPITAL_COMMUNITY)

## 2023-10-23 ENCOUNTER — Inpatient Hospital Stay (HOSPITAL_COMMUNITY)
Admission: EM | Admit: 2023-10-23 | Discharge: 2023-10-25 | DRG: 683 | Disposition: A | Discharge status: Home / Self Care | Attending: Internal Medicine | Admitting: Internal Medicine

## 2023-10-23 ENCOUNTER — Telehealth: Payer: Self-pay | Admitting: *Deleted

## 2023-10-23 DIAGNOSIS — N281 Cyst of kidney, acquired: Secondary | ICD-10-CM | POA: Diagnosis not present

## 2023-10-23 DIAGNOSIS — R109 Unspecified abdominal pain: Principal | ICD-10-CM

## 2023-10-23 DIAGNOSIS — M109 Gout, unspecified: Secondary | ICD-10-CM | POA: Diagnosis present

## 2023-10-23 DIAGNOSIS — E1165 Type 2 diabetes mellitus with hyperglycemia: Secondary | ICD-10-CM | POA: Diagnosis present

## 2023-10-23 DIAGNOSIS — Z9049 Acquired absence of other specified parts of digestive tract: Secondary | ICD-10-CM | POA: Diagnosis not present

## 2023-10-23 DIAGNOSIS — Z7901 Long term (current) use of anticoagulants: Secondary | ICD-10-CM | POA: Diagnosis not present

## 2023-10-23 DIAGNOSIS — K529 Noninfective gastroenteritis and colitis, unspecified: Secondary | ICD-10-CM | POA: Diagnosis not present

## 2023-10-23 DIAGNOSIS — N200 Calculus of kidney: Secondary | ICD-10-CM | POA: Diagnosis present

## 2023-10-23 DIAGNOSIS — C189 Malignant neoplasm of colon, unspecified: Secondary | ICD-10-CM | POA: Diagnosis present

## 2023-10-23 DIAGNOSIS — K219 Gastro-esophageal reflux disease without esophagitis: Secondary | ICD-10-CM | POA: Diagnosis not present

## 2023-10-23 DIAGNOSIS — I2510 Atherosclerotic heart disease of native coronary artery without angina pectoris: Secondary | ICD-10-CM | POA: Diagnosis present

## 2023-10-23 DIAGNOSIS — K8689 Other specified diseases of pancreas: Secondary | ICD-10-CM | POA: Diagnosis not present

## 2023-10-23 DIAGNOSIS — Z8249 Family history of ischemic heart disease and other diseases of the circulatory system: Secondary | ICD-10-CM

## 2023-10-23 DIAGNOSIS — Z87891 Personal history of nicotine dependence: Secondary | ICD-10-CM

## 2023-10-23 DIAGNOSIS — E860 Dehydration: Secondary | ICD-10-CM | POA: Diagnosis present

## 2023-10-23 DIAGNOSIS — Z801 Family history of malignant neoplasm of trachea, bronchus and lung: Secondary | ICD-10-CM | POA: Diagnosis not present

## 2023-10-23 DIAGNOSIS — Z7984 Long term (current) use of oral hypoglycemic drugs: Secondary | ICD-10-CM | POA: Diagnosis not present

## 2023-10-23 DIAGNOSIS — Z9221 Personal history of antineoplastic chemotherapy: Secondary | ICD-10-CM | POA: Diagnosis not present

## 2023-10-23 DIAGNOSIS — Z8042 Family history of malignant neoplasm of prostate: Secondary | ICD-10-CM

## 2023-10-23 DIAGNOSIS — E871 Hypo-osmolality and hyponatremia: Secondary | ICD-10-CM | POA: Diagnosis present

## 2023-10-23 DIAGNOSIS — I252 Old myocardial infarction: Secondary | ICD-10-CM | POA: Diagnosis not present

## 2023-10-23 DIAGNOSIS — Z955 Presence of coronary angioplasty implant and graft: Secondary | ICD-10-CM | POA: Diagnosis not present

## 2023-10-23 DIAGNOSIS — R1030 Lower abdominal pain, unspecified: Secondary | ICD-10-CM | POA: Diagnosis not present

## 2023-10-23 DIAGNOSIS — I10 Essential (primary) hypertension: Secondary | ICD-10-CM | POA: Diagnosis present

## 2023-10-23 DIAGNOSIS — R197 Diarrhea, unspecified: Secondary | ICD-10-CM | POA: Diagnosis not present

## 2023-10-23 DIAGNOSIS — N179 Acute kidney failure, unspecified: Secondary | ICD-10-CM | POA: Diagnosis not present

## 2023-10-23 DIAGNOSIS — I480 Paroxysmal atrial fibrillation: Secondary | ICD-10-CM | POA: Diagnosis present

## 2023-10-23 DIAGNOSIS — Z79899 Other long term (current) drug therapy: Secondary | ICD-10-CM | POA: Diagnosis not present

## 2023-10-23 DIAGNOSIS — Z8546 Personal history of malignant neoplasm of prostate: Secondary | ICD-10-CM

## 2023-10-23 DIAGNOSIS — K7689 Other specified diseases of liver: Secondary | ICD-10-CM | POA: Diagnosis not present

## 2023-10-23 DIAGNOSIS — Z85038 Personal history of other malignant neoplasm of large intestine: Secondary | ICD-10-CM | POA: Diagnosis not present

## 2023-10-23 LAB — COMPREHENSIVE METABOLIC PANEL WITH GFR
ALT: 21 U/L (ref 0–44)
AST: 28 U/L (ref 15–41)
Albumin: 4.1 g/dL (ref 3.5–5.0)
Alkaline Phosphatase: 84 U/L (ref 38–126)
Anion gap: 13 (ref 5–15)
BUN: 27 mg/dL — ABNORMAL HIGH (ref 8–23)
CO2: 18 mmol/L — ABNORMAL LOW (ref 22–32)
Calcium: 9.6 mg/dL (ref 8.9–10.3)
Chloride: 103 mmol/L (ref 98–111)
Creatinine, Ser: 2.19 mg/dL — ABNORMAL HIGH (ref 0.61–1.24)
GFR, Estimated: 29 mL/min — ABNORMAL LOW (ref >60)
Glucose, Bld: 198 mg/dL — ABNORMAL HIGH (ref 70–99)
Potassium: 4.3 mmol/L (ref 3.5–5.1)
Sodium: 134 mmol/L — ABNORMAL LOW (ref 135–145)
Total Bilirubin: 0.9 mg/dL (ref 0.0–1.2)
Total Protein: 7.4 g/dL (ref 6.5–8.1)

## 2023-10-23 LAB — CBC WITH DIFFERENTIAL/PLATELET
Abs Granulocyte: 7.8 K/uL — ABNORMAL HIGH (ref 1.5–6.5)
Abs Immature Granulocytes: 0.07 K/uL (ref 0.00–0.07)
Basophils Absolute: 0.1 K/uL (ref 0.0–0.1)
Basophils Relative: 1 %
Eosinophils Absolute: 0 K/uL (ref 0.0–0.5)
Eosinophils Relative: 0 %
HCT: 40.9 % (ref 39.0–52.0)
Hemoglobin: 13.6 g/dL (ref 13.0–17.0)
Immature Granulocytes: 1 %
Lymphocytes Relative: 11 %
Lymphs Abs: 1.1 K/uL (ref 0.7–4.0)
MCH: 32.1 pg (ref 26.0–34.0)
MCHC: 33.3 g/dL (ref 30.0–36.0)
MCV: 96.5 fL (ref 80.0–100.0)
Monocytes Absolute: 0.9 K/uL (ref 0.1–1.0)
Monocytes Relative: 9 %
Neutro Abs: 7.8 K/uL — ABNORMAL HIGH (ref 1.7–7.7)
Neutrophils Relative %: 78 %
Platelets: 138 K/uL — ABNORMAL LOW (ref 150–400)
RBC: 4.24 MIL/uL (ref 4.22–5.81)
RDW: 14.4 % (ref 11.5–15.5)
WBC: 10 K/uL (ref 4.0–10.5)
nRBC: 0 % (ref 0.0–0.2)

## 2023-10-23 LAB — URINALYSIS, ROUTINE W REFLEX MICROSCOPIC
Bilirubin Urine: NEGATIVE
Glucose, UA: 50 mg/dL — AB
Ketones, ur: NEGATIVE mg/dL
Leukocytes,Ua: NEGATIVE
Nitrite: NEGATIVE
Protein, ur: 30 mg/dL — AB
RBC / HPF: >50 RBC/hpf (ref 0–5)
Specific Gravity, Urine: 1.006 (ref 1.005–1.030)
pH: 5 (ref 5.0–8.0)

## 2023-10-23 LAB — BRAIN NATRIURETIC PEPTIDE: B Natriuretic Peptide: 215 pg/mL — ABNORMAL HIGH (ref 0.0–100.0)

## 2023-10-23 LAB — GLUCOSE, CAPILLARY: Glucose-Capillary: 128 mg/dL — ABNORMAL HIGH (ref 70–99)

## 2023-10-23 LAB — LIPASE, BLOOD: Lipase: 27 U/L (ref 11–51)

## 2023-10-23 MED ORDER — MORPHINE SULFATE (PF) 2 MG/ML IV SOLN
2.0000 mg | INTRAVENOUS | Status: DC | PRN
  Administered 2023-10-23: 2 mg via INTRAVENOUS
  Filled 2023-10-23: qty 1

## 2023-10-23 MED ORDER — ONDANSETRON HCL 4 MG/2ML IJ SOLN
4.0000 mg | Freq: Once | INTRAMUSCULAR | Status: AC
  Administered 2023-10-23: 4 mg via INTRAVENOUS
  Filled 2023-10-23: qty 2

## 2023-10-23 MED ORDER — METOPROLOL SUCCINATE ER 25 MG PO TB24
25.0000 mg | ORAL_TABLET | Freq: Every day | ORAL | Status: DC
  Administered 2023-10-24 – 2023-10-25 (×2): 25 mg via ORAL
  Filled 2023-10-23 (×2): qty 1

## 2023-10-23 MED ORDER — ACETAMINOPHEN 650 MG RE SUPP: 650.0000 mg | Freq: Four times a day (QID) | RECTAL | Status: DC | PRN

## 2023-10-23 MED ORDER — SODIUM CHLORIDE 0.9 % IV SOLN
1.0000 g | Freq: Once | INTRAVENOUS | Status: AC
  Administered 2023-10-23: 1 g via INTRAVENOUS
  Filled 2023-10-23: qty 10

## 2023-10-23 MED ORDER — ALLOPURINOL 300 MG PO TABS
450.0000 mg | ORAL_TABLET | Freq: Every morning | ORAL | Status: DC
  Administered 2023-10-24 – 2023-10-25 (×2): 450 mg via ORAL
  Filled 2023-10-23 (×2): qty 2

## 2023-10-23 MED ORDER — INSULIN ASPART 100 UNIT/ML IJ SOLN
0.0000 [IU] | Freq: Four times a day (QID) | INTRAMUSCULAR | Status: DC
  Administered 2023-10-23: 1 [IU] via SUBCUTANEOUS
  Administered 2023-10-24 – 2023-10-25 (×5): 2 [IU] via SUBCUTANEOUS

## 2023-10-23 MED ORDER — ONDANSETRON HCL 4 MG/2ML IJ SOLN: 4.0000 mg | Freq: Four times a day (QID) | INTRAMUSCULAR | Status: DC | PRN

## 2023-10-23 MED ORDER — APIXABAN 5 MG PO TABS
5.0000 mg | ORAL_TABLET | Freq: Two times a day (BID) | ORAL | Status: DC
  Administered 2023-10-23 – 2023-10-25 (×4): 5 mg via ORAL
  Filled 2023-10-23 (×4): qty 1

## 2023-10-23 MED ORDER — HYDRALAZINE HCL 50 MG PO TABS
100.0000 mg | ORAL_TABLET | Freq: Three times a day (TID) | ORAL | Status: DC
Start: 2023-10-23 — End: 2023-10-25
  Administered 2023-10-23 – 2023-10-25 (×5): 100 mg via ORAL
  Filled 2023-10-23 (×5): qty 2

## 2023-10-23 MED ORDER — SODIUM CHLORIDE 0.9 % IV SOLN: INTRAVENOUS | Status: DC

## 2023-10-23 MED ORDER — MORPHINE SULFATE (PF) 4 MG/ML IV SOLN
4.0000 mg | Freq: Once | INTRAVENOUS | Status: AC
  Administered 2023-10-23: 4 mg via INTRAVENOUS
  Filled 2023-10-23: qty 1

## 2023-10-23 MED ORDER — ACETAMINOPHEN 325 MG PO TABS: 650.0000 mg | ORAL_TABLET | Freq: Four times a day (QID) | ORAL | Status: DC | PRN

## 2023-10-23 MED ORDER — SODIUM CHLORIDE 0.9 % IV BOLUS
1000.0000 mL | Freq: Once | INTRAVENOUS | Status: AC
  Administered 2023-10-23: 1000 mL via INTRAVENOUS

## 2023-10-23 MED ORDER — DILTIAZEM HCL ER COATED BEADS 240 MG PO CP24
240.0000 mg | ORAL_CAPSULE | Freq: Every day | ORAL | Status: DC
  Administered 2023-10-24 – 2023-10-25 (×2): 240 mg via ORAL
  Filled 2023-10-23 (×2): qty 1

## 2023-10-23 MED ORDER — PANTOPRAZOLE SODIUM 40 MG PO TBEC
40.0000 mg | DELAYED_RELEASE_TABLET | Freq: Every day | ORAL | Status: DC
  Administered 2023-10-24 – 2023-10-25 (×2): 40 mg via ORAL
  Filled 2023-10-23 (×2): qty 1

## 2023-10-23 MED ORDER — ONDANSETRON HCL 4 MG PO TABS
4.0000 mg | ORAL_TABLET | Freq: Four times a day (QID) | ORAL | Status: DC | PRN
  Administered 2023-10-24: 4 mg via ORAL
  Filled 2023-10-23: qty 1

## 2023-10-23 NOTE — ED Triage Notes (Signed)
 Pt arrived via POV c/o recent diarrhea, since Sunday, back pain and mid-abdominal pain. Pt does report Hx of colon cancer.

## 2023-10-23 NOTE — Assessment & Plan Note (Signed)
 Stable

## 2023-10-23 NOTE — Assessment & Plan Note (Signed)
 Creatinine of 2.19.  Baseline creatinine 0.9.  Likely due to dehydration from gastroenteritis, with benazepril  use.  CT shows small nonobstructing calculi in left kidney otherwise no acute abnormality. -1 L bolus given, continue N/s 75/hr x 20hrs - Hold benazepril , spironolactone 

## 2023-10-23 NOTE — Assessment & Plan Note (Signed)
-   SSI- S Q6h - Hgab1c - Hold Metformin

## 2023-10-23 NOTE — ED Provider Notes (Signed)
  EMERGENCY DEPARTMENT AT [facility] Provider Note   CSN: [id number] Arrival date & time: 10/23/23  1441     Patient presents with: Back Pain   [name] [name] is a 83 y.o. male.   Patient is an 83 year old male who presents emergency department the chief complaint of mid back and lower abdominal pain which has been ongoing for approximate the past 3 days.  He notes that he did have diarrhea initially which has resolved.  He denies any dysuria or hematuria.  He notes he does have a history of colon cancer with previous colon resection.  He denies any associated chest pain or shortness of breath.  He has had no associated fever or chills.  He does admit to some nausea and vomiting today.  He denies any dizziness, lightheadedness or syncope.   Back Pain Associated symptoms: abdominal pain        Prior to Admission medications   Medication Sig Start Date End Date Taking? Authorizing Provider  Acetaminophen  (TYLENOL  8 HOUR PO) Take 2 tablets by mouth daily as needed.    [provider]  allopurinol  (ZYLOPRIM ) 300 MG tablet Take 450 mg by mouth in the morning. 02/07/22   [provider]  aluminum-magnesium  hydroxide 200-200 MG/5ML suspension Take 5 mLs by mouth every 6 (six) hours as needed for indigestion.    [provider]  apixaban  (ELIQUIS ) 5 MG TABS tablet Take 1 tablet (5 mg total) by mouth 2 (two) times daily for 2 days. 07/31/22 09/24/23  Pappayliou, Dorothyann A, DO  Ascorbic Acid  (VITAMIN C ) 1000 MG tablet Take 1,000 mg by mouth 2 (two) times daily.    [provider]  atorvastatin  (LIPITOR ) 80 MG tablet Take 1 tablet (80 mg total) by mouth daily. 10/03/23   Debera Jayson MATSU, MD  benazepril  (LOTENSIN ) 40 MG tablet Take 1 tablet by mouth once daily 10/02/23   Debera Jayson MATSU, MD  Blood Glucose Monitoring Suppl (ONETOUCH VERIO FLEX SYSTEM) w/Device KIT USE 1 TO CHECK GLUCOSE ONCE DAILY 03/24/23   [provider]   brimonidine (ALPHAGAN) 0.2 % ophthalmic solution 1 drop 2 (two) times daily. 10/26/22   [provider]  cyanocobalamin  1000 MCG tablet Take 1 tablet (1,000 mcg total) by mouth daily. 11/22/21   Cherlyn Labella, MD  diltiazem  (CARDIZEM  CD) 240 MG 24 hr capsule Take 240 mg by mouth daily.    [provider]  dorzolamide  (TRUSOPT ) 2 % ophthalmic solution Place 1 drop into both eyes 2 (two) times daily. 03/30/21   [provider]  FERREX 150 150 MG capsule Take 1 capsule (150 mg total) by mouth 2 (two) times daily. 07/29/22   Pearlean Manus, MD  hydrALAZINE  (APRESOLINE ) 100 MG tablet Take 1 tablet (100 mg total) by mouth 3 (three) times daily. 06/07/23   Debera Jayson MATSU, MD  Lancets [facility] DELICA PLUS [place]) MISC SMARTSIG:1 Topical Daily 03/24/23   [provider]  loperamide (IMODIUM) 2 MG capsule Take 2 mg by mouth as needed for diarrhea or loose stools.    [provider]  metFORMIN  (GLUCOPHAGE ) 500 MG tablet Take 1 tablet (500 mg total) by mouth 2 (two) times daily. 07/29/22   Pearlean Manus, MD  metoprolol  succinate (TOPROL -XL) 25 MG 24 hr tablet Take 1 tablet (25 mg total) by mouth daily. 07/30/22   Pearlean Manus, MD  Multiple Vitamin (MULTIVITAMIN) tablet Take 1 tablet by mouth in the morning.    [provider]  AISHA FLING test  strip USE 1 STRIP TO CHECK GLUCOSE ONCE DAILY 06/06/21   [provider]  pantoprazole  (PROTONIX ) 40 MG tablet Take 1 tablet (40 mg total) by mouth daily. 07/29/22   Pearlean Manus, MD  spironolactone  (ALDACTONE ) 25 MG tablet Take 0.5 tablets (12.5 mg total) by mouth daily. 06/07/23   Debera Jayson MATSU, MD    Allergies: Amiodarone     Review of Systems  Gastrointestinal:  Positive for abdominal pain.  Musculoskeletal:  Positive for back pain.    Updated Vital Signs BP (!) 144/57   Pulse 66   Temp 98.9 F (37.2 C) (Oral)   Resp 17   Ht 5' 11 (1.803 m)   Wt 99.6 kg   SpO2 94%   BMI 30.62  kg/m   Physical Exam Vitals and nursing note reviewed.  Constitutional:      Appearance: Normal appearance.  HENT:     Head: Normocephalic and atraumatic.     Nose: Nose normal.     Mouth/Throat:     Mouth: Mucous membranes are moist.  Eyes:     Extraocular Movements: Extraocular movements intact.     Conjunctiva/sclera: Conjunctivae normal.     Pupils: Pupils are equal, round, and reactive to light.  Cardiovascular:     Rate and Rhythm: Normal rate and regular rhythm.     Pulses: Normal pulses.     Heart sounds: Normal heart sounds. No murmur heard.    No gallop.  Pulmonary:     Effort: Pulmonary effort is normal. No respiratory distress.     Breath sounds: Normal breath sounds. No stridor. No wheezing, rhonchi or rales.  Abdominal:     General: Abdomen is flat. Bowel sounds are normal. There is no distension.     Palpations: Abdomen is soft.     Tenderness: There is no guarding.     Comments: Tender to palpation noted over lower abdomen  Musculoskeletal:        General: Normal range of motion.     Cervical back: Normal range of motion and neck supple. No rigidity or tenderness.     Right lower leg: No edema.     Left lower leg: No edema.  Skin:    General: Skin is warm and dry.     Findings: No bruising or rash.  Neurological:     General: No focal deficit present.     Mental Status: He is alert and oriented to person, place, and time. Mental status is at baseline.     Cranial Nerves: No cranial nerve deficit.     Sensory: No sensory deficit.     Motor: No weakness.     Coordination: Coordination normal.     Gait: Gait normal.  Psychiatric:        Mood and Affect: Mood normal.        Behavior: Behavior normal.        Thought Content: Thought content normal.        Judgment: Judgment normal.     (all labs ordered are listed, but only abnormal results are displayed) Labs Reviewed  COMPREHENSIVE METABOLIC PANEL WITH GFR - Abnormal; Notable for the following  components:      Result Value   Sodium 134 (*)    CO2 18 (*)    Glucose, Bld 198 (*)    BUN 27 (*)    Creatinine, Ser 2.19 (*)    GFR, Estimated 29 (*)    All other components within normal limits  URINALYSIS, ROUTINE  W REFLEX MICROSCOPIC - Abnormal; Notable for the following components:   Glucose, UA 50 (*)    Hgb urine dipstick LARGE (*)    Protein, ur 30 (*)    Bacteria, UA RARE (*)    All other components within normal limits  CBC WITH DIFFERENTIAL/PLATELET - Abnormal; Notable for the following components:   Platelets 138 (*)    Neutro Abs 7.8 (*)    Abs Granulocyte 7.8 (*)    All other components within normal limits  BRAIN NATRIURETIC PEPTIDE - Abnormal; Notable for the following components:   B Natriuretic Peptide 215.0 (*)    All other components within normal limits  LIPASE, BLOOD    EKG: None  Radiology: No results found.   Procedures   Medications Ordered in the ED  sodium chloride  0.9 % bolus 1,000 mL (1,000 mLs Intravenous New Bag/Given 10/23/23 1641)  morphine  (PF) 4 MG/ML injection 4 mg (4 mg Intravenous Given 10/23/23 1641)  ondansetron  (ZOFRAN ) injection 4 mg (4 mg Intravenous Given 10/23/23 1641)                                    Medical Decision Making Amount and/or Complexity of Data Reviewed Labs: ordered. Radiology: ordered.  Risk Prescription drug management. Decision regarding hospitalization.   This patient presents to the ED for concern of abdominal pain, back pain, this involves an extensive number of treatment options, and is a complaint that carries with it a high risk of complications and morbidity.  The differential diagnosis includes acute appendicitis, cholecystitis, obstruction, diverticulitis, testicular torsion, pyelonephritis, kidney stone, pancreatitis, mesenteric ischemia, cauda equina syndrome, vertebral mellitus, epidural abscess   Co morbidities that complicate the patient evaluation  Previous colon  cancer   Additional history obtained:  Additional history obtained from family External records from outside source obtained and reviewed including medical records   Lab Tests:  I Ordered, and personally interpreted labs.  The pertinent results include: No leukocytosis, no anemia, thrombocytopenia noted, elevated creatinine, mild hyponatremia, elevated glucose, normal liver function, elevated BNP, urinalysis with large hemoglobin, negative lipase   Imaging Studies ordered:  I ordered imaging studies including CT scan of the abdomen pelvis without contrast I independently visualized and interpreted imaging which showed no acute surgical process I agree with the radiologist interpretation    Consultations Obtained:  I requested consultation with the hospitalist,  and discussed lab and imaging findings as well as pertinent plan - they recommend: [place]   Problem List / ED Course / Critical interventions / Medication management  Patient is doing well at this time.  Discussed with patient that we will plan for admission to the hospital service given his ongoing abdominal pain and acute kidney injury.  He has been started on IV fluids.  Did give a dose of Rocephin  to cover for possible hemorrhagic cystitis and did send urine for culture.  Vital signs are stable at this time with no indication for sepsis.  CT scan of the abdomen pelvis demonstrated no acute surgical process.  Did discuss patient case with Dr. FORBES Carwin who accepted for admission. I ordered medication including morphine , Zofran , IV fluids, Rocephin  for abdominal pain and possible hemorrhagic cystitis Reevaluation of the patient after these medicines showed that the patient improved I have reviewed the patients home medicines and have made adjustments as needed   Social Determinants of Health:  None   Test / Admission -  Considered:  Admission     Final diagnoses:  None    ED Discharge Orders     None           Daralene Lonni JONETTA DEVONNA 10/23/23 1835    Towana Ozell BROCKS, MD 10/24/23 [phone]

## 2023-10-23 NOTE — Assessment & Plan Note (Signed)
 Was 2022.  Follows with Dr. Rogers.  Status post right hemicolectomy 05/20/2021, and adjuvant Xeloda .  Completed therapy 01/2022.  Currently under surveillance.

## 2023-10-23 NOTE — Plan of Care (Signed)
  Problem: Pain Managment: Goal: General experience of comfort will improve and/or be controlled Outcome: Progressing

## 2023-10-23 NOTE — Telephone Encounter (Signed)
 Patient's wife called stating that patient has had progressive abdominal pain off and on since Sunday.  Was associated with some diarrhea at the time, however has subsided.  Pain in mid abdomen has increased to 10/10 and patient feels unwell in general.  Denies fever.  Advised to go to the ER for evaluation since pain is so severe.  Verbalized understanding.

## 2023-10-23 NOTE — Assessment & Plan Note (Addendum)
 Stable. -Resume diltiazem , metoprolol , hydralazine  -Hold benazepril , spironolactone 

## 2023-10-23 NOTE — H&P (Signed)
 History and Physical    [name] [name] FMW:[id number] DOB: 02/04/1941 DOA: 10/23/2023  PCP: Toribio Jerel MATSU, MD   Patient coming from: Home  I have personally briefly reviewed patient's old medical records in [facility]  Chief Complaint: Abdominal Pain  HPI: [name] [name] is a 83 y.o. male with medical history significant for diabetes mellitus, paroxysmal atrial fibrillation, hypertension, colon cancer, coronary artery disease. Abdominal pain started 2 days ago- 7/27, it has been intermittent since onset, he also reports associated back pain.  He reports multiple episodes of loose stool initially for which he took Imodium yesterday and the day before.  He has not had any bowel movements today.  He reports barely any oral intake or hydration in the past 2 days. Reports 1 episode of burning with urination here in the ED but that subsequently resolved.  No fevers.  ED Course: Temperature 98.9.  Heart rate 60-75.  Respiratory rate 16-18, blood pressure systolic 132-179.  O2 sats greater than 91% on room air. Creatinine elevated at 2.19. UA-rare bacteria, hemoglobin.  Urine cultures ordered CTAP Wo contrast-acute inflammatory process, no metastatic disease. 1 L bolus given.  IV ceftriaxone  given for probable hemorrhagic cystitis  Review of Systems: As per HPI all other systems reviewed and negative.  Past Medical History:  Diagnosis Date   Anemia    Arthritis    CAD (coronary artery disease)    DES to circumflex 12/2017   Colon cancer (HCC) 2023   Diabetes mellitus without complication (HCC)    Dysrhythmia    GERD (gastroesophageal reflux disease)    Glaucoma    Gout    Headache    History of kidney stones    Hypertension    NSTEMI (non-ST elevated myocardial infarction) (HCC) 12/31/2017   Paroxysmal atrial fibrillation (HCC)    Pericardial effusion    Idiopathic, recurrent pericardial effusion s/p pericardial window.   Prostate cancer (HCC) 2007   S/P Seed implants     Supraventricular tachycardia (HCC)    Temporal arteritis (HCC)     Past Surgical History:  Procedure Laterality Date   BIOPSY  10/12/2020   Procedure: BIOPSY;  Surgeon: Eartha Angelia Toribio, MD;  [facility]: [facility];  Service: Gastroenterology;;   BIOPSY  02/25/2021   Procedure: BIOPSY;  Surgeon: Eartha Angelia Toribio, MD;  [facility];  Service: Gastroenterology;;   BIOPSY  04/11/2021   Procedure: BIOPSY;  Surgeon: Wilhelmenia Aloha Raddle., MD;  Location: [facility];  Service: Gastroenterology;;   CATARACT EXTRACTION W/PHACO Left 06/07/2015   Procedure: CATARACT EXTRACTION PHACO AND INTRAOCULAR LENS PLACEMENT LEFT EYE CDE=8.00;  Surgeon: Cherene Mania, MD;  Location: [facility];  Service: Ophthalmology;  Laterality: Left;   CATARACT EXTRACTION W/PHACO Right 06/17/2015   Procedure: CATARACT EXTRACTION PHACO AND INTRAOCULAR LENS PLACEMENT RIGHT EYE CDE=11.09;  Surgeon: Cherene Mania, MD;  Location: [facility];  Service: Ophthalmology;  Laterality: Right;   COLONOSCOPY WITH PROPOFOL  N/A 10/12/2020   Procedure: COLONOSCOPY WITH PROPOFOL ;  Surgeon: Eartha Angelia Toribio, MD;  [facility];  Service: Gastroenterology;  Laterality: N/A;  10:35   COLONOSCOPY WITH PROPOFOL  N/A 02/25/2021   Procedure: COLONOSCOPY WITH PROPOFOL ;  Surgeon: Eartha Angelia Toribio, MD;  [facility];  Service: Gastroenterology;  Laterality: N/A;  8:10   COLONOSCOPY WITH PROPOFOL  N/A 07/24/2022   Procedure: COLONOSCOPY WITH PROPOFOL ;  Surgeon: Mansouraty, Aloha Raddle., MD;  Location: [facility];  Service: Gastroenterology;  Laterality: N/A;   CORONARY ANGIOPLASTY WITH STENT PLACEMENT  01/01/2018   CORONARY STENT INTERVENTION N/A 01/01/2018   Procedure: CORONARY STENT INTERVENTION;  Surgeon: Dann Candyce RAMAN, MD;  Location: [facility];  Service: Cardiovascular;  Laterality: N/A;   ENDOSCOPIC MUCOSAL RESECTION N/A 04/11/2021   Procedure: ENDOSCOPIC MUCOSAL RESECTION;  Surgeon:  Wilhelmenia Aloha Raddle., MD;  Location: [facility];  Service: Gastroenterology;  Laterality: N/A;   ENTEROSCOPY N/A 10/12/2020   Procedure: PUSH ENTEROSCOPY;  Surgeon: Eartha Angelia Sieving, MD;  [facility];  Service: Gastroenterology;  Laterality: N/A;   ENTEROSCOPY N/A 04/11/2021   Procedure: ENTEROSCOPY;  Surgeon: Wilhelmenia Aloha Raddle., MD;  Location: [facility];  Service: Gastroenterology;  Laterality: N/A;   ENTEROSCOPY N/A 07/24/2022   Procedure: ENTEROSCOPY;  Surgeon: Wilhelmenia Aloha Raddle., MD;  Location: [facility];  Service: Gastroenterology;  Laterality: N/A;   ESOPHAGOGASTRODUODENOSCOPY (EGD) WITH PROPOFOL  N/A 10/12/2020   Procedure: ESOPHAGOGASTRODUODENOSCOPY (EGD) WITH PROPOFOL ;  Surgeon: Eartha Angelia Sieving, MD;  [facility];  Service: Gastroenterology;  Laterality: N/A;   FRACTURE SURGERY Left 2010   ankle   HEMOSTASIS CLIP PLACEMENT  04/11/2021   Procedure: HEMOSTASIS CLIP PLACEMENT;  Surgeon: Wilhelmenia Aloha Raddle., MD;  Location: [facility];  Service: Gastroenterology;;   INSERTION PROSTATE RADIATION SEED  2007   KNEE ARTHROSCOPY Left    LAPAROSCOPY N/A 05/22/2021   Procedure: LAPAROSCOPY DIAGNOSTIC WITH WASHOUT OF ABDOMEN;  Surgeon: Debby Hila, MD;  Location: [facility];  Service: General;  Laterality: N/A;   LEFT HEART CATH AND CORONARY ANGIOGRAPHY N/A 01/01/2018   Procedure: LEFT HEART CATH AND CORONARY ANGIOGRAPHY;  Surgeon: Dann Candyce RAMAN, MD;  Location: [facility];  Service: Cardiovascular;  Laterality: N/A;   ORIF TIBIA & FIBULA FRACTURES Left 2003   Distal tibial/fibula    PERICARDIAL WINDOW  02/2007   PERICARDIOCENTESIS  2007   hx/notes 10/19/2011   POLYPECTOMY  10/12/2020   Procedure: POLYPECTOMY;  Surgeon: Eartha Angelia Sieving, MD;  [facility];  Service: Gastroenterology;;  small bowel, cecal   POLYPECTOMY  02/25/2021   Procedure: POLYPECTOMY;  Surgeon: Eartha Angelia Sieving, MD;   [facility];  Service: Gastroenterology;;  transverse colon x2   POLYPECTOMY  07/24/2022   Procedure: POLYPECTOMY;  Surgeon: Wilhelmenia Aloha Raddle., MD;  Location: [facility];  Service: Gastroenterology;;   ROBLEY MEYER INJECTION  04/11/2021   Procedure: SUBMUCOSAL LIFTING INJECTION;  Surgeon: Wilhelmenia Aloha Raddle., MD;  Location: [facility];  Service: Gastroenterology;;   SUBMUCOSAL TATTOO INJECTION  04/11/2021   Procedure: SUBMUCOSAL TATTOO INJECTION;  Surgeon: Wilhelmenia Aloha Raddle., MD;  Location: [facility];  Service: Gastroenterology;;     reports that he quit smoking about 57 years ago. His smoking use included cigarettes. He started smoking about 65 years ago. He has a 3 pack-year smoking history. He has been exposed to tobacco smoke. He has never used smokeless tobacco. He reports that he does not currently use alcohol . He reports that he does not use drugs.  Allergies  Allergen Reactions   Amiodarone  Rash    Family History  Problem Relation Age of Onset   CAD Mother        MI in 24s   Prostate cancer Father    Prostate cancer Brother    Lung cancer Brother     Prior to Admission medications   Medication Sig Start Date End Date Taking? Authorizing Provider  Acetaminophen  (TYLENOL  8 HOUR PO) Take 2 tablets by mouth daily as needed.    [provider]  allopurinol  (ZYLOPRIM ) 300  MG tablet Take 450 mg by mouth in the morning. 02/07/22   [provider]  aluminum-magnesium  hydroxide 200-200 MG/5ML suspension Take 5 mLs by mouth every 6 (six) hours as needed for indigestion.    [provider]  apixaban  (ELIQUIS ) 5 MG TABS tablet Take 1 tablet (5 mg total) by mouth 2 (two) times daily for 2 days. 07/31/22 09/24/23  Pappayliou, Dorothyann A, DO  Ascorbic Acid  (VITAMIN C ) 1000 MG tablet Take 1,000 mg by mouth 2 (two) times daily.    [provider]  atorvastatin  (LIPITOR ) 80 MG tablet Take 1 tablet (80 mg total) by mouth  daily. 10/03/23   Debera Jayson MATSU, MD  benazepril  (LOTENSIN ) 40 MG tablet Take 1 tablet by mouth once daily 10/02/23   Debera Jayson MATSU, MD  Blood Glucose Monitoring Suppl (ONETOUCH VERIO FLEX SYSTEM) w/Device KIT USE 1 TO CHECK GLUCOSE ONCE DAILY 03/24/23   [provider]  brimonidine (ALPHAGAN) 0.2 % ophthalmic solution 1 drop 2 (two) times daily. 10/26/22   [provider]  cyanocobalamin  1000 MCG tablet Take 1 tablet (1,000 mcg total) by mouth daily. 11/22/21   Cherlyn Labella, MD  diltiazem  (CARDIZEM  CD) 240 MG 24 hr capsule Take 240 mg by mouth daily.    [provider]  dorzolamide  (TRUSOPT ) 2 % ophthalmic solution Place 1 drop into both eyes 2 (two) times daily. 03/30/21   [provider]  doxycycline  (VIBRAMYCIN ) 100 MG capsule Take 100 mg by mouth 2 (two) times daily. 09/29/23   [provider]  FERREX 150 150 MG capsule Take 1 capsule (150 mg total) by mouth 2 (two) times daily. 07/29/22   Pearlean Manus, MD  hydrALAZINE  (APRESOLINE ) 100 MG tablet Take 1 tablet (100 mg total) by mouth 3 (three) times daily. 06/07/23   Debera Jayson MATSU, MD  Lancets [facility] DELICA PLUS [place]) MISC SMARTSIG:1 Topical Daily 03/24/23   [provider]  loperamide (IMODIUM) 2 MG capsule Take 2 mg by mouth as needed for diarrhea or loose stools.    [provider]  metFORMIN  (GLUCOPHAGE -XR) 500 MG 24 hr tablet Take 1,000 mg by mouth daily. 07/31/23   [provider]  metoprolol  succinate (TOPROL -XL) 25 MG 24 hr tablet Take 1 tablet (25 mg total) by mouth daily. 07/30/22   Pearlean Manus, MD  Multiple Vitamin (MULTIVITAMIN) tablet Take 1 tablet by mouth in the morning.    [provider]  [facility] ULTRA test strip USE 1 STRIP TO CHECK GLUCOSE ONCE DAILY 06/06/21   [provider]  pantoprazole  (PROTONIX ) 40 MG tablet Take 1 tablet (40 mg total) by mouth daily. 07/29/22   Pearlean Manus, MD  spironolactone  (ALDACTONE ) 25 MG  tablet Take 0.5 tablets (12.5 mg total) by mouth daily. 06/07/23   Debera Jayson MATSU, MD    Physical Exam: Vitals:   10/23/23 1515 10/23/23 1515 10/23/23 1645  BP:  132/81 (!) 144/57  Pulse:  75 66  Resp:  16 17  Temp:  98.9 F (37.2 C)   TempSrc:  Oral   SpO2:  93% 94%  Weight: 99.6 kg    Height: 5' 11 (1.803 m)      Constitutional: NAD, calm, comfortable Vitals:   10/23/23 1515 10/23/23 1515 10/23/23 1645  BP:  132/81 (!) 144/57  Pulse:  75 66  Resp:  16 17  Temp:  98.9 F (37.2 C)   TempSrc:  Oral   SpO2:  93% 94%  Weight: 99.6 kg    Height: 5'  11 (1.803 m)     Eyes: PERRL, lids and conjunctivae normal ENMT: Mucous membranes are moist.  Neck: normal, supple, no masses, no thyromegaly Respiratory: clear to auscultation bilaterally, no wheezing, no crackles. Normal respiratory effort.   Cardiovascular: Regular rate and rhythm, no murmurs / rubs / gallops. No extremity edema.  Abdomen: no tenderness, no masses palpated. No hepatosplenomegaly. Musculoskeletal: no clubbing / cyanosis. No joint deformity upper and lower extremities.   Skin: no rashes, lesions, ulcers. No induration Neurologic: No facial asymmetry, moving extremity spontaneously, speech fluent Psychiatric: Normal judgment and insight. Alert and oriented x 3. Normal mood.   Labs on Admission: I have personally reviewed following labs and imaging studies  CBC: Recent Labs  Lab 10/23/23 1609  WBC 10.0  NEUTROABS 7.8*  HGB 13.6  HCT 40.9  MCV 96.5  PLT 138*   Basic Metabolic Panel: Recent Labs  Lab 10/23/23 1609  NA 134*  K 4.3  CL 103  CO2 18*  GLUCOSE 198*  BUN 27*  CREATININE 2.19*  CALCIUM  9.6   GFR: Estimated Creatinine Clearance: 30.7 mL/min (A) (by C-G formula based on SCr of 2.19 mg/dL (H)). Liver Function Tests: Recent Labs  Lab 10/23/23 1609  AST 28  ALT 21  ALKPHOS 84  BILITOT 0.9  PROT 7.4  ALBUMIN  4.1   Recent Labs  Lab 10/23/23 1609  LIPASE 27   Urine  analysis:    Component Value Date/Time   COLORURINE YELLOW 10/23/2023 1541   APPEARANCEUR CLEAR 10/23/2023 1541   LABSPEC 1.006 10/23/2023 1541   PHURINE 5.0 10/23/2023 1541   GLUCOSEU 50 (A) 10/23/2023 1541   HGBUR LARGE (A) 10/23/2023 1541   BILIRUBINUR NEGATIVE 10/23/2023 1541   KETONESUR NEGATIVE 10/23/2023 1541   PROTEINUR 30 (A) 10/23/2023 1541   UROBILINOGEN 1.0 01/05/2014 1427   NITRITE NEGATIVE 10/23/2023 1541   LEUKOCYTESUR NEGATIVE 10/23/2023 1541    Radiological Exams on Admission: CT ABDOMEN PELVIS WO CONTRAST Result Date: 10/23/2023 CLINICAL DATA:  Lower abdominal pain. History of colon carcinoma. * Tracking Code: BO * EXAM: CT ABDOMEN AND PELVIS WITHOUT CONTRAST TECHNIQUE: Multidetector CT imaging of the abdomen and pelvis was performed following the standard protocol without IV contrast. RADIATION DOSE REDUCTION: This exam was performed according to the departmental dose-optimization program which includes automated exposure control, adjustment of the mA and/or kV according to patient size and/or use of iterative reconstruction technique. COMPARISON:  CT scan abdomen and pelvis from 09/06/2023. FINDINGS: Lower chest: There is a stable 6 x 7 mm nodule in the left lung lower lobe, posteriorly, abutting the pleura. The lung bases are otherwise clear. No consolidation, lung mass or pleural effusion. The heart is normal in size. There is stable moderate pericardial effusion. Hepatobiliary: The liver is normal in size. Non-cirrhotic configuration. Redemonstration of multiple subcentimeter sized hypoattenuating lesions throughout the liver, which are incompletely characterized on the current exam but grossly similar to the prior study and were characterized as cysts on prior MRI abdomen from 07/26/2022. No intrahepatic or extrahepatic bile duct dilation. Gallbladder is surgically absent. Pancreas: Redemonstration of atrophy of the pancreas. There is a well-circumscribed 1.4 x 1.5 cm  hypoattenuating structure in the pancreatic neck, incompletely characterized on the current exam but unchanged since multiple prior studies. Spleen: Within normal limits. No focal lesion. Adrenals/Urinary Tract: Adrenal glands are unremarkable. No suspicious renal mass within the limitations of this unenhanced exam. Simple cyst in the left kidney interpolar region, anteriorly was better seen on the prior exam. There are  at least 2 3 mm or smaller nonobstructing calculi in the left kidney. No other nephroureterolithiasis or obstructive uropathy on either side. Unremarkable urinary bladder. Stomach/Bowel: Patient is status post right hemicolectomy. Ileocolonic anastomosis noted in the right upper abdomen. No disproportionate dilation of the small or large bowel loops. No evidence of abnormal bowel wall thickening or inflammatory changes. Vascular/Lymphatic: There is trace ascites in the dependent pelvis. No walled-off abscess or loculated collection. No pneumoperitoneum. No abdominal or pelvic lymphadenopathy, by size criteria. No aneurysmal dilation of the major abdominal arteries. There are marked peripheral atherosclerotic vascular calcifications of the aorta and its major branches. Reproductive: Multiple prostatic brachytherapy seeds noted. Other: Periumbilical surgical scar noted. There are bilateral small fat containing inguinal hernias. The soft tissues and abdominal wall are otherwise unremarkable. Musculoskeletal: No suspicious osseous lesions. There are mild multilevel degenerative changes in the visualized spine. IMPRESSION: 1. No acute inflammatory process identified within the abdomen or pelvis. 2. No metastatic disease identified within the abdomen or pelvis. 3. Multiple other nonacute observations, as described above. Aortic Atherosclerosis (ICD10-I70.0). Electronically Signed   By: Ree Molt M.D.   On: 10/23/2023 17:38   EKG: None.   Assessment/Plan Principal Problem:   AKI (acute kidney  injury) (HCC) Active Problems:   PAF (paroxysmal atrial fibrillation) (HCC)   Uncontrolled type 2 diabetes mellitus with hyperglycemia, without long-term current use of insulin  (HCC)   Gastroenteritis   CAD (coronary artery disease)   Colon cancer (HCC)   Essential hypertension  Assessment and Plan: * AKI (acute kidney injury) (HCC) Creatinine of 2.19.  Baseline creatinine 0.9.  Likely due to dehydration from gastroenteritis, with benazepril  use.  CT shows small nonobstructing calculi in left kidney otherwise no acute abnormality. -1 L bolus given, continue N/s 75/hr x 20hrs - Hold benazepril , spironolactone   Gastroenteritis Presenting with abdominal pain, diarrhea and vomiting.  He took Imodium 2ce, no bowel movement today.  CT without acute abnormality. -Hydrate -Zofran  as needed - Reports 1 episode of dysuria here in the ED, subsequently resolved.  UA with only rare bacteria.  CT with normal urinary bladder.  Will hold off on further antibiotics at this time.   Uncontrolled type 2 diabetes mellitus with hyperglycemia, without long-term current use of insulin  (HCC) - SSI- S Q6h - Hgab1c - Hold Metformin   PAF (paroxysmal atrial fibrillation) (HCC) On Eliquis , diltiazem  and metoprolol . -Resume   Essential hypertension Stable. -Resume diltiazem , metoprolol , hydralazine  -Hold benazepril , spironolactone   Colon cancer (HCC) Was 2022.  Follows with Dr. Rogers.  Status post right hemicolectomy 05/20/2021, and adjuvant Xeloda .  Completed therapy 01/2022.  Currently under surveillance.  CAD (coronary artery disease) Stable.   DVT prophylaxis: Eliquis  Code Status:  FULL -confirmed with patient and family at bedside Family Communication: Spouse and daughter Bascom at bedside. Disposition Plan: ~ 2 days Consults called: None Admission status:  Obs Med surg    Author: Tully FORBES Carwin, MD 10/23/2023 9:07 PM  For on call review [email or web address].

## 2023-10-23 NOTE — Assessment & Plan Note (Signed)
 On Eliquis , diltiazem  and metoprolol . -Resume

## 2023-10-23 NOTE — Assessment & Plan Note (Addendum)
 Presenting with abdominal pain, diarrhea and vomiting.  He took Imodium 2ce, no bowel movement today.  CT without acute abnormality. -Hydrate -Zofran  as needed - Reports 1 episode of dysuria here in the ED, subsequently resolved.  UA with only rare bacteria.  CT with normal urinary bladder.  Will hold off on further antibiotics at this time.

## 2023-10-24 DIAGNOSIS — N179 Acute kidney failure, unspecified: Secondary | ICD-10-CM | POA: Diagnosis not present

## 2023-10-24 LAB — HEMOGLOBIN A1C
Hgb A1c MFr Bld: 7.4 % — ABNORMAL HIGH (ref 4.8–5.6)
Mean Plasma Glucose: 165.68 mg/dL

## 2023-10-24 LAB — CBC
HCT: 37 % — ABNORMAL LOW (ref 39.0–52.0)
Hemoglobin: 11.9 g/dL — ABNORMAL LOW (ref 13.0–17.0)
MCH: 31.2 pg (ref 26.0–34.0)
MCHC: 32.2 g/dL (ref 30.0–36.0)
MCV: 96.9 fL (ref 80.0–100.0)
Platelets: 125 K/uL — ABNORMAL LOW (ref 150–400)
RBC: 3.82 MIL/uL — ABNORMAL LOW (ref 4.22–5.81)
RDW: 14.6 % (ref 11.5–15.5)
WBC: 7.7 K/uL (ref 4.0–10.5)
nRBC: 0 % (ref 0.0–0.2)

## 2023-10-24 LAB — BASIC METABOLIC PANEL WITH GFR
Anion gap: 10 (ref 5–15)
BUN: 23 mg/dL (ref 8–23)
CO2: 21 mmol/L — ABNORMAL LOW (ref 22–32)
Calcium: 9 mg/dL (ref 8.9–10.3)
Chloride: 109 mmol/L (ref 98–111)
Creatinine, Ser: 1.65 mg/dL — ABNORMAL HIGH (ref 0.61–1.24)
GFR, Estimated: 41 mL/min — ABNORMAL LOW (ref >60)
Glucose, Bld: 157 mg/dL — ABNORMAL HIGH (ref 70–99)
Potassium: 3.8 mmol/L (ref 3.5–5.1)
Sodium: 140 mmol/L (ref 135–145)

## 2023-10-24 LAB — GLUCOSE, CAPILLARY
Glucose-Capillary: 153 mg/dL — ABNORMAL HIGH (ref 70–99)
Glucose-Capillary: 188 mg/dL — ABNORMAL HIGH (ref 70–99)
Glucose-Capillary: 180 mg/dL — ABNORMAL HIGH (ref 70–99)
Glucose-Capillary: 156 mg/dL — ABNORMAL HIGH (ref 70–99)

## 2023-10-24 MED ORDER — SODIUM CHLORIDE 0.9 % IV SOLN: INTRAVENOUS | Status: AC

## 2023-10-24 NOTE — Progress Notes (Addendum)
 Mobility Specialist Progress Note:    10/24/23 1216  Mobility  Activity Ambulated with assistance  Level of Assistance Standby assist, set-up cues, supervision of patient - no hands on  Assistive Device Front wheel walker  Distance Ambulated (ft) 100 ft  Range of Motion/Exercises Active;All extremities  Activity Response Tolerated well  Mobility Referral Yes  Mobility visit 1 Mobility  Mobility Specialist Start Time (ACUTE ONLY) 1143  Mobility Specialist Stop Time (ACUTE ONLY) 1205  Mobility Specialist Time Calculation (min) (ACUTE ONLY) 22 min   Pt received in chair, family in room. Eager for mobility, required CGA/SBA to stand and ambulate with RW. Tolerated well, asx throughout. Returned to room, left pt supine. All needs met.   Sherrilee Ditty Mobility Specialist Please contact via Special educational needs teacher or  Rehab office at [phone]

## 2023-10-24 NOTE — Plan of Care (Signed)

## 2023-10-24 NOTE — Progress Notes (Signed)
 PROGRESS NOTE    [name] [name]  FMW:[id number] DOB: 25-Aug-1940 DOA: 10/23/2023 PCP: Toribio Jerel MATSU, MD   Brief Narrative:    [name] [name] is a 83 y.o. male with medical history significant for diabetes mellitus, paroxysmal atrial fibrillation, hypertension, colon cancer, coronary artery disease. Abdominal pain started 2 days ago- 7/27, it has been intermittent since onset, he also reports associated back pain.  He reports multiple episodes of loose stool initially for which he took Imodium yesterday and the day before.  He has not had any bowel movements today.  He reports barely any oral intake or hydration in the past 2 days.  Assessment & Plan:   Principal Problem:   AKI (acute kidney injury) (HCC) Active Problems:   PAF (paroxysmal atrial fibrillation) (HCC)   Uncontrolled type 2 diabetes mellitus with hyperglycemia, without long-term current use of insulin  (HCC)   Gastroenteritis   CAD (coronary artery disease)   Colon cancer (HCC)   Essential hypertension  Assessment and Plan:  AKI (acute kidney injury) (HCC)-improving Creatinine of 2.19.  Baseline creatinine 0.9.  Likely due to dehydration from gastroenteritis, with benazepril  use.  CT shows small nonobstructing calculi in left kidney otherwise no acute abnormality. - continue IV fluid through today with creatinine 1.65 -Hold nephrotoxic agents   Gastroenteritis-improving Presenting with abdominal pain, diarrhea and vomiting.  He took Imodium 2ce, no bowel movement today.  CT without acute abnormality. -Hydrate -Zofran  as needed - Reports 1 episode of dysuria here in the ED, subsequently resolved.  UA with only rare bacteria.  CT with normal urinary bladder.  Will hold off on further antibiotics at this time.  -Advance diet during lunch and monitor for tolerance  Worsening anemia -No overt bleeding -Appears to be hemodilutional - Continue to monitor CBC in a.m.   Uncontrolled type 2 diabetes mellitus with  hyperglycemia, without long-term current use of insulin  (HCC) - SSI- S Q6h - Hgab1c - Hold Metformin    PAF (paroxysmal atrial fibrillation) (HCC) On Eliquis , diltiazem  and metoprolol . -Resume    Essential hypertension Stable. -Resume diltiazem , metoprolol , hydralazine  -Hold benazepril , spironolactone    Colon cancer (HCC) Was 2022.  Follows with Dr. Rogers.  Status post right hemicolectomy 05/20/2021, and adjuvant Xeloda .  Completed therapy 01/2022.  Currently under surveillance.   CAD (coronary artery disease) Stable.    DVT prophylaxis:apixaban  Code Status: Full Family Communication: Spouse and daughter at bedside Disposition Plan:  Status is: Observation The patient will require care spanning > 2 midnights and should be moved to inpatient because: Need for ongoing IV fluid and monitoring for oral intake.  Consultants:  None  Procedures:  None  Antimicrobials:  Anti-infectives (From admission, onward)    Start     Dose/Rate Route Frequency Ordered Stop   10/23/23 1800  cefTRIAXone  (ROCEPHIN ) 1 g in sodium chloride  0.9 % 100 mL IVPB        1 g 200 mL/hr over 30 Minutes Intravenous  Once 10/23/23 1750 10/23/23 1853       Subjective: Patient seen and evaluated today with no further nausea, vomiting, or diarrhea noted.  Creatinine appears to be improving, but patient feels a little bit weak and continues to require IV fluid for further improvement in creatinine levels.  Agreeable to try diet.  Objective: Vitals:   10/23/23 2109 10/24/23 0030 10/24/23 0412 10/24/23 0801  BP:  (!) 136/49 (!) 136/52 (!) 155/55  Pulse:  62 60 (!) 59  Resp:  18 17 16   Temp:  98 F (  36.7 C) 98.6 F (37 C) 98 F (36.7 C)  TempSrc:  Oral Oral Oral  SpO2: 94% 94% 93% 94%  Weight:      Height:        Intake/Output Summary (Last 24 hours) at 10/24/2023 1211 Last data filed at 10/24/2023 1133 Gross per 24 hour  Intake 1736.57 ml  Output 1350 ml  Net 386.57 ml   Filed Weights    10/23/23 1515 10/23/23 2002  Weight: 99.6 kg 95.1 kg    Examination:  General exam: Appears calm and comfortable  Respiratory system: Clear to auscultation. Respiratory effort normal. Cardiovascular system: S1 & S2 heard, RRR.  Gastrointestinal system: Abdomen is soft Central nervous system: Alert and awake Extremities: No edema Skin: No significant lesions noted Psychiatry: Flat affect.    Data Reviewed: I have personally reviewed following labs and imaging studies  CBC: Recent Labs  Lab 10/23/23 1609 10/24/23 0444  WBC 10.0 7.7  NEUTROABS 7.8*  --   HGB 13.6 11.9*  HCT 40.9 37.0*  MCV 96.5 96.9  PLT 138* 125*   Basic Metabolic Panel: Recent Labs  Lab 10/23/23 1609 10/24/23 0444  NA 134* 140  K 4.3 3.8  CL 103 109  CO2 18* 21*  GLUCOSE 198* 157*  BUN 27* 23  CREATININE 2.19* 1.65*  CALCIUM  9.6 9.0   GFR: Estimated Creatinine Clearance: 39.9 mL/min (A) (by C-G formula based on SCr of 1.65 mg/dL (H)). Liver Function Tests: Recent Labs  Lab 10/23/23 1609  AST 28  ALT 21  ALKPHOS 84  BILITOT 0.9  PROT 7.4  ALBUMIN  4.1   Recent Labs  Lab 10/23/23 1609  LIPASE 27   No results for input(s): AMMONIA in the last 168 hours. Coagulation Profile: No results for input(s): INR, PROTIME in the last 168 hours. Cardiac Enzymes: No results for input(s): CKTOTAL, CKMB, CKMBINDEX, TROPONINI in the last 168 hours. BNP (last 3 results) No results for input(s): PROBNP in the last 8760 hours. HbA1C: No results for input(s): HGBA1C in the last 72 hours. CBG: Recent Labs  Lab 10/23/23 2202 10/24/23 0411  GLUCAP 128* 153*   Lipid Profile: No results for input(s): CHOL, HDL, LDLCALC, TRIG, CHOLHDL, LDLDIRECT in the last 72 hours. Thyroid  Function Tests: No results for input(s): TSH, T4TOTAL, FREET4, T3FREE, THYROIDAB in the last 72 hours. Anemia Panel: No results for input(s): VITAMINB12, FOLATE, FERRITIN,  TIBC, IRON , RETICCTPCT in the last 72 hours. Sepsis Labs: No results for input(s): PROCALCITON, LATICACIDVEN in the last 168 hours.  No results found for this or any previous visit (from the past 240 hours).       Radiology Studies: CT ABDOMEN PELVIS WO CONTRAST Result Date: 10/23/2023 CLINICAL DATA:  Lower abdominal pain. History of colon carcinoma. * Tracking Code: BO * EXAM: CT ABDOMEN AND PELVIS WITHOUT CONTRAST TECHNIQUE: Multidetector CT imaging of the abdomen and pelvis was performed following the standard protocol without IV contrast. RADIATION DOSE REDUCTION: This exam was performed according to the departmental dose-optimization program which includes automated exposure control, adjustment of the mA and/or kV according to patient size and/or use of iterative reconstruction technique. COMPARISON:  CT scan abdomen and pelvis from 09/06/2023. FINDINGS: Lower chest: There is a stable 6 x 7 mm nodule in the left lung lower lobe, posteriorly, abutting the pleura. The lung bases are otherwise clear. No consolidation, lung mass or pleural effusion. The heart is normal in size. There is stable moderate pericardial effusion. Hepatobiliary: The liver is normal in size. Non-cirrhotic  configuration. Redemonstration of multiple subcentimeter sized hypoattenuating lesions throughout the liver, which are incompletely characterized on the current exam but grossly similar to the prior study and were characterized as cysts on prior MRI abdomen from 07/26/2022. No intrahepatic or extrahepatic bile duct dilation. Gallbladder is surgically absent. Pancreas: Redemonstration of atrophy of the pancreas. There is a well-circumscribed 1.4 x 1.5 cm hypoattenuating structure in the pancreatic neck, incompletely characterized on the current exam but unchanged since multiple prior studies. Spleen: Within normal limits. No focal lesion. Adrenals/Urinary Tract: Adrenal glands are unremarkable. No suspicious renal  mass within the limitations of this unenhanced exam. Simple cyst in the left kidney interpolar region, anteriorly was better seen on the prior exam. There are at least 2 3 mm or smaller nonobstructing calculi in the left kidney. No other nephroureterolithiasis or obstructive uropathy on either side. Unremarkable urinary bladder. Stomach/Bowel: Patient is status post right hemicolectomy. Ileocolonic anastomosis noted in the right upper abdomen. No disproportionate dilation of the small or large bowel loops. No evidence of abnormal bowel wall thickening or inflammatory changes. Vascular/Lymphatic: There is trace ascites in the dependent pelvis. No walled-off abscess or loculated collection. No pneumoperitoneum. No abdominal or pelvic lymphadenopathy, by size criteria. No aneurysmal dilation of the major abdominal arteries. There are marked peripheral atherosclerotic vascular calcifications of the aorta and its major branches. Reproductive: Multiple prostatic brachytherapy seeds noted. Other: Periumbilical surgical scar noted. There are bilateral small fat containing inguinal hernias. The soft tissues and abdominal wall are otherwise unremarkable. Musculoskeletal: No suspicious osseous lesions. There are mild multilevel degenerative changes in the visualized spine. IMPRESSION: 1. No acute inflammatory process identified within the abdomen or pelvis. 2. No metastatic disease identified within the abdomen or pelvis. 3. Multiple other nonacute observations, as described above. Aortic Atherosclerosis (ICD10-I70.0). Electronically Signed   By: Ree Molt M.D.   On: 10/23/2023 17:38        Scheduled Meds:  allopurinol   450 mg Oral q AM   apixaban   5 mg Oral BID   diltiazem   240 mg Oral Daily   hydrALAZINE   100 mg Oral TID   insulin  aspart  0-9 Units Subcutaneous Q6H   metoprolol  succinate  25 mg Oral Daily   pantoprazole   40 mg Oral Daily   Continuous Infusions:  sodium chloride        LOS: 0 days     Time spent: 55 minutes    Annel Zunker JONETTA Fairly, DO [facility]  If 7PM-7AM, please contact night-coverage [email or web address] 10/24/2023, 12:11 PM

## 2023-10-24 NOTE — Care Management Obs Status (Signed)
 MEDICARE OBSERVATION STATUS NOTIFICATION   Patient Details  Name: [name] [name] MRN: [id number] Date of Birth: 05-03-1940   Medicare Observation Status Notification Given:  Yes    [name] Ada 10/24/2023, 3:59 PM

## 2023-10-24 NOTE — Plan of Care (Signed)
   Problem: Activity: Goal: Risk for activity intolerance will decrease Outcome: Progressing

## 2023-10-24 NOTE — TOC CM/SW Note (Signed)
 Transition of Care [facility]) - Inpatient Brief Assessment   Patient Details  Name: JAQUIS PICKLESIMER MRN: [id number] Date of Birth: 1940/07/23  Transition of Care [facility]) CM/SW Contact:    Noreen KATHEE Cleotilde ISRAEL Phone Number: 10/24/2023, 11:44 AM   Clinical Narrative:  Transition of Care Department [facility]) has reviewed patient and no TOC needs have been identified at this time. We will continue to monitor patient advancement through interdisciplinary progression rounds. If new patient transition needs arise, please place a TOC consult.  Transition of Care Asessment: Insurance and Status: Insurance coverage has been reviewed Patient has primary care physician: Yes Home environment has been reviewed: Single Family Home Prior level of function:: Independent Prior/Current Home Services: No current home services Social Drivers of Health Review: SDOH reviewed no interventions necessary Readmission risk has been reviewed: Yes Transition of care needs: no transition of care needs at this time

## 2023-10-24 NOTE — Progress Notes (Signed)
 Mobility Specialist Progress Note:    10/24/23 1625  Mobility  Activity Ambulated with assistance  Level of Assistance Standby assist, set-up cues, supervision of patient - no hands on  Assistive Device Front wheel walker  Distance Ambulated (ft) 200 ft  Range of Motion/Exercises Active;All extremities  Activity Response Tolerated well  Mobility Referral Yes  Mobility visit 1 Mobility  Mobility Specialist Start Time (ACUTE ONLY) 1600  Mobility Specialist Stop Time (ACUTE ONLY) 1625  Mobility Specialist Time Calculation (min) (ACUTE ONLY) 25 min   Pt received in bed requesting mobility. Required SBA to stand and ambulate with RW. Tolerated well, asx throughout. Returned to room, left pt sitting EOB. Daughter at bedside, all needs met.   Sherrilee Ditty Mobility Specialist Please contact via Special educational needs teacher or  Rehab office at [phone]

## 2023-10-25 DIAGNOSIS — Z801 Family history of malignant neoplasm of trachea, bronchus and lung: Secondary | ICD-10-CM | POA: Diagnosis not present

## 2023-10-25 DIAGNOSIS — I480 Paroxysmal atrial fibrillation: Secondary | ICD-10-CM | POA: Diagnosis present

## 2023-10-25 DIAGNOSIS — Z7984 Long term (current) use of oral hypoglycemic drugs: Secondary | ICD-10-CM | POA: Diagnosis not present

## 2023-10-25 DIAGNOSIS — Z85038 Personal history of other malignant neoplasm of large intestine: Secondary | ICD-10-CM | POA: Diagnosis not present

## 2023-10-25 DIAGNOSIS — M109 Gout, unspecified: Secondary | ICD-10-CM | POA: Diagnosis present

## 2023-10-25 DIAGNOSIS — Z8042 Family history of malignant neoplasm of prostate: Secondary | ICD-10-CM | POA: Diagnosis not present

## 2023-10-25 DIAGNOSIS — E860 Dehydration: Secondary | ICD-10-CM | POA: Diagnosis present

## 2023-10-25 DIAGNOSIS — Z955 Presence of coronary angioplasty implant and graft: Secondary | ICD-10-CM | POA: Diagnosis not present

## 2023-10-25 DIAGNOSIS — K219 Gastro-esophageal reflux disease without esophagitis: Secondary | ICD-10-CM | POA: Diagnosis present

## 2023-10-25 DIAGNOSIS — Z9221 Personal history of antineoplastic chemotherapy: Secondary | ICD-10-CM | POA: Diagnosis not present

## 2023-10-25 DIAGNOSIS — Z79899 Other long term (current) drug therapy: Secondary | ICD-10-CM | POA: Diagnosis not present

## 2023-10-25 DIAGNOSIS — I2510 Atherosclerotic heart disease of native coronary artery without angina pectoris: Secondary | ICD-10-CM | POA: Diagnosis present

## 2023-10-25 DIAGNOSIS — Z7901 Long term (current) use of anticoagulants: Secondary | ICD-10-CM | POA: Diagnosis not present

## 2023-10-25 DIAGNOSIS — Z9049 Acquired absence of other specified parts of digestive tract: Secondary | ICD-10-CM | POA: Diagnosis not present

## 2023-10-25 DIAGNOSIS — I252 Old myocardial infarction: Secondary | ICD-10-CM | POA: Diagnosis not present

## 2023-10-25 DIAGNOSIS — Z8249 Family history of ischemic heart disease and other diseases of the circulatory system: Secondary | ICD-10-CM | POA: Diagnosis not present

## 2023-10-25 DIAGNOSIS — N200 Calculus of kidney: Secondary | ICD-10-CM | POA: Diagnosis present

## 2023-10-25 DIAGNOSIS — K529 Noninfective gastroenteritis and colitis, unspecified: Secondary | ICD-10-CM | POA: Diagnosis present

## 2023-10-25 DIAGNOSIS — N179 Acute kidney failure, unspecified: Secondary | ICD-10-CM | POA: Diagnosis not present

## 2023-10-25 DIAGNOSIS — I10 Essential (primary) hypertension: Secondary | ICD-10-CM | POA: Diagnosis present

## 2023-10-25 DIAGNOSIS — E871 Hypo-osmolality and hyponatremia: Secondary | ICD-10-CM | POA: Diagnosis present

## 2023-10-25 DIAGNOSIS — E1165 Type 2 diabetes mellitus with hyperglycemia: Secondary | ICD-10-CM | POA: Diagnosis present

## 2023-10-25 DIAGNOSIS — Z8546 Personal history of malignant neoplasm of prostate: Secondary | ICD-10-CM | POA: Diagnosis not present

## 2023-10-25 DIAGNOSIS — Z87891 Personal history of nicotine dependence: Secondary | ICD-10-CM | POA: Diagnosis not present

## 2023-10-25 LAB — BASIC METABOLIC PANEL WITH GFR
Anion gap: 7 (ref 5–15)
BUN: 17 mg/dL (ref 8–23)
CO2: 21 mmol/L — ABNORMAL LOW (ref 22–32)
Calcium: 8.7 mg/dL — ABNORMAL LOW (ref 8.9–10.3)
Chloride: 109 mmol/L (ref 98–111)
Creatinine, Ser: 1.21 mg/dL (ref 0.61–1.24)
GFR, Estimated: 59 mL/min — ABNORMAL LOW (ref >60)
Glucose, Bld: 156 mg/dL — ABNORMAL HIGH (ref 70–99)
Potassium: 3.8 mmol/L (ref 3.5–5.1)
Sodium: 137 mmol/L (ref 135–145)

## 2023-10-25 LAB — URINE CULTURE: Culture: NO GROWTH

## 2023-10-25 LAB — GLUCOSE, CAPILLARY: Glucose-Capillary: 169 mg/dL — ABNORMAL HIGH (ref 70–99)

## 2023-10-25 LAB — CBC
HCT: 36.6 % — ABNORMAL LOW (ref 39.0–52.0)
Hemoglobin: 11.8 g/dL — ABNORMAL LOW (ref 13.0–17.0)
MCH: 31.1 pg (ref 26.0–34.0)
MCHC: 32.2 g/dL (ref 30.0–36.0)
MCV: 96.6 fL (ref 80.0–100.0)
Platelets: 120 K/uL — ABNORMAL LOW (ref 150–400)
RBC: 3.79 MIL/uL — ABNORMAL LOW (ref 4.22–5.81)
RDW: 14.4 % (ref 11.5–15.5)
WBC: 7.6 K/uL (ref 4.0–10.5)
nRBC: 0 % (ref 0.0–0.2)

## 2023-10-25 LAB — MAGNESIUM: Magnesium: 1.9 mg/dL (ref 1.7–2.4)

## 2023-10-25 MED ORDER — BENAZEPRIL HCL 40 MG PO TABS: 40.0000 mg | ORAL_TABLET | Freq: Every day | ORAL | 0 refills | Status: AC

## 2023-10-25 MED ORDER — SPIRONOLACTONE 25 MG PO TABS: 12.5000 mg | ORAL_TABLET | Freq: Every day | ORAL | 1 refills | Status: AC

## 2023-10-25 NOTE — Discharge Summary (Signed)
 Physician Discharge Summary  [name]:[id number] DOB: [birth year] DOA: 10/23/2023  PCP: Toribio Jerel MATSU, MD  Admit date: 10/23/2023  Discharge date: 10/25/2023  Admitted From:Home  Disposition:  Home  Recommendations for Outpatient Follow-up:  Follow up with PCP in 1-2 weeks and repeat BMP in 1 week. Okay to resume ACE inhibitor and spironolactone  on 8/2 Continue other home medications as prior  Home Health: None  Equipment/Devices: None  Discharge Condition:Stable  CODE STATUS: Full  Diet recommendation: Heart Healthy/carb modified  Brief/Interim Summary: [name] [name] is a 83 y.o. male with medical history significant for diabetes mellitus, paroxysmal atrial fibrillation, hypertension, colon cancer, coronary artery disease. Abdominal pain started 2 days ago- 7/27, it has been intermittent since onset, he also reports associated back pain.  He reports multiple episodes of loose stool initially for which he took Imodium yesterday and the day before.  He has not had any bowel movements today.  He reports barely any oral intake or hydration in the past 2 days.  Patient was admitted for prerenal AKI likely in the setting of gastroenteritis which has now resolved.  He is tolerating diet and creatinine levels have improved markedly with IV fluid hydration.  He is nearing his baseline and is overall feeling well and ready for discharge.  He should follow-up with PCP for repeat BMP in 1 week.  No other acute events or concerns noted.  Discharge Diagnoses:  Principal Problem:   AKI (acute kidney injury) (HCC) Active Problems:   PAF (paroxysmal atrial fibrillation) (HCC)   Uncontrolled type 2 diabetes mellitus with hyperglycemia, without long-term current use of insulin  (HCC)   Gastroenteritis   CAD (coronary artery disease)   Colon cancer (HCC)   Essential hypertension  Principal discharge diagnosis: AKI likely prerenal in the setting of volume loss related to  gastroenteritis.  Discharge Instructions  Discharge Instructions     Diet - low sodium heart healthy   Complete by: As directed    Increase activity slowly   Complete by: As directed       Allergies as of 10/25/2023       Reactions   Amiodarone  Rash        Medication List     TAKE these medications    allopurinol  300 MG tablet Commonly known as: ZYLOPRIM  Take 450 mg by mouth in the morning.   apixaban  5 MG Tabs tablet Commonly known as: ELIQUIS  Take 1 tablet (5 mg total) by mouth 2 (two) times daily for 2 days.   atorvastatin  80 MG tablet Commonly known as: LIPITOR  Take 1 tablet (80 mg total) by mouth daily.   benazepril  40 MG tablet Commonly known as: LOTENSIN  Take 1 tablet (40 mg total) by mouth daily. Start taking on: October 27, 2023 What changed: These instructions start on October 27, 2023. If you are unsure what to do until then, ask your doctor or other care provider.   brimonidine 0.2 % ophthalmic solution Commonly known as: ALPHAGAN Place 1 drop into both eyes 2 (two) times daily.   cyanocobalamin  1000 MCG tablet Take 1 tablet (1,000 mcg total) by mouth daily.   diltiazem  240 MG 24 hr capsule Commonly known as: CARDIZEM  CD Take 240 mg by mouth daily.   dorzolamide  2 % ophthalmic solution Commonly known as: TRUSOPT  Place 1 drop into both eyes 2 (two) times daily.   hydrALAZINE  100 MG tablet Commonly known as: APRESOLINE  Take 1 tablet (100 mg total) by mouth 3 (three) times daily.  loperamide 2 MG capsule Commonly known as: IMODIUM Take 2 mg by mouth as needed for diarrhea or loose stools.   metFORMIN  500 MG 24 hr tablet Commonly known as: GLUCOPHAGE -XR Take 500 mg by mouth 2 (two) times daily with a meal.   metoprolol  succinate 25 MG 24 hr tablet Commonly known as: TOPROL -XL Take 1 tablet (25 mg total) by mouth daily.   multivitamin tablet Take 1 tablet by mouth in the morning.   OneTouch Delica Plus Lancet33G Misc SMARTSIG:1 Topical  Daily   OneTouch Ultra test strip Generic drug: glucose blood USE 1 STRIP TO CHECK GLUCOSE ONCE DAILY   OneTouch Verio Flex System w/Device Kit USE 1 TO CHECK GLUCOSE ONCE DAILY   pantoprazole  40 MG tablet Commonly known as: PROTONIX  Take 1 tablet (40 mg total) by mouth daily.   spironolactone  25 MG tablet Commonly known as: ALDACTONE  Take 0.5 tablets (12.5 mg total) by mouth daily. Start taking on: October 27, 2023 What changed: These instructions start on October 27, 2023. If you are unsure what to do until then, ask your doctor or other care provider.   TYLENOL  8 HOUR PO Take 2 tablets by mouth daily as needed (pain).        Follow-up Information     Toribio Jerel MATSU, MD. Schedule an appointment as soon as possible for a visit in 1 week(s).   Specialty: Family Medicine Contact information: [address] [place] [address] [phone]                Allergies  Allergen Reactions   Amiodarone  Rash    Consultations: None   Procedures/Studies: CT ABDOMEN PELVIS WO CONTRAST Result Date: 10/23/2023 CLINICAL DATA:  Lower abdominal pain. History of colon carcinoma. * Tracking Code: BO * EXAM: CT ABDOMEN AND PELVIS WITHOUT CONTRAST TECHNIQUE: Multidetector CT imaging of the abdomen and pelvis was performed following the standard protocol without IV contrast. RADIATION DOSE REDUCTION: This exam was performed according to the departmental dose-optimization program which includes automated exposure control, adjustment of the mA and/or kV according to patient size and/or use of iterative reconstruction technique. COMPARISON:  CT scan abdomen and pelvis from 09/06/2023. FINDINGS: Lower chest: There is a stable 6 x 7 mm nodule in the left lung lower lobe, posteriorly, abutting the pleura. The lung bases are otherwise clear. No consolidation, lung mass or pleural effusion. The heart is normal in size. There is stable moderate pericardial effusion. Hepatobiliary: The liver is normal  in size. Non-cirrhotic configuration. Redemonstration of multiple subcentimeter sized hypoattenuating lesions throughout the liver, which are incompletely characterized on the current exam but grossly similar to the prior study and were characterized as cysts on prior MRI abdomen from 07/26/2022. No intrahepatic or extrahepatic bile duct dilation. Gallbladder is surgically absent. Pancreas: Redemonstration of atrophy of the pancreas. There is a well-circumscribed 1.4 x 1.5 cm hypoattenuating structure in the pancreatic neck, incompletely characterized on the current exam but unchanged since multiple prior studies. Spleen: Within normal limits. No focal lesion. Adrenals/Urinary Tract: Adrenal glands are unremarkable. No suspicious renal mass within the limitations of this unenhanced exam. Simple cyst in the left kidney interpolar region, anteriorly was better seen on the prior exam. There are at least 2 3 mm or smaller nonobstructing calculi in the left kidney. No other nephroureterolithiasis or obstructive uropathy on either side. Unremarkable urinary bladder. Stomach/Bowel: Patient is status post right hemicolectomy. Ileocolonic anastomosis noted in the right upper abdomen. No disproportionate dilation of the small or large  bowel loops. No evidence of abnormal bowel wall thickening or inflammatory changes. Vascular/Lymphatic: There is trace ascites in the dependent pelvis. No walled-off abscess or loculated collection. No pneumoperitoneum. No abdominal or pelvic lymphadenopathy, by size criteria. No aneurysmal dilation of the major abdominal arteries. There are marked peripheral atherosclerotic vascular calcifications of the aorta and its major branches. Reproductive: Multiple prostatic brachytherapy seeds noted. Other: Periumbilical surgical scar noted. There are bilateral small fat containing inguinal hernias. The soft tissues and abdominal wall are otherwise unremarkable. Musculoskeletal: No suspicious osseous  lesions. There are mild multilevel degenerative changes in the visualized spine. IMPRESSION: 1. No acute inflammatory process identified within the abdomen or pelvis. 2. No metastatic disease identified within the abdomen or pelvis. 3. Multiple other nonacute observations, as described above. Aortic Atherosclerosis (ICD10-I70.0). Electronically Signed   By: Ree Molt M.D.   On: 10/23/2023 17:38     Discharge Exam: Vitals:   10/24/23 2051 10/25/23 0523  BP: (!) 162/49 (!) 152/65  Pulse: 62 (!) 59  Resp: 19 18  Temp: 99 F (37.2 C) 98.6 F (37 C)  SpO2: 94% 93%   Vitals:   10/24/23 0801 10/24/23 1254 10/24/23 2051 10/25/23 0523  BP: (!) 155/55 (!) 147/54 (!) 162/49 (!) 152/65  Pulse: (!) 59 (!) 57 62 (!) 59  Resp: 16 18 19 18   Temp: 98 F (36.7 C) 98.8 F (37.1 C) 99 F (37.2 C) 98.6 F (37 C)  TempSrc: Oral Oral Oral Oral  SpO2: 94% 96% 94% 93%  Weight:      Height:        General: Pt is alert, awake, not in acute distress Cardiovascular: RRR, S1/S2 +, no rubs, no gallops Respiratory: CTA bilaterally, no wheezing, no rhonchi Abdominal: Soft, NT, ND, bowel sounds + Extremities: no edema, no cyanosis    The results of significant diagnostics from this hospitalization (including imaging, microbiology, ancillary and laboratory) are listed below for reference.     Microbiology: No results found for this or any previous visit (from the past 240 hours).   Labs: BNP (last 3 results) Recent Labs    10/23/23 1609  BNP 215.0*   Basic Metabolic Panel: Recent Labs  Lab 10/23/23 1609 10/24/23 0444 10/25/23 0420  NA 134* 140 137  K 4.3 3.8 3.8  CL 103 109 109  CO2 18* 21* 21*  GLUCOSE 198* 157* 156*  BUN 27* 23 17  CREATININE 2.19* 1.65* 1.21  CALCIUM  9.6 9.0 8.7*  MG  --   --  1.9   Liver Function Tests: Recent Labs  Lab 10/23/23 1609  AST 28  ALT 21  ALKPHOS 84  BILITOT 0.9  PROT 7.4  ALBUMIN  4.1   Recent Labs  Lab 10/23/23 1609  LIPASE 27    No results for input(s): AMMONIA in the last 168 hours. CBC: Recent Labs  Lab 10/23/23 1609 10/24/23 0444 10/25/23 0420  WBC 10.0 7.7 7.6  NEUTROABS 7.8*  --   --   HGB 13.6 11.9* 11.8*  HCT 40.9 37.0* 36.6*  MCV 96.5 96.9 96.6  PLT 138* 125* 120*   Cardiac Enzymes: No results for input(s): CKTOTAL, CKMB, CKMBINDEX, TROPONINI in the last 168 hours. BNP: Invalid input(s): POCBNP CBG: Recent Labs  Lab 10/24/23 0411 10/24/23 1253 10/24/23 1758 10/24/23 2210 10/25/23 0514  GLUCAP 153* 188* 180* 156* 169*   D-Dimer No results for input(s): DDIMER in the last 72 hours. Hgb A1c Recent Labs    10/23/23 1609  HGBA1C 7.4*  Lipid Profile No results for input(s): CHOL, HDL, LDLCALC, TRIG, CHOLHDL, LDLDIRECT in the last 72 hours. Thyroid  function studies No results for input(s): TSH, T4TOTAL, T3FREE, THYROIDAB in the last 72 hours.  Invalid input(s): FREET3 Anemia work up No results for input(s): VITAMINB12, FOLATE, FERRITIN, TIBC, IRON , RETICCTPCT in the last 72 hours. Urinalysis    Component Value Date/Time   COLORURINE YELLOW 10/23/2023 1541   APPEARANCEUR CLEAR 10/23/2023 1541   LABSPEC 1.006 10/23/2023 1541   PHURINE 5.0 10/23/2023 1541   GLUCOSEU 50 (A) 10/23/2023 1541   HGBUR LARGE (A) 10/23/2023 1541   BILIRUBINUR NEGATIVE 10/23/2023 1541   KETONESUR NEGATIVE 10/23/2023 1541   PROTEINUR 30 (A) 10/23/2023 1541   UROBILINOGEN 1.0 01/05/2014 1427   NITRITE NEGATIVE 10/23/2023 1541   LEUKOCYTESUR NEGATIVE 10/23/2023 1541   Sepsis Labs Recent Labs  Lab 10/23/23 1609 10/24/23 0444 10/25/23 0420  WBC 10.0 7.7 7.6   Microbiology No results found for this or any previous visit (from the past 240 hours).   Time coordinating discharge: 35 minutes  SIGNED:   Adron JONETTA Fairly, DO [facility] 10/25/2023, 10:07 AM  If 7PM-7AM, please contact night-coverage [email or web address]

## 2023-10-25 NOTE — Plan of Care (Signed)

## 2023-10-26 ENCOUNTER — Telehealth: Payer: Self-pay

## 2023-10-26 NOTE — Transitions of Care (Post Inpatient/ED Visit) (Signed)
 Today's TOC FU Call Status: Today's TOC FU Call Status:: Successful TOC FU Call Completed TOC FU Call Complete Date: 10/26/23 Patient's Name and Date of Birth confirmed.  Transition Care Management Follow-up Telephone Call Date of Discharge: 10/25/23 [facility] Type of Discharge: Inpatient Admission Primary Inpatient Discharge Diagnosis:: AKI (acute kidney injury) How have you been since you were released from the hospital?: Better Any questions or concerns?: Yes Patient Questions/Concerns:: Spouse had questions about medications pauses and diagnosis. Patient Questions/Concerns Addressed: Other: (Helped answer questions.)  Items Reviewed: Did you receive and understand the discharge instructions provided?: Yes Medications obtained,verified, and reconciled?: Yes (Medications Reviewed) Any new allergies since your discharge?: No Dietary orders reviewed?: Yes Type of Diet Ordered:: heart health/carb modified Do you have support at home?: Yes People in Home [RPT]: spouse Name of Support/Comfort Primary Source: Beharry,Eleanor (Spouse)  [phone] (Mobile)  Medications Reviewed Today: Medications Reviewed Today     Reviewed by Carolee Heron NOVAK, RN (Case Manager) on 10/26/23 at 1518  Med List Status: <None>   Medication Order Taking? Sig Documenting Provider Last Dose Status Informant  Acetaminophen  (TYLENOL  8 HOUR PO) [id number] Yes Take 2 tablets by mouth daily as needed (pain). [provider]  Active Self, Spouse/Significant Other, Pharmacy Records  allopurinol  (ZYLOPRIM ) 300 MG tablet [id number] Yes Take 450 mg by mouth in the morning. [provider]  Active Spouse/Significant Other, Self, Pharmacy Records  apixaban  (ELIQUIS ) 5 MG TABS tablet [id number] Yes Take 1 tablet (5 mg total) by mouth 2 (two) times daily for 2 days. Pappayliou, Dorothyann LABOR, DO  Active Self, Spouse/Significant Other, Pharmacy Records  atorvastatin  (LIPITOR ) 80 MG tablet  [id number] Yes Take 1 tablet (80 mg total) by mouth daily. Debera Jayson MATSU, MD  Active Self, Spouse/Significant Other, Pharmacy Records  benazepril  (LOTENSIN ) 40 MG tablet [id number]  Take 1 tablet (40 mg total) by mouth daily. Maree, Pratik D, DO  Active   Blood Glucose Monitoring Suppl (ONETOUCH VERIO FLEX SYSTEM) w/Device PRESSLEY [id number] Yes USE 1 TO CHECK GLUCOSE ONCE DAILY [provider]  Active Self, Spouse/Significant Other, Pharmacy Records  brimonidine (ALPHAGAN) 0.2 % ophthalmic solution [id number] Yes Place 1 drop into both eyes 2 (two) times daily. [provider]  Active Self, Spouse/Significant Other, Pharmacy Records  cyanocobalamin  1000 MCG tablet [id number] Yes Take 1 tablet (1,000 mcg total) by mouth daily. Akula, Vijaya, MD  Active Spouse/Significant Other, Self, Pharmacy Records  diltiazem  (CARDIZEM  CD) 240 MG 24 hr capsule [id number] Yes Take 240 mg by mouth daily. [provider]  Active Spouse/Significant Other, Self, Pharmacy Records  dorzolamide  (TRUSOPT ) 2 % ophthalmic solution [id number] Yes Place 1 drop into both eyes 2 (two) times daily. [provider]  Active Spouse/Significant Other, Self, Pharmacy Records  hydrALAZINE  (APRESOLINE ) 100 MG tablet [id number] Yes Take 1 tablet (100 mg total) by mouth 3 (three) times daily. Debera Jayson MATSU, MD  Active Self, Spouse/Significant Other, Pharmacy Records  lactated ringers  infusion [id number]   Judythe Tanda Aran, CRNA  Active   Lancets [facility] CAT[facility] PLUS [place]) [place] [id number] Yes SMARTSIG:1 Topical Daily [provider]  Active Self, Spouse/Significant Other, Pharmacy Records  loperamide (IMODIUM) 2 MG capsule [id number] Yes Take 2 mg by mouth as needed for diarrhea or loose stools. [provider]  Active Spouse/Significant Other, Self, Pharmacy Records  metFORMIN  (GLUCOPHAGE -XR) 500 MG 24 hr tablet [id number]  Take 500 mg by mouth 2 (two) times daily with a meal. [provider]  Active Self, Spouse/Significant  Other, Pharmacy Records  metoprolol  succinate (TOPROL -XL) 25 MG 24 hr tablet [id number] Yes Take 1 tablet (25 mg total) by mouth daily. Pearlean Manus, MD  Active Self, Spouse/Significant Other, Pharmacy Records  Multiple Vitamin (MULTIVITAMIN) tablet [id number] Yes Take 1 tablet by mouth in the morning. [provider]  Active Spouse/Significant Other, Self, Pharmacy Records  [facility] ULTRA test strip [id number] Yes USE 1 STRIP TO CHECK GLUCOSE ONCE DAILY [provider]  Active Spouse/Significant Other, Self, Pharmacy Records  pantoprazole  (PROTONIX ) 40 MG tablet [id number] Yes Take 1 tablet (40 mg total) by mouth daily. Pearlean Manus, MD  Active Self, Spouse/Significant Other, Pharmacy Records  spironolactone  (ALDACTONE ) 25 MG tablet [id number]  Take 0.5 tablets (12.5 mg total) by mouth daily. Maree Bracken D, DO  Active   Med List Note Worley, Stephane RAMAN, [place] 07/25/22 2036): Xeloda  filled at [facility]-discount pricing 01/30/22 Last dose            Home Care and Equipment/Supplies: Were Home Health Services Ordered?: No Any new equipment or medical supplies ordered?: No  Functional Questionnaire: Do you need assistance with bathing/showering or dressing?: No Do you need assistance with meal preparation?: No Do you need assistance with eating?: No Do you have difficulty maintaining continence: No Do you need assistance with getting out of bed/getting out of a chair/moving?: No Do you have difficulty managing or taking your medications?: No (Spouse manages for patient)  Follow up appointments reviewed: PCP Follow-up appointment confirmed?: Yes Date of PCP follow-up appointment?: 11/08/23 Follow-up Provider: Jerel Kipper Specialist [facility] Follow-up appointment confirmed?: Yes Date of Specialist follow-up appointment?: 12/17/23 Follow-Up Specialty Provider:: [facility] follow up Do you need  transportation to your follow-up appointment?: No Do you understand care options if your condition(s) worsen?: Yes-patient verbalized understanding (Spouse)  10/26/23 TOC RN CM successfully completed post discharge outreach.  Spouse conducted follow up call with TOC RN CM, patient resting.  Patient is feeling better, a bit weak from hospitalization diagnosis, appetite still not back to normal, has a soft BM this am without issues.  She appreciated call but verbally declined need for any additional calls with oncology follow ups and good communications with PCP provider per spouse.  PCP HFU scheduled for 11/08/23 per spouse.   The patient is directed to their insurance card regarding availability of benefits coverage    Bing [name] MSN, RN RN Case Manager [facility]  [facility]-Population Health Office Hours M-F [phone] Direct Dial: [phone] Main Phone [phone]  Fax: [phone] [email or web address]

## 2023-10-29 DIAGNOSIS — M79672 Pain in left foot: Secondary | ICD-10-CM | POA: Diagnosis not present

## 2023-10-29 DIAGNOSIS — L110 Acquired keratosis follicularis: Secondary | ICD-10-CM | POA: Diagnosis not present

## 2023-10-29 DIAGNOSIS — E1140 Type 2 diabetes mellitus with diabetic neuropathy, unspecified: Secondary | ICD-10-CM | POA: Diagnosis not present

## 2023-10-29 DIAGNOSIS — I739 Peripheral vascular disease, unspecified: Secondary | ICD-10-CM | POA: Diagnosis not present

## 2023-10-29 DIAGNOSIS — M79675 Pain in left toe(s): Secondary | ICD-10-CM | POA: Diagnosis not present

## 2023-10-29 DIAGNOSIS — M79674 Pain in right toe(s): Secondary | ICD-10-CM | POA: Diagnosis not present

## 2023-10-29 DIAGNOSIS — M79671 Pain in right foot: Secondary | ICD-10-CM | POA: Diagnosis not present

## 2023-11-01 DIAGNOSIS — E1122 Type 2 diabetes mellitus with diabetic chronic kidney disease: Secondary | ICD-10-CM | POA: Diagnosis not present

## 2023-11-01 DIAGNOSIS — E1165 Type 2 diabetes mellitus with hyperglycemia: Secondary | ICD-10-CM | POA: Diagnosis not present

## 2023-11-01 DIAGNOSIS — E7849 Other hyperlipidemia: Secondary | ICD-10-CM | POA: Diagnosis not present

## 2023-11-01 DIAGNOSIS — Z1329 Encounter for screening for other suspected endocrine disorder: Secondary | ICD-10-CM | POA: Diagnosis not present

## 2023-11-01 DIAGNOSIS — N182 Chronic kidney disease, stage 2 (mild): Secondary | ICD-10-CM | POA: Diagnosis not present

## 2023-11-09 DIAGNOSIS — E113213 Type 2 diabetes mellitus with mild nonproliferative diabetic retinopathy with macular edema, bilateral: Secondary | ICD-10-CM | POA: Diagnosis not present

## 2023-11-19 NOTE — Progress Notes (Unsigned)
 Cardiology Office Note   Date:  11/20/2023  ID:  [name] [name], DOB 1940/10/25, MRN [id number] PCP: Toribio Jerel MATSU, MD  [facility] Providers Cardiologist:  Jayson Sierras, MD     History of Present Illness [name] [name] is a 83 y.o. male with a past medical history of lower extremity edema paroxysmal atrial fibrillation, CAD status post DES to circumflex in 2019, HLD, mild calcification of the aortic valve with AS, hypertension here for follow-up appointment.  He was last seen about 6 months ago and did not have any exertional chest pain or DOE at that time.  No palpitations.  Did have some lower extremity edema left was worse than right, compression stockings had been helpful.  He has not been using these consistently.  His medications were reviewed at that time and compliance was reported with no specific concerns.  We refilled his Aldactone .  No spontaneous bleeding problems with Eliquis  5 twice daily.  Lab work was reviewed.  Today, he atrial fibrillation and heart murmur who presents for follow-up after recent hospitalization for abdominal pain.  He experienced abdominal pain, loose stools, and back pain leading to hospitalization. During the hospital stay, he received IV fluids, which improved his symptoms. A CT scan revealed a small nonobstructive kidney stone, a 6x7 mm nodule in the left lung, and aortic atherosclerosis. No changes were made to his medication regimen.  He recalls a previous discussion about a small amount of fluid around his heart, but no further investigation was necessary. Since discharge, he has not experienced atrial fibrillation, chest pain, or shortness of breath.  He has increased his water  intake to 64 ounces per day, resulting in improved gastrointestinal symptoms. He monitors his blood pressure at home and undergoes regular echocardiograms, with the last one performed in May 2024.  Reports no shortness of breath nor dyspnea on exertion. Reports no  chest pain, pressure, or tightness. No edema, orthopnea, PND. Reports no palpitations.   Discussed the use of AI scribe software for clinical note transcription with the patient, who gave verbal consent to proceed.   ROS: pertinent ROS in HPI  Studies Reviewed      IMPRESSIONS     1. Left ventricular ejection fraction, by estimation, is 70 to 75%. The  left ventricle has hyperdynamic function. The left ventricle has no  regional wall motion abnormalities. There is severe left ventricular  hypertrophy. Left ventricular diastolic  parameters are consistent with Grade I diastolic dysfunction (impaired  relaxation). Elevated left atrial pressure. The average left ventricular  global longitudinal strain is -20.0 %. The global longitudinal strain is  normal.   2. Right ventricular systolic function is normal. The right ventricular  size is normal. Tricuspid regurgitation signal is inadequate for assessing  PA pressure.   3. Left atrial size was severely dilated.   4. A small pericardial effusion is present. The pericardial effusion is  circumferential. There is no evidence of cardiac tamponade.   5. The mitral valve is normal in structure. No evidence of mitral valve  regurgitation. No evidence of mitral stenosis.   6. The aortic valve is tricuspid. There is moderate calcification of the  aortic valve. There is moderate thickening of the aortic valve. Aortic  valve regurgitation is not visualized. Mild aortic valve stenosis.   7. The inferior vena cava is normal in size with greater than 50%  respiratory variability, suggesting right atrial pressure of 3 mmHg.   FINDINGS   Left Ventricle: Left ventricular ejection  fraction, by estimation, is 70  to 75%. The left ventricle has hyperdynamic function. The left ventricle  has no regional wall motion abnormalities. The average left ventricular  global longitudinal strain is -20.0   %. The global longitudinal strain is normal. The left  ventricular  internal cavity size was normal in size. There is severe left ventricular  hypertrophy. Left ventricular diastolic parameters are consistent with  Grade I diastolic dysfunction (impaired  relaxation). Elevated left atrial pressure.   Right Ventricle: The right ventricular size is normal. Right vetricular  wall thickness was not well visualized. Right ventricular systolic  function is normal. Tricuspid regurgitation signal is inadequate for  assessing PA pressure.   Left Atrium: Left atrial size was severely dilated.   Right Atrium: Right atrial size was normal in size.   Pericardium: A small pericardial effusion is present. The pericardial  effusion is circumferential. There is no evidence of cardiac tamponade.   Mitral Valve: The mitral valve is normal in structure. No evidence of  mitral valve regurgitation. No evidence of mitral valve stenosis.   Tricuspid Valve: The tricuspid valve is normal in structure. Tricuspid  valve regurgitation is trivial. No evidence of tricuspid stenosis.   Aortic Valve: The aortic valve is tricuspid. There is moderate  calcification of the aortic valve. There is moderate thickening of the  aortic valve. There is moderate aortic valve annular calcification. Aortic  valve regurgitation is not visualized. Mild  aortic stenosis is present. Aortic valve mean gradient measures 11.0 mmHg.  Aortic valve peak gradient measures 18.8 mmHg. Aortic valve area, by VTI  measures 1.94 cm.   Pulmonic Valve: The pulmonic valve was not well visualized. Pulmonic valve  regurgitation is mild. No evidence of pulmonic stenosis.   Aorta: The aortic root is normal in size and structure.   Venous: The inferior vena cava is normal in size with greater than 50%  respiratory variability, suggesting right atrial pressure of 3 mmHg.   IAS/Shunts: No atrial level shunt detected by color flow Doppler.    Risk Assessment/Calculations  CHA2DS2-VASc Score = 5   This indicates a 7.2% annual risk of stroke. The patient's score is based upon: CHF History: 0 HTN History: 1 Diabetes History: 1 Stroke History: 0 Vascular Disease History: 1 Age Score: 2 Gender Score: 0       Physical Exam VS:  BP (!) 142/78   Pulse (!) 57   Ht 6' (1.829 m)   Wt 210 lb (95.3 kg)   SpO2 98%   BMI 28.48 kg/m        Wt Readings from Last 3 Encounters:  11/20/23 210 lb (95.3 kg)  10/23/23 209 lb 10.5 oz (95.1 kg)  09/13/23 219 lb 9.6 oz (99.6 kg)    GEN: Well nourished, well developed in no acute distress NECK: No JVD; No carotid bruits CARDIAC: RRR, no murmurs, rubs, gallops RESPIRATORY:  Clear to auscultation without rales, wheezing or rhonchi  ABDOMEN: Soft, non-tender, non-distended EXTREMITIES:  No edema; No deformity   ASSESSMENT AND PLAN  Atrial fibrillation No recent episodes, no chest pain or dyspnea. -continue current medications with Eliquis , cardizem , and metoprolol    Heart murmur Chronic heart murmur, no recent echocardiogram. - Order echocardiogram at [facility] before follow-up with Dr. Debera.  Aortic atherosclerosis Noted on CT scan, managed with statin therapy.  Hyperlipidemia Cholesterol levels well-managed, triglycerides, HDL, and total cholesterol within normal limits. LDL not reviewed this visit.  Hypertension Slightly elevated blood pressure, not significantly concerning. -  Monitor blood pressure at home daily and maintain a log for review at next appointment.  Mild calcific aortic stenosis -update echo -systolic murmur on exam which is chronic     Dispo: He can follow-up in [place] with Dr. Debera in three months.  Signed, Orren LOISE Fabry, PA-C

## 2023-11-20 ENCOUNTER — Ambulatory Visit: Attending: Physician Assistant | Admitting: Physician Assistant

## 2023-11-20 VITALS — BP 142/78 | HR 57 | Ht 72.0 in | Wt 210.0 lb

## 2023-11-20 DIAGNOSIS — I350 Nonrheumatic aortic (valve) stenosis: Secondary | ICD-10-CM

## 2023-11-20 DIAGNOSIS — I480 Paroxysmal atrial fibrillation: Secondary | ICD-10-CM | POA: Diagnosis not present

## 2023-11-20 DIAGNOSIS — I25119 Atherosclerotic heart disease of native coronary artery with unspecified angina pectoris: Secondary | ICD-10-CM

## 2023-11-20 DIAGNOSIS — R011 Cardiac murmur, unspecified: Secondary | ICD-10-CM

## 2023-11-20 DIAGNOSIS — Z79899 Other long term (current) drug therapy: Secondary | ICD-10-CM | POA: Diagnosis not present

## 2023-11-20 DIAGNOSIS — I10 Essential (primary) hypertension: Secondary | ICD-10-CM

## 2023-11-20 NOTE — Patient Instructions (Signed)
 Medication Instructions:  Your physician recommends that you continue on your current medications as directed. Please refer to the Current Medication list given to you today.  *If you need a refill on your cardiac medications before your next appointment, please call your pharmacy*  Lab Work: NONE If you have labs (blood work) drawn today and your tests are completely normal, you will receive your results only by: MyChart Message (if you have MyChart) OR A paper copy in the mail If you have any lab test that is abnormal or we need to change your treatment, we will call you to review the results.  Testing/Procedures: Your physician has requested that you have an echocardiogram. Echocardiography is a painless test that uses sound waves to create images of your heart. It provides your doctor with information about the size and shape of your heart and how well your heart's chambers and valves are working. This procedure takes approximately one hour. There are no restrictions for this procedure. Please do NOT wear cologne, perfume, aftershave, or lotions (deodorant is allowed). Please arrive 15 minutes prior to your appointment time.  Please note: We ask at that you not bring children with you during ultrasound (echo/ vascular) testing. Due to room size and safety concerns, children are not allowed in the ultrasound rooms during exams. Our front office staff cannot provide observation of children in our lobby area while testing is being conducted. An adult accompanying a patient to their appointment will only be allowed in the ultrasound room at the discretion of the ultrasound technician under special circumstances. We apologize for any inconvenience.   Follow-Up: At [facility], you and your health needs are our priority.  As part of our continuing mission to provide you with exceptional heart care, our providers are all part of one team.  This team includes your primary Cardiologist  (physician) and Advanced Practice Providers or APPs (Physician Assistants and Nurse Practitioners) who all work together to provide you with the care you need, when you need it.  Your next appointment:   3 month(s)  Provider:   You may see Jayson Sierras, MD or the following Advanced Practice Provider on your designated Care Team:   Almarie Crate, NP   We recommend signing up for the patient portal called MyChart.  Sign up information is provided on this After Visit Summary.  MyChart is used to connect with patients for Virtual Visits (Telemedicine).  Patients are able to view lab/test results, encounter notes, upcoming appointments, etc.  Non-urgent messages can be sent to your provider as well.   To learn more about what you can do with MyChart, go to [email or web address].

## 2023-11-24 DIAGNOSIS — N182 Chronic kidney disease, stage 2 (mild): Secondary | ICD-10-CM | POA: Diagnosis not present

## 2023-11-24 DIAGNOSIS — I10 Essential (primary) hypertension: Secondary | ICD-10-CM | POA: Diagnosis not present

## 2023-11-24 DIAGNOSIS — E782 Mixed hyperlipidemia: Secondary | ICD-10-CM | POA: Diagnosis not present

## 2023-11-24 DIAGNOSIS — M1000 Idiopathic gout, unspecified site: Secondary | ICD-10-CM | POA: Diagnosis not present

## 2023-12-06 ENCOUNTER — Ambulatory Visit (HOSPITAL_COMMUNITY)
Admission: RE | Admit: 2023-12-06 | Discharge: 2023-12-06 | Disposition: A | Discharge status: Home / Self Care | Source: Ambulatory Visit | Attending: Physician Assistant | Admitting: Physician Assistant

## 2023-12-06 ENCOUNTER — Ambulatory Visit: Payer: Self-pay | Admitting: Physician Assistant

## 2023-12-06 DIAGNOSIS — R011 Cardiac murmur, unspecified: Secondary | ICD-10-CM | POA: Diagnosis not present

## 2023-12-06 LAB — ECHOCARDIOGRAM COMPLETE
AR max vel: 2.28 cm2
AV Area VTI: 2.37 cm2
AV Area mean vel: 2.45 cm2
AV Mean grad: 7 mmHg
AV Peak grad: 13.7 mmHg
Ao pk vel: 1.85 m/s
Area-P 1/2: 1.46 cm2
S' Lateral: 2.5 cm

## 2023-12-06 NOTE — Progress Notes (Signed)
*  PRELIMINARY RESULTS* Echocardiogram 2D Echocardiogram has been performed.  [name] [name] 12/06/2023, 11:44 AM

## 2023-12-14 ENCOUNTER — Other Ambulatory Visit: Payer: Self-pay

## 2023-12-14 DIAGNOSIS — D509 Iron deficiency anemia, unspecified: Secondary | ICD-10-CM

## 2023-12-14 DIAGNOSIS — C184 Malignant neoplasm of transverse colon: Secondary | ICD-10-CM

## 2023-12-14 DIAGNOSIS — E113213 Type 2 diabetes mellitus with mild nonproliferative diabetic retinopathy with macular edema, bilateral: Secondary | ICD-10-CM | POA: Diagnosis not present

## 2023-12-17 ENCOUNTER — Inpatient Hospital Stay: Attending: Hematology

## 2023-12-17 DIAGNOSIS — D509 Iron deficiency anemia, unspecified: Secondary | ICD-10-CM | POA: Diagnosis not present

## 2023-12-17 DIAGNOSIS — Z801 Family history of malignant neoplasm of trachea, bronchus and lung: Secondary | ICD-10-CM | POA: Insufficient documentation

## 2023-12-17 DIAGNOSIS — Z9049 Acquired absence of other specified parts of digestive tract: Secondary | ICD-10-CM | POA: Insufficient documentation

## 2023-12-17 DIAGNOSIS — Z85038 Personal history of other malignant neoplasm of large intestine: Secondary | ICD-10-CM | POA: Diagnosis not present

## 2023-12-17 DIAGNOSIS — Z87891 Personal history of nicotine dependence: Secondary | ICD-10-CM | POA: Diagnosis not present

## 2023-12-17 DIAGNOSIS — Z8042 Family history of malignant neoplasm of prostate: Secondary | ICD-10-CM | POA: Insufficient documentation

## 2023-12-17 DIAGNOSIS — C184 Malignant neoplasm of transverse colon: Secondary | ICD-10-CM

## 2023-12-17 DIAGNOSIS — Z8546 Personal history of malignant neoplasm of prostate: Secondary | ICD-10-CM | POA: Insufficient documentation

## 2023-12-17 LAB — COMPREHENSIVE METABOLIC PANEL WITH GFR
ALT: 18 U/L (ref 0–44)
AST: 23 U/L (ref 15–41)
Albumin: 4.1 g/dL (ref 3.5–5.0)
Alkaline Phosphatase: 73 U/L (ref 38–126)
Anion gap: 8 (ref 5–15)
BUN: 20 mg/dL (ref 8–23)
CO2: 23 mmol/L (ref 22–32)
Calcium: 9.3 mg/dL (ref 8.9–10.3)
Chloride: 105 mmol/L (ref 98–111)
Creatinine, Ser: 0.98 mg/dL (ref 0.61–1.24)
GFR, Estimated: >60 mL/min (ref >60)
Glucose, Bld: 143 mg/dL — ABNORMAL HIGH (ref 70–99)
Potassium: 4.1 mmol/L (ref 3.5–5.1)
Sodium: 136 mmol/L (ref 135–145)
Total Bilirubin: 0.6 mg/dL (ref 0.0–1.2)
Total Protein: 7 g/dL (ref 6.5–8.1)

## 2023-12-17 LAB — CBC WITH DIFFERENTIAL/PLATELET
Abs Immature Granulocytes: 0.03 K/uL (ref 0.00–0.07)
Basophils Absolute: 0.1 K/uL (ref 0.0–0.1)
Basophils Relative: 1 %
Eosinophils Absolute: 0.1 K/uL (ref 0.0–0.5)
Eosinophils Relative: 1 %
HCT: 38.8 % — ABNORMAL LOW (ref 39.0–52.0)
Hemoglobin: 12.9 g/dL — ABNORMAL LOW (ref 13.0–17.0)
Immature Granulocytes: 0 %
Lymphocytes Relative: 18 %
Lymphs Abs: 1.2 K/uL (ref 0.7–4.0)
MCH: 32.4 pg (ref 26.0–34.0)
MCHC: 33.2 g/dL (ref 30.0–36.0)
MCV: 97.5 fL (ref 80.0–100.0)
Monocytes Absolute: 0.5 K/uL (ref 0.1–1.0)
Monocytes Relative: 7 %
Neutro Abs: 5.1 K/uL (ref 1.7–7.7)
Neutrophils Relative %: 73 %
Platelets: 157 K/uL (ref 150–400)
RBC: 3.98 MIL/uL — ABNORMAL LOW (ref 4.22–5.81)
RDW: 13.7 % (ref 11.5–15.5)
WBC: 6.9 K/uL (ref 4.0–10.5)
nRBC: 0 % (ref 0.0–0.2)

## 2023-12-17 LAB — FERRITIN: Ferritin: 158 ng/mL (ref 24–336)

## 2023-12-17 LAB — IRON AND TIBC
Iron: 68 ug/dL (ref 45–182)
Saturation Ratios: 25 % (ref 17.9–39.5)
TIBC: 275 ug/dL (ref 250–450)
UIBC: 207 ug/dL

## 2023-12-19 LAB — CEA: CEA: 5.8 ng/mL — ABNORMAL HIGH (ref 0.0–4.7)

## 2023-12-23 NOTE — Progress Notes (Unsigned)
 [name] [address], [place] [address]   CLINIC:  Medical Oncology/Hematology  PCP:  [name] Jerel MATSU, MD [address] [place] [address] [phone]   REASON FOR VISIT:  Follow-up for colon cancer and iron  deficiency anemia  PRIOR THERAPY: - Right hemicolectomy (05/20/2021) - Adjuvant Xeloda  x 8 cycles (07/06/2021 to 01/30/2022)  CURRENT THERAPY: Surveillance  BRIEF ONCOLOGIC HISTORY:  CANCER STAGING: Cancer Staging  Colon cancer [facility]) Staging form: Colon and Rectum, AJCC 8th Edition - Clinical stage from 03/22/2021: Stage I (cT1, cN0, cM0) - Unsigned - Pathologic stage from 06/23/2021: Stage IIIA (pT1, pN1b, cM0) - Unsigned   INTERVAL HISTORY:   [name] [name], a 83 y.o. male, returns for routine follow-up of his history of colon cancer and his iron  deficiency anemia. [name] was last seen on 09/13/2023 by Dr. Rogers.   In the interim since last visit, he was hospitalized from 10/23/2023 through 10/25/2023 due to abdominal pain and diarrhea.  He was admitted for prerenal AKI in the setting of gastroenteritis, which had resolved at the time of discharge.  CT of abdomen/pelvis was negative for any acute inflammatory process or metastatic disease within the abdomen or pelvis.  At today's visit, he  reports feeling ***.   ***He reports ***% energy and ***% appetite.   ***He is maintaining stable weight at this time. Received INFeD  x 1 g on 09/24/2023.  He reports feeling *** after IV iron . ***Fatigue or pica ***  ***Chemotherapy side effects (fatigue, neuropathy) ***Changes in bowel habits ***Abdominal pain ***Nausea or vomiting ***Rectal bleeding or melena ***Decreased appetite ***Unintentional weight loss  ASSESSMENT & PLAN:  1.  Stage III (T1N1B) transverse colon adenocarcinoma - Colonoscopy on 10/12/2020 with 7 sessile polyps found in the transverse colon and cecum.  12 mm polyp found in the transverse colon, sessile.  Polyp was removed  with piecemeal technique using cold snare.  2 sessile polyps found in the sigmoid colon, removed with cold snare. - Pathology cecal tubular adenoma, sessile serrated polyp of the transverse colon polypectomy, adenocarcinoma arising from a tubular adenoma with high-grade dysplasia of the transverse colon polypectomy.  Sigmoid colon polypectomy showed tubular adenoma, hyperplastic polyp. - Colonoscopy on 02/25/2021 with three 3 to 5 mm polyps in the transverse colon, removed with cold snare.  One 10 mm polyp in the descending colon at 54 cm proximal to the anus. - Pathology on 02/25/2021 shows invasive adenocarcinoma, poorly differentiated, grade 3 of the descending colon polypectomy.  CDX2 positive.  Other findings include transverse colon tubular adenoma and sessile serrated adenoma. - Right hemicolectomy on 05/20/2021. - Pathology shows grade 3 adenocarcinoma, mucinous features, 1.7 cm in the transverse colon, metastatic adenocarcinoma involving 2/29 lymph nodes.  No LVI/perineural invasion.  pT1, PN 1B. - Adjuvant Xeloda  1500 mg 2 weeks on/1 week off started on 07/06/2021.  Cycle 2 on 07/27/2021, 3 tablets twice daily.  Cycle 3 on 08/26/2021, 2 tablets twice daily 2 weeks on/1 week off, dose reduced secondary to HFSR.  Cycle 4 on 09/16/2021.  Cycle 5 Xeloda  1000 mg twice daily on 10/08/2021.  Completed cycle 8 on 01/30/2022. - PET scan on 11/24/2021: Mesenteric adenopathy is non-FDG avid. Omental thickening is also not FDG avid. No new sites.  - CTAP on 02/21/2023 showed lymph node in the central pelvis measuring 16 mm, previously 11 mm.  Right lower quadrant prostatic lymph node measures 11 mm, previously 10 mm. - Most recent CTAP from 09/06/2023: Stable mild  mesenteric lymphadenopathy with no new or progressive findings.  Stable IPMN in the pancreas. - Most recent colonoscopy (07/24/2022 by Dr. Wilhelmenia): Polyps x 4 (hyperplastic polyps).  Internal and external hemorrhoids.  Patent end-to-side ileocolonic  anastomosis with healthy-appearing mucosa. - *** Symptoms *** - Most recent labs (12/17/2023): CEA is stable around 5.8.  LFTs normal. - PLAN: Every 6 months CTAP with contrast due 03/07/2024. - Labs in 3 months to include CEA, CBC, CMP. - OFFICE visit in 3 months (after labs and CT scan) - if labs and imaging are stable at that time, we will switch to every 2-month follow-up visits and annual imaging.  *** - He will be due for repeat colonoscopy in 2027. NCCN SURVIVORSHIP & SURVEILLANCE GUIDELINES: Stage II & Stage III Colon Cancer: History and physical exam: Every 3 to 6 months x 2 years Then every 6 months for total of 5 years CEA monitoring:  Every 3 to 6 months x 2 years Then every 6 months for total of 5 years CT A/P (+/- chest) every 6 to 12 months from date of surgery for total of 5 years. Colonoscopy: 1 year after surgery (except if no complete preoperative colonoscopy, then colonoscopy in 3 to 6 months). If advanced adenoma, repeat in 1 year. If no advanced adenoma, repeat in 3 years, then every 5 years. PET/CT is not indicated for routine survivorship surveillance   2.  Normocytic anemia: - Anemia since 2019, with ferritin persistently <100 - Most recent colonoscopy (07/24/2022 by Dr. Wilhelmenia): Polyps x 4 (hyperplastic polyps).  Internal and external hemorrhoids.  Patent end-to-side ileocolonic anastomosis with healthy-appearing mucosa. - Small bowel endoscopy (07/24/2022): No evidence of recurrent tubulovillous adenoma in jejunum.  No gross lesions or bleeding. - Iron  tablets discontinued due to side effects and lack of improvement - Received INFeD  x 1 g on 09/24/2023 - Most recent labs (12/17/2023): Hgb 12.9/MCV 97.5, normal creatinine 0.98.  Ferritin 158, iron  saturation 25%. - PLAN: No indication for IV iron  at this time.  Repeat iron  panel with follow-up in 3 months.  3.  Prostate cancer: - He was diagnosed with prostate cancer in August 2007, status post seed implants.   His PSA has been undetectable since then.  4.  Social/family history: He lives at home with his wife.  He quit smoking 23 years ago.  He smoked 1 pack/day for 7 years.  He worked in textile's prior to retirement.  He is active and walks 2 miles per day. - Father had prostate cancer.  Brother had lung cancer.  Another brother had colon cancer (? colon), Sister with lung cancer, another sister with cancer, patient unknown.   PLAN SUMMARY: >> CT abdomen/pelvis with contrast around 03/07/2024 >> Labs in 3 months = CBC/D, CMP, CEA, ferritin, iron /TIBC >> OFFICE visit in 3 months (1 week after labs) ***    REVIEW OF SYSTEMS: ***  Review of Systems - Oncology  PHYSICAL EXAM:   Performance status (ECOG): {CHL ONC ED:[id number]} *** There were no vitals filed for this visit. Wt Readings from Last 3 Encounters:  11/20/23 210 lb (95.3 kg)  10/23/23 209 lb 10.5 oz (95.1 kg)  09/13/23 219 lb 9.6 oz (99.6 kg)   Physical Exam   PAST MEDICAL/SURGICAL HISTORY:  Past Medical History:  Diagnosis Date   Anemia    Arthritis    CAD (coronary artery disease)    DES to circumflex 12/2017   Colon cancer (HCC) 2023   Diabetes mellitus without complication (HCC)  Dysrhythmia    GERD (gastroesophageal reflux disease)    Glaucoma    Gout    Headache    History of kidney stones    Hypertension    NSTEMI (non-ST elevated myocardial infarction) (HCC) 12/31/2017   Paroxysmal atrial fibrillation (HCC)    Pericardial effusion    Idiopathic, recurrent pericardial effusion s/p pericardial window.   Prostate cancer (HCC) 2007   S/P Seed implants    Supraventricular tachycardia    Temporal arteritis [facility])    Past Surgical History:  Procedure Laterality Date   BIOPSY  10/12/2020   Procedure: BIOPSY;  Surgeon: Eartha Angelia Sieving, MD;  [facility];  Service: Gastroenterology;;   BIOPSY  02/25/2021   Procedure: BIOPSY;  Surgeon: Eartha Angelia Sieving, MD;  [facility];   Service: Gastroenterology;;   BIOPSY  04/11/2021   Procedure: BIOPSY;  Surgeon: Wilhelmenia Aloha Raddle., MD;  Location: [facility];  Service: Gastroenterology;;   CATARACT EXTRACTION W/PHACO Left 06/07/2015   Procedure: CATARACT EXTRACTION PHACO AND INTRAOCULAR LENS PLACEMENT LEFT EYE CDE=8.00;  Surgeon: Cherene Mania, MD;  Location: [facility];  Service: Ophthalmology;  Laterality: Left;   CATARACT EXTRACTION W/PHACO Right 06/17/2015   Procedure: CATARACT EXTRACTION PHACO AND INTRAOCULAR LENS PLACEMENT RIGHT EYE CDE=11.09;  Surgeon: Cherene Mania, MD;  Location: [facility];  Service: Ophthalmology;  Laterality: Right;   COLONOSCOPY WITH PROPOFOL  N/A 10/12/2020   Procedure: COLONOSCOPY WITH PROPOFOL ;  Surgeon: Eartha Angelia Sieving, MD;  [facility];  Service: Gastroenterology;  Laterality: N/A;  10:35   COLONOSCOPY WITH PROPOFOL  N/A 02/25/2021   Procedure: COLONOSCOPY WITH PROPOFOL ;  Surgeon: Eartha Angelia Sieving, MD;  [facility];  Service: Gastroenterology;  Laterality: N/A;  8:10   COLONOSCOPY WITH PROPOFOL  N/A 07/24/2022   Procedure: COLONOSCOPY WITH PROPOFOL ;  Surgeon: Wilhelmenia Aloha Raddle., MD;  Location: [facility];  Service: Gastroenterology;  Laterality: N/A;   CORONARY ANGIOPLASTY WITH STENT PLACEMENT  01/01/2018   CORONARY STENT INTERVENTION N/A 01/01/2018   Procedure: CORONARY STENT INTERVENTION;  Surgeon: Dann Candyce RAMAN, MD;  [facility]: [facility];  Service: Cardiovascular;  Laterality: N/A;   ENDOSCOPIC MUCOSAL RESECTION N/A 04/11/2021   Procedure: ENDOSCOPIC MUCOSAL RESECTION;  Surgeon: Wilhelmenia Aloha Raddle., MD;  [facility]: [facility];  Service: Gastroenterology;  Laterality: N/A;   ENTEROSCOPY N/A 10/12/2020   Procedure: PUSH ENTEROSCOPY;  Surgeon: Eartha Angelia Sieving, MD;  [facility];  Service: Gastroenterology;  Laterality: N/A;   ENTEROSCOPY N/A 04/11/2021   Procedure: ENTEROSCOPY;  Surgeon: Wilhelmenia Aloha Raddle., MD;   Location: [facility];  Service: Gastroenterology;  Laterality: N/A;   ENTEROSCOPY N/A 07/24/2022   Procedure: ENTEROSCOPY;  Surgeon: Wilhelmenia Aloha Raddle., MD;  Location: [facility];  Service: Gastroenterology;  Laterality: N/A;   ESOPHAGOGASTRODUODENOSCOPY (EGD) WITH PROPOFOL  N/A 10/12/2020   Procedure: ESOPHAGOGASTRODUODENOSCOPY (EGD) WITH PROPOFOL ;  Surgeon: Eartha Angelia Sieving, MD;  [facility];  Service: Gastroenterology;  Laterality: N/A;   FRACTURE SURGERY Left 2010   ankle   HEMOSTASIS CLIP PLACEMENT  04/11/2021   Procedure: HEMOSTASIS CLIP PLACEMENT;  Surgeon: Wilhelmenia Aloha Raddle., MD;  Location: [facility];  Service: Gastroenterology;;   INSERTION PROSTATE RADIATION SEED  2007   KNEE ARTHROSCOPY Left    LAPAROSCOPY N/A 05/22/2021   Procedure: LAPAROSCOPY DIAGNOSTIC WITH WASHOUT OF ABDOMEN;  Surgeon: Debby Hila, MD;  Location: [facility];  Service: General;  Laterality: N/A;   LEFT HEART CATH AND CORONARY ANGIOGRAPHY N/A 01/01/2018   Procedure: LEFT HEART CATH AND  CORONARY ANGIOGRAPHY;  Surgeon: Dann Candyce RAMAN, MD;  Location: [facility];  Service: Cardiovascular;  Laterality: N/A;   ORIF TIBIA & FIBULA FRACTURES Left 2003   Distal tibial/fibula    PERICARDIAL WINDOW  02/2007   PERICARDIOCENTESIS  2007   hx/notes 10/19/2011   POLYPECTOMY  10/12/2020   Procedure: POLYPECTOMY;  Surgeon: Eartha Angelia Sieving, MD;  [facility];  Service: Gastroenterology;;  small bowel, cecal   POLYPECTOMY  02/25/2021   Procedure: POLYPECTOMY;  Surgeon: Eartha Angelia Sieving, MD;  [facility];  Service: Gastroenterology;;  transverse colon x2   POLYPECTOMY  07/24/2022   Procedure: POLYPECTOMY;  Surgeon: Mansouraty, Aloha Raddle., MD;  Location: [facility];  Service: Gastroenterology;;   ROBLEY MEYER INJECTION  04/11/2021   Procedure: SUBMUCOSAL LIFTING INJECTION;  Surgeon: Wilhelmenia Aloha Raddle., MD;  Location: [facility];   Service: Gastroenterology;;   SUBMUCOSAL TATTOO INJECTION  04/11/2021   Procedure: SUBMUCOSAL TATTOO INJECTION;  Surgeon: Wilhelmenia Aloha Raddle., MD;  Location: [facility];  Service: Gastroenterology;;    SOCIAL HISTORY:  Social History   Socioeconomic History   Marital status: Married    Spouse name: Not on file   Number of children: Not on file   Years of education: Not on file   Highest education level: Not on file  Occupational History   Not on file  Tobacco Use   Smoking status: Former    Current packs/day: 0.00    Average packs/day: 0.3 packs/day for 10.0 years (3.0 ttl pk-yrs)    Types: Cigarettes    Start date: 08/06/1958    Quit date: 1968    Years since quitting: 57.7    Passive exposure: Past   Smokeless tobacco: Never  Vaping Use   Vaping status: Never Used  Substance and Sexual Activity   Alcohol  use: Not Currently   Drug use: Never   Sexual activity: Yes  Other Topics Concern   Not on file  Social History Narrative   Not on file   Social Drivers of Health   Financial Resource Strain: Not on file  Food Insecurity: No Food Insecurity (10/23/2023)   Hunger Vital Sign    Worried About Running Out of Food in the Last Year: Never true    Ran Out of Food in the Last Year: Never true  Transportation Needs: No Transportation Needs (10/23/2023)   PRAPARE - Administrator, Civil Service (Medical): No    Lack of Transportation (Non-Medical): No  Physical Activity: Not on file  Stress: Not on file  Social Connections: Socially Integrated (10/23/2023)   Social Connection and Isolation Panel    Frequency of Communication with Friends and Family: More than three times a week    Frequency of Social Gatherings with Friends and Family: More than three times a week    Attends Religious Services: More than 4 times per year    Active Member of [employer] or Organizations: Yes    Attends Engineer, structural: More than 4 times per year    Marital Status:  Married  Catering manager Violence: Not At Risk (10/23/2023)   Humiliation, Afraid, Rape, and Kick questionnaire    Fear of Current or Ex-Partner: No    Emotionally Abused: No    Physically Abused: No    Sexually Abused: No    FAMILY HISTORY:  Family History  Problem Relation Age of Onset   CAD Mother        MI in 23s  Prostate cancer Father    Prostate cancer Brother    Lung cancer Brother     CURRENT MEDICATIONS:  Current Outpatient Medications  Medication Sig Dispense Refill   Acetaminophen  (TYLENOL  8 HOUR PO) Take 2 tablets by mouth daily as needed (pain).     allopurinol  (ZYLOPRIM ) 300 MG tablet Take 450 mg by mouth in the morning.     apixaban  (ELIQUIS ) 5 MG TABS tablet Take 1 tablet (5 mg total) by mouth 2 (two) times daily for 2 days. 60 tablet 6   atorvastatin  (LIPITOR ) 80 MG tablet Take 1 tablet (80 mg total) by mouth daily. 90 tablet 2   benazepril  (LOTENSIN ) 40 MG tablet Take 1 tablet (40 mg total) by mouth daily. 30 tablet 0   Blood Glucose Monitoring Suppl (ONETOUCH VERIO FLEX SYSTEM) w/Device KIT USE 1 TO CHECK GLUCOSE ONCE DAILY     brimonidine (ALPHAGAN) 0.2 % ophthalmic solution Place 1 drop into both eyes 2 (two) times daily.     cyanocobalamin  1000 MCG tablet Take 1 tablet (1,000 mcg total) by mouth daily.     diltiazem  (CARDIZEM  CD) 240 MG 24 hr capsule Take 240 mg by mouth daily.     dorzolamide  (TRUSOPT ) 2 % ophthalmic solution Place 1 drop into both eyes 2 (two) times daily.     hydrALAZINE  (APRESOLINE ) 100 MG tablet Take 1 tablet (100 mg total) by mouth 3 (three) times daily. 270 tablet 1   Lancets (ONETOUCH DELICA PLUS LANCET33G) MISC SMARTSIG:1 Topical Daily     loperamide (IMODIUM) 2 MG capsule Take 2 mg by mouth as needed for diarrhea or loose stools.     metFORMIN  (GLUCOPHAGE -XR) 500 MG 24 hr tablet Take 500 mg by mouth 2 (two) times daily with a meal.     metoprolol  succinate (TOPROL -XL) 25 MG 24 hr tablet Take 1 tablet (25 mg total) by mouth daily.  30 tablet 3   Multiple Vitamin (MULTIVITAMIN) tablet Take 1 tablet by mouth in the morning.     ONETOUCH ULTRA test strip USE 1 STRIP TO CHECK GLUCOSE ONCE DAILY     pantoprazole  (PROTONIX ) 40 MG tablet Take 1 tablet (40 mg total) by mouth daily. 90 tablet 1   spironolactone  (ALDACTONE ) 25 MG tablet Take 0.5 tablets (12.5 mg total) by mouth daily. 45 tablet 1   No current facility-administered medications for this visit.   Facility-Administered Medications Ordered in Other Visits  Medication Dose Route Frequency Provider Last Rate Last Admin   lactated ringers  infusion   Intravenous Continuous PRN Judythe Tanda Aran, CRNA   Stopped at 05/11/22 1355    ALLERGIES:  Allergies  Allergen Reactions   Amiodarone  Rash    LABORATORY DATA:  I have reviewed the labs as listed.     Latest Ref Rng & Units 12/17/2023    9:52 AM 10/25/2023    4:20 AM 10/24/2023    4:44 AM  CBC  WBC 4.0 - 10.5 K/uL 6.9  7.6  7.7   Hemoglobin 13.0 - 17.0 g/dL 87.0  88.1  88.0   Hematocrit 39.0 - 52.0 % 38.8  36.6  37.0   Platelets 150 - 400 K/uL 157  120  125       Latest Ref Rng & Units 12/17/2023    9:52 AM 10/25/2023    4:20 AM 10/24/2023    4:44 AM  CMP  Glucose 70 - 99 mg/dL 856  843  842   BUN 8 - 23 mg/dL 20  17  23  Creatinine 0.61 - 1.24 mg/dL 9.01  8.78  8.34   Sodium 135 - 145 mmol/L 136  137  140   Potassium 3.5 - 5.1 mmol/L 4.1  3.8  3.8   Chloride 98 - 111 mmol/L 105  109  109   CO2 22 - 32 mmol/L 23  21  21    Calcium  8.9 - 10.3 mg/dL 9.3  8.7  9.0   Total Protein 6.5 - 8.1 g/dL 7.0     Total Bilirubin 0.0 - 1.2 mg/dL 0.6     Alkaline Phos 38 - 126 U/L 73     AST 15 - 41 U/L 23     ALT 0 - 44 U/L 18       DIAGNOSTIC IMAGING:  I have independently reviewed the scans and discussed with the patient. ECHOCARDIOGRAM COMPLETE Result Date: 12/06/2023    ECHOCARDIOGRAM REPORT   Patient Name:   [name] [name] Date of Exam: 12/06/2023 Medical Rec #:  [id number]     Height:       72.0 in  Accession #:    [id number]    Weight:       210.0 lb Date of Birth:  [birth year]     BSA:          2.175 m Patient Age:    83 years      BP:           152/64 mmHg Patient Gender: M             HR:           58 bpm. Exam Location:  [facility] Procedure: 2D Echo, Cardiac Doppler, Color Doppler and Strain Analysis (Both            Spectral and Color Flow Doppler were utilized during procedure). Indications:    Murmur R01.1  History:        Patient has prior history of Echocardiogram examinations, most                 recent 07/26/2022. CAD and Previous Myocardial Infarction,                 Arrythmias:Atrial Fibrillation; Risk Factors:Hypertension,                 Diabetes, Dyslipidemia and Former Smoker.  Sonographer:    Aida Pizza RCS Referring Phys: [phone] TESSA N CONTE  Sonographer Comments: Global longitudinal strain was attempted. IMPRESSIONS  1. Left ventricular ejection fraction, by estimation, is 60 to 65%. The left ventricle has normal function. The left ventricle has no regional wall motion abnormalities. There is severe concentric left ventricular hypertrophy. Left ventricular diastolic  parameters are consistent with Grade I diastolic dysfunction (impaired relaxation). Elevated left ventricular end-diastolic pressure. The average left ventricular global longitudinal strain is -12.2 %.  2. Right ventricular systolic function is normal. The right ventricular size is normal. Tricuspid regurgitation signal is inadequate for assessing PA pressure.  3. Left atrial size was severely dilated.  4. Moderate to large circumferential pericardial effusion. Stable compared to 2024 study. There is no evidence of cardiac tamponade.  5. The mitral valve is abnormal. Trivial mitral valve regurgitation. No evidence of mitral stenosis. Severe mitral annular calcification.  6. The aortic valve is tricuspid. Aortic valve regurgitation is trivial. Aortic valve sclerosis/calcification is present, without any evidence of aortic  stenosis. Aortic valve area, by VTI measures 2.37 cm. Aortic valve mean gradient measures 7.0 mmHg.  Aortic valve Vmax measures 1.85  m/s.  7. Aortic dilatation noted. There is mild dilatation of the aortic root, measuring 40 mm.  8. The inferior vena cava is normal in size with greater than 50% respiratory variability, suggesting right atrial pressure of 3 mmHg.  9. Increased flow velocities may be secondary to anemia, thyrotoxicosis, hyperdynamic or high flow state. FINDINGS  Left Ventricle: Left ventricular ejection fraction, by estimation, is 60 to 65%. The left ventricle has normal function. The left ventricle has no regional wall motion abnormalities. The average left ventricular global longitudinal strain is -12.2 %. Strain was performed and the global longitudinal strain is indeterminate. The left ventricular internal cavity size was normal in size. There is severe concentric left ventricular hypertrophy. Left ventricular diastolic parameters are consistent with Grade I diastolic dysfunction (impaired relaxation). Elevated left ventricular end-diastolic pressure. Right Ventricle: The right ventricular size is normal. No increase in right ventricular wall thickness. Right ventricular systolic function is normal. Tricuspid regurgitation signal is inadequate for assessing PA pressure. Left Atrium: Left atrial size was severely dilated. Right Atrium: Right atrial size was normal in size. Pericardium: Moderate to large. The pericardial effusion is circumferential. There is no evidence of cardiac tamponade. Mitral Valve: The mitral valve is abnormal. Severe mitral annular calcification. Trivial mitral valve regurgitation. No evidence of mitral valve stenosis. Tricuspid Valve: The tricuspid valve is normal in structure. Tricuspid valve regurgitation is trivial. No evidence of tricuspid stenosis. Aortic Valve: The aortic valve is tricuspid. Aortic valve regurgitation is trivial. Aortic valve sclerosis/calcification  is present, without any evidence of aortic stenosis. Aortic valve mean gradient measures 7.0 mmHg. Aortic valve peak gradient measures 13.7 mmHg. Aortic valve area, by VTI measures 2.37 cm. Pulmonic Valve: The pulmonic valve was normal in structure. Pulmonic valve regurgitation is not visualized. No evidence of pulmonic stenosis. Aorta: Aortic dilatation noted. There is mild dilatation of the aortic root, measuring 40 mm. Venous: The inferior vena cava is normal in size with greater than 50% respiratory variability, suggesting right atrial pressure of 3 mmHg. IAS/Shunts: No atrial level shunt detected by color flow Doppler. Additional Comments: 3D was performed not requiring image post processing on an independent workstation and was indeterminate.  LEFT VENTRICLE PLAX 2D LVIDd:         4.50 cm   Diastology LVIDs:         2.50 cm   LV e' medial:    7.34 cm/s LV PW:         1.50 cm   LV E/e' medial:  12.1 LV IVS:        1.40 cm   LV e' lateral:   5.35 cm/s LVOT diam:     1.90 cm   LV E/e' lateral: 16.6 LV SV:         105 LV SV Index:   48        2D Longitudinal Strain LVOT Area:     2.84 cm  2D Strain GLS Avg:     -12.2 %  RIGHT VENTRICLE RV S prime:     13.90 cm/s TAPSE (M-mode): 1.9 cm LEFT ATRIUM              Index        RIGHT ATRIUM           Index LA diam:        4.70 cm  2.16 cm/m   RA Area:     22.10 cm LA Vol (A2C):   129.0 ml 59.30 ml/m  RA Volume:  60.00 ml  27.58 ml/m LA Vol (A4C):   143.0 ml 65.74 ml/m LA Biplane Vol: 138.0 ml 63.44 ml/m  AORTIC VALVE AV Area (Vmax):    2.28 cm AV Area (Vmean):   2.45 cm AV Area (VTI):     2.37 cm AV Vmax:           185.00 cm/s AV Vmean:          118.000 cm/s AV VTI:            0.441 m AV Peak Grad:      13.7 mmHg AV Mean Grad:      7.0 mmHg LVOT Vmax:         149.00 cm/s LVOT Vmean:        102.000 cm/s LVOT VTI:          0.369 m LVOT/AV VTI ratio: 0.84  AORTA Ao Root diam: 4.00 cm MITRAL VALVE MV Area (PHT): 1.46 cm     SHUNTS MV Decel Time: 521 msec      Systemic VTI:  0.37 m MV E velocity: 88.60 cm/s   Systemic Diam: 1.90 cm MV A velocity: 140.00 cm/s MV E/A ratio:  0.63 Vishnu Priya Mallipeddi Electronically signed by Diannah Late Mallipeddi Signature Date/Time: 12/06/2023/11:59:48 AM    Final      WRAP UP:  All questions were answered. The patient knows to call the clinic with any problems, questions or concerns.  Medical decision making: ***  Time spent on visit: I spent {CHL ONC TIME VISIT - DTPQU:[id number]} counseling the patient face to face. The total time spent in the appointment was {CHL ONC TIME VISIT - DTPQU:[id number]} and more than 50% was on counseling.  Pleasant CHRISTELLA Barefoot, PA-C  ***

## 2023-12-24 ENCOUNTER — Inpatient Hospital Stay (HOSPITAL_BASED_OUTPATIENT_CLINIC_OR_DEPARTMENT_OTHER): Admitting: Physician Assistant

## 2023-12-24 DIAGNOSIS — Z87891 Personal history of nicotine dependence: Secondary | ICD-10-CM | POA: Diagnosis not present

## 2023-12-24 DIAGNOSIS — Z85038 Personal history of other malignant neoplasm of large intestine: Secondary | ICD-10-CM | POA: Diagnosis not present

## 2023-12-24 DIAGNOSIS — C184 Malignant neoplasm of transverse colon: Secondary | ICD-10-CM

## 2023-12-24 DIAGNOSIS — Z9049 Acquired absence of other specified parts of digestive tract: Secondary | ICD-10-CM | POA: Diagnosis not present

## 2023-12-24 DIAGNOSIS — D509 Iron deficiency anemia, unspecified: Secondary | ICD-10-CM | POA: Diagnosis not present

## 2023-12-24 DIAGNOSIS — Z801 Family history of malignant neoplasm of trachea, bronchus and lung: Secondary | ICD-10-CM | POA: Diagnosis not present

## 2023-12-24 NOTE — Patient Instructions (Signed)
 [facility] at [facility] **VISIT SUMMARY & IMPORTANT INSTRUCTIONS **   You were seen today by Pleasant Barefoot PA-C for your follow-up visit.    HISTORY OF COLON CANCER Your most recent labs and CT scan did not show any evidence of recurrent colon cancer at this time. You will be due for repeat CT in December 2025. We will check repeat labs and see you for office visit in 3 months.  IRON  DEFICIENCY ANEMIA Your blood and iron  levels look much better after IV iron !  FOLLOW-UP APPOINTMENT: 3 months  ** Thank you for trusting me with your healthcare!  I strive to provide all of my patients with quality care at each visit.  If you receive a survey for this visit, I would be so grateful to you for taking the time to provide feedback.  Thank you in advance!  ~ Elianis Fischbach                                        Dr. Mickiel Davonna Pleasant Barefoot, PA-C          Delon Hope, NP   - - - - - - - - - - - - - - - - - -    Thank you for choosing [facility] at [facility] to provide your oncology and hematology care.  To afford each patient quality time with our provider, please arrive at least 15 minutes before your scheduled appointment time.   If you have a lab appointment with the Cancer Center please come in thru the Main Entrance and check in at the main information desk.  You need to re-schedule your appointment should you arrive 10 or more minutes late.  We strive to give you quality time with our providers, and arriving late affects you and other patients whose appointments are after yours.  Also, if you no show three or more times for appointments you may be dismissed from the clinic at the providers discretion.     Again, thank you for choosing [facility].  Our hope is that these requests will decrease the amount of time that you wait before being seen by our physicians.        _____________________________________________________________  Should you have questions after your visit to [facility], please contact our office at [phone] and follow the prompts.  Our office hours are 8:00 a.m. and 4:30 p.m. Monday - Friday.  Please note that voicemails left after 4:00 p.m. may not be returned until the following business day.  We are closed weekends and major holidays.  You do have access to a nurse 24-7, just call the main number to the clinic [phone] and do not press any options, hold on the line and a nurse will answer the phone.    For prescription refill requests, have your pharmacy contact our office and allow 72 hours.

## 2024-01-08 DIAGNOSIS — M79675 Pain in left toe(s): Secondary | ICD-10-CM | POA: Diagnosis not present

## 2024-01-08 DIAGNOSIS — E1140 Type 2 diabetes mellitus with diabetic neuropathy, unspecified: Secondary | ICD-10-CM | POA: Diagnosis not present

## 2024-01-08 DIAGNOSIS — M79674 Pain in right toe(s): Secondary | ICD-10-CM | POA: Diagnosis not present

## 2024-01-08 DIAGNOSIS — L110 Acquired keratosis follicularis: Secondary | ICD-10-CM | POA: Diagnosis not present

## 2024-01-08 DIAGNOSIS — I739 Peripheral vascular disease, unspecified: Secondary | ICD-10-CM | POA: Diagnosis not present

## 2024-01-08 DIAGNOSIS — M79672 Pain in left foot: Secondary | ICD-10-CM | POA: Diagnosis not present

## 2024-01-08 DIAGNOSIS — M79671 Pain in right foot: Secondary | ICD-10-CM | POA: Diagnosis not present

## 2024-01-09 ENCOUNTER — Encounter (INDEPENDENT_AMBULATORY_CARE_PROVIDER_SITE_OTHER): Payer: Self-pay | Admitting: Gastroenterology

## 2024-01-18 DIAGNOSIS — E113213 Type 2 diabetes mellitus with mild nonproliferative diabetic retinopathy with macular edema, bilateral: Secondary | ICD-10-CM | POA: Diagnosis not present

## 2024-02-11 ENCOUNTER — Ambulatory Visit: Attending: Cardiology | Admitting: Cardiology

## 2024-02-11 ENCOUNTER — Encounter: Payer: Self-pay | Admitting: Cardiology

## 2024-02-11 VITALS — BP 124/60 | HR 59 | Ht 71.0 in | Wt 213.0 lb

## 2024-02-11 DIAGNOSIS — I10 Essential (primary) hypertension: Secondary | ICD-10-CM

## 2024-02-11 DIAGNOSIS — I25119 Atherosclerotic heart disease of native coronary artery with unspecified angina pectoris: Secondary | ICD-10-CM | POA: Diagnosis not present

## 2024-02-11 DIAGNOSIS — E782 Mixed hyperlipidemia: Secondary | ICD-10-CM

## 2024-02-11 DIAGNOSIS — I480 Paroxysmal atrial fibrillation: Secondary | ICD-10-CM | POA: Diagnosis not present

## 2024-02-11 DIAGNOSIS — I3139 Other pericardial effusion (noninflammatory): Secondary | ICD-10-CM

## 2024-02-11 NOTE — Progress Notes (Signed)
 Cardiology Office Note  Date: 02/11/2024   ID: [name] [name], DOB [birth year], MRN [id number]  History of Present Illness: [name] [name] is an 83 y.o. male last seen in August by Ms. Lucien RIGGERS, I reviewed the note.  He is here with his wife for a follow-up visit.  He does not report any exertional chest pain or palpitations, no syncope.  He has had some trouble with his balance and uses a cane, no recent falls.  I reviewed the echocardiogram obtained back in September.  Results are outlined below.  We went over his medications today.  He reports compliance with therapy, will have lab work with Dr. Toribio in December.  I rechecked his blood pressure today at 124/60.  Physical Exam: VS:  BP 124/60 (BP Location: Left Arm)   Pulse (!) 59   Ht 5' 11 (1.803 m)   Wt 213 lb (96.6 kg)   SpO2 97%   BMI 29.71 kg/m , BMI Body mass index is 29.71 kg/m.  Wt Readings from Last 3 Encounters:  02/11/24 213 lb (96.6 kg)  12/24/23 216 lb 14.9 oz (98.4 kg)  11/20/23 210 lb (95.3 kg)    General: Patient appears comfortable at rest. HEENT: Conjunctiva and lids normal. Neck: Supple, no elevated JVP or carotid bruits. Lungs: Clear to auscultation, nonlabored breathing at rest. Cardiac: Regular rate and rhythm, no S3, 1/6 systolic murmur. Extremities: No pitting edema.  ECG:  An ECG dated 11/20/2023 was personally reviewed today and demonstrated:  Sinus bradycardia with left anterior fascicular block and right bundle branch block.  Labwork: 10/23/2023: B Natriuretic Peptide 215.0 10/25/2023: Magnesium  1.9 12/17/2023: ALT 18; AST 23; BUN 20; Creatinine, Ser 0.98; Hemoglobin 12.9; Platelets 157; Potassium 4.1; Sodium 136   Other Studies Reviewed Today:  Echocardiogram 12/06/2023:  1. Left ventricular ejection fraction, by estimation, is 60 to 65%. The  left ventricle has normal function. The left ventricle has no regional  wall motion abnormalities. There is severe concentric left ventricular   hypertrophy. Left ventricular diastolic   parameters are consistent with Grade I diastolic dysfunction (impaired  relaxation). Elevated left ventricular end-diastolic pressure. The average  left ventricular global longitudinal strain is -12.2 %.   2. Right ventricular systolic function is normal. The right ventricular  size is normal. Tricuspid regurgitation signal is inadequate for assessing  PA pressure.   3. Left atrial size was severely dilated.   4. Moderate to large circumferential pericardial effusion. Stable  compared to 2024 study. There is no evidence of cardiac tamponade.   5. The mitral valve is abnormal. Trivial mitral valve regurgitation. No  evidence of mitral stenosis. Severe mitral annular calcification.   6. The aortic valve is tricuspid. Aortic valve regurgitation is trivial.  Aortic valve sclerosis/calcification is present, without any evidence of  aortic stenosis. Aortic valve area, by VTI measures 2.37 cm. Aortic valve  mean gradient measures 7.0 mmHg.   Aortic valve Vmax measures 1.85 m/s.   7. Aortic dilatation noted. There is mild dilatation of the aortic root,  measuring 40 mm.   8. The inferior vena cava is normal in size with greater than 50%  respiratory variability, suggesting right atrial pressure of 3 mmHg.   9. Increased flow velocities may be secondary to anemia, thyrotoxicosis,  hyperdynamic or high flow state.   Assessment and Plan:  1.  Paroxysmal atrial fibrillation with CHA2DS2-VASc score of 3.  He does not report any worsening palpitations and continues on Eliquis  5 mg twice  daily for stroke prophylaxis.  Continue Cardizem  CD 240 mg daily and Toprol -XL 25 mg daily.   2.  CAD status post DES to the circumflex in 2019.  EF 60 to 65% by echocardiogram in September.  He does not report any angina on medical therapy.  Not on aspirin  given use of Eliquis .  Continue statin therapy.   3.  Mixed hyperlipidemia.  LDL 48 in November 2023.  Continue  Lipitor  80 mg daily.  He will have follow-up lab work with PCP in December.   4.  Moderately sclerotic to mildly stenotic aortic valve, mean AV gradient 7 mmHg by recent echocardiogram and trivial aortic regurgitation.   5.  Primary hypertension.  Continue current regimen which also includes Aldactone  12.5 mg daily, hydralazine  100 mg 3 times a day, and Lotensin  40 mg daily.  6.  Chronic idiopathic pericardial effusion status post pericardial window with reaccumulation.  He is clinically stable, recent echocardiogram in September demonstrated moderate to large pericardial effusion with no obvious evidence of tamponade physiology.  Disposition:  Follow up 6 months.  Signed, Jayson JUDITHANN Sierras, M.D., F.A.C.C. [facility] at [facility]

## 2024-02-11 NOTE — Patient Instructions (Addendum)
 Medication Instructions: Your physician recommends that you continue on your current medications as directed. Please refer to the Current Medication list given to you today.   Labwork: None ordered.   Testing/Procedures: None ordered.   Follow-Up: Your physician recommends that you schedule a follow-up appointment in: 6 Months  Any Other Special Instructions Will Be Listed Below (If Applicable).  None  If you need a refill on your cardiac medications before your next appointment, please call your pharmacy.

## 2024-03-18 ENCOUNTER — Ambulatory Visit (HOSPITAL_COMMUNITY)
Admission: RE | Admit: 2024-03-18 | Discharge: 2024-03-18 | Disposition: A | Discharge status: Home / Self Care | Source: Ambulatory Visit | Attending: Physician Assistant | Admitting: Physician Assistant

## 2024-03-18 ENCOUNTER — Inpatient Hospital Stay: Attending: Physician Assistant

## 2024-03-18 DIAGNOSIS — C184 Malignant neoplasm of transverse colon: Secondary | ICD-10-CM | POA: Insufficient documentation

## 2024-03-18 DIAGNOSIS — Z8546 Personal history of malignant neoplasm of prostate: Secondary | ICD-10-CM | POA: Insufficient documentation

## 2024-03-18 DIAGNOSIS — Z85038 Personal history of other malignant neoplasm of large intestine: Secondary | ICD-10-CM | POA: Insufficient documentation

## 2024-03-18 DIAGNOSIS — D509 Iron deficiency anemia, unspecified: Secondary | ICD-10-CM | POA: Insufficient documentation

## 2024-03-18 LAB — CBC WITH DIFFERENTIAL/PLATELET
Abs Immature Granulocytes: 0.06 K/uL (ref 0.00–0.07)
Basophils Absolute: 0.1 K/uL (ref 0.0–0.1)
Basophils Relative: 1 %
Eosinophils Absolute: 0.2 K/uL (ref 0.0–0.5)
Eosinophils Relative: 2 %
HCT: 38.1 % — ABNORMAL LOW (ref 39.0–52.0)
Hemoglobin: 12.4 g/dL — ABNORMAL LOW (ref 13.0–17.0)
Immature Granulocytes: 1 %
Lymphocytes Relative: 16 %
Lymphs Abs: 1.1 K/uL (ref 0.7–4.0)
MCH: 31.6 pg (ref 26.0–34.0)
MCHC: 32.5 g/dL (ref 30.0–36.0)
MCV: 96.9 fL (ref 80.0–100.0)
Monocytes Absolute: 0.7 K/uL (ref 0.1–1.0)
Monocytes Relative: 9 %
Neutro Abs: 5.1 K/uL (ref 1.7–7.7)
Neutrophils Relative %: 71 %
Platelets: 159 K/uL (ref 150–400)
RBC: 3.93 MIL/uL — ABNORMAL LOW (ref 4.22–5.81)
RDW: 13.7 % (ref 11.5–15.5)
WBC: 7.2 K/uL (ref 4.0–10.5)
nRBC: 0 % (ref 0.0–0.2)

## 2024-03-18 LAB — IRON AND TIBC
Iron: 99 ug/dL (ref 45–182)
Saturation Ratios: 36 % (ref 17.9–39.5)
TIBC: 274 ug/dL (ref 250–450)
UIBC: 176 ug/dL

## 2024-03-18 LAB — COMPREHENSIVE METABOLIC PANEL WITH GFR
ALT: 21 U/L (ref 0–44)
AST: 25 U/L (ref 15–41)
Albumin: 4.5 g/dL (ref 3.5–5.0)
Alkaline Phosphatase: 87 U/L (ref 38–126)
Anion gap: 12 (ref 5–15)
BUN: 21 mg/dL (ref 8–23)
CO2: 23 mmol/L (ref 22–32)
Calcium: 9.8 mg/dL (ref 8.9–10.3)
Chloride: 105 mmol/L (ref 98–111)
Creatinine, Ser: 0.94 mg/dL (ref 0.61–1.24)
GFR, Estimated: >60 mL/min
Glucose, Bld: 105 mg/dL — ABNORMAL HIGH (ref 70–99)
Potassium: 4.3 mmol/L (ref 3.5–5.1)
Sodium: 139 mmol/L (ref 135–145)
Total Bilirubin: 0.5 mg/dL (ref 0.0–1.2)
Total Protein: 6.9 g/dL (ref 6.5–8.1)

## 2024-03-18 LAB — FERRITIN: Ferritin: 156 ng/mL (ref 24–336)

## 2024-03-18 MED ORDER — IOHEXOL 9 MG/ML PO SOLN
500.0000 mL | ORAL | Status: AC
  Administered 2024-03-18: 500 mL via ORAL

## 2024-03-18 MED ORDER — IOHEXOL 9 MG/ML PO SOLN
ORAL | Status: AC
  Filled 2024-03-18: qty 1000

## 2024-03-18 MED ORDER — IOHEXOL 300 MG/ML  SOLN
100.0000 mL | Freq: Once | INTRAMUSCULAR | Status: AC | PRN
  Administered 2024-03-18: 100 mL via INTRAVENOUS

## 2024-03-19 LAB — CEA: CEA: 6.6 ng/mL — ABNORMAL HIGH (ref 0.0–4.7)

## 2024-03-24 ENCOUNTER — Encounter: Payer: Self-pay | Admitting: *Deleted

## 2024-03-25 NOTE — Progress Notes (Unsigned)
 "  [facility] [address], [place] [address]   CLINIC:  Medical Oncology/Hematology  PCP:  Toribio Jerel MATSU, MD [address] [place] [address] [phone]   REASON FOR VISIT:  Follow-up for colon cancer and iron  deficiency anemia  PRIOR THERAPY: - Right hemicolectomy (05/20/2021) - Adjuvant Xeloda  x 8 cycles (07/06/2021 to 01/30/2022)  CURRENT THERAPY: Surveillance  BRIEF ONCOLOGIC HISTORY:  CANCER STAGING:  Cancer Staging  Colon cancer [facility]) Staging form: Colon and Rectum, AJCC 8th Edition - Clinical stage from 03/22/2021: Stage I (cT1, cN0, cM0) - Unsigned - Pathologic stage from 06/23/2021: Stage IIIA (pT1, pN1b, cM0) - Unsigned   INTERVAL HISTORY:   [name] [name], a 83 y.o. male, returns for routine follow-up of his history of colon cancer and his iron  deficiency anemia. Shayn was last seen on 12/24/2023 by Pleasant Barefoot PA-C.  In the interim since last visit, he has not had any hospitalizations, surgeries, or changes in baseline health status.  *** At today's visit, he  reports feeling ***.   He reports 75***% energy and 100***% appetite.   He is maintaining stable weight at this time.***  He denies any abnormal fatigue.*** No ice pica.*** He denies any lasting side effects from cancer treatment - no fatigue or neuropathy.*** He denies any recent changes in bowel habits, abdominal pain, nausea/vomiting, rectal bleeding, or melena.*** No decreased appetite or unintentional weight loss.***  ASSESSMENT & PLAN:  1.  Stage III (T1N1B) transverse colon adenocarcinoma - Colonoscopy on 10/12/2020 with 7 sessile polyps found in the transverse colon and cecum.  12 mm polyp found in the transverse colon, sessile.  Polyp was removed with piecemeal technique using cold snare.  2 sessile polyps found in the sigmoid colon, removed with cold snare. - Pathology cecal tubular adenoma, sessile serrated polyp of the transverse colon polypectomy,  adenocarcinoma arising from a tubular adenoma with high-grade dysplasia of the transverse colon polypectomy.  Sigmoid colon polypectomy showed tubular adenoma, hyperplastic polyp. - Colonoscopy on 02/25/2021 with three 3 to 5 mm polyps in the transverse colon, removed with cold snare.  One 10 mm polyp in the descending colon at 54 cm proximal to the anus. - Pathology on 02/25/2021 shows invasive adenocarcinoma, poorly differentiated, grade 3 of the descending colon polypectomy.  CDX2 positive.  Other findings include transverse colon tubular adenoma and sessile serrated adenoma. - Right hemicolectomy on 05/20/2021. - Pathology shows grade 3 adenocarcinoma, mucinous features, 1.7 cm in the transverse colon, metastatic adenocarcinoma involving 2/29 lymph nodes.  No LVI/perineural invasion.  pT1, PN 1B. - Adjuvant Xeloda  1500 mg 2 weeks on/1 week off started on 07/06/2021.  Cycle 2 on 07/27/2021, 3 tablets twice daily.  Cycle 3 on 08/26/2021, 2 tablets twice daily 2 weeks on/1 week off, dose reduced secondary to HFSR.  Cycle 4 on 09/16/2021.  Cycle 5 Xeloda  1000 mg twice daily on 10/08/2021.  Completed cycle 8 on 01/30/2022. - PET scan on 11/24/2021: Mesenteric adenopathy is non-FDG avid. Omental thickening is also not FDG avid. No new sites.  - CTAP on 02/21/2023 showed lymph node in the central pelvis measuring 16 mm, previously 11 mm.  Right lower quadrant prostatic lymph node measures 11 mm, previously 10 mm. - CT AP from 09/06/2023: Stable mild mesenteric lymphadenopathy with no new or progressive findings.  Stable IPMN in the pancreas. - Most recent CT AP (03/18/2024): No new or progressive metastatic disease in the abdomen or pelvis.  Stable mild mesenteric  adenopathy. - Most recent colonoscopy (07/24/2022 by Dr. Wilhelmenia): Polyps x 4 (hyperplastic polyps).  Internal and external hemorrhoids.  Patent end-to-side ileocolonic anastomosis with healthy-appearing mucosa. - No changes in bowel habits, abdominal pain,  or rectal bleeding/melena*** - Most recent labs (03/18/2024): CEA is stable around 6.6.  LFTs normal. - PLAN: Since labs and CT scan are stable, we will switch to every 58-month labs/office visits with annual imaging: Labs (CEA, CBC/D, CMP) in 6 months + OFFICE visit Repeat CT A/P with contrast due 03/19/2025 - He will be due for repeat colonoscopy in 2027. [facility] SURVIVORSHIP & SURVEILLANCE GUIDELINES: Stage II & Stage III Colon Cancer: History and physical exam: Every 3 to 6 months x 2 years Then every 6 months for total of 5 years CEA monitoring:  Every 3 to 6 months x 2 years Then every 6 months for total of 5 years CT A/P (+/- chest) every 6 to 12 months from date of surgery for total of 5 years. Colonoscopy: 1 year after surgery (except if no complete preoperative colonoscopy, then colonoscopy in 3 to 6 months). If advanced adenoma, repeat in 1 year. If no advanced adenoma, repeat in 3 years, then every 5 years. PET/CT is not indicated for routine survivorship surveillance   2.  Normocytic anemia: - Anemia since 2019, with ferritin persistently <100 - Most recent colonoscopy (07/24/2022 by Dr. Wilhelmenia): Polyps x 4 (hyperplastic polyps).  Internal and external hemorrhoids.  Patent end-to-side ileocolonic anastomosis with healthy-appearing mucosa. - Small bowel endoscopy (07/24/2022): No evidence of recurrent tubulovillous adenoma in jejunum.  No gross lesions or bleeding. - Iron  tablets discontinued due to side effects and lack of improvement - Received INFeD  x 1 g on 09/24/2023 - Most recent labs (03/18/2024): Hgb 12.4/MCV 96.9, normal creatinine 0.94.  Ferritin 156, iron  saturation 36%. - PLAN: No indication for IV iron  at this time.  Repeat iron  panel with follow-up in 6 months  3.  Prostate cancer: - He was diagnosed with prostate cancer in August 2007, status post seed implants.  His PSA has been undetectable since then. -- He follows with Dr. Nicholaus ([facility])  annually  4.  Social/family history: He lives at home with his wife.  He quit smoking 23 years ago.  He smoked 1 pack/day for 7 years.  He worked in textile's prior to retirement.  He is active and walks 2 miles per day. - Father had prostate cancer.  Brother had lung cancer.  Another brother had colon cancer (? colon), Sister with lung cancer, another sister with cancer, patient unknown.   PLAN SUMMARY: >> Labs in 6 months = CBC/D, CMP, CEA, ferritin, iron /TIBC >> OFFICE visit in 6 months (1 week after labs)     REVIEW OF SYSTEMS: ***  Review of Systems  Constitutional:  Negative for appetite change, chills, diaphoresis, fatigue, fever and unexpected weight change.  HENT:   Negative for lump/mass and nosebleeds.   Eyes:  Negative for eye problems.  Respiratory:  Positive for cough. Negative for hemoptysis and shortness of breath.   Cardiovascular:  Negative for chest pain, leg swelling and palpitations.  Gastrointestinal:  Positive for diarrhea. Negative for abdominal pain, blood in stool, constipation, nausea and vomiting.  Genitourinary:  Negative for hematuria.   Skin: Negative.   Neurological:  Negative for dizziness, headaches and light-headedness.  Hematological:  Does not bruise/bleed easily.    PHYSICAL EXAM: ***  Performance status (ECOG): 0 - Asymptomatic  There were no vitals filed for this visit.  Wt Readings from Last 3 Encounters:  02/11/24 213 lb (96.6 kg)  12/24/23 216 lb 14.9 oz (98.4 kg)  11/20/23 210 lb (95.3 kg)   Physical Exam Constitutional:      Appearance: Normal appearance. He is normal weight.  Neck:     Comments: Lipoma of left anterior cervical regain, present for many years per patient report Cardiovascular:     Heart sounds: Normal heart sounds.  Pulmonary:     Breath sounds: Normal breath sounds.  Neurological:     General: No focal deficit present.     Mental Status: Mental status is at baseline.  Psychiatric:        Behavior: Behavior  normal. Behavior is cooperative.      PAST MEDICAL/SURGICAL HISTORY:  Past Medical History:  Diagnosis Date   Anemia    Arthritis    CAD (coronary artery disease)    DES to circumflex 12/2017   Colon cancer (HCC) 2023   Diabetes mellitus without complication (HCC)    Dysrhythmia    GERD (gastroesophageal reflux disease)    Glaucoma    Gout    Headache    History of kidney stones    Hypertension    NSTEMI (non-ST elevated myocardial infarction) (HCC) 12/31/2017   Paroxysmal atrial fibrillation (HCC)    Pericardial effusion    Idiopathic, recurrent pericardial effusion s/p pericardial window.   Prostate cancer (HCC) 2007   S/P Seed implants    Supraventricular tachycardia    Temporal arteritis [facility])    Past Surgical History:  Procedure Laterality Date   BIOPSY  10/12/2020   Procedure: BIOPSY;  Surgeon: Eartha Angelia Sieving, MD;  [facility];  Service: Gastroenterology;;   BIOPSY  02/25/2021   Procedure: BIOPSY;  Surgeon: Eartha Angelia Sieving, MD;  [facility];  Service: Gastroenterology;;   BIOPSY  04/11/2021   Procedure: BIOPSY;  Surgeon: Wilhelmenia Aloha Raddle., MD;  Location: [facility];  Service: Gastroenterology;;   CATARACT EXTRACTION W/PHACO Left 06/07/2015   Procedure: CATARACT EXTRACTION PHACO AND INTRAOCULAR LENS PLACEMENT LEFT EYE CDE=8.00;  Surgeon: Cherene Mania, MD;  Location: [facility];  Service: Ophthalmology;  Laterality: Left;   CATARACT EXTRACTION W/PHACO Right 06/17/2015   Procedure: CATARACT EXTRACTION PHACO AND INTRAOCULAR LENS PLACEMENT RIGHT EYE CDE=11.09;  Surgeon: Cherene Mania, MD;  [facility]: [facility];  Service: Ophthalmology;  Laterality: Right;   COLONOSCOPY WITH PROPOFOL  N/A 10/12/2020   Procedure: COLONOSCOPY WITH PROPOFOL ;  Surgeon: Eartha Angelia Sieving, MD;  [facility];  Service: Gastroenterology;  Laterality: N/A;  10:35   COLONOSCOPY WITH PROPOFOL  N/A 02/25/2021   Procedure: COLONOSCOPY WITH PROPOFOL ;   Surgeon: Eartha Angelia Sieving, MD;  [facility];  Service: Gastroenterology;  Laterality: N/A;  8:10   COLONOSCOPY WITH PROPOFOL  N/A 07/24/2022   Procedure: COLONOSCOPY WITH PROPOFOL ;  Surgeon: Mansouraty, Aloha Raddle., MD;  [facility]: [facility];  Service: Gastroenterology;  Laterality: N/A;   CORONARY ANGIOPLASTY WITH STENT PLACEMENT  01/01/2018   CORONARY STENT INTERVENTION N/A 01/01/2018   Procedure: CORONARY STENT INTERVENTION;  Surgeon: Dann Candyce RAMAN, MD;  [facility];  Service: Cardiovascular;  Laterality: N/A;   ENDOSCOPIC MUCOSAL RESECTION N/A 04/11/2021   Procedure: ENDOSCOPIC MUCOSAL RESECTION;  Surgeon: Wilhelmenia Aloha Raddle., MD;  [facility];  Service: Gastroenterology;  Laterality: N/A;   ENTEROSCOPY N/A 10/12/2020   Procedure: PUSH ENTEROSCOPY;  Surgeon: Eartha Angelia Sieving, MD;  [facility];  Service: Gastroenterology;  Laterality: N/A;   ENTEROSCOPY N/A  04/11/2021   Procedure: ENTEROSCOPY;  Surgeon: Wilhelmenia Aloha Raddle., MD;  Location: [facility];  Service: Gastroenterology;  Laterality: N/A;   ENTEROSCOPY N/A 07/24/2022   Procedure: ENTEROSCOPY;  Surgeon: Wilhelmenia Aloha Raddle., MD;  Location: [facility];  Service: Gastroenterology;  Laterality: N/A;   ESOPHAGOGASTRODUODENOSCOPY (EGD) WITH PROPOFOL  N/A 10/12/2020   Procedure: ESOPHAGOGASTRODUODENOSCOPY (EGD) WITH PROPOFOL ;  Surgeon: Eartha Angelia Sieving, MD;  [facility];  Service: Gastroenterology;  Laterality: N/A;   FRACTURE SURGERY Left 2010   ankle   HEMOSTASIS CLIP PLACEMENT  04/11/2021   Procedure: HEMOSTASIS CLIP PLACEMENT;  Surgeon: Wilhelmenia Aloha Raddle., MD;  [facility];  Service: Gastroenterology;;   INSERTION PROSTATE RADIATION SEED  2007   KNEE ARTHROSCOPY Left    LAPAROSCOPY N/A 05/22/2021   Procedure: LAPAROSCOPY DIAGNOSTIC WITH WASHOUT OF ABDOMEN;  Surgeon: Debby Hila, MD;  Location: [facility];  Service:  General;  Laterality: N/A;   LEFT HEART CATH AND CORONARY ANGIOGRAPHY N/A 01/01/2018   Procedure: LEFT HEART CATH AND CORONARY ANGIOGRAPHY;  Surgeon: Dann Candyce RAMAN, MD;  [facility];  Service: Cardiovascular;  Laterality: N/A;   ORIF TIBIA & FIBULA FRACTURES Left 2003   Distal tibial/fibula    PERICARDIAL WINDOW  02/2007   PERICARDIOCENTESIS  2007   hx/notes 10/19/2011   POLYPECTOMY  10/12/2020   Procedure: POLYPECTOMY;  Surgeon: Eartha Angelia Sieving, MD;  [facility];  Service: Gastroenterology;;  small bowel, cecal   POLYPECTOMY  02/25/2021   Procedure: POLYPECTOMY;  Surgeon: Eartha Angelia Sieving, MD;  [facility];  Service: Gastroenterology;;  transverse colon x2   POLYPECTOMY  07/24/2022   Procedure: POLYPECTOMY;  Surgeon: Mansouraty, Aloha Raddle., MD;  Location: [facility];  Service: Gastroenterology;;   ROBLEY MEYER INJECTION  04/11/2021   Procedure: SUBMUCOSAL LIFTING INJECTION;  Surgeon: Wilhelmenia Aloha Raddle., MD;  Location: [facility];  Service: Gastroenterology;;   SUBMUCOSAL TATTOO INJECTION  04/11/2021   Procedure: SUBMUCOSAL TATTOO INJECTION;  Surgeon: Wilhelmenia Aloha Raddle., MD;  Location: [facility];  Service: Gastroenterology;;    SOCIAL HISTORY:  Social History   Socioeconomic History   Marital status: Married    Spouse name: Not on file   Number of children: Not on file   Years of education: Not on file   Highest education level: Not on file  Occupational History   Not on file  Tobacco Use   Smoking status: Former    Current packs/day: 0.00    Average packs/day: 0.3 packs/day for 10.0 years (3.0 ttl pk-yrs)    Types: Cigarettes    Start date: 08/06/1958    Quit date: 1968    Years since quitting: 58.0    Passive exposure: Past   Smokeless tobacco: Never  Vaping Use   Vaping status: Never Used  Substance and Sexual Activity   Alcohol  use: Not Currently   Drug use: Never   Sexual activity:  Yes  Other Topics Concern   Not on file  Social History Narrative   Not on file   Social Drivers of Health   Tobacco Use: Medium Risk (02/11/2024)   Patient History    Smoking Tobacco Use: Former    Smokeless Tobacco Use: Never    Passive Exposure: Past  Physicist, Medical Strain: Not on file  Food Insecurity: Low Risk (01/17/2024)   Received from [facility]   Epic    Within the past 12 months, you worried that your food would run out before you got money to buy  more: Never true    Within the past 12 months, the food you bought just didn't last and you didn't have money to get more. : Never true  Transportation Needs: No Transportation Needs (01/17/2024)   Received from [facility]    In the past 12 months, has lack of reliable transportation kept you from medical appointments, meetings, work or from getting things needed for daily living? : No  Physical Activity: Not on file  Stress: Not on file  Social Connections: Socially Integrated (10/23/2023)   Social Connection and Isolation Panel    Frequency of Communication with Friends and Family: More than three times a week    Frequency of Social Gatherings with Friends and Family: More than three times a week    Attends Religious Services: More than 4 times per year    Active Member of [employer] or Organizations: Yes    Attends Banker Meetings: More than 4 times per year    Marital Status: Married  Catering Manager Violence: Not At Risk (10/23/2023)   Epic    Fear of Current or Ex-Partner: No    Emotionally Abused: No    Physically Abused: No    Sexually Abused: No  Depression (PHQ2-9): Low Risk (12/24/2023)   Depression (PHQ2-9)    PHQ-2 Score: 0  Alcohol  Screen: Not on file  Housing: Low Risk (01/17/2024)   Received from [facility]   Epic    What is your living situation today?: I have a steady place to live    Think about the place you live. Do you have problems with any of the  following? Choose all that apply:: None/None on this list  Utilities: Low Risk (01/17/2024)   Received from [facility]   Utilities    In the past 12 months has the electric, gas, oil, or water  company threatened to shut off services in your home? : No  Health Literacy: Not on file    FAMILY HISTORY:  Family History  Problem Relation Age of Onset   CAD Mother        MI in 16s   Prostate cancer Father    Prostate cancer Brother    Lung cancer Brother     CURRENT MEDICATIONS:  Current Outpatient Medications  Medication Sig Dispense Refill   Acetaminophen  (TYLENOL  8 HOUR PO) Take 2 tablets by mouth daily as needed (pain).     allopurinol  (ZYLOPRIM ) 300 MG tablet Take 450 mg by mouth in the morning.     apixaban  (ELIQUIS ) 5 MG TABS tablet Take 1 tablet (5 mg total) by mouth 2 (two) times daily for 2 days. 60 tablet 6   atorvastatin  (LIPITOR ) 80 MG tablet Take 1 tablet (80 mg total) by mouth daily. 90 tablet 2   benazepril  (LOTENSIN ) 40 MG tablet Take 1 tablet (40 mg total) by mouth daily. 30 tablet 0   Blood Glucose Monitoring Suppl (ONETOUCH VERIO FLEX SYSTEM) w/Device KIT USE 1 TO CHECK GLUCOSE ONCE DAILY     brimonidine (ALPHAGAN) 0.2 % ophthalmic solution Place 1 drop into both eyes 2 (two) times daily.     cyanocobalamin  1000 MCG tablet Take 1 tablet (1,000 mcg total) by mouth daily.     diltiazem  (CARDIZEM  CD) 240 MG 24 hr capsule Take 240 mg by mouth daily.     dorzolamide  (TRUSOPT ) 2 % ophthalmic solution Place 1 drop into both eyes 2 (two) times daily.     hydrALAZINE  (APRESOLINE ) 100 MG tablet Take  1 tablet (100 mg total) by mouth 3 (three) times daily. 270 tablet 1   Lancets (ONETOUCH DELICA PLUS LANCET33G) MISC SMARTSIG:1 Topical Daily     loperamide (IMODIUM) 2 MG capsule Take 2 mg by mouth as needed for diarrhea or loose stools.     metFORMIN  (GLUCOPHAGE -XR) 500 MG 24 hr tablet Take 500 mg by mouth 2 (two) times daily with a meal.     metoprolol  succinate (TOPROL -XL) 25  MG 24 hr tablet Take 1 tablet (25 mg total) by mouth daily. 30 tablet 3   Multiple Vitamin (MULTIVITAMIN) tablet Take 1 tablet by mouth in the morning.     ONETOUCH ULTRA test strip USE 1 STRIP TO CHECK GLUCOSE ONCE DAILY     pantoprazole  (PROTONIX ) 40 MG tablet Take 1 tablet (40 mg total) by mouth daily. 90 tablet 1   spironolactone  (ALDACTONE ) 25 MG tablet Take 0.5 tablets (12.5 mg total) by mouth daily. 45 tablet 1   No current facility-administered medications for this visit.   Facility-Administered Medications Ordered in Other Visits  Medication Dose Route Frequency Provider Last Rate Last Admin   lactated ringers  infusion   Intravenous Continuous PRN Judythe Tanda Aran, CRNA   Stopped at 05/11/22 1355    ALLERGIES:  Allergies  Allergen Reactions   Amiodarone  Rash    LABORATORY DATA:  I have reviewed the labs as listed.     Latest Ref Rng & Units 03/18/2024   11:03 AM 12/17/2023    9:52 AM 10/25/2023    4:20 AM  CBC  WBC 4.0 - 10.5 K/uL 7.2  6.9  7.6   Hemoglobin 13.0 - 17.0 g/dL 87.5  87.0  88.1   Hematocrit 39.0 - 52.0 % 38.1  38.8  36.6   Platelets 150 - 400 K/uL 159  157  120       Latest Ref Rng & Units 03/18/2024   11:03 AM 12/17/2023    9:52 AM 10/25/2023    4:20 AM  CMP  Glucose 70 - 99 mg/dL 894  856  843   BUN 8 - 23 mg/dL 21  20  17    Creatinine 0.61 - 1.24 mg/dL 9.05  9.01  8.78   Sodium 135 - 145 mmol/L 139  136  137   Potassium 3.5 - 5.1 mmol/L 4.3  4.1  3.8   Chloride 98 - 111 mmol/L 105  105  109   CO2 22 - 32 mmol/L 23  23  21    Calcium  8.9 - 10.3 mg/dL 9.8  9.3  8.7   Total Protein 6.5 - 8.1 g/dL 6.9  7.0    Total Bilirubin 0.0 - 1.2 mg/dL 0.5  0.6    Alkaline Phos 38 - 126 U/L 87  73    AST 15 - 41 U/L 25  23    ALT 0 - 44 U/L 21  18      DIAGNOSTIC IMAGING:  I have independently reviewed the scans and discussed with the patient. CT ABDOMEN PELVIS W CONTRAST Result Date: 03/25/2024 EXAM: CT ABDOMEN AND PELVIS WITH CONTRAST 03/18/2024  12:30:37 PM TECHNIQUE: CT of the abdomen and pelvis was performed with the administration of intravenous contrast. Multiplanar reformatted images are provided for review. Automated exposure control, iterative reconstruction, and/or weight-based adjustment of the mA/kV was utilized to reduce the radiation dose to as low as reasonably achievable. COMPARISON: CT abdomen/pelvis dated 10/23/2023. CLINICAL HISTORY: Colon cancer, stage II/III, monitor. * Tracking Code: BO * FINDINGS: LOWER CHEST: Solid 0.5  cm posterior left lower lobe pulmonary nodule on series 2/image 2, stable. Ground glass posterior right middle lobe 0.4 cm nodule on image 1, stable. No acute abnormality at the lung bases. LIVER: Innumerable subcentimeter hypodense liver lesions scattered throughout the liver, too small to characterize, unchanged, presumably benign. No new or enlarging liver lesions. GALLBLADDER AND BILE DUCTS: Cholecystectomy. No biliary ductal dilatation. SPLEEN: No acute abnormality. PANCREAS: Pancreatic body 1.2 cm cystic lesion on series 8/image 23, stable. Otherwise normal pancreas. No pancreatic duct dilation. ADRENAL GLANDS: No acute abnormality. KIDNEYS, URETERS AND BLADDER: Simple 3.0 cm lower left renal cyst. Nonobstructing 2 mm upper left renal stone. No hydronephrosis. No perinephric or periureteral stranding. Urinary bladder is unremarkable. GI AND BOWEL: Status post right hemicolectomy with ileocolic anastomosis. No dilated or thick walled small bowel loops. Oral contrast transits to the pelvic small bowel. No large bowel wall thickening in the remnant left colon. Minimal left colonic diverticulosis. PERITONEUM AND RETROPERITONEUM: No ascites. No free air. VASCULATURE: Atherosclerotic nonaneurysmal abdominal aorta. LYMPH NODES: Mildly enlarged 1.5 cm right mesenteric node on series 8/image 55, previously 1.6 cm, not appreciably changed. Mildly enlarged 1.1 cm right mesenteric node on image 46, previously 1.0 cm, not  appreciably changed. REPRODUCTIVE ORGANS: No acute abnormality. BONES AND SOFT TISSUES: Mild thoracolumbar spondylosis. No acute osseous abnormality. No focal soft tissue abnormality. HEART AND MEDIASTINUM: Coronary atherosclerosis. Chronic moderate pericardial effusion, unchanged. IMPRESSION: 1. No new or progressive metastatic disease in the abdomen or pelvis. Stable mild mesenteric adenopathy. 2. Stable postsurgical changes from right hemicolectomy with ileocolic anastomosis. 3. Stable small pulmonary nodules at the lung bases. 4. Chronic findings: Coronary atherosclerosis. Chronic moderate pericardial effusion, unchanged. Stable 1.2 cm pancreatic body cystic lesion, without high-risk features. Nonobstructing 2 mm upper left renal stone. 5. Aortic Atherosclerosis (ICD10-I70.0). Electronically signed by: Selinda Blue MD 03/25/2024 03:51 PM EST RP Workstation: [id number]     WRAP UP:  All questions were answered. The patient knows to call the clinic with any problems, questions or concerns.  Medical decision making: Moderate  Time spent on visit: I spent 20 minutes counseling the patient face to face. The total time spent in the appointment was 30 minutes and more than 50% was on counseling.  Pleasant CHRISTELLA Barefoot, PA-C  *** "

## 2024-03-26 ENCOUNTER — Inpatient Hospital Stay: Admitting: Physician Assistant

## 2024-03-26 DIAGNOSIS — D509 Iron deficiency anemia, unspecified: Secondary | ICD-10-CM

## 2024-03-26 DIAGNOSIS — C184 Malignant neoplasm of transverse colon: Secondary | ICD-10-CM | POA: Diagnosis not present

## 2024-03-26 DIAGNOSIS — Z85038 Personal history of other malignant neoplasm of large intestine: Secondary | ICD-10-CM | POA: Diagnosis not present

## 2024-03-26 NOTE — Patient Instructions (Signed)
 [facility] at [facility] **VISIT SUMMARY & IMPORTANT INSTRUCTIONS **   You were seen today by Pleasant Barefoot PA-C for your follow-up visit.    HISTORY OF COLON CANCER Your most recent labs and CT scan did not show any evidence of recurrent colon cancer at this time. We will check repeat labs and see you for office visit in 6 months. You will be due for repeat CT in December 2026.  IRON  DEFICIENCY ANEMIA Your blood and iron  levels look much better after IV iron !  FOLLOW-UP APPOINTMENT: 6 months  ** Thank you for trusting me with your healthcare!  I strive to provide all of my patients with quality care at each visit.  If you receive a survey for this visit, I would be so grateful to you for taking the time to provide feedback.  Thank you in advance!  ~ Riel Hirschman                                        Dr. Mickiel Davonna Pleasant Barefoot, PA-C          Delon Hope, NP   - - - - - - - - - - - - - - - - - -    Thank you for choosing [facility] at [facility] to provide your oncology and hematology care.  To afford each patient quality time with our provider, please arrive at least 15 minutes before your scheduled appointment time.   If you have a lab appointment with the Cancer Center please come in thru the Main Entrance and check in at the main information desk.  You need to re-schedule your appointment should you arrive 10 or more minutes late.  We strive to give you quality time with our providers, and arriving late affects you and other patients whose appointments are after yours.  Also, if you no show three or more times for appointments you may be dismissed from the clinic at the providers discretion.     Again, thank you for choosing [facility].  Our hope is that these requests will decrease the amount of time that you wait before being seen by our physicians.        _____________________________________________________________  Should you have questions after your visit to [facility], please contact our office at [phone] and follow the prompts.  Our office hours are 8:00 a.m. and 4:30 p.m. Monday - Friday.  Please note that voicemails left after 4:00 p.m. may not be returned until the following business day.  We are closed weekends and major holidays.  You do have access to a nurse 24-7, just call the main number to the clinic [phone] and do not press any options, hold on the line and a nurse will answer the phone.    For prescription refill requests, have your pharmacy contact our office and allow 72 hours.

## 2024-09-22 ENCOUNTER — Inpatient Hospital Stay

## 2024-09-29 ENCOUNTER — Inpatient Hospital Stay: Admitting: Physician Assistant
